# Patient Record
Sex: Female | Born: 1935
Health system: Southern US, Community
[De-identification: ages and names within clinical notes are randomized; demographics above are authoritative.]

## PROBLEM LIST (undated history)

## (undated) DIAGNOSIS — K219 Gastro-esophageal reflux disease without esophagitis: Secondary | ICD-10-CM

## (undated) DIAGNOSIS — F329 Major depressive disorder, single episode, unspecified: Secondary | ICD-10-CM

## (undated) DIAGNOSIS — R0600 Dyspnea, unspecified: Secondary | ICD-10-CM

## (undated) DIAGNOSIS — I639 Cerebral infarction, unspecified: Secondary | ICD-10-CM

## (undated) DIAGNOSIS — Z974 Presence of external hearing-aid: Secondary | ICD-10-CM

## (undated) DIAGNOSIS — F32A Depression, unspecified: Secondary | ICD-10-CM

## (undated) DIAGNOSIS — I5189 Other ill-defined heart diseases: Secondary | ICD-10-CM

## (undated) DIAGNOSIS — H9190 Unspecified hearing loss, unspecified ear: Secondary | ICD-10-CM

## (undated) DIAGNOSIS — J189 Pneumonia, unspecified organism: Secondary | ICD-10-CM

## (undated) DIAGNOSIS — M1990 Unspecified osteoarthritis, unspecified site: Secondary | ICD-10-CM

## (undated) DIAGNOSIS — I509 Heart failure, unspecified: Secondary | ICD-10-CM

## (undated) DIAGNOSIS — F419 Anxiety disorder, unspecified: Secondary | ICD-10-CM

## (undated) DIAGNOSIS — I10 Essential (primary) hypertension: Secondary | ICD-10-CM

## (undated) DIAGNOSIS — I499 Cardiac arrhythmia, unspecified: Secondary | ICD-10-CM

## (undated) DIAGNOSIS — E785 Hyperlipidemia, unspecified: Secondary | ICD-10-CM

## (undated) DIAGNOSIS — Z87442 Personal history of urinary calculi: Secondary | ICD-10-CM

## (undated) DIAGNOSIS — R011 Cardiac murmur, unspecified: Secondary | ICD-10-CM

## (undated) HISTORY — PX: JOINT REPLACEMENT: SHX530

## (undated) HISTORY — DX: Other ill-defined heart diseases: I51.89

## (undated) HISTORY — PX: SHOULDER SURGERY: SHX246

## (undated) HISTORY — DX: Hyperlipidemia, unspecified: E78.5

## (undated) HISTORY — DX: Essential (primary) hypertension: I10

## (undated) HISTORY — DX: Heart failure, unspecified: I50.9

## (undated) HISTORY — PX: OTHER SURGICAL HISTORY: SHX169

## (undated) HISTORY — PX: APPENDECTOMY: SHX54

## (undated) HISTORY — DX: Dyspnea, unspecified: R06.00

## (undated) HISTORY — DX: Unspecified osteoarthritis, unspecified site: M19.90

---

## 1999-02-05 ENCOUNTER — Encounter: Admission: RE | Admit: 1999-02-05 | Discharge: 1999-03-09 | Payer: Self-pay | Admitting: Orthopedic Surgery

## 2000-08-03 ENCOUNTER — Encounter: Admission: RE | Admit: 2000-08-03 | Discharge: 2000-08-04 | Payer: Self-pay | Admitting: Orthopedic Surgery

## 2000-10-19 ENCOUNTER — Encounter: Admission: RE | Admit: 2000-10-19 | Discharge: 2000-10-21 | Payer: Self-pay | Admitting: Orthopedic Surgery

## 2000-11-21 ENCOUNTER — Encounter: Admission: RE | Admit: 2000-11-21 | Discharge: 2000-11-24 | Payer: Self-pay | Admitting: Orthopedic Surgery

## 2001-05-09 ENCOUNTER — Ambulatory Visit (HOSPITAL_COMMUNITY): Admission: RE | Admit: 2001-05-09 | Discharge: 2001-05-09 | Payer: Self-pay | Admitting: Gastroenterology

## 2003-05-29 ENCOUNTER — Encounter: Admission: RE | Admit: 2003-05-29 | Discharge: 2003-05-29 | Payer: Self-pay | Admitting: Urology

## 2003-05-29 ENCOUNTER — Encounter: Payer: Self-pay | Admitting: Urology

## 2003-05-30 ENCOUNTER — Ambulatory Visit (HOSPITAL_COMMUNITY): Admission: RE | Admit: 2003-05-30 | Discharge: 2003-05-30 | Payer: Self-pay | Admitting: Urology

## 2003-05-30 ENCOUNTER — Encounter (INDEPENDENT_AMBULATORY_CARE_PROVIDER_SITE_OTHER): Payer: Self-pay | Admitting: Specialist

## 2003-05-30 ENCOUNTER — Ambulatory Visit (HOSPITAL_BASED_OUTPATIENT_CLINIC_OR_DEPARTMENT_OTHER): Admission: RE | Admit: 2003-05-30 | Discharge: 2003-05-30 | Payer: Self-pay | Admitting: Urology

## 2004-10-21 ENCOUNTER — Ambulatory Visit (HOSPITAL_COMMUNITY): Admission: RE | Admit: 2004-10-21 | Discharge: 2004-10-21 | Payer: Self-pay | Admitting: Gastroenterology

## 2007-04-21 ENCOUNTER — Encounter: Admission: RE | Admit: 2007-04-21 | Discharge: 2007-04-21 | Payer: Self-pay | Admitting: Urology

## 2007-04-25 ENCOUNTER — Ambulatory Visit (HOSPITAL_BASED_OUTPATIENT_CLINIC_OR_DEPARTMENT_OTHER): Admission: RE | Admit: 2007-04-25 | Discharge: 2007-04-25 | Payer: Self-pay | Admitting: Urology

## 2007-05-27 ENCOUNTER — Emergency Department (HOSPITAL_COMMUNITY): Admission: EM | Admit: 2007-05-27 | Discharge: 2007-05-28 | Payer: Self-pay | Admitting: Emergency Medicine

## 2007-10-31 ENCOUNTER — Ambulatory Visit (HOSPITAL_BASED_OUTPATIENT_CLINIC_OR_DEPARTMENT_OTHER): Admission: RE | Admit: 2007-10-31 | Discharge: 2007-10-31 | Payer: Self-pay | Admitting: Urology

## 2007-11-14 ENCOUNTER — Ambulatory Visit (HOSPITAL_BASED_OUTPATIENT_CLINIC_OR_DEPARTMENT_OTHER): Admission: RE | Admit: 2007-11-14 | Discharge: 2007-11-14 | Payer: Self-pay | Admitting: Urology

## 2008-04-11 ENCOUNTER — Other Ambulatory Visit: Admission: RE | Admit: 2008-04-11 | Discharge: 2008-04-11 | Payer: Self-pay | Admitting: Interventional Radiology

## 2008-04-11 ENCOUNTER — Encounter (INDEPENDENT_AMBULATORY_CARE_PROVIDER_SITE_OTHER): Payer: Self-pay | Admitting: Interventional Radiology

## 2008-04-11 ENCOUNTER — Encounter: Admission: RE | Admit: 2008-04-11 | Discharge: 2008-04-11 | Payer: Self-pay | Admitting: Internal Medicine

## 2008-05-16 ENCOUNTER — Encounter: Admission: RE | Admit: 2008-05-16 | Discharge: 2008-05-16 | Payer: Self-pay | Admitting: Orthopedic Surgery

## 2008-05-20 ENCOUNTER — Ambulatory Visit (HOSPITAL_BASED_OUTPATIENT_CLINIC_OR_DEPARTMENT_OTHER): Admission: RE | Admit: 2008-05-20 | Discharge: 2008-05-20 | Payer: Self-pay | Admitting: Orthopedic Surgery

## 2009-07-29 ENCOUNTER — Encounter: Admission: RE | Admit: 2009-07-29 | Discharge: 2009-08-21 | Payer: Self-pay | Admitting: Podiatry

## 2010-05-15 ENCOUNTER — Ambulatory Visit: Payer: Self-pay | Admitting: Cardiovascular Disease

## 2010-11-20 ENCOUNTER — Ambulatory Visit: Payer: Self-pay | Admitting: Cardiovascular Disease

## 2011-01-19 NOTE — Op Note (Signed)
[name], [name]              ACCOUNT NO.:  [id number]   MEDICAL RECORD NO.:  [id number]          PATIENT TYPE:  AMB   LOCATION:  [facility]                         FACILITY:  [facility]   PHYSICIAN:  Martina Sinner, MD DATE OF BIRTH:  1928-05-29   DATE OF PROCEDURE:  04/25/2007  DATE OF DISCHARGE:                               OPERATIVE REPORT   PRE AND POSTOPERATIVE DIAGNOSIS:  Stress incontinence.   SURGERY:  Sling cystourethropexy plus cystoscopy.   Carrie Best was prepped and draped in usual fashion.  She was given  preoperative antibiotics.  Extra care was taken to position her legs to  minimize compartment syndrome, neuropathy and DVT risks.   Two 1 cm incisions were made one fingerbreadth above the symphysis  pubis, 1.5 cm lateral to the midline.  I made a 2 cm incision overlying  mid urethra.  7 mL of epinephrine lidocaine were used for a thick flap  and improve hemostasis.  I made a nice thick incision.  I dissected  sharply to urethrovesical angle bilaterally.   With the bladder emptied, I passed a SPARC needle on top of and then  along the back of the symphysis pubis, parallel to the midline on the  pulp of my left index finger bilaterally.  I then cystoscoped the  patient.  There is efflux of indigo carmine from both ureteral orifices.  There is no bladder or urethral injury.  There is no deflection of the  bladder with movement of the trocar.   With the bladder empty I then attached the sheath and brought up the  sheath through the retropubic space.  I tensioned nicely over the fat  part of a medium-size Kelly clamp.  I cut below the blue dot to irrigate  the sheath and removed the sheaths.  I was very happy with the mid  urethral position.  There is hypermobility of the sling in the midline  as well as laterally.  It was not under tension.  After irrigating I  closed the anterior vaginal wall incision with running 2-0 Vicryl  followed by a second suture.  I  closed the abdominal incisions with  subcuticular 4-0 Vicryl and Dermabond.   [name] will get a trial of voiding.  Hopefully her urge symptoms  will settle down postoperatively.           ______________________________  Martina Sinner, MD  Electronically Signed     SAM/MEDQ  D:  04/25/2007  T:  04/26/2007  Job:  [id number]

## 2011-01-19 NOTE — Op Note (Signed)
NAMEJENESE, MISCHKE              ACCOUNT NO.:  [id number]   MEDICAL RECORD NO.:  [id number]          PATIENT TYPE:  AMB   LOCATION:  [facility]                         FACILITY:  [facility]   PHYSICIAN:  Martina Sinner, MD DATE OF BIRTH:  1927/11/11   DATE OF PROCEDURE:  DATE OF DISCHARGE:                               OPERATIVE REPORT   PREOPERATIVE DIAGNOSIS:  Refractory urge incontinence.   POSTOPERATIVE DIAGNOSIS:  Refractory urge incontinence.   SURGERY:  InterStim stage II placement of generator.   SURGEON:  Martina Sinner, MD   ASSISTANT:  Dr. Delman Kitten.   HISTORY:  Ariba Lehnen has had reasonable results from her InterStim  stage I.  I think she has gone from urge incontinence with foot on the  floor syndrome to a lot of urgency but she is making it in time.  We  were going to remove the lead but over the last 2 weeks we have decided  to place the generator.  I am concerned that she is having some memory  issues.   The patient was prepped and draped in the usual fashion.  A 10 minute  scrub was used.  Because of allergies I gave IV vancomycin.   The 2.5 cm incision was opened a total of approximately 4.5 to 5 cm.  Extra care was taken not to cut the lead entering the pocket.  The lead  connector with lead was removed from the wound.  Silk sutures were cut.  Boot was removed and the lead connector was disconnected from the lead.  We cut the lead and removed it from the body not affecting our sterile  field.   The lead was cleaned and connected to the Medtronic generator.  Screwdriver was utilized.  The tip of the lead was appropriately placed  far enough near the generator.  The generator easily fit into the pocket  that was made a little bit larger with finger dissection.  It was  approximately 1 cm or greater in depth.   The wound was irrigated with antibiotics.  We closed the subcutaneous  tissue with running 3-0 Vicryl careful not to pick up the lead.  The  skin was closed with 4-0 subcuticular and Dermabond.   Hopefully the procedure will improve Ms. Jeancharles's refractory urge  incontinence.           ______________________________  Martina Sinner, MD  Electronically Signed    SAM/MEDQ  D:  11/14/2007  T:  11/15/2007  Job:  [id number]

## 2011-01-19 NOTE — Op Note (Signed)
Carrie Best              ACCOUNT NO.:  [id number]   MEDICAL RECORD NO.:  [id number]          PATIENT TYPE:  AMB   LOCATION:  [facility]                         FACILITY:  [facility]   PHYSICIAN:  Martina Sinner, MD DATE OF BIRTH:  25-Jul-1928   DATE OF PROCEDURE:  DATE OF DISCHARGE:                               OPERATIVE REPORT   SURGEON:  Martina Sinner, MD   ASSISTANT:  Jaspreet Joneja   DIAGNOSIS:  Refractory urge incontinence.   PROCEDURE:  Placement of InterStim stage 1.   INDICATION:  Carrie Best has refractory urge incontinence.   DESCRIPTION OF PROCEDURE:  The patient was prepped and draped in the  usual prone position following a 10-minute scrub.  She was given  preoperative antibiotics.  She was given ciprofloxacin since she is  allergic to PENICILLIN.  Preoperative lab tests were normal.  Foley  catheter was inserted.   I measured 9 cm from the tip of the coccyx and then 1.5 cm lateral to  mark the approximate position of S3.  We used a lidocaine-Marcaine  mixture and used approximately 23 ml for the entire case.  A 3.5 foramen  needle was used to easily find the S3 foramina on the right.  Initially,  I found S4 and was very happy with the S3 position.  She had a good  bellows response.   A 3.49foramen needle was changed for the guide wire under fluoroscopic  control.  A 1 cm incision was made in the skin.  The wide trocar was  inserted fluoroscopically to the correct position with the T-marker at  the bone table.  Inner sheath was removed.  She did have bleeding of  dark blood through the sheath which is not uncommon.  This clinically  did not appear to be a problem during the case.  To remove the inner  core of the white needle, I passed the lead down through this sheath to  the midposition between the two white markers.  We then checked all  positions.  I floated the lead backward a little bit since the nerve was  obviously very superficial.  With  an excellent bellows at lead position  3 and 4, and I was confident she was going to get a good response at  lead 2 and 3, and I am confident that lead 1 will also work.  I do not  think we are going to get a response at lead 0.   I made a 2.5 cm incision approximately 1 inch below the right iliac  crest or 1-1/2 inches below the right iliac crest.  We made a  subcutaneous pocket.  We used a needle passer trocar to deliver the  guide wire out to the right buttocks incision.  We connected the boot  and the lead connector and used a screwdriver appropriately.  We used 2-  0 silk in the usual fashion.  We then brought the lead connector  medially and across the midline to the patient's left side.  The S3  foramina had been found on the right side.  More anesthetic  was  utilized, and the tip of the trocar was brought out.  The lead connector  was passed out through that incision.  Irrigation with antibiotics was  utilized for both incisions in the puncture area.  Closed the right  buttocks incision with 3-0 Vicryl in the subcutaneous tissue followed  by 4-0 Vicryl subcuticular.  Two interrupted 4-0 Vicryl were used for  the midline incision.  Dermabond and telfa dressing was applied.  I was  pleased with Carrie Best position.  Hard copies were taken.  The lead on  the AP view was rather straight but clinically I hope it is going to do  very well for her.           ______________________________  Martina Sinner, MD  Electronically Signed     SAM/MEDQ  D:  10/31/2007  T:  10/31/2007  Job:  [id number]

## 2011-01-19 NOTE — Op Note (Signed)
Carrie Best, Carrie Best              ACCOUNT NO.:  [id number]   MEDICAL RECORD NO.:  [id number]          PATIENT TYPE:  AMB   LOCATION:  [facility]                          FACILITY:  [facility]   PHYSICIAN:  Feliberto Gottron. Turner Daniels, M.D.   DATE OF BIRTH:  October 31, 1927   DATE OF PROCEDURE:  05/20/2008  DATE OF DISCHARGE:                               OPERATIVE REPORT   PREOPERATIVE DIAGNOSIS:  Right knee medial meniscal tear.   POSTOPERATIVE DIAGNOSIS:  Right knee medial meniscal tear.   PROCEDURE:  Right knee partial arthroscopic medial meniscectomy.   SURGEON:  Feliberto Gottron.  Turner Daniels, MD   FIRST ASSISTANT:  None.   ANESTHETIC:  General LMA.   ESTIMATED BLOOD LOSS:  Minimal.   FLUID REPLACEMENT:  800 mL of crystalloid.   DRAINS PLACED:  None.   INDICATIONS FOR PROCEDURE:  A 77 year old woman with symptomatic  catching, popping and pain along the medial aspect of her right knee.  Because of persistent catching, popping and pain, She desires elective  arthroscopic evaluation and treatment of her right knee.  Risks and  benefits of the surgery were discussed and questions answered.   DESCRIPTION OF PROCEDURE:  The patient identified by armband and was  taken to the operating room at [facility] where the  appropriate anesthetic monitors were attached and general LMA anesthesia  induced with the patient in supine position.  Lateral post was applied  to the table and the right lower extremity prepped and draped in usual  sterile fashion from the ankle to the midthigh.  Standard time-out  procedure was performed.  She did receive a dose of Ancef  preoperatively.  Using a #11 blade to half a depth, inferomedial and  inferolateral parapatellar portals were made allowing introduction of  the arthroscope through the inferolateral portal and the outflow through  the inferomedial portal.  Diagnostic arthroscopy was undertaken.  The  suprapatellar pouch, patella and trochlea were in good condition.  Slight  grade 2 chondromalacia to the patella was noted.  The trochlea was in  good condition.  Moving into the medial compartment,  the patient had a  complex multiplane tear of the posterior horn of the medial meniscus  which easily flipped in and out of the joint.  This was debrided back to  a stable margin using a 3.5 Gator sucker shaver as well as straight  biters.  The articular cartilage on the medial side had some very mild  grade 2 chondromalacia.  Moving into the notch, the ACL and the PCL were  intact and on the lateral side, the lateral meniscus was in good  condition as was the articular cartilage.  The gutters were cleared  medially and  laterally.  The knee was irrigated out with normal saline solution.  The  arthroscopic instruments removed and a dressing of Xeroform, 4x4  dressing sponges, Webril, and Ace wrap applied.  The patient was then  awakened and taken to the recovery room without difficulty.      Feliberto Gottron. Turner Daniels, M.D.  Electronically Signed     FJR/MEDQ  D:  05/20/2008  T:  05/21/2008  Job:  [id number]

## 2011-01-22 NOTE — Procedures (Signed)
[facility]  Patient:    Carrie Best, Carrie Best Visit Number: [id number] MRN: [id number]          Service Type: END Location: ENDO Attending Physician:  Louie Bun Proc. Date: 05/09/01 Admit Date:  05/09/2001   CC:         Ravi R. Felipa Eth, M.D.   Procedure Report  PROCEDURE:  Colonoscopy.  INDICATIONS FOR PROCEDURE:  Screening colonoscopy requested by primary care physician.  DESCRIPTION OF PROCEDURE:  The patient was placed in the left lateral decubitus position then placed on the pulse monitor with continuous low flow oxygen delivered by nasal cannula. She was sedated with 60 mg IV Demerol and 6 mg IV Versed. The Olympus video colonoscope was inserted into the rectum and advanced to the cecum, confirmed by transillumination at McBurneys point and visualization of the ileocecal valve and appendiceal orifice. The prep was excellent. The cecum, ascending, transverse, descending and sigmoid colon all appeared normal with no masses, polyps, diverticula or other mucosal abnormalities. The rectum likewise appeared normal and retroflexed view of the anus revealed some small internal hemorrhoids. The colonoscope was then withdrawn and the patient returned to the recovery room in stable condition. The patient tolerated the procedure well and there were no immediate complications.  IMPRESSION:  Internal hemorrhoids otherwise normal colonoscopy.  PLAN:  Hemoccult beginning in five years and if negative flexible sigmoidoscopy every five years. Consider repeat colonoscopy in 10 years. Attending Physician:  Louie Bun DD:  05/09/01 TD:  05/09/01 Job: [id number]

## 2011-01-22 NOTE — Op Note (Signed)
   [name], [name]                          ACCOUNT NO.:  [id number]   MEDICAL RECORD NO.:  [id number]                   PATIENT TYPE:  AMB   LOCATION:  [facility]                                 FACILITY:  [facility]   PHYSICIAN:  Claudette Laws, M.D.               DATE OF BIRTH:  05/29/1928   DATE OF PROCEDURE:  05/30/2003  DATE OF DISCHARGE:                                 OPERATIVE REPORT   PREOPERATIVE DIAGNOSES:  1. Hematuria with sessile lesion at the right ureteral orifice.  2. History of positive urine cytology.   POSTOPERATIVE DIAGNOSES:  1. Hematuria with sessile lesion at the right ureteral orifice.  2. History of positive urine cytology.   OPERATION/PROCEDURE:  1. Cystoscopy.  2. Cold cup biopsy.  3. Bladder lesion.  4. Attempted retrograde pyelogram.   SURGEON:  Claudette Laws, M.D.   DESCRIPTION OF PROCEDURE:  The patient was prepped and draped in the in the  dorsal lithotomy position under LMA anesthesia.  Cystoscopy was performed  with a 22-French rigid cystoscope.  The bladder was inspected carefully with  the 12-degree and 70-degree lens.  Again she had what appeared to be a  sessile submucosal lesion surrounding and proximal to the right ureteral  orifice.  There were also some dystrophic light calcifications overlying the  ureteral orifice, more medially.  The left ureteral orifice was normal.  The  rest of the bladder looked unremarkable, smooth, no obvious tumors, no  calculi.   Initially we attempted to intubate what we thought was the right ureteral  orifice; however, never could we get up a 4-French whistle-tip catheter or  glide wire.  Several attempts were made before we gave up.  The patient did  have an IVP preoperatively.  However, this did not show any obvious  obstruction.   At this point then we reverted to the cold cup biopsies and took multiple  deep bites around the ureteral orifice.  At the conclusion, this area was  fulgurated with the  Bugbee electrode until the area was clear.  A 18-French,  5 mL  Foley catheter was hooked to straight drain.  On bimanual exam, however, I  could not feel any obvious mass or bladder lesion.   The plan now is to keep her catheter in overnight and take it out tomorrow.                                                  Claudette Laws, M.D.    RFS/MEDQ  D:  05/30/2003  T:  05/30/2003  Job:  [id number]

## 2011-01-22 NOTE — Op Note (Signed)
Carrie Best, BRICK              ACCOUNT NO.:  [id number]   MEDICAL RECORD NO.:  [id number]          PATIENT TYPE:  AMB   LOCATION:  ENDO                         FACILITY:  [facility]   PHYSICIAN:  John C. Madilyn Fireman, M.D.    DATE OF BIRTH:  02/18/1928   DATE OF PROCEDURE:  10/21/2004  DATE OF DISCHARGE:                                 OPERATIVE REPORT   PROCEDURE:  Esophagogastroduodenoscopy with esophageal dilatation.   INDICATIONS FOR PROCEDURE:  Solid food dysphagia.   PROCEDURE:  The patient was placed in the left lateral decubitus position  and placed on the pulse monitor with continuous low-flow oxygen delivered by  nasal cannula.  She was sedated with 75 mcg IV fentanyl and 6 mg IV Versed.  The Olympus video endoscope was advanced under direct vision into the  oropharynx and esophagus.  The esophagus was straight and of normal caliber.  The squamocolumnar line at 38 cm.  There were one or two small focal  erosions with exudate right at the GE junction and although not initially  appreciated, during the procedure, on withdrawal of the scope, the LES,  dilated revealing a widely patent and lower esophageal ring.  There was no  resistance to passage of the scope into the stomach.  Retroflexed view of  the cardia revealed no obvious abnormality.  The fundus, body and pylorus  all appeared normal.  The duodenum was entered and both the bulb and second  portion were well inspected and appeared to be within normal limits.  The  Savary guide wire was placed through the endoscope channel, and the scope  withdrawn.  A single, 16 mm Savary guide wire was passed over the guide wire  with minimal resistance and no blood seen on withdrawal.  The dilator was  removed together with the wire, and the patient returned to the recovery  room in stable condition.  She tolerated the procedure well and there were  no immediate complications.   IMPRESSION:  1.  Mild esophagitis.  2.  Patent lower  esophageal ring, status post dilatation to 16 mm.   PLAN:  Will treat a proton pump inhibitor for at least two months and  advance diet and observe response to dilatation.      JCH/MEDQ  D:  10/21/2004  T:  10/21/2004  Job:  [id number]   cc:   Larina Earthly, M.D.  [address]  [place]  [place] [address]  Fax: [phone]

## 2011-05-28 LAB — I-STAT 8, (EC8 V) (CONVERTED LAB)
BUN: 8
Bicarbonate: 28.9 — ABNORMAL HIGH
Glucose, Bld: 110 — ABNORMAL HIGH
Hemoglobin: 13.9
Sodium: 139
pCO2, Ven: 46.6
pH, Ven: 7.399 — ABNORMAL HIGH

## 2011-05-31 LAB — POCT I-STAT 4, (NA,K, GLUC, HGB,HCT)
Glucose, Bld: 103 — ABNORMAL HIGH
Hemoglobin: 14.6
Potassium: 3.5
Sodium: 141

## 2011-06-09 LAB — BASIC METABOLIC PANEL
BUN: 15
Chloride: 97
Creatinine, Ser: 0.98

## 2011-06-09 LAB — POCT HEMOGLOBIN-HEMACUE: Hemoglobin: 14.4

## 2011-06-17 LAB — CBC
HCT: 37.7
MCV: 87.9
Platelets: 267
RDW: 13.6

## 2011-06-17 LAB — DIFFERENTIAL
Basophils Absolute: 0.1
Eosinophils Absolute: 0.4
Eosinophils Relative: 4
Lymphocytes Relative: 36

## 2011-06-18 LAB — TYPE AND SCREEN
ABO/RH(D): O POS
Antibody Screen: NEGATIVE

## 2011-06-18 LAB — BASIC METABOLIC PANEL
Calcium: 10.2
Creatinine, Ser: 1.02
GFR calc Af Amer: >60

## 2011-06-18 LAB — PROTIME-INR
INR: 0.9
Prothrombin Time: 12.6

## 2011-06-18 LAB — CBC
RBC: 4.61
WBC: 11.8 — ABNORMAL HIGH

## 2011-06-18 LAB — ABO/RH: ABO/RH(D): O POS

## 2011-12-29 ENCOUNTER — Other Ambulatory Visit: Payer: Self-pay | Admitting: Dermatology

## 2012-01-31 ENCOUNTER — Emergency Department (HOSPITAL_COMMUNITY)
Admission: EM | Admit: 2012-01-31 | Discharge: 2012-02-01 | Disposition: A | Discharge status: Home / Self Care | Payer: Medicare Other | Attending: Emergency Medicine | Admitting: Emergency Medicine

## 2012-01-31 ENCOUNTER — Encounter (HOSPITAL_COMMUNITY): Payer: Self-pay | Admitting: Emergency Medicine

## 2012-01-31 ENCOUNTER — Emergency Department (HOSPITAL_COMMUNITY): Payer: Medicare Other

## 2012-01-31 DIAGNOSIS — N390 Urinary tract infection, site not specified: Secondary | ICD-10-CM | POA: Insufficient documentation

## 2012-01-31 DIAGNOSIS — G319 Degenerative disease of nervous system, unspecified: Secondary | ICD-10-CM | POA: Insufficient documentation

## 2012-01-31 DIAGNOSIS — M25529 Pain in unspecified elbow: Secondary | ICD-10-CM | POA: Insufficient documentation

## 2012-01-31 DIAGNOSIS — I10 Essential (primary) hypertension: Secondary | ICD-10-CM | POA: Insufficient documentation

## 2012-01-31 DIAGNOSIS — S5000XA Contusion of unspecified elbow, initial encounter: Secondary | ICD-10-CM | POA: Insufficient documentation

## 2012-01-31 DIAGNOSIS — Y92009 Unspecified place in unspecified non-institutional (private) residence as the place of occurrence of the external cause: Secondary | ICD-10-CM | POA: Insufficient documentation

## 2012-01-31 DIAGNOSIS — I498 Other specified cardiac arrhythmias: Secondary | ICD-10-CM | POA: Insufficient documentation

## 2012-01-31 DIAGNOSIS — I509 Heart failure, unspecified: Secondary | ICD-10-CM | POA: Insufficient documentation

## 2012-01-31 DIAGNOSIS — W1809XA Striking against other object with subsequent fall, initial encounter: Secondary | ICD-10-CM | POA: Insufficient documentation

## 2012-01-31 DIAGNOSIS — S40029A Contusion of unspecified upper arm, initial encounter: Secondary | ICD-10-CM | POA: Insufficient documentation

## 2012-01-31 DIAGNOSIS — W19XXXA Unspecified fall, initial encounter: Secondary | ICD-10-CM

## 2012-01-31 DIAGNOSIS — S51009A Unspecified open wound of unspecified elbow, initial encounter: Secondary | ICD-10-CM | POA: Insufficient documentation

## 2012-01-31 DIAGNOSIS — E876 Hypokalemia: Secondary | ICD-10-CM | POA: Insufficient documentation

## 2012-01-31 DIAGNOSIS — F29 Unspecified psychosis not due to a substance or known physiological condition: Secondary | ICD-10-CM | POA: Insufficient documentation

## 2012-01-31 LAB — GLUCOSE, CAPILLARY

## 2012-01-31 NOTE — ED Provider Notes (Signed)
History     CSN: [id number]  Arrival date & time 01/31/12  1944   First MD Initiated Contact with Patient 01/31/12 2255      Chief Complaint  Patient presents with  . Fall    (Consider location/radiation/quality/duration/timing/severity/associated sxs/prior treatment) Patient is a 78 y.o. female presenting with fall. The history is provided by the patient and a relative. No language interpreter was used.  Fall The accident occurred 6 to 12 hours ago. The fall occurred while walking. She fell from a height of 1 to 2 ft. She landed on carpet. There was no blood loss. The point of impact was the head (right elbow). The pain is present in the head and right elbow. The pain is mild. She was ambulatory at the scene. There was no entrapment after the fall. There was no drug use involved in the accident. There was no alcohol use involved in the accident. Pertinent negatives include no visual change, no fever, no numbness, no abdominal pain, no bowel incontinence, no nausea, no vomiting, no headaches, no hearing loss and no loss of consciousness.    Past Medical History  Diagnosis Date  . Hypertension   . Arthritis   . Dyspnea   . Diastolic dysfunction   . Hyperlipidemia   . Congestive heart failure     Congestive heart failure symptoms    Past Surgical History  Procedure Date  . Appendectomy   . Other surgical history     Bladder Surgery    Family History  Problem Relation Age of Onset  . Stroke Mother   . Stroke Brother     History  Substance Use Topics  . Smoking status: Never Smoker   . Smokeless tobacco: Not on file  . Alcohol Use: No    OB History    Grav Para Term Preterm Abortions TAB SAB Ect Mult Living                  Review of Systems  Constitutional: Negative for fever, activity change, appetite change and fatigue.  HENT: Negative for congestion, sore throat, rhinorrhea, neck pain and neck stiffness.   Respiratory: Negative for cough and shortness of  breath.   Cardiovascular: Negative for chest pain and palpitations.  Gastrointestinal: Negative for nausea, vomiting, abdominal pain and bowel incontinence.  Genitourinary: Negative for dysuria, urgency, frequency and flank pain.  Musculoskeletal: Positive for arthralgias. Negative for myalgias and back pain.  Skin: Positive for wound.  Neurological: Negative for dizziness, loss of consciousness, weakness, light-headedness, numbness and headaches.  All other systems reviewed and are negative.    Allergies  Review of patient's allergies indicates no known allergies.  Home Medications   Current Outpatient Rx  Name Route Sig Dispense Refill  . ASPIRIN 81 MG PO TABS Oral Take 81 mg by mouth daily.    . DULOXETINE HCL 60 MG PO CPEP Oral Take 60 mg by mouth daily.    Marland Kitchen FELODIPINE ER 5 MG PO TB24 Oral Take 5 mg by mouth daily.    . OMEGA-3 FATTY ACIDS 1000 MG PO CAPS Oral Take 1 g by mouth daily.    Marland Kitchen RANITIDINE HCL 150 MG PO CAPS Oral Take 150 mg by mouth daily as needed. Reflux    . ZOLPIDEM TARTRATE 10 MG PO TABS Oral Take 10 mg by mouth at bedtime as needed.    . CEPHALEXIN 500 MG PO CAPS Oral Take 1 capsule (500 mg total) by mouth 4 (four) times daily. 28 capsule  0    BP 167/65  Pulse 54  Temp 98.3 F (36.8 C)  Resp 16  Wt 200 lb (90.719 kg)  SpO2 100%  Physical Exam  Nursing note and vitals reviewed. Constitutional: She is oriented to person, place, and time. She appears well-developed and well-nourished. No distress.  HENT:  Head: Normocephalic and atraumatic.  Mouth/Throat: Oropharynx is clear and moist.  Eyes: Conjunctivae and EOM are normal. Pupils are equal, round, and reactive to light.  Neck: Normal range of motion. Neck supple.  Cardiovascular: Regular rhythm, normal heart sounds and intact distal pulses.  Exam reveals no gallop and no friction rub.   No murmur heard.      Slightly bradycardic rate  Pulmonary/Chest: Effort normal and breath sounds normal. No  respiratory distress. She exhibits no tenderness.  Abdominal: Soft. Bowel sounds are normal. There is no tenderness. There is no rebound and no guarding.  Musculoskeletal: Normal range of motion.       Swelling and ecchymosis to the R elbow and forearm.    Neurological: She is alert and oriented to person, place, and time. No cranial nerve deficit.  Skin: Skin is warm and dry.       Small skin tear to the R elbow    ED Course  Procedures (including critical care time)   Date: 01/31/2012  Rate: 58  Rhythm: sinus bradycardia  QRS Axis: normal  Intervals: normal  ST/T Wave abnormalities: normal  Conduction Disutrbances:none  Narrative Interpretation:   Old EKG Reviewed: unchanged  Labs Reviewed  DIFFERENTIAL - Abnormal; Notable for the following:    Lymphs Abs 4.2 (*)    Eosinophils Relative 6 (*)    All other components within normal limits  BASIC METABOLIC PANEL - Abnormal; Notable for the following:    Potassium 3.2 (*)    Glucose, Bld 107 (*)    Creatinine, Ser 1.19 (*)    GFR calc non Af Amer 41 (*)    GFR calc Af Amer 48 (*)    All other components within normal limits  URINALYSIS, ROUTINE W REFLEX MICROSCOPIC - Abnormal; Notable for the following:    Color, Urine RED (*) BIOCHEMICALS MAY BE AFFECTED BY COLOR   APPearance TURBID (*)    Hgb urine dipstick LARGE (*)    Protein, ur 100 (*)    Leukocytes, UA LARGE (*)    All other components within normal limits  URINE MICROSCOPIC-ADD ON - Abnormal; Notable for the following:    Squamous Epithelial / LPF FEW (*)    Bacteria, UA MANY (*)    All other components within normal limits  CBC  GLUCOSE, CAPILLARY   Dg Elbow Complete Right  01/31/2012  *RADIOLOGY REPORT*  Clinical Data: Fall, pain  RIGHT ELBOW - COMPLETE 3+ VIEW  Comparison:  None.  Findings:  There is no evidence of fracture, dislocation, or joint effusion.  There is no evidence of arthropathy or other focal bone abnormality.  Soft tissues are unremarkable.   IMPRESSION: Negative.  Original Report Authenticated By: Elsie Stain, M.D.   Dg Forearm Right  01/31/2012  *RADIOLOGY REPORT*  Clinical Data: Fall, pain  RIGHT FOREARM - 2 VIEW  Comparison: None.  Findings: There is no evidence of fracture or dislocation.  There is no evidence of arthropathy or other focal bony abnormality. Soft tissues are unremarkable.  IMPRESSION: No acute osseous abnormality.  Original Report Authenticated By: Elsie Stain, M.D.   Ct Head Wo Contrast  01/31/2012  *RADIOLOGY REPORT*  Clinical Data: Fall, confusion  CT HEAD WITHOUT CONTRAST  Technique:  Contiguous axial images were obtained from the base of the skull through the vertex without contrast.  Comparison: None.  Findings: There is no evidence for acute infarction, intracranial hemorrhage, mass lesion, hydrocephalus, or extra-axial fluid. Moderate to severe atrophy is present.  Advanced chronic microvascular ischemic change is present in the periventricular and subcortical white matter.  Intact calvarium.  No acute sinus or mastoid disease.  Advanced vascular calcification.  IMPRESSION: Atrophy with chronic microvascular ischemic change.  No skull fracture or intracranial hemorrhage.  Original Report Authenticated By: Elsie Stain, M.D.     1. Fall   2. UTI (lower urinary tract infection)   3. Arm contusion       MDM  Fall with negative imaging. She was found to have a urinary tract infection. Received a dose of Rocephin. Her potassium was repleted for slightly low potassium. She will be discharged home instructed to drink oral hydration. Will be prescribed Keflex. Chart and followup with her primary care physician. Provided strict return precautions. Her fall was purely mechanical at no concern about a syncopal event.        Dayton Bailiff, MD 02/01/12 0200

## 2012-01-31 NOTE — ED Notes (Signed)
Family states today around 5pm pt fell and injured her right arm and states she hit her head on the dishwasher  EMS was called but refused transport  Family states after the fall pt could not answer questions appropriately  Family states pt has several falls recently  Pt is alert and oriented x 3 in triage and answers all questions appropriately at this time

## 2012-02-01 LAB — DIFFERENTIAL
Basophils Absolute: 0.1 10*3/uL (ref 0.0–0.1)
Basophils Relative: 1 % (ref 0–1)
Eosinophils Absolute: 0.6 10*3/uL (ref 0.0–0.7)
Monocytes Relative: 9 % (ref 3–12)
Neutro Abs: 4.6 10*3/uL (ref 1.7–7.7)
Neutrophils Relative %: 44 % (ref 43–77)

## 2012-02-01 LAB — URINALYSIS, ROUTINE W REFLEX MICROSCOPIC
Bilirubin Urine: NEGATIVE
Nitrite: NEGATIVE
Specific Gravity, Urine: 1.015 (ref 1.005–1.030)
Urobilinogen, UA: 1 mg/dL (ref 0.0–1.0)
pH: 7 (ref 5.0–8.0)

## 2012-02-01 LAB — CBC
Hemoglobin: 12.8 g/dL (ref 12.0–15.0)
MCH: 30 pg (ref 26.0–34.0)
MCHC: 34.7 g/dL (ref 30.0–36.0)
Platelets: 214 10*3/uL (ref 150–400)
RDW: 13.9 % (ref 11.5–15.5)

## 2012-02-01 LAB — BASIC METABOLIC PANEL
Chloride: 97 mEq/L (ref 96–112)
GFR calc Af Amer: 48 mL/min — ABNORMAL LOW (ref >90)
GFR calc non Af Amer: 41 mL/min — ABNORMAL LOW (ref >90)
Potassium: 3.2 mEq/L — ABNORMAL LOW (ref 3.5–5.1)
Sodium: 138 mEq/L (ref 135–145)

## 2012-02-01 LAB — URINE MICROSCOPIC-ADD ON

## 2012-02-01 MED ORDER — CEPHALEXIN 500 MG PO CAPS: 500.0000 mg | ORAL_CAPSULE | Freq: Four times a day (QID) | ORAL | Status: AC

## 2012-02-01 MED ORDER — SODIUM CHLORIDE 0.9 % IV SOLN
INTRAVENOUS | Status: DC
  Administered 2012-02-01: 01:00:00 via INTRAVENOUS

## 2012-02-01 MED ORDER — DEXTROSE 5 % IV SOLN
1.0000 g | Freq: Once | INTRAVENOUS | Status: AC
  Administered 2012-02-01: 1 g via INTRAVENOUS
  Filled 2012-02-01: qty 10

## 2012-02-01 MED ORDER — POTASSIUM CHLORIDE CRYS ER 20 MEQ PO TBCR
40.0000 meq | EXTENDED_RELEASE_TABLET | Freq: Once | ORAL | Status: AC
  Administered 2012-02-01: 40 meq via ORAL
  Filled 2012-02-01: qty 2

## 2012-02-01 NOTE — Discharge Instructions (Signed)
Contusion A contusion is a deep bruise. Contusions are the result of an injury that caused bleeding under the skin. The contusion may turn blue, purple, or yellow. Minor injuries will give you a painless contusion, but more severe contusions may stay painful and swollen for a few weeks.  CAUSES  A contusion is usually caused by a blow, trauma, or direct force to an area of the body. SYMPTOMS   Swelling and redness of the injured area.   Bruising of the injured area.   Tenderness and soreness of the injured area.   Pain.  DIAGNOSIS  The diagnosis can be made by taking a history and physical exam. An X-ray, CT scan, or MRI may be needed to determine if there were any associated injuries, such as fractures. TREATMENT  Specific treatment will depend on what area of the body was injured. In general, the best treatment for a contusion is resting, icing, elevating, and applying cold compresses to the injured area. Over-the-counter medicines may also be recommended for pain control. Ask your caregiver what the best treatment is for your contusion. HOME CARE INSTRUCTIONS   Put ice on the injured area.   Put ice in a plastic bag.   Place a towel between your skin and the bag.   Leave the ice on for 15 to 20 minutes, 3 to 4 times a day.   Only take over-the-counter or prescription medicines for pain, discomfort, or fever as directed by your caregiver. Your caregiver may recommend avoiding anti-inflammatory medicines (aspirin, ibuprofen, and naproxen) for 48 hours because these medicines may increase bruising.   Rest the injured area.   If possible, elevate the injured area to reduce swelling.  SEEK IMMEDIATE MEDICAL CARE IF:   You have increased bruising or swelling.   You have pain that is getting worse.   Your swelling or pain is not relieved with medicines.  MAKE SURE YOU:   Understand these instructions.   Will watch your condition.   Will get help right away if you are not  doing well or get worse.  Document Released: 06/02/2005 Document Revised: 08/12/2011 Document Reviewed: 06/28/2011 [employer] Patient Information 2012 [employer]  Fall Prevention, Elderly Falls are the leading cause of injuries, accidents, and accidental deaths in people over the age of 65. Falling is a real threat to your ability to live on your own. CAUSES   Poor eyesight or poor hearing can make you more likely to fall.   Illnesses and physical conditions can affect your strength and balance.   Poor lighting, throw rugs and pets in your home can make you more likely to trip or slip.   The side effects of some medicines can upset your balance and lead to falling. These include medicines for depression, sleep problems, high blood pressure, diabetes, and heart conditions.  PREVENTION  Be sure your home is as safe as possible. Here are some tips:  Wear shoes with non-skid soles (not house slippers).   Be sure your home and outside area are well lit.   Use night lights throughout your house, including hallways and stairways.   Remove clutter and clean up spills on floors and walkways.   Remove throw rugs or fasten them to the floor with carpet tape. Tack down carpet edges.   Do not place electrical cords across pathways.   Install grab bars in your bathtub, shower, and toilet area. Towel bars should not be used as a grab bar.   Install handrails on both   sides of stairways.   Do not climb on stools or stepladders. Get someone else to help with jobs that require climbing.   Do not wax your floors at all, or use a non-skid wax.   Repair uneven or unsafe sidewalks, walkways or stairs.   Keep frequently used items within reach.   Be aware of pets so you do not trip.  Get regular check-ups from your doctor, and take good care of yourself:  Have your eyes checked every year for vision changes, cataracts, glaucoma, and other eye problems. Wear eyeglasses as directed.   Have  your hearing checked every 2 years, or anytime you or others think that you cannot hear well. Use hearing aids as directed.   See your caregiver if you have foot pain or corns. Sore feet can contribute to falls.   Let your caregiver know if a medicine is making you feel dizzy or making you lose your balance.   Use a cane, walker, or wheelchair as directed. Use walker or wheelchair brakes when getting in and out.   When you get up from bed, sit on the side of the bed for 1 to 2 minutes before you stand up. This will give your blood pressure time to adjust, and you will feel less dizzy.   If you need to go to the bathroom often, consider using a bedside commode.  Keep your body in good shape:  Get regular exercise, especially walking.   Do exercises to strengthen the muscles you use for walking and lifting.   Do not smoke.   Minimize use of alcohol.  SEEK IMMEDIATE MEDICAL CARE IF:   You feel dizzy, weak, or unsteady on your feet.   You feel confused.   You fall.  Document Released: 08/23/2005 Document Revised: 08/12/2011 Document Reviewed: 02/17/2007 [employer] Patient Information 2012 [employer].  Urinary Tract Infection Infections of the urinary tract can start in several places. A bladder infection (cystitis), a kidney infection (pyelonephritis), and a prostate infection (prostatitis) are different types of urinary tract infections (UTIs). They usually get better if treated with medicines (antibiotics) that kill germs. Take all the medicine until it is gone. You or your child may feel better in a few days, but TAKE ALL MEDICINE or the infection may not respond and may become more difficult to treat. HOME CARE INSTRUCTIONS   Drink enough water and fluids to keep the urine clear or pale yellow. Cranberry juice is especially recommended, in addition to large amounts of water.   Avoid caffeine, tea, and carbonated beverages. They tend to irritate the bladder.   Alcohol may  irritate the prostate.   Only take over-the-counter or prescription medicines for pain, discomfort, or fever as directed by your caregiver.  To prevent further infections:  Empty the bladder often. Avoid holding urine for long periods of time.   After a bowel movement, women should cleanse from front to back. Use each tissue only once.   Empty the bladder before and after sexual intercourse.  FINDING OUT THE RESULTS OF YOUR TEST Not all test results are available during your visit. If your or your child's test results are not back during the visit, make an appointment with your caregiver to find out the results. Do not assume everything is normal if you have not heard from your caregiver or the medical facility. It is important for you to follow up on all test results. SEEK MEDICAL CARE IF:   There is back pain.   Your   baby is older than 3 months with a rectal temperature of 100.5 F (38.1 C) or higher for more than 1 day.   Your or your child's problems (symptoms) are no better in 3 days. Return sooner if you or your child is getting worse.  SEEK IMMEDIATE MEDICAL CARE IF:   There is severe back pain or lower abdominal pain.   You or your child develops chills.   You have a fever.   Your baby is older than 3 months with a rectal temperature of 102 F (38.9 C) or higher.   Your baby is 3 months old or younger with a rectal temperature of 100.4 F (38 C) or higher.   There is nausea or vomiting.   There is continued burning or discomfort with urination.  MAKE SURE YOU:   Understand these instructions.   Will watch your condition.   Will get help right away if you are not doing well or get worse.  Document Released: 06/02/2005 Document Revised: 08/12/2011 Document Reviewed: 01/05/2007 [employer] Patient Information 2012 [employer]. 

## 2012-02-01 NOTE — ED Notes (Signed)
IV team RN at bedside attempting to establish IV access.

## 2012-02-01 NOTE — ED Notes (Signed)
Patient given discharge instructions, information, prescriptions, and diet order. Patient states that they adequately understand discharge information given and to return to ED if symptoms return or worsen.     

## 2012-02-08 ENCOUNTER — Encounter: Payer: Self-pay | Admitting: Cardiovascular Disease

## 2012-02-09 ENCOUNTER — Encounter: Payer: Self-pay | Admitting: Cardiovascular Disease

## 2012-03-18 ENCOUNTER — Inpatient Hospital Stay (HOSPITAL_COMMUNITY)
Admission: EM | Admit: 2012-03-18 | Discharge: 2012-03-23 | DRG: 193 | Disposition: A | Discharge status: Home Health | Payer: Medicare Other | Attending: Internal Medicine | Admitting: Internal Medicine

## 2012-03-18 ENCOUNTER — Encounter (HOSPITAL_COMMUNITY): Payer: Self-pay | Admitting: *Deleted

## 2012-03-18 ENCOUNTER — Emergency Department (HOSPITAL_COMMUNITY): Payer: Medicare Other

## 2012-03-18 DIAGNOSIS — E785 Hyperlipidemia, unspecified: Secondary | ICD-10-CM | POA: Diagnosis present

## 2012-03-18 DIAGNOSIS — J189 Pneumonia, unspecified organism: Principal | ICD-10-CM | POA: Diagnosis present

## 2012-03-18 DIAGNOSIS — I509 Heart failure, unspecified: Secondary | ICD-10-CM | POA: Diagnosis present

## 2012-03-18 DIAGNOSIS — E669 Obesity, unspecified: Secondary | ICD-10-CM | POA: Diagnosis present

## 2012-03-18 DIAGNOSIS — I10 Essential (primary) hypertension: Secondary | ICD-10-CM | POA: Diagnosis present

## 2012-03-18 DIAGNOSIS — R197 Diarrhea, unspecified: Secondary | ICD-10-CM | POA: Diagnosis present

## 2012-03-18 DIAGNOSIS — I5033 Acute on chronic diastolic (congestive) heart failure: Secondary | ICD-10-CM | POA: Diagnosis present

## 2012-03-18 DIAGNOSIS — E876 Hypokalemia: Secondary | ICD-10-CM | POA: Diagnosis present

## 2012-03-18 DIAGNOSIS — K219 Gastro-esophageal reflux disease without esophagitis: Secondary | ICD-10-CM | POA: Diagnosis present

## 2012-03-18 DIAGNOSIS — I482 Chronic atrial fibrillation, unspecified: Secondary | ICD-10-CM | POA: Diagnosis present

## 2012-03-18 DIAGNOSIS — R0902 Hypoxemia: Secondary | ICD-10-CM

## 2012-03-18 DIAGNOSIS — G479 Sleep disorder, unspecified: Secondary | ICD-10-CM | POA: Diagnosis present

## 2012-03-18 DIAGNOSIS — I4891 Unspecified atrial fibrillation: Secondary | ICD-10-CM | POA: Diagnosis present

## 2012-03-18 DIAGNOSIS — R531 Weakness: Secondary | ICD-10-CM

## 2012-03-18 LAB — COMPREHENSIVE METABOLIC PANEL
ALT: 8 U/L (ref 0–35)
Alkaline Phosphatase: 72 U/L (ref 39–117)
BUN: 9 mg/dL (ref 6–23)
CO2: 29 mEq/L (ref 19–32)
Calcium: 9.1 mg/dL (ref 8.4–10.5)
GFR calc Af Amer: 72 mL/min — ABNORMAL LOW (ref >90)
GFR calc non Af Amer: 62 mL/min — ABNORMAL LOW (ref >90)
Glucose, Bld: 113 mg/dL — ABNORMAL HIGH (ref 70–99)
Potassium: 2.4 mEq/L — CL (ref 3.5–5.1)
Sodium: 134 mEq/L — ABNORMAL LOW (ref 135–145)

## 2012-03-18 LAB — POCT I-STAT TROPONIN I

## 2012-03-18 LAB — CBC
HCT: 33.1 % — ABNORMAL LOW (ref 36.0–46.0)
Hemoglobin: 11.6 g/dL — ABNORMAL LOW (ref 12.0–15.0)
MCH: 29.4 pg (ref 26.0–34.0)
MCHC: 35 g/dL (ref 30.0–36.0)
RBC: 3.94 MIL/uL (ref 3.87–5.11)

## 2012-03-18 LAB — PRO B NATRIURETIC PEPTIDE: Pro B Natriuretic peptide (BNP): 2341 pg/mL — ABNORMAL HIGH (ref 0–450)

## 2012-03-18 MED ORDER — METOPROLOL TARTRATE 50 MG PO TABS
75.0000 mg | ORAL_TABLET | Freq: Two times a day (BID) | ORAL | Status: DC
  Administered 2012-03-19 – 2012-03-23 (×10): 75 mg via ORAL
  Filled 2012-03-18 (×11): qty 1

## 2012-03-18 MED ORDER — SIMVASTATIN 40 MG PO TABS
40.0000 mg | ORAL_TABLET | Freq: Every evening | ORAL | Status: DC
  Administered 2012-03-19 (×2): 40 mg via ORAL
  Filled 2012-03-18 (×4): qty 1

## 2012-03-18 MED ORDER — FUROSEMIDE 10 MG/ML IJ SOLN
40.0000 mg | Freq: Once | INTRAMUSCULAR | Status: AC
  Administered 2012-03-18: 40 mg via INTRAVENOUS
  Filled 2012-03-18: qty 4

## 2012-03-18 MED ORDER — ZOLPIDEM TARTRATE 10 MG PO TABS
10.0000 mg | ORAL_TABLET | Freq: Every evening | ORAL | Status: DC | PRN
  Administered 2012-03-19 – 2012-03-20 (×2): 10 mg via ORAL
  Filled 2012-03-18 (×2): qty 1

## 2012-03-18 MED ORDER — DEXTROSE 5 % IV SOLN
2.0000 g | Freq: Two times a day (BID) | INTRAVENOUS | Status: DC
  Administered 2012-03-18 – 2012-03-21 (×7): 2 g via INTRAVENOUS
  Filled 2012-03-18 (×9): qty 2

## 2012-03-18 MED ORDER — FAMOTIDINE 20 MG PO TABS
20.0000 mg | ORAL_TABLET | Freq: Two times a day (BID) | ORAL | Status: DC
  Administered 2012-03-19 – 2012-03-23 (×10): 20 mg via ORAL
  Filled 2012-03-18 (×11): qty 1

## 2012-03-18 MED ORDER — DULOXETINE HCL 60 MG PO CPEP
60.0000 mg | ORAL_CAPSULE | Freq: Every day | ORAL | Status: DC
  Filled 2012-03-18: qty 1

## 2012-03-18 MED ORDER — ACETAMINOPHEN 325 MG PO TABS
650.0000 mg | ORAL_TABLET | Freq: Once | ORAL | Status: AC
  Administered 2012-03-18: 650 mg via ORAL
  Filled 2012-03-18: qty 2

## 2012-03-18 MED ORDER — SODIUM CHLORIDE 0.9 % IV BOLUS (SEPSIS)
500.0000 mL | Freq: Once | INTRAVENOUS | Status: AC
  Administered 2012-03-18: 500 mL via INTRAVENOUS

## 2012-03-18 MED ORDER — OXYCODONE-ACETAMINOPHEN 5-325 MG PO TABS: 1.0000 | ORAL_TABLET | ORAL | Status: DC | PRN

## 2012-03-18 MED ORDER — ASPIRIN 81 MG PO CHEW
81.0000 mg | CHEWABLE_TABLET | Freq: Every day | ORAL | Status: DC
  Administered 2012-03-19 – 2012-03-23 (×6): 81 mg via ORAL
  Filled 2012-03-18 (×6): qty 1

## 2012-03-18 MED ORDER — LISINOPRIL 5 MG PO TABS
5.0000 mg | ORAL_TABLET | Freq: Every day | ORAL | Status: DC
  Administered 2012-03-19: 5 mg via ORAL
  Filled 2012-03-18 (×2): qty 1

## 2012-03-18 MED ORDER — POTASSIUM CHLORIDE 10 MEQ/100ML IV SOLN
10.0000 meq | INTRAVENOUS | Status: AC
  Administered 2012-03-18 – 2012-03-19 (×6): 10 meq via INTRAVENOUS
  Filled 2012-03-18 (×5): qty 100

## 2012-03-18 MED ORDER — FELODIPINE ER 5 MG PO TB24
5.0000 mg | ORAL_TABLET | Freq: Every day | ORAL | Status: DC
  Administered 2012-03-19: 5 mg via ORAL
  Filled 2012-03-18 (×2): qty 1

## 2012-03-18 MED ORDER — DULOXETINE HCL 60 MG PO CPEP
60.0000 mg | ORAL_CAPSULE | Freq: Every day | ORAL | Status: DC
  Administered 2012-03-19 – 2012-03-23 (×5): 60 mg via ORAL
  Filled 2012-03-18 (×5): qty 1

## 2012-03-18 MED ORDER — ASPIRIN 81 MG PO TABS: 81.0000 mg | ORAL_TABLET | Freq: Every day | ORAL | Status: DC

## 2012-03-18 NOTE — H&P (Signed)
[facility] History and Physical  Carrie Best VZD:[id number] DOB: 13-Jul-1928 DOA: 03/18/2012  Referring physician: Trixie Dredge, ER physician  PCP: Lillia Mountain, MD   Cardiologist: Kristeen Miss, [facility]  Chief Complaint: Diarrhea plus cough plus weakness  HPI:  Patient is an 78 year old white female with past medical history of diastolic heart failure (although she does not know that she has this), hypertension and obesity who was at [facility] 1 month ago and at that time was put on an antibiotic which she completed a seven-day course of for what the daughter thinks was a UTI. Since that time, she has been going to outpatient rehabilitation to improve her deconditioning. In the last 3-4 days she's been having continued episodes of sudden watery diarrhea. She has not noted any fevers. She was also diagnosed with bilateral pneumonia 2 days ago and started on Levaquin yesterday. Her cough has continued to persist as well as her diarrhea (which did start several days before antibiotics were started) so she came into the emergency room for further evaluation.  In the emergency room, the patient was noted by chest x-ray to have small bilateral pleural effusions but more concerning for multilobar pneumonia. Other labs of note were a potassium level of 2.4, normal renal function, a BNP of 2341 and a white count of 15.7. Patient was given a dose of Lasix and started on antibiotics for healthcare associated pneumonia. Hospitalists were called for further evaluation and admission.  Review of Systems:  I saw the patient in the emergency room, she was doing okay. She complained of overall malaise and feeling of diarrhea coming back. She denied any headaches, vision changes, dysphasia, chest pain, palpitations. She did complain of some shortness of breath although it was improved since the last few days. She denied any wheezing. She did complain of a nonproductive cough. She  denied any abdominal pain, hematuria, dysuria, constipation, focal extremity numbness weakness or pain. She does complain of continuing diarrhea as well as overall weakness. She has not much of an appetite. Review of systems otherwise negative.  Past Medical History  Diagnosis Date  . Hypertension   . Arthritis   . Dyspnea   . Diastolic dysfunction   . Hyperlipidemia   . Congestive heart failure     Congestive heart failure symptoms   Past Surgical History  Procedure Date  . Appendectomy   . Other surgical history     Bladder Surgery   Social History:  reports that she has never smoked. She does not have any smokeless tobacco history on file. She reports that she does not drink alcohol or use illicit drugs. patient lives with her daughter. The last few weeks, she's been going to outpatient rehabilitation to improve her deconditioning.  Allergies  Allergen Reactions  . Codeine Nausea And Vomiting  . Penicillins Hives  . Sulfur Nausea And Vomiting    Family History  Problem Relation Age of Onset  . Stroke Mother   . Stroke Brother     Prior to Admission medications   Medication Sig Start Date End Date Taking? Authorizing Provider  aspirin 81 MG tablet Take 81 mg by mouth daily.   Yes Historical Provider, MD  docusate sodium (COLACE) 100 MG capsule Take 100 mg by mouth 2 (two) times daily.   Yes Historical Provider, MD  DULoxetine (CYMBALTA) 60 MG capsule Take 60 mg by mouth daily.   Yes Historical Provider, MD  felodipine (PLENDIL) 5 MG 24 hr tablet Take 5 mg  by mouth daily.   Yes Historical Provider, MD  fish oil-omega-3 fatty acids 1000 MG capsule Take 1 g by mouth daily.   Yes Historical Provider, MD  furosemide (LASIX) 20 MG tablet Take 20 mg by mouth 2 (two) times daily.   Yes Historical Provider, MD  levofloxacin (LEVAQUIN) 750 MG tablet Take 750 mg by mouth daily. For 14 days   Yes Historical Provider, MD  lisinopril (PRINIVIL,ZESTRIL) 5 MG tablet Take 5 mg by mouth  daily.   Yes Historical Provider, MD  metoprolol tartrate (LOPRESSOR) 25 MG tablet Take 75 mg by mouth 2 (two) times daily.   Yes Historical Provider, MD  oxyCODONE-acetaminophen (PERCOCET) 5-325 MG per tablet Take 1 tablet by mouth every 4 (four) hours as needed. pain   Yes Historical Provider, MD  pantoprazole (PROTONIX) 40 MG tablet Take 40 mg by mouth daily.   Yes Historical Provider, MD  ranitidine (ZANTAC) 150 MG capsule Take 150 mg by mouth daily as needed. Reflux   Yes Historical Provider, MD  senna (SENOKOT) 8.6 MG TABS Take 1 tablet by mouth daily.   Yes Historical Provider, MD  simvastatin (ZOCOR) 40 MG tablet Take 40 mg by mouth every evening.   Yes Historical Provider, MD  zolpidem (AMBIEN) 10 MG tablet Take 10 mg by mouth at bedtime as needed.   Yes Historical Provider, MD   Physical Exam: Filed Vitals:   03/18/12 1855 03/18/12 1929 03/18/12 1930 03/18/12 1945  BP:  123/63 129/68 115/55  Pulse:  105 107 98  Temp:  97.3 F (36.3 C)    TempSrc:  Oral    Resp:  22 19 21   Height: 5\' 8"  (1.727 m)     Weight: 90.719 kg (200 lb)     SpO2:  98% 97% 98%     General:  Alert and oriented x3, no acute distress, fatigued, looks younger than stated age  Eyes: Sclera nonicteric, extraocular movements intact  ENT: Normocephalic, atraumatic, mucous membranes are slightly dry  Neck: Supple no JVD appreciated. No palpable thyromegaly  Cardiovascular: Regular rate and rhythm, S1-S2, soft, 2/6 systolic ejection murmur, occasional ectopic beat  Respiratory: Decreased breath sounds throughout more so at the bases  Abdomen: Soft, obese, nontender, hypoactive bowel sounds  Skin: No rashes, skin tears or lesions  Musculoskeletal: Extremities-1+ pitting edema from the knees down.  Psychiatric: Alert and oriented, no evidence of psychoses  Neurologic: No focal neurological deficits  Labs on Admission:  Basic Metabolic Panel:  Lab 03/18/12 4098  NA 134*  K 2.4*  CL 93*  CO2 29    GLUCOSE 113*  BUN 9  CREATININE 0.85  CALCIUM 9.1  MG --  PHOS --   Liver Function Tests:  Lab 03/18/12 1625  AST 12  ALT 8  ALKPHOS 72  BILITOT 0.9  PROT 7.0  ALBUMIN 3.3*   CBC:  Lab 03/18/12 1625  WBC 15.7*  NEUTROABS --  HGB 11.6*  HCT 33.1*  MCV 84.0  PLT 232   BNP at 2134  Radiological Exams on Admission:  Dg Chest 2 View 03/18/2012    IMPRESSION:  1.  New ill-defined areas of airspace opacity in the right upper lobe and left perihilar region, suspicious for multilobar pneumonia. 2.  Small bilateral pleural effusions and mild bibasilar atelectasis.     EKG: Independently reviewed. Atrial fibrillation noted with lateral changes, possible repolarization and LVH  Assessment/Plan Principal Problem:  *Healthcare-associated pneumonia: Started on brought spectrum antibiotics to account for healthcare associated pneumonia.  Continue oxygen plus nebulizers when necessary  Active Problems:  Acute on chronic diastolic congestive heart failure: Patient has a previous history of diastolic heart failure although she and her family have no knowledge of this. Has started diuretics and she's already on an ACE inhibitor and beta blocker. Checking 2-D echo and have provided education. We'll defer to primary care physician as to whether they need to consult her cardiologist. Maryclare Labrador cycle her enzymes  Atrial fibrillation: No evidence of this on previous record. During my exam she was in normal sinus rhythm suspect she may be going in and out of A. fib. Place on telemetry. Check 2-D echo. Heparin per pharmacy. We'll defer to a discussion with her PCP is whether not to plan for long-term Coumadin or Pradaxa. Already on beta blocker. Rate control.   Obesity   Diarrhea: Given patient's timeframe with previous antibiotics, certainly concerning for C. difficile. Have ordered C. difficile PCR. Discontinue her PPI and Colace. Await results. Placed on enteric precautions.   HTN  (hypertension): Continue beta blocker and ACE inhibitor   GERD (gastroesophageal reflux disease): Continue Zantac and discontinued her PPI   Hyperlipidemia: Continue statin.   Sleep disturbance: Continue when necessary Ambien   Code Status: I discussed this with the patient and she wishes to BE a full code. Her daughter alluded to the fact that on her living will she appeared to be a DO NOT RESUSCITATE and she may have changed her mind since then.  Family Communication: Tora Perches Daughter [phone]  Disposition Plan: Stable for now. Multiple complex medical problems. Will likely be here for several days. Dr. Valentina Lucks and his partners will take over the patient tomorrow morning  Hollice Espy [facility] Pager [phone]  If 7PM-7AM, please contact night-coverage [email or web address] Password [email or web address] 03/18/2012, 9:10 PM

## 2012-03-18 NOTE — ED Notes (Signed)
Pt family here, states pt had fall 5 weeks ago, was at rehab [facility]), released from nursing home today d/t increase of weakness. Had chest x-ray at [facility], showed fluid around lungs, bilat  Treated with Lasix, Levaquin - states levaquin giving diarrhea x3 days.

## 2012-03-18 NOTE — ED Notes (Signed)
Pt attempted to use bedside commode however was unsuccessful providing [place] with a sample.

## 2012-03-18 NOTE — ED Provider Notes (Signed)
History     CSN: [id number]  Arrival date & time 03/18/12  1526   First MD Initiated Contact with Patient 03/18/12 1620      Chief Complaint  Patient presents with  . Pneumonia    (Consider location/radiation/quality/duration/timing/severity/associated sxs/prior treatment) HPI  Weakness, cough. The patient states that she was diagnosed with bilateral pneumonia 2 days ago and started on Levaquin. She's been taking the Levaquin as prescribed. She states she started having watery diarrhea today and has gone to back approximately 5 times. She feels dehydrated and urinating less than usual. She continues to have severe cough but states "I can't cough up anything". Mild shortness of breath. She states that she was using supplemental oxygen at [facility] (rehab for recent falls) but went home yesterday without oxygen she didn't feel well without the oxygen. Denies chest pain. No Fevers. Endorses chills. Denies hematuria/dysuria/freq/urgency. No rash.  ED Notes, ED Provider Notes from 03/18/12 0000 to 03/18/12 16:11:21       Pincus Sanes, RN 03/18/2012 15:35      Pt family here, states pt had fall 5 weeks ago, was at rehab [facility]), released from nursing home today d/t increase of weakness. Had chest x-ray at [facility], showed fluid around lungs, bilat Treated with Lasix, Levaquin - states levaquin giving diarrhea x3 days.         Valentina Gu, [place] 03/18/2012 15:26      WUJ:[id number]  Expected date:03/18/12  Expected time: 3:03 PM  Means of arrival:Ambulance  Comments:  Poss pneumonia         Pincus Sanes, RN 03/18/2012 15:23      Per EMS, was seen at [facility] in [place] for possible double pneumonia, released home. C/o increase in weakness and cough x1 day, advised to seek emergency treatment.      Past Medical History  Diagnosis Date  . Hypertension   . Arthritis   . Dyspnea   . Diastolic dysfunction   . Hyperlipidemia   . Congestive heart failure      Congestive heart failure symptoms    Past Surgical History  Procedure Date  . Appendectomy   . Other surgical history     Bladder Surgery    Family History  Problem Relation Age of Onset  . Stroke Mother   . Stroke Brother     History  Substance Use Topics  . Smoking status: Never Smoker   . Smokeless tobacco: Not on file  . Alcohol Use: No    OB History    Grav Para Term Preterm Abortions TAB SAB Ect Mult Living                  Review of Systems  All other systems reviewed and are negative.   except as noted HPI   Allergies  Codeine; Penicillins; and Sulfur  Home Medications   Current Outpatient Rx  Name Route Sig Dispense Refill  . ASPIRIN 81 MG PO TABS Oral Take 81 mg by mouth daily.    Marland Kitchen DOCUSATE SODIUM 100 MG PO CAPS Oral Take 100 mg by mouth 2 (two) times daily.    . DULOXETINE HCL 60 MG PO CPEP Oral Take 60 mg by mouth daily.    Marland Kitchen FELODIPINE ER 5 MG PO TB24 Oral Take 5 mg by mouth daily.    . OMEGA-3 FATTY ACIDS 1000 MG PO CAPS Oral Take 1 g by mouth daily.    . FUROSEMIDE 20 MG PO  TABS Oral Take 20 mg by mouth 2 (two) times daily.    Marland Kitchen LEVOFLOXACIN 750 MG PO TABS Oral Take 750 mg by mouth daily. For 14 days    . LISINOPRIL 5 MG PO TABS Oral Take 5 mg by mouth daily.    Marland Kitchen METOPROLOL TARTRATE 25 MG PO TABS Oral Take 75 mg by mouth 2 (two) times daily.    . OXYCODONE-ACETAMINOPHEN 5-325 MG PO TABS Oral Take 1 tablet by mouth every 4 (four) hours as needed. pain    . PANTOPRAZOLE SODIUM 40 MG PO TBEC Oral Take 40 mg by mouth daily.    Marland Kitchen RANITIDINE HCL 150 MG PO CAPS Oral Take 150 mg by mouth daily as needed. Reflux    . SENNA 8.6 MG PO TABS Oral Take 1 tablet by mouth daily.    Marland Kitchen SIMVASTATIN 40 MG PO TABS Oral Take 40 mg by mouth every evening.    Marland Kitchen ZOLPIDEM TARTRATE 10 MG PO TABS Oral Take 10 mg by mouth at bedtime as needed.      BP 156/65  Pulse 106  Temp 100.2 F (37.9 C) (Rectal)  Resp 22  SpO2 97%  Physical Exam  Nursing note and  vitals reviewed. Constitutional: She is oriented to person, place, and time. She appears well-developed.  HENT:  Head: Atraumatic.  Mouth/Throat: Oropharynx is clear and moist.  Eyes: Conjunctivae and EOM are normal. Pupils are equal, round, and reactive to light.  Neck: Normal range of motion. Neck supple.  Cardiovascular: Normal rate, regular rhythm, normal heart sounds and intact distal pulses.   Pulmonary/Chest: Effort normal and breath sounds normal. No respiratory distress. She has no wheezes. She has no rales.       B/l crackles  Abdominal: Soft. She exhibits no distension. There is no tenderness. There is no rebound and no guarding.  Musculoskeletal: Normal range of motion. She exhibits no edema and no tenderness.  Neurological: She is alert and oriented to person, place, and time.  Skin: Skin is warm and dry. No rash noted. There is pallor.  Psychiatric: She has a normal mood and affect.    Date: 03/18/2012  Rate: 96  Rhythm: atrial fibrillation  QRS Axis: left  Intervals: afib  ST/T Wave abnormalities: ST depressions laterally  Conduction Disutrbances:none  Narrative Interpretation:   Old EKG Reviewed: changes noted   ED Course  Procedures (including critical care time)  Labs Reviewed  CBC - Abnormal; Notable for the following:    WBC 15.7 (*)     Hemoglobin 11.6 (*)     HCT 33.1 (*)     All other components within normal limits  COMPREHENSIVE METABOLIC PANEL - Abnormal; Notable for the following:    Sodium 134 (*)     Potassium 2.4 (*)     Chloride 93 (*)     Glucose, Bld 113 (*)     Albumin 3.3 (*)     GFR calc non Af Amer 62 (*)     GFR calc Af Amer 72 (*)     All other components within normal limits  PRO B NATRIURETIC PEPTIDE - Abnormal; Notable for the following:    Pro B Natriuretic peptide (BNP) 2341.0 (*)     All other components within normal limits  LACTIC ACID, PLASMA  POCT I-STAT TROPONIN I  CULTURE, BLOOD (ROUTINE X 2)  CULTURE, BLOOD  (ROUTINE X 2)  URINALYSIS, ROUTINE W REFLEX MICROSCOPIC  LEGIONELLA ANTIGEN, URINE  CLOSTRIDIUM DIFFICILE BY PCR  Dg Chest 2 View  03/18/2012  *RADIOLOGY REPORT*  Clinical Data: Cough and fever.  CHEST - 2 VIEW  Comparison: 05/16/2008  Findings: New areas of airspace opacity are seen in the right upper lobe and left perihilar region.  Small pleural effusions are seen bilaterally with associated mild bibasilar atelectasis.  Mild cardiomegaly remains stable.  IMPRESSION:  1.  New ill-defined areas of airspace opacity in the right upper lobe and left perihilar region, suspicious for multilobar pneumonia. 2.  Small bilateral pleural effusions and mild bibasilar atelectasis.  Original Report Authenticated By: Danae Orleans, M.D.    1. Healthcare-associated pneumonia   2. Weakness   3. Hypoxia   4. Diarrhea     MDM  Possible pneumonia. She does meet SIRS criteria today. Troponin is negative. She is on Levaquin as an outpatient. Recent rehabilitation stay. Will cover for healthcare associated pneumonia. Anticipate admission.  CXR c/w pneumonia. Vanc and cefepime ordered. Potassium repleted. Urine legionella. Cdiff if able to produce sample in ED. Discussed admission with [facility] Dr. Rito Ehrlich who req lasix given.        Forbes Cellar, MD 03/18/12 [phone]

## 2012-03-18 NOTE — ED Notes (Signed)
Per EMS, was seen at [facility] in [place] for possible double pneumonia, released home. C/o increase in weakness and cough x1 day, advised to seek emergency treatment.

## 2012-03-18 NOTE — ED Notes (Signed)
OZH:[id number]<HQ> Expected date:03/18/12<BR> Expected time: 3:03 PM<BR> Means of arrival:Ambulance<BR> Comments:<BR> Poss pneumonia

## 2012-03-18 NOTE — ED Notes (Signed)
Pt aware of the need for a urine sample however, is unable to void at this time. 

## 2012-03-18 NOTE — Progress Notes (Signed)
ANTIBIOTIC CONSULT NOTE - INITIAL  Pharmacy Consult for Vancomcyin Indication: rule out pneumonia  Allergies  Allergen Reactions  . Codeine Nausea And Vomiting  . Penicillins Hives  . Sulfur Nausea And Vomiting    Patient Measurements:   Pt stated weight 140 lbs  Vital Signs: Temp: 100.2 F (37.9 C) (07/13 1649) Temp src: Rectal (07/13 1649) BP: 156/65 mmHg (07/13 1526) Pulse Rate: 106  (07/13 1526) Intake/Output from previous day:    Labs:  Basename 03/18/12 1625  WBC 15.7*  HGB 11.6*  PLT 232  LABCREA --  CREATININE 0.85   CrCl is unknown because there is no height on file for the current visit. No results found for this basename: VANCOTROUGH:2,VANCOPEAK:2,VANCORANDOM:2,GENTTROUGH:2,GENTPEAK:2,GENTRANDOM:2,TOBRATROUGH:2,TOBRAPEAK:2,TOBRARND:2,AMIKACINPEAK:2,AMIKACINTROU:2,AMIKACIN:2, in the last 72 hours   Microbiology: No results found for this or any previous visit (from the past 720 hour(s)).  Medical History: Past Medical History  Diagnosis Date  . Hypertension   . Arthritis   . Dyspnea   . Diastolic dysfunction   . Hyperlipidemia   . Congestive heart failure     Congestive heart failure symptoms    Medications:  Anti-infectives     Start     Dose/Rate Route Frequency Ordered Stop   03/18/12 1845   ceFEPIme (MAXIPIME) 2 g in dextrose 5 % 50 mL IVPB        2 g 100 mL/hr over 30 Minutes Intravenous Every 12 hours 03/18/12 1808           Assessment:  84 YOF admit from nursing home with suspected pneumonia  PTA medications included Levaquin 750mg  scheduled for 14 days (last dose 7/13)  Cefepime dosing per MD  SCr appears normal at 0.85, CrCl (n) ~ 58 ml/min  Goal of Therapy:  Vancomycin trough level 15-20 mcg/ml  Plan:   Vancomycin 500mg  IV q12h.  Measure Vanc trough at steady state.  Follow up renal fxn and culture results.    Lynann Beaver PharmD, BCPS Pager [phone] 03/18/2012 6:37 PM

## 2012-03-19 ENCOUNTER — Inpatient Hospital Stay (HOSPITAL_COMMUNITY): Payer: Medicare Other

## 2012-03-19 LAB — URINALYSIS, ROUTINE W REFLEX MICROSCOPIC
Bilirubin Urine: NEGATIVE
Glucose, UA: NEGATIVE mg/dL
Hgb urine dipstick: NEGATIVE
Ketones, ur: NEGATIVE mg/dL
Nitrite: NEGATIVE
Specific Gravity, Urine: 1.012 (ref 1.005–1.030)
pH: 7 (ref 5.0–8.0)

## 2012-03-19 LAB — HEPARIN LEVEL (UNFRACTIONATED): Heparin Unfractionated: 0.9 IU/mL — ABNORMAL HIGH (ref 0.30–0.70)

## 2012-03-19 LAB — CARDIAC PANEL(CRET KIN+CKTOT+MB+TROPI)
Relative Index: INVALID (ref 0.0–2.5)
Relative Index: INVALID (ref 0.0–2.5)
Total CK: 42 U/L (ref 7–177)
Total CK: 35 U/L (ref 7–177)
Total CK: 42 U/L (ref 7–177)
Troponin I: <0.3 ng/mL (ref <0.30)

## 2012-03-19 LAB — BASIC METABOLIC PANEL
GFR calc Af Amer: 65 mL/min — ABNORMAL LOW (ref >90)
GFR calc non Af Amer: 56 mL/min — ABNORMAL LOW (ref >90)
Potassium: 2.6 mEq/L — CL (ref 3.5–5.1)
Sodium: 135 mEq/L (ref 135–145)

## 2012-03-19 LAB — LEGIONELLA ANTIGEN, URINE: Legionella Antigen, Urine: NEGATIVE

## 2012-03-19 LAB — EXPECTORATED SPUTUM ASSESSMENT W GRAM STAIN, RFLX TO RESP C

## 2012-03-19 LAB — CBC
MCHC: 33.7 g/dL (ref 30.0–36.0)
RDW: 14.9 % (ref 11.5–15.5)

## 2012-03-19 LAB — CLOSTRIDIUM DIFFICILE BY PCR: Toxigenic C. Difficile by PCR: NEGATIVE

## 2012-03-19 MED ORDER — HEPARIN (PORCINE) IN NACL 100-0.45 UNIT/ML-% IJ SOLN
14.0000 [IU]/kg/h | INTRAMUSCULAR | Status: DC
  Administered 2012-03-19: 14 [IU]/kg/h via INTRAVENOUS
  Filled 2012-03-19: qty 250

## 2012-03-19 MED ORDER — FUROSEMIDE 40 MG PO TABS
40.0000 mg | ORAL_TABLET | Freq: Every day | ORAL | Status: DC
  Administered 2012-03-20 – 2012-03-23 (×4): 40 mg via ORAL
  Filled 2012-03-19 (×5): qty 1

## 2012-03-19 MED ORDER — ENOXAPARIN SODIUM 40 MG/0.4ML ~~LOC~~ SOLN: 40.0000 mg | SUBCUTANEOUS | Status: DC

## 2012-03-19 MED ORDER — ACETAMINOPHEN 325 MG PO TABS: 650.0000 mg | ORAL_TABLET | ORAL | Status: DC | PRN

## 2012-03-19 MED ORDER — HEPARIN (PORCINE) IN NACL 100-0.45 UNIT/ML-% IJ SOLN
1050.0000 [IU]/h | INTRAMUSCULAR | Status: DC
  Filled 2012-03-19: qty 250

## 2012-03-19 MED ORDER — HEPARIN BOLUS VIA INFUSION
2000.0000 [IU] | Freq: Once | INTRAVENOUS | Status: AC
  Administered 2012-03-19: 2000 [IU] via INTRAVENOUS
  Filled 2012-03-19: qty 2000

## 2012-03-19 MED ORDER — DEXTROSE 5 % IV SOLN
1.0000 g | Freq: Three times a day (TID) | INTRAVENOUS | Status: DC
  Administered 2012-03-19: 1 g via INTRAVENOUS

## 2012-03-19 MED ORDER — SODIUM CHLORIDE 0.9 % IV SOLN
250.0000 mL | INTRAVENOUS | Status: DC | PRN
Start: 2012-03-19 — End: 2012-03-23

## 2012-03-19 MED ORDER — SODIUM CHLORIDE 0.9 % IJ SOLN: 3.0000 mL | INTRAMUSCULAR | Status: DC | PRN

## 2012-03-19 MED ORDER — ONDANSETRON HCL 4 MG/2ML IJ SOLN
4.0000 mg | Freq: Four times a day (QID) | INTRAMUSCULAR | Status: DC | PRN
  Administered 2012-03-19: 4 mg via INTRAVENOUS
  Filled 2012-03-19: qty 2

## 2012-03-19 MED ORDER — FUROSEMIDE 10 MG/ML IJ SOLN
40.0000 mg | Freq: Two times a day (BID) | INTRAMUSCULAR | Status: DC
  Administered 2012-03-19: 40 mg via INTRAVENOUS
  Filled 2012-03-19 (×3): qty 4

## 2012-03-19 MED ORDER — LEVOFLOXACIN IN D5W 750 MG/150ML IV SOLN
750.0000 mg | Freq: Every day | INTRAVENOUS | Status: AC
  Administered 2012-03-19 – 2012-03-20 (×3): 750 mg via INTRAVENOUS
  Filled 2012-03-19 (×3): qty 150

## 2012-03-19 MED ORDER — LEVALBUTEROL HCL 0.63 MG/3ML IN NEBU
0.6300 mg | INHALATION_SOLUTION | Freq: Three times a day (TID) | RESPIRATORY_TRACT | Status: DC | PRN
  Filled 2012-03-19: qty 3

## 2012-03-19 MED ORDER — LEVALBUTEROL HCL 0.63 MG/3ML IN NEBU
0.6300 mg | INHALATION_SOLUTION | Freq: Four times a day (QID) | RESPIRATORY_TRACT | Status: DC
  Filled 2012-03-19 (×5): qty 3

## 2012-03-19 MED ORDER — POTASSIUM CHLORIDE CRYS ER 20 MEQ PO TBCR
40.0000 meq | EXTENDED_RELEASE_TABLET | Freq: Two times a day (BID) | ORAL | Status: DC
  Administered 2012-03-19 – 2012-03-20 (×5): 40 meq via ORAL
  Filled 2012-03-19 (×8): qty 2

## 2012-03-19 MED ORDER — LEVALBUTEROL HCL 0.63 MG/3ML IN NEBU
0.6300 mg | INHALATION_SOLUTION | RESPIRATORY_TRACT | Status: DC | PRN
  Administered 2012-03-19 (×2): 0.63 mg via RESPIRATORY_TRACT
  Filled 2012-03-19: qty 3

## 2012-03-19 MED ORDER — VANCOMYCIN HCL 1000 MG IV SOLR
750.0000 mg | Freq: Two times a day (BID) | INTRAVENOUS | Status: DC
  Administered 2012-03-19 – 2012-03-20 (×5): 750 mg via INTRAVENOUS
  Filled 2012-03-19 (×7): qty 750

## 2012-03-19 MED ORDER — SODIUM CHLORIDE 0.9 % IJ SOLN
3.0000 mL | Freq: Two times a day (BID) | INTRAMUSCULAR | Status: DC
  Administered 2012-03-20 – 2012-03-22 (×4): 3 mL via INTRAVENOUS

## 2012-03-19 MED ORDER — POTASSIUM CHLORIDE 10 MEQ/100ML IV SOLN
10.0000 meq | INTRAVENOUS | Status: AC
  Administered 2012-03-19 (×4): 10 meq via INTRAVENOUS
  Filled 2012-03-19 (×4): qty 100

## 2012-03-19 MED ORDER — LIVING BETTER WITH HEART FAILURE BOOK
Freq: Once | Status: AC
  Administered 2012-03-19: 1
  Filled 2012-03-19: qty 1

## 2012-03-19 MED ORDER — ENOXAPARIN SODIUM 100 MG/ML ~~LOC~~ SOLN
1.0000 mg/kg | Freq: Two times a day (BID) | SUBCUTANEOUS | Status: DC
  Administered 2012-03-19 – 2012-03-23 (×8): 85 mg via SUBCUTANEOUS
  Filled 2012-03-19 (×11): qty 1

## 2012-03-19 NOTE — Progress Notes (Signed)
Subjective: Patient is pleasant patient is pleasant, no apparent distress. According to nurse occasional rhonchi breath sounds and feels patient needs more frequent nebulizer treatment. There have been no complaints of diarrhea since hospitalization Admission H&P reviewed, x-ray consistent with pneumonia. She is in atrial fibrillation which could be causing the elevated BNP. With the prior complaint of diarrhea will hold overdiuresis. Continue antibiotics, followup echo continue anticoagulation for atrial fibrillation.  Objective: Vital signs in last 24 hours: Temp:  [97.3 F (36.3 C)-100.2 F (37.9 C)] 99.2 F (37.3 C) (07/14 0600) Pulse Rate:  [98-113] 98  (07/14 0600) Resp:  [19-22] 20  (07/14 0600) BP: (115-156)/(55-81) 138/81 mmHg (07/14 0600) SpO2:  [90 %-98 %] 96 % (07/14 0600) Weight:  [86.8 kg (191 lb 5.8 oz)-90.719 kg (200 lb)] 87.2 kg (192 lb 3.9 oz) (07/14 0600) Weight change:  Last BM Date: 03/19/12  Intake/Output from previous day: 07/13 0701 - 07/14 0700 In: 180 [I.V.:180] Out: 350 [Urine:350] Intake/Output this shift:    Resp: Scattered rhonchi, no expiratory wheeze Cardio: irregularly irregular rhythm Extremities: extremities normal, atraumatic, no cyanosis or edema  Lab Results:  Results for orders placed during the hospital encounter of 03/18/12 (from the past 24 hour(s))  CBC     Status: Abnormal   Collection Time   03/18/12  4:25 PM      Component Value Range   WBC 15.7 (*) 4.0 - 10.5 K/uL   RBC 3.94  3.87 - 5.11 MIL/uL   Hemoglobin 11.6 (*) 12.0 - 15.0 g/dL   HCT 16.1 (*) 09.6 - 04.5 %   MCV 84.0  78.0 - 100.0 fL   MCH 29.4  26.0 - 34.0 pg   MCHC 35.0  30.0 - 36.0 g/dL   RDW 40.9  81.1 - 91.4 %   Platelets 232  150 - 400 K/uL  COMPREHENSIVE METABOLIC PANEL     Status: Abnormal   Collection Time   03/18/12  4:25 PM      Component Value Range   Sodium 134 (*) 135 - 145 mEq/L   Potassium 2.4 (*) 3.5 - 5.1 mEq/L   Chloride 93 (*) 96 - 112 mEq/L   CO2  29  19 - 32 mEq/L   Glucose, Bld 113 (*) 70 - 99 mg/dL   BUN 9  6 - 23 mg/dL   Creatinine, Ser 7.82  0.50 - 1.10 mg/dL   Calcium 9.1  8.4 - 95.6 mg/dL   Total Protein 7.0  6.0 - 8.3 g/dL   Albumin 3.3 (*) 3.5 - 5.2 g/dL   AST 12  0 - 37 U/L   ALT 8  0 - 35 U/L   Alkaline Phosphatase 72  39 - 117 U/L   Total Bilirubin 0.9  0.3 - 1.2 mg/dL   GFR calc non Af Amer 62 (*) >90 mL/min   GFR calc Af Amer 72 (*) >90 mL/min  LACTIC ACID, PLASMA     Status: Normal   Collection Time   03/18/12  4:25 PM      Component Value Range   Lactic Acid, Venous 1.0  0.5 - 2.2 mmol/L  PRO B NATRIURETIC PEPTIDE     Status: Abnormal   Collection Time   03/18/12  4:25 PM      Component Value Range   Pro B Natriuretic peptide (BNP) 2341.0 (*) 0 - 450 pg/mL  POCT I-STAT TROPONIN I     Status: Normal   Collection Time   03/18/12  4:58 PM  Component Value Range   Troponin i, poc 0.01  0.00 - 0.08 ng/mL   Comment 3           CARDIAC PANEL(CRET KIN+CKTOT+MB+TROPI)     Status: Normal   Collection Time   03/19/12 12:40 AM      Component Value Range   Total CK 42  7 - 177 U/L   CK, MB 1.7  0.3 - 4.0 ng/mL   Troponin I <0.30  <0.30 ng/mL   Relative Index RELATIVE INDEX IS INVALID  0.0 - 2.5  CBC     Status: Abnormal   Collection Time   03/19/12 12:40 AM      Component Value Range   WBC 14.8 (*) 4.0 - 10.5 K/uL   RBC 4.12  3.87 - 5.11 MIL/uL   Hemoglobin 12.1  12.0 - 15.0 g/dL   HCT 16.1 (*) 09.6 - 04.5 %   MCV 87.1  78.0 - 100.0 fL   MCH 29.4  26.0 - 34.0 pg   MCHC 33.7  30.0 - 36.0 g/dL   RDW 40.9  81.1 - 91.4 %   Platelets 243  150 - 400 K/uL  URINALYSIS, ROUTINE W REFLEX MICROSCOPIC     Status: Normal   Collection Time   03/19/12  1:07 AM      Component Value Range   Color, Urine YELLOW  YELLOW   APPearance CLEAR  CLEAR   Specific Gravity, Urine 1.012  1.005 - 1.030   pH 7.0  5.0 - 8.0   Glucose, UA NEGATIVE  NEGATIVE mg/dL   Hgb urine dipstick NEGATIVE  NEGATIVE   Bilirubin Urine NEGATIVE   NEGATIVE   Ketones, ur NEGATIVE  NEGATIVE mg/dL   Protein, ur NEGATIVE  NEGATIVE mg/dL   Urobilinogen, UA 0.2  0.0 - 1.0 mg/dL   Nitrite NEGATIVE  NEGATIVE   Leukocytes, UA NEGATIVE  NEGATIVE  CARDIAC PANEL(CRET KIN+CKTOT+MB+TROPI)     Status: Normal   Collection Time   03/19/12  9:05 AM      Component Value Range   Total CK 35  7 - 177 U/L   CK, MB 1.7  0.3 - 4.0 ng/mL   Troponin I <0.30  <0.30 ng/mL   Relative Index RELATIVE INDEX IS INVALID  0.0 - 2.5  HEPARIN LEVEL (UNFRACTIONATED)     Status: Abnormal   Collection Time   03/19/12  9:05 AM      Component Value Range   Heparin Unfractionated 0.90 (*) 0.30 - 0.70 IU/mL      Studies/Results: Dg Chest 2 View  03/18/2012  *RADIOLOGY REPORT*  Clinical Data: Cough and fever.  CHEST - 2 VIEW  Comparison: 05/16/2008  Findings: New areas of airspace opacity are seen in the right upper lobe and left perihilar region.  Small pleural effusions are seen bilaterally with associated mild bibasilar atelectasis.  Mild cardiomegaly remains stable.  IMPRESSION:  1.  New ill-defined areas of airspace opacity in the right upper lobe and left perihilar region, suspicious for multilobar pneumonia. 2.  Small bilateral pleural effusions and mild bibasilar atelectasis.  Original Report Authenticated By: Danae Orleans, M.D.    Medications:  Prior to Admission:  Prescriptions prior to admission  Medication Sig Dispense Refill  . aspirin 81 MG tablet Take 81 mg by mouth daily.      Marland Kitchen docusate sodium (COLACE) 100 MG capsule Take 100 mg by mouth 2 (two) times daily.      . DULoxetine (CYMBALTA) 60 MG capsule Take  60 mg by mouth daily.      . felodipine (PLENDIL) 5 MG 24 hr tablet Take 5 mg by mouth daily.      . fish oil-omega-3 fatty acids 1000 MG capsule Take 1 g by mouth daily.      . furosemide (LASIX) 20 MG tablet Take 20 mg by mouth 2 (two) times daily.      Marland Kitchen levofloxacin (LEVAQUIN) 750 MG tablet Take 750 mg by mouth daily. For 14 days      .  lisinopril (PRINIVIL,ZESTRIL) 5 MG tablet Take 5 mg by mouth daily.      . metoprolol tartrate (LOPRESSOR) 25 MG tablet Take 75 mg by mouth 2 (two) times daily.      Marland Kitchen oxyCODONE-acetaminophen (PERCOCET) 5-325 MG per tablet Take 1 tablet by mouth every 4 (four) hours as needed. pain      . pantoprazole (PROTONIX) 40 MG tablet Take 40 mg by mouth daily.      . ranitidine (ZANTAC) 150 MG capsule Take 150 mg by mouth daily as needed. Reflux      . senna (SENOKOT) 8.6 MG TABS Take 1 tablet by mouth daily.      . simvastatin (ZOCOR) 40 MG tablet Take 40 mg by mouth every evening.      . zolpidem (AMBIEN) 10 MG tablet Take 10 mg by mouth at bedtime as needed.       Scheduled:   . a stronger pump book   Does not apply Once  . acetaminophen  650 mg Oral Once  . aspirin  81 mg Oral Daily  . ceFEPime (MAXIPIME) IV  2 g Intravenous Q12H  . DULoxetine  60 mg Oral Daily  . famotidine  20 mg Oral BID  . felodipine  5 mg Oral Daily  . furosemide  40 mg Intravenous Once  . furosemide  40 mg Intravenous Q12H  . heparin  2,000 Units Intravenous Once  . levofloxacin (LEVAQUIN) IV  750 mg Intravenous QHS  . lisinopril  5 mg Oral Daily  . metoprolol tartrate  75 mg Oral BID  . potassium chloride  10 mEq Intravenous Q1 Hr x 6  . potassium chloride  40 mEq Oral BID  . simvastatin  40 mg Oral QPM  . sodium chloride  500 mL Intravenous Once  . sodium chloride  3 mL Intravenous Q12H  . vancomycin  750 mg Intravenous BID  . DISCONTD: aspirin  81 mg Oral Daily  . DISCONTD: ceFEPime (MAXIPIME) IV  1 g Intravenous Q8H  . DISCONTD: DULoxetine  60 mg Oral Daily  . DISCONTD: enoxaparin (LOVENOX) injection  40 mg Subcutaneous Q24H   Continuous:   . heparin 1,050 Units/hr (03/19/12 1058)  . DISCONTD: heparin 14 Units/kg/hr (03/19/12 0127)   ZOX:WRUEAV chloride, acetaminophen, levalbuterol, ondansetron (ZOFRAN) IV, oxyCODONE-acetaminophen, sodium chloride, zolpidem  Assessment/Plan: Pneumonia continue  antibiotics, frequent nebulized treatments, patient still has leukocytosis however without fever or chills no respiratory distress.  Atrial fibrillation questionable duration, continue anticoagulation rate control followup echo check previous records.  Reported diarrhea outpatient, no recurrence while hospitalized, as patient was on antibiotics previously will followup C. Difficile  Hypokalemia we'll replete IV, check followup labs  LOS: 1 day   Sarahelizabeth Conway D 03/19/2012, 11:01 AM

## 2012-03-19 NOTE — Progress Notes (Signed)
ANTIBIOTIC CONSULT NOTE - INITIAL  Pharmacy Consult for vancomycin/heparin Indication: rule out pneumonia/afib  Allergies  Allergen Reactions  . Codeine Nausea And Vomiting  . Penicillins Hives  . Sulfur Nausea And Vomiting    Patient Measurements: Height: 5\' 8"  (172.7 cm) Weight: 191 lb 5.8 oz (86.8 kg) IBW/kg (Calculated) : 63.9  Adjusted Body Weight:   Vital Signs: Temp: 98.7 F (37.1 C) (07/13 2258) Temp src: Oral (07/13 2258) BP: 132/80 mmHg (07/13 2258) Pulse Rate: 113  (07/13 2258) Intake/Output from previous day:   Intake/Output from this shift:    Labs:  Basename 03/18/12 1625  WBC 15.7*  HGB 11.6*  PLT 232  LABCREA --  CREATININE 0.85   Estimated Creatinine Clearance: 58.9 ml/min (by C-G formula based on Cr of 0.85). No results found for this basename: VANCOTROUGH:2,VANCOPEAK:2,VANCORANDOM:2,GENTTROUGH:2,GENTPEAK:2,GENTRANDOM:2,TOBRATROUGH:2,TOBRAPEAK:2,TOBRARND:2,AMIKACINPEAK:2,AMIKACINTROU:2,AMIKACIN:2, in the last 72 hours   Microbiology: No results found for this or any previous visit (from the past 720 hour(s)).  Medical History: Past Medical History  Diagnosis Date  . Hypertension   . Arthritis   . Dyspnea   . Diastolic dysfunction   . Hyperlipidemia   . Congestive heart failure     Congestive heart failure symptoms    Medications:  Infusions:    . heparin     Anti-infectives     Start     Dose/Rate Route Frequency Ordered Stop   03/19/12 0045   vancomycin (VANCOCIN) 750 mg in sodium chloride 0.9 % 150 mL IVPB        750 mg 150 mL/hr over 60 Minutes Intravenous 2 times daily 03/19/12 0030     03/19/12 0030   levofloxacin (LEVAQUIN) IVPB 750 mg        750 mg 100 mL/hr over 90 Minutes Intravenous Daily at bedtime 03/19/12 0013 03/21/12 2159   03/19/12 0013   ceFEPIme (MAXIPIME) 1 g in dextrose 5 % 50 mL IVPB  Status:  Discontinued        1 g 100 mL/hr over 30 Minutes Intravenous 3 times per day 03/19/12 0013 03/19/12 0024   03/18/12 1845   ceFEPIme (MAXIPIME) 2 g in dextrose 5 % 50 mL IVPB        2 g 100 mL/hr over 30 Minutes Intravenous Every 12 hours 03/18/12 1808           Assessment: Patient with PNA and Afib.  Patient was worked up for vancomycin earlier but dose was not entered.    Goal of Therapy:  Vancomycin trough level 15-20 mcg/ml Heparin level 0.3-0.7   Plan:  Measure antibiotic drug levels at steady state Follow up culture results Vancomycin 750mg  iv q12hr,  Monitor platelets by anticoagulation protocol: Yes Heparin bolus 2000 units iv x1, then 1200 units/hr, daily CBC/heparin level, next heparin level at 0900. Aleene Davidson Crowford 03/19/2012,12:36 AM    Monitor platelets by anticoagulation protocol: Yes

## 2012-03-19 NOTE — Progress Notes (Signed)
ANTICOAGULATION CONSULT NOTE   Pharmacy Consult for Lovenox Indication: atrial fibrillation  Patient Measurements: Height: 5\' 8"  (172.7 cm) Weight: 192 lb 3.9 oz (87.2 kg) IBW/kg (Calculated) : 63.9   Labs:  Basename 03/19/12 0905 03/19/12 0040 03/18/12 1625  HGB -- 12.1 11.6*  HCT -- 35.9* 33.1*  PLT -- 243 232  APTT -- -- --  LABPROT -- -- --  INR -- -- --  HEPARINUNFRC 0.90* -- --  CREATININE -- -- 0.85  CKTOTAL 35 42 --  CKMB 1.7 1.7 --  TROPONINI <0.30 <0.30 --    Estimated Creatinine Clearance: 59 ml/min (by C-G formula based on Cr of 0.85).  Assessment:  84 YOF started on heparin for atrial fibrillation.  No evidence of this on previous record but MD suspecting pt is going in an out of afib.  Started on IV heparin on admission, now switching to Lovenox.  SCr WNL, CrCl>30 ml/min.  CBC stable.  No bleeding/complications reported.  Goal of Therapy:  Anti-Xa level 0.6-1.2 units/ml Monitor platelets by anticoagulation protocol: Yes   Plan:  Start Lovenox 85 mg (~1 mg/kg) q12h.  Start 1 hour following d/c of IV heparin. F/u decision for long-term oral anticoagulation plan.  Clance Boll 03/19/2012,11:44 AM

## 2012-03-19 NOTE — Progress Notes (Signed)
Cm spoke with patient concerning dc planning. Pt unable to continue interview due to coughing & wheezing. Patient states having HH services in the past. Unsure of name of agency. Patient provided with choice list for [facility].    Leonie Green [phone]

## 2012-03-19 NOTE — Progress Notes (Signed)
Lab called with critical value-K 2.6. Notified Dr.Polite with orders received and initiated.

## 2012-03-19 NOTE — Progress Notes (Signed)
ANTICOAGULATION CONSULT NOTE - Follow Up Consult  Pharmacy Consult for IV Heparin Indication: atrial fibrillation  Allergies  Allergen Reactions  . Codeine Nausea And Vomiting  . Penicillins Hives  . Sulfur Nausea And Vomiting    Patient Measurements: Height: 5\' 8"  (172.7 cm) Weight: 192 lb 3.9 oz (87.2 kg) IBW/kg (Calculated) : 63.9  Heparin Dosing Weight: 82 kg  Vital Signs: Temp: 99.2 F (37.3 C) (07/14 0600) Temp src: Oral (07/14 0600) BP: 138/81 mmHg (07/14 0600) Pulse Rate: 98  (07/14 0600)  Labs:  Basename 03/19/12 0905 03/19/12 0040 03/18/12 1625  HGB -- 12.1 11.6*  HCT -- 35.9* 33.1*  PLT -- 243 232  APTT -- -- --  LABPROT -- -- --  INR -- -- --  HEPARINUNFRC 0.90* -- --  CREATININE -- -- 0.85  CKTOTAL 35 42 --  CKMB 1.7 1.7 --  TROPONINI <0.30 <0.30 --    Estimated Creatinine Clearance: 59 ml/min (by C-G formula based on Cr of 0.85).   Assessment:  84 YOF started on heparin for atrial fibrillation.  No evidence of this on previous record but MD suspecting pt is going in an out of afib.    First heparin level above therapeutic goal with heparin rate at 1200 units/hr.  CBC stable.  No bleeding/complications reported.  Goal of Therapy:  Heparin level 0.3-0.7 units/ml Monitor platelets by anticoagulation protocol: Yes   Plan:  Reduce heparin rate to 1050 units/hr (10.5 mL/hr). Recheck heparin level in 8 hours. F/u decision for long-term oral anticoagulation plan.  Clance Boll 03/19/2012,10:11 AM

## 2012-03-20 LAB — BASIC METABOLIC PANEL
CO2: 28 mEq/L (ref 19–32)
Calcium: 9.1 mg/dL (ref 8.4–10.5)
Creatinine, Ser: 0.99 mg/dL (ref 0.50–1.10)
Glucose, Bld: 110 mg/dL — ABNORMAL HIGH (ref 70–99)

## 2012-03-20 LAB — CBC
MCH: 29.6 pg (ref 26.0–34.0)
MCV: 87.4 fL (ref 78.0–100.0)
Platelets: 223 10*3/uL (ref 150–400)
RBC: 3.65 MIL/uL — ABNORMAL LOW (ref 3.87–5.11)

## 2012-03-20 MED ORDER — LEVALBUTEROL HCL 0.63 MG/3ML IN NEBU
0.6300 mg | INHALATION_SOLUTION | Freq: Three times a day (TID) | RESPIRATORY_TRACT | Status: DC
  Administered 2012-03-20 – 2012-03-21 (×6): 0.63 mg via RESPIRATORY_TRACT
  Filled 2012-03-20 (×7): qty 3

## 2012-03-20 MED ORDER — DILTIAZEM HCL ER COATED BEADS 180 MG PO CP24
180.0000 mg | ORAL_CAPSULE | Freq: Every day | ORAL | Status: DC
  Administered 2012-03-20 – 2012-03-23 (×4): 180 mg via ORAL
  Filled 2012-03-20 (×4): qty 1

## 2012-03-20 MED ORDER — ATORVASTATIN CALCIUM 20 MG PO TABS
20.0000 mg | ORAL_TABLET | Freq: Every day | ORAL | Status: DC
  Administered 2012-03-20 – 2012-03-22 (×3): 20 mg via ORAL
  Filled 2012-03-20 (×4): qty 1

## 2012-03-20 NOTE — Clinical Social Work Psychosocial (Unsigned)
     Clinical Social Work Department BRIEF PSYCHOSOCIAL ASSESSMENT 03/20/2012  Patient:  Carrie Best, Carrie Best     Account Number:  [id number]     Admit date:  03/18/2012  Clinical Social Worker:  Hattie Perch  Date/Time:  03/20/2012 12:00 M  Referred by:  Physician  Date Referred:  03/20/2012 Referred for  SNF Placement   Other Referral:   Interview type:  Patient Other interview type:    PSYCHOSOCIAL DATA Living Status:  ALONE Admitted from facility:   Level of care:   Primary support name:  Susie Swinson Primary support relationship to patient:  CHILD, ADULT Degree of support available:   good    CURRENT CONCERNS Current Concerns  Post-Acute Placement   Other Concerns:    SOCIAL WORK ASSESSMENT / PLAN CSW met with patient. patient is alert and oriented X3. patient was previously at [facility] for a week for SNF. patient then went home and had difficulty breathing. patient unsure if she will need snf upon discharge or not.   Assessment/plan status:   Other assessment/ plan:   Information/referral to community resources:    PATIENTS/FAMILYS RESPONSE TO PLAN OF CARE: Patient agreeable to CSW following with treatment team to see if patient will need snf once seen by physical therapy.

## 2012-03-20 NOTE — Progress Notes (Signed)
  Echocardiogram 2D Echocardiogram has been performed.  Carrie Best 03/20/2012, 11:34 AM

## 2012-03-20 NOTE — Clinical Documentation Improvement (Signed)
RESPIRATORY FAILURE DOCUMENTATION CLARIFICATION QUERY   THIS DOCUMENT IS NOT A PERMANENT PART OF THE MEDICAL RECORD  TO RESPOND TO THE THIS QUERY, FOLLOW THE INSTRUCTIONS BELOW:  1. If needed, update documentation for the patient's encounter via the notes activity.  2. Access this query again and click edit on the In [employer].  3. After updating, or not, click F2 to complete all highlighted (required) fields concerning your review. Select "additional documentation in the medical record" OR "no additional documentation provided".  4. Click Sign note button.  5. The deficiency will fall out of your In Basket *Please let us know if you are not able to complete this workflow by phone or e-mail (listed below).  Please update your documentation within the medical record to reflect your response to this query.                                                                                    03/20/12  Dear Dr.Griffin Marton Redwood,  In a better effort to capture your patient's severity of illness, reflect appropriate length of stay and utilization of resources, a review of the patient medical record has revealed the following indicators.    Based on your clinical judgment, please clarify and document in a progress note and/or discharge summary the clinical condition associated with the following supporting information:  In responding to this query please exercise your independent judgment.  The fact that a query is asked, does not imply that any particular answer is desired or expected. Pt admitted with PNA and acute on chronic diastole CHF. According to  ED note pt had "hypoxia" For accurate  specificity & severity, if possible , please specify if " hypoxia"could  be further clarified in progress notes as any of listings below Possible Clinical Conditions? Acute Respiratory Failure  Acute on Chronic Respiratory Failure  Chronic Respiratory Failure  Acute Respiratory Insufficiency     Other Condition (please specify)  Cannot Clinically Determine    Supporting Information: Risk Factors:CHF, HTN, Dyspnea, PNA  Signs&Symptoms:  per ED note: "Mild shortness of breath. She states that she was using supplemental oxygen at [facility] [employer] (rehab for recent falls) but went home yesterday without oxygen she didn't feel well without the oxygen"  per Nsg notes 03/19/12 : " Tachypnea, dyspnea at rest and dyspnea on exertion"  " Expiratory wheezing  O2 @2L   with sats 96%-98%" per pn 03/20/12 : "occasional rhonchi breath sounds and feels patient needs more frequent nebulizer treatment"  Diagnostics:  BNP of 2341  -O2 @ 2L/[place]  Radiology:03/18/12  Findings: New areas of airspace opacity are seen in the right upper lobe and left perihilar region.  Small pleural effusions are seen bilaterally with associated mild bibasilar atelectasis.  Mild cardiomegaly remains stable  Treatment:  Oxygen, Lasix,  Respiratory Treatment: Xopenex neb Q8 hr   You may use possible, probable, or suspect with inpatient documentation. possible, probable, suspected diagnoses MUST be documented at the time of discharge  Reviewed:  no additional documentation provided  (GT)  No MD response 7/19 /13  Thank You,  Andy Gauss RN  Clinical Documentation Specialist:  Pager [phone] E-mail [email or web address]  Health Information Management [place]

## 2012-03-20 NOTE — Evaluation (Signed)
Physical Therapy Evaluation Patient Details Name: Carrie Best MRN: [id number] DOB: Apr 24, 1928 Today's Date: 03/20/2012 Time: 4098-1191 PT Time Calculation (min): 26 min  PT Assessment / Plan / Recommendation Clinical Impression  78 yo female admitted with Pna. [facility] a few days ago before having to be admitted at [facility]. Pt was peforming stand-pivot transfers only and mobilizing at wheelchair level in home/community with stand-by assist per daughter. Plan is for pt to return home with daughter providing 24 hour supervision/assist. Recommend HHPT.     PT Assessment  Patient needs continued PT services    Follow Up Recommendations  Home health PT;Supervision/Assistance - 24 hour    Barriers to Discharge        Equipment Recommendations  None recommended by PT    Recommendations for Other Services OT consult   Frequency Min 3X/week    Precautions / Restrictions Precautions Precautions: Fall-multiple  Restrictions Weight Bearing Restrictions: No   Pertinent Vitals/Pain       Mobility  Bed Mobility Bed Mobility: Supine to Sit Supine to Sit: HOB elevated;With rails;5: Supervision Transfers Transfers: Sit to Stand;Stand to Sit;Stand Pivot Transfers Sit to Stand: 4: Min assist;From elevated surface;From bed Stand to Sit: 4: Min assist;To chair/3-in-1;With armrests;With upper extremity assist Stand Pivot Transfers: 4: Min assist;With armrests Details for Transfer Assistance: VCs safety, technique, hand placement. Assist to rise, stabilize, control descent, maintain stability during pivot bed>recliner.  Ambulation/Gait Ambulation/Gait Assistance: Not tested (comment) Ambulation/Gait Assistance Details: Pt non-ambulatory due to fall risk/safety/fall history.     Exercises     PT Diagnosis: Difficulty walking;Generalized weakness  PT Problem List: Decreased balance;Decreased mobility;Decreased activity tolerance;Decreased knowledge  of use of DME;Decreased strength PT Treatment Interventions: DME instruction;Gait training;Functional mobility training;Therapeutic activities;Therapeutic exercise;Balance training;Patient/family education;Wheelchair mobility training   PT Goals Acute Rehab PT Goals PT Goal Formulation: With patient/family Time For Goal Achievement: 03/27/12 Potential to Achieve Goals: Good Pt will go Supine/Side to Sit: with supervision PT Goal: Supine/Side to Sit - Progress: Goal set today Pt will go Sit to Supine/Side: with supervision PT Goal: Sit to Supine/Side - Progress: Goal set today Pt will go Sit to Stand: with supervision PT Goal: Sit to Stand - Progress: Goal set today Pt will Transfer Bed to Chair/Chair to Bed: with supervision PT Transfer Goal: Bed to Chair/Chair to Bed - Progress: Goal set today Pt will Propel Wheelchair: 51 - 150 feet;with supervision PT Goal: Propel Wheelchair - Progress: Goal set today  Visit Information  Last PT Received On: 03/20/12 Assistance Needed: +1    Subjective Data  Subjective: "I was just trying to impress you" Patient Stated Goal: Home. Get better   Prior Functioning  Home Living Lives With: Daughter Available Help at Discharge: Family Type of Home: House Home Access: Ramped entrance Home Layout: One level Home Adaptive Equipment: Walker - rolling;Wheelchair - manual Prior Function Level of Independence: Needs assistance Needs Assistance: Bathing;Dressing;Transfers;Toileting Bath: Supervision/set-up Dressing: Supervision/set-up Toileting: Supervision/set-up Transfer Assistance: Stand-by assist for transfers to/from wheelchair.  Used wheelchair to propel through home. Non-ambulatory due to severe fall risk and history of multiple falls Able to Take Stairs?: No Driving: No Communication Communication: No difficulties    Cognition  Overall Cognitive Status: Impaired Area of Impairment: Memory Arousal/Alertness: Awake/alert Orientation  Level: Appears intact for tasks assessed Behavior During Session: [facility] for tasks performed Memory Deficits: Difficulty provideing history (home environment, PLOF). Daughter present and provided information. Cognition - Other Comments: Increased time to process  Extremity/Trunk Assessment Right Lower Extremity Assessment RLE ROM/Strength/Tone: Deficits RLE ROM/Strength/Tone Deficits: Strength 4/5 throughout.  Left Lower Extremity Assessment LLE ROM/Strength/Tone: Deficits LLE ROM/Strength/Tone Deficits: Strength 4/5 throughout.    Balance Balance Balance Assessed: Yes Static Sitting Balance Static Sitting - Balance Support: Bilateral upper extremity supported;Feet supported Static Sitting - Level of Assistance: 5: Stand by assistance Static Sitting - Comment/# of Minutes: Increase posterior sway/leaning during MMT.  Static Standing Balance Static Standing - Balance Support: Left upper extremity supported Static Standing - Level of Assistance: 4: Min assist  End of Session PT - End of Session Equipment Utilized During Treatment: Gait belt Activity Tolerance: Patient tolerated treatment well Patient left: in chair;with chair alarm set;with family/visitor present  GP     Rebeca Alert [facility] 03/20/2012, 9:11 AM [phone]

## 2012-03-20 NOTE — Progress Notes (Signed)
Subjective: Feels better, less cough, and no diarrhea  Objective: Vital signs in last 24 hours: Temp:  [98.5 F (36.9 C)-99.2 F (37.3 C)] 98.7 F (37.1 C) (07/14 2200) Pulse Rate:  [92-99] 99  (07/14 2200) Resp:  [20-21] 20  (07/14 2200) BP: (114-138)/(68-81) 126/68 mmHg (07/14 2200) SpO2:  [96 %-97 %] 96 % (07/14 2200) Weight:  [87.2 kg (192 lb 3.9 oz)] 87.2 kg (192 lb 3.9 oz) (07/14 0600) Weight change:  Last BM Date: 03/19/12  Intake/Output from previous day: 07/14 0701 - 07/15 0700 In: 1688 [P.O.:240; I.V.:1448] Out: 850 [Urine:850] Intake/Output this shift: Total I/O In: 100 [I.V.:100] Out: -   General appearance: alert Resp: wheezes anterior - bilateral Cardio: irregularly irregular rhythm GI: soft, non-tender; bowel sounds normal; no masses,  no organomegaly Extremities: extremities normal, atraumatic, no cyanosis or edema  Lab Results:  Basename 03/20/12 0506 03/19/12 0040  WBC 11.0* 14.8*  HGB 10.8* 12.1  HCT 31.9* 35.9*  PLT 223 243   BMET  Basename 03/19/12 1155 03/18/12 1625  NA 135 134*  K 2.6* 2.4*  CL 95* 93*  CO2 29 29  GLUCOSE 147* 113*  BUN 9 9  CREATININE 0.92 0.85  CALCIUM 8.8 9.1    Studies/Results: Dg Chest 2 View  03/19/2012  *RADIOLOGY REPORT*  Clinical Data: Pneumonia.  Congestive heart failure.  CHEST - 2 VIEW  Comparison: 03/18/2012  Findings: Improved aeration of both lung bases is seen.  There is persistent infiltrate is seen in the peripheral left mid lung zone which is not significantly changed in appearance.  Mild cardiomegaly is stable and there is no definite evidence of congestive heart failure.  Small pleural effusions are again seen bilaterally, without significant change.  IMPRESSION:  1.  No significant change in the left mid lung infiltrate and small bilateral pleural effusions. 2.  Stable cardiomegaly.  Original Report Authenticated By: Danae Orleans, M.D.   Dg Chest 2 View  03/18/2012  *RADIOLOGY REPORT*  Clinical  Data: Cough and fever.  CHEST - 2 VIEW  Comparison: 05/16/2008  Findings: New areas of airspace opacity are seen in the right upper lobe and left perihilar region.  Small pleural effusions are seen bilaterally with associated mild bibasilar atelectasis.  Mild cardiomegaly remains stable.  IMPRESSION:  1.  New ill-defined areas of airspace opacity in the right upper lobe and left perihilar region, suspicious for multilobar pneumonia. 2.  Small bilateral pleural effusions and mild bibasilar atelectasis.  Original Report Authenticated By: Danae Orleans, M.D.    Medications: I have reviewed the patient's current medications.  Assessment/Plan: Principal Problem:  *Healthcare-associated pneumonia  Improved, continue current regimen, PT consult Active Problems:  Acute on chronic diastolic congestive heart failure improved, echo pending. PO lasix  Atrial Fibrillation, rate not optimallycontrolled on Beta blocker, D/C felodipine and start diltiazem and on lovenox  Diarrhea resolved, C Diff negative  HTN (hypertension) ok    LOS: 2 days   Carrie Best JOSEPH 03/20/2012, 5:58 AM

## 2012-03-21 LAB — CBC
Hemoglobin: 10.7 g/dL — ABNORMAL LOW (ref 12.0–15.0)
Platelets: 216 10*3/uL (ref 150–400)
RBC: 3.75 MIL/uL — ABNORMAL LOW (ref 3.87–5.11)
WBC: 9.8 10*3/uL (ref 4.0–10.5)

## 2012-03-21 LAB — BASIC METABOLIC PANEL
BUN: 12 mg/dL (ref 6–23)
Chloride: 98 mEq/L (ref 96–112)
Creatinine, Ser: 0.97 mg/dL (ref 0.50–1.10)
Glucose, Bld: 131 mg/dL — ABNORMAL HIGH (ref 70–99)

## 2012-03-21 LAB — TSH: TSH: 0.741 u[IU]/mL (ref 0.350–4.500)

## 2012-03-21 LAB — VANCOMYCIN, TROUGH: Vancomycin Tr: 13.7 ug/mL (ref 10.0–20.0)

## 2012-03-21 MED ORDER — MENTHOL 3 MG MT LOZG
1.0000 | LOZENGE | OROMUCOSAL | Status: DC | PRN
  Filled 2012-03-21 (×2): qty 9

## 2012-03-21 MED ORDER — PNEUMOCOCCAL VAC POLYVALENT 25 MCG/0.5ML IJ INJ
0.5000 mL | INJECTION | INTRAMUSCULAR | Status: AC
  Administered 2012-03-22: 0.5 mL via INTRAMUSCULAR
  Filled 2012-03-21: qty 0.5

## 2012-03-21 MED ORDER — ZOLPIDEM TARTRATE 5 MG PO TABS: 5.0000 mg | ORAL_TABLET | Freq: Every evening | ORAL | Status: DC | PRN

## 2012-03-21 MED ORDER — POTASSIUM CHLORIDE CRYS ER 20 MEQ PO TBCR
20.0000 meq | EXTENDED_RELEASE_TABLET | Freq: Every day | ORAL | Status: DC
  Administered 2012-03-21: 20 meq via ORAL
  Filled 2012-03-21 (×2): qty 1

## 2012-03-21 MED ORDER — VANCOMYCIN HCL IN DEXTROSE 1-5 GM/200ML-% IV SOLN
1000.0000 mg | Freq: Two times a day (BID) | INTRAVENOUS | Status: DC
  Administered 2012-03-21 (×2): 1000 mg via INTRAVENOUS
  Filled 2012-03-21 (×3): qty 200

## 2012-03-21 NOTE — Plan of Care (Signed)
Problem: Phase II Progression Outcomes Goal: Walk in hall or up in chair TID Outcome: Progressing Up in chair twice today

## 2012-03-21 NOTE — Plan of Care (Signed)
Problem: Phase I Progression Outcomes Goal: First antibiotic given within 6hrs of admit Outcome: Completed/Met Date Met:  03/21/12 Received maxipime given 03/18/12 at 2122

## 2012-03-21 NOTE — Progress Notes (Signed)
ANTIBIOTIC CONSULT NOTE - FOLLOW UP  Pharmacy Consult for Vancomycin/Cefepime Indication: HCAP  Allergies  Allergen Reactions  . Codeine Nausea And Vomiting  . Penicillins Hives  . Sulfur Nausea And Vomiting    Patient Measurements: Height: 5\' 8"  (172.7 cm) Weight: 197 lb 1.5 oz (89.4 kg) IBW/kg (Calculated) : 63.9   Vital Signs: Temp: 97.9 F (36.6 C) (07/16 0600) Temp src: Oral (07/16 0600) BP: 149/88 mmHg (07/16 0600) Pulse Rate: 91  (07/16 0600) Intake/Output from previous day: 07/15 0701 - 07/16 0700 In: 1000 [IV Piggyback:1000] Out: 250 [Urine:250] Intake/Output from this shift:    Labs:  Basename 03/21/12 0450 03/20/12 0506 03/19/12 1155 03/19/12 0040  WBC 9.8 11.0* -- 14.8*  HGB 10.7* 10.8* -- 12.1  PLT 216 223 -- 243  LABCREA -- -- -- --  CREATININE 0.97 0.99 0.92 --   Estimated Creatinine Clearance: 52.3 ml/min (by C-G formula based on Cr of 0.97).  50 ml/min/1.63m2 (normalized)  Basename 03/21/12 0840 03/20/12 2053  VANCOTROUGH 13.7 32.1*  VANCOPEAK -- --  Drue Dun -- --  GENTTROUGH -- --  GENTPEAK -- --  GENTRANDOM -- --  TOBRATROUGH -- --  TOBRAPEAK -- --  TOBRARND -- --  AMIKACINPEAK -- --  AMIKACINTROU -- --  AMIKACIN -- --     Microbiology: Recent Results (from the past 720 hour(s))  CULTURE, BLOOD (ROUTINE X 2)     Status: Normal (Preliminary result)   Collection Time   03/18/12  4:25 PM      Component Value Range Status Comment   Specimen Description BLOOD LEFT ANTECUBITAL   Final    Special Requests BOTTLES DRAWN AEROBIC AND ANAEROBIC 5CC EACH   Final    Culture  Setup Time 03/18/2012 21:39   Final    Culture     Final    Value:        BLOOD CULTURE RECEIVED NO GROWTH TO DATE CULTURE WILL BE HELD FOR 5 DAYS BEFORE ISSUING A FINAL NEGATIVE REPORT   Report Status PENDING   Incomplete   CULTURE, BLOOD (ROUTINE X 2)     Status: Normal (Preliminary result)   Collection Time   03/18/12  5:10 PM      Component Value Range Status  Comment   Specimen Description BLOOD LEFT HAND   Final    Special Requests BOTTLES DRAWN AEROBIC AND ANAEROBIC 5CC EACH   Final    Culture  Setup Time 03/18/2012 21:39   Final    Culture     Final    Value:        BLOOD CULTURE RECEIVED NO GROWTH TO DATE CULTURE WILL BE HELD FOR 5 DAYS BEFORE ISSUING A FINAL NEGATIVE REPORT   Report Status PENDING   Incomplete   CLOSTRIDIUM DIFFICILE BY PCR     Status: Normal   Collection Time   03/18/12  8:16 PM      Component Value Range Status Comment   C difficile by pcr NEGATIVE  NEGATIVE Final   CULTURE, EXPECTORATED SPUTUM-ASSESSMENT     Status: Normal   Collection Time   03/19/12 11:08 AM      Component Value Range Status Comment   Specimen Description SPUTUM   Final    Special Requests NONE   Final    Sputum evaluation     Final    Value: THIS SPECIMEN IS ACCEPTABLE. RESPIRATORY CULTURE REPORT TO FOLLOW.   Report Status 03/19/2012 FINAL   Final   CULTURE, RESPIRATORY  Status: Normal (Preliminary result)   Collection Time   03/19/12 11:08 AM      Component Value Range Status Comment   Specimen Description SPUTUM   Final    Special Requests NONE   Final    Gram Stain     Final    Value: ABUNDANT WBC PRESENT,BOTH PMN AND MONONUCLEAR     NO SQUAMOUS EPITHELIAL CELLS SEEN     NO ORGANISMS SEEN   Culture Culture reincubated for better growth   Final    Report Status PENDING   Incomplete     Anti-infectives     Start     Dose/Rate Route Frequency Ordered Stop   03/19/12 0045   vancomycin (VANCOCIN) 750 mg in sodium chloride 0.9 % 150 mL IVPB        750 mg 150 mL/hr over 60 Minutes Intravenous 2 times daily 03/19/12 0030     03/19/12 0030   levofloxacin (LEVAQUIN) IVPB 750 mg        750 mg 100 mL/hr over 90 Minutes Intravenous Daily at bedtime 03/19/12 0013 03/20/12 2309   03/19/12 0013   ceFEPIme (MAXIPIME) 1 g in dextrose 5 % 50 mL IVPB  Status:  Discontinued        1 g 100 mL/hr over 30 Minutes Intravenous 3 times per day 03/19/12  0013 03/19/12 0024   03/18/12 1845   ceFEPIme (MAXIPIME) 2 g in dextrose 5 % 50 mL IVPB        2 g 100 mL/hr over 30 Minutes Intravenous Every 12 hours 03/18/12 1808            Assessment:  84 YOF on day #3 vancomycin/cefepime, completed 3 days levaquin for HCAP.  Vanc trough reported as 32.1 last night, but was drawn after dose given, therefore is inaccurate.  Trough today = 13.7 which is slightly below goal.  MD plans noted to change to po antibiotics tomorrow.  Goal of Therapy:  Vancomycin trough level 15-20 mcg/ml  Plan:   Increase vancomycin 1gm IV q12h  Continue cefepime as ordered Follow up renal function & cultures  Loralee Pacas, PharmD, BCPS Pager: [phone] 03/21/2012,10:01 AM

## 2012-03-21 NOTE — Plan of Care (Signed)
Problem: Phase I Progression Outcomes Goal: EF % per last Echo/documented,Core Reminder form on chart Outcome: Adequate for Discharge Echocardiogram 50-55% EF done 03/21/12

## 2012-03-21 NOTE — Progress Notes (Signed)
Subjective: Difficult to awaken this am  Objective: Vital signs in last 24 hours: Temp:  [97.3 F (36.3 C)-99.5 F (37.5 C)] 99.3 F (37.4 C) (07/15 2200) Pulse Rate:  [92-102] 101  (07/16 0027) Resp:  [18-20] 18  (07/16 0027) BP: (130-176)/(78-85) 130/78 mmHg (07/15 2200) SpO2:  [92 %-99 %] 92 % (07/16 0027) Weight:  [87.5 kg (192 lb 14.4 oz)] 87.5 kg (192 lb 14.4 oz) (07/15 0600) Weight change:  Last BM Date: 03/19/12  Intake/Output from previous day: 07/15 0701 - 07/16 0700 In: 1000 [IV Piggyback:1000] Out: 250 [Urine:250] Intake/Output this shift: Total I/O In: 1000 [IV Piggyback:1000] Out: -   General appearance: slowed mentation Resp: wheezes anterior - bilateral Cardio: irregularly irregular rhythm GI: soft, non-tender; bowel sounds normal; no masses,  no organomegaly Extremities: extremities normal, atraumatic, no cyanosis or edema  Lab Results:  [facility] 03/21/12 0450 03/20/12 0506  WBC 9.8 11.0*  HGB 10.7* 10.8*  HCT 32.6* 31.9*  PLT 216 223   BMET  Basename 03/21/12 0450 03/20/12 0506  NA 136 135  K 4.4 4.2  CL 98 99  CO2 29 28  GLUCOSE 131* 110*  BUN 12 10  CREATININE 0.97 0.99  CALCIUM 9.7 9.1    Studies/Results: Dg Chest 2 View  03/19/2012  *RADIOLOGY REPORT*  Clinical Data: Pneumonia.  Congestive heart failure.  CHEST - 2 VIEW  Comparison: 03/18/2012  Findings: Improved aeration of both lung bases is seen.  There is persistent infiltrate is seen in the peripheral left mid lung zone which is not significantly changed in appearance.  Mild cardiomegaly is stable and there is no definite evidence of congestive heart failure.  Small pleural effusions are again seen bilaterally, without significant change.  IMPRESSION:  1.  No significant change in the left mid lung infiltrate and small bilateral pleural effusions. 2.  Stable cardiomegaly.  Original Report Authenticated By: Danae Orleans, M.D.    Medications: I have reviewed the patient's current  medications.  Assessment/Plan: Principal Problem:  *Healthcare-associated pneumonia day 4 antibiotics, improved, change to po in am Active Problems:  Acute on chronic diastolic congestive heart failure stable on lasix  Diarrhea resolved  HTN (hypertension) ok  Atrial fibrillation rate controlled. Echo noted. Consider coumadin as outpatient   LOS: 3 days   [facility]  [phone] 03/21/2012, 5:45 AM

## 2012-03-21 NOTE — Progress Notes (Signed)
Physical Therapy Treatment Patient Details Name: Carrie Best MRN: [id number] DOB: 19-Mar-1928 Today's Date: 03/21/2012 Time: 4098-1191 PT Time Calculation (min): 9 min  PT Assessment / Plan / Recommendation Comments on Treatment Session  Pt sleepy but arouses easily. Bed to chair with min Assist. Pt participated well but then fell asleep right after transfering to recliner.     Follow Up Recommendations  Home health PT;Supervision/Assistance - 24 hour    Barriers to Discharge        Equipment Recommendations  None recommended by PT    Recommendations for Other Services OT consult  Frequency Min 3X/week   Plan Discharge plan remains appropriate    Precautions / Restrictions Precautions Precautions: Fall Restrictions Weight Bearing Restrictions: No   Pertinent Vitals/Pain *no c/o pain**    Mobility  Bed Mobility Bed Mobility: Supine to Sit;Sitting - Scoot to Edge of Bed Supine to Sit: HOB elevated;With rails;3: Mod assist Sitting - Scoot to Edge of Bed: 4: Min assist Transfers Transfers: Sit to Stand;Stand to [employer] Transfers Sit to Stand: 4: Min assist;From elevated surface;From bed Stand to Sit: 4: Min assist;To chair/3-in-1;With armrests;With upper extremity assist Stand Pivot Transfers: 4: Min assist;With armrests Details for Transfer Assistance: VCs safety, technique, hand placement. Assist to rise, stabilize, control descent, maintain stability during pivot bed>recliner.  Ambulation/Gait Ambulation/Gait Assistance: Not tested (comment)    Exercises     PT Diagnosis:    PT Problem List:   PT Treatment Interventions:     PT Goals Acute Rehab PT Goals PT Goal Formulation: With patient/family Time For Goal Achievement: 03/27/12 Potential to Achieve Goals: Good Pt will go Supine/Side to Sit: with supervision PT Goal: Supine/Side to Sit - Progress: Progressing toward goal Pt will go Sit to Supine/Side: with supervision Pt will go Sit to Stand: with  supervision PT Goal: Sit to Stand - Progress: Progressing toward goal Pt will Transfer Bed to Chair/Chair to Bed: with supervision PT Transfer Goal: Bed to Chair/Chair to Bed - Progress: Progressing toward goal Pt will Ambulate: 1 - 15 feet;with min assist;with least restrictive assistive device Pt will Propel Wheelchair: 51 - 150 feet;with supervision  Visit Information  Last PT Received On: 03/21/12 Assistance Needed: +1    Subjective Data  Subjective: I'm so sleepy.  Patient Stated Goal: none stated   Cognition  Overall Cognitive Status: Impaired Area of Impairment: Memory Arousal/Alertness: Lethargic Orientation Level: Appears intact for tasks assessed Behavior During Session: [facility] for tasks performed Cognition - Other Comments: Increased time to process    Balance  Balance Balance Assessed: Yes Static Sitting Balance Static Sitting - Balance Support: Bilateral upper extremity supported;Feet supported Static Sitting - Level of Assistance: 5: Stand by assistance Static Standing Balance Static Standing - Balance Support: Left upper extremity supported Static Standing - Level of Assistance: 4: Min assist  End of Session PT - End of Session Equipment Utilized During Treatment: Gait belt Activity Tolerance: Patient tolerated treatment well Patient left: in chair;with chair alarm set;with call bell/phone within reach Nurse Communication: Mobility status   GP     Ralene Bathe Kistler 03/21/2012, 12:06 PM [phone]

## 2012-03-21 NOTE — Plan of Care (Signed)
Problem: Consults Goal: Heart Failure Patient Education (See Patient Education module for education specifics.)  Outcome: Completed/Met Date Met:  03/21/12 Information given to the patient's daughter whom the patient lives with.  The daughter verbalized understanding and states she has reviewed and understands the teaching materials she was given over the weekend

## 2012-03-22 LAB — CBC
HCT: 31.8 % — ABNORMAL LOW (ref 36.0–46.0)
Hemoglobin: 10.6 g/dL — ABNORMAL LOW (ref 12.0–15.0)
MCV: 86.9 fL (ref 78.0–100.0)
RBC: 3.66 MIL/uL — ABNORMAL LOW (ref 3.87–5.11)
RDW: 14.2 % (ref 11.5–15.5)
WBC: 9.4 10*3/uL (ref 4.0–10.5)

## 2012-03-22 LAB — BASIC METABOLIC PANEL
CO2: 33 mEq/L — ABNORMAL HIGH (ref 19–32)
Chloride: 94 mEq/L — ABNORMAL LOW (ref 96–112)
GFR calc Af Amer: 64 mL/min — ABNORMAL LOW (ref >90)
Potassium: 3.3 mEq/L — ABNORMAL LOW (ref 3.5–5.1)
Sodium: 134 mEq/L — ABNORMAL LOW (ref 135–145)

## 2012-03-22 LAB — CULTURE, RESPIRATORY W GRAM STAIN

## 2012-03-22 MED ORDER — MOXIFLOXACIN HCL 400 MG PO TABS
400.0000 mg | ORAL_TABLET | Freq: Every day | ORAL | Status: DC
  Administered 2012-03-22 – 2012-03-23 (×2): 400 mg via ORAL
  Filled 2012-03-22 (×4): qty 1

## 2012-03-22 MED ORDER — BENZONATATE 100 MG PO CAPS
100.0000 mg | ORAL_CAPSULE | Freq: Three times a day (TID) | ORAL | Status: DC | PRN
  Filled 2012-03-22: qty 1

## 2012-03-22 MED ORDER — LEVALBUTEROL HCL 0.63 MG/3ML IN NEBU
0.6300 mg | INHALATION_SOLUTION | Freq: Three times a day (TID) | RESPIRATORY_TRACT | Status: DC
  Administered 2012-03-22 – 2012-03-23 (×4): 0.63 mg via RESPIRATORY_TRACT
  Filled 2012-03-22 (×7): qty 3

## 2012-03-22 MED ORDER — POTASSIUM CHLORIDE CRYS ER 20 MEQ PO TBCR
20.0000 meq | EXTENDED_RELEASE_TABLET | Freq: Three times a day (TID) | ORAL | Status: DC
  Administered 2012-03-22 – 2012-03-23 (×4): 20 meq via ORAL
  Filled 2012-03-22 (×6): qty 1

## 2012-03-22 NOTE — Evaluation (Signed)
Occupational Therapy Evaluation Patient Details Name: Carrie Best MRN: [id number] DOB: [birth year] Today's Date: 03/22/2012 Time: 1040-1056 OT Time Calculation (min): 16 min  OT Assessment / Plan / Recommendation Clinical Impression  78 yo female admitted with Pna. Pt recently discharged from [facility]) a few days ago before having to be admitted at [facility]. Pt was peforming stand-pivot transfers only and mobilizing at wheelchair level in home/community with stand-by assist per daughter. Required stand by assist for ADL also. Plan is for pt to return home with daughter providing 24 hour supervision/assist. Recommend HHOT.     OT Assessment  Patient needs continued OT Services    Follow Up Recommendations  Home health OT;Supervision/Assistance - 24 hour    Barriers to Discharge      Equipment Recommendations  None recommended by PT;None recommended by OT    Recommendations for Other Services    Frequency  Min 2X/week    Precautions / Restrictions Precautions Precautions: Fall Restrictions Weight Bearing Restrictions: No        ADL  Eating/Feeding: Simulated;Independent Where Assessed - Eating/Feeding: Bed level Grooming: Simulated;Wash/dry hands;Set up;Supervision/safety Where Assessed - Grooming: Unsupported sitting Upper Body Bathing: Simulated;Chest;Right arm;Left arm;Abdomen;Supervision/safety;Set up Where Assessed - Upper Body Bathing: Unsupported sitting Lower Body Bathing: Simulated;Minimal assistance;Other (comment) (for standing balance.) Where Assessed - Lower Body Bathing: Supported sit to stand Upper Body Dressing: Simulated;Supervision/safety;Set up Where Assessed - Upper Body Dressing: Unsupported sitting Lower Body Dressing: Simulated;Minimal assistance;Other (comment) (for balance) Where Assessed - Lower Body Dressing: Supported sit to stand Toilet Transfer: Performed;Minimal assistance (mod verbal cues for hand placement and technique.  ) Toilet Transfer Method: Stand pivot;Other (comment) (bed to [facility] then to recliner. using armrests) Toilet Transfer Equipment: Bedside commode Toileting - Clothing Manipulation and Hygiene: Performed;Minimal assistance Where Assessed - Toileting Clothing Manipulation and Hygiene: Sit to stand from 3-in-1 or toilet Tub/Shower Transfer Method: Not assessed ADL Comments: Pt pleasant but decreaed attention noted. She has difficulty answering questions in a straight forward manner. She requires increased time to think about questions before responding and at times appears pt may not completely understand OT's questions. She requires min assist to balance for any standing activity as knees tend to bucle and pt unsteady.     OT Diagnosis: Generalized weakness  OT Problem List: Decreased strength;Impaired balance (sitting and/or standing);Decreased knowledge of use of DME or AE;Decreased cognition OT Treatment Interventions: Self-care/ADL training;Therapeutic activities;DME and/or AE instruction;Patient/family education   OT Goals Acute Rehab OT Goals OT Goal Formulation: With patient Time For Goal Achievement: 04/05/12 Potential to Achieve Goals: Good ADL Goals Pt Will Perform Lower Body Bathing: with supervision;Sit to stand from chair;Sit to stand from bed ADL Goal: Lower Body Bathing - Progress: Goal set today Pt Will Perform Lower Body Dressing: with supervision;Sit to stand from bed;Sit to stand from chair ADL Goal: Lower Body Dressing - Progress: Goal set today Pt Will Transfer to Toilet: with supervision;Stand pivot transfer;with DME;Other (comment) (to [facility] next to bed) ADL Goal: Toilet Transfer - Progress: Goal set today Pt Will Perform Toileting - Clothing Manipulation: with supervision;Standing ADL Goal: Toileting - Clothing Manipulation - Progress: Goal set today  Visit Information  Last OT Received On: 03/22/12 Assistance Needed: +1    Subjective Data  Subjective: doing  ok Patient Stated Goal: none stated   Prior Functioning  Vision/Perception  Home Living Lives With: Daughter Available Help at Discharge: Family Type of Home: House Home Access: Ramped entrance Home Layout: One level Home Adaptive Equipment:  Walker - rolling;Wheelchair - manual;Bedside commode/3-in-1;Shower chair with back Additional Comments: difficult to get information from pt. She jumps around with answering questions and doesnt give concise reponses. Pt reports having 3in1 and appears she is reporting she has a shower chair that she uses to sit at sink at sponge bathe. She reports using BSC next to bed.  Prior Function Level of Independence: Needs assistance Needs Assistance: Bathing;Dressing;Transfers;Toileting Bath: Supervision/set-up Dressing: Supervision/set-up Toileting: Supervision/set-up Transfer Assistance: Stand-by assist for transfers to/from wheelchair.  Used wheelchair to propel through home. Non-ambulatory due to severe fall risk and history of multiple falls. also stand by assist for pivot transfers to [facility] and onto chair to bathe at sink.  Able to Take Stairs?: No Driving: No Communication Communication: No difficulties Dominant Hand: Right      Cognition  Overall Cognitive Status: Impaired Area of Impairment: Attention;Memory;Problem solving Arousal/Alertness: Awake/alert Orientation Level: Appears intact for tasks assessed Behavior During Session: [facility] for tasks performed Current Attention Level: Selective Memory Deficits: difficulty providing information regarding PLOF and home environment.  Cognition - Other Comments: increased time to process information and respond    Extremity/Trunk Assessment Right Upper Extremity Assessment RUE ROM/Strength/Tone: [facility] for tasks assessed Left Upper Extremity Assessment LUE ROM/Strength/Tone: [facility] for tasks assessed   Mobility Bed Mobility Bed Mobility: Supine to Sit Supine to Sit: HOB elevated;With rails;5:  Supervision Sitting - Scoot to Edge of Bed: 5: Supervision Details for Bed Mobility Assistance: verbal cues and increased time. Transfers Sit to Stand: 4: Min assist Stand to Sit: 4: Min assist Details for Transfer Assistance: VCs safety, technique, hand placement. Assist to rise, stabilize, control descent, maintain stability during transfers. x2 bed>bsc, bsc>recliner      Balance Balance Balance Assessed: Yes Dynamic Standing Balance Dynamic Standing - Level of Assistance: 4: Min assist  End of Session OT - End of Session Equipment Utilized During Treatment: Gait belt Activity Tolerance: Patient tolerated treatment well Patient left: in chair;with chair alarm set;Other (comment) (with PT to finish session for exercises)  GO     Lennox Laity [phone] 03/22/2012, 12:27 PM

## 2012-03-22 NOTE — Plan of Care (Signed)
Problem: Phase II Progression Outcomes Goal: Encourage coughing & deep breathing Outcome: Progressing Nonproductive cough- could benefit from mucinex

## 2012-03-22 NOTE — Progress Notes (Signed)
Physical Therapy Treatment Patient Details Name: Carrie Best MRN: [id number] DOB: 01/17/1928 Today's Date: 03/22/2012 Time: 1042-1100 PT Time Calculation (min): 18 min  PT Assessment / Plan / Recommendation Comments on Treatment Session  Continues to require +1 assist for transfers. Noted some bil knee instability today. Recommend 24 hour supervision/assist and HHPT    Follow Up Recommendations  Home health PT;Supervision/Assistance - 24 hour    Barriers to Discharge        Equipment Recommendations  None recommended by PT    Recommendations for Other Services    Frequency Min 3X/week   Plan Discharge plan remains appropriate    Precautions / Restrictions Precautions Precautions: Fall Restrictions Weight Bearing Restrictions: No   Pertinent Vitals/Pain     Mobility  Bed Mobility Bed Mobility: Supine to Sit Supine to Sit: HOB elevated;With rails;5: Supervision Sitting - Scoot to Edge of Bed: 5: Supervision Details for Bed Mobility Assistance: VCs safety. Increased time.  Transfers Transfers: Sit to Stand;Stand to Sit Sit to Stand: 4: Min assist Stand to Sit: 4: Min assist Stand Pivot Transfers: 4: Min assist Details for Transfer Assistance: VCs safety, technique, hand placement. Assist to rise, stabilize, control descent, maintain stability during transfers. x2 bed>bsc, bsc>recliner Ambulation/Gait Ambulation/Gait Assistance: Not tested (comment)    Exercises General Exercises - Lower Extremity Ankle Circles/Pumps: AROM;15 reps;Seated;Both Long Arc Quad: AROM;Both;15 reps;Seated;Strengthening Hip Flexion/Marching: AROM;Strengthening;Both;15 reps;Seated   PT Diagnosis:    PT Problem List:   PT Treatment Interventions:     PT Goals Acute Rehab PT Goals Pt will go Supine/Side to Sit: with supervision PT Goal: Supine/Side to Sit - Progress: Progressing toward goal Pt will go Sit to Stand: with supervision PT Goal: Sit to Stand - Progress: Progressing toward  goal Pt will Transfer Bed to Chair/Chair to Bed: with supervision PT Transfer Goal: Bed to Chair/Chair to Bed - Progress: Progressing toward goal  Visit Information  Last PT Received On: 03/22/12 Assistance Needed: +1    Subjective Data  Subjective: "I think I'm going home today." Patient Stated Goal: None   Cognition  Overall Cognitive Status: Impaired Area of Impairment: Memory;Attention Arousal/Alertness: Awake/alert Orientation Level: Appears intact for tasks assessed Behavior During Session: [facility] for tasks performed Current Attention Level: Selective    Balance     End of Session PT - End of Session Equipment Utilized During Treatment: Gait belt Activity Tolerance: Patient tolerated treatment well Patient left: in chair;with chair alarm set   GP     Rebeca Alert [facility] 03/22/2012, 11:23 AM [phone]

## 2012-03-22 NOTE — Progress Notes (Signed)
ANTICOAGULATION CONSULT NOTE   Pharmacy Consult for Lovenox Indication: atrial fibrillation  Patient Measurements: Height: 5\' 8"  (172.7 cm) Weight: 194 lb 7.1 oz (88.2 kg) IBW/kg (Calculated) : 63.9   Labs:  Basename 03/22/12 0450 03/21/12 0450 03/20/12 0506 03/19/12 1705 03/19/12 0905  HGB 10.6* 10.7* -- -- --  HCT 31.8* 32.6* 31.9* -- --  PLT 247 216 223 -- --  APTT -- -- -- -- --  LABPROT -- -- -- -- --  INR -- -- -- -- --  HEPARINUNFRC -- -- -- -- 0.90*  CREATININE 0.94 0.97 0.99 -- --  CKTOTAL -- -- -- 42 35  CKMB -- -- -- 1.9 1.7  TROPONINI -- -- -- <0.30 <0.30    Estimated Creatinine Clearance: 53.6 ml/min (by C-G formula based on Cr of 0.94).  Assessment:  84 YOF started on heparin for atrial fibrillation.  No evidence of this on previous record but MD suspecting pt is going in an out of afib.  Started on IV heparin on admission, switched to Lovenox 7/14.  SCr WNL, CrCl>30 ml/min.  CBC stable.  No bleeding/complications reported.  Goal of Therapy:  Anti-Xa level 0.6-1.2 units/ml Monitor platelets by anticoagulation protocol: Yes   Plan:  Continue Lovenox 85 mg (~1 mg/kg) q12h.   MD plan noted to consider warfarin as outpatient  Loralee Pacas, PharmD, BCPS Pager: [phone] 03/22/2012,8:40 AM

## 2012-03-22 NOTE — Progress Notes (Signed)
Subjective: Feeling better  Objective: Vital signs in last 24 hours: Temp:  [98.5 F (36.9 C)] 98.5 F (36.9 C) (07/16 2230) Pulse Rate:  [103-117] 117  (07/16 2230) Resp:  [18-20] 18  (07/16 2230) BP: (130-162)/(72-86) 162/86 mmHg (07/16 2230) SpO2:  [94 %-98 %] 94 % (07/16 2316) Weight change:  Last BM Date: 03/19/12  Intake/Output from previous day: 07/16 0701 - 07/17 0700 In: 500 [IV Piggyback:500] Out: -  Intake/Output this shift: Total I/O In: 500 [IV Piggyback:500] Out: -   General appearance: alert and cooperative Resp: wheezes bilaterally Cardio: irregularly irregular rhythm Extremities: extremities normal, atraumatic, no cyanosis or edema  Lab Results:  Basename 03/22/12 0450 03/21/12 0450  WBC 9.4 9.8  HGB 10.6* 10.7*  HCT 31.8* 32.6*  PLT 247 216   BMET  Basename 03/22/12 0450 03/21/12 0450  NA 134* 136  K 3.3* 4.4  CL 94* 98  CO2 33* 29  GLUCOSE 121* 131*  BUN 13 12  CREATININE 0.94 0.97  CALCIUM 9.8 9.7    Studies/Results: No results found.  Medications: I have reviewed the patient's current medications.  Assessment/Plan: Principal Problem:  *Healthcare-associated pneumonia improved.  D/C IV antibiotcs and change to po avelox.  Continue bronchodilaters Active Problems:  Acute on chronic diastolic congestive heart failure stable on lasix  HTN (hypertension) ok  Atrial fibrillation rate controlled.  Consider coumadin as an outpatient Hypokalemia, replete po Disposition  Hope to D/C in next 24-48 hours with home health.  OT consult today   LOS: 4 days   Sriram Febles JOSEPH 03/22/2012, 6:35 AM

## 2012-03-22 NOTE — Progress Notes (Signed)
Patient out of bed walking in hallway.  Assisted back to bed with much difficulty because she thinks she needs to return to her own bed at home with Susie her daughter.  Attempted to reorient to her surroundings, explaining why it is important to stay in chair, in bed and call for assistance when she would like to ambulate, stressing safety from falling.  Her daughter has spoken to her multiple times on the phone,calling her now to advise her of mothers confusion.

## 2012-03-23 LAB — BASIC METABOLIC PANEL
BUN: 15 mg/dL (ref 6–23)
CO2: 31 mEq/L (ref 19–32)
Calcium: 9.9 mg/dL (ref 8.4–10.5)
Glucose, Bld: 129 mg/dL — ABNORMAL HIGH (ref 70–99)
Sodium: 136 mEq/L (ref 135–145)

## 2012-03-23 LAB — CBC
HCT: 33 % — ABNORMAL LOW (ref 36.0–46.0)
Hemoglobin: 11 g/dL — ABNORMAL LOW (ref 12.0–15.0)
MCH: 28.7 pg (ref 26.0–34.0)
MCHC: 33.3 g/dL (ref 30.0–36.0)
MCV: 86.2 fL (ref 78.0–100.0)
RBC: 3.83 MIL/uL — ABNORMAL LOW (ref 3.87–5.11)

## 2012-03-23 MED ORDER — DILTIAZEM HCL ER COATED BEADS 180 MG PO CP24: 180.0000 mg | ORAL_CAPSULE | Freq: Every day | ORAL | Status: DC

## 2012-03-23 MED ORDER — METOPROLOL TARTRATE 25 MG PO TABS: 50.0000 mg | ORAL_TABLET | Freq: Two times a day (BID) | ORAL | Status: DC

## 2012-03-23 MED ORDER — ATORVASTATIN CALCIUM 20 MG PO TABS: 20.0000 mg | ORAL_TABLET | Freq: Every day | ORAL | Status: DC

## 2012-03-23 MED ORDER — BENZONATATE 100 MG PO CAPS: 100.0000 mg | ORAL_CAPSULE | Freq: Three times a day (TID) | ORAL | Status: AC | PRN

## 2012-03-23 MED ORDER — POTASSIUM CHLORIDE CRYS ER 20 MEQ PO TBCR: 20.0000 meq | EXTENDED_RELEASE_TABLET | Freq: Every day | ORAL | Status: DC

## 2012-03-23 MED ORDER — FUROSEMIDE 20 MG PO TABS: 40.0000 mg | ORAL_TABLET | Freq: Every day | ORAL | Status: DC

## 2012-03-23 MED ORDER — ASPIRIN EC 325 MG PO TBEC: 325.0000 mg | DELAYED_RELEASE_TABLET | Freq: Every day | ORAL | Status: AC

## 2012-03-23 MED ORDER — MOXIFLOXACIN HCL 400 MG PO TABS: 400.0000 mg | ORAL_TABLET | Freq: Every day | ORAL | Status: AC

## 2012-03-23 NOTE — Progress Notes (Signed)
Discharge instructions given to pt/dtr. Verbalized understanding. Left the unit in stable condition. 

## 2012-03-23 NOTE — Discharge Summary (Signed)
Physician Discharge Summary  Patient ID: Carrie Best MRN: [id number] DOB/AGE: 03/29/1928 78 y.o.  Admit date: 03/18/2012 Discharge date: 03/23/2012  Admission Diagnoses: Healthcare associated pneumonia Congestive heart failure, diastolic Her diarrhea Obesity Hypertension Gastroesophageal reflux disease Hyperlipidemia Atrial fibrillation  Discharge Diagnoses:  Principal Problem:  *Healthcare-associated pneumonia Active Problems:  Acute on chronic diastolic congestive heart failure  Obesity  Diarrhea  HTN (hypertension)  GERD (gastroesophageal reflux disease)  Hyperlipidemia  Sleep disturbance  Atrial fibrillation   Discharged Condition: good  Hospital Course: The patient presented on July 13 with frequent watery diarrhea. There is no fever. 2 days prior to admission she was diagnosed with pneumonia and started on Levaquin. Her cough has continued to persist as did her diarrhea started daughter took her to the emergency room. Her initial chest x-ray showed right upper lobe and left perihilar infiltrates concerning for multilobar pneumonia. Her lab work was notable for a potassium of 2.4, normal renal functionand a white blood cell count 15.7. Her EKG showed atrial fibrillation which was new in onset. Patient started on vancomycin, cefepime and Levaquin for healthcare associated pneumonia. A followup chest x-ray showed improvement. She initially required oxygen but by discharge was saturating in the 90s without oxygen. Her cough improved. Her appetite also improved. Her atrial fibrillation and rapid response and the Toprol was continued and her felodipine was changed to diltiazem. She was treated with enoxaparin and will switch to aspirin at discharge, with consideration for anticoagulation given that her one-week followup. The patient did have an echocardiogram showing normal systolic function, mild mitral regurgitation, mild left atrial dilation and right atrial dilation. She had no  significant diarrhea during hospitalization and C. difficile PCR negative. Hypokalemia was corrected.  Consults: None  Significant Diagnostic Studies: labs: As above and radiology: CXR: infiltrates: upper and middle lobe bilaterally  Treatments: antibiotics: vancomycin, Levaquin and cefepime, cardiac meds: metoprolol, diltiazem and furosemide and anticoagulation: LMW heparin  Discharge Exam: Blood pressure 127/78, pulse 75, temperature 98.2 F (36.8 C), temperature source Oral, resp. rate 18, height 5\' 8"  (1.727 m), weight 88.2 kg (194 lb 7.1 oz), SpO2 99.00%. Resp: clear to auscultation bilaterally Cardio: irregularly irregular rhythm  Disposition: 01-Home or Self Care   Medication List  As of 03/23/2012  7:08 AM   STOP taking these medications         aspirin 81 MG tablet      felodipine 5 MG 24 hr tablet      levofloxacin 750 MG tablet      lisinopril 5 MG tablet      pantoprazole 40 MG tablet      simvastatin 40 MG tablet         TAKE these medications         aspirin EC 325 MG tablet   Take 1 tablet (325 mg total) by mouth daily.      atorvastatin 20 MG tablet   Commonly known as: LIPITOR   Take 1 tablet (20 mg total) by mouth at bedtime.      benzonatate 100 MG capsule   Commonly known as: TESSALON   Take 1 capsule (100 mg total) by mouth 3 (three) times daily as needed for cough.      diltiazem 180 MG 24 hr capsule   Commonly known as: CARDIZEM CD   Take 1 capsule (180 mg total) by mouth daily.      docusate sodium 100 MG capsule   Commonly known as: COLACE   Take 100 mg by  mouth 2 (two) times daily.      DULoxetine 60 MG capsule   Commonly known as: CYMBALTA   Take 60 mg by mouth daily.      fish oil-omega-3 fatty acids 1000 MG capsule   Take 1 g by mouth daily.      furosemide 20 MG tablet   Commonly known as: LASIX   Take 2 tablets (40 mg total) by mouth daily.      metoprolol tartrate 25 MG tablet   Commonly known as: LOPRESSOR   Take 2  tablets (50 mg total) by mouth 2 (two) times daily.      moxifloxacin 400 MG tablet   Commonly known as: AVELOX   Take 1 tablet (400 mg total) by mouth daily before breakfast.      oxyCODONE-acetaminophen 5-325 MG per tablet   Commonly known as: PERCOCET/ROXICET   Take 1 tablet by mouth every 4 (four) hours as needed. pain      potassium chloride SA 20 MEQ tablet   Commonly known as: K-DUR,KLOR-CON   Take 1 tablet (20 mEq total) by mouth daily.      ranitidine 150 MG capsule   Commonly known as: ZANTAC   Take 150 mg by mouth daily as needed. Reflux      senna 8.6 MG Tabs   Commonly known as: SENOKOT   Take 1 tablet by mouth daily.      zolpidem 10 MG tablet   Commonly known as: AMBIEN   Take 10 mg by mouth at bedtime as needed.           Follow-up Information    Follow up with Lillia Mountain, MD in 7 days.   Contact information:   [address], [address] [place], [place]. [place] [address] [phone]          Signed: Lillia Mountain 03/23/2012, 7:08 AM

## 2012-03-24 LAB — CULTURE, BLOOD (ROUTINE X 2): Culture: NO GROWTH

## 2012-04-11 ENCOUNTER — Other Ambulatory Visit: Payer: Self-pay | Admitting: Urology

## 2012-05-10 ENCOUNTER — Encounter (HOSPITAL_COMMUNITY)
Admission: RE | Admit: 2012-05-10 | Discharge: 2012-05-10 | Disposition: A | Discharge status: Home / Self Care | Payer: Medicare Other | Source: Ambulatory Visit | Attending: Urology | Admitting: Urology

## 2012-05-10 ENCOUNTER — Encounter (HOSPITAL_COMMUNITY): Payer: Self-pay | Admitting: Pharmacy Technician

## 2012-05-10 ENCOUNTER — Ambulatory Visit (HOSPITAL_COMMUNITY)
Admission: RE | Admit: 2012-05-10 | Discharge: 2012-05-10 | Disposition: A | Discharge status: Home / Self Care | Payer: Medicare Other | Source: Ambulatory Visit | Attending: Urology | Admitting: Urology

## 2012-05-10 ENCOUNTER — Encounter (HOSPITAL_COMMUNITY): Payer: Self-pay

## 2012-05-10 DIAGNOSIS — I499 Cardiac arrhythmia, unspecified: Secondary | ICD-10-CM

## 2012-05-10 DIAGNOSIS — J189 Pneumonia, unspecified organism: Secondary | ICD-10-CM

## 2012-05-10 DIAGNOSIS — Z01812 Encounter for preprocedural laboratory examination: Secondary | ICD-10-CM | POA: Insufficient documentation

## 2012-05-10 DIAGNOSIS — K219 Gastro-esophageal reflux disease without esophagitis: Secondary | ICD-10-CM

## 2012-05-10 DIAGNOSIS — H9190 Unspecified hearing loss, unspecified ear: Secondary | ICD-10-CM

## 2012-05-10 DIAGNOSIS — Y831 Surgical operation with implant of artificial internal device as the cause of abnormal reaction of the patient, or of later complication, without mention of misadventure at the time of the procedure: Secondary | ICD-10-CM | POA: Insufficient documentation

## 2012-05-10 DIAGNOSIS — T85695A Other mechanical complication of other nervous system device, implant or graft, initial encounter: Secondary | ICD-10-CM | POA: Insufficient documentation

## 2012-05-10 DIAGNOSIS — I639 Cerebral infarction, unspecified: Secondary | ICD-10-CM

## 2012-05-10 HISTORY — DX: Cardiac arrhythmia, unspecified: I49.9

## 2012-05-10 HISTORY — PX: INTERSTIM IMPLANT PLACEMENT: SHX5130

## 2012-05-10 HISTORY — DX: Pneumonia, unspecified organism: J18.9

## 2012-05-10 HISTORY — DX: Gastro-esophageal reflux disease without esophagitis: K21.9

## 2012-05-10 HISTORY — PX: CATARACT EXTRACTION, BILATERAL: SHX1313

## 2012-05-10 HISTORY — DX: Cardiac murmur, unspecified: R01.1

## 2012-05-10 HISTORY — DX: Cerebral infarction, unspecified: I63.9

## 2012-05-10 HISTORY — DX: Unspecified hearing loss, unspecified ear: H91.90

## 2012-05-10 LAB — CBC
MCH: 29.4 pg (ref 26.0–34.0)
MCHC: 34.7 g/dL (ref 30.0–36.0)
MCV: 84.6 fL (ref 78.0–100.0)
Platelets: 258 10*3/uL (ref 150–400)
RBC: 4.36 MIL/uL (ref 3.87–5.11)

## 2012-05-10 LAB — BASIC METABOLIC PANEL
CO2: 27 mEq/L (ref 19–32)
Calcium: 9.7 mg/dL (ref 8.4–10.5)
Creatinine, Ser: 0.93 mg/dL (ref 0.50–1.10)
GFR calc non Af Amer: 56 mL/min — ABNORMAL LOW (ref >90)
Glucose, Bld: 155 mg/dL — ABNORMAL HIGH (ref 70–99)

## 2012-05-10 LAB — SURGICAL PCR SCREEN: MRSA, PCR: NEGATIVE

## 2012-05-10 NOTE — Pre-Procedure Instructions (Signed)
05-10-12 EKG 03-20-12-Epic, CXR 03-16-12-abnormal-Epic,repeat today per Dr. Leta Jungling review. W. Kennon Portela

## 2012-05-10 NOTE — Patient Instructions (Addendum)
20 Carrie Best  05/10/2012   Your procedure is scheduled on:  9-10 -2013  Report to [facility] at     0800   AM..  Call this number if you have problems the morning of surgery: [phone] or Presurgical Testing [phone]  Remember:   Do not eat food:After Midnight.    Take these medicines the morning of surgery with A SIP OF WATER: Diltazem, Cymbalta, Metoprolol, . DoNot  Take Rivaroxaban and stop Fish oil today.   Do not wear jewelry, make-up or nail polish.  Do not wear lotions, powders, or perfumes. You may wear deodorant.  Do not shave 48 hours prior to surgery.(face and neck okay, no shaving of legs)  Do not bring valuables to the hospital.  Contacts, dentures or bridgework may not be worn into surgery.  Leave suitcase in the car. After surgery it may be brought to your room.  For patients admitted to the hospital, checkout time is 11:00 AM the day of discharge.   Patients discharged the day of surgery will not be allowed to drive home.  Name and phone number of your driver: Carrie Best, daughter- [phone]  Special Instructions: CHG Shower Use Special Wash: 1/2 bottle night before surgery and 1/2 bottle morning of surgery.(avoid face and genitals)   Please read over the instruction sheets that you were given: MRSA Information.

## 2012-05-15 NOTE — H&P (Signed)
History of Present Illness   Carrie Best has a malfunctioning Interstim and ongoing urge incontinence. She still wears 3 heavy pads a day. She denies enuresis. She still has stress incontinence. The symptoms have worsened over time. She had ____troubleshooting the device in 2011.   There is no other modifying factors or associated signs or symptoms. There is no other aggravating or relieving factors. The symptoms are moderate in severity and persisting.   Carrie Best appears to have had a stroke. She was hospitalized and may have pneumonia in both lungs. There is question that she could have had a myocardial infarction.  I examined the Interstim device and incisions look fine.   Her and her daughter would like it removed because they cannot do an MRI with the device in place.    Past Medical History Problems  1. History of  Anxiety (Symptom) 300.00 2. History of  Anxiety (Symptom) 300.00 3. History of  Arthritis V13.4 4. History of  Arthritis V13.4 5. History of  Bladder Incontinence 6. History of  Bladder Incontinence 7. History of  Heartburn 787.1 8. History of  Heartburn 787.1 9. History of  Hypercholesterolemia 272.0 10. History of  Hypertension 401.9 11. History of  Hypertension 401.9 12. History of  Infection Of A Kidney 590.9 13. History of  Nocturia 788.43 14. History of  Urinary Tract Infection V13.02  Surgical History Problems  1. History of  Appendectomy 2. History of  Install Sacral Nerve Neurostimulator By Incision 3. History of  Obesity Surg Gastric Neurostimulator Replace Pulse Generator  Current Meds 1. A Thru Z Select Oral Tablet; Therapy: (Recorded:03Jul2008) to 2. Ciprofloxacin HCl 250 MG Oral Tablet; 250 mg bid for 3 days; Therapy: 01Sep2010 to (Last  Rx:01Sep2010) 3. Cymbalta 60 MG Oral Capsule Delayed Release Particles; Therapy: (Recorded:03Jul2008) to 4. Detrol LA 4 MG Oral Capsule Extended Release 24 Hour; TAKE 1 CAPSULE BY MOUTH DAILY;  Therapy:  29Apr2008 to (Last Rx:29Apr2008) 5. Detrol LA 4 MG Oral Capsule Extended Release 24 Hour; Therapy: (Recorded:03Jul2008) to 6. Felodipine ER 5 MG Oral Tablet Extended Release 24 Hour; Therapy: (Recorded:03Jul2008) to 7. Hydrochlorothiazide 25 MG Oral Tablet; Therapy: (Recorded:03Jul2008) to 8. Hyoscyamine Sulfate ER 0.375 MG Oral Tablet Extended Release 12 Hour; .375 mg bid for 30  days; Therapy: 12Nov2008 to (Evaluate:07Nov2009); Last Rx:12Nov2008 9. Lipitor 10 MG Oral Tablet; Therapy: (Recorded:03Jul2008) to 10. Meclizine HCl 25 MG Oral Tablet; Therapy: (Recorded:03Jul2008) to 11. Metoprolol Tartrate 100 MG Oral Tablet; Therapy: (Recorded:03Jul2008) to  Allergies Medication  1. Codeine Derivatives 2. Penicillins 3. Sulfa Drugs  Family History Problems  1. Fraternal history of  Family Health Status Number Of Children 1 son and 1 daughter 2. Fraternal history of  Lung Cancer V16.1  Social History Problems  1. Fraternal history of  Death In The Family Father Age 94 accident 2. Fraternal history of  Death In The Family Mother Age 28 old age 24. Marital History - Currently Married 4. Occupation: retired  Assessment Assessed  1. Urge And Stress Incontinence 788.33  Plan Urge And Stress Incontinence (788.33)  1. Follow-up Schedule Surgery Office  Follow-up  Done: 30Jul2013  Discussion/Summary   I went over explantation of the device with Carrie Best and her daughter. I went over general risk including risk of anesthesia, infection and pain, and most importantly a retained hip. This likely would exclude future MRI's as well. My concern is that she just got out of the hospital and that I do not think anesthesia would want to put  her asleep at this point in time.   Carrie Best and her daughter understand that we may have to wait for several weeks before it is safe to proceed with her surgery but will seek medical clearance first with Dr. Kirby Funk and also with anesthesia and  possibly cardiology. I think she has had so many doctors, I first would like to start with Dr. Kirby Funk. I am going to send a copy of my note to Dr. Valentina Lucks to keep him updated on her treatment course.  After a thorough review of the management options for the patient's condition the patient  elected to proceed with surgical therapy as noted above. We have discussed the potential benefits and risks of the procedure, side effects of the proposed treatment, the likelihood of the patient achieving the goals of the procedure, and any potential problems that might occur during the procedure or recuperation. Informed consent has been obtained.

## 2012-05-16 ENCOUNTER — Ambulatory Visit (HOSPITAL_COMMUNITY): Payer: Medicare Other

## 2012-05-16 ENCOUNTER — Encounter (HOSPITAL_COMMUNITY): Payer: Self-pay | Admitting: *Deleted

## 2012-05-16 ENCOUNTER — Ambulatory Visit (HOSPITAL_COMMUNITY)
Admission: RE | Admit: 2012-05-16 | Discharge: 2012-05-16 | Disposition: A | Discharge status: Home / Self Care | Payer: Medicare Other | Source: Ambulatory Visit | Attending: Urology | Admitting: Urology

## 2012-05-16 ENCOUNTER — Ambulatory Visit (HOSPITAL_COMMUNITY): Payer: Medicare Other | Admitting: *Deleted

## 2012-05-16 ENCOUNTER — Encounter (HOSPITAL_COMMUNITY)
Admission: RE | Disposition: A | Discharge status: Home / Self Care | Payer: Self-pay | Source: Ambulatory Visit | Attending: Urology

## 2012-05-16 DIAGNOSIS — E7800 Pure hypercholesterolemia, unspecified: Secondary | ICD-10-CM | POA: Insufficient documentation

## 2012-05-16 DIAGNOSIS — I10 Essential (primary) hypertension: Secondary | ICD-10-CM | POA: Insufficient documentation

## 2012-05-16 DIAGNOSIS — N3946 Mixed incontinence: Secondary | ICD-10-CM | POA: Insufficient documentation

## 2012-05-16 DIAGNOSIS — T8389XA Other specified complication of genitourinary prosthetic devices, implants and grafts, initial encounter: Secondary | ICD-10-CM | POA: Insufficient documentation

## 2012-05-16 DIAGNOSIS — Z79899 Other long term (current) drug therapy: Secondary | ICD-10-CM | POA: Insufficient documentation

## 2012-05-16 DIAGNOSIS — Y831 Surgical operation with implant of artificial internal device as the cause of abnormal reaction of the patient, or of later complication, without mention of misadventure at the time of the procedure: Secondary | ICD-10-CM | POA: Insufficient documentation

## 2012-05-16 HISTORY — PX: INTERSTIM IMPLANT REMOVAL: SHX5131

## 2012-05-16 LAB — PROTIME-INR: INR: 1.06 (ref 0.00–1.49)

## 2012-05-16 SURGERY — REMOVAL, NEUROSTIMULATOR, SACRAL
Anesthesia: General | Site: Back | Wound class: Clean

## 2012-05-16 MED ORDER — PROPOFOL 10 MG/ML IV BOLUS
INTRAVENOUS | Status: DC | PRN
  Administered 2012-05-16: 150 mg via INTRAVENOUS

## 2012-05-16 MED ORDER — GLYCOPYRROLATE 0.2 MG/ML IJ SOLN
INTRAMUSCULAR | Status: DC | PRN
  Administered 2012-05-16: .05 mg via INTRAVENOUS
  Administered 2012-05-16: 0.6 mg via INTRAVENOUS
  Administered 2012-05-16 (×2): .05 mg via INTRAVENOUS

## 2012-05-16 MED ORDER — ACETAMINOPHEN 10 MG/ML IV SOLN
INTRAVENOUS | Status: AC
  Filled 2012-05-16: qty 100

## 2012-05-16 MED ORDER — METOCLOPRAMIDE HCL 5 MG/ML IJ SOLN
INTRAMUSCULAR | Status: DC | PRN
  Administered 2012-05-16: 10 mg via INTRAVENOUS

## 2012-05-16 MED ORDER — VANCOMYCIN HCL IN DEXTROSE 1-5 GM/200ML-% IV SOLN
INTRAVENOUS | Status: AC
  Filled 2012-05-16: qty 200

## 2012-05-16 MED ORDER — OXYCODONE-ACETAMINOPHEN 10-325 MG PO TABS: 1.0000 | ORAL_TABLET | Freq: Four times a day (QID) | ORAL | Status: AC | PRN

## 2012-05-16 MED ORDER — ROCURONIUM BROMIDE 100 MG/10ML IV SOLN
INTRAVENOUS | Status: DC | PRN
  Administered 2012-05-16: 30 mg via INTRAVENOUS

## 2012-05-16 MED ORDER — ACETAMINOPHEN 10 MG/ML IV SOLN
INTRAVENOUS | Status: DC | PRN
  Administered 2012-05-16: 1000 mg via INTRAVENOUS

## 2012-05-16 MED ORDER — FENTANYL CITRATE 0.05 MG/ML IJ SOLN: 25.0000 ug | INTRAMUSCULAR | Status: DC | PRN

## 2012-05-16 MED ORDER — ONDANSETRON HCL 4 MG/2ML IJ SOLN
INTRAMUSCULAR | Status: DC | PRN
  Administered 2012-05-16: 4 mg via INTRAVENOUS

## 2012-05-16 MED ORDER — DEXAMETHASONE SODIUM PHOSPHATE 4 MG/ML IJ SOLN
INTRAMUSCULAR | Status: DC | PRN
  Administered 2012-05-16: 10 mg via INTRAVENOUS

## 2012-05-16 MED ORDER — VANCOMYCIN HCL IN DEXTROSE 1-5 GM/200ML-% IV SOLN
1000.0000 mg | INTRAVENOUS | Status: AC
  Administered 2012-05-16: 1000 mg via INTRAVENOUS

## 2012-05-16 MED ORDER — KETAMINE HCL 10 MG/ML IJ SOLN
INTRAMUSCULAR | Status: DC | PRN
  Administered 2012-05-16 (×2): 2 mg via INTRAVENOUS
  Administered 2012-05-16: 25 mg via INTRAVENOUS
  Administered 2012-05-16 (×3): 2 mg via INTRAVENOUS

## 2012-05-16 MED ORDER — NEOSTIGMINE METHYLSULFATE 1 MG/ML IJ SOLN
INTRAMUSCULAR | Status: DC | PRN
  Administered 2012-05-16: 5 mg via INTRAVENOUS

## 2012-05-16 MED ORDER — SODIUM CHLORIDE 0.9 % IR SOLN
Status: DC | PRN
  Administered 2012-05-16: 10:00:00

## 2012-05-16 MED ORDER — LACTATED RINGERS IV SOLN
INTRAVENOUS | Status: DC | PRN
  Administered 2012-05-16: 09:00:00 via INTRAVENOUS

## 2012-05-16 MED ORDER — LACTATED RINGERS IV SOLN
INTRAVENOUS | Status: DC
  Administered 2012-05-16: 11:00:00 via INTRAVENOUS

## 2012-05-16 MED ORDER — FENTANYL CITRATE 0.05 MG/ML IJ SOLN
INTRAMUSCULAR | Status: DC | PRN
  Administered 2012-05-16: 50 ug via INTRAVENOUS

## 2012-05-16 SURGICAL SUPPLY — 32 items
ADH SKN CLS APL DERMABOND .7 (GAUZE/BANDAGES/DRESSINGS) ×3
BLADE HEX COATED 2.75 (ELECTRODE) ×2 IMPLANT
BLADE SURG SZ10 CARB STEEL (BLADE) ×2 IMPLANT
CANISTER SUCTION 2500CC (MISCELLANEOUS) ×2 IMPLANT
CLOTH BEACON ORANGE TIMEOUT ST (SAFETY) ×2 IMPLANT
COVER SURGICAL LIGHT HANDLE (MISCELLANEOUS) ×2 IMPLANT
DECANTER SPIKE VIAL GLASS SM (MISCELLANEOUS) IMPLANT
DERMABOND ADVANCED (GAUZE/BANDAGES/DRESSINGS) ×3
DERMABOND ADVANCED .7 DNX12 (GAUZE/BANDAGES/DRESSINGS) IMPLANT
DRAPE INCISE 23X17 IOBAN STRL (DRAPES) ×1
DRAPE INCISE 23X17 STRL (DRAPES) ×1 IMPLANT
DRAPE INCISE IOBAN 23X17 STRL (DRAPES) ×1 IMPLANT
DRAPE LAPAROSCOPIC ABDOMINAL (DRAPES) ×2 IMPLANT
GAUZE SPONGE 4X4 16PLY XRAY LF (GAUZE/BANDAGES/DRESSINGS) ×2 IMPLANT
GLOVE BIOGEL M STRL SZ7.5 (GLOVE) ×2 IMPLANT
GOWN STRL REIN XL XLG (GOWN DISPOSABLE) ×2 IMPLANT
KIT BASIN OR (CUSTOM PROCEDURE TRAY) ×2 IMPLANT
NDL HYPO 25X1 1.5 SAFETY (NEEDLE) IMPLANT
NEEDLE HYPO 25X1 1.5 SAFETY (NEEDLE) IMPLANT
NS IRRIG 1000ML POUR BTL (IV SOLUTION) IMPLANT
PACK GENERAL/GYN (CUSTOM PROCEDURE TRAY) ×2 IMPLANT
SPONGE GAUZE 4X4 12PLY (GAUZE/BANDAGES/DRESSINGS) ×2 IMPLANT
STAPLER VISISTAT 35W (STAPLE) IMPLANT
SUT ETHILON 3 0 PS 1 (SUTURE) IMPLANT
SUT VIC AB 2-0 UR6 27 (SUTURE) ×2 IMPLANT
SUT VIC AB 3-0 SH 27 (SUTURE) ×2
SUT VIC AB 3-0 SH 27XBRD (SUTURE) IMPLANT
SUT VIC AB 4-0 PS2 27 (SUTURE) ×5 IMPLANT
SYR CONTROL 10ML LL (SYRINGE) IMPLANT
TOWEL OR 17X26 10 PK STRL BLUE (TOWEL DISPOSABLE) ×2 IMPLANT
TRAY FOLEY CATH 14FRSI W/METER (CATHETERS) IMPLANT
WATER STERILE IRR 1500ML POUR (IV SOLUTION) IMPLANT

## 2012-05-16 NOTE — Interval H&P Note (Signed)
History and Physical Interval Note:  05/16/2012 7:15 AM  [name]  has presented today for surgery, with the diagnosis of malfunctioning interstim  The various methods of treatment have been discussed with the patient and family. After consideration of risks, benefits and other options for treatment, the patient has consented to  Procedure(s) (LRB) with comments: REMOVAL OF INTERSTIM IMPLANT (N/A) as a surgical intervention .  The patient's history has been reviewed, patient examined, no change in status, stable for surgery.  I have reviewed the patient's chart and labs.  Questions were answered to the patient's satisfaction.     Dacia Capers A

## 2012-05-16 NOTE — Transfer of Care (Signed)
Immediate Anesthesia Transfer of Care Note  Patient: Carrie Best  Procedure(s) Performed: Procedure(s) (LRB) with comments: REMOVAL OF INTERSTIM IMPLANT (N/A)  Patient Location: PACU  Anesthesia Type: General  Level of Consciousness: awake, alert  and patient cooperative  Airway & Oxygen Therapy: Patient Spontanous Breathing and Patient connected to face mask oxygen  Post-op Assessment: Report given to PACU RN, Post -op Vital signs reviewed and stable and Patient moving all extremities  Post vital signs: Reviewed and stable  Complications: No apparent anesthesia complications

## 2012-05-16 NOTE — Progress Notes (Signed)
Pt ambulated in hall to bathroom.  Pt unable to void.  Pt drinking fluids as well as LR's infusing.

## 2012-05-16 NOTE — Preoperative (Signed)
Beta Blockers   Reason not to administer Beta Blockers:Not Applicable, took BB this am 

## 2012-05-16 NOTE — Anesthesia Postprocedure Evaluation (Signed)
  Anesthesia Post-op Note  Patient: Carrie Best  Procedure(s) Performed: Procedure(s) (LRB): REMOVAL OF INTERSTIM IMPLANT (N/A)  Patient Location: PACU  Anesthesia Type: General  Level of Consciousness: awake and alert   Airway and Oxygen Therapy: Patient Spontanous Breathing  Post-op Pain: mild  Post-op Assessment: Post-op Vital signs reviewed, Patient's Cardiovascular Status Stable, Respiratory Function Stable, Patent Airway and No signs of Nausea or vomiting  Post-op Vital Signs: stable  Complications: No apparent anesthesia complications

## 2012-05-16 NOTE — Op Note (Signed)
Preoperative diagnosis: Malfunctioning InterStim Postoperative diagnosis: Malfunctioning InterStim Surgery: Removal of InterStim and fluoroscopy Surgeon: Dr. Lorin Picket Mega Kinkade  The patient has the above diagnoses and consented to the above procedure. She was placed under general anesthesia in the prone position with the appropriate skin preparation and preoperative antibiotics given  I could easily see the right high buttock incision and her small just to the right of the midline incision above the buttock.  I opened the right buttock incision and carried the dissection through subcutaneous tissue opening is pseudocapsule and easily delivering the InterStim device. The generator was removed after using a screwdriver.  Fluoroscopy and tiny on the lead identified the bend of the lead underneath the midline incision. The midline incision ended up being open 2-1/2 cm so I could dissect sharply and bluntly down almost to the bone table. It was easily located. I delivered the lead from lateral to medial. I sharply and laterally mobilized down to the table using fluoroscopy to make certain I was low enough. I placed a hemostat on the lead and pulled directly up in the same axis as the lead easily removing it in its entirety. Fluoroscopy again identified no residual fragment  All incisions were irrigated. Pseudocapsule was opened laterally. 3-0 Vicryl was used for subcutaneous tissue. 4-0 Vicryl was used for the subcuticular closure. Dermabond dressing was applied  I was pleased with the procedure

## 2012-05-16 NOTE — Anesthesia Procedure Notes (Signed)
Procedure Name: Intubation Date/Time: 05/16/2012 9:55 AM Performed by: Randon Goldsmith CATHERINE PAYNE Pre-anesthesia Checklist: Emergency Drugs available, Patient identified, Suction available and Patient being monitored Patient Re-evaluated:Patient Re-evaluated prior to inductionOxygen Delivery Method: Circle system utilized Preoxygenation: Pre-oxygenation with 100% oxygen Intubation Type: IV induction Ventilation: Mask ventilation without difficulty Grade View: Grade II Tube type: Oral Tube size: 7.5 mm Number of attempts: 1 Airway Equipment and Method: Bougie stylet Placement Confirmation: positive ETCO2 and breath sounds checked- equal and bilateral Secured at: 21 cm Tube secured with: Tape Dental Injury: Teeth and Oropharynx as per pre-operative assessment  Difficulty Due To: Difficult Airway- due to anterior larynx

## 2012-05-16 NOTE — Anesthesia Preprocedure Evaluation (Addendum)
Anesthesia Evaluation  Patient identified by MRN, date of birth, ID band Patient awake    Reviewed: Allergy & Precautions, H&P , NPO status , Patient's Chart, lab work & pertinent test results, reviewed documented beta blocker date and time   Airway Mallampati: II TM Distance: >3 FB Neck ROM: full    Dental No notable dental hx. (+) Teeth Intact and Dental Advisory Given   Pulmonary neg pulmonary ROS, shortness of breath and with exertion, pneumonia -, resolved,  Recent pneumonia 2 to 3 weeks ago breath sounds clear to auscultation  Pulmonary exam normal       Cardiovascular Exercise Tolerance: Good hypertension, Pt. on home beta blockers +CHF negative cardio ROS  + dysrhythmias Atrial Fibrillation Rhythm:regular Rate:Normal  Diastolic dysfunction   Neuro/Psych CVA, No Residual Symptoms negative neurological ROS  negative psych ROS   GI/Hepatic negative GI ROS, Neg liver ROS, GERD-  Medicated and Controlled,  Endo/Other  negative endocrine ROS  Renal/GU negative Renal ROS  negative genitourinary   Musculoskeletal   Abdominal   Peds  Hematology negative hematology ROS (+)   Anesthesia Other Findings   Reproductive/Obstetrics negative OB ROS                          Anesthesia Physical Anesthesia Plan  ASA: III  Anesthesia Plan: General   Post-op Pain Management:    Induction: Intravenous  Airway Management Planned: Oral ETT  Additional Equipment:   Intra-op Plan:   Post-operative Plan: Extubation in OR  Informed Consent: I have reviewed the patients History and Physical, chart, labs and discussed the procedure including the risks, benefits and alternatives for the proposed anesthesia with the patient or authorized representative who has indicated his/her understanding and acceptance.   Dental Advisory Given  Plan Discussed with: CRNA and Surgeon  Anesthesia Plan Comments:          Anesthesia Quick Evaluation

## 2012-05-17 ENCOUNTER — Encounter (HOSPITAL_COMMUNITY): Payer: Self-pay | Admitting: Urology

## 2012-06-05 ENCOUNTER — Other Ambulatory Visit: Payer: Self-pay | Admitting: Internal Medicine

## 2012-06-05 DIAGNOSIS — R51 Headache: Secondary | ICD-10-CM

## 2012-06-09 ENCOUNTER — Ambulatory Visit
Admission: RE | Admit: 2012-06-09 | Discharge: 2012-06-09 | Disposition: A | Discharge status: Home / Self Care | Payer: Medicare Other | Source: Ambulatory Visit | Attending: Internal Medicine | Admitting: Internal Medicine

## 2012-06-09 DIAGNOSIS — R51 Headache: Secondary | ICD-10-CM

## 2012-07-18 IMAGING — CR DG CHEST 2V
2 series · 2 of 2 positions shown · non-contrast
Comparison: 03/18/2012

CLINICAL DATA: Pneumonia.  Congestive heart failure.

CHEST - 2 VIEW

[w chest lat]
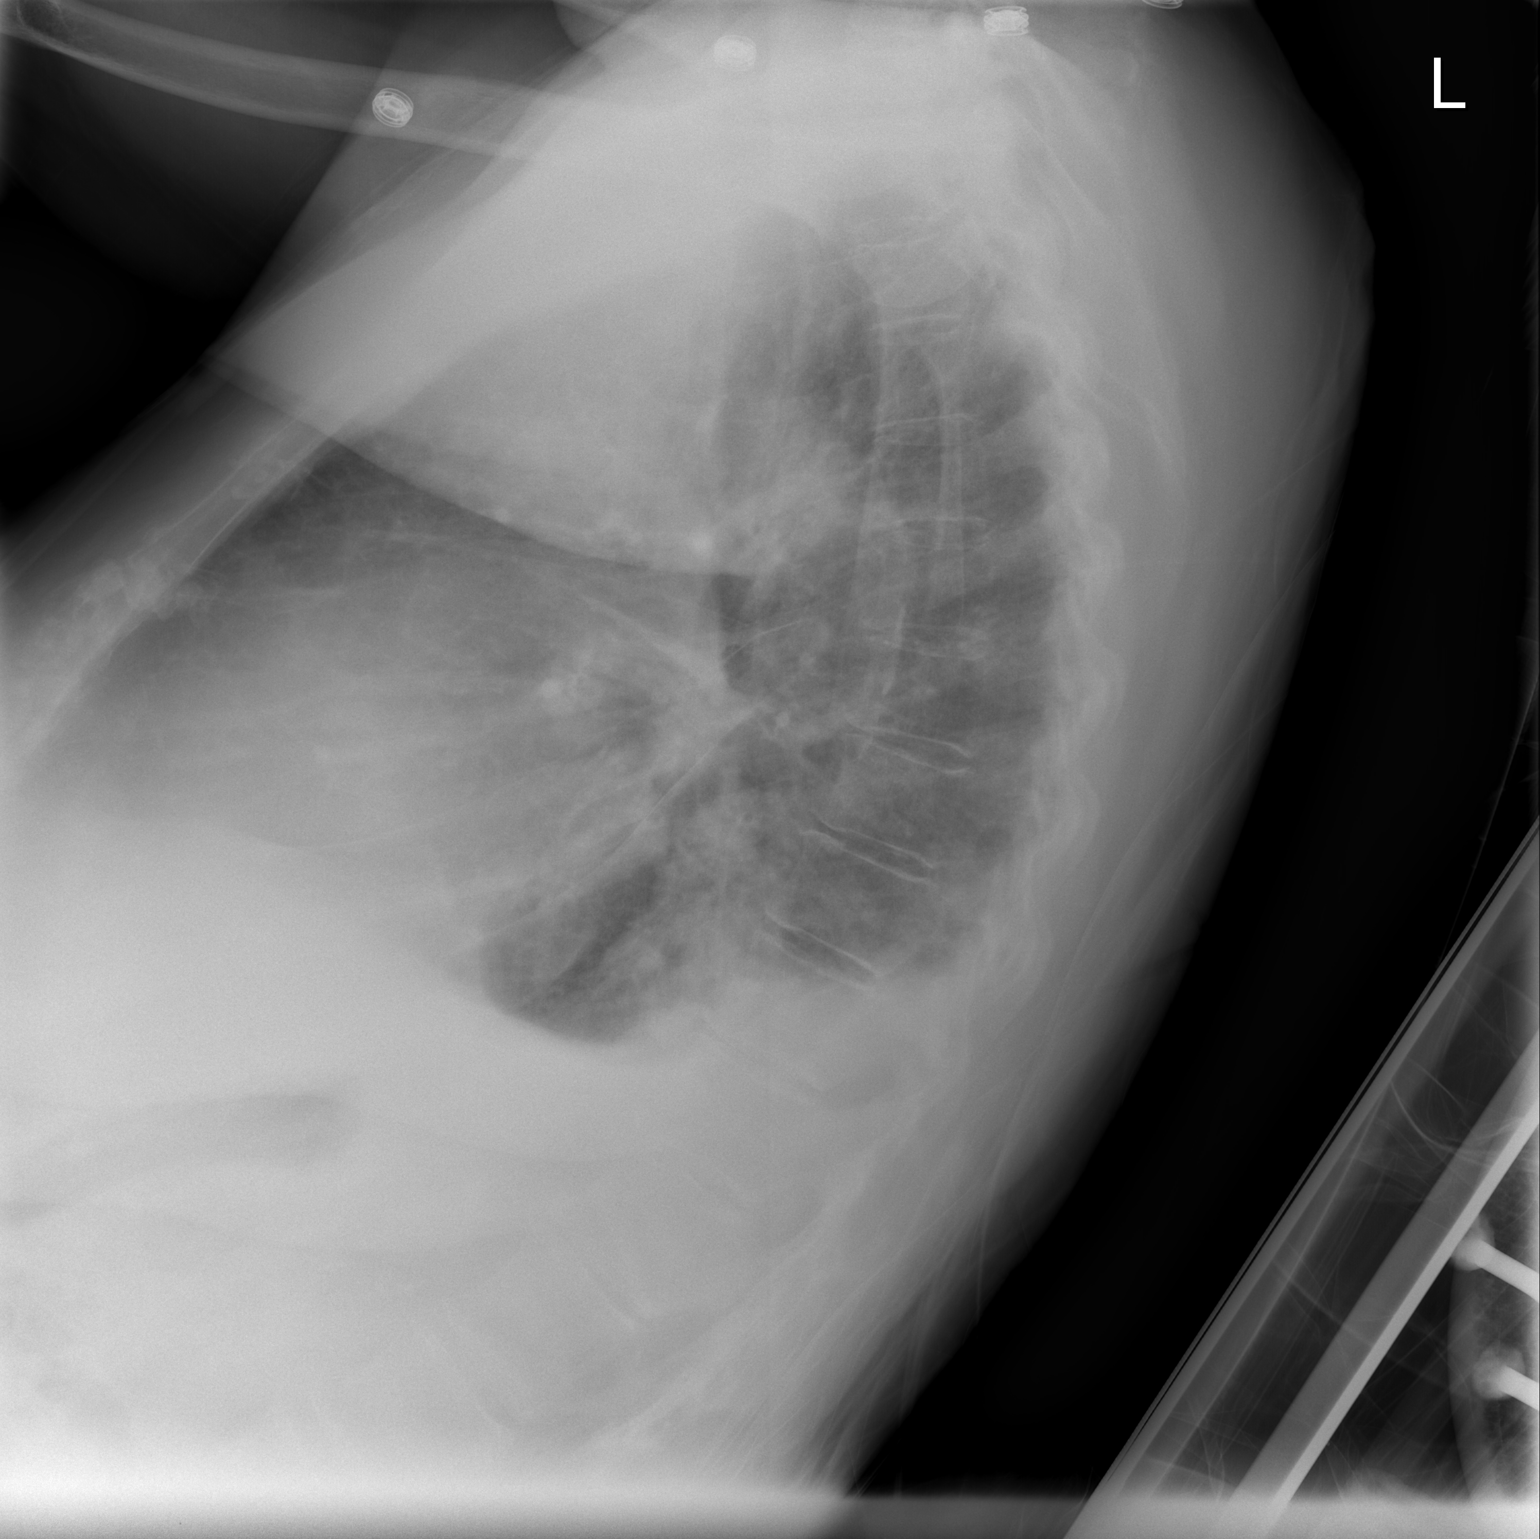

[view not recorded]
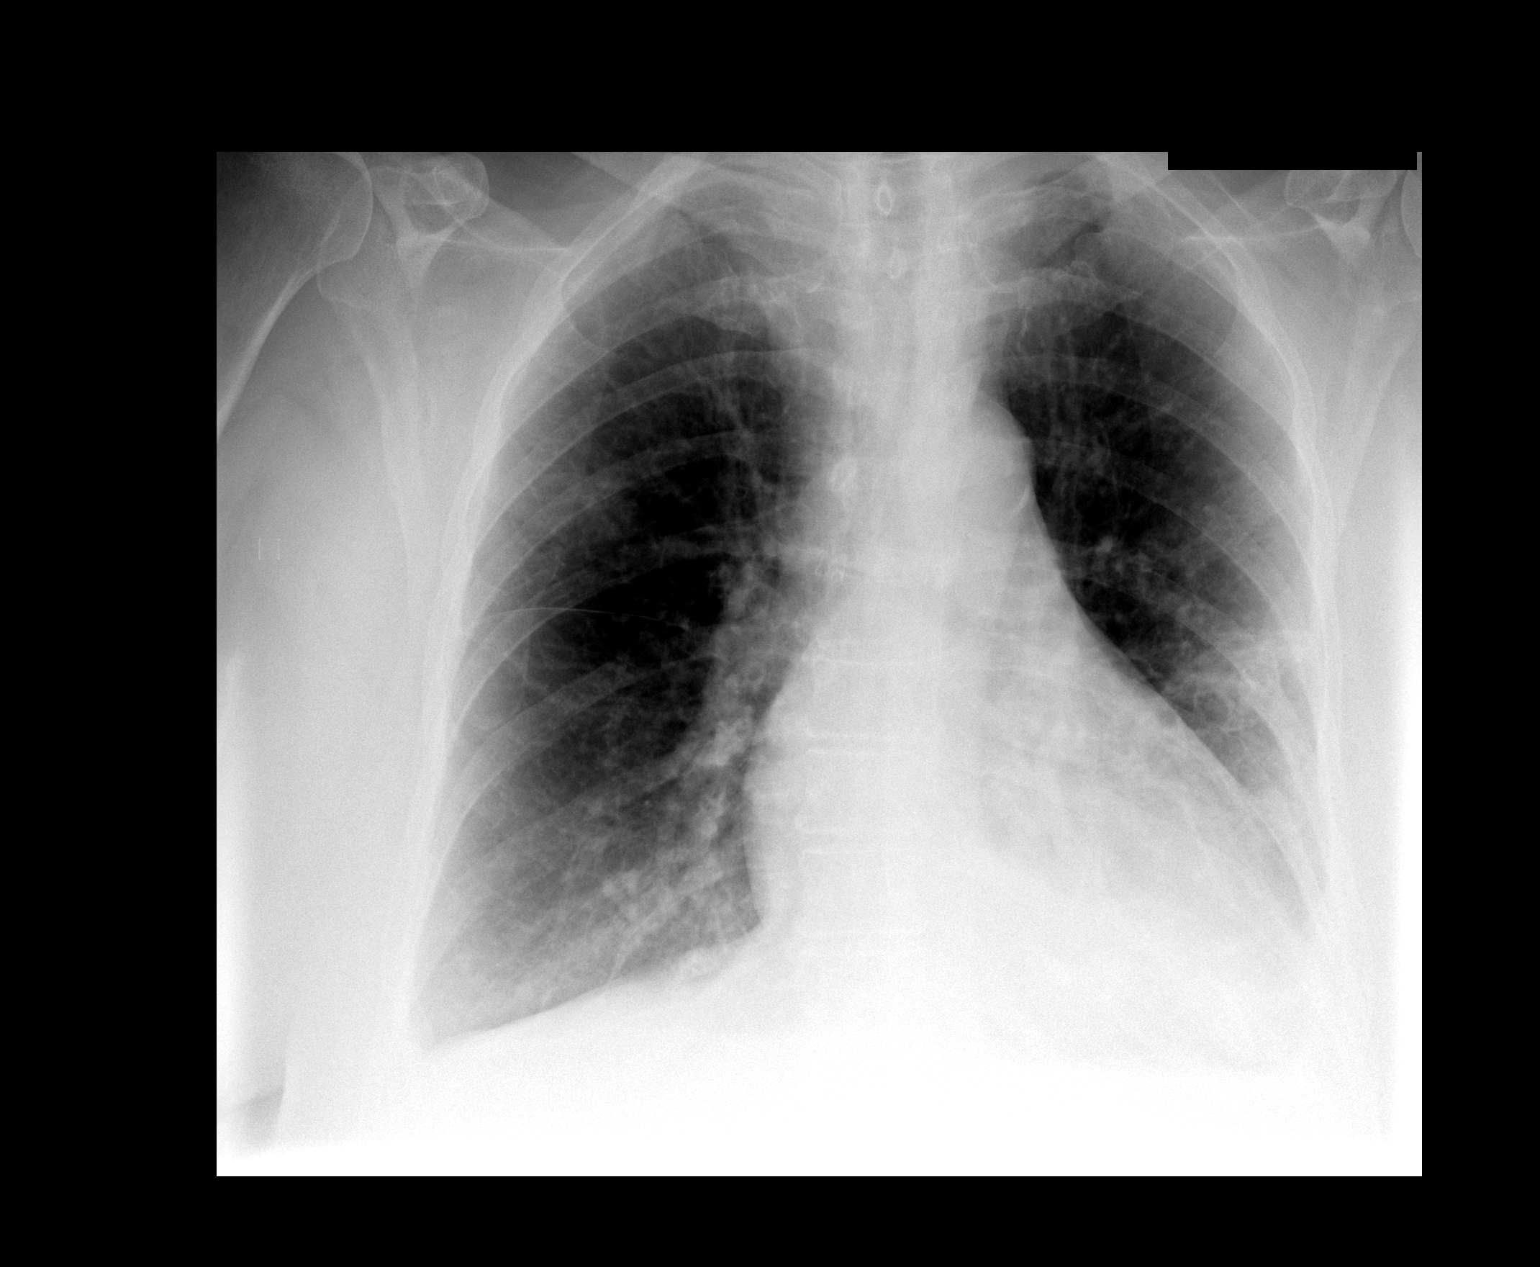

[2 of 2 positions shown; findings below may reference images not displayed]

FINDINGS: Improved aeration of both lung bases is seen.  There is
persistent infiltrate is seen in the peripheral left mid lung zone
which is not significantly changed in appearance.  Mild
cardiomegaly is stable and there is no definite evidence of
congestive heart failure.  Small pleural effusions are again seen
bilaterally, without significant change.
IMPRESSION: 1.  No significant change in the left mid lung infiltrate and small
bilateral pleural effusions.
2.  Stable cardiomegaly.

## 2012-09-05 ENCOUNTER — Emergency Department (HOSPITAL_COMMUNITY): Payer: Medicare Other

## 2012-09-05 ENCOUNTER — Emergency Department (HOSPITAL_COMMUNITY)
Admission: EM | Admit: 2012-09-05 | Discharge: 2012-09-05 | Disposition: A | Discharge status: Home / Self Care | Payer: Medicare Other | Attending: Emergency Medicine | Admitting: Emergency Medicine

## 2012-09-05 ENCOUNTER — Encounter (HOSPITAL_COMMUNITY): Payer: Self-pay | Admitting: Vascular Surgery

## 2012-09-05 DIAGNOSIS — N201 Calculus of ureter: Secondary | ICD-10-CM | POA: Insufficient documentation

## 2012-09-05 DIAGNOSIS — E785 Hyperlipidemia, unspecified: Secondary | ICD-10-CM | POA: Insufficient documentation

## 2012-09-05 DIAGNOSIS — N132 Hydronephrosis with renal and ureteral calculous obstruction: Secondary | ICD-10-CM

## 2012-09-05 DIAGNOSIS — I509 Heart failure, unspecified: Secondary | ICD-10-CM | POA: Insufficient documentation

## 2012-09-05 DIAGNOSIS — K219 Gastro-esophageal reflux disease without esophagitis: Secondary | ICD-10-CM | POA: Insufficient documentation

## 2012-09-05 DIAGNOSIS — Z79899 Other long term (current) drug therapy: Secondary | ICD-10-CM | POA: Insufficient documentation

## 2012-09-05 DIAGNOSIS — R109 Unspecified abdominal pain: Secondary | ICD-10-CM | POA: Insufficient documentation

## 2012-09-05 DIAGNOSIS — Z8679 Personal history of other diseases of the circulatory system: Secondary | ICD-10-CM | POA: Insufficient documentation

## 2012-09-05 DIAGNOSIS — M545 Low back pain, unspecified: Secondary | ICD-10-CM | POA: Insufficient documentation

## 2012-09-05 DIAGNOSIS — F0390 Unspecified dementia without behavioral disturbance: Secondary | ICD-10-CM | POA: Insufficient documentation

## 2012-09-05 DIAGNOSIS — Z8669 Personal history of other diseases of the nervous system and sense organs: Secondary | ICD-10-CM | POA: Insufficient documentation

## 2012-09-05 DIAGNOSIS — Z87440 Personal history of urinary (tract) infections: Secondary | ICD-10-CM | POA: Insufficient documentation

## 2012-09-05 DIAGNOSIS — Z8739 Personal history of other diseases of the musculoskeletal system and connective tissue: Secondary | ICD-10-CM | POA: Insufficient documentation

## 2012-09-05 DIAGNOSIS — Z9089 Acquired absence of other organs: Secondary | ICD-10-CM | POA: Insufficient documentation

## 2012-09-05 DIAGNOSIS — Z8673 Personal history of transient ischemic attack (TIA), and cerebral infarction without residual deficits: Secondary | ICD-10-CM | POA: Insufficient documentation

## 2012-09-05 DIAGNOSIS — N289 Disorder of kidney and ureter, unspecified: Secondary | ICD-10-CM

## 2012-09-05 DIAGNOSIS — Z8701 Personal history of pneumonia (recurrent): Secondary | ICD-10-CM | POA: Insufficient documentation

## 2012-09-05 DIAGNOSIS — R112 Nausea with vomiting, unspecified: Secondary | ICD-10-CM

## 2012-09-05 DIAGNOSIS — IMO0001 Reserved for inherently not codable concepts without codable children: Secondary | ICD-10-CM | POA: Insufficient documentation

## 2012-09-05 DIAGNOSIS — N1330 Unspecified hydronephrosis: Secondary | ICD-10-CM | POA: Insufficient documentation

## 2012-09-05 DIAGNOSIS — R011 Cardiac murmur, unspecified: Secondary | ICD-10-CM | POA: Insufficient documentation

## 2012-09-05 DIAGNOSIS — Z8709 Personal history of other diseases of the respiratory system: Secondary | ICD-10-CM | POA: Insufficient documentation

## 2012-09-05 DIAGNOSIS — I129 Hypertensive chronic kidney disease with stage 1 through stage 4 chronic kidney disease, or unspecified chronic kidney disease: Secondary | ICD-10-CM | POA: Insufficient documentation

## 2012-09-05 DIAGNOSIS — N189 Chronic kidney disease, unspecified: Secondary | ICD-10-CM | POA: Insufficient documentation

## 2012-09-05 LAB — URINALYSIS, ROUTINE W REFLEX MICROSCOPIC
Bilirubin Urine: NEGATIVE
Glucose, UA: NEGATIVE mg/dL
Hgb urine dipstick: NEGATIVE
Ketones, ur: NEGATIVE mg/dL
Nitrite: NEGATIVE
Protein, ur: NEGATIVE mg/dL
Specific Gravity, Urine: 1.009 (ref 1.005–1.030)
Urobilinogen, UA: 0.2 mg/dL (ref 0.0–1.0)
pH: 6.5 (ref 5.0–8.0)

## 2012-09-05 LAB — URINE MICROSCOPIC-ADD ON

## 2012-09-05 LAB — CBC WITH DIFFERENTIAL/PLATELET
Basophils Absolute: 0 K/uL (ref 0.0–0.1)
Basophils Relative: 0 % (ref 0–1)
Eosinophils Absolute: 0.1 K/uL (ref 0.0–0.7)
Eosinophils Relative: 1 % (ref 0–5)
HCT: 40.2 % (ref 36.0–46.0)
Hemoglobin: 13.8 g/dL (ref 12.0–15.0)
Lymphocytes Relative: 20 % (ref 12–46)
Lymphs Abs: 2.7 K/uL (ref 0.7–4.0)
MCH: 28.8 pg (ref 26.0–34.0)
MCHC: 34.3 g/dL (ref 30.0–36.0)
MCV: 83.8 fL (ref 78.0–100.0)
Monocytes Absolute: 0.9 10*3/uL (ref 0.1–1.0)
Monocytes Relative: 7 % (ref 3–12)
Neutro Abs: 9.7 10*3/uL — ABNORMAL HIGH (ref 1.7–7.7)
Neutrophils Relative %: 72 % (ref 43–77)
Platelets: 246 K/uL (ref 150–400)
RBC: 4.8 MIL/uL (ref 3.87–5.11)
RDW: 13.8 % (ref 11.5–15.5)
WBC: 13.4 K/uL — ABNORMAL HIGH (ref 4.0–10.5)

## 2012-09-05 LAB — COMPREHENSIVE METABOLIC PANEL WITH GFR
ALT: 10 U/L (ref 0–35)
AST: 18 U/L (ref 0–37)
Alkaline Phosphatase: 102 U/L (ref 39–117)
Calcium: 10 mg/dL (ref 8.4–10.5)
GFR calc Af Amer: 34 mL/min — ABNORMAL LOW (ref >90)
Glucose, Bld: 133 mg/dL — ABNORMAL HIGH (ref 70–99)
Potassium: 3.7 meq/L (ref 3.5–5.1)
Sodium: 137 meq/L (ref 135–145)
Total Protein: 7.6 g/dL (ref 6.0–8.3)

## 2012-09-05 LAB — COMPREHENSIVE METABOLIC PANEL
Albumin: 3.9 g/dL (ref 3.5–5.2)
BUN: 23 mg/dL (ref 6–23)
CO2: 29 mEq/L (ref 19–32)
Chloride: 97 mEq/L (ref 96–112)
Creatinine, Ser: 1.59 mg/dL — ABNORMAL HIGH (ref 0.50–1.10)
GFR calc non Af Amer: 29 mL/min — ABNORMAL LOW (ref >90)
Total Bilirubin: 0.5 mg/dL (ref 0.3–1.2)

## 2012-09-05 MED ORDER — METOPROLOL TARTRATE 1 MG/ML IV SOLN
2.5000 mg | Freq: Once | INTRAVENOUS | Status: AC
  Administered 2012-09-05: 2.5 mg via INTRAVENOUS
  Filled 2012-09-05: qty 5

## 2012-09-05 MED ORDER — ONDANSETRON HCL 4 MG/2ML IJ SOLN
4.0000 mg | Freq: Once | INTRAMUSCULAR | Status: AC
  Administered 2012-09-05: 4 mg via INTRAVENOUS

## 2012-09-05 MED ORDER — ONDANSETRON 8 MG PO TBDP: 8.0000 mg | ORAL_TABLET | Freq: Two times a day (BID) | ORAL | Status: DC | PRN

## 2012-09-05 MED ORDER — IOHEXOL 300 MG/ML  SOLN
20.0000 mL | INTRAMUSCULAR | Status: AC
  Administered 2012-09-05 (×2): 20 mL via ORAL

## 2012-09-05 MED ORDER — SODIUM CHLORIDE 0.9 % IV SOLN
Freq: Once | INTRAVENOUS | Status: AC
  Administered 2012-09-05: 19:00:00 via INTRAVENOUS

## 2012-09-05 MED ORDER — FENTANYL CITRATE 0.05 MG/ML IJ SOLN
50.0000 ug | INTRAMUSCULAR | Status: DC | PRN
  Administered 2012-09-05: 50 ug via INTRAVENOUS
  Filled 2012-09-05: qty 2

## 2012-09-05 MED ORDER — CIPROFLOXACIN HCL 500 MG PO TABS
250.0000 mg | ORAL_TABLET | Freq: Once | ORAL | Status: AC
  Administered 2012-09-05: 23:00:00 via ORAL
  Filled 2012-09-05: qty 1

## 2012-09-05 MED ORDER — ONDANSETRON HCL 4 MG/2ML IJ SOLN
4.0000 mg | Freq: Once | INTRAMUSCULAR | Status: AC
  Administered 2012-09-05: 4 mg via INTRAVENOUS
  Filled 2012-09-05: qty 2

## 2012-09-05 MED ORDER — HYDROCODONE-ACETAMINOPHEN 5-325 MG PO TABS: ORAL_TABLET | ORAL | Status: DC

## 2012-09-05 MED ORDER — CIPROFLOXACIN HCL 250 MG PO TABS: 250.0000 mg | ORAL_TABLET | Freq: Two times a day (BID) | ORAL | Status: DC

## 2012-09-05 NOTE — Discharge Instructions (Signed)
 Kidney Stones Kidney stones (ureteral lithiasis) are deposits that form inside your kidneys. The intense pain is caused by the stone moving through the urinary tract. When the stone moves, the ureter goes into spasm around the stone. The stone is usually passed in the urine.  CAUSES   A disorder that makes certain neck glands produce too much parathyroid hormone (primary hyperparathyroidism).  A buildup of uric acid crystals.  Narrowing (stricture) of the ureter.  A kidney obstruction present at birth (congenital obstruction).  Previous surgery on the kidney or ureters.  Numerous kidney infections. SYMPTOMS   Feeling sick to your stomach (nauseous).  Throwing up (vomiting).  Blood in the urine (hematuria).  Pain that usually spreads (radiates) to the groin.  Frequency or urgency of urination. DIAGNOSIS   Taking a history and physical exam.  Blood or urine tests.  Computerized X-ray scan (CT scan).  Occasionally, an examination of the inside of the urinary bladder (cystoscopy) is performed. TREATMENT   Observation.  Increasing your fluid intake.  Surgery may be needed if you have severe pain or persistent obstruction. The size, location, and chemical composition are all important variables that will determine the proper choice of action for you. Talk to your caregiver to better understand your situation so that you will minimize the risk of injury to yourself and your kidney.  HOME CARE INSTRUCTIONS   Drink enough water and fluids to keep your urine clear or pale yellow.  Strain all urine through the provided strainer. Keep all particulate matter and stones for your caregiver to see. The stone causing the pain may be as small as a grain of salt. It is very important to use the strainer each and every time you pass your urine. The collection of your stone will allow your caregiver to analyze it and verify that a stone has actually passed.  Only take over-the-counter or  prescription medicines for pain, discomfort, or fever as directed by your caregiver.  Make a follow-up appointment with your caregiver as directed.  Get follow-up X-rays if required. The absence of pain does not always mean that the stone has passed. It may have only stopped moving. If the urine remains completely obstructed, it can cause loss of kidney function or even complete destruction of the kidney. It is your responsibility to make sure X-rays and follow-ups are completed. Ultrasounds of the kidney can show blockages and the status of the kidney. Ultrasounds are not associated with any radiation and can be performed easily in a matter of minutes. SEEK IMMEDIATE MEDICAL CARE IF:   Pain cannot be controlled with the prescribed medicine.  You have a fever.  The severity or intensity of pain increases over 18 hours and is not relieved by pain medicine.  You develop a new onset of abdominal pain.  You feel faint or pass out. MAKE SURE YOU:   Understand these instructions.  Will watch your condition.  Will get help right away if you are not doing well or get worse. Document Released: 08/23/2005 Document Revised: 11/15/2011 Document Reviewed: 12/19/2009 [facility] Patient Information 2013 [place], [place].     Narcotic and benzodiazepine use may cause drowsiness, slowed breathing or dependence.  Please use with caution and do not drive, operate machinery or watch young children alone while taking them.  Taking combinations of these medications or drinking alcohol will potentiate these effects.

## 2012-09-05 NOTE — ED Provider Notes (Signed)
History     CSN: [id number]  Arrival date & time 09/05/12  1654   First MD Initiated Contact with Patient 09/05/12 1752      Chief Complaint  Patient presents with  . Influenza    (Consider location/radiation/quality/duration/timing/severity/associated sxs/prior treatment) HPI Comments: Level 5 caveat due to dementia. Patient has felt weaker gradual onset and now worse over the last 6 days. Patient has had several episodes of vomiting almost every day except for today. Her appetite is down and she feels generally weak and excessive fatigue that is worse with attempted exertion. She denies any diarrhea. She has a history of pneumonia and previous urinary tract infection but denies any dysuria. She has a mild dry cough. She reports myalgias but specifically a persistent and moderately severe sharp right-sided flank and low back pain. She reports that she has had an appendectomy before but no cholecystectomy. She denies any obvious sick contacts. Patient's daughter does live with the patient and helps care for her. They spoke to the patient's primary care physician's office, Dr. Valentina Lucks who recommended that she come to the emergency department for further evaluation and treatment, primarily for concern of dehydration. The patient has not been willing to and also due to the vomiting been able to take her usual oral medications for several days. She has a history of atrial fibrillation and has not been able to take her Xarelto.  She has a prior h/o stroke and blood clots.    Patient is a 78 y.o. female presenting with flu symptoms. The history is provided by the patient and a relative.  Influenza    Past Medical History  Diagnosis Date  . Hypertension   . Arthritis   . Diastolic dysfunction   . Hyperlipidemia   . Congestive heart failure     Congestive heart failure symptoms  . Heart murmur   . Dysrhythmia 05-10-12    hx. A. Fib., rate is controlled-tx. Xarelto  . Pneumonia 05-10-12    dx.  in July- tx. antibiotics  . Hearing loss 05-10-12    bilateral hearing aids.  Marland Kitchen Dyspnea 05-10-12    recent 2-3 weeks hospital visit 7'13 pneumonia.  . Stroke 05-10-12    Lt.CVA note on scans-hx. freq. falls  . GERD (gastroesophageal reflux disease) 05-10-12    tx. with meds as needed  . Chronic kidney disease 05-10-12    hx. recent kidney stones passed.    Past Surgical History  Procedure Date  . Appendectomy   . Other surgical history     Bladder Surgery  . Interstim implant placement 05-10-12    now malfunctioning  . Cataract extraction, bilateral 05-10-12    bilateral  . Interstim implant removal 05/16/2012    Procedure: REMOVAL OF INTERSTIM IMPLANT;  Surgeon: Martina Sinner, MD;  Location: [facility];  Service: Urology;  Laterality: N/A;    Family History  Problem Relation Age of Onset  . Stroke Mother   . Stroke Brother     History  Substance Use Topics  . Smoking status: Never Smoker   . Smokeless tobacco: Not on file  . Alcohol Use: No    OB History    Grav Para Term Preterm Abortions TAB SAB Ect Mult Living                  Review of Systems  Unable to perform ROS: Dementia    Allergies  Codeine; Penicillins; and Sulfur  Home Medications   Current Outpatient Rx  Name  Route  Sig  Dispense  Refill  . ACETAMINOPHEN ER 650 MG PO TBCR   Oral   Take 650 mg by mouth every 8 (eight) hours as needed. For pain         . ATORVASTATIN CALCIUM 20 MG PO TABS   Oral   Take 1 tablet (20 mg total) by mouth at bedtime.   30 tablet   11   . DILTIAZEM HCL ER COATED BEADS 180 MG PO CP24   Oral   Take 1 capsule (180 mg total) by mouth daily.   30 capsule   11   . DOCUSATE SODIUM 100 MG PO CAPS   Oral   Take 100 mg by mouth daily as needed. For constipation         . DULOXETINE HCL 60 MG PO CPEP   Oral   Take 60 mg by mouth every morning.          Marland Kitchen OMEGA-3 FATTY ACIDS 1000 MG PO CAPS   Oral   Take 1 g by mouth daily.         . FUROSEMIDE 20 MG PO TABS    Oral   Take 2 tablets (40 mg total) by mouth daily.   30 tablet   0   . METOPROLOL TARTRATE 25 MG PO TABS   Oral   Take 50 mg by mouth 2 (two) times daily.          Marland Kitchen POTASSIUM CHLORIDE CRYS ER 20 MEQ PO TBCR   Oral   Take 1 tablet (20 mEq total) by mouth daily.   30 tablet   11   . RANITIDINE HCL 150 MG PO CAPS   Oral   Take 150 mg by mouth daily as needed. Reflux         . RIVAROXABAN 20 MG PO TABS   Oral   Take 20 mg by mouth every morning.         Marland Kitchen ZOLPIDEM TARTRATE 10 MG PO TABS   Oral   Take 10 mg by mouth at bedtime as needed. For sleep           BP 168/83  Pulse 88  Temp 98.1 F (36.7 C) (Oral)  Resp 13  SpO2 97%  Physical Exam  Nursing note and vitals reviewed. Constitutional: She appears well-developed and well-nourished. No distress.  HENT:  Head: Normocephalic.  Eyes: EOM are normal. Pupils are equal, round, and reactive to light.  Neck: Normal range of motion. Neck supple.  Cardiovascular: Normal rate.  An irregular rhythm present.  No murmur heard. Pulmonary/Chest: Effort normal. No respiratory distress. She has no wheezes. She has no rales.  Abdominal: Soft. She exhibits no distension. There is no tenderness. There is no rebound and no guarding.  Musculoskeletal: She exhibits no edema.  Lymphadenopathy:    She has no cervical adenopathy.  Neurological: She is alert. She exhibits normal muscle tone.  Skin: Skin is warm and dry. No rash noted. She is not diaphoretic. No pallor.    ED Course  Procedures (including critical care time)  Labs Reviewed  URINALYSIS, ROUTINE W REFLEX MICROSCOPIC - Abnormal; Notable for the following:    APPearance HAZY (*)     Leukocytes, UA MODERATE (*)     All other components within normal limits  CBC WITH DIFFERENTIAL - Abnormal; Notable for the following:    WBC 13.4 (*)     Neutro Abs 9.7 (*)     All other components within  normal limits  COMPREHENSIVE METABOLIC PANEL - Abnormal; Notable for the  following:    Glucose, Bld 133 (*)     Creatinine, Ser 1.59 (*)     GFR calc non Af Amer 29 (*)     GFR calc Af Amer 34 (*)     All other components within normal limits  URINE MICROSCOPIC-ADD ON - Abnormal; Notable for the following:    Squamous Epithelial / LPF FEW (*)     Bacteria, UA FEW (*)     All other components within normal limits  URINE CULTURE   Ct Abdomen Pelvis Wo Contrast  09/05/2012  *RADIOLOGY REPORT*  Clinical Data: Flank pain, back pain, vomiting  CT ABDOMEN AND PELVIS WITHOUT CONTRAST  Technique:  Multidetector CT imaging of the abdomen and pelvis was performed following the standard protocol without intravenous contrast.  Comparison: None.  Findings:  Renal:  There is moderate to severe hydronephrosis and hydroureter of the right renal collecting system secondary to an obstructing calculus within the distal right ureter.  The calculus measures 6 mm and is approximately 2 cm from vesicoureteral junction.  No additional renal calculi are present on the left or right.  There are bilateral low-density renal cysts. Distal right ureteral calculus is not readily evident on the CT scout.  Lung bases are clear.  No pericardial fluid.  Non-IV contrast images demonstrate no focal hepatic lesion.  The gallbladder, pancreas, spleen, adrenal gland are normal.  The stomach, small bowel, and colon are normal.  Abdominal aorta normal caliber.  No retroperitoneal periportal lymphadenopathy.  No free fluid the pelvis.  There are no calculi within the bladder. The uterus is normal.  The ovaries are normal.  No pelvic lymphadenopathy. Review of  bone windows demonstrates no aggressive osseous lesions.  IMPRESSION:   Moderate to severe right hydronephrosis and hydroureter secondary to an obstructing calculus within the distal right ureter.   Original Report Authenticated By: Genevive Bi, M.D.    Dg Chest Port 1 View  09/05/2012  *RADIOLOGY REPORT*  Clinical Data: Cough with flank pain  PORTABLE  CHEST - 1 VIEW  Comparison: 05/10/2012  Findings: Hyperexpansion is consistent with emphysema. Interstitial markings are diffusely coarsened with chronic features. The cardiopericardial silhouette is enlarged.  No edema or focal airspace consolidation.  No pleural effusion. Imaged bony structures of the thorax are intact. Telemetry leads overlie the chest.  IMPRESSION: Cardiomegaly with emphysema.  No acute cardiopulmonary findings.   Original Report Authenticated By: Kennith Center, M.D.    I reviewed the above CT  And radiologist report.  Obstructing ureteral stone which fits with pt's clinical picture.    1. Ureteral stone with hydronephrosis   2. Renal insufficiency, mild   3. Nausea and vomiting     Room air saturation is 97% I interpret this to be normal.  11:04 PM Patient reports feeling much better. She is taking a by mouth challenge and if she is able to even drink, the patient is sent to go home. She has been rehydrated with IV fluids. The CT results have been discussed with the patient and family. I've also sent a message to the patient's primary care physician to encourage close followup and to recheck her creatinine. She does have a few white cells in her urinalysis, therefore we'll put her on some low-dose Cipro. Patient has been seen by urology in the past the patient can followup with her urologist as well. Since the stone is 2 mm in her distal UVJ,  I anticipate that she'll pass a stone in the near future. Her prescription for pain medication nausea medication are also prescribed.  MDM  Reviewed past medical history in epic. Patient did have a urologic procedure, removal of some hardware from her ureter in September of this year.   Patient symptoms of flank pain with persistent vomiting leads me to be concerned for an atypical biliary issue versus ureteral stone or polynephritis. She does have evidence of some mild pyuria but is not significant enough to warrant her symptoms in my  opinion. Her electrolytes suggest that she is dehydrated with a bump in her creatinine. However she is not hypotensive nor tachycardic at this time. She is not febrile. Plan is to give IV fluids, IV antiemetics and will continue to monitor. Will also get a noncontrast to abdominal and pelvic CT scan due to her flank pain and vomiting.        Gavin Pound. Oletta Lamas, MD 09/05/12 [phone]

## 2012-09-05 NOTE — ED Notes (Signed)
Pt reports to the ED for eval of flu-like symptoms. Pt reports increasing malaise, weakness, nausea, vomiting and anorexia since last Wednesday. Pt also reports right sided flank and back pain x 1 week. Pain does not radiate. Pt denies any bowel or bladder incontinence, paralysis, and numbness or tingling. Pt reports that she has been unable to take medicine today due to nausea. Pt reports decreased urinary output but denies any urinary frequency, urgency, or dysuria. Pt has hx of A. Fib and is A. Fib on the monitor. Rate ranges from 90s to 100s. Pt denies CP but reports some episodes of SOB.

## 2012-09-07 LAB — URINE CULTURE: Colony Count: >=100000

## 2012-11-22 ENCOUNTER — Telehealth: Payer: Self-pay | Admitting: Cardiovascular Disease

## 2012-11-22 NOTE — Telephone Encounter (Signed)
Unable to reach patient. Called Wallis Bamberg, RN w/[employer] regarding consent for ECHO results to be faxed. LMTCB

## 2012-11-22 NOTE — Telephone Encounter (Signed)
New problem    Pt is in a heart failure program and she need copy of most recent Echo or EF results and fax to [phone].

## 2013-01-05 ENCOUNTER — Encounter: Payer: Self-pay | Admitting: Cardiovascular Disease

## 2013-02-12 ENCOUNTER — Encounter: Payer: Self-pay | Admitting: Cardiovascular Disease

## 2013-02-12 ENCOUNTER — Ambulatory Visit (INDEPENDENT_AMBULATORY_CARE_PROVIDER_SITE_OTHER): Payer: Medicare Other | Admitting: Cardiovascular Disease

## 2013-02-12 VITALS — BP 128/74 | HR 64 | Ht 68.0 in | Wt 183.0 lb

## 2013-02-12 DIAGNOSIS — I4891 Unspecified atrial fibrillation: Secondary | ICD-10-CM

## 2013-02-12 DIAGNOSIS — I5033 Acute on chronic diastolic (congestive) heart failure: Secondary | ICD-10-CM

## 2013-02-12 DIAGNOSIS — I509 Heart failure, unspecified: Secondary | ICD-10-CM

## 2013-02-12 MED ORDER — FUROSEMIDE 40 MG PO TABS: 40.0000 mg | ORAL_TABLET | Freq: Every day | ORAL | Status: DC

## 2013-02-12 NOTE — Patient Instructions (Addendum)
Your physician wants you to follow-up in: 1 YEAR  You will receive a reminder letter in the mail two months in advance. If you don't receive a letter, please call our office to schedule the follow-up appointment.  Your physician recommends that you continue on your current medications as directed. Please refer to the Current Medication list given to you today.   Your physician has recommended you make the following change in your medication:   YOUR LASIX WAS REORDERED IN 40 MG TABLETS

## 2013-02-12 NOTE — Assessment & Plan Note (Signed)
She's been diagnosed with chronic diastolic congestive heart failure. At present, but she's doing fairly well. We will change her furosemide tablets 40 mg tablets. We will not change the dose. I'll see her again in one year.  She has scales from her insurance company and she weigh herself daily.    I will see her again in 1 year - sooner if needed.

## 2013-02-12 NOTE — Assessment & Plan Note (Signed)
Carrie Best seems to be doing OK.  Her HR is well controlled.  She is not limited by her cardiac issues. At this point I think we should continue with chronic anticoagulation and rate control. I do not think that cardioversion would necessarily help her.

## 2013-02-12 NOTE — Progress Notes (Signed)
Carrie Best Date of Birth  1928/06/19       [facility]    [place] [address]  [address] [place], [place]  [address]   [place], [place]  [address] [phone]     [phone]   Fax  [phone]    Fax [phone]  Problem List: 1. Atrial fibrillation 2. Hypertension 3. Dementia 4. Diastolic dysfunction 5. Hyperlipidemia  History of Present Illness:  Carrie Best is an 79 yo who I have seen in the past.  She was admitted with respiratory failure last year.   She was found to  She has developed some dementia.  She has had frequent episodes of dyspnea.    Typically with exertion.   She does not eat any extra salt.    She still feels poorly - has good days and bad days   She is not able to walk any distance primarily due to leg weakness and leg pain.    Current Outpatient Prescriptions on File Prior to Visit  Medication Sig Dispense Refill  . acetaminophen (TYLENOL ARTHRITIS PAIN) 650 MG CR tablet Take 650 mg by mouth every 8 (eight) hours as needed. For pain      . atorvastatin (LIPITOR) 20 MG tablet Take 1 tablet (20 mg total) by mouth at bedtime.  30 tablet  11  . diltiazem (CARDIZEM CD) 180 MG 24 hr capsule Take 1 capsule (180 mg total) by mouth daily.  30 capsule  11  . docusate sodium (COLACE) 100 MG capsule Take 100 mg by mouth daily as needed. For constipation      . DULoxetine (CYMBALTA) 60 MG capsule Take 60 mg by mouth every morning.       . fish oil-omega-3 fatty acids 1000 MG capsule Take 1 g by mouth daily.      . furosemide (LASIX) 20 MG tablet Take 2 tablets (40 mg total) by mouth daily.  30 tablet  0  . metoprolol tartrate (LOPRESSOR) 25 MG tablet Take 50 mg by mouth 2 (two) times daily.       . ondansetron (ZOFRAN-ODT) 8 MG disintegrating tablet Take 1 tablet (8 mg total) by mouth every 12 (twelve) hours as needed for nausea.  20 tablet  0  . potassium chloride SA (K-DUR,KLOR-CON) 20 MEQ tablet Take 1  tablet (20 mEq total) by mouth daily.  30 tablet  11  . ranitidine (ZANTAC) 150 MG capsule Take 150 mg by mouth daily as needed. Reflux      . Rivaroxaban (XARELTO) 20 MG TABS Take 20 mg by mouth every morning.      . zolpidem (AMBIEN) 10 MG tablet Take 10 mg by mouth at bedtime as needed. For sleep       No current facility-administered medications on file prior to visit.    Allergies  Allergen Reactions  . Codeine Nausea And Vomiting  . Penicillins Hives  . Sulfur Nausea And Vomiting    Past Medical History  Diagnosis Date  . Hypertension   . Arthritis   . Diastolic dysfunction   . Hyperlipidemia   . Congestive heart failure     Congestive heart failure symptoms  . Heart murmur   . Dysrhythmia 05-10-12    hx. A. Fib., rate is controlled-tx. Xarelto  . Pneumonia 05-10-12    dx. in July- tx. antibiotics  . Hearing loss 05-10-12    bilateral hearing aids.  Marland Kitchen Dyspnea 05-10-12  recent 2-3 weeks hospital visit 7'13 pneumonia.  . Stroke 05-10-12    Lt.CVA note on scans-hx. freq. falls  . GERD (gastroesophageal reflux disease) 05-10-12    tx. with meds as needed  . Chronic kidney disease 05-10-12    hx. recent kidney stones passed.    Past Surgical History  Procedure Laterality Date  . Appendectomy    . Other surgical history      Bladder Surgery  . Interstim implant placement  05-10-12    now malfunctioning  . Cataract extraction, bilateral  05-10-12    bilateral  . Interstim implant removal  05/16/2012    Procedure: REMOVAL OF INTERSTIM IMPLANT;  Surgeon: Martina Sinner, MD;  Location: [facility];  Service: Urology;  Laterality: N/A;    History  Smoking status  . Never Smoker   Smokeless tobacco  . Not on file    History  Alcohol Use No    Family History  Problem Relation Age of Onset  . Stroke Mother   . Stroke Brother     Reviw of Systems:  Reviewed in the HPI.  All other systems are negative.  Physical Exam: Blood pressure 128/74, pulse 64, height 5\' 8"  (1.727  m), weight 183 lb (83.008 kg). General: Well developed, well nourished, in no acute distress.  Head: Normocephalic, atraumatic, sclera non-icteric, mucus membranes are moist,   Neck: Supple. Carotids are 2 + without bruits. No JVD   Lungs: Clear   Heart: irregularly irregular  Abdomen: Soft, non-tender, non-distended with normal bowel sounds.  Msk:  Strength and tone are normal   Extremities: No clubbing or cyanosis. No edema.  Distal pedal pulses are 2+ and equal    Neuro: CN II - XII intact.  Alert and oriented X 3.   Psych:  Seems to be forgetful.  Has at least some degree of dementia  ECG: February 12, 2013:  Atrial fibrillation with a V rate of 64.  Marland Kitchen  [facility],   Assessment / Plan:

## 2013-11-24 ENCOUNTER — Encounter: Payer: Self-pay | Admitting: *Deleted

## 2014-01-02 ENCOUNTER — Other Ambulatory Visit: Payer: Self-pay | Admitting: Urology

## 2014-01-16 ENCOUNTER — Encounter: Payer: Self-pay | Admitting: Cardiovascular Disease

## 2014-01-16 ENCOUNTER — Ambulatory Visit (INDEPENDENT_AMBULATORY_CARE_PROVIDER_SITE_OTHER): Payer: Medicare Other | Admitting: Cardiovascular Disease

## 2014-01-16 DIAGNOSIS — E785 Hyperlipidemia, unspecified: Secondary | ICD-10-CM

## 2014-01-16 DIAGNOSIS — I509 Heart failure, unspecified: Secondary | ICD-10-CM

## 2014-01-16 DIAGNOSIS — I4891 Unspecified atrial fibrillation: Secondary | ICD-10-CM

## 2014-01-16 DIAGNOSIS — I10 Essential (primary) hypertension: Secondary | ICD-10-CM

## 2014-01-16 DIAGNOSIS — I5033 Acute on chronic diastolic (congestive) heart failure: Secondary | ICD-10-CM

## 2014-01-16 NOTE — Assessment & Plan Note (Signed)
Carrie Best is doing well. She's not having any shortness of breath that she can remember. Her exam seems to be very stable. She should be at low risk for upcoming neurology surgery. We will hold her Xarelto for 2 days prior to her surgery. She has moderate renal insufficiency. She has chronic-stable atrial fibrillation. She is to restart her Xarelto the day after her surgery.

## 2014-01-16 NOTE — Progress Notes (Signed)
Carrie Best Date of Birth  Jan 29, 1928       [facility]    [place] [facility] [address], [address]  [address], [address] [place]  [address]   [place]  [id number] [phone]     [phone]   Fax  [phone]    Fax [phone]  Problem List: 1. Atrial fibrillation 2. Hypertension 3. Dementia 4. Diastolic dysfunction 5. Hyperlipidemia  History of Present Illness:  Carrie Best is an 80 yo who I have seen in the past.  She was admitted with respiratory failure last year.   She was found to  She has developed some dementia.  She has had frequent episodes of dyspnea.    Typically with exertion.   She does not eat any extra salt.    She still feels poorly - has good days and bad days   She is not able to walk any distance primarily due to leg weakness and leg pain.   Jan 16, 2014:  Carrie Best is doing oK.  Able to do her normal activies with minimal dyspnea ( she does not do much).      Current Outpatient Prescriptions on File Prior to Visit  Medication Sig Dispense Refill  . acetaminophen (TYLENOL ARTHRITIS PAIN) 650 MG CR tablet Take 650 mg by mouth every 8 (eight) hours as needed. For pain      . atorvastatin (LIPITOR) 20 MG tablet Take 1 tablet (20 mg total) by mouth at bedtime.  30 tablet  11  . diltiazem (CARDIZEM CD) 180 MG 24 hr capsule Take 1 capsule (180 mg total) by mouth daily.  30 capsule  11  . docusate sodium (COLACE) 100 MG capsule Take 100 mg by mouth daily as needed. For constipation      . DULoxetine (CYMBALTA) 60 MG capsule Take 60 mg by mouth every morning.       . fish oil-omega-3 fatty acids 1000 MG capsule Take 1 g by mouth daily.      . furosemide (LASIX) 40 MG tablet Take 1 tablet (40 mg total) by mouth daily.  30 tablet  11  . metoprolol tartrate (LOPRESSOR) 25 MG tablet Take 50 mg by mouth 2 (two) times daily.       . ondansetron (ZOFRAN-ODT) 8 MG disintegrating tablet Take 1 tablet (8 mg total) by  mouth every 12 (twelve) hours as needed for nausea.  20 tablet  0  . potassium chloride SA (K-DUR,KLOR-CON) 20 MEQ tablet Take 1 tablet (20 mEq total) by mouth daily.  30 tablet  11  . ranitidine (ZANTAC) 150 MG capsule Take 150 mg by mouth daily as needed. Reflux      . Rivaroxaban (XARELTO) 20 MG TABS Take 20 mg by mouth every morning.      . zolpidem (AMBIEN) 10 MG tablet Take 10 mg by mouth at bedtime as needed. For sleep       No current facility-administered medications on file prior to visit.    Allergies  Allergen Reactions  . Codeine Nausea And Vomiting  . Penicillins Hives  . Sulfur Nausea And Vomiting    Past Medical History  Diagnosis Date  . Hypertension   . Arthritis   . Diastolic dysfunction   . Hyperlipidemia   . Congestive heart failure     Congestive heart failure symptoms  . Heart murmur   . Dysrhythmia 05-10-12    hx. A. Fib., rate is controlled-tx. Xarelto  .  Pneumonia 05-10-12    dx. in July- tx. antibiotics  . Hearing loss 05-10-12    bilateral hearing aids.  Marland Kitchen. Dyspnea 05-10-12    recent 2-3 weeks hospital visit 7'13 pneumonia.  . Stroke 05-10-12    Lt.CVA note on scans-hx. freq. falls  . GERD (gastroesophageal reflux disease) 05-10-12    tx. with meds as needed  . Chronic kidney disease 05-10-12    hx. recent kidney stones passed.    Past Surgical History  Procedure Laterality Date  . Appendectomy    . Other surgical history      Bladder Surgery  . Interstim implant placement  05-10-12    now malfunctioning  . Cataract extraction, bilateral  05-10-12    bilateral  . Interstim implant removal  05/16/2012    Procedure: REMOVAL OF INTERSTIM IMPLANT;  Surgeon: Martina SinnerScott A MacDiarmid, MD;  Location: [facility];  Service: Urology;  Laterality: N/A;    History  Smoking status  . Never Smoker   Smokeless tobacco  . Not on file    History  Alcohol Use No    Family History  Problem Relation Age of Onset  . Stroke Mother   . Stroke Brother     Reviw of  Systems:  Reviewed in the HPI.  All other systems are negative.  Physical Exam: Blood pressure 140/62, pulse 63, height 5\' 8"  (1.727 m), weight 195 lb 1.9 oz (88.506 kg). General: Well developed, well nourished, in no acute distress.  Head: Normocephalic, atraumatic, sclera non-icteric, mucus membranes are moist,   Neck: Supple. Carotids are 2 + without bruits. No JVD   Lungs: Clear   Heart: irregularly irregular  Abdomen: Soft, non-tender, non-distended with normal bowel sounds.  Msk:  Strength and tone are normal   Extremities: No clubbing or cyanosis. No edema.  Distal pedal pulses are 2+ and equal    Neuro: CN II - XII intact.  Alert and oriented X 3.   Psych:  Seems to be forgetful.  Has at least some degree of dementia  ECG: Jan 16, 2014:  Atrial fib.  Rate of 63. NS ST abn.   Assessment / Plan:

## 2014-01-16 NOTE — Patient Instructions (Addendum)
Hold you Xarelto on May 26, and May 27.  Your surgery is on May 28  Restart on May 29 if the surgery goes well.    Your physician wants you to follow-up in: 1 year with Dr. Elease HashimotoNahser.  You will receive a reminder letter in the mail two months in advance. If you don't receive a letter, please call our office to schedule the follow-up appointment. Your physician recommends that you return for lab work at your 1 year office visit with Dr. Elease HashimotoNahser or a few days before.  You will need to fast for this appointment (nothing to eat or drink after midnight the night before except water)

## 2014-01-16 NOTE — Assessment & Plan Note (Signed)
Her atrial fibrillation is stable. Continue current medications.

## 2014-01-17 ENCOUNTER — Encounter (HOSPITAL_COMMUNITY): Payer: Self-pay | Admitting: Pharmacy Technician

## 2014-01-22 ENCOUNTER — Ambulatory Visit (HOSPITAL_COMMUNITY)
Admission: RE | Admit: 2014-01-22 | Discharge: 2014-01-22 | Disposition: A | Discharge status: Home / Self Care | Payer: Medicare Other | Source: Ambulatory Visit | Attending: Anesthesiology | Admitting: Anesthesiology

## 2014-01-22 ENCOUNTER — Encounter (HOSPITAL_COMMUNITY): Payer: Self-pay

## 2014-01-22 ENCOUNTER — Encounter (HOSPITAL_COMMUNITY)
Admission: RE | Admit: 2014-01-22 | Discharge: 2014-01-22 | Disposition: A | Discharge status: Home / Self Care | Payer: Medicare Other | Source: Ambulatory Visit | Attending: Urology | Admitting: Urology

## 2014-01-22 DIAGNOSIS — I517 Cardiomegaly: Secondary | ICD-10-CM | POA: Insufficient documentation

## 2014-01-22 DIAGNOSIS — I10 Essential (primary) hypertension: Secondary | ICD-10-CM | POA: Insufficient documentation

## 2014-01-22 HISTORY — DX: Depression, unspecified: F32.A

## 2014-01-22 HISTORY — DX: Major depressive disorder, single episode, unspecified: F32.9

## 2014-01-22 LAB — CBC
HEMATOCRIT: 35.2 % — AB (ref 36.0–46.0)
Hemoglobin: 12.4 g/dL (ref 12.0–15.0)
MCH: 29.5 pg (ref 26.0–34.0)
MCHC: 35.2 g/dL (ref 30.0–36.0)
MCV: 83.8 fL (ref 78.0–100.0)
PLATELETS: 194 10*3/uL (ref 150–400)
RBC: 4.2 MIL/uL (ref 3.87–5.11)
RDW: 14 % (ref 11.5–15.5)
WBC: 7.6 10*3/uL (ref 4.0–10.5)

## 2014-01-22 LAB — BASIC METABOLIC PANEL
BUN: 15 mg/dL (ref 6–23)
CHLORIDE: 98 meq/L (ref 96–112)
CO2: 27 meq/L (ref 19–32)
CREATININE: 1.06 mg/dL (ref 0.50–1.10)
Calcium: 9.8 mg/dL (ref 8.4–10.5)
GFR calc non Af Amer: 47 mL/min — ABNORMAL LOW (ref >90)
GFR, EST AFRICAN AMERICAN: 54 mL/min — AB (ref >90)
Glucose, Bld: 146 mg/dL — ABNORMAL HIGH (ref 70–99)
Potassium: 3.6 mEq/L — ABNORMAL LOW (ref 3.7–5.3)
SODIUM: 137 meq/L (ref 137–147)

## 2014-01-22 NOTE — Progress Notes (Signed)
EKG, with stress test and OV Dr Melburn PopperNasher 5/15  epic

## 2014-01-22 NOTE — Patient Instructions (Signed)
Your procedure is scheduled on:  01/31/14  THURSDAY  [facility] at   0830    AM.  Call this number if you have problems the morning of surgery: [phone]        Do not eat food  Or drink :After Midnight. Wednesday NIGHT   Take these medicines the morning of surgery with A SIP OF WATER: Diltiazem, Duloxetine,  METOPROLOL, Ranitidine May take Lorazepam if needed   .  Contacts, dentures or partial plates, or metal hairpins  can not be worn to surgery. Your family will be responsible for glasses, dentures, hearing aides while you are in surgery  Leave suitcase in the car. After surgery it may be brought to your room.  For patients admitted to the hospital, checkout time is 11:00 AM day of  discharge.                DO NOT WEAR JEWELRY, LOTIONS, POWDERS, OR PERFUMES.  WOMEN-- DO NOT SHAVE LEGS OR UNDERARMS FOR 48 HOURS BEFORE SHOWERS. MEN MAY SHAVE FACE.  Patients discharged the day of surgery will not be allowed to drive home. IF going home the day of surgery, you must have a driver and someone to stay with you for the first 24 hours  Name and phone number of your driver:   Daughter  [facility]                                                                                                                                      [facility] - Preparing for Surgery Before surgery, you can play an important role.  Because skin is not sterile, your skin needs to be as free of germs as possible.  You can reduce the number of germs on your skin by washing with CHG (chlorahexidine gluconate) soap before surgery.  CHG is an antiseptic cleaner which kills germs and bonds with the skin to continue killing germs even after washing. Please DO NOT use if you have an allergy to CHG or antibacterial soaps.  If your skin becomes reddened/irritated stop using the CHG and inform your nurse when you arrive at Short Stay. Do not shave (including legs and underarms) for at least 48 hours  prior to the first CHG shower.  You may shave your face/neck. Please follow these instructions carefully:  1.  Shower with CHG Soap the night before surgery and the  morning of Surgery.  2.  If you choose to wash your hair, wash your hair first as usual with your  normal  shampoo.  3.  After you shampoo, rinse your hair and body thoroughly to remove the  shampoo.                           4.  Use CHG as you would any other liquid  soap.  You can apply chg directly  to the skin and wash                       Gently with a scrungie or clean washcloth.  5.  Apply the CHG Soap to your body ONLY FROM THE NECK DOWN.   Do not use on face/ open                           Wound or open sores. Avoid contact with eyes, ears mouth and genitals (private parts).                       Wash face,  Genitals (private parts) with your normal soap.             6.  Wash thoroughly, paying special attention to the area where your surgery  will be performed.  7.  Thoroughly rinse your body with warm water from the neck down.  8.  DO NOT shower/wash with your normal soap after using and rinsing off  the CHG Soap.                9.  Pat yourself dry with a clean towel.            10.  Wear clean pajamas.            11.  Place clean sheets on your bed the night of your first shower and do not  sleep with pets. Day of Surgery : Do not apply any lotions the morning of surgery.  Please wear clean clothes to the hospital/surgery center.  FAILURE TO FOLLOW THESE INSTRUCTIONS MAY RESULT IN THE CANCELLATION OF YOUR SURGERY PATIENT SIGNATURE_________________________________  NURSE SIGNATURE__________________________________  ________________________________________________________________________

## 2014-01-22 NOTE — Progress Notes (Signed)
Daughter, Darl PikesSusan states at PST appt that the Gibson RampXeralto will be held 2 days pre op

## 2014-01-30 MED ORDER — GENTAMICIN SULFATE 40 MG/ML IJ SOLN
400.0000 mg | INTRAVENOUS | Status: AC
  Administered 2014-01-31: 400 mg via INTRAVENOUS
  Filled 2014-01-30: qty 10

## 2014-01-31 ENCOUNTER — Ambulatory Visit (HOSPITAL_COMMUNITY)
Admission: RE | Admit: 2014-01-31 | Discharge: 2014-01-31 | Disposition: A | Discharge status: Home / Self Care | Payer: Medicare Other | Source: Ambulatory Visit | Attending: Urology | Admitting: Urology

## 2014-01-31 ENCOUNTER — Ambulatory Visit (HOSPITAL_COMMUNITY): Payer: Medicare Other | Admitting: Certified Registered"

## 2014-01-31 ENCOUNTER — Encounter (HOSPITAL_COMMUNITY)
Admission: RE | Disposition: A | Discharge status: Home / Self Care | Payer: Self-pay | Source: Ambulatory Visit | Attending: Urology

## 2014-01-31 ENCOUNTER — Encounter (HOSPITAL_COMMUNITY): Payer: Medicare Other | Admitting: Certified Registered"

## 2014-01-31 ENCOUNTER — Encounter (HOSPITAL_COMMUNITY): Payer: Self-pay | Admitting: *Deleted

## 2014-01-31 DIAGNOSIS — R1900 Intra-abdominal and pelvic swelling, mass and lump, unspecified site: Secondary | ICD-10-CM | POA: Insufficient documentation

## 2014-01-31 DIAGNOSIS — N201 Calculus of ureter: Secondary | ICD-10-CM | POA: Insufficient documentation

## 2014-01-31 HISTORY — PX: HOLMIUM LASER APPLICATION: SHX5852

## 2014-01-31 HISTORY — PX: CYSTOSCOPY WITH RETROGRADE PYELOGRAM, URETEROSCOPY AND STENT PLACEMENT: SHX5789

## 2014-01-31 LAB — PROTIME-INR
INR: 1.09 (ref 0.00–1.49)
Prothrombin Time: 13.9 seconds (ref 11.6–15.2)

## 2014-01-31 SURGERY — CYSTOURETEROSCOPY, WITH RETROGRADE PYELOGRAM AND STENT INSERTION
Anesthesia: General | Laterality: Right

## 2014-01-31 MED ORDER — PROPOFOL 10 MG/ML IV BOLUS
INTRAVENOUS | Status: AC
  Filled 2014-01-31: qty 20

## 2014-01-31 MED ORDER — FENTANYL CITRATE 0.05 MG/ML IJ SOLN: 25.0000 ug | INTRAMUSCULAR | Status: DC | PRN

## 2014-01-31 MED ORDER — 0.9 % SODIUM CHLORIDE (POUR BTL) OPTIME
TOPICAL | Status: DC | PRN
  Administered 2014-01-31: 1000 mL

## 2014-01-31 MED ORDER — LACTATED RINGERS IV SOLN
INTRAVENOUS | Status: DC
  Administered 2014-01-31: 1000 mL via INTRAVENOUS

## 2014-01-31 MED ORDER — ONDANSETRON HCL 4 MG/2ML IJ SOLN
INTRAMUSCULAR | Status: DC | PRN
  Administered 2014-01-31: 4 mg via INTRAVENOUS

## 2014-01-31 MED ORDER — IOHEXOL 300 MG/ML  SOLN
INTRAMUSCULAR | Status: DC | PRN
  Administered 2014-01-31: 10 mL

## 2014-01-31 MED ORDER — ONDANSETRON HCL 4 MG/2ML IJ SOLN
INTRAMUSCULAR | Status: AC
  Filled 2014-01-31: qty 2

## 2014-01-31 MED ORDER — FENTANYL CITRATE 0.05 MG/ML IJ SOLN
INTRAMUSCULAR | Status: AC
  Filled 2014-01-31: qty 2

## 2014-01-31 MED ORDER — CIPROFLOXACIN HCL 500 MG PO TABS: 500.0000 mg | ORAL_TABLET | Freq: Two times a day (BID) | ORAL | Status: DC

## 2014-01-31 MED ORDER — TRAMADOL HCL 50 MG PO TABS: 50.0000 mg | ORAL_TABLET | Freq: Four times a day (QID) | ORAL | Status: DC | PRN

## 2014-01-31 MED ORDER — FENTANYL CITRATE 0.05 MG/ML IJ SOLN
INTRAMUSCULAR | Status: DC | PRN
  Administered 2014-01-31: 50 ug via INTRAVENOUS

## 2014-01-31 MED ORDER — PROPOFOL 10 MG/ML IV BOLUS
INTRAVENOUS | Status: DC | PRN
  Administered 2014-01-31: 120 mg via INTRAVENOUS

## 2014-01-31 MED ORDER — SENNOSIDES-DOCUSATE SODIUM 8.6-50 MG PO TABS: 1.0000 | ORAL_TABLET | Freq: Two times a day (BID) | ORAL | Status: DC

## 2014-01-31 MED ORDER — STERILE WATER FOR IRRIGATION IR SOLN
Status: DC | PRN
  Administered 2014-01-31: 2000 mL

## 2014-01-31 MED ORDER — SODIUM CHLORIDE 0.9 % IR SOLN
Status: DC | PRN
  Administered 2014-01-31: 4000 mL via INTRAVESICAL

## 2014-01-31 MED ORDER — EPHEDRINE SULFATE 50 MG/ML IJ SOLN
INTRAMUSCULAR | Status: DC | PRN
  Administered 2014-01-31 (×2): 5 mg via INTRAVENOUS

## 2014-01-31 SURGICAL SUPPLY — 26 items
BAG URINE DRAINAGE (UROLOGICAL SUPPLIES) ×1 IMPLANT
BASKET LASER NITINOL 1.9FR (BASKET) IMPLANT
BASKET STNLS GEMINI 4WIRE 3FR (BASKET) IMPLANT
BASKET ZERO TIP NITINOL 2.4FR (BASKET) IMPLANT
BSKT STON RTRVL 120 1.9FR (BASKET)
BSKT STON RTRVL GEM 120X11 3FR (BASKET)
BSKT STON RTRVL ZERO TP 2.4FR (BASKET)
CATH INTERMIT  6FR 70CM (CATHETERS) ×2 IMPLANT
CLOTH BEACON ORANGE TIMEOUT ST (SAFETY) ×2 IMPLANT
DRAPE CAMERA CLOSED 9X96 (DRAPES) ×2 IMPLANT
ELECT REM PT RETURN 9FT ADLT (ELECTROSURGICAL)
ELECTRODE REM PT RTRN 9FT ADLT (ELECTROSURGICAL) IMPLANT
EXTRACTOR STONE NITINOL NGAGE (UROLOGICAL SUPPLIES) ×1 IMPLANT
FIBER LASER FLEXIVA 200 (UROLOGICAL SUPPLIES) ×1 IMPLANT
FIBER LASER FLEXIVA 365 (UROLOGICAL SUPPLIES) IMPLANT
GLOVE BIOGEL M STRL SZ7.5 (GLOVE) ×2 IMPLANT
GOWN STRL REUS W/TWL LRG LVL3 (GOWN DISPOSABLE) ×2 IMPLANT
GUIDEWIRE ANG ZIPWIRE 038X150 (WIRE) ×3 IMPLANT
GUIDEWIRE STR DUAL SENSOR (WIRE) ×2 IMPLANT
IV NS IRRIG 3000ML ARTHROMATIC (IV SOLUTION) ×2 IMPLANT
PACK CYSTO (CUSTOM PROCEDURE TRAY) ×2 IMPLANT
SHEATH ACCESS URETERAL 24CM (SHEATH) ×1 IMPLANT
STENT CONTOUR 7FRX24X.038 (STENTS) ×1 IMPLANT
SYRINGE 10CC LL (SYRINGE) IMPLANT
SYRINGE IRR TOOMEY STRL 70CC (SYRINGE) IMPLANT
TUBE FEEDING 8FR 16IN STR KANG (MISCELLANEOUS) ×1 IMPLANT

## 2014-01-31 NOTE — H&P (Signed)
Carrie Best is an 80 y.o. female.    Chief Complaint: Pre-Op Rt Ureteroscopic Stone Manipulation and Ureteral Biopsy,   HPI:   1 - Rt Ureteral Mass - 2cm Best of questionable soft tissue enhancement at Rt UPJ by CT Urogram 12/2013 on eval gross hematuria.   2 - Rt Ureteral Stone / Hydro - Approx 1cm total right UVJ stone with large proximal hydro by CT Urogram 12/2013. No contralateral stones.   3 - Bilateral Non-Complex Renal Cysts - Incidental Lt>Rt non-complex cysts by CT Urogram 12/2013.   4 - Gross Hematuria - Pt with new gross hematuria prompting eval. CT Urogram with rt stone, ?ureteral mass as per above. No cysto as of yet.  5- Mixed Urinary Incontinence - s/p sling 2008 for stress leakage. Had problems with urge component as well and had piror interstim and explant.  PMH sig for CHF, CVA (Xarelto), HTN. Her PCP is Carrie Chen MD. Her Cardiologist is Carrie Area MD with [facility].  Today Carrie Best is seen to proceed with cysto bilateral retrograde, right ureterosocpic stone manipulation and right ureteroscopic biopsy for hte above. No interval fevers. Most recent UCX non-specific growth and has been on Levaquin prior to today to reduce colonization.   Past Medical History  Diagnosis Date  . Hypertension   . Arthritis   . Diastolic dysfunction   . Hyperlipidemia   . Congestive heart failure     Congestive heart failure symptoms  . Heart murmur   . Dysrhythmia 05-10-12    hx. A. Fib., rate is controlled-tx. Xarelto  . Pneumonia 05-10-12    dx. in July- tx. antibiotics  . Hearing loss 05-10-12    bilateral hearing aids.  Marland Kitchen Dyspnea 05-10-12    recent 2-3 weeks hospital visit 7'13 pneumonia.  Marland Kitchen GERD (gastroesophageal reflux disease) 05-10-12    tx. with meds as needed  . Chronic kidney disease 05-10-12    hx. recent kidney stones passed.  . Depression   . Stroke 05-10-12    Lt.CVA note on scans-hx. freq. falls/ none past 6 months 01/21/14    Past Surgical History  Procedure  Laterality Date  . Appendectomy    . Other surgical history      Bladder Surgery  . Interstim implant placement  05-10-12    now malfunctioning  . Cataract extraction, bilateral  05-10-12    bilateral  . Interstim implant removal  05/16/2012    Procedure: REMOVAL OF INTERSTIM IMPLANT;  Surgeon: Martina Sinner, MD;  Location: [facility];  Service: Urology;  Laterality: N/A;    Family History  Problem Relation Age of Onset  . Stroke Mother   . Stroke Brother    Social History:  reports that she has never smoked. She has never used smokeless tobacco. She reports that she does not drink alcohol or use illicit drugs.  Allergies:  Allergies  Allergen Reactions  . Codeine Nausea And Vomiting  . Penicillins Hives  . Sulfur Nausea And Vomiting    No prescriptions prior to admission    No results found for this or any previous visit (from the past 48 hour(s)). No results found.  Review of Systems  Constitutional: Negative.  Negative for fever and chills.  HENT: Negative.   Eyes: Negative.   Respiratory: Negative.   Cardiovascular: Negative.   Gastrointestinal: Negative.   Genitourinary: Negative.   Musculoskeletal: Negative.   Skin: Negative.   Neurological: Negative.   Endo/Heme/Allergies: Negative.   Psychiatric/Behavioral: Negative.  There were no vitals taken for this visit. Physical Exam   Assessment/Plan  1 - Rt Ureteral Mass - Needs ureteroscopic assessment to see if any intraluminal mass present, and if so, biopsy. Would need to be off Xarelto prior and has been in preparation for today.   2 - Rt Ureteral Stone / Hydro - appears chornic with some parenchyaml thinning, but ceratinly warrants active treatment, prefer ureteroscopy as most definitive and should allow concomitatnt asessment of possible ureteral mass.  Were rediscussed ureteroscopic stone manipulation with basketing and laser-lithotripsy in detail.  We rediscussed risks including bleeding, infection,  damage to kidney / ureter  bladder, rarely loss of kidney. We rediscussed anesthetic risks and rare but serious surgical complications including DVT, PE, MI, and mortality. We specifically readdressed that in 5-10% of cases a staged approach is required with stenting followed by re-attempt ureteroscopy if anatomy unfavorable. The patient voiced understanding and wises to proceed.   3 - Bilateral Non-Complex Renal Cysts - no indication for further evaluation or surveillance.   4 - Gross Hematuria - will perform cysto as part of procedure above to complete eval.    Carrie Best 01/31/2014, 6:02 AM

## 2014-01-31 NOTE — Anesthesia Preprocedure Evaluation (Addendum)
Anesthesia Evaluation  Patient identified by MRN, date of birth, ID band Patient awake  General Assessment Comment: Hypertension     .  Arthritis     .  Diastolic dysfunction     .  Hyperlipidemia     .  Congestive heart failure         Congestive heart failure symptoms   .  Heart murmur     .  Dysrhythmia  05-10-12       hx. A. Fib., rate is controlled-tx. Xarelto   .  Pneumonia  05-10-12       dx. in July- tx. antibiotics   .  Hearing loss  05-10-12       bilateral hearing aids.   Marland Kitchen  Dyspnea  05-10-12       recent 2-3 weeks hospital visit 7'13 pneumonia.   Marland Kitchen  GERD (gastroesophageal reflux disease)  05-10-12       tx. with meds as needed   .  Chronic kidney disease  05-10-12       hx. recent kidney stones passed.   .  Depression     .  Stroke  05-10-12       Lt.CVA note on scans-hx. freq. falls/ none past 6 months 01/21/14        Reviewed: Allergy & Precautions, H&P , NPO status , Patient's Chart, lab work & pertinent test results  Airway Mallampati: II TM Distance: >3 FB Neck ROM: Full    Dental no notable dental hx.    Pulmonary shortness of breath and with exertion, pneumonia -, resolved,  breath sounds clear to auscultation  Pulmonary exam normal       Cardiovascular hypertension, Pt. on medications and Pt. on home beta blockers +CHF + dysrhythmias Atrial Fibrillation + Valvular Problems/Murmurs Rhythm:Regular Rate:Normal  Reviewed Dr. Elease Hashimoto cardiology office visit of 01-16-14  No increase in cardiopulmonary symptoms. Chronic stable DOE.   Neuro/Psych PSYCHIATRIC DISORDERS Depression CVA, Residual Symptoms    GI/Hepatic Neg liver ROS, GERD-  Medicated,  Endo/Other  negative endocrine ROS  Renal/GU Renal InsufficiencyRenal disease  negative genitourinary   Musculoskeletal negative musculoskeletal ROS (+)   Abdominal   Peds negative pediatric ROS (+)  Hematology negative hematology ROS (+)   Anesthesia Other  Findings   Reproductive/Obstetrics negative OB ROS                          Anesthesia Physical Anesthesia Plan  ASA: III  Anesthesia Plan: General   Post-op Pain Management:    Induction: Intravenous  Airway Management Planned: LMA  Additional Equipment:   Intra-op Plan:   Post-operative Plan: Extubation in OR  Informed Consent: I have reviewed the patients History and Physical, chart, labs and discussed the procedure including the risks, benefits and alternatives for the proposed anesthesia with the patient or authorized representative who has indicated his/her understanding and acceptance.   Dental advisory given  Plan Discussed with: CRNA  Anesthesia Plan Comments:         Anesthesia Quick Evaluation

## 2014-01-31 NOTE — Progress Notes (Signed)
Dr Manny reports pt does NOT need to void prior to discharge 

## 2014-01-31 NOTE — Anesthesia Postprocedure Evaluation (Signed)
  Anesthesia Post-op Note  Patient: Carrie Best  Procedure(s) Performed: Procedure(s) (LRB): CYSTOSCOPY WITH bilateral  RETROGRADE PYELOGRAM, right URETEROSCOPY AND right  STENT PLACEMENT, bladder biopsy with fulgeration (Right) HOLMIUM LASER APPLICATION (Right)  Patient Location: PACU  Anesthesia Type: General  Level of Consciousness: awake and alert   Airway and Oxygen Therapy: Patient Spontanous Breathing  Post-op Pain: mild  Post-op Assessment: Post-op Vital signs reviewed, Patient's Cardiovascular Status Stable, Respiratory Function Stable, Patent Airway and No signs of Nausea or vomiting  Last Vitals:  Filed Vitals:   01/31/14 1342  BP: 114/56  Pulse: 62  Temp: 36.4 C  Resp: 14    Post-op Vital Signs: stable   Complications: No apparent anesthesia complications

## 2014-01-31 NOTE — Discharge Instructions (Signed)
1 - You may have urinary urgency (bladder spasms) and bloody urine on / off with stent in place. This is normal. ° °2 - Call MD or go to ER for fever >102, severe pain / nausea / vomiting not relieved by medications, or acute change in medical status ° °

## 2014-01-31 NOTE — Brief Op Note (Signed)
01/31/2014  12:16 PM  PATIENT:  Carrie Best  80 y.o. female  PRE-OPERATIVE DIAGNOSIS:  RIGHT URETERAL STONE, ? RIGHT URETERAL MASS, HEMATURIA  POST-OPERATIVE DIAGNOSIS:  Right ureteral stone and small blader mass  PROCEDURE:  Procedure(s) with comments: CYSTOSCOPY WITH bilateral  RETROGRADE PYELOGRAM, right URETEROSCOPY AND right  STENT PLACEMENT, bladder biopsy with fulgeration (Right) - 90 MIN NEEDS DIGITAL URETEROSCOPE [employer] MEDICARE-[phone]   CALL DAUGHTER (POA) SUSAN SWINSON [phone] HOLMIUM LASER APPLICATION (Right)  SURGEON:  Surgeon(s) and Role:    * Sebastian Ache, MD - Primary  PHYSICIAN ASSISTANT:   ASSISTANTS: none   ANESTHESIA:   general  EBL:     BLOOD ADMINISTERED:none  DRAINS: none   LOCAL MEDICATIONS USED:  NONE  SPECIMEN:  Source of Specimen:  small bladder lesions Rt wall, ureteral stone fragements  DISPOSITION OF SPECIMEN:  PATHOLOGY  COUNTS:  YES  TOURNIQUET:  * No tourniquets in log *  DICTATION: .Other Dictation: Dictation Number [id number]  PLAN OF CARE: Discharge to home after PACU  PATIENT DISPOSITION:  PACU - hemodynamically stable.   Delay start of Pharmacological VTE agent (>24hrs) due to surgical blood loss or risk of bleeding: not applicable

## 2014-01-31 NOTE — Transfer of Care (Signed)
Immediate Anesthesia Transfer of Care Note  Patient: Carrie Best  Procedure(s) Performed: Procedure(s) (LRB): CYSTOSCOPY WITH bilateral  RETROGRADE PYELOGRAM, right URETEROSCOPY AND right  STENT PLACEMENT, bladder biopsy with fulgeration (Right) HOLMIUM LASER APPLICATION (Right)  Patient Location: PACU  Anesthesia Type: General  Level of Consciousness: sedated, patient cooperative and responds to stimulation  Airway & Oxygen Therapy: Patient Spontanous Breathing and Patient connected to face mask oxgen  Post-op Assessment: Report given to PACU RN and Post -op Vital signs reviewed and stable  Post vital signs: Reviewed and stable  Complications: No apparent anesthesia complications

## 2014-02-01 ENCOUNTER — Encounter (HOSPITAL_COMMUNITY): Payer: Self-pay | Admitting: Urology

## 2014-02-01 NOTE — Op Note (Signed)
Carrie Best:  Silveri, Vickki              ACCOUNT NO.:  [id number]  MEDICAL RECORD NO.:  [id number]  LOCATION:                                 FACILITY:  PHYSICIAN:  Sebastian Acheheodore Kristy Schomburg, MD     DATE OF BIRTH:  March 16, 1928  DATE OF PROCEDURE:  01/31/2014 DATE OF DISCHARGE:                              OPERATIVE REPORT   DIAGNOSES:  Questionable right ureteral mass, right distal ureteral stone, hydronephrosis, hematuria.  PROCEDURE: 1. Cystoscopy, bilateral retrograde pyelogram interpretation. 2. Right ureteroscopy with laser lithotripsy. 3. Right ureteral stent placement, 7 x 24, no tether. 4. Bladder biopsy with fulguration.  FINDINGS: 1. Small bladder lesions in the right bladder wall, likely consistent     with cystitis cystica.  These were biopsied. 2. Unremarkable left retrograde pyelogram. 3. Severely impacted right distal ureteral stone with proximal severe     hydroureteronephrosis and ureteral folding and redundancy.  No     focal ureteral masses seen.  ESTIMATED BLOOD LOSS:  Nil.  SPECIMENS: 1. Right distal ureteral stone for compositional analysis. 2. Bladder biopsy for permanent pathology.  DRAINS:  None.  INDICATION:  [name] is a pleasant 80 year old lady who on workup of gross hematuria was found to have a likely impacted right distal ureteral stone with proximal hydroureteronephrosis and a questionable right ureteral mass.  Options were discussed including observation versus percutaneous approach surgery versus retrograde approach surgery for diagnostic and therapeutic purposes, and she wished to proceed with the latter.  Informed consent was obtained and placed in medical record.  PROCEDURE IN DETAIL:  The patient being [name] verified. Procedure being cysto bilateral retrograde, right ureteroscopic stone manipulation with possible biopsy was confirmed.  Procedure was carried out.  Time-out was performed.  Intravenous antibiotics were administered.   General anesthesia was introduced.  The patient was placed into a low lithotomy position.  Sterile field was created by prepping and draping the patient's vagina, introitus, and proximal thighs using iodine x3.  Next, cystourethroscopy performed using a 22- French rigid cystoscope with 12-degree offset lens.  Inspection of urinary bladder revealed no diverticula, calcifications.  Ureteral orifices were in the normal anatomic position.  There were some small bladder lesions in the right wall, most likely consistent with cystitis cystica; however, there was some erythema on the basis of these, so I felt that biopsy would be warranted.  Attention was then directed at the bilateral retrograde pyelograms first on the left side.  The left ureteral orifice was cannulated with a 6-French Foley catheter and left retrograde pyelogram was obtained.  Left retrograde pyelogram showed a single left ureter, single system left kidney.  No filling defects or narrowing noted.  Attention was then directed to the right side.  Right retrograde pyelogram demonstrated a single right ureter with large filling defect in the distal ureter with complete obstruction without flow of contrast above this consistent likely with impacted distal ureteral stone.  Multiple attempts were then made using angle-tipped zip wire as well as straight tip Sensor wire to pass safety wire above this level; however, this was unsuccessful, so the direct visualization of ureteroscopy be the safest way to proceed.  An 8-French feeding tube was  placed in urinary bladder for pressure release.  Next, semi-rigid ureteroscopy was performed of the distal right ureter using normal saline irrigation under pressure.  As expected there was completely impacted right distal ureteral stone with significant mucosal overgrowth in this area.  There was no obvious lumen visible next to the stone and it was felt that the most prudent way would be to  proceed with holmium laser lithotripsy in a very careful fashion ablating the central part of the stone, try to fragment it such that true lumen would become visible.  As such holmium laser energy was applied to the stone using settings of 0.5 joules and 5 Hertz, and is very carefully fragmented on its leading edge from  and after ablation of several fragments, this stone decreased in size such that true lumen past the stone was clearly seen. There was no evidence of ureteral perforation at this site.  A 0.038 zip wire was then advanced at the level of the renal pelvis, set aside as a safety wire.  Next, additional laser lithotripsy was perfomred fragmentin the stone into pieces 4 mm or less in diameter.  These were then sequentially grasped with an Escape basket and brought out in their entirety, set aside for compositional analysis.  Inspection of the distal 2/3rd right ureter revealed some redundancy but no ureteral masses.  Next, the semi- rigid ureteroscope was exchanged for a 12/14, 24 cm ureteral access sheath to the level of the mid ureter alongside a separate Sensor working wire and flexible digital ureteroscopy performed in the proximal right ureter.  There was some ureteral redundancy noted more so at the area of the right UPJ.  There were no obvious intraluminal masses.  This was felt that this redundancy likely explain the possible mass on recent imaging.  Very careful inspection was performed of the right kidney including the renal pelvis and all calyces x2.  No mucosal abnormalities or calcifications were seen.  Again, the area of UPJ was carefully inspected and no mass was seen.  Photodocumentation was performed to this.  The sheath was removed under continuous ureteroscopic vision and no mucosal abnormalities were found.  The area previously impacted stone was also photographed.  Next a 7 x 24- French Contour stent was placed over the remaining safety wire  using cystoscopic and fluoroscopic guidance.  Good proximal and distal curl were noted.  A 7-French size was chosen given the significant impaction of the distal ureter.  Attention was then bladder biopsy switching irrigation to sterile water.  Cold cup biopsies were taken x2 at these areas in the right hemi-bladder.  These were set aside for permanent pathology.  Bovie electrode was used to fulgurate the base of these.  Following these maneuvers, hemostasis appeared excellent.  There was no visual evidence of bladder perforation or disruption of the right ureteral orifice.  Bladder was emptied per cystoscope.  Procedure was then terminated.  The patient tolerated the procedure well.  There were no immediate periprocedural complications. The patient was taken to postanesthesia care unit in stable condition.          ______________________________ Sebastian Ache, MD     TM/MEDQ  D:  01/31/2014  T:  02/01/2014  Job:  [id number]

## 2014-02-28 ENCOUNTER — Other Ambulatory Visit: Payer: Self-pay | Admitting: Cardiovascular Disease

## 2014-02-28 ENCOUNTER — Telehealth: Payer: Self-pay | Admitting: Cardiovascular Disease

## 2014-02-28 NOTE — Telephone Encounter (Signed)
Notified patient's daughter of Dr. Harvie BridgeNahser's advisement re: Lasix 40 mg by mouth twice daily x three (3) days and if not improved, she should contact her medical doctor or go to urgent care to have labs/CXR for SOB. Daughter is the one that provides the medications to her mom/patient and cares for her medical needs. Understanding and agreement verbalized by daughter. She states they will let Dr. Elease HashimotoNahser know if this increased Lasix dose helps the problem.

## 2014-02-28 NOTE — Telephone Encounter (Signed)
Patient called with c/o recent worsening of usual dyspnea/SOB (over past month). She states even standing on scale is too much for her and she can barely walk due to SOB. She states she has small cough. Most recent weight a few days ago was 195.6 which does not reflect weight gain since her May visit/surgery. She states her LE swelling is "same as usual" and not worsened. Stated that her urinary output is "normal". States she takes medications from her pill dispenser but unsure what she takes "since my daughter does that for me".  She was seen in May 2015 OV by Dr. Elease HashimotoNahser. Patient stated she was unable to walk to her daughter's room to wake her up so that this nurse could check on medications that patient takes daily. Patient states nurse could call back repeatedly to let phone ring so that patient's daughter would get up to answer it. Attempted this several times and finally patient answered. She proceeded to go wake up her daughter. Patient's SOB improved after she had been sitting down and not talking for a few minutes. Daughter came to phone and spoke to nurse. States she was unaware that patient had called the office today or had been having worsening problems. Daughter did say that patient had surgery last month for kidney stones and that a physician told her that it would take her six months to a year to completely recover due to her health issues. Daughter verified patient's medications and stated that she did take Lasix 40 mg every morning. Routed to Dr. Elease HashimotoNahser for advisement.

## 2014-02-28 NOTE — Telephone Encounter (Signed)
Please have her take lasix BID for the next 3 days to see if this helps. She may need to see her medical doctor or go to urgent care to have labs and CXR if she is really short of breath.

## 2014-02-28 NOTE — Telephone Encounter (Signed)
New problem    Pt is having SOB at present.

## 2014-05-31 ENCOUNTER — Emergency Department (HOSPITAL_COMMUNITY): Payer: Medicare Other

## 2014-05-31 ENCOUNTER — Emergency Department (HOSPITAL_COMMUNITY)
Admission: EM | Admit: 2014-05-31 | Discharge: 2014-06-01 | Disposition: A | Discharge status: Home / Self Care | Payer: Medicare Other | Attending: Emergency Medicine | Admitting: Emergency Medicine

## 2014-05-31 ENCOUNTER — Encounter (HOSPITAL_COMMUNITY): Payer: Self-pay | Admitting: Emergency Medicine

## 2014-05-31 DIAGNOSIS — Z880 Allergy status to penicillin: Secondary | ICD-10-CM | POA: Diagnosis not present

## 2014-05-31 DIAGNOSIS — S46909A Unspecified injury of unspecified muscle, fascia and tendon at shoulder and upper arm level, unspecified arm, initial encounter: Secondary | ICD-10-CM | POA: Diagnosis present

## 2014-05-31 DIAGNOSIS — Z79899 Other long term (current) drug therapy: Secondary | ICD-10-CM | POA: Insufficient documentation

## 2014-05-31 DIAGNOSIS — F3289 Other specified depressive episodes: Secondary | ICD-10-CM | POA: Diagnosis not present

## 2014-05-31 DIAGNOSIS — Z8701 Personal history of pneumonia (recurrent): Secondary | ICD-10-CM | POA: Diagnosis not present

## 2014-05-31 DIAGNOSIS — R011 Cardiac murmur, unspecified: Secondary | ICD-10-CM | POA: Insufficient documentation

## 2014-05-31 DIAGNOSIS — S42213A Unspecified displaced fracture of surgical neck of unspecified humerus, initial encounter for closed fracture: Secondary | ICD-10-CM | POA: Diagnosis not present

## 2014-05-31 DIAGNOSIS — Y9301 Activity, walking, marching and hiking: Secondary | ICD-10-CM | POA: Insufficient documentation

## 2014-05-31 DIAGNOSIS — N189 Chronic kidney disease, unspecified: Secondary | ICD-10-CM | POA: Diagnosis not present

## 2014-05-31 DIAGNOSIS — F329 Major depressive disorder, single episode, unspecified: Secondary | ICD-10-CM | POA: Diagnosis not present

## 2014-05-31 DIAGNOSIS — I129 Hypertensive chronic kidney disease with stage 1 through stage 4 chronic kidney disease, or unspecified chronic kidney disease: Secondary | ICD-10-CM | POA: Insufficient documentation

## 2014-05-31 DIAGNOSIS — W010XXA Fall on same level from slipping, tripping and stumbling without subsequent striking against object, initial encounter: Secondary | ICD-10-CM | POA: Diagnosis not present

## 2014-05-31 DIAGNOSIS — H9190 Unspecified hearing loss, unspecified ear: Secondary | ICD-10-CM | POA: Insufficient documentation

## 2014-05-31 DIAGNOSIS — S4980XA Other specified injuries of shoulder and upper arm, unspecified arm, initial encounter: Secondary | ICD-10-CM | POA: Diagnosis present

## 2014-05-31 DIAGNOSIS — S40019A Contusion of unspecified shoulder, initial encounter: Secondary | ICD-10-CM | POA: Diagnosis not present

## 2014-05-31 DIAGNOSIS — Z7901 Long term (current) use of anticoagulants: Secondary | ICD-10-CM | POA: Diagnosis not present

## 2014-05-31 DIAGNOSIS — K219 Gastro-esophageal reflux disease without esophagitis: Secondary | ICD-10-CM | POA: Insufficient documentation

## 2014-05-31 DIAGNOSIS — S42301A Unspecified fracture of shaft of humerus, right arm, initial encounter for closed fracture: Secondary | ICD-10-CM

## 2014-05-31 DIAGNOSIS — Y9289 Other specified places as the place of occurrence of the external cause: Secondary | ICD-10-CM | POA: Diagnosis not present

## 2014-05-31 DIAGNOSIS — E785 Hyperlipidemia, unspecified: Secondary | ICD-10-CM | POA: Insufficient documentation

## 2014-05-31 DIAGNOSIS — I509 Heart failure, unspecified: Secondary | ICD-10-CM | POA: Diagnosis not present

## 2014-05-31 DIAGNOSIS — M129 Arthropathy, unspecified: Secondary | ICD-10-CM | POA: Diagnosis not present

## 2014-05-31 DIAGNOSIS — Z8673 Personal history of transient ischemic attack (TIA), and cerebral infarction without residual deficits: Secondary | ICD-10-CM | POA: Insufficient documentation

## 2014-05-31 MED ORDER — HYDROCODONE-ACETAMINOPHEN 5-325 MG PO TABS: 2.0000 | ORAL_TABLET | ORAL | Status: DC | PRN

## 2014-05-31 MED ORDER — OXYCODONE-ACETAMINOPHEN 5-325 MG PO TABS
1.0000 | ORAL_TABLET | Freq: Once | ORAL | Status: AC
  Administered 2014-05-31: 1 via ORAL
  Filled 2014-05-31: qty 1

## 2014-05-31 NOTE — ED Notes (Signed)
Dr. Wentz at bedside. 

## 2014-05-31 NOTE — Discharge Instructions (Signed)
Wear the sling, except when bathing or changing clothing. Use ice on the sore area 6-8 times a day for 30 minutes. Followup with the orthopedic doctor as soon as possible.    Humerus Fracture, Treated with Immobilization The humerus is the large bone in your upper arm. You have a broken (fractured) humerus. These fractures are easily diagnosed with X-rays. TREATMENT  Simple fractures which will heal without disability are treated with simple immobilization. Immobilization means you will wear a cast, splint, or sling. You have a fracture which will do well with immobilization. The fracture will heal well simply by being held in a good position until it is stable enough to begin range of motion exercises. Do not take part in activities which would further injure your arm.  HOME CARE INSTRUCTIONS   Put ice on the injured area.  Put ice in a plastic bag.  Place a towel between your skin and the bag.  Leave the ice on for 15-20 minutes, 03-04 times a day.  If you have a cast:  Do not scratch the skin under the cast using sharp or pointed objects.  Check the skin around the cast every day. You may put lotion on any red or sore areas.  Keep your cast dry and clean.  If you have a splint:  Wear the splint as directed.  Keep your splint dry and clean.  You may loosen the elastic around the splint if your fingers become numb, tingle, or turn cold or blue.  If you have a sling:  Wear the sling as directed.  Do not put pressure on any part of your cast or splint until it is fully hardened.  Your cast or splint can be protected during bathing with a plastic bag. Do not lower the cast or splint into water.  Only take over-the-counter or prescription medicines for pain, discomfort, or fever as directed by your caregiver.  Do range of motion exercises as instructed by your caregiver.  Follow up as directed by your caregiver. This is very important in order to avoid permanent injury or  disability and chronic pain. SEEK IMMEDIATE MEDICAL CARE IF:   Your skin or nails in the injured arm turn blue or gray.  Your arm feels cold or numb.  You develop severe pain in the injured arm.  You are having problems with the medicines you were given. MAKE SURE YOU:   Understand these instructions.  Will watch your condition.  Will get help right away if you are not doing well or get worse. Document Released: 11/29/2000 Document Revised: 11/15/2011 Document Reviewed: 10/07/2010 [facility] Patient Information 2015 [place], [place]. This information is not intended to replace advice given to you by your health care provider. Make sure you discuss any questions you have with your health care provider.

## 2014-05-31 NOTE — ED Notes (Addendum)
Pt to ED via [facility] c/o R shoulder pain; obvious deformity and bruising noted. Pt ws attempted to walk around daughter when she fell on R shoulder. -LOC. Fentanyl and  Zofran given enroute. Pt is on blood thinners

## 2014-05-31 NOTE — ED Notes (Addendum)
Ortho tech at bedside 

## 2014-06-01 NOTE — ED Provider Notes (Signed)
CSN: [id number]     Arrival date & time 05/31/14  2128 History   First MD Initiated Contact with Patient 05/31/14 2132     Chief Complaint  Patient presents with  . Fall  . Shoulder Pain     (Consider location/radiation/quality/duration/timing/severity/associated sxs/prior Treatment) Patient is a 80 y.o. female presenting with fall and shoulder pain. The history is provided by the patient.  Fall  Shoulder Pain   She was walking, in a narrow hallway, with her daughter, trying to maneuver around something when she stumbled and fell. She was able to get up because of pain in her right shoulder. She was treated and transferred by EMS. She denies head, neck, or back injury. There were no other preceding symptoms. She denies recent illnesses. There are no other known modifying factors.  Past Medical History  Diagnosis Date  . Hypertension   . Arthritis   . Diastolic dysfunction   . Hyperlipidemia   . Congestive heart failure     Congestive heart failure symptoms  . Heart murmur   . Dysrhythmia 05-10-12    hx. A. Fib., rate is controlled-tx. Xarelto  . Pneumonia 05-10-12    dx. in July- tx. antibiotics  . Hearing loss 05-10-12    bilateral hearing aids.  Marland Kitchen Dyspnea 05-10-12    recent 2-3 weeks hospital visit 7'13 pneumonia.  Marland Kitchen GERD (gastroesophageal reflux disease) 05-10-12    tx. with meds as needed  . Chronic kidney disease 05-10-12    hx. recent kidney stones passed.  . Depression   . Stroke 05-10-12    Lt.CVA note on scans-hx. freq. falls/ none past 6 months 01/21/14   Past Surgical History  Procedure Laterality Date  . Appendectomy    . Other surgical history      Bladder Surgery  . Interstim implant placement  05-10-12    now malfunctioning  . Cataract extraction, bilateral  05-10-12    bilateral  . Interstim implant removal  05/16/2012    Procedure: REMOVAL OF INTERSTIM IMPLANT;  Surgeon: Martina Sinner, MD;  Location: [facility];  Service: Urology;  Laterality: N/A;  . Cystoscopy  with retrograde pyelogram, ureteroscopy and stent placement Right 01/31/2014    Procedure: CYSTOSCOPY WITH bilateral  RETROGRADE PYELOGRAM, right URETEROSCOPY AND right  STENT PLACEMENT, bladder biopsy with fulgeration;  Surgeon: Sebastian Ache, MD;  Location: [facility];  Service: Urology;  Laterality: Right;  . Holmium laser application Right 01/31/2014    Procedure: HOLMIUM LASER APPLICATION;  Surgeon: Sebastian Ache, MD;  Location: [facility];  Service: Urology;  Laterality: Right;   Family History  Problem Relation Age of Onset  . Stroke Mother   . Stroke Brother    History  Substance Use Topics  . Smoking status: Never Smoker   . Smokeless tobacco: Never Used  . Alcohol Use: No   OB History   Grav Para Term Preterm Abortions TAB SAB Ect Mult Living                 Review of Systems  All other systems reviewed and are negative.     Allergies  Codeine; Penicillins; and Sulfur  Home Medications   Prior to Admission medications   Medication Sig Start Date End Date Taking? Authorizing Provider  acetaminophen (TYLENOL ARTHRITIS PAIN) 650 MG CR tablet Take 650 mg by mouth every 8 (eight) hours as needed. For pain   Yes Historical Provider, MD  atorvastatin (LIPITOR) 20 MG tablet Take 1 tablet (20 mg total) by  mouth at bedtime. 03/23/12 05/31/14 Yes Lillia Mountain, MD  diltiazem (DILACOR XR) 180 MG 24 hr capsule Take 180 mg by mouth every morning.   Yes Historical Provider, MD  DULoxetine (CYMBALTA) 60 MG capsule Take 60 mg by mouth every morning.    Yes Historical Provider, MD  fish oil-omega-3 fatty acids 1000 MG capsule Take 1 g by mouth daily.   Yes Historical Provider, MD  furosemide (LASIX) 40 MG tablet Take 40 mg by mouth daily.   Yes Historical Provider, MD  LORazepam (ATIVAN) 0.5 MG tablet Take 0.5 mg by mouth every 8 (eight) hours as needed for anxiety.   Yes Historical Provider, MD  metoprolol tartrate (LOPRESSOR) 25 MG tablet Take 50 mg by mouth 2 (two) times daily.   03/23/12  Yes Lillia Mountain, MD  ondansetron (ZOFRAN-ODT) 8 MG disintegrating tablet Take 1 tablet (8 mg total) by mouth every 12 (twelve) hours as needed for nausea. 09/05/12  Yes Gavin Pound. Ghim, MD  potassium chloride SA (K-DUR,KLOR-CON) 20 MEQ tablet Take 1 tablet (20 mEq total) by mouth daily. 03/23/12 05/31/14 Yes Lillia Mountain, MD  ranitidine (ZANTAC) 150 MG capsule Take 150 mg by mouth daily as needed. Reflux   Yes Historical Provider, MD  Rivaroxaban (XARELTO) 20 MG TABS Take 20 mg by mouth every morning.    Yes Historical Provider, MD  senna-docusate (SENOKOT-S) 8.6-50 MG per tablet Take 1 tablet by mouth 2 (two) times daily. While taking pain meds to prevent constipation 01/31/14  Yes Sebastian Ache, MD  traMADol (ULTRAM) 50 MG tablet Take 1 tablet (50 mg total) by mouth every 6 (six) hours as needed for moderate pain. Post-operatively 01/31/14  Yes Sebastian Ache, MD  zolpidem (AMBIEN) 10 MG tablet Take 10 mg by mouth at bedtime as needed. For sleep   Yes Historical Provider, MD  HYDROcodone-acetaminophen (NORCO) 5-325 MG per tablet Take 2 tablets by mouth every 4 (four) hours as needed. 05/31/14   Flint Melter, MD   BP 121/58  Pulse 78  Temp(Src) 98 F (36.7 C) (Oral)  Resp 16  SpO2 97% Physical Exam  Nursing note and vitals reviewed. Constitutional: She is oriented to person, place, and time. She appears well-developed and well-nourished.  HENT:  Head: Normocephalic and atraumatic.  Eyes: Conjunctivae and EOM are normal. Pupils are equal, round, and reactive to light.  Neck: Normal range of motion and phonation normal. Neck supple.  Cardiovascular: Normal rate and regular rhythm.   Pulmonary/Chest: Effort normal and breath sounds normal. She exhibits no tenderness.  Abdominal: Soft. She exhibits no distension. There is no tenderness. There is no guarding.  Musculoskeletal: Normal range of motion.  Right shoulder is tender and swollen with ecchymosis coming  anteriorly. There is no palpable subacromial defect. She is neurovascular intact distally in the right hand.  Neurological: She is alert and oriented to person, place, and time. She exhibits normal muscle tone.  Skin: Skin is warm and dry.  Psychiatric: She has a normal mood and affect. Her behavior is normal. Judgment and thought content normal.    ED Course  Procedures (including critical care time) Medications  oxyCODONE-acetaminophen (PERCOCET/ROXICET) 5-325 MG per tablet 1 tablet (1 tablet Oral Given 05/31/14 2301)    Patient Vitals for the past 24 hrs:  BP Temp Temp src Pulse Resp SpO2  05/31/14 2347 - 98 F (36.7 C) Oral - 16 -  05/31/14 2340 121/58 mmHg - - 78 16 97 %  05/31/14 2259 126/80 mmHg - -  75 16 97 %  05/31/14 2135 107/72 mmHg 97.8 F (36.6 C) Oral 77 16 94 %    At discharge- Reevaluation with update and discussion. After initial assessment and treatment, an updated evaluation reveals . She feels better in shoulder immobilizer. Findings discussed with patient, and daughter. All questions answered.Mancel Bale L    Labs Review Labs Reviewed - No data to display  Imaging Review Dg Shoulder Right  05/31/2014   CLINICAL DATA:  Right shoulder pain, limited range of motion  EXAM: RIGHT SHOULDER - 2+ VIEW  COMPARISON:  None.  FINDINGS: There is a displaced fracture of the surgical neck of the right proximal humerus. There is no dislocation. The acromioclavicular joint is congruent.  IMPRESSION: Displaced fracture of the surgical neck of the right proximal humerus.   Electronically Signed   By: Elige Ko   On: 05/31/2014 22:37     EKG Interpretation None      MDM   Final diagnoses:  Humerus fracture, right, closed, initial encounter    Nursing Notes Reviewed/ Care Coordinated Applicable Imaging Reviewed Interpretation of Laboratory Data incorporated into ED treatment  The patient appears reasonably screened and/or stabilized for discharge and I doubt any  other medical condition or other [facility] requiring further screening, evaluation, or treatment in the ED at this time prior to discharge.  Plan: Home Medications- Norco; Home Treatments- Shoulder Immobilizer as much as possible; return here if the recommended treatment, does not improve the symptoms; Recommended follow up- Ortho f/u 3-4 days    Flint Melter, MD 06/01/14 0006

## 2014-06-07 ENCOUNTER — Telehealth: Payer: Self-pay | Admitting: *Deleted

## 2014-06-07 ENCOUNTER — Encounter (HOSPITAL_COMMUNITY): Payer: Self-pay | Admitting: *Deleted

## 2014-06-07 ENCOUNTER — Other Ambulatory Visit: Payer: Self-pay | Admitting: Orthopedic Surgery

## 2014-06-07 NOTE — Progress Notes (Signed)
Pt's daughter, Darl PikesSusan verified allergies, medical/surgical history and medications for pre-op call, she was given pre-op instructions.  Pt was sleeping.

## 2014-06-07 NOTE — Telephone Encounter (Signed)
Received a surgical clearance on patient and request for Xarelto protocal.  Patient was seen today by ortho and Xarelto has already been held.  Discussed with Dr Elease HashimotoNahser, okay to proceed with surgery.  Patient is low-moderate risk.  Okay to hold Xarelto and resume at the discretion of the surgeon.  I have [facility] a message for [facility] Smith,CMA surgical coordinator

## 2014-06-07 NOTE — Progress Notes (Signed)
Pre-op orders not signed in EPIC. Called Dr. Veda Canninghandler's office and left message on Carla's voicemail requesting orders to be signed.

## 2014-06-09 MED ORDER — CLINDAMYCIN PHOSPHATE 900 MG/50ML IV SOLN
900.0000 mg | INTRAVENOUS | Status: AC
  Administered 2014-06-10: 900 mg via INTRAVENOUS
  Filled 2014-06-09: qty 50

## 2014-06-09 MED ORDER — POVIDONE-IODINE 7.5 % EX SOLN
Freq: Once | CUTANEOUS | Status: DC
  Filled 2014-06-09: qty 118

## 2014-06-10 ENCOUNTER — Observation Stay (HOSPITAL_COMMUNITY)
Admission: RE | Admit: 2014-06-10 | Discharge: 2014-06-13 | Disposition: A | Discharge status: Home / Self Care | Payer: Medicare Other | Source: Ambulatory Visit | Attending: Orthopedic Surgery | Admitting: Orthopedic Surgery

## 2014-06-10 ENCOUNTER — Encounter (HOSPITAL_COMMUNITY): Payer: Self-pay | Admitting: Surgery

## 2014-06-10 ENCOUNTER — Encounter (HOSPITAL_COMMUNITY): Payer: Medicare Other | Admitting: Anesthesiology

## 2014-06-10 ENCOUNTER — Inpatient Hospital Stay (HOSPITAL_COMMUNITY): Payer: Medicare Other

## 2014-06-10 ENCOUNTER — Encounter (HOSPITAL_COMMUNITY)
Admission: RE | Disposition: A | Discharge status: Home / Self Care | Payer: Self-pay | Source: Ambulatory Visit | Attending: Orthopedic Surgery

## 2014-06-10 ENCOUNTER — Inpatient Hospital Stay (HOSPITAL_COMMUNITY): Payer: Medicare Other | Admitting: Anesthesiology

## 2014-06-10 DIAGNOSIS — Z882 Allergy status to sulfonamides status: Secondary | ICD-10-CM | POA: Diagnosis not present

## 2014-06-10 DIAGNOSIS — F329 Major depressive disorder, single episode, unspecified: Secondary | ICD-10-CM | POA: Insufficient documentation

## 2014-06-10 DIAGNOSIS — Z880 Allergy status to penicillin: Secondary | ICD-10-CM | POA: Insufficient documentation

## 2014-06-10 DIAGNOSIS — Z79899 Other long term (current) drug therapy: Secondary | ICD-10-CM | POA: Insufficient documentation

## 2014-06-10 DIAGNOSIS — Z885 Allergy status to narcotic agent status: Secondary | ICD-10-CM | POA: Insufficient documentation

## 2014-06-10 DIAGNOSIS — K219 Gastro-esophageal reflux disease without esophagitis: Secondary | ICD-10-CM | POA: Insufficient documentation

## 2014-06-10 DIAGNOSIS — I509 Heart failure, unspecified: Secondary | ICD-10-CM | POA: Insufficient documentation

## 2014-06-10 DIAGNOSIS — W19XXXA Unspecified fall, initial encounter: Secondary | ICD-10-CM | POA: Insufficient documentation

## 2014-06-10 DIAGNOSIS — S42201D Unspecified fracture of upper end of right humerus, subsequent encounter for fracture with routine healing: Secondary | ICD-10-CM

## 2014-06-10 DIAGNOSIS — Y929 Unspecified place or not applicable: Secondary | ICD-10-CM | POA: Insufficient documentation

## 2014-06-10 DIAGNOSIS — S42209A Unspecified fracture of upper end of unspecified humerus, initial encounter for closed fracture: Secondary | ICD-10-CM | POA: Diagnosis present

## 2014-06-10 DIAGNOSIS — E785 Hyperlipidemia, unspecified: Secondary | ICD-10-CM | POA: Insufficient documentation

## 2014-06-10 DIAGNOSIS — I69351 Hemiplegia and hemiparesis following cerebral infarction affecting right dominant side: Secondary | ICD-10-CM | POA: Diagnosis not present

## 2014-06-10 DIAGNOSIS — M1990 Unspecified osteoarthritis, unspecified site: Secondary | ICD-10-CM | POA: Insufficient documentation

## 2014-06-10 DIAGNOSIS — I10 Essential (primary) hypertension: Secondary | ICD-10-CM | POA: Insufficient documentation

## 2014-06-10 DIAGNOSIS — S42201A Unspecified fracture of upper end of right humerus, initial encounter for closed fracture: Secondary | ICD-10-CM | POA: Diagnosis present

## 2014-06-10 HISTORY — PX: HUMERUS IM NAIL: SHX1769

## 2014-06-10 LAB — CBC
HCT: 28.5 % — ABNORMAL LOW (ref 36.0–46.0)
Hemoglobin: 9.1 g/dL — ABNORMAL LOW (ref 12.0–15.0)
MCH: 27.7 pg (ref 26.0–34.0)
MCHC: 31.9 g/dL (ref 30.0–36.0)
MCV: 86.6 fL (ref 78.0–100.0)
PLATELETS: 270 10*3/uL (ref 150–400)
RBC: 3.29 MIL/uL — ABNORMAL LOW (ref 3.87–5.11)
RDW: 17.5 % — AB (ref 11.5–15.5)
WBC: 8.7 10*3/uL (ref 4.0–10.5)

## 2014-06-10 LAB — BASIC METABOLIC PANEL
Anion gap: 11 (ref 5–15)
BUN: 14 mg/dL (ref 6–23)
CALCIUM: 9.4 mg/dL (ref 8.4–10.5)
CO2: 26 mEq/L (ref 19–32)
Chloride: 99 mEq/L (ref 96–112)
Creatinine, Ser: 0.98 mg/dL (ref 0.50–1.10)
GFR, EST AFRICAN AMERICAN: 60 mL/min — AB (ref >90)
GFR, EST NON AFRICAN AMERICAN: 52 mL/min — AB (ref >90)
GLUCOSE: 134 mg/dL — AB (ref 70–99)
Potassium: 3.7 mEq/L (ref 3.7–5.3)
Sodium: 136 mEq/L — ABNORMAL LOW (ref 137–147)

## 2014-06-10 SURGERY — INSERTION, INTRAMEDULLARY ROD, HUMERUS
Anesthesia: General | Site: Arm Upper | Laterality: Right

## 2014-06-10 MED ORDER — EPHEDRINE SULFATE 50 MG/ML IJ SOLN
INTRAMUSCULAR | Status: AC
  Filled 2014-06-10: qty 1

## 2014-06-10 MED ORDER — ACETAMINOPHEN 325 MG PO TABS
650.0000 mg | ORAL_TABLET | Freq: Four times a day (QID) | ORAL | Status: DC | PRN
  Administered 2014-06-11: 650 mg via ORAL
  Filled 2014-06-10: qty 2

## 2014-06-10 MED ORDER — LIDOCAINE HCL (CARDIAC) 20 MG/ML IV SOLN
INTRAVENOUS | Status: AC
  Filled 2014-06-10: qty 5

## 2014-06-10 MED ORDER — NEOSTIGMINE METHYLSULFATE 10 MG/10ML IV SOLN
INTRAVENOUS | Status: AC
  Filled 2014-06-10: qty 1

## 2014-06-10 MED ORDER — SODIUM CHLORIDE 0.9 % IJ SOLN
INTRAMUSCULAR | Status: AC
  Filled 2014-06-10: qty 10

## 2014-06-10 MED ORDER — FAMOTIDINE 20 MG PO TABS
20.0000 mg | ORAL_TABLET | Freq: Every day | ORAL | Status: DC
  Administered 2014-06-11 – 2014-06-13 (×3): 20 mg via ORAL
  Filled 2014-06-10 (×3): qty 1

## 2014-06-10 MED ORDER — FUROSEMIDE 40 MG PO TABS
40.0000 mg | ORAL_TABLET | Freq: Every day | ORAL | Status: DC
  Administered 2014-06-10 – 2014-06-13 (×4): 40 mg via ORAL
  Filled 2014-06-10 (×4): qty 1

## 2014-06-10 MED ORDER — ROCURONIUM BROMIDE 100 MG/10ML IV SOLN
INTRAVENOUS | Status: DC | PRN
  Administered 2014-06-10: 50 mg via INTRAVENOUS

## 2014-06-10 MED ORDER — ALUMINUM HYDROXIDE GEL 320 MG/5ML PO SUSP
15.0000 mL | ORAL | Status: DC | PRN
  Filled 2014-06-10: qty 30

## 2014-06-10 MED ORDER — HYDROMORPHONE HCL 1 MG/ML IJ SOLN: 0.2500 mg | INTRAMUSCULAR | Status: DC | PRN

## 2014-06-10 MED ORDER — ATORVASTATIN CALCIUM 20 MG PO TABS
20.0000 mg | ORAL_TABLET | Freq: Every day | ORAL | Status: DC
  Administered 2014-06-12: 20 mg via ORAL
  Filled 2014-06-10 (×4): qty 1

## 2014-06-10 MED ORDER — BISACODYL 5 MG PO TBEC: 5.0000 mg | DELAYED_RELEASE_TABLET | Freq: Every day | ORAL | Status: DC | PRN

## 2014-06-10 MED ORDER — PHENYLEPHRINE HCL 10 MG/ML IJ SOLN
10.0000 mg | INTRAVENOUS | Status: DC | PRN
  Administered 2014-06-10: 20 ug/min via INTRAVENOUS

## 2014-06-10 MED ORDER — DEXAMETHASONE SODIUM PHOSPHATE 4 MG/ML IJ SOLN
INTRAMUSCULAR | Status: DC | PRN
  Administered 2014-06-10: 4 mg via INTRAVENOUS

## 2014-06-10 MED ORDER — PROPOFOL 10 MG/ML IV BOLUS
INTRAVENOUS | Status: AC
  Filled 2014-06-10: qty 20

## 2014-06-10 MED ORDER — NEOSTIGMINE METHYLSULFATE 10 MG/10ML IV SOLN
INTRAVENOUS | Status: DC | PRN
  Administered 2014-06-10: 4 mg via INTRAVENOUS

## 2014-06-10 MED ORDER — CLINDAMYCIN PHOSPHATE 600 MG/50ML IV SOLN
600.0000 mg | Freq: Four times a day (QID) | INTRAVENOUS | Status: AC
  Administered 2014-06-10 – 2014-06-11 (×3): 600 mg via INTRAVENOUS
  Filled 2014-06-10 (×4): qty 50

## 2014-06-10 MED ORDER — 0.9 % SODIUM CHLORIDE (POUR BTL) OPTIME
TOPICAL | Status: DC | PRN
  Administered 2014-06-10: 1000 mL

## 2014-06-10 MED ORDER — DULOXETINE HCL 60 MG PO CPEP
60.0000 mg | ORAL_CAPSULE | Freq: Every morning | ORAL | Status: DC
  Administered 2014-06-11 – 2014-06-13 (×3): 60 mg via ORAL
  Filled 2014-06-10 (×3): qty 1

## 2014-06-10 MED ORDER — GLYCOPYRROLATE 0.2 MG/ML IJ SOLN
INTRAMUSCULAR | Status: DC | PRN
  Administered 2014-06-10: .5 mg via INTRAVENOUS

## 2014-06-10 MED ORDER — PHENOL 1.4 % MT LIQD: 1.0000 | OROMUCOSAL | Status: DC | PRN

## 2014-06-10 MED ORDER — BUPIVACAINE-EPINEPHRINE (PF) 0.5% -1:200000 IJ SOLN
INTRAMUSCULAR | Status: DC | PRN
  Administered 2014-06-10: 30 mL via PERINEURAL

## 2014-06-10 MED ORDER — SODIUM CHLORIDE 0.9 % IV SOLN
INTRAVENOUS | Status: DC
  Administered 2014-06-11: 04:00:00 via INTRAVENOUS

## 2014-06-10 MED ORDER — DOCUSATE SODIUM 100 MG PO CAPS
100.0000 mg | ORAL_CAPSULE | Freq: Two times a day (BID) | ORAL | Status: DC
  Administered 2014-06-10 – 2014-06-12 (×5): 100 mg via ORAL
  Filled 2014-06-10 (×7): qty 1

## 2014-06-10 MED ORDER — MEPERIDINE HCL 25 MG/ML IJ SOLN: 6.2500 mg | INTRAMUSCULAR | Status: DC | PRN

## 2014-06-10 MED ORDER — DEXAMETHASONE SODIUM PHOSPHATE 4 MG/ML IJ SOLN
INTRAMUSCULAR | Status: AC
Start: 2014-06-10 — End: 2014-06-10
  Filled 2014-06-10: qty 1

## 2014-06-10 MED ORDER — LACTATED RINGERS IV SOLN
INTRAVENOUS | Status: DC | PRN
  Administered 2014-06-10 (×2): via INTRAVENOUS

## 2014-06-10 MED ORDER — DIPHENHYDRAMINE HCL 12.5 MG/5ML PO ELIX: 12.5000 mg | ORAL_SOLUTION | ORAL | Status: DC | PRN

## 2014-06-10 MED ORDER — LACTATED RINGERS IV SOLN
INTRAVENOUS | Status: DC
  Administered 2014-06-10: 10:00:00 via INTRAVENOUS

## 2014-06-10 MED ORDER — ACETAMINOPHEN 650 MG RE SUPP: 650.0000 mg | Freq: Four times a day (QID) | RECTAL | Status: DC | PRN

## 2014-06-10 MED ORDER — LIDOCAINE HCL (CARDIAC) 20 MG/ML IV SOLN
INTRAVENOUS | Status: DC | PRN
  Administered 2014-06-10: 100 mg via INTRAVENOUS

## 2014-06-10 MED ORDER — HYDROCODONE-ACETAMINOPHEN 5-325 MG PO TABS
1.0000 | ORAL_TABLET | ORAL | Status: DC | PRN
Start: 2014-06-10 — End: 2014-06-13
  Administered 2014-06-11: 2 via ORAL
  Administered 2014-06-11 – 2014-06-12 (×4): 1 via ORAL
  Filled 2014-06-10 (×4): qty 1
  Filled 2014-06-10: qty 2

## 2014-06-10 MED ORDER — METOCLOPRAMIDE HCL 5 MG/ML IJ SOLN: 5.0000 mg | Freq: Three times a day (TID) | INTRAMUSCULAR | Status: DC | PRN

## 2014-06-10 MED ORDER — DILTIAZEM HCL ER 180 MG PO CP24
180.0000 mg | ORAL_CAPSULE | Freq: Every morning | ORAL | Status: DC
  Administered 2014-06-11 – 2014-06-13 (×3): 180 mg via ORAL
  Filled 2014-06-10 (×3): qty 1

## 2014-06-10 MED ORDER — OXYCODONE HCL 5 MG/5ML PO SOLN: 5.0000 mg | Freq: Once | ORAL | Status: DC | PRN

## 2014-06-10 MED ORDER — MENTHOL 3 MG MT LOZG: 1.0000 | LOZENGE | OROMUCOSAL | Status: DC | PRN

## 2014-06-10 MED ORDER — ONDANSETRON HCL 4 MG/2ML IJ SOLN
INTRAMUSCULAR | Status: AC
  Filled 2014-06-10: qty 2

## 2014-06-10 MED ORDER — MIDAZOLAM HCL 2 MG/2ML IJ SOLN
INTRAMUSCULAR | Status: DC | PRN
  Administered 2014-06-10: 0.5 mg via INTRAVENOUS

## 2014-06-10 MED ORDER — OXYCODONE HCL 5 MG PO TABS
5.0000 mg | ORAL_TABLET | ORAL | Status: DC | PRN
  Administered 2014-06-12: 5 mg via ORAL
  Filled 2014-06-10 (×2): qty 1

## 2014-06-10 MED ORDER — ONDANSETRON HCL 4 MG/2ML IJ SOLN
INTRAMUSCULAR | Status: DC | PRN
  Administered 2014-06-10: 4 mg via INTRAVENOUS

## 2014-06-10 MED ORDER — PROPOFOL 10 MG/ML IV BOLUS
INTRAVENOUS | Status: DC | PRN
  Administered 2014-06-10: 100 mg via INTRAVENOUS

## 2014-06-10 MED ORDER — MIDAZOLAM HCL 2 MG/2ML IJ SOLN
INTRAMUSCULAR | Status: AC
  Filled 2014-06-10: qty 2

## 2014-06-10 MED ORDER — FENTANYL CITRATE 0.05 MG/ML IJ SOLN
INTRAMUSCULAR | Status: AC
  Filled 2014-06-10: qty 5

## 2014-06-10 MED ORDER — ONDANSETRON HCL 4 MG/2ML IJ SOLN: 4.0000 mg | Freq: Once | INTRAMUSCULAR | Status: DC | PRN

## 2014-06-10 MED ORDER — ROCURONIUM BROMIDE 50 MG/5ML IV SOLN
INTRAVENOUS | Status: AC
Start: 2014-06-10 — End: 2014-06-10
  Filled 2014-06-10: qty 1

## 2014-06-10 MED ORDER — MORPHINE SULFATE 2 MG/ML IJ SOLN: 1.0000 mg | INTRAMUSCULAR | Status: DC | PRN

## 2014-06-10 MED ORDER — OXYCODONE HCL 5 MG PO TABS: 5.0000 mg | ORAL_TABLET | Freq: Once | ORAL | Status: DC | PRN

## 2014-06-10 MED ORDER — ZOLPIDEM TARTRATE 5 MG PO TABS
5.0000 mg | ORAL_TABLET | Freq: Every evening | ORAL | Status: DC | PRN
  Administered 2014-06-11 – 2014-06-12 (×2): 5 mg via ORAL
  Filled 2014-06-10 (×2): qty 1

## 2014-06-10 MED ORDER — METOPROLOL TARTRATE 50 MG PO TABS
50.0000 mg | ORAL_TABLET | Freq: Two times a day (BID) | ORAL | Status: DC
  Administered 2014-06-10 – 2014-06-13 (×6): 50 mg via ORAL
  Filled 2014-06-10 (×7): qty 1

## 2014-06-10 MED ORDER — FLEET ENEMA 7-19 GM/118ML RE ENEM: 1.0000 | ENEMA | Freq: Once | RECTAL | Status: AC | PRN

## 2014-06-10 MED ORDER — ONDANSETRON HCL 4 MG PO TABS: 4.0000 mg | ORAL_TABLET | Freq: Four times a day (QID) | ORAL | Status: DC | PRN

## 2014-06-10 MED ORDER — FENTANYL CITRATE 0.05 MG/ML IJ SOLN
INTRAMUSCULAR | Status: DC | PRN
  Administered 2014-06-10: 50 ug via INTRAVENOUS
  Administered 2014-06-10: 100 ug via INTRAVENOUS

## 2014-06-10 MED ORDER — POLYETHYLENE GLYCOL 3350 17 G PO PACK
17.0000 g | PACK | Freq: Every day | ORAL | Status: DC | PRN
  Administered 2014-06-12: 17 g via ORAL
  Filled 2014-06-10: qty 1

## 2014-06-10 MED ORDER — ONDANSETRON HCL 4 MG/2ML IJ SOLN: 4.0000 mg | Freq: Four times a day (QID) | INTRAMUSCULAR | Status: DC | PRN

## 2014-06-10 MED ORDER — LORAZEPAM 0.5 MG PO TABS
0.5000 mg | ORAL_TABLET | Freq: Three times a day (TID) | ORAL | Status: DC | PRN
  Administered 2014-06-12: 0.5 mg via ORAL
  Filled 2014-06-10: qty 1

## 2014-06-10 MED ORDER — ZOLPIDEM TARTRATE 5 MG PO TABS: 10.0000 mg | ORAL_TABLET | Freq: Every evening | ORAL | Status: DC | PRN

## 2014-06-10 MED ORDER — GLYCOPYRROLATE 0.2 MG/ML IJ SOLN
INTRAMUSCULAR | Status: AC
  Filled 2014-06-10: qty 3

## 2014-06-10 MED ORDER — METOCLOPRAMIDE HCL 10 MG PO TABS: 5.0000 mg | ORAL_TABLET | Freq: Three times a day (TID) | ORAL | Status: DC | PRN

## 2014-06-10 SURGICAL SUPPLY — 71 items
BIT DRILL 3X200 (DRILL) ×1 IMPLANT
BLADE SURG 15 STRL LF DISP TIS (BLADE) ×1 IMPLANT
BLADE SURG 15 STRL SS (BLADE) ×2
CHLORAPREP W/TINT 26ML (MISCELLANEOUS) ×2 IMPLANT
COVER SURGICAL LIGHT HANDLE (MISCELLANEOUS) ×2 IMPLANT
DRAPE C-ARMOR (DRAPES) ×1 IMPLANT
DRAPE INCISE IOBAN 66X45 STRL (DRAPES) ×2 IMPLANT
DRAPE SURG 17X23 STRL (DRAPES) ×2 IMPLANT
DRAPE U-SHAPE 47X51 STRL (DRAPES) ×2 IMPLANT
DRSG ADAPTIC 3X8 NADH LF (GAUZE/BANDAGES/DRESSINGS) ×1 IMPLANT
DRSG MEPILEX BORDER 4X4 (GAUZE/BANDAGES/DRESSINGS) ×3 IMPLANT
DRSG PAD ABDOMINAL 8X10 ST (GAUZE/BANDAGES/DRESSINGS) ×2 IMPLANT
ELECT BLADE 4.0 EZ CLEAN MEGAD (MISCELLANEOUS) ×2
ELECT REM PT RETURN 9FT ADLT (ELECTROSURGICAL) ×2
ELECTRODE BLDE 4.0 EZ CLN MEGD (MISCELLANEOUS) ×1 IMPLANT
ELECTRODE REM PT RTRN 9FT ADLT (ELECTROSURGICAL) ×1 IMPLANT
GAUZE SPONGE 4X4 12PLY STRL (GAUZE/BANDAGES/DRESSINGS) ×1 IMPLANT
GLOVE BIO SURGEON STRL SZ7 (GLOVE) ×3 IMPLANT
GLOVE BIO SURGEON STRL SZ7.5 (GLOVE) ×2 IMPLANT
GLOVE BIOGEL PI IND STRL 6.5 (GLOVE) IMPLANT
GLOVE BIOGEL PI IND STRL 7.0 (GLOVE) IMPLANT
GLOVE BIOGEL PI IND STRL 8 (GLOVE) ×1 IMPLANT
GLOVE BIOGEL PI INDICATOR 6.5 (GLOVE) ×1
GLOVE BIOGEL PI INDICATOR 7.0 (GLOVE) ×2
GLOVE BIOGEL PI INDICATOR 8 (GLOVE) ×2
GLOVE SURG SS PI 6.0 STRL IVOR (GLOVE) ×1 IMPLANT
GOWN STRL REUS W/ TWL LRG LVL3 (GOWN DISPOSABLE) ×3 IMPLANT
GOWN STRL REUS W/ TWL XL LVL3 (GOWN DISPOSABLE) ×1 IMPLANT
GOWN STRL REUS W/TWL LRG LVL3 (GOWN DISPOSABLE) ×6
GOWN STRL REUS W/TWL XL LVL3 (GOWN DISPOSABLE) ×2
KIT BASIN OR (CUSTOM PROCEDURE TRAY) ×2 IMPLANT
KIT ROOM TURNOVER OR (KITS) ×2 IMPLANT
MANIFOLD NEPTUNE II (INSTRUMENTS) ×2 IMPLANT
NAIL HUMERAL RIGHT (Nail) ×1 IMPLANT
NDL HYPO 25GX1X1/2 BEV (NEEDLE) ×1 IMPLANT
NDL MAYO TROCAR (NEEDLE) ×1 IMPLANT
NEEDLE HYPO 25GX1X1/2 BEV (NEEDLE) ×2 IMPLANT
NEEDLE MAYO TROCAR (NEEDLE) ×2 IMPLANT
NS IRRIG 1000ML POUR BTL (IV SOLUTION) ×2 IMPLANT
PACK SHOULDER (CUSTOM PROCEDURE TRAY) ×2 IMPLANT
PAD ARMBOARD 7.5X6 YLW CONV (MISCELLANEOUS) ×4 IMPLANT
SCREW DISTAL 4.3X26 (Screw) ×1 IMPLANT
SCREW DISTAL 5X32MM (Screw) ×1 IMPLANT
SCREW PROXIMAL CANN 5X40MM (Screw) ×1 IMPLANT
SLING ARM IMMOBILIZER LRG (SOFTGOODS) ×1 IMPLANT
SLING ARM IMMOBILIZER MED (SOFTGOODS) ×1 IMPLANT
SPONGE GAUZE 4X4 12PLY STER LF (GAUZE/BANDAGES/DRESSINGS) ×1 IMPLANT
SPONGE LAP 18X18 X RAY DECT (DISPOSABLE) ×2 IMPLANT
SPONGE LAP 4X18 X RAY DECT (DISPOSABLE) ×5 IMPLANT
STRIP CLOSURE SKIN 1/2X4 (GAUZE/BANDAGES/DRESSINGS) ×3 IMPLANT
SUCTION FRAZIER TIP 10 FR DISP (SUCTIONS) ×2 IMPLANT
SUPPORT WRAP ARM LG (MISCELLANEOUS) ×1 IMPLANT
SUT FIBERWIRE #2 38 T-5 BLUE (SUTURE) ×10
SUT MNCRL AB 4-0 PS2 18 (SUTURE) ×2 IMPLANT
SUT PDS AB 1 CT  36 (SUTURE) ×1
SUT PDS AB 1 CT 36 (SUTURE) IMPLANT
SUT PDS AB 2-0 CT1 27 (SUTURE) IMPLANT
SUT SILK 2 0 TIES 17X18 (SUTURE) ×2
SUT SILK 2-0 18XBRD TIE BLK (SUTURE) ×1 IMPLANT
SUT VIC AB 0 CTB1 27 (SUTURE) ×4 IMPLANT
SUT VIC AB 1 CT1 27 (SUTURE) ×2
SUT VIC AB 1 CT1 27XBRD ANTBC (SUTURE) ×1 IMPLANT
SUT VIC AB 2-0 CT1 27 (SUTURE) ×2
SUT VIC AB 2-0 CT1 TAPERPNT 27 (SUTURE) IMPLANT
SUTURE FIBERWR #2 38 T-5 BLUE (SUTURE) ×5 IMPLANT
SYR CONTROL 10ML LL (SYRINGE) ×2 IMPLANT
TOWEL OR 17X24 6PK STRL BLUE (TOWEL DISPOSABLE) ×2 IMPLANT
TOWEL OR 17X26 10 PK STRL BLUE (TOWEL DISPOSABLE) ×2 IMPLANT
WATER STERILE IRR 1000ML POUR (IV SOLUTION) ×2 IMPLANT
WIRE GUIDE MODEL 22X500MM (WIRE) ×1 IMPLANT
YANKAUER SUCT BULB TIP NO VENT (SUCTIONS) ×2 IMPLANT

## 2014-06-10 NOTE — Op Note (Signed)
Procedure(s): INTRAMEDULLARY (IM) NAIL HUMERAL Procedure Note  Carrie Best female 80 y.o. 06/10/2014  Procedure(s) and Anesthesia Type:    Open reduction internal fixation right proximal humerus fracture with intramedullary nail  Surgeon(s) and Role:    * Mable Paris, MD - Primary   Indications:  80 y.o. female s/p fall with right displaced proximal humerus fracture. Indicated for surgery to promote anatomic healing and prevent complications of nonunion, skin problems, chronic pain.     Surgeon: Mable Paris   Assistants: Damita Lack PA-C [facility] was present and scrubbed throughout the procedure and was essential in positioning, retraction, exposure, and closure)  Anesthesia: General endotracheal anesthesia with preoperative interscalene block given by the attending anesthesiologist      Procedure Detail  INTRAMEDULLARY (IM) NAIL HUMERAL  Findings: Anatomic reduction of the fracture with a Tornier proximal humerus nail with 2 locking proximal screws and one dynamic distal interlocking screw.  Estimated Blood Loss:  less than 50 mL         Drains: none  Blood Given: none         Specimens: none        Complications:  * No complications entered in OR log *         Disposition: PACU - hemodynamically stable.         Condition: stable    Procedure:  DESCRIPTION OF PROCEDURE: The patient was identified in preoperative  holding area where I personally marked the operative site after  verifying site, side, and procedure with the patient. The patient was taken back  to the operating room where general anesthesia was induced without  complication and was placed in the beach-chair position with the back  elevated about 60 degrees and all extremities carefully padded and  positioned. The right upper extremity was then prepped and  draped in a standard sterile fashion. The appropriate time-out  procedure was carried out. The patient  did receive IV antibiotics  within 30 minutes of incision.  An incision was made in [place] lines just lateral to the anterolateral acromion. Total incision length was less than 4 cm. Dissection was carried down to subcutaneous tissues to the level of the deltoid which was split longitudinally in line with the from the acromion. The deltoid was taken down off of the anterior acromion. The rotator cuff was exposed a small retractor was placed. The fracture was reduced with forward flexion. The position of the guidepin was carefully identified with fluoroscopic imaging in grashy and outlet orthogonal views. The sharp awl was then used to penetrate the superior aspect of the humeral head for entrance of the nail which was then advanced over the guidepin which was past the fracture site. After her dance of the nail fracture was held anatomically reduced and 2 proximal interlocking screws were placed percutaneously spreading down the bone to avoid injury to the axillary nerve. One distal interlocking screw was then placed in the dynamic slot. Final fluoroscopic imaging demonstrated anatomic alignment fracture and appropriate nail and screw position. Wounds were copiously irrigated with normal saline subsequent to closed in layers with #1 PDS to close the deltoid back to the acromion. 2-0 Vicryl and 4-0 Monocryl for skin closure. A light sterile dressing was applied and the patient was allowed to awaken from anesthesia, transferred to stretcher, and taken to the recovery room in stable condition  Postoperative plan: She will be kept overnight for observation. If she is able to get up and move around with physical  therapy tomorrow and her pain is well-controlled she will be discharged home with her family.

## 2014-06-10 NOTE — H&P (Signed)
Carrie Best is an 80 y.o. female.   Chief Complaint: Right shoulder injury HPI: Status post fall with displaced right proximal humerus fracture. Patient was previously quite functional. She was with her daughter. Pain is severe, worse with movement, better with rest.  Past Medical History  Diagnosis Date  . Hypertension   . Arthritis   . Diastolic dysfunction   . Hyperlipidemia   . Congestive heart failure     Congestive heart failure symptoms  . Heart murmur   . Dysrhythmia 05-10-12    hx. A. Fib., rate is controlled-tx. Xarelto  . Pneumonia 05-10-12    dx. in July- tx. antibiotics  . Hearing loss 05-10-12    bilateral hearing aids.  Marland Kitchen Dyspnea 05-10-12    recent 2-3 weeks hospital visit 7'13 pneumonia.  Marland Kitchen GERD (gastroesophageal reflux disease) 05-10-12    tx. with meds as needed  . Chronic kidney disease 05-10-12    hx. recent kidney stones passed.  . Depression   . Stroke 05-10-12    Lt.CVA note on scans-hx. freq. falls/ none past 6 months 01/21/14    Past Surgical History  Procedure Laterality Date  . Appendectomy    . Other surgical history      Bladder Surgery  . Interstim implant placement  05-10-12    now malfunctioning  . Cataract extraction, bilateral  05-10-12    bilateral  . Interstim implant removal  05/16/2012    Procedure: REMOVAL OF INTERSTIM IMPLANT;  Surgeon: Reece Packer, MD;  Location: [facility];  Service: Urology;  Laterality: N/A;  . Cystoscopy with retrograde pyelogram, ureteroscopy and stent placement Right 01/31/2014    Procedure: CYSTOSCOPY WITH bilateral  RETROGRADE PYELOGRAM, right URETEROSCOPY AND right  STENT PLACEMENT, bladder biopsy with fulgeration;  Surgeon: Alexis Frock, MD;  Location: [facility];  Service: Urology;  Laterality: Right;  . Holmium laser application Right 02/17/4314    Procedure: HOLMIUM LASER APPLICATION;  Surgeon: Alexis Frock, MD;  Location: [facility];  Service: Urology;  Laterality: Right;    Family History  Problem Relation Age of  Onset  . Stroke Mother   . Stroke Brother    Social History:  reports that she has never smoked. She has never used smokeless tobacco. She reports that she does not drink alcohol or use illicit drugs.  Allergies:  Allergies  Allergen Reactions  . Codeine Nausea And Vomiting  . Penicillins Hives  . Sulfur Nausea And Vomiting    Medications Prior to Admission  Medication Sig Dispense Refill  . diltiazem (DILACOR XR) 180 MG 24 hr capsule Take 180 mg by mouth every morning.      . DULoxetine (CYMBALTA) 60 MG capsule Take 60 mg by mouth every morning.       . fish oil-omega-3 fatty acids 1000 MG capsule Take 1 g by mouth daily.      . furosemide (LASIX) 40 MG tablet Take 40 mg by mouth daily.      Marland Kitchen HYDROcodone-acetaminophen (NORCO) 5-325 MG per tablet Take 2 tablets by mouth every 4 (four) hours as needed.  20 tablet  0  . LORazepam (ATIVAN) 0.5 MG tablet Take 0.5 mg by mouth every 8 (eight) hours as needed for anxiety.      . metoprolol tartrate (LOPRESSOR) 25 MG tablet Take 50 mg by mouth 2 (two) times daily.       . ranitidine (ZANTAC) 150 MG capsule Take 150 mg by mouth daily as needed. Reflux      .  senna-docusate (SENOKOT-S) 8.6-50 MG per tablet Take 1 tablet by mouth 2 (two) times daily. While taking pain meds to prevent constipation  30 tablet  0  . zolpidem (AMBIEN) 10 MG tablet Take 10 mg by mouth at bedtime as needed. For sleep      . acetaminophen (TYLENOL ARTHRITIS PAIN) 650 MG CR tablet Take 650 mg by mouth every 8 (eight) hours as needed. For pain      . atorvastatin (LIPITOR) 20 MG tablet Take 1 tablet (20 mg total) by mouth at bedtime.  30 tablet  11  . ondansetron (ZOFRAN-ODT) 8 MG disintegrating tablet Take 1 tablet (8 mg total) by mouth every 12 (twelve) hours as needed for nausea.  20 tablet  0  . potassium chloride SA (K-DUR,KLOR-CON) 20 MEQ tablet Take 1 tablet (20 mEq total) by mouth daily.  30 tablet  11  . Rivaroxaban (XARELTO) 20 MG TABS Take 20 mg by mouth every  morning.       . traMADol (ULTRAM) 50 MG tablet Take 1 tablet (50 mg total) by mouth every 6 (six) hours as needed for moderate pain. Post-operatively  30 tablet  0    Results for orders placed during the hospital encounter of 06/10/14 (from the past 48 hour(s))  BASIC METABOLIC PANEL     Status: Abnormal   Collection Time    06/10/14  9:57 AM      Result Value Ref Range   Sodium 136 (*) 137 - 147 mEq/L   Potassium 3.7  3.7 - 5.3 mEq/L   Chloride 99  96 - 112 mEq/L   CO2 26  19 - 32 mEq/L   Glucose, Bld 134 (*) 70 - 99 mg/dL   BUN 14  6 - 23 mg/dL   Creatinine, Ser 0.98  0.50 - 1.10 mg/dL   Calcium 9.4  8.4 - 10.5 mg/dL   GFR calc non Af Amer 52 (*) >90 mL/min   GFR calc Af Amer 60 (*) >90 mL/min   Comment: (NOTE)     The eGFR has been calculated using the CKD EPI equation.     This calculation has not been validated in all clinical situations.     eGFR's persistently <90 mL/min signify possible Chronic Kidney     Disease.   Anion gap 11  5 - 15  CBC     Status: Abnormal   Collection Time    06/10/14  9:57 AM      Result Value Ref Range   WBC 8.7  4.0 - 10.5 K/uL   RBC 3.29 (*) 3.87 - 5.11 MIL/uL   Hemoglobin 9.1 (*) 12.0 - 15.0 g/dL   HCT 28.5 (*) 36.0 - 46.0 %   MCV 86.6  78.0 - 100.0 fL   MCH 27.7  26.0 - 34.0 pg   MCHC 31.9  30.0 - 36.0 g/dL   RDW 17.5 (*) 11.5 - 15.5 %   Platelets 270  150 - 400 K/uL   No results found.  Review of Systems  All other systems reviewed and are negative.   Blood pressure 140/58, pulse 77, temperature 98.4 F (36.9 C), temperature source Oral, resp. rate 18, height 5' 8"  (1.727 m), weight 88.451 kg (195 lb), SpO2 98.00%. Physical Exam  Constitutional: She is oriented to person, place, and time. She appears well-developed and well-nourished.  HENT:  Head: Atraumatic.  Eyes: EOM are normal.  Cardiovascular: Intact distal pulses.   Respiratory: Effort normal.  Musculoskeletal:  Right shoulder with  significant ecchymosis and  tenderness proximally. Distally neurovascularly intact.   Neurological: She is alert and oriented to person, place, and time.  Skin: Skin is warm and dry.  Psychiatric: She has a normal mood and affect.     Assessment/Plan Right displaced proximal humerus fracture Plan right shoulder proximal humerus intramedullary nail She will be kept overnight for observation and hopefully discharged home tomorrow if she can mobilize with physical therapy. Risks / benefits of surgery discussed Consent on chart  NPO for OR Preop antibiotics   Tyqwan Pink WILLIAM 06/10/2014, 11:27 AM

## 2014-06-10 NOTE — Anesthesia Preprocedure Evaluation (Addendum)
Anesthesia Evaluation  Patient identified by MRN, date of birth, ID band Patient awake    Reviewed: Allergy & Precautions, NPO status , Patient's Chart, lab work & pertinent test results  History of Anesthesia Complications Negative for: history of anesthetic complications  Airway Mallampati: I TM Distance: >3 FB Neck ROM: Limited    Dental  (+) Teeth Intact, Dental Advisory Given   Pulmonary shortness of breath,    + decreased breath sounds      Cardiovascular hypertension, Pt. on medications +CHF + dysrhythmias Atrial Fibrillation Rhythm:Irregular Rate:Normal     Neuro/Psych Right sided weakness CVA, Residual Symptoms    GI/Hepatic Neg liver ROS, GERD-  Controlled and Medicated,  Endo/Other  negative endocrine ROS  Renal/GU      Musculoskeletal  (+) Arthritis -,   Abdominal (+) + obese,   Peds  Hematology   Anesthesia Other Findings   Reproductive/Obstetrics                        Anesthesia Physical Anesthesia Plan  ASA: III  Anesthesia Plan: General   Post-op Pain Management:    Induction: Intravenous  Airway Management Planned: Oral ETT  Additional Equipment:   Intra-op Plan:   Post-operative Plan: Extubation in OR  Informed Consent: I have reviewed the patients History and Physical, chart, labs and discussed the procedure including the risks, benefits and alternatives for the proposed anesthesia with the patient or authorized representative who has indicated his/her understanding and acceptance.   Dental advisory given  Plan Discussed with: CRNA and Surgeon  Anesthesia Plan Comments:         Anesthesia Quick Evaluation

## 2014-06-10 NOTE — Transfer of Care (Signed)
Immediate Anesthesia Transfer of Care Note  Patient: Carrie DikeFrances A Best  Procedure(s) Performed: Procedure(s) with comments: INTRAMEDULLARY (IM) NAIL HUMERAL (Right) - Right intramedullary humeral nail  Patient Location: PACU  Anesthesia Type:General  Level of Consciousness: oriented and patient cooperative  Airway & Oxygen Therapy: Patient Spontanous Breathing and Patient connected to face mask oxygen  Post-op Assessment: Report given to PACU RN and Post -op Vital signs reviewed and stable  Post vital signs: Reviewed  Complications: No apparent anesthesia complications

## 2014-06-10 NOTE — Anesthesia Postprocedure Evaluation (Signed)
Anesthesia Post Note  Patient: Carrie Best  Procedure(s) Performed: Procedure(s) (LRB): INTRAMEDULLARY (IM) NAIL HUMERAL (Right)  Anesthesia type: general  Patient location: PACU  Post pain: Pain level controlled  Post assessment: Patient's Cardiovascular Status Stable  Last Vitals:  Filed Vitals:   06/10/14 1551  BP: 120/68  Pulse: 80  Temp:   Resp: 12    Post vital signs: Reviewed and stable  Level of consciousness: sedated  Complications: No apparent anesthesia complications

## 2014-06-10 NOTE — Anesthesia Procedure Notes (Addendum)
Anesthesia Regional Block:  Interscalene brachial plexus block  Pre-Anesthetic Checklist: ,, timeout performed, Correct Patient, Correct Site, Correct Laterality, Correct Procedure, Correct Position, site marked, Risks and benefits discussed,  Surgical consent,  Pre-op evaluation,  At surgeon's request and post-op pain management  Laterality: Right  Prep: chloraprep       Needles:  Injection technique: Single-shot  Needle Type: Echogenic Stimulator Needle     Needle Length: 9cm 9 cm Needle Gauge: 21 and 21 G    Additional Needles:  Procedures: ultrasound guided (picture in chart) and nerve stimulator Interscalene brachial plexus block  Nerve Stimulator or Paresthesia:  Response: 0.4 mA,   Additional Responses:   Narrative:  Start time: 06/10/2014 11:40 AM End time: 06/10/2014 11:50 AM Injection made incrementally with aspirations every 5 mL.  Performed by: Personally  Anesthesiologist: Arta BruceKevin Ossey MD  Additional Notes: Monitors applied. Patient sedated. Sterile prep and drape,hand hygiene and sterile gloves were used. Relevant anatomy identified.Needle position confirmed.Local anesthetic injected incrementally after negative aspiration. Local anesthetic spread visualized around nerve(s). Vascular puncture avoided. No complications. Image printed for medical record.The patient tolerated the procedure well.        Procedure Name: Intubation Date/Time: 06/10/2014 12:06 PM Performed by: Romie MinusOCK, Caelen Reierson K Pre-anesthesia Checklist: Patient identified, Emergency Drugs available, Suction available, Patient being monitored and Timeout performed Patient Re-evaluated:Patient Re-evaluated prior to inductionOxygen Delivery Method: Circle system utilized Preoxygenation: Pre-oxygenation with 100% oxygen Intubation Type: IV induction Ventilation: Mask ventilation without difficulty Laryngoscope Size: Miller and 2 Grade View: Grade II Tube type: Oral Tube size: 7.0 mm Number of  attempts: 1 Airway Equipment and Method: Stylet Placement Confirmation: positive ETCO2,  CO2 detector and breath sounds checked- equal and bilateral Secured at: 21 cm Tube secured with: Tape Dental Injury: Teeth and Oropharynx as per pre-operative assessment

## 2014-06-11 DIAGNOSIS — S42201A Unspecified fracture of upper end of right humerus, initial encounter for closed fracture: Secondary | ICD-10-CM | POA: Diagnosis not present

## 2014-06-11 LAB — CBC
HEMATOCRIT: 25.8 % — AB (ref 36.0–46.0)
HEMOGLOBIN: 8.4 g/dL — AB (ref 12.0–15.0)
MCH: 28.3 pg (ref 26.0–34.0)
MCHC: 32.6 g/dL (ref 30.0–36.0)
MCV: 86.9 fL (ref 78.0–100.0)
Platelets: 284 10*3/uL (ref 150–400)
RBC: 2.97 MIL/uL — AB (ref 3.87–5.11)
RDW: 17.9 % — ABNORMAL HIGH (ref 11.5–15.5)
WBC: 10.4 10*3/uL (ref 4.0–10.5)

## 2014-06-11 LAB — BASIC METABOLIC PANEL
Anion gap: 13 (ref 5–15)
BUN: 14 mg/dL (ref 6–23)
CO2: 25 meq/L (ref 19–32)
Calcium: 9.1 mg/dL (ref 8.4–10.5)
Chloride: 96 mEq/L (ref 96–112)
Creatinine, Ser: 1.02 mg/dL (ref 0.50–1.10)
GFR calc Af Amer: 57 mL/min — ABNORMAL LOW (ref >90)
GFR calc non Af Amer: 49 mL/min — ABNORMAL LOW (ref >90)
GLUCOSE: 165 mg/dL — AB (ref 70–99)
POTASSIUM: 4.1 meq/L (ref 3.7–5.3)
Sodium: 134 mEq/L — ABNORMAL LOW (ref 137–147)

## 2014-06-11 LAB — URINALYSIS, ROUTINE W REFLEX MICROSCOPIC
BILIRUBIN URINE: NEGATIVE
Glucose, UA: NEGATIVE mg/dL
KETONES UR: NEGATIVE mg/dL
Nitrite: NEGATIVE
PH: 5.5 (ref 5.0–8.0)
Protein, ur: NEGATIVE mg/dL
Specific Gravity, Urine: 1.012 (ref 1.005–1.030)
Urobilinogen, UA: 0.2 mg/dL (ref 0.0–1.0)

## 2014-06-11 LAB — URINE MICROSCOPIC-ADD ON

## 2014-06-11 NOTE — Progress Notes (Signed)
Patient hallucinating. Went into patient's room to check on patient and she she stated " I think I am seeing things. Do you see those ants? I see ants everywhere."

## 2014-06-11 NOTE — Evaluation (Signed)
Physical Therapy Evaluation Patient Details Name: Carrie DikeFrances A Hewlett MRN: [id number] DOB: February 13, 1928 Today's Date: 06/11/2014   History of Present Illness  s/p IM nail humerus, repair of R proximal humerus fracture sustained from fall. Pt presents with h/o falls, L CVA, CHF, chronic kidney disease, dyspnea.  Clinical Impression  *Pt admitted with R proximal humerus fx s/p ORIF 06/10/14**. Pt currently with functional limitations due to the deficits listed below (see PT Problem List).  Pt will benefit from skilled PT to increase their independence and safety with mobility to allow discharge to the venue listed below.   Mod assist for sit to stand and to ambulate 4' with hand held assist. Pt has poor standing balance and requires 24* assist due to high fall risk at present. SNF recommended.   **    Follow Up Recommendations SNF    Equipment Recommendations       Recommendations for Other Services OT consult     Precautions / Restrictions Precautions Precautions: Shoulder Type of Shoulder Precautions: NWB Shoulder Interventions: Shoulder sling/immobilizer Precaution Comments: reviewed NWB status, keeping arm in sling Required Braces or Orthoses: Sling Restrictions Weight Bearing Restrictions: Yes RUE Weight Bearing: Non weight bearing      Mobility  Bed Mobility               General bed mobility comments: Pt in recliner  Transfers Overall transfer level: Needs assistance Equipment used: 1 person hand held assist Transfers: Sit to/from Stand Sit to Stand: Mod assist;+2 safety/equipment         General transfer comment: Mod A to rise from recliner pushing up from armrest with LUE.  Min to Mod A for standing balance.   Ambulation/Gait Ambulation/Gait assistance: Mod assist Ambulation Distance (Feet): 4 Feet Assistive device: 1 person hand held assist Gait Pattern/deviations: Step-to pattern;Decreased step length - right;Decreased step length - left   Gait  velocity interpretation: Below normal speed for age/gender General Gait Details: Pt ambulated 4' then began urinating on floor. Assisted pt back to recliner and assisted with cleaning up.   Stairs            Wheelchair Mobility    Modified Rankin (Stroke Patients Only)       Balance Overall balance assessment: Needs assistance Sitting-balance support: Single extremity supported;Feet supported Sitting balance-Leahy Scale: Fair     Standing balance support: Single extremity supported Standing balance-Leahy Scale: Poor Standing balance comment: min to mod assist for static and dynamic standing balance, tends to have posterior LOB                             Pertinent Vitals/Pain Pain Assessment: Faces Pain Score: 6  Pain Location: R shoulder Pain Descriptors / Indicators: Sore Pain Intervention(s): Premedicated before session;Repositioned;Monitored during session    Home Living Family/patient expects to be discharged to:: Private residence Living Arrangements: Children Available Help at Discharge: Family;Available 24 hours/day Type of Home: House Home Access: Ramped entrance     Home Layout: One level Home Equipment: Bedside commode;Shower seat;Walker - 2 wheels;Wheelchair - manual;Cane - single point Additional Comments: Pt stated that she has BSC and a sits in a seat to shower PTA    Prior Function Level of Independence: Needs assistance   Gait / Transfers Assistance Needed: Pt stated she needed help getting into the shower; walked with RW with supervision PTA  ADL's / Homemaking Assistance Needed: daughter stated she mostly assisted pt with sponge bath,  Pt could independently go to toilet  Comments: pt stated she used a rw PTA     Hand Dominance   Dominant Hand: Right    Extremity/Trunk Assessment   Upper Extremity Assessment: RUE deficits/detail RUE Deficits / Details: s/p IM nail R humerus RUE: Unable to fully assess due to  immobilization       Lower Extremity Assessment: Overall WFL for tasks assessed      Cervical / Trunk Assessment: Normal  Communication   Communication: HOH  Cognition Arousal/Alertness: Awake/alert Behavior During Therapy: WFL for tasks assessed/performed Overall Cognitive Status: Within Functional Limits for tasks assessed       Memory: Decreased short-term memory              General Comments      Exercises Shoulder Exercises Elbow Flexion: AAROM;Right;10 reps;Supine Elbow Extension: AAROM;Right;10 reps;Supine Hand Exercises Wrist Flexion: AAROM;10 reps;Right;Seated Wrist Extension: AAROM;Right;10 reps;Seated Digit Composite Flexion: AROM;Right;10 reps;Seated Composite Extension: AROM;Right;10 reps;Seated ROM for elbow, wrist and digits of operated UE: Minimal assistance Sling wearing schedule (on at all times/off for ADL's): Supervision/safety Proper positioning of operated UE when showering: Minimal assistance      Assessment/Plan    PT Assessment Patient needs continued PT services  PT Diagnosis Difficulty walking;Generalized weakness;Acute pain   PT Problem List Decreased activity tolerance;Pain;Decreased balance;Decreased knowledge of use of DME;Decreased mobility  PT Treatment Interventions Gait training;Functional mobility training;Therapeutic activities;Patient/family education;Balance training;Therapeutic exercise   PT Goals (Current goals can be found in the Care Plan section) Acute Rehab PT Goals Patient Stated Goal: to go home and be able to walk PT Goal Formulation: With patient/family Time For Goal Achievement: 06/25/14 Potential to Achieve Goals: Fair    Frequency Min 3X/week   Barriers to discharge        Co-evaluation               End of Session Equipment Utilized During Treatment: Gait belt (sling) Activity Tolerance: Patient limited by pain Patient left: in chair;with call bell/phone within reach;with family/visitor  present;with nursing/sitter in room Nurse Communication: Mobility status (need for cleanup after urinating on self)    Functional Assessment Tool Used: clinical judgement Functional Limitation: Mobility: Walking and moving around Mobility: Walking and Moving Around Current Status [phone]): At least 60 percent but less than 80 percent impaired, limited or restricted Mobility: Walking and Moving Around Goal Status [phone]): At least 20 percent but less than 40 percent impaired, limited or restricted    Time: 1030-1051 PT Time Calculation (min): 21 min   Charges:   PT Evaluation $Initial PT Evaluation Tier I: 1 Procedure PT Treatments $Therapeutic Activity: 8-22 mins   PT G Codes:   Functional Assessment Tool Used: clinical judgement Functional Limitation: Mobility: Walking and moving around    Lecompte Kistler 06/11/2014, 11:02 AM [phone]

## 2014-06-11 NOTE — Clinical Social Work Psychosocial (Signed)
Clinical Social Work Department BRIEF PSYCHOSOCIAL ASSESSMENT 06/11/2014  Patient:  Carrie Best,Carrie Best     Account Number:  [id number]     Admit date:  06/10/2014  Clinical Social Worker:  Read DriversINGLE,Carrie Best, LCSWA  Date/Time:  06/11/2014 11:29 AM  Referred by:  Physician  Date Referred:  06/11/2014 Referred for  Psychosocial assessment   Other Referral:   none   Interview type:  Patient Other interview type:   none    PSYCHOSOCIAL DATA Living Status:  FAMILY Admitted from facility:   Level of care:   Primary support name:  Carrie Best Primary support relationship to patient:  CHILD, ADULT Degree of support available:   adequate    CURRENT CONCERNS Current Concerns  Post-Acute Placement   Other Concerns:   none    SOCIAL WORK ASSESSMENT / PLAN CSW assessed pt at bedside.  Pt was alert and oriented throughout the assessment, though per report pt is baseline confused.  Of note, CSW noticed pt hard of hearing.  Pt is 2+assist with abulations and projected dc is SNF for STR. Pt reports living at home with daughter, Carrie DikeSusie prior to hospital admission. Pt was admitted for fall and is currently observation status.    Pt is agreeable to possible STR/SNF once medically cleared, but looks forward to returning home.  CSW will being SNF search as pt has no familiarity with SNF/STR facilities. [place]   Assessment/plan status:  Psychosocial Support/Ongoing Assessment of Needs Other assessment/ plan:   FL2  PASARR   Information/referral to community resources:   SNF/STR  [facility] if [facility] not available    PATIENT'S/FAMILY'S RESPONSE TO PLAN OF CARE: Pt is agree[place].       Vickii PennaGina Radek Carnero, LCSWA [phone]  Psychiatric & Orthopedics (5N 1-16) Clinical Social Worker

## 2014-06-11 NOTE — Progress Notes (Addendum)
During the admission process, the patient's granddaughter was in the room to give some insight on the mobility of the patient.  Patient states that she has been unsteady for some time and this is not her first fall at home.  Patient reported that she has a BSC at home next to her bed.  She currently lives at home with her daughter, but she does not have care all day.    Patient's granddaughter would like to have a SW consult and case Production designer, theatre/television/filmmanager.  The granddaughter works here in the credit [employer] per the patient.      During the night patient refused to get [facility] or even the bed[facility], stating she was wearing her depends just as she does at home.  Patient was asked several times if she would try using the bedpan or BSC.  When assisting her this morning, patient had difficulty following simple commands, as long as you lead her she will try to step, with much difficulty, in that direction.  She is weak when standing and has to be reminded to stand.  She is a 2 person assist and she is a high risk for falls.

## 2014-06-11 NOTE — Clinical Social Work Placement (Signed)
Clinical Social Work Department CLINICAL SOCIAL WORK PLACEMENT NOTE 06/11/2014  Patient:  Carrie Best,[name]  Account Number:  [id number] Admit date:  06/10/2014  Clinical Social Worker:  Read DriversEGINA Christien Berthelot, LCSWA  Date/time:  06/11/2014 12:37 PM  Clinical Social Work is seeking post-discharge placement for this patient at the following level of care:   SKILLED NURSING   (*CSW will update this form in Epic as items are completed)   06/11/2014  Patient/family provided with [facility] of Clinical Social Work's list of facilities offering this level of care within the geographic area requested by the patient (or if unable, by the patient's family).  06/11/2014  Patient/family informed of their freedom to choose among providers that offer the needed level of care, that participate in Medicare, Medicaid or managed care program needed by the patient, have an available bed and are willing to accept the patient.  06/11/2014  Patient/family informed of [facility]' ownership interest in [facility], as well as of the fact that they are under no obligation to receive care at this facility.  PASARR submitted to EDS on 06/11/2014 PASARR number received on 06/11/2014  FL2 transmitted to all facilities in geographic area requested by pt/family on  06/11/2014 FL2 transmitted to all facilities within larger geographic area on   Patient informed that his/her managed care company has contracts with or will negotiate with  certain facilities, including the following:     Patient/family informed of bed offers received:   Patient chooses bed at  Physician recommends and patient chooses bed at    Patient to be transferred to  on   Patient to be transferred to facility by  Patient and family notified of transfer on  Name of family member notified:    The following physician request were entered in Epic:   Additional Comments:  Vickii PennaGina Jeret Goyer, LCSWA [phone]  Psychiatric &  Orthopedics (5N 1-16) Clinical Social Worker

## 2014-06-11 NOTE — Progress Notes (Addendum)
Called [facility] answering service and left message for the Dr on call. Notified that patient is still confused (only oriented to self) and is now hallucinating.

## 2014-06-11 NOTE — Evaluation (Signed)
Occupational Therapy Evaluation Patient Details Name: Carrie DikeFrances A Best MRN: [id number] DOB: 1927-09-30 Today's Date: 06/11/2014    History of Present Illness s/p IM nail humerus, repair of R proximal humerus fracture sustained from fall. Pt presents with h/o falls, L CVA, CHF, chronic kidney disease, dyspnea.   Clinical Impression   Pt admitted with the above diagnoses and presents with below problem list. Pt will benefit from continued acute OT to address the below listed deficits and maximize independence with basic ADLs prior to d/c to next venue. PTA pt ambulated with rw and likely needed min guard to min A for bathing and dressing. Difficult to determine exact level of ADL assistance PTA this session due to decreased cognition. Pt lives with daughter. Attempted to contact daughter to provide shoulder protocal education and determine PLOF. Briefly discussed SNF recommendation with pt with pt indicating she was open to the idea. Will try to f/u again with daughter to determine PLOF, provide shoulder education, and discuss d/c recommendation. Pt completed exercises as detailed below.     Follow Up Recommendations  SNF    Equipment Recommendations  Other (comment) (defer to next venue)    Recommendations for Other Services PT consult     Precautions / Restrictions Precautions Precautions: Shoulder Type of Shoulder Precautions: NWB Shoulder Interventions: Shoulder sling/immobilizer Precaution Comments: reviewed NWB status, keeping arm in sling Required Braces or Orthoses: Sling Restrictions Weight Bearing Restrictions: Yes RUE Weight Bearing: Non weight bearing      Mobility Bed Mobility               General bed mobility comments: Pt in recliner  Transfers Overall transfer level: Needs assistance Equipment used: 1 person hand held assist Transfers: Sit to/from Stand Sit to Stand: Mod assist;+2 safety/equipment         General transfer comment: Pt needing cues to  maintain standing position for 1 minute with 1 person hand held assistance or using arm of recliner. Pt noted to have trunk flexion with cues to stand up tall.     Balance Overall balance assessment: Needs assistance Sitting-balance support: Single extremity supported;Feet supported Sitting balance-Leahy Scale: Fair     Standing balance support: Bilateral upper extremity supported;During functional activity Standing balance-Leahy Scale: Poor Standing balance comment: needs assistance to stand and cues to remain standing                            ADL Overall ADL's : Needs assistance/impaired Eating/Feeding: Set up;Sitting   Grooming: Set up;Sitting;Cueing for UE precautions   Upper Body Bathing: Moderate assistance;Sitting   Lower Body Bathing: +2 for physical assistance;Moderate assistance;Sit to/from stand   Upper Body Dressing : Moderate assistance;Sitting   Lower Body Dressing: +2 for physical assistance;Moderate assistance;Sit to/from stand   Toilet Transfer: Moderate assistance;+2 for physical assistance;Stand-pivot;BSC   Toileting- Clothing Manipulation and Hygiene: Moderate assistance;+2 for physical assistance;Sit to/from stand   Tub/ Engineer, structuralhower Transfer: Moderate assistance;+2 for physical assistance;Stand-pivot;3 in 1     General ADL Comments: Reviewed basic precautions with pt and need for help with mobility and with ADLs. Attempted to contact daughter to provide shoulder protocal education. Briefly discussed SNF recommendation with pt with pt indicating she was open to the idea. Will try to f/u again with daughter to determine PLOF, provide shoulder education, and discuss d/c recommendation.      Vision  Perception     Praxis      Pertinent Vitals/Pain Pain Assessment:  ("not much pain")     Hand Dominance     Extremity/Trunk Assessment Upper Extremity Assessment Upper Extremity Assessment: RUE deficits/detail RUE  Deficits / Details: s/p IM nail R humerus RUE: Unable to fully assess due to immobilization   Lower Extremity Assessment Lower Extremity Assessment: Defer to PT evaluation       Communication Communication Communication: HOH;No difficulties   Cognition Arousal/Alertness: Awake/alert Behavior During Therapy: WFL for tasks assessed/performed;Anxious Overall Cognitive Status: No family/caregiver present to determine baseline cognitive functioning       Memory: Decreased short-term memory;Decreased recall of precautions             General Comments       Exercises Exercises: Hand exercises;Shoulder     Shoulder Instructions Shoulder Instructions ROM for elbow, wrist and digits of operated UE: Minimal assistance Sling wearing schedule (on at all times/off for ADL's): Supervision/safety Proper positioning of operated UE when showering: Minimal assistance    Home Living Family/patient expects to be discharged to:: Private residence Living Arrangements: Children Available Help at Discharge: Family;Other (Comment) (unsure about hours of availability of family assistance)                         Home Equipment: Bedside commode;Shower seat   Additional Comments: Pt stated that she has BSC and a sits in a seat to shower PTA      Prior Functioning/Environment Level of Independence: Needs assistance  Gait / Transfers Assistance Needed: Pt stated she needed help getting into the shower ADL's / Homemaking Assistance Needed: daughter lives with pt and takes care of household tasks; unclear what level of assistance pt needed PTA, likely min guard-min A for bathing and dressing   Comments: pt stated she used a rw PTA    OT Diagnosis: Generalized weakness;Acute pain   OT Problem List: Impaired balance (sitting and/or standing);Decreased cognition;Decreased safety awareness;Decreased knowledge of use of DME or AE;Decreased knowledge of precautions;Pain;Impaired UE  functional use   OT Treatment/Interventions: Self-care/ADL training;Therapeutic exercise;DME and/or AE instruction;Therapeutic activities;Patient/family education;Balance training    OT Goals(Current goals can be found in the care plan section) Acute Rehab OT Goals Patient Stated Goal: not stated OT Goal Formulation: With patient Time For Goal Achievement: 06/18/14 Potential to Achieve Goals: Fair ADL Goals Pt Will Perform Upper Body Bathing: with adaptive equipment;sitting;with min assist Pt Will Perform Upper Body Dressing: with min assist;with adaptive equipment;sitting Pt Will Transfer to Toilet: with min guard assist;ambulating;bedside commode Pt Will Perform Toileting - Clothing Manipulation and hygiene: with min guard assist;sit to/from stand Pt Will Perform Tub/Shower Transfer: with min assist;ambulating;3 in 1 Pt/caregiver will Perform Home Exercise Program: Right Upper extremity;With minimal assist;With written HEP provided  OT Frequency: Min 3X/week   Barriers to D/C:    unsure about level of assitance family can provide       Co-evaluation              End of Session Equipment Utilized During Treatment: Other (comment) (sling)  Activity Tolerance: Patient tolerated treatment well;Other (comment) (anxious to stand) Patient left: in chair;with call bell/phone within reach;with nursing/sitter in room   Time: [phone] OT Time Calculation (min): 43 min Charges:  OT General Charges $OT Visit: 1 Procedure OT Evaluation $Initial OT Evaluation Tier I: 1 Procedure OT Treatments $Self Care/Home Management : 8-22 mins $Therapeutic Exercise: 8-22 mins G-Codes: OT G-codes **NOT  FOR INPATIENT CLASS** Functional Assessment Tool Used: clinical judgement Functional Limitation: Self care Self Care Current Status (Z6109): At least 40 percent but less than 60 percent impaired, limited or restricted Self Care Goal Status (U0454): At least 20 percent but less than 40 percent  impaired, limited or restricted  Pilar Grammes 06/11/2014, 9:52 AM

## 2014-06-11 NOTE — Progress Notes (Signed)
UR completed 

## 2014-06-11 NOTE — Progress Notes (Signed)
   PATIENT ID: Carrie Best   1 Day Post-Op Procedure(s) (LRB): INTRAMEDULLARY (IM) NAIL HUMERAL (Right)  Subjective: In some pain. No more than before surgery. Sitting in chair. No other complaints or concerns.   Objective:  Filed Vitals:   06/11/14 0531  BP: 132/54  Pulse: 84  Temp: 98.5 F (36.9 C)  Resp: 20     R UE dressing with scant dried blood, intact In sling Distally minimal swelling, wiggles fingers and distally NVI  Labs:   Recent Labs  06/10/14 0957 06/11/14 0506  HGB 9.1* 8.4*   Recent Labs  06/10/14 0957 06/11/14 0506  WBC 8.7 10.4  RBC 3.29* 2.97*  HCT 28.5* 25.8*  PLT 270 284   Recent Labs  06/10/14 0957 06/11/14 0506  NA 136* 134*  K 3.7 4.1  CL 99 96  CO2 26 25  BUN 14 14  CREATININE 0.98 1.02  GLUCOSE 134* 165*  CALCIUM 9.4 9.1    Assessment and Plan: 1 day s/p ORIF right prox humerus fx with IM nail  OT/PT today Possible d/c to SNF per OT/PT recommendation, hopefully can just send home with some help from daughter ABLA- expected post op, will continue to monitor Sling Minimize pain rx, norco is ideal for now  VTE proph: SCDs, resume Xalerto in 2 days

## 2014-06-12 DIAGNOSIS — S42201A Unspecified fracture of upper end of right humerus, initial encounter for closed fracture: Secondary | ICD-10-CM | POA: Diagnosis not present

## 2014-06-12 LAB — CBC
HCT: 27.8 % — ABNORMAL LOW (ref 36.0–46.0)
Hemoglobin: 9 g/dL — ABNORMAL LOW (ref 12.0–15.0)
MCH: 28.3 pg (ref 26.0–34.0)
MCHC: 32.4 g/dL (ref 30.0–36.0)
MCV: 87.4 fL (ref 78.0–100.0)
Platelets: 247 10*3/uL (ref 150–400)
RBC: 3.18 MIL/uL — AB (ref 3.87–5.11)
RDW: 18.3 % — ABNORMAL HIGH (ref 11.5–15.5)
WBC: 11.4 10*3/uL — ABNORMAL HIGH (ref 4.0–10.5)

## 2014-06-12 MED ORDER — HYDROCODONE-ACETAMINOPHEN 5-325 MG PO TABS: 2.0000 | ORAL_TABLET | ORAL | Status: DC | PRN

## 2014-06-12 MED ORDER — MAGNESIUM CITRATE PO SOLN
1.0000 | Freq: Once | ORAL | Status: AC
  Administered 2014-06-12: 1 via ORAL
  Filled 2014-06-12: qty 296

## 2014-06-12 MED ORDER — POLYETHYLENE GLYCOL 3350 17 G PO PACK: 17.0000 g | PACK | ORAL | Status: DC | PRN

## 2014-06-12 MED ORDER — BISACODYL 10 MG RE SUPP
10.0000 mg | Freq: Every day | RECTAL | Status: DC | PRN
  Administered 2014-06-13: 10 mg via RECTAL
  Filled 2014-06-12: qty 1

## 2014-06-12 NOTE — Progress Notes (Signed)
[facility].  Since her granddaughter was here and familiar with her living conditions, I asked the patient about what she had told me on 06/10/14, about her daughter, and caregiver, Suzi not being there all the time to care for her.  She proceeded to tell us that she gets angry with her and yells at her, she will also leave her alone at the residence without anyone there to help her so that she does not fall.  She reported to me that she has had numerous falls since her stroke in 2013 and falls recently before this hospitalization.  She states she stays in bed when Suzi is gone because there is no one there to help her.  I asked how she cared for herself as far as food and daily care, she stated that she does not always eat because no one is there to fix her food and/or to help her to the kitchen so that she can eat.  She will wear a depends with an extra pad so that she does not have to get up during the night. Patient became upset and requested that we not tell Suzi.    Granddaughter did state to me that her Jessee Aversunt Suzi cooks twice a week and that she has been there when her grandmother has been up without assistance of the walker and told her Celine Ahrunt Suzi to remind her to get her walker. She also confirmed that fact that her Aunt will leave the patient at home alone.  Granddaughter tried to assist me with [facility] and it was very difficult.  It is very apparent that the patient has grown weaker and is unable to assist much in movement when only a few inches is required.  I have had problems getting information concerning her mobility at home vs her mobility here, granddaughter did seem to want to answer questions about what her mobility was at home prior to being admitted here.    All of these problems seem to be an issue and of concern in the well being and the total care of this patient.  I will pass this  information along to the Child psychotherapistocial Worker.

## 2014-06-12 NOTE — Progress Notes (Signed)
   PATIENT ID: Carrie Best   2 Days Post-Op Procedure(s) (LRB): INTRAMEDULLARY (IM) NAIL HUMERAL (Right)  Subjective: Sore in right shoulder. Tells me she had trouble sleeping last night but not due to pain, just "tired and couldn't sleep". No other complaints.   Objective:  Filed Vitals:   06/12/14 0514  BP: 123/66  Pulse: 70  Temp: 98.4 F (36.9 C)  Resp: 16     R UE dressing with scant dried blood, intact  In sling  Distally minimal swelling, wiggles fingers and distally NVI  Labs:   Recent Labs  06/10/14 0957 06/11/14 0506  HGB 9.1* 8.4*   Recent Labs  06/10/14 0957 06/11/14 0506  WBC 8.7 10.4  RBC 3.29* 2.97*  HCT 28.5* 25.8*  PLT 270 284   Recent Labs  06/10/14 0957 06/11/14 0506  NA 136* 134*  K 3.7 4.1  CL 99 96  CO2 26 25  BUN 14 14  CREATININE 0.98 1.02  GLUCOSE 134* 165*  CALCIUM 9.4 9.1    Assessment and Plan: 2 day s/p ORIF right prox humerus fx with IM nail  OT/PT today  Likely d/c to SNF per OT/PT recommendation when bed available, FL2 signed in chart  ABLA- expected post op, will continue to monitor  Sling  Minimize pain rx, norco is ideal for now   VTE proph: SCDs, resume Xalerto in tomorrow

## 2014-06-12 NOTE — Discharge Instructions (Signed)
Discharge Instructions after Open Shoulder Repair  A sling has been provided for you. Remain in your sling at all times. This includes sleeping in your sling.  Use ice on the shoulder intermittently over the first 48 hours after surgery.  Pain medicine has been prescribed for you.  Use your medicine liberally over the first 48 hours, and then you can begin to taper your use. You may take Extra Strength Tylenol or Tylenol only in place of the pain pills. DO NOT take ANY nonsteroidal anti-inflammatory pain medications: Advil, Motrin, Ibuprofen, Aleve, Naproxen or Naprosyn.  You may remove your dressing after two days  You may shower 5 days after surgery. The incisions CANNOT get wet prior to 5 days. Simply allow the water to wash over the site and then pat dry. Do not rub the incisions. Make sure your axilla (armpit) is completely dry after showering. Keep steri strips on over incisions.  Take one aspirin, a day for 2 weeks after surgery, unless you have an aspirin sensitivity/ allergy or asthma.   Please call [phone] during normal business hours or [phone] after hours for any problems. Including the following:  - excessive redness of the incisions - drainage for more than 4 days - fever of more than 101.5 F  *Please note that pain medications will not be refilled after hours or on weekends.

## 2014-06-12 NOTE — Progress Notes (Signed)
Occupational Therapy Treatment Patient Details Name: Carrie Best MRN: [id number] DOB: 1928-07-01 Today's Date: 06/12/2014    History of present illness s/p IM nail humerus, repair of R proximal humerus fracture sustained from fall. Pt presents with h/o falls, L CVA, CHF, chronic kidney disease, dyspnea.   OT comments  Pt seen for acute OT session. Pt performed exercises as detailed below. ADL and sling education reviewed. Shoulder protocol handout provided.  Discussed d/c plan with pt and need for 24 hour assistance. Discussed use of BSC by pt's bed at home and having someone by her when she is OOB/mobility.   Follow Up Recommendations  Supervision/Assistance - 24 hour;Home health OT (daughter-in-law is a Engineer, civil (consulting)nurse and will provide 24 hour assist)    Equipment Recommendations  None recommended by OT;Other (comment) (pt has 3n1)    Recommendations for Other Services      Precautions / Restrictions Precautions Precautions: Shoulder Type of Shoulder Precautions: NWB Shoulder Interventions: Shoulder sling/immobilizer Precaution Booklet Issued: Yes (comment) Precaution Comments: reviewed precautions Required Braces or Orthoses: Sling Restrictions Weight Bearing Restrictions: Yes RUE Weight Bearing: Non weight bearing       Mobility Bed Mobility               General bed mobility comments: Pt in recliner  Transfers Overall transfer level: Needs assistance Equipment used: 1 person hand held assist Transfers: Sit to/from Stand Sit to Stand: Mod assist         General transfer comment: mod A +1 to power up to standing; min A needed for balance in standing and ambulation    Balance Overall balance assessment: Needs assistance;History of Falls Sitting-balance support: Single extremity supported;Feet supported Sitting balance-Leahy Scale: Fair     Standing balance support: Single extremity supported;During functional activity Standing balance-Leahy Scale:  Poor Standing balance comment: needs assistance to stand and ambulate; pt with unsteady gait                   ADL Overall ADL's : Needs assistance/impaired                                     Functional mobility during ADLs: Minimal assistance (1 person hand held assist, 3 steps forward and backwards) General ADL Comments: Pt with unsteady balance in standing. Walked 3 steps forward and backwards with min A. Educated pt on use of BSC by bed, over toilet and in shower. Per CM, nurse, and pt, pt iis planning to d/c home tomorrow with daughter-in-law providing 24 hour assistance/supervision. No family present during OT treatment. Daughter-in-law is a Engineer, civil (consulting)nurse. Educated pt on sitting for bathing/dressing and to have someone beside her when she is OOB/mobility.      Vision                     Perception     Praxis      Cognition   Behavior During Therapy: [facility] for tasks assessed/performed Overall Cognitive Status: No family/caregiver present to determine baseline cognitive functioning       Memory: Decreased short-term memory               Extremity/Trunk Assessment               Exercises Shoulder Exercises Elbow Flexion: AAROM;Right;10 reps;Supine Elbow Extension: AAROM;Right;10 reps;Supine Wrist Flexion: AAROM;Right;10 reps;Seated Wrist Extension: Right;10 reps;Seated;AAROM Digit Composite Flexion: AROM;Right;10 reps;Seated Composite Extension: AROM;Right;10  reps;Seated   Shoulder Instructions       General Comments      Pertinent Vitals/ Pain       Pain Assessment: 0-10 Pain Score: 8  Pain Location: R shoulder Pain Descriptors / Indicators: Aching Pain Intervention(s): Limited activity within patient's tolerance;Monitored during session;Repositioned;Ice applied;Patient requesting pain meds-RN notified  Home Living                                          Prior Functioning/Environment               Frequency Min 3X/week     Progress Toward Goals  OT Goals(current goals can now be found in the care plan section)  Progress towards OT goals: Progressing toward goals  Acute Rehab OT Goals OT Goal Formulation: With patient Time For Goal Achievement: 06/18/14 Potential to Achieve Goals: Fair ADL Goals Pt Will Perform Upper Body Bathing: with adaptive equipment;sitting;with min assist Pt Will Perform Upper Body Dressing: with min assist;with adaptive equipment;sitting Pt Will Transfer to Toilet: with min guard assist;ambulating;bedside commode Pt Will Perform Toileting - Clothing Manipulation and hygiene: with min guard assist;sit to/from stand Pt Will Perform Tub/Shower Transfer: with min assist;ambulating;3 in 1 Pt/caregiver will Perform Home Exercise Program: Right Upper extremity;With minimal assist;With written HEP provided  Plan Discharge plan needs to be updated    Co-evaluation                 End of Session Equipment Utilized During Treatment: Gait belt;Other (comment) (sling)   Activity Tolerance Patient tolerated treatment well;Other (comment)   Patient Left in chair;with call bell/phone within reach   Nurse Communication Patient requests pain meds;Mobility status    Functional Assessment Tool Used: clinical judgement Functional Limitation: Self care Self Care Current Status (Z6109): At least 40 percent but less than 60 percent impaired, limited or restricted Self Care Goal Status (U0454): At least 20 percent but less than 40 percent impaired, limited or restricted   Time: 1428-1455 OT Time Calculation (min): 27 min  Charges: OT G-codes **NOT FOR INPATIENT CLASS** Functional Assessment Tool Used: clinical judgement Functional Limitation: Self care Self Care Current Status (U9811): At least 40 percent but less than 60 percent impaired, limited or restricted Self Care Goal Status (B1478): At least 20 percent but less than 40 percent impaired, limited or  restricted OT General Charges $OT Visit: 1 Procedure OT Treatments $Self Care/Home Management : 8-22 mins $Therapeutic Exercise: 8-22 mins  Pilar Grammes 06/12/2014, 3:10 PM

## 2014-06-13 DIAGNOSIS — S42201A Unspecified fracture of upper end of right humerus, initial encounter for closed fracture: Secondary | ICD-10-CM | POA: Diagnosis not present

## 2014-06-13 NOTE — Progress Notes (Signed)
Occupational Therapy Treatment Patient Details Name: Carrie [name] MRN: [id number] DOB: 28-Feb-1928 Today's Date: 06/13/2014    History of present illness s/p IM nail humerus, repair of R proximal humerus fracture sustained from fall. Pt presents with h/o falls, L CVA, CHF, chronic kidney disease, dyspnea.   OT comments  Pt limited by lethargy this date. OT attempted x3 to visit when family present for education and training, however unable to successfully coordinate. Pt with poor recall of UE exercises and performed with pt for increased recall. OT will provide handout to pt's family regarding shoulder protocol and UE exercises.   Follow Up Recommendations  Supervision/Assistance - 24 hour;Home health OT    Equipment Recommendations  None recommended by OT;Other (comment)    Recommendations for Other Services      Precautions / Restrictions Precautions Precautions: Shoulder Type of Shoulder Precautions: NWB Shoulder Interventions: Shoulder sling/immobilizer Precaution Booklet Issued: Yes (comment) Precaution Comments: reviewed precautions Required Braces or Orthoses: Sling Restrictions Weight Bearing Restrictions: Yes RUE Weight Bearing: Non weight bearing       Mobility Bed Mobility     General bed mobility comments: Pt sitting up in recliner.   Transfers         General transfer comment: Not completed at this time.         ADL Overall ADL's : Needs assistance/impaired                                       General ADL Comments: Attempted to see pt with family today, however was unable to coordinate. Pt performed therapeutic exercises for elbow, wrist, and hand with min guard for assistance. Pt had no recall of UE exercises. D/C plan is home, and OT will type information regarding shoulder protocol and E,W, Hand exercises for pt's daughter in law.                 Cognition  Arousal/Alertness: Lethargic, suspect due to medications.   Behavior During Therapy: WFL for tasks assessed/performed Overall Cognitive Status: No family/caregiver present to determine baseline cognitive functioning       Memory: Decreased short-term memory                 Exercises Shoulder Exercises Elbow Flexion: AAROM;Right;10 reps;Supine Elbow Extension: AAROM;Right;10 reps;Supine Wrist Flexion: AROM;Right;10 reps;Seated Wrist Extension: AROM;Right;10 reps;Seated Digit Composite Flexion: AROM;Right;10 reps;Seated Composite Extension: AROM;Right;10 reps;Seated           Pertinent Vitals/ Pain       Pain Assessment: 0-10 Pain Score: 5  Faces Pain Scale: Hurts even more Pain Location: R shoulder Pain Descriptors / Indicators: Aching Pain Intervention(s): Limited activity within patient's tolerance;Monitored during session;Repositioned         Frequency Min 3X/week     Progress Toward Goals  OT Goals(current goals can now be found in the care plan section)  Progress towards OT goals: Progressing toward goals  Acute Rehab OT Goals Patient Stated Goal: home today OT Goal Formulation: With patient Time For Goal Achievement: 06/18/14 Potential to Achieve Goals: Fair ADL Goals Pt Will Perform Upper Body Bathing: with adaptive equipment;sitting;with min assist Pt Will Perform Upper Body Dressing: with min assist;with adaptive equipment;sitting Pt Will Transfer to Toilet: with min guard assist;ambulating;bedside commode Pt Will Perform Toileting - Clothing Manipulation and hygiene: with min guard assist;sit to/from stand Pt Will Perform Tub/Shower Transfer: with min assist;ambulating;3 in 1 Pt/caregiver  will Perform Home Exercise Program: Right Upper extremity;With minimal assist;With written HEP provided  Plan Discharge plan remains appropriate       End of Session Equipment Utilized During Treatment: Other (comment) (sling)   Activity Tolerance Patient limited by lethargy   Patient Left in chair;with call bell/phone  within reach   Nurse Communication Other (comment) (Will provide handout for daughter in law)    Functional Assessment Tool Used: clinical judgement Functional Limitation: Self care Self Care Current Status (U9811(G8987): At least 40 percent but less than 60 percent impaired, limited or restricted Self Care Goal Status (B1478(G8988): At least 20 percent but less than 40 percent impaired, limited or restricted   Time: 1650-1704 OT Time Calculation (min): 14 min  Charges: OT G-codes **NOT FOR INPATIENT CLASS** Functional Assessment Tool Used: clinical judgement Functional Limitation: Self care Self Care Current Status (G9562(G8987): At least 40 percent but less than 60 percent impaired, limited or restricted Self Care Goal Status (Z3086(G8988): At least 20 percent but less than 40 percent impaired, limited or restricted OT General Charges $OT Visit: 1 Procedure OT Treatments $Therapeutic Exercise: 8-22 mins  Rae LipsMiller, Nicodemus Denk M 06/13/2014, 5:10 PM  Carney LivingLeeAnn Marie Yuritza Paulhus, OTR/L Occupational Therapist [phone] (pager)

## 2014-06-13 NOTE — Progress Notes (Signed)
   PATIENT ID: Carrie [name]   3 Days Post-Op Procedure(s) (LRB): INTRAMEDULLARY (IM) NAIL HUMERAL (Right)  Subjective: No new c/o. She had a small BM yesterday after MagCitrate and had a suppository placed this am.   Objective:  Filed Vitals:   06/13/14 0533  BP: 143/67  Pulse: 71  Temp: 98.3 F (36.8 C)  Resp: 16     R shoulder dressings with small dried blood.  Sling intact, NVID  Labs:   Recent Labs  06/10/14 0957 06/11/14 0506 06/12/14 0904  HGB 9.1* 8.4* 9.0*   Recent Labs  06/11/14 0506 06/12/14 0904  WBC 10.4 11.4*  RBC 2.97* 3.18*  HCT 25.8* 27.8*  PLT 284 247   Recent Labs  06/10/14 0957 06/11/14 0506  NA 136* 134*  K 3.7 4.1  CL 99 96  CO2 26 25  BUN 14 14  CREATININE 0.98 1.02  GLUCOSE 134* 165*  CALCIUM 9.4 9.1    Assessment and Plan: POD#3 s/p IMN R prox hum fx D/c home today with family Sling F/u 10-14 days Resume Xarelto today   VTE proph: SCD, resume Xarelto

## 2014-06-13 NOTE — Progress Notes (Signed)
RN  In to assist pt with breakfast. Pt very lethargic but arousable. Pt BP 155/73 p 89 resp 14 shallow but unlabored. Pt sats 82% RA. Oxygen at 2 lpm via [place] applied and oxygen level quickly came up to 96%. Pt has had 2 incontinent stools this AM. Pt is able to follow commands between falling asleep. Neuro patient is intact and is able to state who she is, where she and the day. Rapid response RN called in to assess pt.  Pt found to have had oxycodone and ambien on night shift. Pt placed on continuous pulse. Rept to [employer]le Laliberte PA. She will relay pt's condition to Dr. Ave Filterhandler. Will continue to monitor.

## 2014-06-13 NOTE — Progress Notes (Signed)
Physical Therapy Treatment Patient Details Name: Carrie DikeFrances A [name] MRN: [id number] DOB: September 27, 1927 Today's Date: 06/13/2014    History of Present Illness s/p IM nail humerus, repair of R proximal humerus fracture sustained from fall. Pt presents with h/o falls, L CVA, CHF, chronic kidney disease, dyspnea.    PT Comments    Patient able to walk some in the room this afternoon. Patient was incontinent of bowel upon standing and required increased time for pericare and assistance. Patient is now planning to DC back home with two daughters. Recommend HHPT follow up  Follow Up Recommendations  Home health PT;Supervision/Assistance - 24 hour     Equipment Recommendations       Recommendations for Other Services       Precautions / Restrictions Precautions Precautions: Shoulder Type of Shoulder Precautions: NWB Shoulder Interventions: Shoulder sling/immobilizer Precaution Comments: reviewed precautions Required Braces or Orthoses: Sling Restrictions RUE Weight Bearing: Non weight bearing    Mobility  Bed Mobility Overal bed mobility: Needs Assistance Bed Mobility: Supine to Sit     Supine to sit: Min assist     General bed mobility comments: Min A for trunk  Transfers Overall transfer level: Needs assistance Equipment used: 1 person hand held assist   Sit to Stand: Mod assist         General transfer comment: mod A +1 to power up to standing; min A needed for balance in standing  Ambulation/Gait Ambulation/Gait assistance: Mod assist;+2 safety/equipment Ambulation Distance (Feet): 15 Feet Assistive device: 1 person hand held assist Gait Pattern/deviations: Step-to pattern;Narrow base of support;Shuffle   Gait velocity interpretation: Below normal speed for age/gender General Gait Details: A for balance and stability. Cues to increase step length. Chair to follow for safety   Stairs            Wheelchair Mobility    Modified Rankin (Stroke Patients  Only)       Balance                                    Cognition Arousal/Alertness: Awake/alert Behavior During Therapy: WFL for tasks assessed/performed Overall Cognitive Status: No family/caregiver present to determine baseline cognitive functioning       Memory: Decreased short-term memory              Exercises      General Comments        Pertinent Vitals/Pain Faces Pain Scale: Hurts even more Pain Location: R shoulder Pain Descriptors / Indicators: Aching Pain Intervention(s): Monitored during session    Home Living                      Prior Function            PT Goals (current goals can now be found in the care plan section) Progress towards PT goals: Progressing toward goals    Frequency  Min 3X/week    PT Plan Discharge plan needs to be updated    Co-evaluation             End of Session   Activity Tolerance: Patient tolerated treatment well Patient left: in chair;with call bell/phone within reach;with chair alarm set     Time: 1344-1413 PT Time Calculation (min): 29 min  Charges:  $Gait Training: 8-22 mins $Therapeutic Activity: 8-22 mins  G Codes:      Fredrich Birks 06/13/2014, 2:50 PM 06/13/2014 Fredrich Birks PTA [phone] pager [phone] office

## 2014-06-13 NOTE — Care Management Note (Signed)
CARE MANAGEMENT NOTE 06/13/2014  Patient:  Carrie [name],Carrie [name]   Account Number:  [id number]  Date Initiated:  06/13/2014  Documentation initiated by:  Nehemie Casserly  Subjective/Objective Assessment:   80 yr old female admitted with right proximal humerus fracture, underwent Right humerus IM Nailing.     Action/Plan:   Case manager spoke with patient and daughters concerning home health needs. Choice offered. Daughter Deborah  wants [facility]. CM called referral to Wanda @ [facility]. Faxed orders to [phone].   Anticipated DC Date:  06/13/2014   Anticipated DC Plan:  HOME W HOME HEALTH SERVICES      DC Planning Services  CM consult      PAC Choice  HOME HEALTH   Choice offered to / List presented to:  C-4 Adult Children        HH arranged  HH-3 OT  HH-2 PT      HH agency  [facility]   Status of service:  Completed, signed off Medicare Important Message given?   (If response is "NO", the following Medicare IM given date fields will be blank) Date Medicare IM given:   Medicare IM given by:   Date Additional Medicare IM given:   Additional Medicare IM given by:    Discharge Disposition:  HOME W HOME HEALTH SERVICES  Per UR Regulation:  Reviewed for med. necessity/level of care/duration of stay  

## 2014-06-13 NOTE — Progress Notes (Signed)
Granddaughter in feeding pt. Pt awake and talking. Pt was able to take AM meds. Will continue to monitor.

## 2014-06-13 NOTE — Discharge Summary (Signed)
Patient ID: Carrie Carrie [name] MRN: [id number] DOB/AGE: 78-Apr-1929 78 y.o.  Admit date: 06/10/2014 Discharge date: 06/13/2014  Admission Diagnoses:  Active Problems:   Proximal humerus fracture   Discharge Diagnoses:  Same  Past Medical History  Diagnosis Date  . Hypertension   . Arthritis   . Diastolic dysfunction   . Hyperlipidemia   . Congestive heart failure     Congestive heart failure symptoms  . Heart murmur   . Dysrhythmia 05-10-12    hx. Carrie [name]. Fib., rate is controlled-tx. Xarelto  . Pneumonia 05-10-12    dx. in July- tx. antibiotics  . Hearing loss 05-10-12    bilateral hearing aids.  Marland Kitchen. Dyspnea 05-10-12    recent 2-3 weeks hospital visit 7'13 pneumonia.  Marland Kitchen. GERD (gastroesophageal reflux disease) 05-10-12    tx. with meds as needed  . Chronic kidney disease 05-10-12    hx. recent kidney stones passed.  . Depression   . Stroke 05-10-12    Lt.CVA note on scans-hx. freq. falls/ none past 6 months 01/21/14    Surgeries: Procedure(s): INTRAMEDULLARY (IM) NAIL HUMERAL on 06/10/2014   Consultants:    Discharged Condition: Improved  Hospital Course: Carrie Carrie [name] [place] is an 80 y.o. female who was admitted 06/10/2014 for operative treatment of R proximal humerus fracture. Patient has severe unremitting pain that affects sleep, daily activities, and work/hobbies. The patient was taken to the operating room on 06/10/2014 and underwent  Procedure(s): INTRAMEDULLARY (IM) NAIL HUMERAL.    Patient was given perioperative antibiotics: Anti-infectives   Start     Dose/Rate Route Frequency Ordered Stop   06/10/14 1830  clindamycin (CLEOCIN) IVPB 600 mg     600 mg 100 mL/hr over 30 Minutes Intravenous Every 6 hours 06/10/14 1809 06/11/14 1245   06/10/14 0600  clindamycin (CLEOCIN) IVPB 900 mg     900 mg 100 mL/hr over 30 Minutes Intravenous On call to O.R. 06/09/14 1253 06/10/14 1207       Patient was given sequential compression devices, early ambulation,  to prevent DVT.  Here xarelto was  held postoperatively due to bleeding risk and was restarted upon discharge.  She worked with PT/OT and ultimately was felt to be appropriate for d/c home with 24 hr supervision at home with family.  Patient benefited maximally from hospital stay and there were no complications.    Recent vital signs: Patient Vitals for the past 24 hrs:  BP Temp Pulse Resp SpO2  06/13/14 0533 143/67 mmHg 98.3 F (36.8 C) 71 16 90 %  06/12/14 2157 131/60 mmHg 98.3 F (36.8 C) 68 16 96 %  06/12/14 1500 132/77 mmHg 98.1 F (36.7 C) 82 17 95 %     Recent laboratory studies:  Recent Labs  06/10/14 0957 06/11/14 0506 06/12/14 0904  WBC 8.7 10.4 11.4*  HGB 9.1* 8.4* 9.0*  HCT 28.5* 25.8* 27.8*  PLT 270 284 247  NA 136* 134*  --   K 3.7 4.1  --   CL 99 96  --   CO2 26 25  --   BUN 14 14  --   CREATININE 0.98 1.02  --   GLUCOSE 134* 165*  --   CALCIUM 9.4 9.1  --      Discharge Medications:     Medication List    STOP taking these medications       TYLENOL ARTHRITIS PAIN 650 MG CR tablet  Generic drug:  acetaminophen      TAKE these medications  atorvastatin 20 MG tablet  Commonly known as:  LIPITOR  Take 1 tablet (20 mg total) by mouth at bedtime.     diltiazem 180 MG 24 hr capsule  Commonly known as:  DILACOR XR  Take 180 mg by mouth every morning.     DULoxetine 60 MG capsule  Commonly known as:  CYMBALTA  Take 60 mg by mouth every morning.     fish oil-omega-3 fatty acids 1000 MG capsule  Take 1 g by mouth daily.     furosemide 40 MG tablet  Commonly known as:  LASIX  Take 40 mg by mouth daily.     HYDROcodone-acetaminophen 5-325 MG per tablet  Commonly known as:  NORCO  Take 2 tablets by mouth every 4 (four) hours as needed.     LORazepam 0.5 MG tablet  Commonly known as:  ATIVAN  Take 0.5 mg by mouth every 8 (eight) hours as needed for anxiety.     metoprolol tartrate 25 MG tablet  Commonly known as:  LOPRESSOR  Take 50 mg by mouth 2 (two) times daily.      ondansetron 8 MG disintegrating tablet  Commonly known as:  ZOFRAN-ODT  Take 1 tablet (8 mg total) by mouth every 12 (twelve) hours as needed for nausea.     potassium chloride SA 20 MEQ tablet  Commonly known as:  K-DUR,KLOR-CON  Take 1 tablet (20 mEq total) by mouth daily.     ranitidine 150 MG capsule  Commonly known as:  ZANTAC  Take 150 mg by mouth daily as needed. Reflux     senna-docusate 8.6-50 MG per tablet  Commonly known as:  Senokot-S  Take 1 tablet by mouth 2 (two) times daily. While taking pain meds to prevent constipation     traMADol 50 MG tablet  Commonly known as:  ULTRAM  Take 1 tablet (50 mg total) by mouth every 6 (six) hours as needed for moderate pain. Post-operatively     XARELTO 20 MG Tabs tablet  Generic drug:  rivaroxaban  Take 20 mg by mouth every morning.     zolpidem 10 MG tablet  Commonly known as:  AMBIEN  Take 10 mg by mouth at bedtime as needed. For sleep        Diagnostic Studies: Dg Shoulder Right  05/31/2014   CLINICAL DATA:  Right shoulder pain, limited range of motion  EXAM: RIGHT SHOULDER - 2+ VIEW  COMPARISON:  None.  FINDINGS: There is Carrie [name] displaced fracture of the surgical neck of the right proximal humerus. There is no dislocation. The acromioclavicular joint is congruent.  IMPRESSION: Displaced fracture of the surgical neck of the right proximal humerus.   Electronically Signed   By: Elige Ko   On: 05/31/2014 22:37   Dg Humerus Right  06/10/2014   CLINICAL DATA:  Patient with proximal right humerus fracture.  EXAM: RIGHT HUMERUS - 2+ VIEW; DG C-ARM 61-120 MIN  COMPARISON:  Radiograph 05/31/2014  FINDINGS: Patient status post intra medullary rod and screw fixation of proximal right humerus fracture.  IMPRESSION: Patient status post ORIF proximal right humerus fracture.   Electronically Signed   By: Annia Belt M.D.   On: 06/10/2014 15:30   Dg C-arm 1-60 Min  06/10/2014   CLINICAL DATA:  Patient with proximal right humerus  fracture.  EXAM: RIGHT HUMERUS - 2+ VIEW; DG C-ARM 61-120 MIN  COMPARISON:  Radiograph 05/31/2014  FINDINGS: Patient status post intra medullary rod and screw fixation of proximal right humerus  fracture.  IMPRESSION: Patient status post ORIF proximal right humerus fracture.   Electronically Signed   By: Annia Belt M.D.   On: 06/10/2014 15:30    Disposition: 01-Home or Self Care      Discharge Instructions   Call MD / Call 911    Complete by:  As directed   If you experience chest pain or shortness of breath, CALL 911 and be transported to the hospital emergency room.  If you develope Carrie [name] fever above 101 F, pus (white drainage) or increased drainage or redness at the wound, or calf pain, call your surgeon's office.     Constipation Prevention    Complete by:  As directed   Drink plenty of fluids.  Prune juice may be helpful.  You may use Carrie [name] stool softener, such as Colace (over the counter) 100 mg twice Carrie [name] day.  Use MiraLax (over the counter) for constipation as needed.     Diet - low sodium heart healthy    Complete by:  As directed      Driving restrictions    Complete by:  As directed   No driving for 6 weeks     Increase activity slowly as tolerated    Complete by:  As directed            Follow-up Information   Follow up with Mable Paris, MD In 10 days.   Specialty:  Orthopedic Surgery   Contact information:   [address] [place] [address] [phone]        Signed: Mable Paris 06/13/2014, 7:31 AM

## 2014-06-13 NOTE — Clinical Social Work Note (Signed)
CSW spoke with pt (06/12/2014) in regards to home situation. Pt reports she lives at home with daughter and granddaughter.  Pt reports no fear of returning home and is expresses enthusiasm in regards to not "having to go to" a skilled facility at this time for STR.  Pt states she was at a SNF prior and fell numerous times while in their care.  CSW requested to speak to pt'd daughter regarding supervision and care once dc'd.  Pt is agreeable.  CSW spoke with pt daughter [name] as well as the RN who will be providing paid assistance to the pt once she returns home.  Daughter, granddaughter and RN state that pt will have supervision and care 24hrs per day once dc'd.  CSW has observed the pt interact with daughter, granddaughter and RN at bedside.  All interactions have appeared sincere and supportive.   Of report, pt has been confused and often only oriented to self.  Per chart review and discussion with RN, pt does not appear to have signs of malnutrition or physical signs of neglect abuse.  Pt reports receiving support and care from family.    CSW signing off.  No further intervention required at this time.  RN and RNCM updated and in agreement.  Carrie [name], LCSWA [phone]  Psychiatric & Orthopedics (5N 1-16) Clinical Social Worker

## 2014-06-13 NOTE — Care Management Note (Signed)
CARE MANAGEMENT NOTE 06/13/2014  Patient:  Carrie [name],Carrie [name]   Account Number:  [id number]  Date Initiated:  06/13/2014  Documentation initiated by:  Vance PeperBRADY,Dossie Swor  Subjective/Objective Assessment:   80 yr old female admitted with right proximal humerus fracture, underwent Right humerus IM Nailing.     Action/Plan:   Case manager spoke with patient and daughters concerning home health needs. Choice offered. D[facility] [place]Deborah  [facility]. [facility] Faxed orders to [phone].   Anticipated DC Date:  06/13/2014   Anticipated DC Plan:  HOME W HOME HEALTH SERVICES      DC Planning Services  CM consult      [facility]   Choice offered to / List presented to:  C-4 Adult Children        HH arranged  HH-3 OT  HH-2 PT      [facility]   Status of service:  Completed, signed off Medicare Important Message given?   (If response is "NO", the following Medicare IM given date fields will be blank) Date Medicare IM given:   Medicare IM given by:   Date Additional Medicare IM given:   Additional Medicare IM given by:    Discharge Disposition:  HOME W HOME HEALTH SERVICES  Per UR Regulation:  Reviewed for med. necessity/level of care/duration of stay

## 2014-06-13 NOTE — Progress Notes (Signed)
Dr. Ave Filterhandler in to see pt. Pt opens eyes and speaks to us as we enter the room. Pt sats 98% 2 lpm via nasal cannula. Pt is still sleepy but a little more responsive. Will continue to monitor.

## 2014-06-17 ENCOUNTER — Encounter (HOSPITAL_COMMUNITY): Payer: Self-pay | Admitting: Orthopedic Surgery

## 2014-09-04 ENCOUNTER — Other Ambulatory Visit: Payer: Self-pay | Admitting: Orthopedic Surgery

## 2014-09-04 ENCOUNTER — Encounter (HOSPITAL_BASED_OUTPATIENT_CLINIC_OR_DEPARTMENT_OTHER): Payer: Self-pay | Admitting: *Deleted

## 2014-09-04 NOTE — Progress Notes (Signed)
Had surgery 10/15-did well-was ok for surgery dr Berdie Ogrennasher-will need istat

## 2014-09-09 ENCOUNTER — Ambulatory Visit (HOSPITAL_BASED_OUTPATIENT_CLINIC_OR_DEPARTMENT_OTHER): Payer: Medicare Other | Admitting: Anesthesiology

## 2014-09-09 ENCOUNTER — Encounter (HOSPITAL_BASED_OUTPATIENT_CLINIC_OR_DEPARTMENT_OTHER)
Admission: RE | Disposition: A | Discharge status: Home / Self Care | Payer: Self-pay | Source: Ambulatory Visit | Attending: Orthopedic Surgery

## 2014-09-09 ENCOUNTER — Ambulatory Visit (HOSPITAL_BASED_OUTPATIENT_CLINIC_OR_DEPARTMENT_OTHER)
Admission: RE | Admit: 2014-09-09 | Discharge: 2014-09-09 | Disposition: A | Discharge status: Home / Self Care | Payer: Medicare Other | Source: Ambulatory Visit | Attending: Orthopedic Surgery | Admitting: Orthopedic Surgery

## 2014-09-09 ENCOUNTER — Encounter (HOSPITAL_BASED_OUTPATIENT_CLINIC_OR_DEPARTMENT_OTHER): Payer: Self-pay | Admitting: Anesthesiology

## 2014-09-09 DIAGNOSIS — I509 Heart failure, unspecified: Secondary | ICD-10-CM | POA: Insufficient documentation

## 2014-09-09 DIAGNOSIS — Z8673 Personal history of transient ischemic attack (TIA), and cerebral infarction without residual deficits: Secondary | ICD-10-CM | POA: Diagnosis not present

## 2014-09-09 DIAGNOSIS — F329 Major depressive disorder, single episode, unspecified: Secondary | ICD-10-CM | POA: Diagnosis not present

## 2014-09-09 DIAGNOSIS — Z8701 Personal history of pneumonia (recurrent): Secondary | ICD-10-CM | POA: Insufficient documentation

## 2014-09-09 DIAGNOSIS — M1990 Unspecified osteoarthritis, unspecified site: Secondary | ICD-10-CM | POA: Insufficient documentation

## 2014-09-09 DIAGNOSIS — K219 Gastro-esophageal reflux disease without esophagitis: Secondary | ICD-10-CM | POA: Diagnosis not present

## 2014-09-09 DIAGNOSIS — T8484XA Pain due to internal orthopedic prosthetic devices, implants and grafts, initial encounter: Secondary | ICD-10-CM | POA: Insufficient documentation

## 2014-09-09 DIAGNOSIS — Z79899 Other long term (current) drug therapy: Secondary | ICD-10-CM | POA: Insufficient documentation

## 2014-09-09 DIAGNOSIS — E785 Hyperlipidemia, unspecified: Secondary | ICD-10-CM | POA: Insufficient documentation

## 2014-09-09 DIAGNOSIS — Y69 Unspecified misadventure during surgical and medical care: Secondary | ICD-10-CM | POA: Insufficient documentation

## 2014-09-09 DIAGNOSIS — N189 Chronic kidney disease, unspecified: Secondary | ICD-10-CM | POA: Insufficient documentation

## 2014-09-09 DIAGNOSIS — Y929 Unspecified place or not applicable: Secondary | ICD-10-CM | POA: Diagnosis not present

## 2014-09-09 DIAGNOSIS — I4891 Unspecified atrial fibrillation: Secondary | ICD-10-CM | POA: Diagnosis not present

## 2014-09-09 DIAGNOSIS — Z87442 Personal history of urinary calculi: Secondary | ICD-10-CM | POA: Insufficient documentation

## 2014-09-09 DIAGNOSIS — I129 Hypertensive chronic kidney disease with stage 1 through stage 4 chronic kidney disease, or unspecified chronic kidney disease: Secondary | ICD-10-CM | POA: Diagnosis not present

## 2014-09-09 DIAGNOSIS — Z823 Family history of stroke: Secondary | ICD-10-CM | POA: Diagnosis not present

## 2014-09-09 DIAGNOSIS — M25511 Pain in right shoulder: Secondary | ICD-10-CM | POA: Diagnosis present

## 2014-09-09 HISTORY — PX: HARDWARE REMOVAL: SHX979

## 2014-09-09 HISTORY — DX: Presence of external hearing-aid: Z97.4

## 2014-09-09 LAB — POCT I-STAT, CHEM 8
BUN: 19 mg/dL (ref 6–23)
CALCIUM ION: 1.17 mmol/L (ref 1.13–1.30)
CHLORIDE: 97 meq/L (ref 96–112)
Creatinine, Ser: 1 mg/dL (ref 0.50–1.10)
GLUCOSE: 145 mg/dL — AB (ref 70–99)
HCT: 37 % (ref 36.0–46.0)
Hemoglobin: 12.6 g/dL (ref 12.0–15.0)
Potassium: 3.1 mmol/L — ABNORMAL LOW (ref 3.5–5.1)
SODIUM: 138 mmol/L (ref 135–145)
TCO2: 25 mmol/L (ref 0–100)

## 2014-09-09 SURGERY — REMOVAL, HARDWARE
Anesthesia: General | Site: Shoulder | Laterality: Right

## 2014-09-09 MED ORDER — BUPIVACAINE HCL (PF) 0.25 % IJ SOLN
INTRAMUSCULAR | Status: DC | PRN
  Administered 2014-09-09: 7 mL

## 2014-09-09 MED ORDER — ONDANSETRON HCL 4 MG/2ML IJ SOLN
INTRAMUSCULAR | Status: DC | PRN
  Administered 2014-09-09: 4 mg via INTRAVENOUS

## 2014-09-09 MED ORDER — PHENYLEPHRINE HCL 10 MG/ML IJ SOLN
10.0000 mg | INTRAVENOUS | Status: DC | PRN
  Administered 2014-09-09: 50 ug/min via INTRAVENOUS

## 2014-09-09 MED ORDER — BUPIVACAINE HCL (PF) 0.25 % IJ SOLN
INTRAMUSCULAR | Status: AC
  Filled 2014-09-09: qty 30

## 2014-09-09 MED ORDER — CLINDAMYCIN PHOSPHATE 900 MG/50ML IV SOLN
INTRAVENOUS | Status: AC
  Filled 2014-09-09: qty 50

## 2014-09-09 MED ORDER — CLINDAMYCIN PHOSPHATE 900 MG/50ML IV SOLN
900.0000 mg | INTRAVENOUS | Status: AC
  Administered 2014-09-09: 900 mg via INTRAVENOUS

## 2014-09-09 MED ORDER — CHLORHEXIDINE GLUCONATE 4 % EX LIQD: 60.0000 mL | Freq: Once | CUTANEOUS | Status: DC

## 2014-09-09 MED ORDER — LACTATED RINGERS IV SOLN
INTRAVENOUS | Status: DC
  Administered 2014-09-09: 13:00:00 via INTRAVENOUS

## 2014-09-09 MED ORDER — LIDOCAINE HCL (PF) 1 % IJ SOLN
INTRAMUSCULAR | Status: AC
  Filled 2014-09-09: qty 30

## 2014-09-09 MED ORDER — FENTANYL CITRATE 0.05 MG/ML IJ SOLN
INTRAMUSCULAR | Status: DC | PRN
  Administered 2014-09-09: 50 ug via INTRAVENOUS

## 2014-09-09 MED ORDER — HYDROMORPHONE HCL 1 MG/ML IJ SOLN: 0.2500 mg | INTRAMUSCULAR | Status: DC | PRN

## 2014-09-09 MED ORDER — BUPIVACAINE HCL (PF) 0.5 % IJ SOLN
INTRAMUSCULAR | Status: AC
  Filled 2014-09-09: qty 30

## 2014-09-09 MED ORDER — FENTANYL CITRATE 0.05 MG/ML IJ SOLN
INTRAMUSCULAR | Status: AC
  Filled 2014-09-09: qty 6

## 2014-09-09 MED ORDER — PROPOFOL 10 MG/ML IV BOLUS
INTRAVENOUS | Status: DC | PRN
Start: 2014-09-09 — End: 2014-09-09
  Administered 2014-09-09: 100 mg via INTRAVENOUS

## 2014-09-09 MED ORDER — PROMETHAZINE HCL 25 MG/ML IJ SOLN: 6.2500 mg | INTRAMUSCULAR | Status: DC | PRN

## 2014-09-09 MED ORDER — FENTANYL CITRATE 0.05 MG/ML IJ SOLN: 50.0000 ug | INTRAMUSCULAR | Status: DC | PRN

## 2014-09-09 MED ORDER — MIDAZOLAM HCL 2 MG/2ML IJ SOLN: 1.0000 mg | INTRAMUSCULAR | Status: DC | PRN

## 2014-09-09 SURGICAL SUPPLY — 71 items
APL SKNCLS STERI-STRIP NONHPOA (GAUZE/BANDAGES/DRESSINGS)
BANDAGE ELASTIC 4 VELCRO ST LF (GAUZE/BANDAGES/DRESSINGS) IMPLANT
BANDAGE ELASTIC 6 VELCRO ST LF (GAUZE/BANDAGES/DRESSINGS) IMPLANT
BANDAGE ESMARK 6X9 LF (GAUZE/BANDAGES/DRESSINGS) IMPLANT
BENZOIN TINCTURE PRP APPL 2/3 (GAUZE/BANDAGES/DRESSINGS) IMPLANT
BLADE SURG 15 STRL LF DISP TIS (BLADE) ×1 IMPLANT
BLADE SURG 15 STRL SS (BLADE) ×2
BNDG CMPR 9X4 STRL LF SNTH (GAUZE/BANDAGES/DRESSINGS)
BNDG CMPR 9X6 STRL LF SNTH (GAUZE/BANDAGES/DRESSINGS)
BNDG COHESIVE 4X5 TAN STRL (GAUZE/BANDAGES/DRESSINGS) IMPLANT
BNDG ESMARK 4X9 LF (GAUZE/BANDAGES/DRESSINGS) IMPLANT
BNDG ESMARK 6X9 LF (GAUZE/BANDAGES/DRESSINGS)
CANISTER SUCT 1200ML W/VALVE (MISCELLANEOUS) IMPLANT
CHLORAPREP W/TINT 26ML (MISCELLANEOUS) ×2 IMPLANT
COVER BACK TABLE 60X90IN (DRAPES) ×2 IMPLANT
DECANTER SPIKE VIAL GLASS SM (MISCELLANEOUS) IMPLANT
DRAPE C-ARM 42X72 X-RAY (DRAPES) IMPLANT
DRAPE EXTREMITY T 121X128X90 (DRAPE) IMPLANT
DRAPE OEC MINIVIEW 54X84 (DRAPES) ×1 IMPLANT
DRAPE U 20/CS (DRAPES) ×2 IMPLANT
DRAPE U-SHAPE 47X51 STRL (DRAPES) IMPLANT
DRAPE U-SHAPE 76X120 STRL (DRAPES) ×2 IMPLANT
DRSG EMULSION OIL 3X3 NADH (GAUZE/BANDAGES/DRESSINGS) ×2 IMPLANT
DRSG TEGADERM 4X4.75 (GAUZE/BANDAGES/DRESSINGS) ×1 IMPLANT
ELECT REM PT RETURN 9FT ADLT (ELECTROSURGICAL) ×2
ELECTRODE REM PT RTRN 9FT ADLT (ELECTROSURGICAL) ×1 IMPLANT
GAUZE SPONGE 4X4 12PLY STRL (GAUZE/BANDAGES/DRESSINGS) ×2 IMPLANT
GAUZE SPONGE 4X4 16PLY XRAY LF (GAUZE/BANDAGES/DRESSINGS) IMPLANT
GLOVE BIO SURGEON STRL SZ7 (GLOVE) ×2 IMPLANT
GLOVE BIOGEL PI IND STRL 7.0 (GLOVE) ×1 IMPLANT
GLOVE BIOGEL PI IND STRL 8 (GLOVE) ×2 IMPLANT
GLOVE BIOGEL PI INDICATOR 7.0 (GLOVE) ×1
GLOVE BIOGEL PI INDICATOR 8 (GLOVE) ×2
GLOVE ECLIPSE 7.5 STRL STRAW (GLOVE) ×6 IMPLANT
GLOVE SURG SS PI 7.0 STRL IVOR (GLOVE) ×1 IMPLANT
GOWN STRL REUS W/ TWL LRG LVL3 (GOWN DISPOSABLE) ×1 IMPLANT
GOWN STRL REUS W/ TWL XL LVL3 (GOWN DISPOSABLE) ×1 IMPLANT
GOWN STRL REUS W/TWL LRG LVL3 (GOWN DISPOSABLE) ×4
GOWN STRL REUS W/TWL XL LVL3 (GOWN DISPOSABLE) ×2
NEEDLE HYPO 22GX1.5 SAFETY (NEEDLE) ×1 IMPLANT
NS IRRIG 1000ML POUR BTL (IV SOLUTION) ×2 IMPLANT
PACK ARTHROSCOPY DSU (CUSTOM PROCEDURE TRAY) ×1 IMPLANT
PACK BASIN DAY SURGERY FS (CUSTOM PROCEDURE TRAY) ×2 IMPLANT
PAD CAST 4YDX4 CTTN HI CHSV (CAST SUPPLIES) IMPLANT
PADDING CAST ABS 4INX4YD NS (CAST SUPPLIES) ×1
PADDING CAST ABS COTTON 4X4 ST (CAST SUPPLIES) ×1 IMPLANT
PADDING CAST COTTON 4X4 STRL (CAST SUPPLIES)
PADDING CAST COTTON 6X4 STRL (CAST SUPPLIES) IMPLANT
PENCIL BUTTON HOLSTER BLD 10FT (ELECTRODE) ×2 IMPLANT
SPLINT FAST PLASTER 5X30 (CAST SUPPLIES)
SPLINT PLASTER CAST FAST 5X30 (CAST SUPPLIES) IMPLANT
SPONGE LAP 4X18 X RAY DECT (DISPOSABLE) IMPLANT
STAPLER VISISTAT (STAPLE) IMPLANT
STOCKINETTE 6  STRL (DRAPES) ×1
STOCKINETTE 6 STRL (DRAPES) ×1 IMPLANT
STOCKINETTE IMPERVIOUS LG (DRAPES) IMPLANT
STRIP CLOSURE SKIN 1/2X4 (GAUZE/BANDAGES/DRESSINGS) IMPLANT
SUCTION FRAZIER TIP 10 FR DISP (SUCTIONS) ×1 IMPLANT
SUT ETHILON 4 0 PS 2 18 (SUTURE) ×1 IMPLANT
SUT MON AB 4-0 PC3 18 (SUTURE) IMPLANT
SUT TIGER TAPE 7 IN WHITE (SUTURE) IMPLANT
SUT VIC AB 2-0 SH 27 (SUTURE) ×2
SUT VIC AB 2-0 SH 27XBRD (SUTURE) ×1 IMPLANT
SUT VIC AB 3-0 FS2 27 (SUTURE) IMPLANT
SUT VICRYL 4-0 PS2 18IN ABS (SUTURE) IMPLANT
SYR BULB 3OZ (MISCELLANEOUS) ×2 IMPLANT
SYR CONTROL 10ML LL (SYRINGE) IMPLANT
TAPE FIBER 2MM 7IN #2 BLUE (SUTURE) IMPLANT
TOWEL OR NON WOVEN STRL DISP B (DISPOSABLE) ×2 IMPLANT
TUBE CONNECTING 20X1/4 (TUBING) IMPLANT
UNDERPAD 30X30 INCONTINENT (UNDERPADS AND DIAPERS) ×1 IMPLANT

## 2014-09-09 NOTE — Anesthesia Postprocedure Evaluation (Signed)
  Anesthesia Post-op Note  Patient: Carrie Best  Procedure(s) Performed: Procedure(s) with comments: HARDWARE REMOVAL RIGHT SHOULDER (Right) - Right shoulder hardware removal  Patient Location: PACU  Anesthesia Type:General  Level of Consciousness: awake  Airway and Oxygen Therapy: Patient Spontanous Breathing  Post-op Pain: none  Post-op Assessment: Post-op Vital signs reviewed and Patient's Cardiovascular Status Stable  Post-op Vital Signs: Reviewed and stable  Last Vitals:  Filed Vitals:   09/09/14 1300  BP: 154/66  Pulse: 78  Temp: 36.8 C  Resp: 22    Complications: No apparent anesthesia complications

## 2014-09-09 NOTE — Transfer of Care (Signed)
Immediate Anesthesia Transfer of Care Note  Patient: Carrie Best  Procedure(s) Performed: Procedure(s) with comments: HARDWARE REMOVAL RIGHT SHOULDER (Right) - Right shoulder hardware removal  Patient Location: PACU  Anesthesia Type:General  Level of Consciousness: sedated  Airway & Oxygen Therapy: Patient Spontanous Breathing and Patient connected to face mask oxygen  Post-op Assessment: Report given to PACU RN and Post -op Vital signs reviewed and stable  Post vital signs: Reviewed and stable  Complications: No apparent anesthesia complications

## 2014-09-09 NOTE — Discharge Instructions (Signed)
Discharge Instructions after Shoulder Surgery   Use ice on the shoulder intermittently over the first 48 hours after surgery. .   You may take Extra Strength Tylenol or Tylenol only in place of the pain pills. You may shower 5 days after surgery. The incision CANNOT get wet prior to 5 days. Simply allow the water to wash over the site and then pat dry. Do not rub the incision. Make sure your axilla (armpit) is completely dry after showering.  Take one aspirin a day for 2 weeks after surgery, unless you have an aspirin sensitivity/allergy or asthma.  Three to 5 times each day you should perform assisted overhead reaching and external rotation (outward turning) exercises with the operative arm. Both exercises should be done with the non-operative arm used as the "therapist arm" while the operative arm remains relaxed. Ten of each exercise should be done three to five times each day.    Overhead reach is helping to lift your stiff arm up as high as it will go. To stretch your overhead reach, lie flat on your back, relax, and grasp the wrist of the tight shoulder with your opposite hand. Using the power in your opposite arm, bring the stiff arm up as far as it is comfortable. Start holding it for ten seconds and then work up to where you can hold it for a count of 30. Breathe slowly and deeply while the arm is moved. Repeat this stretch ten times, trying to help the arm up a little higher each time.       External rotation is turning the arm out to the side while your elbow stays close to your body. External rotation is best stretched while you are lying on your back. Hold a cane, yardstick, broom handle, or dowel in both hands. Bend both elbows to a right angle. Use steady, gentle force from your normal arm to rotate the hand of the stiff shoulder out away from your body. Continue the rotation as far as it will go comfortably, holding it there for a count of 10. Repeat this exercise ten times.      Please call [phone] during normal business hours or [phone] after hours for any problems. Including the following:  - excessive redness of the incisions - drainage for more than 4 days - fever of more than 101.5 F  *Please note that pain medications will not be refilled after hours or on weekends.    Post Anesthesia Home Care Instructions  Activity: Get plenty of rest for the remainder of the day. A responsible adult should stay with you for 24 hours following the procedure.  For the next 24 hours, DO NOT: -Drive a car -Advertising copywriter -Drink alcoholic beverages -Take any medication unless instructed by your physician -Make any legal decisions or sign important papers.  Meals: Start with liquid foods such as gelatin or soup. Progress to regular foods as tolerated. Avoid greasy, spicy, heavy foods. If nausea and/or vomiting occur, drink only clear liquids until the nausea and/or vomiting subsides. Call your physician if vomiting continues.  Special Instructions/Symptoms: Your throat may feel dry or sore from the anesthesia or the breathing tube placed in your throat during surgery. If this causes discomfort, gargle with warm salt water. The discomfort should disappear within 24 hours.

## 2014-09-09 NOTE — Op Note (Signed)
Procedure(s): HARDWARE REMOVAL RIGHT SHOULDER Procedure Note  Carrie Best female 81 y.o. 09/09/2014  Procedure(s) and Anesthesia Type:    * HARDWARE REMOVAL RIGHT SHOULDER - Choice      Surgeon: Mable Paris   Assistants: Damita Lack PA-C  Anesthesia: General endotracheal anesthesia    Procedure Detail  HARDWARE REMOVAL RIGHT SHOULDER  Estimated Blood Loss:  Minimal         Drains: none  Blood Given: none         Specimens: none        Complications:  * No complications entered in OR log *         Disposition: PACU - hemodynamically stable.         Condition: stable    Procedure:   INDICATIONS FOR SURGERY: The patient had a proximal humerus nail for a displaced proximal humerus fracture which went on to heal, but the proximal screw backed out and was prominent. She was indicated for surgery to remove the prominent screw.   DESCRIPTION OF PROCEDURE: The patient was identified in preoperative  holding area where I personally marked the operative site after  verifying site, side, and procedure with the patient. The patient was taken back  to the operating room where general anesthesia was induced without  Complication. The patient did receive preoperative IV antibiotics.  The right upper extremity was prepped and draped in standard sterile fashion. The proximal screw was palpable beneath the skin. Skin is intact. A approximately 1 cm incision was made and dissection was carried down to the screw head which was exposed and removed without difficulty. There is no purulence or sign of infection. Fluoroscopy was used to verify removal of the screw. The other remaining screw was intact with no sign of loosening or backing out. The fracture appeared completely healed. Wound was irrigated with saline and subsequent to closed with 4-0 nylon. A light sterile dressing was applied.  The patient was then allowed to awaken from general anesthesia transferred  to a stretcher and taken to the recovery room in stable condition.   POSTOPERATIVE PLAN: The patient will be discharged home today with family.  Followup will be in approximately 2 weeks.

## 2014-09-09 NOTE — Anesthesia Procedure Notes (Signed)
Procedure Name: LMA Insertion Date/Time: 09/09/2014 2:03 PM Performed by: Gar Gibbon Pre-anesthesia Checklist: Patient identified, Emergency Drugs available, Suction available and Patient being monitored Patient Re-evaluated:Patient Re-evaluated prior to inductionOxygen Delivery Method: Circle System Utilized Preoxygenation: Pre-oxygenation with 100% oxygen Intubation Type: IV induction Ventilation: Mask ventilation without difficulty LMA: LMA inserted LMA Size: 4.0 Number of attempts: 1 Airway Equipment and Method: bite block Placement Confirmation: positive ETCO2 Tube secured with: Tape Dental Injury: Teeth and Oropharynx as per pre-operative assessment

## 2014-09-09 NOTE — H&P (Signed)
Carrie Best is an 81 y.o. female.   Chief Complaint: R shoulder pain HPI: s/p IMN R shoulder with prominent proximal screw.  Past Medical History  Diagnosis Date  . Hypertension   . Arthritis   . Diastolic dysfunction   . Hyperlipidemia   . Congestive heart failure     Congestive heart failure symptoms  . Heart murmur   . Dysrhythmia 05-10-12    hx. A. Fib., rate is controlled-tx. Xarelto  . Pneumonia 05-10-12    dx. in July- tx. antibiotics  . Hearing loss 05-10-12    bilateral hearing aids.  Marland Kitchen Dyspnea 05-10-12    recent 2-3 weeks hospital visit 7'13 pneumonia.  Marland Kitchen GERD (gastroesophageal reflux disease) 05-10-12    tx. with meds as needed  . Chronic kidney disease 05-10-12    hx. recent kidney stones passed.  . Depression   . Stroke 05-10-12    Lt.CVA note on scans-hx. freq. falls/ none past 6 months 01/21/14  . Wears hearing aid     Past Surgical History  Procedure Laterality Date  . Appendectomy    . Other surgical history      Bladder Surgery  . Interstim implant placement  05-10-12    now malfunctioning  . Cataract extraction, bilateral  05-10-12    bilateral  . Interstim implant removal  05/16/2012    Procedure: REMOVAL OF INTERSTIM IMPLANT;  Surgeon: Martina Sinner, MD;  Location: [facility];  Service: Urology;  Laterality: N/A;  . Cystoscopy with retrograde pyelogram, ureteroscopy and stent placement Right 01/31/2014    Procedure: CYSTOSCOPY WITH bilateral  RETROGRADE PYELOGRAM, right URETEROSCOPY AND right  STENT PLACEMENT, bladder biopsy with fulgeration;  Surgeon: Sebastian Ache, MD;  Location: [facility];  Service: Urology;  Laterality: Right;  . Holmium laser application Right 01/31/2014    Procedure: HOLMIUM LASER APPLICATION;  Surgeon: Sebastian Ache, MD;  Location: [facility];  Service: Urology;  Laterality: Right;  . Humerus im nail Right 06/10/2014    Procedure: INTRAMEDULLARY (IM) NAIL HUMERAL;  Surgeon: Mable Paris, MD;  Location: [facility];  Service: Orthopedics;   Laterality: Right;  Right intramedullary humeral nail    Family History  Problem Relation Age of Onset  . Stroke Mother   . Stroke Brother    Social History:  reports that she has never smoked. She has never used smokeless tobacco. She reports that she does not drink alcohol or use illicit drugs.  Allergies:  Allergies  Allergen Reactions  . Codeine Nausea And Vomiting  . Penicillins Hives  . Sulfur Nausea And Vomiting    Medications Prior to Admission  Medication Sig Dispense Refill  . diltiazem (DILACOR XR) 180 MG 24 hr capsule Take 180 mg by mouth every morning.    . DULoxetine (CYMBALTA) 60 MG capsule Take 60 mg by mouth every morning.     . fish oil-omega-3 fatty acids 1000 MG capsule Take 1 g by mouth daily.    . furosemide (LASIX) 40 MG tablet Take 40 mg by mouth daily.    Marland Kitchen HYDROcodone-acetaminophen (NORCO) 5-325 MG per tablet Take 2 tablets by mouth every 4 (four) hours as needed. 30 tablet 0  . LORazepam (ATIVAN) 0.5 MG tablet Take 0.5 mg by mouth every 8 (eight) hours as needed for anxiety.    . metoprolol tartrate (LOPRESSOR) 25 MG tablet Take 50 mg by mouth 2 (two) times daily.     . Rivaroxaban (XARELTO) 20 MG TABS Take 20 mg by mouth every morning.     Marland Kitchen  senna-docusate (SENOKOT-S) 8.6-50 MG per tablet Take 1 tablet by mouth 2 (two) times daily. While taking pain meds to prevent constipation 30 tablet 0  . zolpidem (AMBIEN) 10 MG tablet Take 10 mg by mouth at bedtime as needed. For sleep    . atorvastatin (LIPITOR) 20 MG tablet Take 1 tablet (20 mg total) by mouth at bedtime. 30 tablet 11  . ondansetron (ZOFRAN-ODT) 8 MG disintegrating tablet Take 1 tablet (8 mg total) by mouth every 12 (twelve) hours as needed for nausea. 20 tablet 0  . potassium chloride SA (K-DUR,KLOR-CON) 20 MEQ tablet Take 1 tablet (20 mEq total) by mouth daily. 30 tablet 11  . ranitidine (ZANTAC) 150 MG capsule Take 150 mg by mouth daily as needed. Reflux    . traMADol (ULTRAM) 50 MG tablet Take  1 tablet (50 mg total) by mouth every 6 (six) hours as needed for moderate pain. Post-operatively 30 tablet 0    Results for orders placed or performed during the hospital encounter of 09/09/14 (from the past 48 hour(s))  I-STAT, chem 8     Status: Abnormal   Collection Time: 09/09/14  1:21 PM  Result Value Ref Range   Sodium 138 135 - 145 mmol/L   Potassium 3.1 (L) 3.5 - 5.1 mmol/L   Chloride 97 96 - 112 mEq/L   BUN 19 6 - 23 mg/dL   Creatinine, Ser 4.09 0.50 - 1.10 mg/dL   Glucose, Bld 811 (H) 70 - 99 mg/dL   Calcium, Ion 9.14 7.82 - 1.30 mmol/L   TCO2 25 0 - 100 mmol/L   Hemoglobin 12.6 12.0 - 15.0 g/dL   HCT 95.6 21.3 - 08.6 %   No results found.  Review of Systems  All other systems reviewed and are negative.   Blood pressure 154/66, pulse 78, temperature 98.2 F (36.8 C), temperature source Oral, resp. rate 22, height  (1.727 m), weight 86.637 kg (191 lb), SpO2 98 %. Physical Exam  Constitutional: She is oriented to person, place, and time. She appears well-developed and well-nourished.  HENT:  Head: Atraumatic.  Eyes: EOM are normal.  Cardiovascular: Intact distal pulses.   Respiratory: Effort normal.  Musculoskeletal:  R shoulder prominent screw, skin intact  Neurological: She is alert and oriented to person, place, and time.  Skin: Skin is warm and dry.  Psychiatric: She has a normal mood and affect.     Assessment/Plan Prominent proximal screw R shoulder s/p IMN Plan Removal proximal screw Risks / benefits of surgery discussed Consent on chart  NPO for OR Preop antibiotics   [place] Marchiano WILLIAM 09/09/2014, 1:40 PM

## 2014-09-09 NOTE — Anesthesia Preprocedure Evaluation (Addendum)
Anesthesia Evaluation  Patient identified by MRN, date of birth, ID band Patient awake    Reviewed: Allergy & Precautions, NPO status , Patient's Chart, lab work & pertinent test results  Airway Mallampati: II  TM Distance: >3 FB     Dental  (+) Teeth Intact, Dental Advidsory Given   Pulmonary neg pulmonary ROS,  breath sounds clear to auscultation        Cardiovascular hypertension, On Medications Atrial Fibrillation Rhythm:regular Rate:Normal     Neuro/Psych PSYCHIATRIC DISORDERS CVA negative neurological ROS  negative psych ROS   GI/Hepatic Neg liver ROS, GERD-  ,  Endo/Other  negative endocrine ROS  Renal/GU negative Renal ROS     Musculoskeletal   Abdominal   Peds  Hematology   Anesthesia Other Findings   Reproductive/Obstetrics negative OB ROS                            Anesthesia Physical Anesthesia Plan  ASA: III  Anesthesia Plan: General LMA   Post-op Pain Management:    Induction:   Airway Management Planned:   Additional Equipment:   Intra-op Plan:   Post-operative Plan:   Informed Consent: I have reviewed the patients History and Physical, chart, labs and discussed the procedure including the risks, benefits and alternatives for the proposed anesthesia with the patient or authorized representative who has indicated his/her understanding and acceptance.     Plan Discussed with: Anesthesiologist, CRNA and Surgeon  Anesthesia Plan Comments:         Anesthesia Quick Evaluation

## 2014-09-10 ENCOUNTER — Encounter (HOSPITAL_BASED_OUTPATIENT_CLINIC_OR_DEPARTMENT_OTHER): Payer: Self-pay | Admitting: Orthopedic Surgery

## 2015-01-20 ENCOUNTER — Other Ambulatory Visit: Payer: Self-pay | Admitting: Orthopedic Surgery

## 2015-01-27 ENCOUNTER — Emergency Department (HOSPITAL_COMMUNITY): Payer: Medicare Other

## 2015-01-27 ENCOUNTER — Encounter (HOSPITAL_COMMUNITY): Payer: Self-pay | Admitting: Emergency Medicine

## 2015-01-27 ENCOUNTER — Observation Stay (HOSPITAL_COMMUNITY)
Admission: EM | Admit: 2015-01-27 | Discharge: 2015-01-28 | Disposition: A | Discharge status: Home Health | Payer: Medicare Other | Attending: Internal Medicine | Admitting: Internal Medicine

## 2015-01-27 DIAGNOSIS — R05 Cough: Secondary | ICD-10-CM

## 2015-01-27 DIAGNOSIS — R5383 Other fatigue: Secondary | ICD-10-CM | POA: Diagnosis not present

## 2015-01-27 DIAGNOSIS — R4182 Altered mental status, unspecified: Secondary | ICD-10-CM

## 2015-01-27 DIAGNOSIS — I5032 Chronic diastolic (congestive) heart failure: Secondary | ICD-10-CM | POA: Insufficient documentation

## 2015-01-27 DIAGNOSIS — G8929 Other chronic pain: Secondary | ICD-10-CM | POA: Insufficient documentation

## 2015-01-27 DIAGNOSIS — H9190 Unspecified hearing loss, unspecified ear: Secondary | ICD-10-CM | POA: Insufficient documentation

## 2015-01-27 DIAGNOSIS — R059 Cough, unspecified: Secondary | ICD-10-CM

## 2015-01-27 DIAGNOSIS — I129 Hypertensive chronic kidney disease with stage 1 through stage 4 chronic kidney disease, or unspecified chronic kidney disease: Secondary | ICD-10-CM | POA: Insufficient documentation

## 2015-01-27 DIAGNOSIS — R739 Hyperglycemia, unspecified: Secondary | ICD-10-CM | POA: Diagnosis not present

## 2015-01-27 DIAGNOSIS — M25511 Pain in right shoulder: Secondary | ICD-10-CM | POA: Insufficient documentation

## 2015-01-27 DIAGNOSIS — Z8673 Personal history of transient ischemic attack (TIA), and cerebral infarction without residual deficits: Secondary | ICD-10-CM | POA: Insufficient documentation

## 2015-01-27 DIAGNOSIS — R197 Diarrhea, unspecified: Secondary | ICD-10-CM | POA: Diagnosis not present

## 2015-01-27 DIAGNOSIS — N182 Chronic kidney disease, stage 2 (mild): Secondary | ICD-10-CM | POA: Insufficient documentation

## 2015-01-27 DIAGNOSIS — E785 Hyperlipidemia, unspecified: Secondary | ICD-10-CM | POA: Insufficient documentation

## 2015-01-27 DIAGNOSIS — M25562 Pain in left knee: Secondary | ICD-10-CM | POA: Insufficient documentation

## 2015-01-27 DIAGNOSIS — Z880 Allergy status to penicillin: Secondary | ICD-10-CM | POA: Insufficient documentation

## 2015-01-27 DIAGNOSIS — M25561 Pain in right knee: Secondary | ICD-10-CM | POA: Insufficient documentation

## 2015-01-27 LAB — URINALYSIS, ROUTINE W REFLEX MICROSCOPIC
BILIRUBIN URINE: NEGATIVE
Glucose, UA: NEGATIVE mg/dL
Hgb urine dipstick: NEGATIVE
Ketones, ur: NEGATIVE mg/dL
Leukocytes, UA: NEGATIVE
Nitrite: NEGATIVE
Protein, ur: NEGATIVE mg/dL
Specific Gravity, Urine: 1.008 (ref 1.005–1.030)
UROBILINOGEN UA: 0.2 mg/dL (ref 0.0–1.0)
pH: 6.5 (ref 5.0–8.0)

## 2015-01-27 LAB — CBC
HEMATOCRIT: 37 % (ref 36.0–46.0)
Hemoglobin: 12.5 g/dL (ref 12.0–15.0)
MCH: 29.3 pg (ref 26.0–34.0)
MCHC: 33.8 g/dL (ref 30.0–36.0)
MCV: 86.7 fL (ref 78.0–100.0)
Platelets: UNDETERMINED 10*3/uL (ref 150–400)
RBC: 4.27 MIL/uL (ref 3.87–5.11)
RDW: 15.5 % (ref 11.5–15.5)
WBC: 6.3 10*3/uL (ref 4.0–10.5)

## 2015-01-27 LAB — BASIC METABOLIC PANEL
Anion gap: 10 (ref 5–15)
BUN: 12 mg/dL (ref 6–20)
CO2: 26 mmol/L (ref 22–32)
CREATININE: 0.95 mg/dL (ref 0.44–1.00)
Calcium: 11 mg/dL — ABNORMAL HIGH (ref 8.9–10.3)
Chloride: 105 mmol/L (ref 101–111)
GFR, EST NON AFRICAN AMERICAN: 53 mL/min — AB (ref >60)
Glucose, Bld: 114 mg/dL — ABNORMAL HIGH (ref 65–99)
POTASSIUM: 3.9 mmol/L (ref 3.5–5.1)
SODIUM: 141 mmol/L (ref 135–145)

## 2015-01-27 LAB — I-STAT CG4 LACTIC ACID, ED: Lactic Acid, Venous: 1.33 mmol/L (ref 0.5–2.0)

## 2015-01-27 LAB — I-STAT TROPONIN, ED: Troponin i, poc: 0.01 ng/mL (ref 0.00–0.08)

## 2015-01-27 MED ORDER — SODIUM CHLORIDE 0.9 % IV BOLUS (SEPSIS)
1000.0000 mL | Freq: Once | INTRAVENOUS | Status: AC
  Administered 2015-01-27: 1000 mL via INTRAVENOUS

## 2015-01-27 NOTE — ED Notes (Signed)
Per ems family states patient has not gotten out of bed and has been lethargic all day. Patient states no pain.

## 2015-01-27 NOTE — ED Notes (Signed)
Patient transported to CT 

## 2015-01-28 ENCOUNTER — Encounter (HOSPITAL_COMMUNITY): Payer: Self-pay | Admitting: Internal Medicine

## 2015-01-28 ENCOUNTER — Observation Stay (HOSPITAL_COMMUNITY): Payer: Medicare Other

## 2015-01-28 DIAGNOSIS — R05 Cough: Secondary | ICD-10-CM

## 2015-01-28 DIAGNOSIS — R400 Somnolence: Secondary | ICD-10-CM | POA: Diagnosis not present

## 2015-01-28 DIAGNOSIS — R739 Hyperglycemia, unspecified: Secondary | ICD-10-CM

## 2015-01-28 DIAGNOSIS — R059 Cough, unspecified: Secondary | ICD-10-CM

## 2015-01-28 DIAGNOSIS — R4182 Altered mental status, unspecified: Secondary | ICD-10-CM | POA: Diagnosis not present

## 2015-01-28 LAB — RAPID URINE DRUG SCREEN, HOSP PERFORMED
AMPHETAMINES: NOT DETECTED
BENZODIAZEPINES: NOT DETECTED
Barbiturates: NOT DETECTED
COCAINE: NOT DETECTED
Opiates: NOT DETECTED
Tetrahydrocannabinol: NOT DETECTED

## 2015-01-28 LAB — COMPREHENSIVE METABOLIC PANEL
ALK PHOS: 101 U/L (ref 38–126)
ALT: 11 U/L — ABNORMAL LOW (ref 14–54)
ANION GAP: 8 (ref 5–15)
AST: 19 U/L (ref 15–41)
Albumin: 3.3 g/dL — ABNORMAL LOW (ref 3.5–5.0)
BUN: 11 mg/dL (ref 6–20)
CO2: 25 mmol/L (ref 22–32)
CREATININE: 0.86 mg/dL (ref 0.44–1.00)
Calcium: 10.5 mg/dL — ABNORMAL HIGH (ref 8.9–10.3)
Chloride: 107 mmol/L (ref 101–111)
GFR calc Af Amer: >60 mL/min (ref >60)
GFR, EST NON AFRICAN AMERICAN: 60 mL/min — AB (ref >60)
GLUCOSE: 128 mg/dL — AB (ref 65–99)
Potassium: 3.5 mmol/L (ref 3.5–5.1)
Sodium: 140 mmol/L (ref 135–145)
Total Bilirubin: 0.7 mg/dL (ref 0.3–1.2)
Total Protein: 6.4 g/dL — ABNORMAL LOW (ref 6.5–8.1)

## 2015-01-28 LAB — VITAMIN B12: Vitamin B-12: 355 pg/mL (ref 180–914)

## 2015-01-28 LAB — PHOSPHORUS: Phosphorus: 3.9 mg/dL (ref 2.5–4.6)

## 2015-01-28 LAB — MAGNESIUM: Magnesium: 2 mg/dL (ref 1.7–2.4)

## 2015-01-28 LAB — RPR: RPR: NONREACTIVE

## 2015-01-28 LAB — TSH: TSH: 1.135 u[IU]/mL (ref 0.350–4.500)

## 2015-01-28 LAB — CBG MONITORING, ED: Glucose-Capillary: 108 mg/dL — ABNORMAL HIGH (ref 65–99)

## 2015-01-28 MED ORDER — LORAZEPAM 0.5 MG PO TABS: 0.5000 mg | ORAL_TABLET | Freq: Three times a day (TID) | ORAL | Status: DC | PRN

## 2015-01-28 MED ORDER — RIVAROXABAN 20 MG PO TABS: 20.0000 mg | ORAL_TABLET | Freq: Every morning | ORAL | Status: DC

## 2015-01-28 MED ORDER — ATORVASTATIN CALCIUM 10 MG PO TABS: 20.0000 mg | ORAL_TABLET | Freq: Every day | ORAL | Status: DC

## 2015-01-28 MED ORDER — POTASSIUM CHLORIDE CRYS ER 20 MEQ PO TBCR: 20.0000 meq | EXTENDED_RELEASE_TABLET | Freq: Every day | ORAL | Status: DC

## 2015-01-28 MED ORDER — METOPROLOL TARTRATE 50 MG PO TABS
50.0000 mg | ORAL_TABLET | Freq: Two times a day (BID) | ORAL | Status: DC
  Administered 2015-01-28 (×2): 50 mg via ORAL
  Filled 2015-01-28: qty 1

## 2015-01-28 MED ORDER — RIVAROXABAN 20 MG PO TABS: 20.0000 mg | ORAL_TABLET | Freq: Every evening | ORAL | Status: DC

## 2015-01-28 MED ORDER — TRAMADOL HCL 50 MG PO TABS: 50.0000 mg | ORAL_TABLET | Freq: Four times a day (QID) | ORAL | Status: DC | PRN

## 2015-01-28 MED ORDER — FUROSEMIDE 40 MG PO TABS
40.0000 mg | ORAL_TABLET | Freq: Every day | ORAL | Status: DC
  Administered 2015-01-28: 40 mg via ORAL
  Filled 2015-01-28: qty 1

## 2015-01-28 MED ORDER — DILTIAZEM HCL ER 180 MG PO CP24
180.0000 mg | ORAL_CAPSULE | Freq: Every morning | ORAL | Status: DC
  Administered 2015-01-28: 180 mg via ORAL
  Filled 2015-01-28 (×2): qty 1

## 2015-01-28 MED ORDER — POTASSIUM CHLORIDE CRYS ER 20 MEQ PO TBCR
20.0000 meq | EXTENDED_RELEASE_TABLET | Freq: Every day | ORAL | Status: DC
  Administered 2015-01-28: 20 meq via ORAL
  Filled 2015-01-28: qty 1

## 2015-01-28 MED ORDER — OMEGA-3-ACID ETHYL ESTERS 1 G PO CAPS
1.0000 g | ORAL_CAPSULE | Freq: Every day | ORAL | Status: DC
  Administered 2015-01-28: 1 g via ORAL
  Filled 2015-01-28 (×2): qty 1

## 2015-01-28 MED ORDER — SODIUM CHLORIDE 0.9 % IV SOLN
INTRAVENOUS | Status: AC
  Administered 2015-01-28: 75 mL/h via INTRAVENOUS

## 2015-01-28 MED ORDER — HYDROCODONE-ACETAMINOPHEN 5-325 MG PO TABS: 1.0000 | ORAL_TABLET | ORAL | Status: DC | PRN

## 2015-01-28 MED ORDER — SENNOSIDES-DOCUSATE SODIUM 8.6-50 MG PO TABS
1.0000 | ORAL_TABLET | Freq: Two times a day (BID) | ORAL | Status: DC
  Administered 2015-01-28: 1 via ORAL
  Filled 2015-01-28: qty 1

## 2015-01-28 MED ORDER — SODIUM CHLORIDE 0.9 % IJ SOLN
3.0000 mL | Freq: Two times a day (BID) | INTRAMUSCULAR | Status: DC
  Administered 2015-01-28 (×2): 3 mL via INTRAVENOUS

## 2015-01-28 MED ORDER — DULOXETINE HCL 60 MG PO CPEP
60.0000 mg | ORAL_CAPSULE | Freq: Every morning | ORAL | Status: DC
  Administered 2015-01-28: 60 mg via ORAL
  Filled 2015-01-28: qty 1

## 2015-01-28 NOTE — Discharge Instructions (Signed)

## 2015-01-28 NOTE — ED Provider Notes (Signed)
CSN: [id number]     Arrival date & time 01/27/15  2132 History   First MD Initiated Contact with Patient 01/27/15 2132     Chief Complaint  Patient presents with  . Fatigue     (Consider location/radiation/quality/duration/timing/severity/associated sxs/prior Treatment) Patient is a 81 y.o. female presenting with altered mental status. The history is provided by the patient.  Altered Mental Status Presenting symptoms: lethargy   Severity:  Mild Most recent episode:  Today Episode history:  Single Timing:  Constant Progression:  Waxing and waning Chronicity:  New Context: not dementia, not a nursing home resident and not a recent change in medication   Associated symptoms: no fever and no vomiting     Past Medical History  Diagnosis Date  . Hypertension   . Arthritis   . Diastolic dysfunction   . Hyperlipidemia   . Congestive heart failure     Congestive heart failure symptoms  . Heart murmur   . Dysrhythmia 05-10-12    hx. A. Fib., rate is controlled-tx. Xarelto  . Pneumonia 05-10-12    dx. in July- tx. antibiotics  . Hearing loss 05-10-12    bilateral hearing aids.  Marland Kitchen Dyspnea 05-10-12    recent 2-3 weeks hospital visit 7'13 pneumonia.  Marland Kitchen GERD (gastroesophageal reflux disease) 05-10-12    tx. with meds as needed  . Chronic kidney disease 05-10-12    hx. recent kidney stones passed.  . Depression   . Stroke 05-10-12    Lt.CVA note on scans-hx. freq. falls/ none past 6 months 01/21/14  . Wears hearing aid    Past Surgical History  Procedure Laterality Date  . Appendectomy    . Other surgical history      Bladder Surgery  . Interstim implant placement  05-10-12    now malfunctioning  . Cataract extraction, bilateral  05-10-12    bilateral  . Interstim implant removal  05/16/2012    Procedure: REMOVAL OF INTERSTIM IMPLANT;  Surgeon: Martina Sinner, MD;  Location: [facility];  Service: Urology;  Laterality: N/A;  . Cystoscopy with retrograde pyelogram, ureteroscopy and stent  placement Right 01/31/2014    Procedure: CYSTOSCOPY WITH bilateral  RETROGRADE PYELOGRAM, right URETEROSCOPY AND right  STENT PLACEMENT, bladder biopsy with fulgeration;  Surgeon: Sebastian Ache, MD;  Location: [facility];  Service: Urology;  Laterality: Right;  . Holmium laser application Right 01/31/2014    Procedure: HOLMIUM LASER APPLICATION;  Surgeon: Sebastian Ache, MD;  Location: [facility];  Service: Urology;  Laterality: Right;  . Humerus im nail Right 06/10/2014    Procedure: INTRAMEDULLARY (IM) NAIL HUMERAL;  Surgeon: Mable Paris, MD;  Location: [facility];  Service: Orthopedics;  Laterality: Right;  Right intramedullary humeral nail  . Hardware removal Right 09/09/2014    Procedure: HARDWARE REMOVAL RIGHT SHOULDER;  Surgeon: Mable Paris, MD;  Location: [facility];  Service: Orthopedics;  Laterality: Right;  Right shoulder hardware removal   Family History  Problem Relation Age of Onset  . Stroke Mother   . Stroke Brother    History  Substance Use Topics  . Smoking status: Never Smoker   . Smokeless tobacco: Never Used  . Alcohol Use: No   OB History    No data available     Review of Systems  Constitutional: Negative for fever.  Respiratory: Negative for cough and shortness of breath.   Gastrointestinal: Negative for vomiting.  All other systems reviewed and are negative.     Allergies  Codeine;  Penicillins; and Sulfur  Home Medications   Prior to Admission medications   Medication Sig Start Date End Date Taking? Authorizing Provider  atorvastatin (LIPITOR) 20 MG tablet Take 1 tablet (20 mg total) by mouth at bedtime. 03/23/12 05/31/14  Kirby FunkJohn Griffin, MD  diltiazem (DILACOR XR) 180 MG 24 hr capsule Take 180 mg by mouth every morning.    Historical Provider, MD  DULoxetine (CYMBALTA) 60 MG capsule Take 60 mg by mouth every morning.     Historical Provider, MD  fish oil-omega-3 fatty acids 1000 MG capsule Take 1 g by mouth daily.    Historical  Provider, MD  furosemide (LASIX) 40 MG tablet Take 40 mg by mouth daily.    Historical Provider, MD  HYDROcodone-acetaminophen (NORCO) 5-325 MG per tablet Take 2 tablets by mouth every 4 (four) hours as needed. 06/12/14   Danielle Laliberte, PA-C  LORazepam (ATIVAN) 0.5 MG tablet Take 0.5 mg by mouth every 8 (eight) hours as needed for anxiety.    Historical Provider, MD  metoprolol tartrate (LOPRESSOR) 25 MG tablet Take 50 mg by mouth 2 (two) times daily.  03/23/12   Kirby FunkJohn Griffin, MD  potassium chloride SA (K-DUR,KLOR-CON) 20 MEQ tablet Take 1 tablet (20 mEq total) by mouth daily. 03/23/12 05/31/14  Kirby FunkJohn Griffin, MD  ranitidine (ZANTAC) 150 MG capsule Take 150 mg by mouth daily as needed. Reflux    Historical Provider, MD  Rivaroxaban (XARELTO) 20 MG TABS Take 20 mg by mouth every morning.     Historical Provider, MD  senna-docusate (SENOKOT-S) 8.6-50 MG per tablet Take 1 tablet by mouth 2 (two) times daily. While taking pain meds to prevent constipation 01/31/14   Sebastian Acheheodore Manny, MD  traMADol (ULTRAM) 50 MG tablet Take 1 tablet (50 mg total) by mouth every 6 (six) hours as needed for moderate pain. Post-operatively 01/31/14   Sebastian Acheheodore Manny, MD  zolpidem (AMBIEN) 10 MG tablet Take 10 mg by mouth at bedtime as needed. For sleep    Historical Provider, MD   BP 185/69 mmHg  Pulse 97  Temp(Src) 98.8 F (37.1 C) (Oral)  Resp 14  Ht 5\' 8"  (1.727 m)  Wt 191 lb (86.637 kg)  BMI 29.05 kg/m2  SpO2 96% Physical Exam  Constitutional: She is oriented to person, place, and time. She appears well-developed and well-nourished. No distress.  HENT:  Head: Normocephalic and atraumatic.  Mouth/Throat: Oropharynx is clear and moist.  Eyes: EOM are normal. Pupils are equal, round, and reactive to light.  Neck: Normal range of motion. Neck supple.  Cardiovascular: Normal rate and regular rhythm.  Exam reveals no friction rub.   No murmur heard. Pulmonary/Chest: Effort normal and breath sounds normal. No  respiratory distress. She has no wheezes. She has no rales.  Abdominal: Soft. She exhibits no distension. There is no tenderness. There is no rebound.  Musculoskeletal: Normal range of motion. She exhibits no edema.  Neurological: She is alert and oriented to person, place, and time.  Skin: She is not diaphoretic.  Nursing note and vitals reviewed.   ED Course  Procedures (including critical care time) Labs Review Labs Reviewed  BASIC METABOLIC PANEL - Abnormal; Notable for the following:    Glucose, Bld 114 (*)    Calcium 11.0 (*)    GFR calc non Af Amer 53 (*)    All other components within normal limits  URINALYSIS, ROUTINE W REFLEX MICROSCOPIC - Abnormal; Notable for the following:    Color, Urine STRAW (*)    APPearance  HAZY (*)    All other components within normal limits  URINE CULTURE  CBC  I-STAT CG4 LACTIC ACID, ED  I-STAT TROPOININ, ED  CBG MONITORING, ED    Imaging Review Dg Chest 2 View  01/27/2015   CLINICAL DATA:  Altered mental status.  Shortness of breath.  EXAM: CHEST  2 VIEW  COMPARISON:  01/22/2014; 05/17/2013  FINDINGS: Grossly unchanged cardiac silhouette and mediastinal contours with atherosclerotic plaque within the thoracic aorta. Minimal bibasilar opacities favored to represent atelectasis. Mild cephalization of flow with development of trace bilateral effusions. No pneumothorax. Post intra medullary rod fixation of right humerus transfixing a comminuted fracture involving the surgical neck of the right humerus, incompletely evaluated.  IMPRESSION: Mild pulmonary edema, trace pleural effusions and associated bibasilar atelectasis.   Electronically Signed   By: Simonne Come M.D.   On: 01/27/2015 23:35   Ct Head Wo Contrast  01/27/2015   CLINICAL DATA:  Altered mental status  EXAM: CT HEAD WITHOUT CONTRAST  TECHNIQUE: Contiguous axial images were obtained from the base of the skull through the vertex without intravenous contrast.  COMPARISON:  01/31/2012   FINDINGS: Skull and Sinuses:Negative for fracture or destructive process. The mastoids, middle ears, and imaged paranasal sinuses are clear.  Orbits: Bilateral cataract resection.  No acute findings.  Brain: No evidence of acute infarction, hemorrhage, hydrocephalus, or mass lesion/mass effect. There is generalized cerebral volume loss which is typical for age and stable from 2013. Extensive chronic small vessel disease with ischemic gliosis throughout the bilateral cerebral white matter. Stable ex vacuo ventriculomegaly.  IMPRESSION: 1. No acute findings. 2. Extensive chronic small vessel disease, stable from 2013.   Electronically Signed   By: Marnee Spring M.D.   On: 01/27/2015 22:23     EKG Interpretation   Date/Time:  Monday Jan 27 2015 21:50:29 EDT Ventricular Rate:  94 PR Interval:    QRS Duration: 125 QT Interval:  403 QTC Calculation: 504 R Axis:   -39 Text Interpretation:  Atrial fibrillation Nonspecific IVCD with LAD LVH  with secondary repolarization abnormality Anterior infarct, old Similar to  prior Confirmed by [facility]  MD, Ever Gustafson ([id number]) on 01/28/2015 12:47:03 AM      MDM   Final diagnoses:  Altered mental status    81 year old female here with altered mental status. More sleepy today. No fevers. Daughter here controls her medicines and reactive overdose. No fevers. Patient appears sleepy, but will awaken and tells me the year, where she is, and her age.  AFVSS here. Sleepy. I had to awaken her several times during my exam. Workup unremarkable. Admitted for altered mental status.    Elwin Mocha, MD 01/28/15 [phone]

## 2015-01-28 NOTE — ED Notes (Signed)
CBG 108. 

## 2015-01-28 NOTE — Discharge Summary (Signed)
Physician Discharge Summary  Carrie Best EAV:[id number] DOB: Jul 15, 1928 DOA: 01/27/2015  PCP: Lillia Mountain, MD  Admit date: 01/27/2015 Discharge date: 01/28/2015  Time spent: 45 minutes  Recommendations for Outpatient Follow-up:  Home health RN to monitor Home Medications PCP dr.Griffin in 1 week, please follow-up HbA1c Please check routine urine drug screen upon follow-up  Discharge Diagnoses:  Active Problems:   Lethargy/Somnolence    Chronic bilateral knee pain   R shoulder pain   P.afib   Hyperglycemia   Diastolic dysfunction    Hearing loss   CKD 2   H/o CVA  Discharge Condition: stable  Diet recommendation: regular  Filed Weights   01/27/15 2145 01/28/15 0130  Weight: 86.637 kg (191 lb) 86.773 kg (191 lb 4.8 oz)    History of present illness:  Chief Complaint: fatigue, somnolence, ams HPI: 81 yo female with htn, Pafib (CHADS2=5), CVA, Had been somnolent  Most of the day 5/23 which is unusual per her family. No recent medication changes. Pt denies taking more pain medication thank normal. Pt was noted to have minimal hypercalcemia  Hospital Course:  Lethargy/Somnolence: -Etiology unclear, resolved, normal coherent and back to baseline per family -Initially polypharmacy suspected however with negative drug screen that seems unlikely -Unclear what pain medicines as she was getting at home since she was prescribed hydrocodone and lorazepam but her urine drug screen was negative, apparently her daughter and grandson have been giving her medications. -MRI brain negative for acute CVA,  -B 12, TSH normal -Discussed with granddaughter and daughter-in-law about need to monitor what's happening with her controlled medications. -Calcium was minimally elevated on admission which improved to normal with gentle hydration hence do not suspect this is the etiology -I have advised her to cut down her hydrocodone to 1 tablet from 2 tabs every 4  hours  Hypercalcemia -resolved with gentle IV fluids  Hyperglycemia -CBGs 108-128, no history of diabetes please follow-up HbA1c after discharge  Pafib  Cont xarelto, diltiazem and metoprolol  History of chronic diastolic CHF -Stable, resumed Lasix  Discharge Exam: Filed Vitals:   01/28/15 0956  BP: 131/64  Pulse: 73  Temp: 98.1 F (36.7 C)  Resp: 18    General: AAOx3 no distress Cardiovascular: S1S2/RRR Respiratory: CTAB  Discharge Instructions   Discharge Instructions    Diet - low sodium heart healthy    Complete by:  As directed      Increase activity slowly    Complete by:  As directed           Current Discharge Medication List    CONTINUE these medications which have CHANGED   Details  HYDROcodone-acetaminophen (NORCO) 5-325 MG per tablet Take 1 tablet by mouth every 4 (four) hours as needed. Refills: 0      CONTINUE these medications which have NOT CHANGED   Details  diltiazem (DILACOR XR) 180 MG 24 hr capsule Take 180 mg by mouth every morning.    DULoxetine (CYMBALTA) 60 MG capsule Take 60 mg by mouth every morning.     fish oil-omega-3 fatty acids 1000 MG capsule Take 1 g by mouth daily.    furosemide (LASIX) 40 MG tablet Take 40 mg by mouth daily.    LORazepam (ATIVAN) 0.5 MG tablet Take 0.5 mg by mouth every 8 (eight) hours as needed for anxiety.    metoprolol tartrate (LOPRESSOR) 25 MG tablet Take 50 mg by mouth 2 (two) times daily.     ranitidine (ZANTAC) 150 MG capsule Take 150 mg  by mouth daily as needed. Reflux    Rivaroxaban (XARELTO) 20 MG TABS Take 20 mg by mouth every morning.     senna-docusate (SENOKOT-S) 8.6-50 MG per tablet Take 1 tablet by mouth 2 (two) times daily. While taking pain meds to prevent constipation Qty: 30 tablet, Refills: 0    traMADol (ULTRAM) 50 MG tablet Take 1 tablet (50 mg total) by mouth every 6 (six) hours as needed for moderate pain. Post-operatively Qty: 30 tablet, Refills: 0    zolpidem  (AMBIEN) 10 MG tablet Take 10 mg by mouth at bedtime as needed. For sleep      STOP taking these medications     atorvastatin (LIPITOR) 20 MG tablet      potassium chloride SA (K-DUR,KLOR-CON) 20 MEQ tablet        Allergies  Allergen Reactions  . Codeine Nausea And Vomiting  . Penicillins Hives  . Sulfur Nausea And Vomiting   Follow-up Information    Follow up with Lillia Mountain, MD. Schedule an appointment as soon as possible for a visit in 1 week.   Specialty:  Internal Medicine   Contact information:   [address] [place] [address] [phone]        The results of significant diagnostics from this hospitalization (including imaging, microbiology, ancillary and laboratory) are listed below for reference.    Significant Diagnostic Studies: Dg Chest 2 View  01/27/2015   CLINICAL DATA:  Altered mental status.  Shortness of breath.  EXAM: CHEST  2 VIEW  COMPARISON:  01/22/2014; 05/17/2013  FINDINGS: Grossly unchanged cardiac silhouette and mediastinal contours with atherosclerotic plaque within the thoracic aorta. Minimal bibasilar opacities favored to represent atelectasis. Mild cephalization of flow with development of trace bilateral effusions. No pneumothorax. Post intra medullary rod fixation of right humerus transfixing a comminuted fracture involving the surgical neck of the right humerus, incompletely evaluated.  IMPRESSION: Mild pulmonary edema, trace pleural effusions and associated bibasilar atelectasis.   Electronically Signed   By: Simonne Come M.D.   On: 01/27/2015 23:35   Ct Head Wo Contrast  01/27/2015   CLINICAL DATA:  Altered mental status  EXAM: CT HEAD WITHOUT CONTRAST  TECHNIQUE: Contiguous axial images were obtained from the base of the skull through the vertex without intravenous contrast.  COMPARISON:  01/31/2012  FINDINGS: Skull and Sinuses:Negative for fracture or destructive process. The mastoids, middle ears, and imaged  paranasal sinuses are clear.  Orbits: Bilateral cataract resection.  No acute findings.  Brain: No evidence of acute infarction, hemorrhage, hydrocephalus, or mass lesion/mass effect. There is generalized cerebral volume loss which is typical for age and stable from 2013. Extensive chronic small vessel disease with ischemic gliosis throughout the bilateral cerebral white matter. Stable ex vacuo ventriculomegaly.  IMPRESSION: 1. No acute findings. 2. Extensive chronic small vessel disease, stable from 2013.   Electronically Signed   By: Marnee Spring M.D.   On: 01/27/2015 22:23   Mr Brain Wo Contrast  01/28/2015   CLINICAL DATA:  Somnolent today, hypercalcemia and altered mental status. History of hypertension, atrial fibrillation, stroke.  EXAM: MRI HEAD WITHOUT CONTRAST  TECHNIQUE: Multiplanar, multiecho pulse sequences of the brain and surrounding structures were obtained without intravenous contrast.  COMPARISON:  CT head Jan 27, 2015 and MRI head June 09, 2012  FINDINGS: No reduced diffusion to suggest acute ischemia. No susceptibility artifact to suggest hemorrhage. Moderate to severe ventriculomegaly, predominately on the basis of global parenchymal brain volume loss though, there  is slight effacement of the sulci at the convexities. Confluent supratentorial white matter T2 hyperintensities are stable to slightly worse. No midline shift, mass effect or mass lesions.  No abnormal extra-axial fluid collections. Normal major intracranial vascular flow voids seen at the skull base. Status post bilateral ocular lens implants. Paranasal sinuses and mastoid air cells are well aerated. Severe temporomandibular osteoarthrosis. No abnormal sellar expansion. No cerebellar tonsillar ectopia. No suspicious calvarial bone marrow signal.  IMPRESSION: No acute intracranial process.  Moderate to severe global parenchymal brain volume loss with a component of suspected normal pressure hydrocephalus.  Moderate to severe  white matter changes compatible chronic small vessel ischemic disease.   Electronically Signed   By: Awilda Metroourtnay  Bloomer   On: 01/28/2015 04:57    Microbiology: No results found for this or any previous visit (from the past 240 hour(s)).   Labs: Basic Metabolic Panel:  Recent Labs Lab 01/27/15 2149 01/28/15 0218  NA 141 140  K 3.9 3.5  CL 105 107  CO2 26 25  GLUCOSE 114* 128*  BUN 12 11  CREATININE 0.95 0.86  CALCIUM 11.0* 10.5*  MG  --  2.0  PHOS  --  3.9   Liver Function Tests:  Recent Labs Lab 01/28/15 0218  AST 19  ALT 11*  ALKPHOS 101  BILITOT 0.7  PROT 6.4*  ALBUMIN 3.3*   No results for input(s): LIPASE, AMYLASE in the last 168 hours. No results for input(s): AMMONIA in the last 168 hours. CBC:  Recent Labs Lab 01/27/15 2149  WBC 6.3  HGB 12.5  HCT 37.0  MCV 86.7  PLT PLATELET CLUMPS NOTED ON SMEAR, UNABLE TO ESTIMATE   Cardiac Enzymes: No results for input(s): CKTOTAL, CKMB, CKMBINDEX, TROPONINI in the last 168 hours. BNP: BNP (last 3 results) No results for input(s): BNP in the last 8760 hours.  ProBNP (last 3 results) No results for input(s): PROBNP in the last 8760 hours.  CBG:  Recent Labs Lab 01/28/15 0047  GLUCAP 108*       Signed:  Gabriela Irigoyen  [facility] 01/28/2015, 12:43 PM

## 2015-01-28 NOTE — Evaluation (Signed)
Physical Therapy Evaluation Patient Details Name: Ascencion DikeFrances A Lenz MRN: [id number] DOB: 22-Nov-1927 Today's Date: 01/28/2015   History of Present Illness  81 yo female with htn, Pafib, CVA, presents with AMS. Had been somnolent most of the day 5/23 which is unusual per her family.  Clinical Impression  Patient demonstrates deficits in functional mobility as indicated below. Will need continued skilled PT to address deficits and maximize function. Will see as indicated and progress as tolerated.    Follow Up Recommendations Supervision/Assistance - 24 hour    Equipment Recommendations  None recommended by PT    Recommendations for Other Services       Precautions / Restrictions Precautions Precautions: Fall Precaution Comments: patient with severe OA RLE      Mobility  Bed Mobility Overal bed mobility: Needs Assistance Bed Mobility: Supine to Sit;Sit to Supine     Supine to sit: Min assist Sit to supine: Min assist      Transfers Overall transfer level: Needs assistance Equipment used: Rolling walker (2 wheeled) Transfers: Sit to/from Stand Sit to Stand: Min assist         General transfer comment: VCs for hand placement  Ambulation/Gait Ambulation/Gait assistance: Min guard Ambulation Distance (Feet): 90 Feet Assistive device: Rolling walker (2 wheeled) Gait Pattern/deviations: Decreased stride length;Antalgic;Trunk flexed;Decreased weight shift to right;Decreased stance time - right;Step-to pattern Gait velocity: decreased  Gait velocity interpretation: <1.8 ft/sec, indicative of risk for recurrent falls    Stairs            Wheelchair Mobility    Modified Rankin (Stroke Patients Only)       Balance Overall balance assessment: History of Falls                                           Pertinent Vitals/Pain Pain Assessment: Faces Faces Pain Scale: Hurts even more Pain Location: RLE (knee) Pain Descriptors / Indicators:  Grimacing;Guarding Pain Intervention(s): Limited activity within patient's tolerance;Monitored during session;Repositioned    Home Living Family/patient expects to be discharged to:: Private residence Living Arrangements: Children Available Help at Discharge: Family;Available 24 hours/day Type of Home: House Home Access: Ramped entrance     Home Layout: One level Home Equipment: Bedside commode;Shower seat;Walker - 2 wheels;Wheelchair - manual;Cane - single point Additional Comments: Pt stated that she has BSC and a sits in a seat to shower PTA    Prior Function Level of Independence: Needs assistance   Gait / Transfers Assistance Needed: Per grandson, supervision to assist for mobility with use of RW  ADL's / Homemaking Assistance Needed: per grandson, patient required assist for ADLs and dressing        Hand Dominance   Dominant Hand: Right    Extremity/Trunk Assessment   Upper Extremity Assessment: Defer to OT evaluation           Lower Extremity Assessment: Generalized weakness;RLE deficits/detail RLE Deficits / Details: sever OA RLE (pt plans to have surgery next month)    Cervical / Trunk Assessment:  (increased body habitus)  Communication   Communication: HOH  Cognition Arousal/Alertness: Awake/alert Behavior During Therapy: WFL for tasks assessed/performed Overall Cognitive Status: Within Functional Limits for tasks assessed                      General Comments      Exercises  Assessment/Plan    PT Assessment Patient needs continued PT services  PT Diagnosis Difficulty walking;Abnormality of gait;Generalized weakness;Acute pain   PT Problem List Decreased strength;Decreased range of motion;Decreased activity tolerance;Decreased balance;Decreased mobility;Decreased coordination;Obesity;Pain  PT Treatment Interventions DME instruction;Gait training;Functional mobility training;Therapeutic activities;Therapeutic exercise;Balance  training;Patient/family education   PT Goals (Current goals can be found in the Care Plan section) Acute Rehab PT Goals Patient Stated Goal: to go home PT Goal Formulation: With patient/family Time For Goal Achievement: 02/11/15 Potential to Achieve Goals: Good    Frequency Min 3X/week   Barriers to discharge        Co-evaluation               End of Session   Activity Tolerance: Patient tolerated treatment well;Patient limited by fatigue;Patient limited by pain Patient left: in bed;with call bell/phone within reach;with bed alarm set;with family/visitor present Nurse Communication: Mobility status    Functional Assessment Tool Used: clinical judgement Functional Limitation: Mobility: Walking and moving around Mobility: Walking and Moving Around Current Status (Z6109): At least 20 percent but less than 40 percent impaired, limited or restricted Mobility: Walking and Moving Around Goal Status [phone]): At least 1 percent but less than 20 percent impaired, limited or restricted    Time: 1346-1404 PT Time Calculation (min) (ACUTE ONLY): 18 min   Charges:   PT Evaluation $Initial PT Evaluation Tier I: 1 Procedure     PT G Codes:   PT G-Codes **NOT FOR INPATIENT CLASS** Functional Assessment Tool Used: clinical judgement Functional Limitation: Mobility: Walking and moving around Mobility: Walking and Moving Around Current Status (U9811): At least 20 percent but less than 40 percent impaired, limited or restricted Mobility: Walking and Moving Around Goal Status [phone]): At least 1 percent but less than 20 percent impaired, limited or restricted    Fabio Asa 01/28/2015, 2:22 PM Charlotte Crumb, PT DPT  [phone]

## 2015-01-28 NOTE — ED Notes (Signed)
Report attempted 

## 2015-01-28 NOTE — Care Management Note (Signed)
Case Management Note  Patient Details  Name: Carrie Best MRN: [id number] Date of Birth: Jan 16, 1928  Subjective/Objective:                    Action/Plan:[facility] as used this agency in past. Made referral to rep for f/u.    Expected Discharge Date:                  Expected Discharge Plan:  Home w Home Health Services  In-House Referral:     Discharge planning Services  CM Consult [facility](HHRN ordered)  Post Acute Care Choice:  Home Health Choice offered to:     DME Arranged:    [facility]  [facility]  Status of Service:  Completed, signed off  Medicare Important Message Given:    Date Medicare IM Given:    Medicare IM give by:    Date Additional Medicare IM Given:    Additional Medicare Important Message give by:     If discussed at Long Length of Stay Meetings, dates discussed:    Additional Comments:  Yvone NeuCrutchfield, Carrie Erker M, RN 01/28/2015, 1:29 PM

## 2015-01-28 NOTE — H&P (Signed)
Carrie Best is an 81 y.o. female.    Carrie Best (pcp)  Chief Complaint: fatigue, somnolence, ams HPI: 81 yo female with htn, Pafib (CHADS2=5), CVA,  Has been somnolent today, sleeping all day which is unusual per her family.  No recent medication changes.  Pt denies taking more pain medication thank normal.  Pt was noted to have some hypercalcemia. Pt will be admitted for w/u p of hypercalcemia, ams  Past Medical History  Diagnosis Date  . Hypertension   . Arthritis   . Diastolic dysfunction   . Hyperlipidemia   . Congestive heart failure     Congestive heart failure symptoms  . Heart murmur   . Dysrhythmia 05-10-12    hx. A. Fib., rate is controlled-tx. Xarelto  . Pneumonia 05-10-12    dx. in July- tx. antibiotics  . Hearing loss 05-10-12    bilateral hearing aids.  Marland Kitchen Dyspnea 05-10-12    recent 2-3 weeks hospital visit 7'13 pneumonia.  Marland Kitchen GERD (gastroesophageal reflux disease) 05-10-12    tx. with meds as needed  . Chronic kidney disease 05-10-12    hx. recent kidney stones passed.  . Depression   . Stroke 05-10-12    Lt.CVA note on scans-hx. freq. falls/ none past 6 months 01/21/14  . Wears hearing aid     Past Surgical History  Procedure Laterality Date  . Appendectomy    . Other surgical history      Bladder Surgery  . Interstim implant placement  05-10-12    now malfunctioning  . Cataract extraction, bilateral  05-10-12    bilateral  . Interstim implant removal  05/16/2012    Procedure: REMOVAL OF INTERSTIM IMPLANT;  Surgeon: Reece Packer, MD;  Location: [facility];  Service: Urology;  Laterality: N/A;  . Cystoscopy with retrograde pyelogram, ureteroscopy and stent placement Right 01/31/2014    Procedure: CYSTOSCOPY WITH bilateral  RETROGRADE PYELOGRAM, right URETEROSCOPY AND right  STENT PLACEMENT, bladder biopsy with fulgeration;  Surgeon: Alexis Frock, MD;  Location: [facility];  Service: Urology;  Laterality: Right;  . Holmium laser application Right 0/24/0973    Procedure:  HOLMIUM LASER APPLICATION;  Surgeon: Alexis Frock, MD;  Location: [facility];  Service: Urology;  Laterality: Right;  . Humerus im nail Right 06/10/2014    Procedure: INTRAMEDULLARY (IM) NAIL HUMERAL;  Surgeon: Nita Sells, MD;  Location: [facility];  Service: Orthopedics;  Laterality: Right;  Right intramedullary humeral nail  . Hardware removal Right 09/09/2014    Procedure: HARDWARE REMOVAL RIGHT SHOULDER;  Surgeon: Nita Sells, MD;  Location: [facility];  Service: Orthopedics;  Laterality: Right;  Right shoulder hardware removal    Family History  Problem Relation Age of Onset  . Stroke Mother   . Stroke Brother    Social History:  reports that she has never smoked. She has never used smokeless tobacco. She reports that she does not drink alcohol or use illicit drugs.  Allergies:  Allergies  Allergen Reactions  . Codeine Nausea And Vomiting  . Penicillins Hives  . Sulfur Nausea And Vomiting   Medications reviewed  (Not in a hospital admission)  Results for orders placed or performed during the hospital encounter of 01/27/15 (from the past 48 hour(s))  CBC     Status: None   Collection Time: 01/27/15  9:49 PM  Result Value Ref Range   WBC 6.3 4.0 - 10.5 K/uL   RBC 4.27 3.87 - 5.11 MIL/uL   Hemoglobin 12.5 12.0 -  15.0 g/dL   HCT 37.0 36.0 - 46.0 %   MCV 86.7 78.0 - 100.0 fL   MCH 29.3 26.0 - 34.0 pg   MCHC 33.8 30.0 - 36.0 g/dL   RDW 15.5 11.5 - 15.5 %   Platelets PLATELET CLUMPS NOTED ON SMEAR, UNABLE TO ESTIMATE 150 - 400 K/uL  Basic metabolic panel     Status: Abnormal   Collection Time: 01/27/15  9:49 PM  Result Value Ref Range   Sodium 141 135 - 145 mmol/L   Potassium 3.9 3.5 - 5.1 mmol/L   Chloride 105 101 - 111 mmol/L   CO2 26 22 - 32 mmol/L   Glucose, Bld 114 (H) 65 - 99 mg/dL   BUN 12 6 - 20 mg/dL   Creatinine, Ser 0.95 0.44 - 1.00 mg/dL   Calcium 11.0 (H) 8.9 - 10.3 mg/dL   GFR calc non Af Amer 53 (L) >60 mL/min   GFR calc Af  Amer >60 >60 mL/min    Comment: (NOTE) The eGFR has been calculated using the CKD EPI equation. This calculation has not been validated in all clinical situations. eGFR's persistently <60 mL/min signify possible Chronic Kidney Disease.    Anion gap 10 5 - 15  I-stat troponin, ED     Status: None   Collection Time: 01/27/15  9:58 PM  Result Value Ref Range   Troponin i, poc 0.01 0.00 - 0.08 ng/mL   Comment 3            Comment: Due to the release kinetics of cTnI, a negative result within the first hours of the onset of symptoms does not rule out myocardial infarction with certainty. If myocardial infarction is still suspected, repeat the test at appropriate intervals.   I-Stat CG4 Lactic Acid, ED     Status: None   Collection Time: 01/27/15 10:07 PM  Result Value Ref Range   Lactic Acid, Venous 1.33 0.5 - 2.0 mmol/L  Urinalysis, Routine w reflex microscopic     Status: Abnormal   Collection Time: 01/27/15 10:25 PM  Result Value Ref Range   Color, Urine STRAW (A) YELLOW   APPearance HAZY (A) CLEAR   Specific Gravity, Urine 1.008 1.005 - 1.030   pH 6.5 5.0 - 8.0   Glucose, UA NEGATIVE NEGATIVE mg/dL   Hgb urine dipstick NEGATIVE NEGATIVE   Bilirubin Urine NEGATIVE NEGATIVE   Ketones, ur NEGATIVE NEGATIVE mg/dL   Protein, ur NEGATIVE NEGATIVE mg/dL   Urobilinogen, UA 0.2 0.0 - 1.0 mg/dL   Nitrite NEGATIVE NEGATIVE   Leukocytes, UA NEGATIVE NEGATIVE    Comment: MICROSCOPIC NOT DONE ON URINES WITH NEGATIVE PROTEIN, BLOOD, LEUKOCYTES, NITRITE, OR GLUCOSE <1000 mg/dL.   Dg Chest 2 View  01/27/2015   CLINICAL DATA:  Altered mental status.  Shortness of breath.  EXAM: CHEST  2 VIEW  COMPARISON:  01/22/2014; 05/17/2013  FINDINGS: Grossly unchanged cardiac silhouette and mediastinal contours with atherosclerotic plaque within the thoracic aorta. Minimal bibasilar opacities favored to represent atelectasis. Mild cephalization of flow with development of trace bilateral effusions. No  pneumothorax. Post intra medullary rod fixation of right humerus transfixing a comminuted fracture involving the surgical neck of the right humerus, incompletely evaluated.  IMPRESSION: Mild pulmonary edema, trace pleural effusions and associated bibasilar atelectasis.   Electronically Signed   By: Sandi Mariscal M.D.   On: 01/27/2015 23:35   Ct Head Wo Contrast  01/27/2015   CLINICAL DATA:  Altered mental status  EXAM: CT HEAD WITHOUT CONTRAST  TECHNIQUE: Contiguous axial images were obtained from the base of the skull through the vertex without intravenous contrast.  COMPARISON:  01/31/2012  FINDINGS: Skull and Sinuses:Negative for fracture or destructive process. The mastoids, middle ears, and imaged paranasal sinuses are clear.  Orbits: Bilateral cataract resection.  No acute findings.  Brain: No evidence of acute infarction, hemorrhage, hydrocephalus, or mass lesion/mass effect. There is generalized cerebral volume loss which is typical for age and stable from 2013. Extensive chronic small vessel disease with ischemic gliosis throughout the bilateral cerebral white matter. Stable ex vacuo ventriculomegaly.  IMPRESSION: 1. No acute findings. 2. Extensive chronic small vessel disease, stable from 2013.   Electronically Signed   By: Monte Fantasia M.D.   On: 01/27/2015 22:23    Review of Systems  Constitutional: Negative.   HENT: Negative.   Eyes: Negative.   Respiratory: Positive for cough. Negative for hemoptysis, sputum production, shortness of breath and wheezing.   Cardiovascular: Negative.   Gastrointestinal: Negative.   Genitourinary: Negative.   Musculoskeletal: Negative.   Skin: Negative.   Neurological: Negative.        Axox3  Endo/Heme/Allergies: Negative.   Psychiatric/Behavioral: Negative.     Blood pressure 185/69, pulse 97, temperature 98.8 F (37.1 C), temperature source Oral, resp. rate 14, height _0  (1.727 m), weight 86.637 kg (191 lb), SpO2 96 %. Physical Exam   Constitutional: She is oriented to person, place, and time. She appears well-developed and well-nourished.  HENT:  Head: Normocephalic and atraumatic.  Mouth/Throat: No oropharyngeal exudate.  Eyes: Conjunctivae and EOM are normal. Pupils are equal, round, and reactive to light. No scleral icterus.  Neck: Normal range of motion. Neck supple. No JVD present. No tracheal deviation present. No thyromegaly present.  Cardiovascular: Normal rate and regular rhythm.  Exam reveals no gallop and no friction rub.   No murmur heard. Respiratory: Effort normal and breath sounds normal. No respiratory distress. She has no wheezes. She has no rales.  GI: Soft. Bowel sounds are normal. She exhibits no distension. There is no tenderness. There is no rebound and no guarding.  Musculoskeletal: Normal range of motion. She exhibits no edema or tenderness.  Lymphadenopathy:    She has no cervical adenopathy.  Neurological: She is alert and oriented to person, place, and time. She has normal reflexes. She displays normal reflexes. No cranial nerve deficit. She exhibits normal muscle tone. Coordination normal.  Skin: Skin is warm and dry. No rash noted. No erythema. No pallor.  Psychiatric: She has a normal mood and affect. Her behavior is normal. Judgment and thought content normal.     Assessment/Plan Fatigue, AMS secondary to hypercalcemia Check MRI brain r/o occult cva Check V49, folic acid, esr, ana, rpr, tsh  Hypercalcemia Check cmp, ionized calcium , magnesium, phos, pth, pth rp,  Tsh, vitamin D 25-oh, 1,25-oh Check spep, upep  Hyperglycemia Check hga1c  Diarrhea Started yesterday.  Check stool for fecal leukocytes, culture, c. Diff.    Pafib  Cont xarelto  DVT prophylaxis:  Scd, Joaquin Courts 01/28/2015, 12:30 AM

## 2015-01-28 NOTE — Progress Notes (Signed)
Pt arrived on unit 0120 hrs, A&O, no obvious distress, no current c/o, Pt oriented to unit, MD notified of Pt arrival, admission orders being implemented

## 2015-01-29 LAB — PROTEIN ELECTROPHORESIS, SERUM
A/G RATIO SPE: 1.1 (ref 0.7–2.0)
ALBUMIN ELP: 3.2 g/dL (ref 3.2–5.6)
ALPHA-1-GLOBULIN: 0.2 g/dL (ref 0.1–0.4)
Alpha-2-Globulin: 0.7 g/dL (ref 0.4–1.2)
Beta Globulin: 0.9 g/dL (ref 0.6–1.3)
GLOBULIN, TOTAL: 2.9 g/dL (ref 2.0–4.5)
Gamma Globulin: 1.1 g/dL (ref 0.5–1.6)
Total Protein ELP: 6.1 g/dL (ref 6.0–8.5)

## 2015-01-29 LAB — UIFE/LIGHT CHAINS/TP QN, 24-HR UR
% BETA, Urine: 38.5 %
ALPHA 1 URINE: 9.8 %
ALPHA 2 UR: 9.3 %
Albumin, U: 32.6 %
Free Kappa/Lambda Ratio: 9.53 (ref 2.04–10.37)
Free Lambda Lt Chains,Ur: 1.91 mg/L (ref 0.24–6.66)
Free Lt Chn Excr Rate: 18.2 mg/L (ref 1.35–24.19)
GAMMA GLOBULIN URINE: 9.8 %
Total Protein, Urine: 13.5 mg/dL

## 2015-01-29 LAB — RPR: RPR: NONREACTIVE

## 2015-01-29 LAB — PARATHYROID HORMONE, INTACT (NO CA): PTH: 32 pg/mL (ref 15–65)

## 2015-01-29 LAB — VITAMIN D 25 HYDROXY (VIT D DEFICIENCY, FRACTURES): VIT D 25 HYDROXY: 9.5 ng/mL — AB (ref 30.0–100.0)

## 2015-01-29 LAB — CALCIUM, IONIZED: CALCIUM, IONIZED, SERUM: 5.4 mg/dL (ref 4.5–5.6)

## 2015-01-30 LAB — URINE CULTURE: Special Requests: NORMAL

## 2015-01-31 LAB — PTH-RELATED PEPTIDE: PTH-related peptide: <0.74 pmol/L

## 2015-02-01 LAB — VITAMIN D 1,25 DIHYDROXY
Vitamin D 1, 25 (OH)2 Total: 198 pg/mL
Vitamin D2 1, 25 (OH)2: <10 pg/mL
Vitamin D3 1, 25 (OH)2: 198 pg/mL

## 2015-02-06 ENCOUNTER — Ambulatory Visit (INDEPENDENT_AMBULATORY_CARE_PROVIDER_SITE_OTHER): Payer: Medicare Other | Admitting: Cardiovascular Disease

## 2015-02-06 ENCOUNTER — Other Ambulatory Visit (INDEPENDENT_AMBULATORY_CARE_PROVIDER_SITE_OTHER): Payer: Medicare Other

## 2015-02-06 ENCOUNTER — Encounter: Payer: Self-pay | Admitting: Cardiovascular Disease

## 2015-02-06 VITALS — BP 118/84 | HR 84 | Ht 68.0 in | Wt 202.0 lb

## 2015-02-06 DIAGNOSIS — I5032 Chronic diastolic (congestive) heart failure: Secondary | ICD-10-CM | POA: Insufficient documentation

## 2015-02-06 DIAGNOSIS — E785 Hyperlipidemia, unspecified: Secondary | ICD-10-CM | POA: Diagnosis not present

## 2015-02-06 DIAGNOSIS — I4891 Unspecified atrial fibrillation: Secondary | ICD-10-CM | POA: Diagnosis not present

## 2015-02-06 NOTE — Patient Instructions (Signed)
Medication Instructions:  Your physician recommends that you continue on your current medications as directed. Please refer to the Current Medication list given to you today.   Labwork: None Ordered   Testing/Procedures: None Ordered   Follow-Up: Your physician wants you to follow-up in: 1 year with Dr. Nahser.  You will receive a reminder letter in the mail two months in advance. If you don't receive a letter, please call our office to schedule the follow-up appointment.      

## 2015-02-06 NOTE — Progress Notes (Signed)
Cardiology Office Note   Date:  02/06/2015   ID:  Carrie Best, DOB 01-04-1928, MRN [id number]  PCP:  Lillia MountainGRIFFIN,JOHN JOSEPH, MD  Cardiologist:   Vesta MixerNahser, Shakyia Bosso J, MD   Chief Complaint  Patient presents with  . Follow-up    chronic diastolic CHF   1. Atrial fibrillation 2. Hypertension 3. Dementia 4. Diastolic dysfunction 5. Hyperlipidemia  History of Present Illness:  Carrie Best is an 81 yo who I have seen in the past. She was admitted with respiratory failure last year. She was found to She has developed some dementia. She has had frequent episodes of dyspnea. Typically with exertion. She does not eat any extra salt.   She still feels poorly - has good days and bad days   She is not able to walk any distance primarily due to leg weakness and leg pain.   Jan 16, 2014:  Carrie Best is doing oK. Able to do her normal activies with minimal dyspnea ( she does not do much).     February 06, 2015:  Carrie Best is a 81 y.o. female who presents for her  Atrial fib and chronic diastolic congestive heart failure. She was recently hospitalized for mental status changes ( hypersomulence)  No specific etiology was found   She needs to have knee surgery.   Will need to hold the Xarelto  Past Medical History  Diagnosis Date  . Hypertension   . Arthritis   . Diastolic dysfunction   . Hyperlipidemia   . Congestive heart failure     Congestive heart failure symptoms  . Heart murmur   . Dysrhythmia 05-10-12    hx. A. Fib., rate is controlled-tx. Xarelto  . Pneumonia 05-10-12    dx. in July- tx. antibiotics  . Hearing loss 05-10-12    bilateral hearing aids.  Marland Kitchen. Dyspnea 05-10-12    recent 2-3 weeks hospital visit 7'13 pneumonia.  Marland Kitchen. GERD (gastroesophageal reflux disease) 05-10-12    tx. with meds as needed  . Chronic kidney disease 05-10-12    hx. recent kidney stones passed.  . Depression   . Stroke 05-10-12    Lt.CVA note on scans-hx. freq. falls/ none past 6 months  01/21/14  . Wears hearing aid     Past Surgical History  Procedure Laterality Date  . Appendectomy    . Other surgical history      Bladder Surgery  . Interstim implant placement  05-10-12    now malfunctioning  . Cataract extraction, bilateral  05-10-12    bilateral  . Interstim implant removal  05/16/2012    Procedure: REMOVAL OF INTERSTIM IMPLANT;  Surgeon: Martina SinnerScott A MacDiarmid, MD;  Location: [facility];  Service: Urology;  Laterality: N/A;  . Cystoscopy with retrograde pyelogram, ureteroscopy and stent placement Right 01/31/2014    Procedure: CYSTOSCOPY WITH bilateral  RETROGRADE PYELOGRAM, right URETEROSCOPY AND right  STENT PLACEMENT, bladder biopsy with fulgeration;  Surgeon: Sebastian Acheheodore Manny, MD;  Location: [facility];  Service: Urology;  Laterality: Right;  . Holmium laser application Right 01/31/2014    Procedure: HOLMIUM LASER APPLICATION;  Surgeon: Sebastian Acheheodore Manny, MD;  Location: [facility];  Service: Urology;  Laterality: Right;  . Humerus im nail Right 06/10/2014    Procedure: INTRAMEDULLARY (IM) NAIL HUMERAL;  Surgeon: Mable ParisJustin William Chandler, MD;  Location[facility];  Service: Orthopedics;  Laterality: Right;  Right intramedullary humeral nail  . Hardware removal Right 09/09/2014    Procedure: HARDWARE REMOVAL RIGHT SHOULDER;  Surgeon: Mable ParisJustin William Chandler,  MD;  [facility];  Service: Orthopedics;  Laterality: Right;  Right shoulder hardware removal     Current Outpatient Prescriptions  Medication Sig Dispense Refill  . atorvastatin (LIPITOR) 20 MG tablet Take 20 mg by mouth daily.    Marland Kitchen azithromycin (ZITHROMAX) 250 MG tablet Take 250 mg by mouth daily.    Marland Kitchen diltiazem (DILACOR XR) 180 MG 24 hr capsule Take 180 mg by mouth every morning.    . DULoxetine (CYMBALTA) 60 MG capsule Take 60 mg by mouth every morning.     . fish oil-omega-3 fatty acids 1000 MG capsule Take 1 g by mouth daily.    . furosemide (LASIX) 40 MG tablet Take 40 mg by mouth daily.    Marland Kitchen  HYDROcodone-acetaminophen (NORCO) 5-325 MG per tablet Take 1 tablet by mouth every 4 (four) hours as needed. (Patient taking differently: Take 1 tablet by mouth every 4 (four) hours as needed for moderate pain. )  0  . LORazepam (ATIVAN) 0.5 MG tablet Take 0.5 mg by mouth every 8 (eight) hours as needed for anxiety.    . meloxicam (MOBIC) 15 MG tablet Take 15 mg by mouth daily.    . metoprolol tartrate (LOPRESSOR) 25 MG tablet Take 50 mg by mouth 2 (two) times daily.     . potassium chloride SA (K-DUR,KLOR-CON) 20 MEQ tablet Take 1 tablet (20 mEq total) by mouth daily. 30 tablet 11  . ranitidine (ZANTAC) 150 MG capsule Take 150 mg by mouth daily as needed. Reflux    . Rivaroxaban (XARELTO) 20 MG TABS Take 20 mg by mouth every morning.     . senna-docusate (SENOKOT-S) 8.6-50 MG per tablet Take 1 tablet by mouth 2 (two) times daily. While taking pain meds to prevent constipation 30 tablet 0  . traMADol (ULTRAM) 50 MG tablet Take 1 tablet (50 mg total) by mouth every 6 (six) hours as needed for moderate pain. Post-operatively 30 tablet 0  . zolpidem (AMBIEN) 10 MG tablet Take 10 mg by mouth at bedtime as needed. For sleep     No current facility-administered medications for this visit.    Allergies:   Codeine; Penicillins; and Sulfur    Social History:  The patient  reports that she has never smoked. She has never used smokeless tobacco. She reports that she does not drink alcohol or use illicit drugs.   Family History:  The patient's family history includes Stroke in her brother and mother.    ROS:  Please see the history of present illness.    Review of Systems: Constitutional:  denies fever, chills, diaphoresis, appetite change and fatigue.  HEENT: denies photophobia, eye pain, redness, hearing loss, ear pain, congestion, sore throat, rhinorrhea, sneezing, neck pain, neck stiffness and tinnitus.  Respiratory: denies SOB, DOE, cough, chest tightness, and wheezing.  Cardiovascular: denies  chest pain, palpitations and leg swelling.  Gastrointestinal: denies nausea, vomiting, abdominal pain, diarrhea, constipation, blood in stool.  Genitourinary: denies dysuria, urgency, frequency, hematuria, flank pain and difficulty urinating.  Musculoskeletal: denies  myalgias, back pain, joint swelling, arthralgias and gait problem.   Skin: denies pallor, rash and wound.  Neurological: denies dizziness, seizures, syncope, weakness, light-headedness, numbness and headaches.   Hematological: denies adenopathy, easy bruising, personal or family bleeding history.  Psychiatric/ Behavioral: denies suicidal ideation, mood changes, confusion, nervousness, sleep disturbance and agitation.       All other systems are reviewed and negative.    PHYSICAL EXAM: VS:  BP 118/84 mmHg  Pulse 84  Ht  (1.727 m)  Wt 91.627 kg (202 lb)  BMI 30.72 kg/m2 , BMI Body mass index is 30.72 kg/(m^2). GEN: Well nourished, well developed, in no acute distress HEENT: normal Neck: no JVD, carotid bruits, or masses Cardiac: Irreg. Irreg. ; no murmurs, rubs, or gallops,no edema  Respiratory:  clear to auscultation bilaterally, normal work of breathing GI: soft, nontender, nondistended, + BS MS: no deformity or atrophy Skin: warm and dry, no rash Neuro:  Strength and sensation are intact Psych: normal   EKG:  EKG is not ordered today.    Recent Labs: 01/27/2015: Hemoglobin 12.5; Platelets PLATELET CLUMPS NOTED ON SMEAR, UNABLE TO ESTIMATE 01/28/2015: ALT 11*; BUN 11; Creatinine 0.86; Magnesium 2.0; Potassium 3.5; Sodium 140; TSH 1.135    Lipid Panel No results found for: CHOL, TRIG, HDL, CHOLHDL, VLDL, LDLCALC, LDLDIRECT    Wt Readings from Last 3 Encounters:  02/06/15 91.627 kg (202 lb)  01/28/15 86.773 kg (191 lb 4.8 oz)  09/09/14 86.637 kg (191 lb)      Other studies Reviewed: Additional studies/ records that were reviewed today include: . Review of the above records  demonstrates:   ASSESSMENT AND PLAN:  1. Atrial fibrillation - stable.  She is on xarelto.  Will need to hold the xarelto for 2 days prior to her knee surgery   2. Hypertension - BP is well controlled.   3. Dementia  4. Chronic Diastolic dysfunction: stable .  She is at low risk for her upcoming knee surgery .   5. Hyperlipidemia : managed by Dr. Valentina Lucks    Current medicines are reviewed at length with the patient today.  The patient does not have concerns regarding medicines.  The following changes have been made:  no change  Labs/ tests ordered today include:  No orders of the defined types were placed in this encounter.     Disposition:   FU with me in 1 year      Charlise Giovanetti, Deloris Ping, MD  02/06/2015 11:54 AM    [facility] [address] [place], [place], [place]  [address] Phone: [phone]; Fax: [phone]   [facility]  [address] [place], [place]  [address] [phone]    Fax [phone]

## 2015-02-07 ENCOUNTER — Encounter (HOSPITAL_COMMUNITY)
Admission: RE | Admit: 2015-02-07 | Discharge: 2015-02-07 | Disposition: A | Discharge status: Home / Self Care | Payer: Medicare Other | Source: Ambulatory Visit | Attending: Orthopedic Surgery | Admitting: Orthopedic Surgery

## 2015-02-07 ENCOUNTER — Ambulatory Visit (HOSPITAL_COMMUNITY)
Admission: RE | Admit: 2015-02-07 | Discharge: 2015-02-07 | Disposition: A | Discharge status: Home / Self Care | Payer: Medicare Other | Source: Ambulatory Visit | Attending: Orthopedic Surgery | Admitting: Orthopedic Surgery

## 2015-02-07 ENCOUNTER — Encounter (HOSPITAL_COMMUNITY): Payer: Self-pay

## 2015-02-07 DIAGNOSIS — M179 Osteoarthritis of knee, unspecified: Secondary | ICD-10-CM | POA: Insufficient documentation

## 2015-02-07 DIAGNOSIS — Z01818 Encounter for other preprocedural examination: Secondary | ICD-10-CM

## 2015-02-07 HISTORY — DX: Personal history of urinary calculi: Z87.442

## 2015-02-07 HISTORY — DX: Anxiety disorder, unspecified: F41.9

## 2015-02-07 LAB — CBC WITH DIFFERENTIAL/PLATELET
Basophils Absolute: 0 K/uL (ref 0.0–0.1)
Basophils Relative: 0 % (ref 0–1)
Eosinophils Absolute: 0.1 K/uL (ref 0.0–0.7)
Eosinophils Relative: 1 % (ref 0–5)
HCT: 36.1 % (ref 36.0–46.0)
Hemoglobin: 12 g/dL (ref 12.0–15.0)
Lymphocytes Relative: 26 % (ref 12–46)
Lymphs Abs: 1.9 K/uL (ref 0.7–4.0)
MCH: 29.2 pg (ref 26.0–34.0)
MCHC: 33.2 g/dL (ref 30.0–36.0)
MCV: 87.8 fL (ref 78.0–100.0)
Monocytes Absolute: 0.5 K/uL (ref 0.1–1.0)
Monocytes Relative: 7 % (ref 3–12)
Neutro Abs: 4.7 K/uL (ref 1.7–7.7)
Neutrophils Relative %: 66 % (ref 43–77)
Platelets: 247 K/uL (ref 150–400)
RBC: 4.11 MIL/uL (ref 3.87–5.11)
RDW: 15.5 % (ref 11.5–15.5)
WBC: 7.2 K/uL (ref 4.0–10.5)

## 2015-02-07 LAB — TYPE AND SCREEN
ABO/RH(D): O POS
Antibody Screen: NEGATIVE

## 2015-02-07 LAB — URINALYSIS, ROUTINE W REFLEX MICROSCOPIC
Bilirubin Urine: NEGATIVE
Glucose, UA: NEGATIVE mg/dL
Hgb urine dipstick: NEGATIVE
Ketones, ur: NEGATIVE mg/dL
Nitrite: NEGATIVE
Protein, ur: NEGATIVE mg/dL
Specific Gravity, Urine: 1.007 (ref 1.005–1.030)
Urobilinogen, UA: 0.2 mg/dL (ref 0.0–1.0)
pH: 7 (ref 5.0–8.0)

## 2015-02-07 LAB — ABO/RH: ABO/RH(D): O POS

## 2015-02-07 LAB — SURGICAL PCR SCREEN
MRSA, PCR: NEGATIVE
Staphylococcus aureus: NEGATIVE

## 2015-02-07 LAB — PROTIME-INR
INR: 3 — AB (ref 0.00–1.49)
Prothrombin Time: 30.6 seconds — ABNORMAL HIGH (ref 11.6–15.2)

## 2015-02-07 LAB — BASIC METABOLIC PANEL WITH GFR
Anion gap: 7 (ref 5–15)
BUN: 11 mg/dL (ref 6–20)
CO2: 26 mmol/L (ref 22–32)
Calcium: 9.7 mg/dL (ref 8.9–10.3)
Chloride: 102 mmol/L (ref 101–111)
Creatinine, Ser: 1.07 mg/dL — ABNORMAL HIGH (ref 0.44–1.00)
GFR calc Af Amer: 53 mL/min — ABNORMAL LOW
GFR calc non Af Amer: 46 mL/min — ABNORMAL LOW
Glucose, Bld: 186 mg/dL — ABNORMAL HIGH (ref 65–99)
Potassium: 3.9 mmol/L (ref 3.5–5.1)
Sodium: 135 mmol/L (ref 135–145)

## 2015-02-07 LAB — URINE MICROSCOPIC-ADD ON

## 2015-02-07 LAB — APTT: aPTT: 50 s — ABNORMAL HIGH (ref 24–37)

## 2015-02-07 NOTE — Progress Notes (Addendum)
Anesthesia consult:  Pt is 81 year old female scheduled for R total knee arthroplasty on 02/17/2015 with Dr. Turner Danielsowan.   Cardiologist is Dr. Elease HashimotoNahser, last office visit 02/06/15 with follow up in one year. PCP is Dr. Kirby FunkJohn Griffin.   Pt recently hospitalized 5/23-5/24/16 for lethargy/somnolence for which no cause was identified.   Pt reports cough for at least last 1.5 weeks. Saw someone in PCP's office 5/28 for cough and runny nose. Prescribed zithromax. Pt is uncertain of reason for antibiotics/diagnosis. Pt reports she is feeling better. Still has occasional cough and yellow sputum, but feels sx are improving. Have requested office visit note from PCP's office to get an idea of the plan for this pt regarding her illness and upcoming surgery. Lungs are CTA B today at PAT.   PMH includes: Atrial fibrillation, stroke (05/2012), HTN, diastolic CHF, heart murmur, GERD, hyperlipidemia. Dr. Harvie BridgeNahser's note indicates pt has some dementia. Never smoker. BMI 29. S/p hardware removal R shoulder 09/09/14. S/p humeral IM nail 06/10/14. S/p cystoscopy, ureteroscopy and R stent placement 01/31/14. S/p removal of interstim implant 05/16/12.   Medications include: lipitor, zithromax, diltiazem, lasix, metoprolol, potassium, xarelto. Pt to stop xarelto 2 days prior to surgery.   Preoperative labs reviewed.  PTT 50. PT/INR 30.6/3.0. Will repeat coags DOS. Notified Kathy in Dr. Wadie Lessenowan's office of coag results.   Chest x-ray 02/07/2015 reviewed. Probable COPD.  Improved appearance of the pulmonary interstitium consistent with resolution of interstitial edema. There is no acute cardiopulmonary abnormality.   EKG 01/27/2015: Atrial fibrillation. Nonspecific IVCD with LAD. LVH with secondary repolarization abnormality. Anterior infarct, old.   Echo 03/20/2012: - Left ventricle: The cavity size was normal. Systolic function was normal. The estimated ejection fraction wasin the range of 50% to 55%. Possible hypokinesis of the mid-distal  anteroseptal myocardium (difficult windows). - Mitral valve: Mild regurgitation. - Left atrium: The atrium was mildly dilated. - Right ventricle: The cavity size was mildly dilated. - Right atrium: The atrium was mildly dilated.  Nuclear stress test 02/25/2009: Normal stress nuclear study. There is no evidence of ischemia. Normal LV function.  Will revisit when PCP's notes have arrived.   Rica Mastngela Kabbe, FNP-BC [facility]iology Phone: [phone] 02/07/2015 4:44 PM  Addendum: I received a call from nurse Alona BeneJoyce with Dr. Valentina LucksGriffin. He evaluated patient yesterday for cough follow-up and felt she was "OK" to proceed with planned surgery.  Velna Ochsllison Yael Angerer, PA-C [facility] Phone [phone] 02/11/2015 10:58 AM

## 2015-02-07 NOTE — Pre-Procedure Instructions (Signed)
Ascencion DikeFrances A Lagerquist 02/07/2015      [facility] [id number] [place] [place], [place] - [address] [facility] [address] [place] [id number] Phone: [phone] Fax: [phone]    Your procedure is scheduled on Monday February 17, 2015 at 1:30PM.  [facility]:30 [facility].M.  Call this number if you have problems the morning of surgery:  [phone]   Remember:  Do not eat food or drink liquids after midnight.  Take these medicines the morning of surgery with A SIP OF WATER antibiotic if still taking, diltiazem (Dilacor XR), duloxetine (Cymbalta), pain medication if needed, Lorazepam (Ativan), metoprolol (Lopressor), ranitidine (Zantac).  Do not take any aspirin, NSAIDS (advil, aleve, naproxen, ibuprofen), vitamins, supplements, fish oil, meloxicam (Mobic) and/or herbal medications five days prior to surgery starting Wednesday February 12, 2015.  STOP XARELTO TWO DAYS BEFORE TO SURGERY PER DR. Elease HashimotoNAHSER.   Do not wear jewelry, make-up or nail polish.  Do not wear lotions, powders, or perfumes.  You may wear deodorant.  Do not shave 48 hours prior to surgery.  Men may shave face and neck.  Do not bring valuables to the hospital.   [facility] any belongings or valuables.  Contacts, dentures or bridgework may not be worn into surgery.  Leave your suitcase in the car.  After surgery it may be brought to your room.  For patients admitted to the hospital, discharge time will be determined by your treatment team.  Patients discharged the day of surgery will not be allowed to drive home.  SEE SHOWER INSTRUCTION SHEET   Please read over the following fact sheets that you were given.  Pain Booklet, Coughing and Deep Breathing, Blood Transfusion Information, Total Joint Packet, MRSA Information and Surgical Site Infection Prevention

## 2015-02-08 ENCOUNTER — Other Ambulatory Visit: Payer: Self-pay | Admitting: Cardiovascular Disease

## 2015-02-14 DIAGNOSIS — M1711 Unilateral primary osteoarthritis, right knee: Secondary | ICD-10-CM | POA: Diagnosis present

## 2015-02-14 MED ORDER — BUPIVACAINE LIPOSOME 1.3 % IJ SUSP
20.0000 mL | Freq: Once | INTRAMUSCULAR | Status: AC
  Administered 2015-02-17: 20 mL
  Filled 2015-02-14: qty 20

## 2015-02-14 NOTE — Consult Note (Signed)
02/14/2015, 1:11 PM   Pharmacy Note   DRUG ALERTRE: IV Tranexamic Acid for planned surgery on 02/17/15.   Patient has atrial fibrillation and is on CHRONIC ANTICOAGULATION with Xarelto for VTE prophylaxis.  IV Tranexamic Acid is contraindicated in this case.   The order for IV Tranexamic Acid will not be released.   RECOMMENDATION:Change IV Tranexamic acid to Topical Irrigation.  Thank you. Samay Delcarlo J,Pharm.D.

## 2015-02-14 NOTE — H&P (Signed)
TOTAL KNEE ADMISSION H&P  Patient is being admitted for right total knee arthroplasty.  Subjective:  Chief Complaint:right knee pain.  HPI: Carrie Best, 81 y.o. female, has a history of pain and functional disability in the right knee due to arthritis and has failed non-surgical conservative treatments for greater than 12 weeks to includeNSAID's and/or analgesics, viscosupplementation injections, flexibility and strengthening excercises, use of assistive devices, weight reduction as appropriate and activity modification.  Onset of symptoms was gradual, starting 6 years ago with gradually worsening course since that time. The patient noted prior procedures on the knee to include  arthroscopy on the right knee(s).  Patient currently rates pain in the right knee(s) at 10 out of 10 with activity. Patient has night pain, worsening of pain with activity and weight bearing, pain that interferes with activities of daily living, pain with passive range of motion and crepitus.  Patient has evidence of joint subluxation and joint space narrowing by imaging studies. . There is no active infection.  Patient Active Problem List   Diagnosis Date Noted  . Chronic diastolic CHF (congestive heart failure) 02/06/2015  . Altered mental status 01/28/2015  . Cough 01/28/2015  . Hypercalcemia 01/28/2015  . Hyperglycemia 01/28/2015  . Proximal humerus fracture 06/10/2014  . Acute on chronic diastolic congestive heart failure 03/18/2012  . Healthcare-associated pneumonia 03/18/2012  . Obesity 03/18/2012  . Diarrhea 03/18/2012  . HTN (hypertension) 03/18/2012  . GERD (gastroesophageal reflux disease) 03/18/2012  . Hyperlipidemia 03/18/2012  . Sleep disturbance 03/18/2012  . Atrial fibrillation 03/18/2012   Past Medical History  Diagnosis Date  . Hypertension   . Arthritis   . Diastolic dysfunction   . Hyperlipidemia   . Congestive heart failure     Congestive heart failure symptoms  . Heart murmur    . Dysrhythmia 05-10-12    hx. A. Fib., rate is controlled-tx. Xarelto  . Pneumonia 05-10-12    dx. in July- tx. antibiotics  . Hearing loss 05-10-12    bilateral hearing aids.  Marland Kitchen GERD (gastroesophageal reflux disease) 05-10-12    tx. with meds as needed  . Depression   . Stroke 05-10-12    Lt.CVA note on scans-hx. freq. falls/ none past 6 months 01/21/14  . Wears hearing aid     bilateral  . Dyspnea     occasionally, with exertion  . Anxiety   . History of kidney stones     Past Surgical History  Procedure Laterality Date  . Appendectomy    . Other surgical history      Bladder Surgery  . Interstim implant placement  05-10-12    now malfunctioning  . Cataract extraction, bilateral  05-10-12    bilateral  . Interstim implant removal  05/16/2012    Procedure: REMOVAL OF INTERSTIM IMPLANT;  Surgeon: Martina Sinner, MD;  Location: [facility];  Service: Urology;  Laterality: N/A;  . Cystoscopy with retrograde pyelogram, ureteroscopy and stent placement Right 01/31/2014    Procedure: CYSTOSCOPY WITH bilateral  RETROGRADE PYELOGRAM, right URETEROSCOPY AND right  STENT PLACEMENT, bladder biopsy with fulgeration;  Surgeon: Sebastian Ache, MD;  Location: [facility];  Service: Urology;  Laterality: Right;  . Holmium laser application Right 01/31/2014    Procedure: HOLMIUM LASER APPLICATION;  Surgeon: Sebastian Ache, MD;  Location: [facility];  Service: Urology;  Laterality: Right;  . Humerus im nail Right 06/10/2014    Procedure: INTRAMEDULLARY (IM) NAIL HUMERAL;  Surgeon: Mable Paris, MD;  Location: [facility];  Service: Orthopedics;  Laterality: Right;  Right intramedullary humeral nail  . Hardware removal Right 09/09/2014    Procedure: HARDWARE REMOVAL RIGHT SHOULDER;  Surgeon: Mable Paris, MD;  Location: [facility];  Service: Orthopedics;  Laterality: Right;  Right shoulder hardware removal    No prescriptions prior to admission   Allergies  Allergen Reactions  . Codeine  Nausea And Vomiting  . Penicillins Hives  . Sulfur Nausea And Vomiting    History  Substance Use Topics  . Smoking status: Never Smoker   . Smokeless tobacco: Never Used  . Alcohol Use: No    Family History  Problem Relation Age of Onset  . Stroke Mother   . Stroke Brother      Review of Systems  Constitutional: Positive for malaise/fatigue.  HENT: Positive for hearing loss.        Difficulty swallowing  Eyes: Negative.   Respiratory: Negative.   Cardiovascular: Negative.   Gastrointestinal: Negative.   Genitourinary: Negative.   Musculoskeletal: Positive for joint pain.  Skin: Negative.   Neurological: Negative.   Endo/Heme/Allergies: Negative.   Psychiatric/Behavioral: Negative.     Objective:  Physical Exam  Constitutional: She is oriented to person, place, and time. She appears well-developed and well-nourished.  HENT:  Head: Normocephalic and atraumatic.  Eyes: Pupils are equal, round, and reactive to light.  Neck: Normal range of motion. Neck supple.  Cardiovascular: Intact distal pulses.   Respiratory: Effort normal.  Musculoskeletal: She exhibits tenderness.  The right knee has not obvious valgus deformity of 10, range of motion is 0/130, tender along the lateral joint line with crepitus, also subpatellar crepitus and a 1+ effusion.  Medial collateral ligament is stable.    Neurological: She is alert and oriented to person, place, and time.  Skin: Skin is warm and dry.  Psychiatric: She has a normal mood and affect. Her behavior is normal. Judgment and thought content normal.    Vital signs in last 24 hours:    Labs:   Estimated body mass index is 29.05 kg/(m^2) as calculated from the following:   Height as of 09/09/14: 5\' 8"  (1.727 m).   Weight as of 09/09/14: 86.637 kg (191 lb).   Imaging Review Plain radiographs demonstrate bone-on-bone arthritis lateral compartment right knee with subluxation of tibia beneath the femur.  Assessment/Plan:  End  stage arthritis, right knee   The patient history, physical examination, clinical judgment of the provider and imaging studies are consistent with end stage degenerative joint disease of the right knee(s) and total knee arthroplasty is deemed medically necessary. The treatment options including medical management, injection therapy arthroscopy and arthroplasty were discussed at length. The risks and benefits of total knee arthroplasty were presented and reviewed. The risks due to aseptic loosening, infection, stiffness, patella tracking problems, thromboembolic complications and other imponderables were discussed. The patient acknowledged the explanation, agreed to proceed with the plan and consent was signed. Patient is being admitted for inpatient treatment for surgery, pain control, PT, OT, prophylactic antibiotics, VTE prophylaxis, progressive ambulation and ADL's and discharge planning. The patient is planning to be discharged home with home health services

## 2015-02-14 NOTE — Progress Notes (Signed)
Called and spoke with pt's dtr/caregiver-who will be transporting pt to hospital-regarding arrival time of 1030, understanding verbalized.

## 2015-02-17 ENCOUNTER — Inpatient Hospital Stay (HOSPITAL_COMMUNITY): Payer: Medicare Other | Admitting: Emergency Medicine

## 2015-02-17 ENCOUNTER — Inpatient Hospital Stay (HOSPITAL_COMMUNITY): Payer: Medicare Other | Admitting: Certified Registered Nurse Anesthetist

## 2015-02-17 ENCOUNTER — Encounter (HOSPITAL_COMMUNITY)
Admission: RE | Disposition: A | Discharge status: Skilled Nursing Facility | Payer: Self-pay | Source: Ambulatory Visit | Attending: Orthopedic Surgery

## 2015-02-17 ENCOUNTER — Inpatient Hospital Stay (HOSPITAL_COMMUNITY)
Admission: RE | Admit: 2015-02-17 | Discharge: 2015-02-20 | DRG: 470 | Disposition: A | Discharge status: Skilled Nursing Facility | Payer: Medicare Other | Source: Ambulatory Visit | Attending: Orthopedic Surgery | Admitting: Orthopedic Surgery

## 2015-02-17 ENCOUNTER — Encounter (HOSPITAL_COMMUNITY): Payer: Self-pay | Admitting: Certified Registered Nurse Anesthetist

## 2015-02-17 DIAGNOSIS — E785 Hyperlipidemia, unspecified: Secondary | ICD-10-CM | POA: Diagnosis present

## 2015-02-17 DIAGNOSIS — Z880 Allergy status to penicillin: Secondary | ICD-10-CM

## 2015-02-17 DIAGNOSIS — E669 Obesity, unspecified: Secondary | ICD-10-CM | POA: Diagnosis present

## 2015-02-17 DIAGNOSIS — Z8673 Personal history of transient ischemic attack (TIA), and cerebral infarction without residual deficits: Secondary | ICD-10-CM

## 2015-02-17 DIAGNOSIS — Z823 Family history of stroke: Secondary | ICD-10-CM

## 2015-02-17 DIAGNOSIS — Z7901 Long term (current) use of anticoagulants: Secondary | ICD-10-CM

## 2015-02-17 DIAGNOSIS — Z885 Allergy status to narcotic agent status: Secondary | ICD-10-CM

## 2015-02-17 DIAGNOSIS — F329 Major depressive disorder, single episode, unspecified: Secondary | ICD-10-CM | POA: Diagnosis present

## 2015-02-17 DIAGNOSIS — I10 Essential (primary) hypertension: Secondary | ICD-10-CM | POA: Diagnosis present

## 2015-02-17 DIAGNOSIS — F419 Anxiety disorder, unspecified: Secondary | ICD-10-CM | POA: Diagnosis present

## 2015-02-17 DIAGNOSIS — K219 Gastro-esophageal reflux disease without esophagitis: Secondary | ICD-10-CM | POA: Diagnosis present

## 2015-02-17 DIAGNOSIS — M1711 Unilateral primary osteoarthritis, right knee: Principal | ICD-10-CM | POA: Diagnosis present

## 2015-02-17 DIAGNOSIS — D62 Acute posthemorrhagic anemia: Secondary | ICD-10-CM | POA: Diagnosis not present

## 2015-02-17 DIAGNOSIS — M1710 Unilateral primary osteoarthritis, unspecified knee: Secondary | ICD-10-CM | POA: Diagnosis present

## 2015-02-17 DIAGNOSIS — I5032 Chronic diastolic (congestive) heart failure: Secondary | ICD-10-CM | POA: Diagnosis present

## 2015-02-17 DIAGNOSIS — H9190 Unspecified hearing loss, unspecified ear: Secondary | ICD-10-CM | POA: Diagnosis present

## 2015-02-17 DIAGNOSIS — Z87442 Personal history of urinary calculi: Secondary | ICD-10-CM

## 2015-02-17 DIAGNOSIS — I4891 Unspecified atrial fibrillation: Secondary | ICD-10-CM | POA: Diagnosis present

## 2015-02-17 DIAGNOSIS — Z6829 Body mass index (BMI) 29.0-29.9, adult: Secondary | ICD-10-CM

## 2015-02-17 DIAGNOSIS — Z882 Allergy status to sulfonamides status: Secondary | ICD-10-CM

## 2015-02-17 HISTORY — PX: TOTAL KNEE ARTHROPLASTY: SHX125

## 2015-02-17 LAB — PROTIME-INR
INR: 3.27 — ABNORMAL HIGH (ref 0.00–1.49)
INR: 2.85 — ABNORMAL HIGH (ref 0.00–1.49)
Prothrombin Time: 32.6 seconds — ABNORMAL HIGH (ref 11.6–15.2)
Prothrombin Time: 29.5 seconds — ABNORMAL HIGH (ref 11.6–15.2)

## 2015-02-17 LAB — APTT
APTT: 48 s — AB (ref 24–37)
aPTT: 42 seconds — ABNORMAL HIGH (ref 24–37)

## 2015-02-17 SURGERY — ARTHROPLASTY, KNEE, TOTAL
Anesthesia: Monitor Anesthesia Care | Site: Knee | Laterality: Right

## 2015-02-17 MED ORDER — HYDROMORPHONE HCL 1 MG/ML IJ SOLN
0.5000 mg | INTRAMUSCULAR | Status: DC | PRN
  Administered 2015-02-17: 0.5 mg via INTRAVENOUS
  Filled 2015-02-17: qty 1

## 2015-02-17 MED ORDER — PROPOFOL INFUSION 10 MG/ML OPTIME
INTRAVENOUS | Status: DC | PRN
  Administered 2015-02-17: 25 ug/kg/min via INTRAVENOUS

## 2015-02-17 MED ORDER — ACETAMINOPHEN 650 MG RE SUPP: 650.0000 mg | Freq: Four times a day (QID) | RECTAL | Status: DC | PRN

## 2015-02-17 MED ORDER — METOCLOPRAMIDE HCL 5 MG/ML IJ SOLN: 5.0000 mg | Freq: Three times a day (TID) | INTRAMUSCULAR | Status: DC | PRN

## 2015-02-17 MED ORDER — LORAZEPAM 0.5 MG PO TABS: 0.5000 mg | ORAL_TABLET | Freq: Three times a day (TID) | ORAL | Status: DC | PRN

## 2015-02-17 MED ORDER — DOCUSATE SODIUM 100 MG PO CAPS
100.0000 mg | ORAL_CAPSULE | Freq: Two times a day (BID) | ORAL | Status: DC
  Administered 2015-02-17 – 2015-02-20 (×6): 100 mg via ORAL
  Filled 2015-02-17 (×6): qty 1

## 2015-02-17 MED ORDER — SODIUM CHLORIDE 0.9 % IV SOLN
2000.0000 mg | INTRAVENOUS | Status: DC
  Filled 2015-02-17: qty 20

## 2015-02-17 MED ORDER — ONDANSETRON HCL 4 MG/2ML IJ SOLN: 4.0000 mg | Freq: Once | INTRAMUSCULAR | Status: DC | PRN

## 2015-02-17 MED ORDER — DIPHENHYDRAMINE HCL 12.5 MG/5ML PO ELIX
12.5000 mg | ORAL_SOLUTION | ORAL | Status: DC | PRN
Start: 2015-02-17 — End: 2015-02-20

## 2015-02-17 MED ORDER — DULOXETINE HCL 60 MG PO CPEP
60.0000 mg | ORAL_CAPSULE | Freq: Every morning | ORAL | Status: DC
  Administered 2015-02-18 – 2015-02-20 (×3): 60 mg via ORAL
  Filled 2015-02-17 (×3): qty 1

## 2015-02-17 MED ORDER — VANCOMYCIN HCL IN DEXTROSE 1-5 GM/200ML-% IV SOLN
1000.0000 mg | INTRAVENOUS | Status: AC
  Administered 2015-02-17 (×2): 1000 mg via INTRAVENOUS
  Filled 2015-02-17: qty 200

## 2015-02-17 MED ORDER — ALUM & MAG HYDROXIDE-SIMETH 200-200-20 MG/5ML PO SUSP: 30.0000 mL | ORAL | Status: DC | PRN

## 2015-02-17 MED ORDER — 0.9 % SODIUM CHLORIDE (POUR BTL) OPTIME
TOPICAL | Status: DC | PRN
  Administered 2015-02-17: 1000 mL

## 2015-02-17 MED ORDER — FENTANYL CITRATE (PF) 250 MCG/5ML IJ SOLN
INTRAMUSCULAR | Status: AC
  Filled 2015-02-17: qty 5

## 2015-02-17 MED ORDER — FENTANYL CITRATE (PF) 100 MCG/2ML IJ SOLN
INTRAMUSCULAR | Status: DC | PRN
  Administered 2015-02-17: 50 ug via INTRAVENOUS

## 2015-02-17 MED ORDER — CHLORHEXIDINE GLUCONATE 4 % EX LIQD: 60.0000 mL | Freq: Once | CUTANEOUS | Status: DC

## 2015-02-17 MED ORDER — KCL IN DEXTROSE-NACL 20-5-0.45 MEQ/L-%-% IV SOLN
INTRAVENOUS | Status: DC
  Administered 2015-02-17: 125 mL/h via INTRAVENOUS
  Administered 2015-02-18: 07:00:00 via INTRAVENOUS
  Filled 2015-02-17 (×2): qty 1000

## 2015-02-17 MED ORDER — SODIUM CHLORIDE 0.9 % IJ SOLN
INTRAMUSCULAR | Status: DC | PRN
  Administered 2015-02-17 (×4): 10 mL via INTRAVENOUS

## 2015-02-17 MED ORDER — ACETAMINOPHEN 325 MG PO TABS: 650.0000 mg | ORAL_TABLET | Freq: Four times a day (QID) | ORAL | Status: DC | PRN

## 2015-02-17 MED ORDER — LACTATED RINGERS IV SOLN
INTRAVENOUS | Status: DC
  Administered 2015-02-17 (×2): via INTRAVENOUS

## 2015-02-17 MED ORDER — SODIUM CHLORIDE 0.9 % IR SOLN
Status: DC | PRN
  Administered 2015-02-17: 1000 mL
  Administered 2015-02-17: 3000 mL

## 2015-02-17 MED ORDER — METHOCARBAMOL 500 MG PO TABS
500.0000 mg | ORAL_TABLET | Freq: Four times a day (QID) | ORAL | Status: DC | PRN
  Administered 2015-02-17: 500 mg via ORAL
  Filled 2015-02-17 (×2): qty 1

## 2015-02-17 MED ORDER — ONDANSETRON HCL 4 MG PO TABS
4.0000 mg | ORAL_TABLET | Freq: Four times a day (QID) | ORAL | Status: DC | PRN
  Administered 2015-02-18: 4 mg via ORAL
  Filled 2015-02-17: qty 1

## 2015-02-17 MED ORDER — CEFUROXIME SODIUM 1.5 G IJ SOLR
INTRAMUSCULAR | Status: AC
  Filled 2015-02-17: qty 1.5

## 2015-02-17 MED ORDER — MENTHOL 3 MG MT LOZG: 1.0000 | LOZENGE | OROMUCOSAL | Status: DC | PRN

## 2015-02-17 MED ORDER — ATORVASTATIN CALCIUM 10 MG PO TABS
20.0000 mg | ORAL_TABLET | Freq: Every day | ORAL | Status: DC
  Administered 2015-02-17 – 2015-02-20 (×4): 20 mg via ORAL
  Filled 2015-02-17 (×5): qty 2

## 2015-02-17 MED ORDER — BISACODYL 5 MG PO TBEC
5.0000 mg | DELAYED_RELEASE_TABLET | Freq: Every day | ORAL | Status: DC | PRN
  Administered 2015-02-20: 5 mg via ORAL
  Filled 2015-02-17 (×2): qty 1

## 2015-02-17 MED ORDER — MELOXICAM 15 MG PO TABS
15.0000 mg | ORAL_TABLET | Freq: Every day | ORAL | Status: DC
  Administered 2015-02-18 – 2015-02-20 (×3): 15 mg via ORAL
  Filled 2015-02-17 (×3): qty 1

## 2015-02-17 MED ORDER — ONDANSETRON HCL 4 MG/2ML IJ SOLN
INTRAMUSCULAR | Status: DC | PRN
  Administered 2015-02-17: 4 mg via INTRAVENOUS

## 2015-02-17 MED ORDER — PHENYLEPHRINE HCL 10 MG/ML IJ SOLN
10.0000 mg | INTRAMUSCULAR | Status: DC | PRN
  Administered 2015-02-17: 15 ug/min via INTRAVENOUS

## 2015-02-17 MED ORDER — METHOCARBAMOL 1000 MG/10ML IJ SOLN
500.0000 mg | Freq: Four times a day (QID) | INTRAVENOUS | Status: DC | PRN
  Filled 2015-02-17: qty 5

## 2015-02-17 MED ORDER — OXYCODONE HCL 5 MG/5ML PO SOLN: 5.0000 mg | Freq: Once | ORAL | Status: DC | PRN

## 2015-02-17 MED ORDER — FAMOTIDINE 20 MG PO TABS: 10.0000 mg | ORAL_TABLET | Freq: Every day | ORAL | Status: DC | PRN

## 2015-02-17 MED ORDER — PHENOL 1.4 % MT LIQD: 1.0000 | OROMUCOSAL | Status: DC | PRN

## 2015-02-17 MED ORDER — FUROSEMIDE 40 MG PO TABS
40.0000 mg | ORAL_TABLET | Freq: Every day | ORAL | Status: DC
  Administered 2015-02-18 – 2015-02-20 (×3): 40 mg via ORAL
  Filled 2015-02-17 (×3): qty 1

## 2015-02-17 MED ORDER — METOCLOPRAMIDE HCL 5 MG PO TABS
5.0000 mg | ORAL_TABLET | Freq: Three times a day (TID) | ORAL | Status: DC | PRN
Start: 2015-02-17 — End: 2015-02-20

## 2015-02-17 MED ORDER — FENTANYL CITRATE (PF) 100 MCG/2ML IJ SOLN: 25.0000 ug | INTRAMUSCULAR | Status: DC | PRN

## 2015-02-17 MED ORDER — PHENYLEPHRINE HCL 10 MG/ML IJ SOLN
INTRAMUSCULAR | Status: DC | PRN
  Administered 2015-02-17 (×2): 80 ug via INTRAVENOUS

## 2015-02-17 MED ORDER — FLEET ENEMA 7-19 GM/118ML RE ENEM: 1.0000 | ENEMA | Freq: Once | RECTAL | Status: AC | PRN

## 2015-02-17 MED ORDER — LIDOCAINE HCL (CARDIAC) 20 MG/ML IV SOLN
INTRAVENOUS | Status: DC | PRN
  Administered 2015-02-17: 50 mg via INTRAVENOUS

## 2015-02-17 MED ORDER — DILTIAZEM HCL ER 180 MG PO CP24
180.0000 mg | ORAL_CAPSULE | Freq: Every morning | ORAL | Status: DC
  Administered 2015-02-18 – 2015-02-20 (×3): 180 mg via ORAL
  Filled 2015-02-17 (×6): qty 1

## 2015-02-17 MED ORDER — RIVAROXABAN 10 MG PO TABS
10.0000 mg | ORAL_TABLET | Freq: Every day | ORAL | Status: DC
  Administered 2015-02-18: 10 mg via ORAL
  Filled 2015-02-17: qty 1

## 2015-02-17 MED ORDER — OXYCODONE HCL 5 MG PO TABS
5.0000 mg | ORAL_TABLET | ORAL | Status: DC | PRN
  Administered 2015-02-17 – 2015-02-18 (×4): 10 mg via ORAL
  Administered 2015-02-18 – 2015-02-20 (×6): 5 mg via ORAL
  Filled 2015-02-17: qty 1
  Filled 2015-02-17: qty 2
  Filled 2015-02-17: qty 1
  Filled 2015-02-17: qty 2
  Filled 2015-02-17 (×3): qty 1
  Filled 2015-02-17 (×2): qty 2
  Filled 2015-02-17: qty 1

## 2015-02-17 MED ORDER — SENNOSIDES-DOCUSATE SODIUM 8.6-50 MG PO TABS: 1.0000 | ORAL_TABLET | Freq: Every evening | ORAL | Status: DC | PRN

## 2015-02-17 MED ORDER — OXYCODONE HCL 5 MG PO TABS: 5.0000 mg | ORAL_TABLET | Freq: Once | ORAL | Status: DC | PRN

## 2015-02-17 MED ORDER — CEFUROXIME SODIUM 1.5 G IJ SOLR
INTRAMUSCULAR | Status: DC | PRN
  Administered 2015-02-17: 1.5 g

## 2015-02-17 MED ORDER — METOPROLOL TARTRATE 50 MG PO TABS
50.0000 mg | ORAL_TABLET | Freq: Two times a day (BID) | ORAL | Status: DC
  Administered 2015-02-17 – 2015-02-20 (×6): 50 mg via ORAL
  Filled 2015-02-17 (×6): qty 1

## 2015-02-17 MED ORDER — BUPIVACAINE IN DEXTROSE 0.75-8.25 % IT SOLN
INTRATHECAL | Status: DC | PRN
  Administered 2015-02-17: 1.6 mL via INTRATHECAL

## 2015-02-17 MED ORDER — ONDANSETRON HCL 4 MG/2ML IJ SOLN
4.0000 mg | Freq: Four times a day (QID) | INTRAMUSCULAR | Status: DC | PRN
  Administered 2015-02-17: 4 mg via INTRAVENOUS
  Filled 2015-02-17: qty 2

## 2015-02-17 SURGICAL SUPPLY — 67 items
BANDAGE ESMARK 6X9 LF (GAUZE/BANDAGES/DRESSINGS) ×1 IMPLANT
BLADE SAG 18X100X1.27 (BLADE) ×2 IMPLANT
BLADE SAW SGTL 13X75X1.27 (BLADE) ×2 IMPLANT
BNDG CMPR 9X6 STRL LF SNTH (GAUZE/BANDAGES/DRESSINGS) ×1
BNDG CMPR MED 10X6 ELC LF (GAUZE/BANDAGES/DRESSINGS) ×1
BNDG ELASTIC 6X10 VLCR STRL LF (GAUZE/BANDAGES/DRESSINGS) ×2 IMPLANT
BNDG ESMARK 6X9 LF (GAUZE/BANDAGES/DRESSINGS) ×2
BOWL SMART MIX CTS (DISPOSABLE) ×2 IMPLANT
CAPT KNEE TOTAL 3 ATTUNE ×1 IMPLANT
CEMENT HV SMART SET (Cement) ×4 IMPLANT
COVER SURGICAL LIGHT HANDLE (MISCELLANEOUS) ×2 IMPLANT
CUFF TOURNIQUET SINGLE 34IN LL (TOURNIQUET CUFF) ×1 IMPLANT
DRAPE EXTREMITY T 121X128X90 (DRAPE) ×2 IMPLANT
DRAPE IMP U-DRAPE 54X76 (DRAPES) ×2 IMPLANT
DRAPE U-SHAPE 47X51 STRL (DRAPES) ×2 IMPLANT
DURAPREP 26ML APPLICATOR (WOUND CARE) ×4 IMPLANT
ELECT CAUTERY BLADE 6.4 (BLADE) ×1 IMPLANT
ELECT REM PT RETURN 9FT ADLT (ELECTROSURGICAL) ×2
ELECTRODE REM PT RTRN 9FT ADLT (ELECTROSURGICAL) ×1 IMPLANT
EVACUATOR 1/8 PVC DRAIN (DRAIN) IMPLANT
GAUZE SPONGE 4X4 12PLY STRL (GAUZE/BANDAGES/DRESSINGS) ×4 IMPLANT
GAUZE XEROFORM 1X8 LF (GAUZE/BANDAGES/DRESSINGS) ×2 IMPLANT
GLOVE BIO SURGEON STRL SZ7.5 (GLOVE) ×3 IMPLANT
GLOVE BIO SURGEON STRL SZ8.5 (GLOVE) ×2 IMPLANT
GLOVE BIOGEL PI IND STRL 6.5 (GLOVE) IMPLANT
GLOVE BIOGEL PI IND STRL 8 (GLOVE) ×1 IMPLANT
GLOVE BIOGEL PI IND STRL 9 (GLOVE) ×1 IMPLANT
GLOVE BIOGEL PI INDICATOR 6.5 (GLOVE) ×1
GLOVE BIOGEL PI INDICATOR 8 (GLOVE) ×2
GLOVE BIOGEL PI INDICATOR 9 (GLOVE) ×1
GLOVE SURG SS PI 6.5 STRL IVOR (GLOVE) ×1 IMPLANT
GLOVE SURG SS PI 8.0 STRL IVOR (GLOVE) ×2 IMPLANT
GOWN STRL REUS W/ TWL LRG LVL3 (GOWN DISPOSABLE) ×1 IMPLANT
GOWN STRL REUS W/ TWL XL LVL3 (GOWN DISPOSABLE) ×2 IMPLANT
GOWN STRL REUS W/TWL LRG LVL3 (GOWN DISPOSABLE) ×2
GOWN STRL REUS W/TWL XL LVL3 (GOWN DISPOSABLE) ×4
HANDPIECE INTERPULSE COAX TIP (DISPOSABLE) ×2
HOOD PEEL AWAY FACE SHEILD DIS (HOOD) ×4 IMPLANT
KIT BASIN OR (CUSTOM PROCEDURE TRAY) ×2 IMPLANT
KIT ROOM TURNOVER OR (KITS) ×2 IMPLANT
MANIFOLD NEPTUNE II (INSTRUMENTS) ×2 IMPLANT
NDL SAFETY ECLIPSE 18X1.5 (NEEDLE) IMPLANT
NDL SPNL 18GX3.5 QUINCKE PK (NEEDLE) IMPLANT
NEEDLE 22X1 1/2 (OR ONLY) (NEEDLE) ×2 IMPLANT
NEEDLE HYPO 18GX1.5 SHARP (NEEDLE) ×2
NEEDLE SPNL 18GX3.5 QUINCKE PK (NEEDLE) ×2 IMPLANT
NS IRRIG 1000ML POUR BTL (IV SOLUTION) ×2 IMPLANT
PACK TOTAL JOINT (CUSTOM PROCEDURE TRAY) ×2 IMPLANT
PACK UNIVERSAL I (CUSTOM PROCEDURE TRAY) ×2 IMPLANT
PAD ARMBOARD 7.5X6 YLW CONV (MISCELLANEOUS) ×4 IMPLANT
PADDING CAST COTTON 6X4 STRL (CAST SUPPLIES) ×2 IMPLANT
SET HNDPC FAN SPRY TIP SCT (DISPOSABLE) ×1 IMPLANT
SUCTION FRAZIER TIP 10 FR DISP (SUCTIONS) ×2 IMPLANT
SUT VIC AB 0 CT1 27 (SUTURE) ×2
SUT VIC AB 0 CT1 27XBRD ANBCTR (SUTURE) ×1 IMPLANT
SUT VIC AB 1 CTX 36 (SUTURE) ×2
SUT VIC AB 1 CTX36XBRD ANBCTR (SUTURE) ×1 IMPLANT
SUT VIC AB 2-0 CT1 27 (SUTURE) ×2
SUT VIC AB 2-0 CT1 TAPERPNT 27 (SUTURE) ×1 IMPLANT
SUT VIC AB 3-0 CT1 27 (SUTURE) ×2
SUT VIC AB 3-0 CT1 TAPERPNT 27 (SUTURE) ×1 IMPLANT
SUT VIC AB 3-0 FS2 27 (SUTURE) ×2 IMPLANT
SYR 30ML LL (SYRINGE) ×2 IMPLANT
SYR 50ML LL SCALE MARK (SYRINGE) ×2 IMPLANT
TOWEL OR 17X24 6PK STRL BLUE (TOWEL DISPOSABLE) ×2 IMPLANT
TOWEL OR 17X26 10 PK STRL BLUE (TOWEL DISPOSABLE) ×2 IMPLANT
WATER STERILE IRR 1000ML POUR (IV SOLUTION) ×6 IMPLANT

## 2015-02-17 NOTE — Anesthesia Procedure Notes (Signed)
Spinal Patient location during procedure: OR Start time: 02/17/2015 12:05 PM End time: 02/17/2015 12:15 PM Staffing Anesthesiologist: HODIERNE, ADAM Performed by: anesthesiologist  Preanesthetic Checklist Completed: patient identified, site marked, surgical consent, pre-op evaluation, timeout performed, IV checked, risks and benefits discussed and monitors and equipment checked Spinal Block Patient position: sitting Prep: Betadine and site prepped and draped Patient monitoring: heart rate, cardiac monitor, continuous pulse ox and blood pressure Approach: midline Location: L3-4 Injection technique: single-shot Needle Needle type: Pencan  Needle gauge: 24 G Needle length: 10 cm Assessment Sensory level: T6 Additional Notes Pt tolerated the procedure well.

## 2015-02-17 NOTE — Interval H&P Note (Signed)
History and Physical Interval Note:  02/17/2015 10:55 AM  Carrie Best  has presented today for surgery, with the diagnosis of RIGHT KNEE DEGENERATIVE JOINT DISEASE  The various methods of treatment have been discussed with the patient and family. After consideration of risks, benefits and other options for treatment, the patient has consented to  Procedure(s): TOTAL KNEE ARTHROPLASTY (Right) as a surgical intervention .  The patient's history has been reviewed, patient examined, no change in status, stable for surgery.  I have reviewed the patient's chart and labs.  Questions were answered to the patient's satisfaction.     Nestor Lewandowsky

## 2015-02-17 NOTE — Anesthesia Preprocedure Evaluation (Signed)
Anesthesia Evaluation  Patient identified by MRN, date of birth, ID band Patient awake    Reviewed: Allergy & Precautions, NPO status , Patient's Chart, lab work & pertinent test results  Airway Mallampati: II   Neck ROM: full    Dental   Pulmonary shortness of breath,  breath sounds clear to auscultation        Cardiovascular hypertension, +CHF + dysrhythmias Atrial Fibrillation Rhythm:irregular Rate:Normal  Xarelto was held x3 days.   Neuro/Psych Anxiety Depression CVA    GI/Hepatic GERD-  ,  Endo/Other    Renal/GU      Musculoskeletal  (+) Arthritis -,   Abdominal   Peds  Hematology   Anesthesia Other Findings   Reproductive/Obstetrics                             Anesthesia Physical Anesthesia Plan  ASA: III  Anesthesia Plan: MAC and Spinal   Post-op Pain Management:    Induction: Intravenous  Airway Management Planned: Simple Face Mask  Additional Equipment:   Intra-op Plan:   Post-operative Plan:   Informed Consent: I have reviewed the patients History and Physical, chart, labs and discussed the procedure including the risks, benefits and alternatives for the proposed anesthesia with the patient or authorized representative who has indicated his/her understanding and acceptance.     Plan Discussed with: CRNA, Anesthesiologist and Surgeon  Anesthesia Plan Comments: (Risks, benefits, alternatives to spinal anesthesia was discussed with the patient and her family.  Given her history of some difficulty with her memory, she wishes to avoid a general anesthesia and minimize her risk for post-operative cognitive dysfunction.)        Anesthesia Quick Evaluation

## 2015-02-17 NOTE — Anesthesia Postprocedure Evaluation (Signed)
  Anesthesia Post-op Note  Patient: Carrie Best  Procedure(s) Performed: Procedure(s): TOTAL KNEE ARTHROPLASTY (Right)  Patient Location: PACU  Anesthesia Type:Spinal  Level of Consciousness: awake, alert  and oriented  Airway and Oxygen Therapy: Patient Spontanous Breathing  Post-op Pain: none  Post-op Assessment: Post-op Vital signs reviewed, Patient's Cardiovascular Status Stable and Respiratory Function Stable LLE Motor Response: Purposeful movement, Responds to commands (able to bend knee, wiggles toes) LLE Sensation: Decreased (d/t block) RLE Motor Response: Purposeful movement, Responds to commands (wiggles toes) RLE Sensation: Decreased (d/t block) L Sensory Level: L5-Outer lower leg, top of foot, great toe (able to bend knee, small toes) R Sensory Level: L5-Outer lower leg, top of foot, great toe (small toes)  Post-op Vital Signs: Reviewed and stable  Last Vitals:  Filed Vitals:   02/17/15 1604  BP: 131/56  Pulse: 69  Temp:   Resp: 17    Complications: No apparent anesthesia complications

## 2015-02-17 NOTE — Op Note (Signed)
PATIENT ID:      Carrie Best  MRN:     [id number] DOB/AGE:    1928/05/03 / 81 y.o.       OPERATIVE REPORT    DATE OF PROCEDURE:  02/17/2015       PREOPERATIVE DIAGNOSIS:   RIGHT KNEE DEGENERATIVE JOINT DISEASE      Estimated body mass index is 29.05 kg/(m^2) as calculated from the following:   Height as of this encounter: 5\' 8"  (1.727 m).   Weight as of this encounter: 86.637 kg (191 lb).                                                        POSTOPERATIVE DIAGNOSIS:   RIGHT KNEE DEGENERATIVE JOINT DISEASE                                                                      PROCEDURE:  Procedure(s): TOTAL KNEE ARTHROPLASTY Using DepuyAttune RP implants #5R Femur, #5Tibia, 5 mm Attune RP bearing, 38 Patella     SURGEON: Vannak Montenegro J    ASSISTANT:   Eric K. [employer]   (Present and scrubbed throughout the case, critical for assistance with exposure, retraction, instrumentation, and closure.)         ANESTHESIA: Spinal, Exparel  EBL: 200  FLUID REPLACEMENT: 1500 crystalloid  TOURNIQUET TIME:  Drains: None  Tranexamic Acid: 2 gm topical   COMPLICATIONS:  None         INDICATIONS FOR PROCEDURE: The patient has  RIGHT KNEE DEGENERATIVE JOINT DISEASE, varus deformities, XR shows bone on bone arthritis. Patient has failed all conservative measures including anti-inflammatory medicines, narcotics, attempts at  exercise and weight loss, cortisone injections and viscosupplementation.  Risks and benefits of surgery have been discussed, questions answered.   DESCRIPTION OF PROCEDURE: The patient identified by armband, received  IV antibiotics, in the holding area at [facility]. Patient taken to the operating room, appropriate anesthetic  monitors were attached, and general endotracheal anesthesia induced with  the patient in supine position. Tourniquet  applied high to the operative thigh. Lateral post and foot positioner  applied to the table, the lower extremity was  then prepped and draped  in usual sterile fashion from the ankle to the tourniquet. Time-out procedure was performed. We began the operation, with the knee flexed 100 degrees, by making the anterior midline incision starting at handbreadth above the patella going over the patella 1 cm medial to and 4 cm distal to the tibial tubercle. Small bleeders in the skin and the  subcutaneous tissue identified and cauterized. Transverse retinaculum was incised and reflected medially and a medial parapatellar arthrotomy was accomplished. the patella was everted and theprepatellar fat pad resected. The superficial medial collateral  ligament was then elevated from anterior to posterior along the proximal  flare of the tibia and anterior half of the menisci resected. The knee was hyperflexed exposing bone on bone arthritis. Peripheral and notch osteophytes as well as the cruciate ligaments were then resected. We continued to  work our way around posteriorly  along the proximal tibia, and externally  rotated the tibia subluxing it out from underneath the femur. A McHale  retractor was placed through the notch and a lateral Hohmann retractor  placed, and we then drilled through the proximal tibia in line with the  axis of the tibia followed by an intramedullary guide rod and 2-degree  posterior slope cutting guide. The tibial cutting guide, 3 degree posterior sloped, was pinned into place allowing resection of 8 mm of bone medially and about 2 mm of bone laterally. Satisfied with the tibial resection, we then  entered the distal femur 2 mm anterior to the PCL origin with the  intramedullary guide rod and applied the distal femoral cutting guide  set at 9mm, with 5 degrees of valgus. This was pinned along the  epicondylar axis. At this point, the distal femoral cut was accomplished without difficulty. We then sized for a #5R femoral component and pinned the guide in 3 degrees of external rotation.The chamfer cutting  guide was pinned into place. The anterior, posterior, and chamfer cuts were accomplished without difficulty followed by  the Attune RP box cutting guide and the box cut. We also removed posterior osteophytes from the posterior femoral condyles. At this  time, the knee was brought into full extension. We checked our  extension and flexion gaps and found them symmetric for a 5 mm bearing. Distracting in extension with a lamina spreader, the posterior horns of the menisci were removed, and Exparel, diluted to 60 cc, was injected into the capsule of the knee. The  posterior patella cut was accomplished with the 9.5 mm Attune cutting guide, sized at 38mm dome, and the fixation pegs drilled.The knee  was then once again hyperflexed exposing the proximal tibia. We sized for a #6 tibial base plate, applied the smokestack and the conical reamer followed by the the Delta fin keel punch. We then hammered into place the Attune RP trial femoral component, inserted a  5 mm trial bearing, trial patellar button, and took the knee through range of motion from 0-130 degrees. No thumb pressure was required for patellar  Tracking. At this point, the limb was wrapped with an Esmarch bandage and the tourniquet inflated to 350 mmHg. All trial components were removed, mating surfaces irrigated with pulse lavage, and dried with suction and sponges. A double batch of DePuy HV cement with 1500 mg of Zinacef was mixed and applied to all bony metallic mating surfaces except for the posterior condyles of the femur itself. In order, we  hammered into place the tibial tray and removed excess cement, the femoral component and removed excess cement,  The 5mm  Attune RP bearing  was inserted, and the knee brought to full extension with compression.  The patellar button was clamped into place, and excess cement  removed. While the cement cured the wound was irrigated out with normal saline solution pulse lavage. Ligament stability and  patellar tracking were checked and found to be excellent. The parapatellar arthrotomy was closed with  running #1 Vicryl suture. The subcutaneous tissue with 0 and 2-0 undyed  Vicryl suture, and the skin with running 3-0 SQ vicryl. A dressing of Xeroform,  4 x 4, dressing sponges, Webril, and Ace wrap applied. The patient  awakened, extubated, and taken to recovery room without difficulty.   Gean Birchwood J 02/17/2015, 1:29 PM

## 2015-02-17 NOTE — Progress Notes (Signed)
Orthopedic Tech Progress Note Patient Details:  Carrie Best 12-07-1927 [id number] CPM applied to RLE with appropriate settings. OHF applied to bed.  CPM Right Knee CPM Right Knee: On Right Knee Flexion (Degrees): 40 Right Knee Extension (Degrees): 10   Asia R Thompson 02/17/2015, 3:35 PM

## 2015-02-17 NOTE — Transfer of Care (Signed)
Immediate Anesthesia Transfer of Care Note  Patient: Carrie Best  Procedure(s) Performed: Procedure(s): TOTAL KNEE ARTHROPLASTY (Right)  Patient Location: PACU  Anesthesia Type:MAC and Spinal  Level of Consciousness: awake, alert  and oriented  Airway & Oxygen Therapy: Patient Spontanous Breathing and Patient connected to face mask oxygen  Post-op Assessment: Report given to RN and Post -op Vital signs reviewed and stable  Post vital signs: Reviewed and stable  Last Vitals:  Filed Vitals:   02/17/15 1404  BP:   Pulse:   Temp: 36.4 C    Complications: No apparent anesthesia complications

## 2015-02-18 ENCOUNTER — Encounter (HOSPITAL_COMMUNITY): Payer: Self-pay | Admitting: Orthopedic Surgery

## 2015-02-18 LAB — CBC
HCT: 31.7 % — ABNORMAL LOW (ref 36.0–46.0)
Hemoglobin: 10.4 g/dL — ABNORMAL LOW (ref 12.0–15.0)
MCH: 28.5 pg (ref 26.0–34.0)
MCHC: 32.8 g/dL (ref 30.0–36.0)
MCV: 86.8 fL (ref 78.0–100.0)
PLATELETS: 199 10*3/uL (ref 150–400)
RBC: 3.65 MIL/uL — AB (ref 3.87–5.11)
RDW: 14.7 % (ref 11.5–15.5)
WBC: 11.9 10*3/uL — AB (ref 4.0–10.5)

## 2015-02-18 LAB — BASIC METABOLIC PANEL
Anion gap: 7 (ref 5–15)
BUN: 10 mg/dL (ref 6–20)
CO2: 27 mmol/L (ref 22–32)
Calcium: 8.8 mg/dL — ABNORMAL LOW (ref 8.9–10.3)
Chloride: 99 mmol/L — ABNORMAL LOW (ref 101–111)
Creatinine, Ser: 0.98 mg/dL (ref 0.44–1.00)
GFR calc Af Amer: 59 mL/min — ABNORMAL LOW (ref >60)
GFR, EST NON AFRICAN AMERICAN: 51 mL/min — AB (ref >60)
GLUCOSE: 233 mg/dL — AB (ref 65–99)
POTASSIUM: 3.8 mmol/L (ref 3.5–5.1)
SODIUM: 133 mmol/L — AB (ref 135–145)

## 2015-02-18 MED ORDER — OXYCODONE-ACETAMINOPHEN 5-325 MG PO TABS: 1.0000 | ORAL_TABLET | ORAL | Status: DC | PRN

## 2015-02-18 MED ORDER — RIVAROXABAN 20 MG PO TABS
20.0000 mg | ORAL_TABLET | Freq: Every day | ORAL | Status: DC
  Administered 2015-02-19: 20 mg via ORAL
  Filled 2015-02-18 (×2): qty 1

## 2015-02-18 MED ORDER — ASPIRIN EC 325 MG PO TBEC: 325.0000 mg | DELAYED_RELEASE_TABLET | Freq: Two times a day (BID) | ORAL | Status: DC

## 2015-02-18 MED ORDER — TIZANIDINE HCL 2 MG PO CAPS: 2.0000 mg | ORAL_CAPSULE | Freq: Three times a day (TID) | ORAL | Status: DC | PRN

## 2015-02-18 NOTE — Evaluation (Signed)
Physical Therapy Evaluation Patient Details Name: Carrie Best MRN: [id number] DOB: 29-Nov-1927 Today's Date: 02/18/2015   History of Present Illness  Patient is a 81 y/o female s/p R TKA. PMH includes HTN, CHF, depression, CVA and anxiety.  Clinical Impression  Mobility assessment limited due to pt having difficulty staying awake during evaluation. Requires constant verbal and tactile stimulus to keep eyes opened otherwise falls back asleep even when sitting EOB. Not able to attempt standing or transfers OOB due to above, safety concerns and lack of voluntary movement/initiation to perform tasks. Per granddaughter, pt plans to go home with assist from family, however will need to assess mobility and safety while in hospital. If mobility does not improve, may require ST SNF. Will follow up in PM to perform mobility assessment as tolerated.     Follow Up Recommendations Home health PT;Supervision/Assistance - 24 hour    Equipment Recommendations  None recommended by PT    Recommendations for Other Services       Precautions / Restrictions Precautions Precautions: Fall;Knee Precaution Booklet Issued: Yes (comment) Precaution Comments: Reviewed no pillow under knee. Restrictions Weight Bearing Restrictions: Yes RLE Weight Bearing: Weight bearing as tolerated      Mobility  Bed Mobility Overal bed mobility: Needs Assistance Bed Mobility: Supine to Sit;Sit to Supine     Supine to sit: Mod assist Sit to supine: Max assist;+2 for physical assistance   General bed mobility comments: Assist to bring BLEs to EOB and elevate trunk - poor attention, continually closing eyes and falling asleep. Max A of 2 to return to supine as pt not able to actively follow commands to perform task (2/2 to lethargy/poor arousal).  Transfers Overall transfer level:  (Pt not able to stay awake to participate in transfer. Continually stating, "okay, lets go" but making no initiation of movement. "Don;t  let me fall.")                  Ambulation/Gait                Stairs            Wheelchair Mobility    Modified Rankin (Stroke Patients Only)       Balance Overall balance assessment: History of Falls                                           Pertinent Vitals/Pain Pain Assessment: Faces Faces Pain Scale: Hurts even more Pain Location: right knee Pain Descriptors / Indicators: Grimacing;Guarding Pain Intervention(s): Limited activity within patient's tolerance;Monitored during session;Repositioned;Premedicated before session    Home Living Family/patient expects to be discharged to:: Private residence Living Arrangements: Children;Other relatives Available Help at Discharge: Family;Available 24 hours/day Type of Home: House Home Access: Ramped entrance     Home Layout: One level Home Equipment: Bedside commode;Shower seat;Walker - 2 wheels;Wheelchair - manual;Cane - single point Additional Comments: Information above pulled from prior hospital admission ~1 month ago as pt too lethargic to answer questions.    Prior Function Level of Independence: Needs assistance   Gait / Transfers Assistance Needed: Pt reports using RW PTA. Per prior note, grandson reports she requires supervision durnig ambulation.   ADL's / Homemaking Assistance Needed: Per prior note, requires assist for ADLs.  Comments: Pt reports multiple falls. "Don't let me fall, I fall a lot."     Hand Dominance  Dominant Hand: Right    Extremity/Trunk Assessment   Upper Extremity Assessment: Defer to OT evaluation           Lower Extremity Assessment: RLE deficits/detail;Difficult to assess due to impaired cognition RLE Deficits / Details: Limited AROM throughout however unable to formally assess due to decreased level of arousal and poor attention secondary to pain meds.        Communication   Communication: HOH  Cognition Arousal/Alertness:  Lethargic;Suspect due to medications Behavior During Therapy: [facility] for tasks assessed/performed Overall Cognitive Status: Difficult to assess                      General Comments      Exercises        Assessment/Plan    PT Assessment Patient needs continued PT services  PT Diagnosis Acute pain;Generalized weakness   PT Problem List Decreased strength;Pain;Decreased range of motion;Decreased cognition;Decreased mobility;Decreased activity tolerance  PT Treatment Interventions Balance training;Gait training;Functional mobility training;Therapeutic activities;Therapeutic exercise;Patient/family education   PT Goals (Current goals can be found in the Care Plan section) Acute Rehab PT Goals Patient Stated Goal: pt unable to state goal due to decreased level of arousal. PT Goal Formulation: Patient unable to participate in goal setting Time For Goal Achievement: 03/04/15 Potential to Achieve Goals: Fair    Frequency 7X/week   Barriers to discharge        Co-evaluation               End of Session Equipment Utilized During Treatment: Gait belt Activity Tolerance: Patient limited by lethargy;Patient limited by pain Patient left: in bed;with call bell/phone within reach;with bed alarm set Nurse Communication: Mobility status         Time: 0940-1004 PT Time Calculation (min) (ACUTE ONLY): 24 min   Charges:   PT Evaluation $Initial PT Evaluation Tier I: 1 Procedure PT Treatments $Therapeutic Activity: 8-22 mins   PT G Codes:        Laure Leone A Samaya Boardley 02/18/2015, 10:24 AM  Mylo Red, PT, DPT [phone]

## 2015-02-18 NOTE — Progress Notes (Signed)
Patient ID: CLELLA MCCOSH, female   DOB: [birth year], 81 y.o.   MRN: [id number] PATIENT ID: LAURAANNE EYMARD  MRN: [id number]  DOB/AGE:  [birth year] / 81 y.o.  1 Day Post-Op Procedure(s) (LRB): TOTAL KNEE ARTHROPLASTY (Right)    PROGRESS NOTE Subjective: Patient is alert, oriented, no Nausea, no Vomiting, yes passing gas, no Bowel Movement. Taking PO well. Denies SOB, Chest or Calf Pain. Using Incentive Spirometer, PAS in place. Ambulate WBAT today, CPM 0-40 Patient reports pain as 2 on 0-10 scale  .    Objective: Vital signs in last 24 hours: Filed Vitals:   02/17/15 1604 02/17/15 1619 02/17/15 1622 02/17/15 2123  BP: 131/56 150/77  131/57  Pulse: 69 64  83  Temp:   97.8 F (36.6 C) 97.7 F (36.5 C)  TempSrc:    Oral  Resp: 17 19    Height:      Weight:      SpO2: 100% 100%  99%      Intake/Output from previous day: I/O last 3 completed shifts: In: 600 [I.V.:600] Out: 50 [Blood:50]   Intake/Output this shift: Total I/O In: 10.4 [I.V.:10.4] Out: 2200 [Urine:2200]   LABORATORY DATA:  Recent Labs  02/17/15 1110 02/17/15 1520  INR 3.27* 2.85*    Examination: Neurologically intact ABD soft Neurovascular intact Sensation intact distally Intact pulses distally Dorsiflexion/Plantar flexion intact Incision: dressing C/D/I No cellulitis present Compartment soft}  Assessment:   1 Day Post-Op Procedure(s) (LRB): TOTAL KNEE ARTHROPLASTY (Right) ADDITIONAL DIAGNOSIS: Expected Acute Blood Loss Anemia, HTN, CHF, Afib  Plan: PT/OT WBAT, CPM 5/hrs day until ROM 0-90 degrees, then D/C CPM DVT Prophylaxis:  SCDx72hrs, ASA 325 mg BID x 2 weeks DISCHARGE PLAN: Home, 1-2 days DISCHARGE NEEDS: HHPT, CPM, Walker and 3-in-1 comode seat     Lander Eslick J 02/18/2015, 6:04 AM

## 2015-02-18 NOTE — Progress Notes (Signed)
Physical Therapy Treatment Patient Details Name: Carrie Best MRN: [id number] DOB: 21-Jul-1928 Today's Date: 02/18/2015    History of Present Illness Patient is a 81 y/o female s/p R TKA. PMH includes HTN, CHF, depression, CVA and anxiety.    PT Comments    Patient progressing very slowly with mobility. Very anxious and fearful of falling. Attempted SPT with assist of 2 however pt not able to perform- resisting therapists and not following commands for safety. Continues to be lethargic from ?pain medication with difficulty sustaining attention and staying aroused to participate in session. Pt with difficulty following instructions to participate in there ex. Discussed decreasing dose of pain meds to hopefully improve mentation and mobility tomorrow. Discharge recommended updated to ST SNF as family not able to care for pt at home at this point. Will continue to follow to maximize independence and improve mobility while in hospital.   Follow Up Recommendations  SNF;Supervision/Assistance - 24 hour     Equipment Recommendations  None recommended by PT    Recommendations for Other Services OT consult     Precautions / Restrictions Precautions Precautions: Fall;Knee Precaution Booklet Issued: Yes (comment) Precaution Comments: Reviewed no pillow under knee. Restrictions Weight Bearing Restrictions: Yes RLE Weight Bearing: Weight bearing as tolerated    Mobility  Bed Mobility Overal bed mobility: Needs Assistance Bed Mobility: Supine to Sit     Supine to sit: Mod assist;HOB elevated Sit to supine: Max assist;+2 for physical assistance   General bed mobility comments: Assist to bring BLEs to EOB and elevate trunk - poor attention, continually closing eyes and falling asleep. Max A of 2 to return to supine. Lethargic.   Transfers Overall transfer level: Needs assistance Equipment used: Rolling walker (2 wheeled) Transfers: Sit to/from [employer] Sit to  Stand: Max assist;+2 physical assistance Stand pivot transfers: Total assist;+2 physical assistance       General transfer comment: Max A of 2 to boost up to standing with manual cues for hip extension and verbal cues for upright posture. Stood from [employer]. VC's for hand placement and technique. increased time. Attempted SPT to [facility] however pt with death grip on commode handle and not following commands to safely participate in transfer- high anxiety, "dont let me fall, do you got me?"  Ambulation/Gait                 Stairs            Wheelchair Mobility    Modified Rankin (Stroke Patients Only)       Balance Overall balance assessment: Needs assistance;History of Falls Sitting-balance support: Feet supported;No upper extremity supported Sitting balance-Leahy Scale: Fair Sitting balance - Comments: Forward lean intermittently wtih eyes closing requiring close min guard for safety.    Standing balance support: During functional activity;Bilateral upper extremity supported Standing balance-Leahy Scale: Poor Standing balance comment: Relient on RW and external support for balance.                     Cognition Arousal/Alertness: Lethargic;Suspect due to medications Behavior During Therapy: [facility] for tasks assessed/performed;Anxious Overall Cognitive Status: Impaired/Different from baseline Area of Impairment: Attention;Problem solving;Awareness   Current Attention Level: Sustained Memory: Decreased recall of precautions       Problem Solving: Slow processing;Decreased initiation;Difficulty sequencing;Requires verbal cues;Requires tactile cues      Exercises      General Comments General comments (skin integrity, edema, etc.): Pt's family present in room. Lengthy discussion with grandson  and grandaughter about disposition. Discussed ST SNF.       Pertinent Vitals/Pain Pain Assessment: Faces Faces Pain Scale: Hurts worst Pain Location: right knee with  mobility Pain Descriptors / Indicators: Grimacing;Guarding;Moaning Pain Intervention(s): Limited activity within patient's tolerance;Monitored during session;Repositioned;Premedicated before session    Home Living                      Prior Function            PT Goals (current goals can now be found in the care plan section) Progress towards PT goals: Progressing toward goals    Frequency  7X/week    PT Plan Discharge plan needs to be updated    Co-evaluation             End of Session Equipment Utilized During Treatment: Gait belt Activity Tolerance: Patient limited by pain;Patient limited by lethargy Patient left: in bed;with call bell/phone within reach;with bed alarm set;in CPM;with family/visitor present     Time: 1791-5056 PT Time Calculation (min) (ACUTE ONLY): 35 min  Charges:  $Therapeutic Activity: 23-37 mins                    G Codes:      Acadia Thammavong A Karyme Mcconathy 02/18/2015, 5:07 PM Mylo Red, PT, DPT [phone]

## 2015-02-18 NOTE — Progress Notes (Signed)
Orthopedic Tech Progress Note Patient Details:  Carrie Best September 04, [id number] Off cpm at 7:00 pm Patient ID: Carrie Best, female   DOB: 1928/07/13, 81 y.o.   MRN: [id number]   [name] 02/18/2015, 7:00 PM

## 2015-02-18 NOTE — Care Management (Signed)
Utilization review completed by Emet Rafanan N. Amiley Shishido, RN BSN 

## 2015-02-19 LAB — CBC
HEMATOCRIT: 28 % — AB (ref 36.0–46.0)
Hemoglobin: 9.4 g/dL — ABNORMAL LOW (ref 12.0–15.0)
MCH: 28.8 pg (ref 26.0–34.0)
MCHC: 33.6 g/dL (ref 30.0–36.0)
MCV: 85.9 fL (ref 78.0–100.0)
Platelets: 187 10*3/uL (ref 150–400)
RBC: 3.26 MIL/uL — ABNORMAL LOW (ref 3.87–5.11)
RDW: 14.5 % (ref 11.5–15.5)
WBC: 10.8 10*3/uL — AB (ref 4.0–10.5)

## 2015-02-19 NOTE — Progress Notes (Signed)
Patient ID: Carrie Best, female   DOB: [birth year], 81 y.o.   MRN: [id number] PATIENT ID: Carrie Best  MRN: [id number]  DOB/AGE:  1928-05-01 / 81 y.o.  2 Days Post-Op Procedure(s) (LRB): TOTAL KNEE ARTHROPLASTY (Right)    PROGRESS NOTE Subjective: Patient is alert, oriented, no Nausea, no Vomiting, yes passing gas, no Bowel Movement. Taking PO well. Denies SOB, Chest or Calf Pain. Using Incentive Spirometer, PAS in place. Ambulate: Has pivoted to chair, significant balance issues as ambulated minimally this is chronic, CPM 0-40 Patient reports pain as 3 on 0-10 scale  .    Objective: Vital signs in last 24 hours: Filed Vitals:   02/18/15 0626 02/18/15 1339 02/18/15 2031 02/19/15 0540  BP: 137/65 116/63 140/55 143/59  Pulse: 68 73 74 65  Temp: 97.8 F (36.6 C) 97.1 F (36.2 C) 98.9 F (37.2 C) 98.8 F (37.1 C)  TempSrc:  Oral  Oral  Resp:  18 18 18   Height:      Weight:      SpO2: 100% 94% 92% 90%      Intake/Output from previous day: I/O last 3 completed shifts: In: 2745.4 [P.O.:860; I.V.:1885.4] Out: 2750 [Urine:2750]   Intake/Output this shift:     LABORATORY DATA:  Recent Labs  02/17/15 1110 02/17/15 1520 02/18/15 0520 02/19/15 0550  WBC  --   --  11.9* 10.8*  HGB  --   --  10.4* 9.4*  HCT  --   --  31.7* 28.0*  PLT  --   --  199 187  NA  --   --  133*  --   K  --   --  3.8  --   CL  --   --  99*  --   CO2  --   --  27  --   BUN  --   --  10  --   CREATININE  --   --  0.98  --   GLUCOSE  --   --  233*  --   INR 3.27* 2.85*  --   --   CALCIUM  --   --  8.8*  --     Examination: Neurologically intact ABD soft Neurovascular intact Sensation intact distally Intact pulses distally Dorsiflexion/Plantar flexion intact Incision: dressing C/D/I No cellulitis present Compartment soft}  Assessment:   2 Days Post-Op Procedure(s) (LRB): TOTAL KNEE ARTHROPLASTY (Right) ADDITIONAL DIAGNOSIS: Expected Acute Blood Loss Anemia, Hypertension, stable  CHF, generalized weakness and balance issues that are chronic and will require stay in rehabilitation skilled nursing  Plan: PT/OT WBAT, CPM 5/hrs day until ROM 0-90 degrees, then D/C CPM DVT Prophylaxis:  SCDx72hrs, ASA 325 mg BID x 2 weeks DISCHARGE PLAN: Skilled Nursing Facility/Rehab DISCHARGE NEEDS: HHPT, CPM, Walker and 3-in-1 comode seat     Newman Waren J 02/19/2015, 7:14 AM

## 2015-02-19 NOTE — Clinical Social Work Note (Signed)
Clinical Social Work Assessment  Patient Details  Name: Carrie Best MRN: [id number] Date of Birth: 06-14-1928  Date of referral:  02/19/15               Reason for consult:  Facility Placement                Permission sought to share information with:  Magazine features editor (AMS) Permission granted to share information::  No (AMS)  Name::        Agency::   ([facility] as a back up to first choice [facility])  Relationship::     Contact Information:     Housing/Transportation Living arrangements for the past 2 months:  Single Family Home Source of Information:  Other (Comment Required) (daughter-in-law Carrie Best) Patient Interpreter Needed:  None Criminal Activity/Legal Involvement Pertinent to Current Situation/Hospitalization:  No - Comment as needed Significant Relationships:  Adult Children, Other(Comment) (grandson Carrie Best) Lives with:  Other (Comment) (grandson Carrie Best) Do you feel safe going back to the place where you live?  No (fall risk) Need for family participation in patient care:  Yes (Comment) (AMS)  Care giving concerns:  Patient at risk for falls cannot be discharged home.   Social Worker assessment / plan:  CSW received consult for SNF placement at time of discharge. Assessment completed with daughter-in-law, Carrie Best who is very active in patient's care.  Carrie Best states that the patient lives with her son (patient's grandson), Carrie Best, who also shares responsibility for after care.  Family has chosen [facility] at time of discharge.  CSW will send appropriate referral and facilitate.  Employment status:  Retired Database administrator PT Recommendations:  Skilled Nursing Facility Information / Referral to community resources:  Skilled Nursing Facility  Patient/Family's Response to care:  Family is very Adult nurse of CSW involvement and assistance.  Patient/Family's Understanding of and Emotional Response to Diagnosis,  Current Treatment, and Prognosis:  Patient's family is very realistic regarding patient's needs.  Emotional Assessment Appearance:    Attitude/Demeanor/Rapport:   (AMS completed with family) Affect (typically observed):   (AMS- completed with family) Orientation:  Oriented to Self Alcohol / Substance use:  Not Applicable Psych involvement (Current and /or in the community):  No (Comment)  Discharge Needs  Concerns to be addressed:  No discharge needs identified Readmission within the last 30 days:    Current discharge risk:  None Barriers to Discharge:  No Barriers Identified   Rondel Baton, LCSW 02/19/2015, 1:01 PM

## 2015-02-19 NOTE — Progress Notes (Signed)
Physical Therapy Treatment Patient Details Name: DEAUNA YAW MRN: [id number] DOB: Jun 06, 1928 Today's Date: 02/19/2015    History of Present Illness Patient is a 81 y/o female s/p R TKA. PMH includes HTN, CHF, depression, CVA and anxiety.    PT Comments    Pt continues to be anxious during mobility and needs firm step-by-step cueing for safety and technique.  Pt able to get up to a chair today with Max - TotalA x2.  Pt will need SNF level of care at D/C.    Follow Up Recommendations  SNF     Equipment Recommendations  None recommended by PT    Recommendations for Other Services       Precautions / Restrictions Precautions Precautions: Fall Precaution Comments: reviewed with family present Restrictions Weight Bearing Restrictions: Yes RLE Weight Bearing: Weight bearing as tolerated    Mobility  Bed Mobility Overal bed mobility: Needs Assistance;+2 for physical assistance Bed Mobility: Supine to Sit     Supine to sit: Max assist;+2 for physical assistance;HOB elevated     General bed mobility comments: A to power up trunk and to advance LEs. Cues provided for technique.  Transfers Overall transfer level: Needs assistance Equipment used: 2 person hand held assist Transfers: Sit to/from [employer] Sit to Stand: Max assist;+2 physical assistance Stand pivot transfers: Total assist;+2 physical assistance       General transfer comment: Max +2 to powerup to standing and cues for upright posture. Pt very fearful/anxious during transfer. Transfered from EOB to recliner with +2 HHA, encouragement, sequencing, and cueing provided. Also, partial sit<>stand from recliner x2 with max A.   Utilized pad under hips to A with more erect posture and to complete pivot to recliner.    Ambulation/Gait                 Stairs            Wheelchair Mobility    Modified Rankin (Stroke Patients Only)       Balance Overall balance assessment:  Needs assistance Sitting-balance support: Single extremity supported;Feet supported Sitting balance-Leahy Scale: Poor Sitting balance - Comments: Sat EOB to clean face and drink with single extremity support.    Standing balance support: During functional activity Standing balance-Leahy Scale: Zero Standing balance comment: Cues for upright posture with pt leaning onto therpist shoulder. RW and +2 asssit for static standing balance                    Cognition Arousal/Alertness: Awake/alert Behavior During Therapy: Anxious Overall Cognitive Status: Impaired/Different from baseline Area of Impairment: Attention;Memory;Following commands;Safety/judgement;Awareness;Problem solving   Current Attention Level: Sustained Memory: Decreased short-term memory;Decreased recall of precautions Following Commands: Follows one step commands with increased time;Follows multi-step commands inconsistently Safety/Judgement: Decreased awareness of safety;Decreased awareness of deficits Awareness: Emergent Problem Solving: Slow processing;Difficulty sequencing;Requires verbal cues;Requires tactile cues General Comments: pt has difficulty remaining on task and often needs cues for attention and safety.      Exercises Total Joint Exercises Ankle Circles/Pumps: AAROM;Both;10 reps Long Arc Quad: AAROM;Right;10 reps Knee Flexion: AROM;Right;10 reps Goniometric ROM: ~15 - 40    General Comments General comments (skin integrity, edema, etc.): Grandaughter and grandson present during session. Provided basic ADL education and home setup safety to pt and family.       Pertinent Vitals/Pain Pain Assessment: Faces Faces Pain Scale: Hurts whole lot Pain Location: R knee with mobility Pain Descriptors / Indicators: Grimacing;Guarding Pain Intervention(s): Monitored during session;Premedicated before  session;Repositioned    Home Living Family/patient expects to be discharged to:: Skilled nursing  facility Living Arrangements: Children;Other relatives Available Help at Discharge: Family;Available 24 hours/day Type of Home: House Home Access: Ramped entrance   Home Layout: One level Home Equipment: Bedside commode;Shower seat;Walker - 2 wheels;Wheelchair - manual;Cane - single point      Prior Function Level of Independence: Needs assistance  Gait / Transfers Assistance Needed: Pt reports using RW PTA. Per prior note, grandson reports she requires supervision durnig ambulation.  ADL's / Homemaking Assistance Needed: Per granddaughter's report family provides supervision during bathing/dressing and shower transfers.     PT Goals (current goals can now be found in the care plan section) Acute Rehab PT Goals Patient Stated Goal: pt unable to state when asked.   PT Goal Formulation: Patient unable to participate in goal setting Time For Goal Achievement: 03/04/15 Potential to Achieve Goals: Fair Progress towards PT goals: Progressing toward goals    Frequency  7X/week    PT Plan Current plan remains appropriate    Co-evaluation PT/OT/SLP Co-Evaluation/Treatment: Yes Reason for Co-Treatment: For patient/therapist safety PT goals addressed during session: Mobility/safety with mobility;Balance OT goals addressed during session: ADL's and self-care     End of Session Equipment Utilized During Treatment: Gait belt Activity Tolerance: Patient limited by pain;Patient limited by fatigue Patient left: in chair;with call bell/phone within reach;with family/visitor present     Time: 3716-9678 PT Time Calculation (min) (ACUTE ONLY): 36 min  Charges:  $Therapeutic Activity: 8-22 mins                    G CodesSunny Schlein, [place] [phone] 02/19/2015, 3:02 PM

## 2015-02-19 NOTE — Clinical Social Work Placement (Signed)
   CLINICAL SOCIAL WORK PLACEMENT  NOTE  Date:  02/19/2015  Patient Details  Name: Carrie Best MRN: [id number] Date of Birth: 07-01-1928  Clinical Social Work is seeking post-discharge placement for this patient at the Skilled  Nursing Facility level of care (*CSW will initial, date and re-position this form in  chart as items are completed):  Yes   Patient/family provided with [facility] Clinical Social Work Department's list of facilities offering this level of care within the geographic area requested by the patient (or if unable, by the patient's family).  Yes   Patient/family informed of their freedom to choose among providers that offer the needed level of care, that participate in Medicare, Medicaid or managed care program needed by the patient, have an available bed and are willing to accept the patient.  Yes   Patient/family informed of [facility]'s ownership interest in [facility] and [facility], as well as of the fact that they are under no obligation to receive care at these facilities.  PASRR submitted to EDS on 02/19/15     PASRR number received on 02/19/15     Existing PASRR number confirmed on       FL2 transmitted to all facilities in geographic area requested by pt/family on 02/19/15     FL2 transmitted to all facilities within larger geographic area on       Patient informed that his/her managed care company has contracts with or will negotiate with certain facilities, including the following:   (list given)     Yes   Patient/family informed of bed offers received.  Patient chooses bed at [facility]     Physician recommends and patient chooses bed at  (none)    Patient to be transferred to   on  .  Patient to be transferred to facility by       Patient family notified on   of transfer.  Name of family member notified:        PHYSICIAN Please sign FL2     Additional Comment:     _______________________________________________ Rondel Baton, LCSW 02/19/2015, 1:03 PM

## 2015-02-19 NOTE — Care Management Note (Signed)
Case Management Note  Patient Details  Name: Carrie Best MRN: [id number] Date of Birth: September 11, 1927  Subjective/Objective:          S/p right knee total arthroplasty           Action/Plan:   Expected Discharge Date:                  Expected Discharge Plan:  Skilled Nursing Facility  In-House Referral:  Clinical Social Work  Discharge planning Services  CM Consult  Post Acute Care Choice:  Durable Medical Equipment, Home Health Choice offered to:  Patient  DME Arranged:  CPM, Walker rolling, 3-N-1 DME Agency:  [employer]  HH Arranged:  PT HH Agency:  [facility]  Status of Service:  In process, will continue to follow  Medicare Important Message Given:    Date Medicare IM Given:    Medicare IM give by:    Date Additional Medicare IM Given:    Additional Medicare Important Message give by:     If discussed at Long Length of Stay Meetings, dates discussed:    Additional Comments:  PT/OT recommending SNF, referral made to CSW.  Monica Becton, RN 02/19/2015, 12:24 PM

## 2015-02-19 NOTE — Progress Notes (Signed)
Occupational Therapy Evaluation Patient Details Name: Carrie Best MRN: [id number] DOB: [birth year] Today's Date: 02/19/2015    History of Present Illness Patient is a 81 y/o female s/p R TKA. PMH includes HTN, CHF, depression, CVA and anxiety.   Clinical Impression   Pt admitted with the above diagnoses and presents with below problem list. Pt will benefit from continued OT to address the below listed deficits and maximize independence with BADLs prior to d/c to venue below. PTA pt was supervision level for LB ADLs and shower transfers. Pt is currently max-total assist with LB ADLs and transfers. Pt with high anxiety/fear during transfers. ADLs completed and education provided to pt and family as detailed below. OT to continue to follow acutely.      Follow Up Recommendations  SNF    Equipment Recommendations  Other (comment) (defer to next venue)    Recommendations for Other Services       Precautions / Restrictions Precautions Precautions: Fall;Knee Precaution Comments: reviewed with family present Restrictions Weight Bearing Restrictions: Yes RLE Weight Bearing: Weight bearing as tolerated      Mobility Bed Mobility Overal bed mobility: Needs Assistance Bed Mobility: Supine to Sit     Supine to sit: HOB elevated;Max assist     General bed mobility comments: A to power up trunk and to advance LEs. Cues provided for technique.  Transfers Overall transfer level: Needs assistance Equipment used: 2 person hand held assist Transfers: Sit to/from [employer] Sit to Stand: Max assist;+2 physical assistance Stand pivot transfers: Total assist;+2 physical assistance       General transfer comment: Max +2 to powerup to standing and cues for upright posture. Pt very fearful/anxious during transfer. Transfered from EOB to recliner with +2 HHA, encouragement, sequencing, and cueing provided. Also, partial sit<>stand from recliner x2 with max A.      Balance Overall balance assessment: Needs assistance;History of Falls Sitting-balance support: Feet supported;Single extremity supported Sitting balance-Leahy Scale: Fair Sitting balance - Comments: Sat EOB to clean face and drink with single extremity support.    Standing balance support: Bilateral upper extremity supported;During functional activity Standing balance-Leahy Scale: Poor Standing balance comment: Cues for upright posture with pt leaning onto therpist shoulder. RW and +2 asssit for static standing balance                            ADL Overall ADL's : Needs assistance/impaired Eating/Feeding: Set up;Sitting   Grooming: Set up;Sitting   Upper Body Bathing: Sitting;Minimal assitance Upper Body Bathing Details (indicate cue type and reason): due to decreased sitting balance Lower Body Bathing: +2 for physical assistance;Maximal assistance;Sit to/from stand   Upper Body Dressing : Minimal assistance;Sitting Upper Body Dressing Details (indicate cue type and reason): due to decreased sitting balance Lower Body Dressing: Maximal assistance;+2 for physical assistance;Sit to/from stand   Toilet Transfer: Total assistance;+2 for physical assistance;Stand-pivot;BSC;RW   Toileting- Clothing Manipulation and Hygiene: Maximal assistance;+2 for physical assistance;Sit to/from stand   Tub/ Engineer, structural: Total assistance;+2 for physical assistance;Stand-pivot;3 in 1;Rolling walker   Functional mobility during ADLs:  (not attempted) General ADL Comments: Pt very fearful during transfers with max encouragement provided. Pt +2 max A for LB ADLs in sit<>stand, +2 total A for SPT transfers. Pt with decreased cognition with some perseveration noted. Unclear how much of this is baseline.      Vision     Perception     Praxis  Pertinent Vitals/Pain Pain Assessment: Faces Faces Pain Scale: Hurts worst Pain Location: rt knee with movement Pain Descriptors /  Indicators: Grimacing;Operative site guarding;Moaning Pain Intervention(s): Limited activity within patient's tolerance;Monitored during session;Repositioned;Utilized relaxation techniques     Hand Dominance Right   Extremity/Trunk Assessment Upper Extremity Assessment Upper Extremity Assessment: Generalized weakness   Lower Extremity Assessment Lower Extremity Assessment: Defer to PT evaluation       Communication Communication Communication: HOH   Cognition Arousal/Alertness: Awake/alert Behavior During Therapy: Anxious;WFL for tasks assessed/performed Overall Cognitive Status: Impaired/Different from baseline Area of Impairment: Safety/judgement;Problem solving;Memory;Following commands     Memory: Decreased short-term memory;Decreased recall of precautions Following Commands: Follows multi-step commands inconsistently Safety/Judgement: Decreased awareness of safety   Problem Solving: Slow processing;Difficulty sequencing;Requires verbal cues;Requires tactile cues General Comments: A&O x 4. Some perseveration noted. Easily distrated with some short-term memory and problem-solving deficits noted.    General Comments       Exercises       Shoulder Instructions      Home Living Family/patient expects to be discharged to:: Skilled nursing facility Living Arrangements: Children;Other relatives Available Help at Discharge: Family;Available 24 hours/day Type of Home: House Home Access: Ramped entrance     Home Layout: One level     Bathroom Shower/Tub: Walk-in shower         Home Equipment: Bedside commode;Shower seat;Walker - 2 wheels;Wheelchair - manual;Cane - single point          Prior Functioning/Environment Level of Independence: Needs assistance  Gait / Transfers Assistance Needed: Pt reports using RW PTA. Per prior note, grandson reports she requires supervision durnig ambulation.  ADL's / Homemaking Assistance Needed: Per granddaughter's report  family provides supervision during bathing/dressing and shower transfers.        OT Diagnosis: Acute pain   OT Problem List: Impaired balance (sitting and/or standing);Decreased knowledge of use of DME or AE;Decreased knowledge of precautions;Pain;Decreased activity tolerance;Decreased cognition;Decreased safety awareness   OT Treatment/Interventions: Self-care/ADL training;Energy conservation;DME and/or AE instruction;Therapeutic activities;Patient/family education;Balance training    OT Goals(Current goals can be found in the care plan section) Acute Rehab OT Goals OT Goal Formulation: With patient/family Time For Goal Achievement: 02/26/15 Potential to Achieve Goals: Good ADL Goals Pt Will Perform Lower Body Dressing: with min assist;sit to/from stand;with adaptive equipment (+2) Pt Will Transfer to Toilet: with +2 assist;with mod assist;stand pivot transfer;bedside commode Pt Will Perform Toileting - Clothing Manipulation and hygiene: with mod assist;sit to/from stand (+2) Additional ADL Goal #1: Pt will complete supine<>EOB at mod A level to prepare for OOB ADLs.  OT Frequency: Min 2X/week   Barriers to D/C: Decreased caregiver support  Pt needing +2 max-total A.       Co-evaluation PT/OT/SLP Co-Evaluation/Treatment: Yes Reason for Co-Treatment: Necessary to address cognition/behavior during functional activity;For patient/therapist safety   OT goals addressed during session: ADL's and self-care      End of Session Equipment Utilized During Treatment: Gait belt CPM Right Knee Additional Comments: towel under ankle Nurse Communication: Other (comment) (nurse tech present for part of session)  Activity Tolerance: Other (comment);Patient limited by pain (anxiety) Patient left: in chair;with call bell/phone within reach;with family/visitor present   Time: 1100-1138 OT Time Calculation (min): 38 min Charges:  OT General Charges $OT Visit: 1 Procedure OT  Evaluation $Initial OT Evaluation Tier I: 1 Procedure G-Codes:    Pilar Grammes 03/18/2015, 2:07 PM

## 2015-02-19 NOTE — Plan of Care (Signed)
Problem: Consults Goal: Diagnosis- Total Joint Replacement Primary Total Knee Right     

## 2015-02-20 LAB — CBC
HEMATOCRIT: 27.1 % — AB (ref 36.0–46.0)
HEMOGLOBIN: 9.2 g/dL — AB (ref 12.0–15.0)
MCH: 28.7 pg (ref 26.0–34.0)
MCHC: 33.9 g/dL (ref 30.0–36.0)
MCV: 84.4 fL (ref 78.0–100.0)
Platelets: 172 10*3/uL (ref 150–400)
RBC: 3.21 MIL/uL — AB (ref 3.87–5.11)
RDW: 14.3 % (ref 11.5–15.5)
WBC: 9 10*3/uL (ref 4.0–10.5)

## 2015-02-20 MED ORDER — RIVAROXABAN 10 MG PO TABS: 10.0000 mg | ORAL_TABLET | Freq: Every day | ORAL | Status: DC

## 2015-02-20 MED ORDER — RIVAROXABAN 10 MG PO TABS
10.0000 mg | ORAL_TABLET | Freq: Every day | ORAL | Status: DC
  Administered 2015-02-20: 10 mg via ORAL
  Filled 2015-02-20: qty 1

## 2015-02-20 NOTE — Discharge Planning (Signed)
Patient to be discharged to [facility]. Patient's daughter-in-law updated at bedside.  Facility: [facility] RN report number: [phone] Transportation: EMS ([address]  Marcelline Deist, [place] - [phone] Clinical Social Work Department Orthopedics [phone]) and Surgical [phone])

## 2015-02-20 NOTE — Clinical Social Work Placement (Signed)
   CLINICAL SOCIAL WORK PLACEMENT  NOTE  Date:  02/20/2015  Patient Details  Name: Carrie Best MRN: [id number] Date of Birth: September 09, 1927  Clinical Social Work is seeking post-discharge placement for this patient at the Skilled  Nursing Facility level of care (*CSW will initial, date and re-position this form in  chart as items are completed):  Yes   Patient/family provided with [place] Clinical Social Work Department's list of facilities offering this level of care within the geographic area requested by the patient (or if unable, by the patient's family).  Yes   Patient/family informed of their freedom to choose among providers that offer the needed level of care, that participate in Medicare, Medicaid or managed care program needed by the patient, have an available bed and are willing to accept the patient.  Yes   Patient/family informed of [place]'s ownership interest in [facility] and [facility], as well as of the fact that they are under no obligation to receive care at these facilities.  PASRR submitted to EDS on 02/19/15     PASRR number received on 02/19/15     Existing PASRR number confirmed on       FL2 transmitted to all facilities in geographic area requested by pt/family on 02/19/15     FL2 transmitted to all facilities within larger geographic area on       Patient informed that his/her managed care company has contracts with or will negotiate with certain facilities, including the following:   (list given)     Yes   Patient/family informed of bed offers received.  Patient chooses bed at [facility]     Physician recommends and patient chooses bed at  (none)    Patient to be transferred to [facility] on 02/20/15.  Patient to be transferred to facility by [facility]     Patient family notified on 02/20/15 of transfer.  Name of family member notified:  Debra     PHYSICIAN       Additional Comment:     _______________________________________________ Rod Mae, LCSW 02/20/2015, 2:06 PM [phone]

## 2015-02-20 NOTE — Discharge Instructions (Signed)
INSTRUCTIONS AFTER JOINT REPLACEMENT   o Remove items at home which could result in a fall. This includes throw rugs or furniture in walking pathways o ICE to the affected joint every three hours while awake for 30 minutes at a time, for at least the first 3-5 days, and then as needed for pain and swelling.  Continue to use ice for pain and swelling. You may notice swelling that will progress down to the foot and ankle.  This is normal after surgery.  Elevate your leg when you are not up walking on it.   o Continue to use the breathing machine you got in the hospital (incentive spirometer) which will help keep your temperature down.  It is common for your temperature to cycle up and down following surgery, especially at night when you are not up moving around and exerting yourself.  The breathing machine keeps your lungs expanded and your temperature down.   DIET:  As you were doing prior to hospitalization, we recommend a well-balanced diet.  DRESSING / WOUND CARE / SHOWERING  PRN  ACTIVITY  o Increase activity slowly as tolerated, but follow the weight bearing instructions below.   o No driving for 6 weeks or until further direction given by your physician.  You cannot drive while taking narcotics.  o No lifting or carrying greater than 10 lbs. until further directed by your surgeon. o Avoid periods of inactivity such as sitting longer than an hour when not asleep. This helps prevent blood clots.  o You may return to work once you are authorized by your doctor.     WEIGHT BEARING   Weight bearing as tolerated with assist device (walker, cane, etc) as directed, use it as long as suggested by your surgeon or therapist, typically at least 4-6 weeks. No CPM   EXERCISES  Results after joint replacement surgery are often greatly improved when you follow the exercise, range of motion and muscle strengthening exercises prescribed by your doctor. Safety measures are also important to protect  the joint from further injury. Any time any of these exercises cause you to have increased pain or swelling, decrease what you are doing until you are comfortable again and then slowly increase them. If you have problems or questions, call your caregiver or physical therapist for advice.   Rehabilitation is important following a joint replacement. After just a few days of immobilization, the muscles of the leg can become weakened and shrink (atrophy).  These exercises are designed to build up the tone and strength of the thigh and leg muscles and to improve motion. Often times heat used for twenty to thirty minutes before working out will loosen up your tissues and help with improving the range of motion but do not use heat for the first two weeks following surgery (sometimes heat can increase post-operative swelling).   These exercises can be done on a training (exercise) mat, on the floor, on a table or on a bed. Use whatever works the best and is most comfortable for you.    Use music or television while you are exercising so that the exercises are a pleasant break in your day. This will make your life better with the exercises acting as a break in your routine that you can look forward to.   Perform all exercises about fifteen times, three times per day or as directed.  You should exercise both the operative leg and the other leg as well.  Exercises include:  Quad Sets - Tighten up the muscle on the front of the thigh (Quad) and hold for 5-10 seconds.    Straight Leg Raises - With your knee straight (if you were given a brace, keep it on), lift the leg to 60 degrees, hold for 3 seconds, and slowly lower the leg.  Perform this exercise against resistance later as your leg gets stronger.   Leg Slides: Lying on your back, slowly slide your foot toward your buttocks, bending your knee up off the floor (only go as far as is comfortable). Then slowly slide your foot back down until your leg is flat on  the floor again.   Angel Wings: Lying on your back spread your legs to the side as far apart as you can without causing discomfort.   Hamstring Strength:  Lying on your back, push your heel against the floor with your leg straight by tightening up the muscles of your buttocks.  Repeat, but this time bend your knee to a comfortable angle, and push your heel against the floor.  You may put a pillow under the heel to make it more comfortable if necessary.   A rehabilitation program following joint replacement surgery can speed recovery and prevent re-injury in the future due to weakened muscles. Contact your doctor or a physical therapist for more information on knee rehabilitation.    CONSTIPATION  Constipation is defined medically as fewer than three stools per week and severe constipation as less than one stool per week.  Even if you have a regular bowel pattern at home, your normal regimen is likely to be disrupted due to multiple reasons following surgery.  Combination of anesthesia, postoperative narcotics, change in appetite and fluid intake all can affect your bowels.   YOU MUST use at least one of the following options; they are listed in order of increasing strength to get the job done.  They are all available over the counter, and you may need to use some, POSSIBLY even all of these options:    Drink plenty of fluids (prune juice may be helpful) and high fiber foods Colace 100 mg by mouth twice a day  Senokot for constipation as directed and as needed Dulcolax (bisacodyl), take with full glass of water  Miralax (polyethylene glycol) once or twice a day as needed.  If you have tried all these things and are unable to have a bowel movement in the first 3-4 days after surgery call either your surgeon or your primary doctor.    If you experience loose stools or diarrhea, hold the medications until you stool forms back up.  If your symptoms do not get better within 1 week or if they get  worse, check with your doctor.  If you experience "the worst abdominal pain ever" or develop nausea or vomiting, please contact the office immediately for further recommendations for treatment.   ITCHING:  If you experience itching with your medications, try taking only a single pain pill, or even half a pain pill at a time.  You can also use Benadryl over the counter for itching or also to help with sleep.   TED HOSE STOCKINGS:  Use stockings on both legs until for at least 2 weeks or as directed by physician office. They may be removed at night for sleeping.  MEDICATIONS:  See your medication summary on the After Visit Summary that nursing will review with you.  You may have some home medications which will be placed on hold until  you complete the course of blood thinner medication.  It is important for you to complete the blood thinner medication as prescribed.  PRECAUTIONS:  If you experience chest pain or shortness of breath - call 911 immediately for transfer to the hospital emergency department.   If you develop a fever greater that 101 F, purulent drainage from wound, increased redness or drainage from wound, foul odor from the wound/dressing, or calf pain - CONTACT YOUR SURGEON.                                                   FOLLOW-UP APPOINTMENTS:  If you do not already have a post-op appointment, please call the office for an appointment to be seen by your surgeon.  Guidelines for how soon to be seen are listed in your After Visit Summary, but are typically between 1-4 weeks after surgery.  OTHER INSTRUCTIONS:   Knee Replacement:  Do not place pillow under knee, focus on keeping the knee straight while resting. CPM instructions: 0-90 degrees, 2 hours in the morning, 2 hours in the afternoon, and 2 hours in the evening. Place foam block, curve side up under heel at all times except when in CPM or when walking.  DO NOT modify, tear, cut, or change the foam block in any way.  MAKE  SURE YOU:   Understand these instructions.   Get help right away if you are not doing well or get worse.    Thank you for letting us be a part of your medical care team.  It is a privilege we respect greatly.  We hope these instructions will help you stay on track for a fast and full recovery!

## 2015-02-20 NOTE — Progress Notes (Signed)
Patient ID: Carrie Best, female   DOB: 1928/08/28, 81 y.o.   MRN: [id number] PATIENT ID: Carrie Best  MRN: [id number]  DOB/AGE:  10-19-1927 / 81 y.o.  3 Days Post-Op Procedure(s) (LRB): TOTAL KNEE ARTHROPLASTY (Right)    PROGRESS NOTE Subjective: Patient is alert, oriented, no Nausea, no Vomiting, yes passing gas, no Bowel Movement. Taking PO well. Denies SOB, Chest or Calf Pain. Using Incentive Spirometer, PAS in place. Ambulate bed to chair with maximum assist still has poor balance, CPM 0-60 Patient reports pain as 2 on 0-10 scale  .    Objective: Vital signs in last 24 hours: Filed Vitals:   02/19/15 1000 02/19/15 1506 02/19/15 1954 02/20/15 0459  BP: 128/50 156/60 121/48 152/58  Pulse: 65 81 80 79  Temp: 99 F (37.2 C) 98.7 F (37.1 C) 97.3 F (36.3 C) 98.2 F (36.8 C)  TempSrc: Oral Oral    Resp: 18 18 18 18   Height:      Weight:      SpO2: 96% 98% 96% 96%      Intake/Output from previous day: I/O last 3 completed shifts: In: 1200 [P.O.:1200] Out: -    Intake/Output this shift:     LABORATORY DATA:  Recent Labs  02/17/15 1110 02/17/15 1520 02/18/15 0520 02/19/15 0550  WBC  --   --  11.9* 10.8*  HGB  --   --  10.4* 9.4*  HCT  --   --  31.7* 28.0*  PLT  --   --  199 187  NA  --   --  133*  --   K  --   --  3.8  --   CL  --   --  99*  --   CO2  --   --  27  --   BUN  --   --  10  --   CREATININE  --   --  0.98  --   GLUCOSE  --   --  233*  --   INR 3.27* 2.85*  --   --   CALCIUM  --   --  8.8*  --     Examination: Neurologically intact ABD soft Neurovascular intact Sensation intact distally Intact pulses distally Dorsiflexion/Plantar flexion intact Incision: scant drainage No cellulitis present Compartment soft} Dressing removed and patient has moderate ecchymosis around the the calf and knee. She was placed on higher doses of Xarelto yesterday at 20 mg and I suspect this is the culprit.  Assessment:   3 Days Post-Op Procedure(s)  (LRB): TOTAL KNEE ARTHROPLASTY (Right), ecchymosis around total knee and calf probably secondary to's a role toe. ADDITIONAL DIAGNOSIS: Expected Acute Blood Loss Anemia, HTN, CHF, Dementia, < hearing,afib,   Plan: PT/OT WBAT, CPM is discontinued secondary to ecchymosis DVT Prophylaxis:  SCDx72hrs, Xarelto 10 mg daily for the next 2 weeks then resume normal dosing   DISCHARGE PLAN: Skilled Nursing Facility/Rehab, [facility]  DISCHARGE NEEDS: HHPT, Walker, 3-1 commode no CPM     Joei Frangos J 02/20/2015, 7:06 AM

## 2015-02-20 NOTE — Discharge Summary (Signed)
Patient ID: Carrie Best MRN: [id number] DOB/AGE: 1927/11/01 81 y.o.  Admit date: 02/17/2015 Discharge date: 02/20/2015  Admission Diagnoses:  Principal Problem:   Primary osteoarthritis of right knee Valgus Active Problems:   Arthritis of knee   Discharge Diagnoses:  Same  Past Medical History  Diagnosis Date  . Hypertension   . Arthritis   . Diastolic dysfunction   . Hyperlipidemia   . Congestive heart failure     Congestive heart failure symptoms  . Heart murmur   . Dysrhythmia 05-10-12    hx. A. Fib., rate is controlled-tx. Xarelto  . Pneumonia 05-10-12    dx. in July- tx. antibiotics  . Hearing loss 05-10-12    bilateral hearing aids.  Marland Kitchen GERD (gastroesophageal reflux disease) 05-10-12    tx. with meds as needed  . Depression   . Stroke 05-10-12    Lt.CVA note on scans-hx. freq. falls/ none past 6 months 01/21/14  . Wears hearing aid     bilateral  . Dyspnea     occasionally, with exertion  . Anxiety   . History of kidney stones     Surgeries: Procedure(s): TOTAL KNEE ARTHROPLASTY on 02/17/2015   Consultants:    Discharged Condition: Improved  Hospital Course: Carrie Best is an 81 y.o. female who was admitted 02/17/2015 for operative treatment ofPrimary osteoarthritis of right knee. Patient has severe unremitting pain that affects sleep, daily activities, and work/hobbies. After pre-op clearance the patient was taken to the operating room on 02/17/2015 and underwent  Procedure(s): TOTAL KNEE ARTHROPLASTY.    Patient was given perioperative antibiotics: Anti-infectives    Start     Dose/Rate Route Frequency Ordered Stop   02/17/15 1239  cefUROXime (ZINACEF) injection  Status:  Discontinued       As needed 02/17/15 1239 02/17/15 1358   02/17/15 1057  vancomycin (VANCOCIN) IVPB 1000 mg/200 mL premix     1,000 mg 200 mL/hr over 60 Minutes Intravenous On call to O.R. 02/17/15 1057 02/17/15 1311       Patient was given sequential compression devices, early  ambulation, and chemoprophylaxis to prevent DVT. She was on a higher doses Xarelto at 20 mg a day briefly and sustained some ecchymosis around the calf and knee wound and the Xarelto was adjusted to 10 mg a day for the next 2 weeks. CPM will also be discontinued although she may continue physical therapy weightbearing as tolerated with gentle range of motion.  Patient benefited maximally from hospital stay and there were no complications.    Recent vital signs: Patient Vitals for the past 24 hrs:  BP Temp Temp src Pulse Resp SpO2  02/20/15 0459 (!) 152/58 mmHg 98.2 F (36.8 C) - 79 18 96 %  02/19/15 1954 (!) 121/48 mmHg 97.3 F (36.3 C) - 80 18 96 %  02/19/15 1506 (!) 156/60 mmHg 98.7 F (37.1 C) Oral 81 18 98 %  02/19/15 1000 (!) 128/50 mmHg 99 F (37.2 C) Oral 65 18 96 %     Recent laboratory studies:  Recent Labs  02/17/15 1110 02/17/15 1520  02/18/15 0520 02/19/15 0550 02/20/15 0629  WBC  --   --   < > 11.9* 10.8* 9.0  HGB  --   --   < > 10.4* 9.4* 9.2*  HCT  --   --   < > 31.7* 28.0* 27.1*  PLT  --   --   < > 199 187 172  NA  --   --   --  133*  --   --   K  --   --   --  3.8  --   --   CL  --   --   --  99*  --   --   CO2  --   --   --  27  --   --   BUN  --   --   --  10  --   --   CREATININE  --   --   --  0.98  --   --   GLUCOSE  --   --   --  233*  --   --   INR 3.27* 2.85*  --   --   --   --   CALCIUM  --   --   --  8.8*  --   --   < > = values in this interval not displayed.   Discharge Medications:     Medication List    STOP taking these medications        HYDROcodone-acetaminophen 5-325 MG per tablet  Commonly known as:  NORCO      TAKE these medications        atorvastatin 20 MG tablet  Commonly known as:  LIPITOR  Take 20 mg by mouth daily.     azithromycin 250 MG tablet  Commonly known as:  ZITHROMAX  Take 250 mg by mouth daily.     diltiazem 180 MG 24 hr capsule  Commonly known as:  DILACOR XR  Take 180 mg by mouth every morning.      DULoxetine 60 MG capsule  Commonly known as:  CYMBALTA  Take 60 mg by mouth every morning.     fish oil-omega-3 fatty acids 1000 MG capsule  Take 1 g by mouth daily.     furosemide 40 MG tablet  Commonly known as:  LASIX  TAKE 1 TABLET BY MOUTH EVERY DAY     LORazepam 0.5 MG tablet  Commonly known as:  ATIVAN  Take 0.5 mg by mouth every 8 (eight) hours as needed for anxiety.     meloxicam 15 MG tablet  Commonly known as:  MOBIC  Take 15 mg by mouth daily.     metoprolol tartrate 25 MG tablet  Commonly known as:  LOPRESSOR  Take 50 mg by mouth 2 (two) times daily.     oxyCODONE-acetaminophen 5-325 MG per tablet  Commonly known as:  ROXICET  Take 1 tablet by mouth every 4 (four) hours as needed for severe pain.     potassium chloride SA 20 MEQ tablet  Commonly known as:  K-DUR,KLOR-CON  Take 1 tablet (20 mEq total) by mouth daily.     ranitidine 150 MG capsule  Commonly known as:  ZANTAC  Take 150 mg by mouth daily as needed. Reflux     rivaroxaban 10 MG Tabs tablet  Commonly known as:  XARELTO  Take 1 tablet (10 mg total) by mouth daily with breakfast.     senna-docusate 8.6-50 MG per tablet  Commonly known as:  Senokot-S  Take 1 tablet by mouth 2 (two) times daily. While taking pain meds to prevent constipation     tizanidine 2 MG capsule  Commonly known as:  ZANAFLEX  Take 1 capsule (2 mg total) by mouth 3 (three) times daily as needed for muscle spasms.     traMADol 50 MG tablet  Commonly known as:  ULTRAM  Take 1 tablet (50 mg  total) by mouth every 6 (six) hours as needed for moderate pain. Post-operatively     zolpidem 10 MG tablet  Commonly known as:  AMBIEN  Take 10 mg by mouth at bedtime as needed. For sleep        Diagnostic Studies: Dg Chest 2 View  02/07/2015   CLINICAL DATA:  Preoperative examination prior to knee replacement ; history of CHF  EXAM: CHEST  2 VIEW  COMPARISON:  PA and lateral chest x-ray of Jan 27, 2015  FINDINGS: The lungs are  mildly hyperinflated. There is no alveolar infiltrate. The interstitial markings have improved since the previous study. There is stable apical pleural thickening. The heart is top-normal in size. The pulmonary vascularity is mildly prominent centrally but more distinct today. The mediastinum is normal in width. The patient has undergone ORIF for a proximal right humeral fracture. There are old left-sided rib deformities.  IMPRESSION: Probable COPD. Improved appearance of the pulmonary interstitium consistent with resolution of interstitial edema. There is no acute cardiopulmonary abnormality.   Electronically Signed   By: David  [place]az[place]and M.D.   On: 02/07/2015 15:57   Dg Chest 2 View  01/27/2015   CLINICAL DATA:  Altered mental status.  Shortness of breath.  EXAM: CHEST  2 VIEW  COMPARISON:  01/22/2014; 05/17/2013  FINDINGS: Grossly unchanged cardiac silhouette and mediastinal contours with atherosclerotic plaque within the thoracic aorta. Minimal bibasilar opacities favored to represent atelectasis. Mild cephalization of flow with development of trace bilateral effusions. No pneumothorax. Post intra medullary rod fixation of right humerus transfixing a comminuted fracture involving the surgical neck of the right humerus, incompletely evaluated.  IMPRESSION: Mild pulmonary edema, trace pleural effusions and associated bibasilar atelectasis.   Electronically Signed   By: Simonne Come M.D.   On: 01/27/2015 23:35   Ct Head Wo Contrast  01/27/2015   CLINICAL DATA:  Altered mental status  EXAM: CT HEAD WITHOUT CONTRAST  TECHNIQUE: Contiguous axial images were obtained from the base of the skull through the vertex without intravenous contrast.  COMPARISON:  01/31/2012  FINDINGS: Skull and Sinuses:Negative for fracture or destructive process. The mastoids, middle ears, and imaged paranasal sinuses are clear.  Orbits: Bilateral cataract resection.  No acute findings.  Brain: No evidence of acute infarction, hemorrhage,  hydrocephalus, or mass lesion/mass effect. There is generalized cerebral volume loss which is typical for age and stable from 2013. Extensive chronic small vessel disease with ischemic gliosis throughout the bilateral cerebral white matter. Stable ex vacuo ventriculomegaly.  IMPRESSION: 1. No acute findings. 2. Extensive chronic small vessel disease, stable from 2013.   Electronically Signed   By: Marnee Spring M.D.   On: 01/27/2015 22:23   Mr Brain Wo Contrast  01/28/2015   CLINICAL DATA:  Somnolent today, hypercalcemia and altered mental status. History of hypertension, atrial fibrillation, stroke.  EXAM: MRI HEAD WITHOUT CONTRAST  TECHNIQUE: Multiplanar, multiecho pulse sequences of the brain and surrounding structures were obtained without intravenous contrast.  COMPARISON:  CT head Jan 27, 2015 and MRI head June 09, 2012  FINDINGS: No reduced diffusion to suggest acute ischemia. No susceptibility artifact to suggest hemorrhage. Moderate to severe ventriculomegaly, predominately on the basis of global parenchymal brain volume loss though, there is slight effacement of the sulci at the convexities. Confluent supratentorial white matter T2 hyperintensities are stable to slightly worse. No midline shift, mass effect or mass lesions.  No abnormal extra-axial fluid collections. Normal major intracranial vascular flow voids seen at the skull base.  Status post bilateral ocular lens implants. Paranasal sinuses and mastoid air cells are well aerated. Severe temporomandibular osteoarthrosis. No abnormal sellar expansion. No cerebellar tonsillar ectopia. No suspicious calvarial bone marrow signal.  IMPRESSION: No acute intracranial process.  Moderate to severe global parenchymal brain volume loss with a component of suspected normal pressure hydrocephalus.  Moderate to severe white matter changes compatible chronic small vessel ischemic disease.   Electronically Signed   By: Awilda Metro   On: 01/28/2015 04:57     Disposition: 06-Home-Health Care Svc      Discharge Instructions    CPM    Complete by:  As directed   No CPM secondary to calf and knee ecchymosis     Call MD / Call 911    Complete by:  As directed   If you experience chest pain or shortness of breath, CALL 911 and be transported to the hospital emergency room.  If you develope a fever above 101 F, pus (white drainage) or increased drainage or redness at the wound, or calf pain, call your surgeon's office.     Change dressing    Complete by:  As directed   Change dressing PRN     Constipation Prevention    Complete by:  As directed   Drink plenty of fluids.  Prune juice may be helpful.  You may use a stool softener, such as Colace (over the counter) 100 mg twice a day.  Use MiraLax (over the counter) for constipation as needed.     Diet - low sodium heart healthy    Complete by:  As directed      Increase activity slowly as tolerated    Complete by:  As directed            Follow-up Information    Follow up with [facility].   Why:  They will contact you to schedule home therapy visits.   Contact information:   [address] [place] [address] [phone]       Follow up with Nestor Lewandowsky, MD In 1 week.   Specialty:  Orthopedic Surgery   Contact information:   [address] [place] [address] [phone]        Signed: Nestor Lewandowsky 02/20/2015, 7:24 AM

## 2015-02-21 ENCOUNTER — Non-Acute Institutional Stay (SKILLED_NURSING_FACILITY): Payer: Medicare Other | Admitting: Adult Health

## 2015-02-21 ENCOUNTER — Encounter: Payer: Self-pay | Admitting: Adult Health

## 2015-02-21 DIAGNOSIS — F419 Anxiety disorder, unspecified: Secondary | ICD-10-CM

## 2015-02-21 DIAGNOSIS — I482 Chronic atrial fibrillation, unspecified: Secondary | ICD-10-CM

## 2015-02-21 DIAGNOSIS — M1711 Unilateral primary osteoarthritis, right knee: Secondary | ICD-10-CM | POA: Diagnosis not present

## 2015-02-21 DIAGNOSIS — E785 Hyperlipidemia, unspecified: Secondary | ICD-10-CM | POA: Diagnosis not present

## 2015-02-21 DIAGNOSIS — I10 Essential (primary) hypertension: Secondary | ICD-10-CM

## 2015-02-21 DIAGNOSIS — D62 Acute posthemorrhagic anemia: Secondary | ICD-10-CM

## 2015-02-21 DIAGNOSIS — G4700 Insomnia, unspecified: Secondary | ICD-10-CM | POA: Diagnosis not present

## 2015-02-21 DIAGNOSIS — F32A Depression, unspecified: Secondary | ICD-10-CM

## 2015-02-21 DIAGNOSIS — E876 Hypokalemia: Secondary | ICD-10-CM | POA: Diagnosis not present

## 2015-02-21 DIAGNOSIS — K5900 Constipation, unspecified: Secondary | ICD-10-CM

## 2015-02-21 DIAGNOSIS — I5032 Chronic diastolic (congestive) heart failure: Secondary | ICD-10-CM

## 2015-02-21 DIAGNOSIS — F329 Major depressive disorder, single episode, unspecified: Secondary | ICD-10-CM | POA: Diagnosis not present

## 2015-02-21 DIAGNOSIS — K219 Gastro-esophageal reflux disease without esophagitis: Secondary | ICD-10-CM | POA: Diagnosis not present

## 2015-02-25 ENCOUNTER — Non-Acute Institutional Stay (SKILLED_NURSING_FACILITY): Payer: Medicare Other | Admitting: Internal Medicine

## 2015-02-25 DIAGNOSIS — E871 Hypo-osmolality and hyponatremia: Secondary | ICD-10-CM

## 2015-02-25 DIAGNOSIS — I5032 Chronic diastolic (congestive) heart failure: Secondary | ICD-10-CM | POA: Diagnosis not present

## 2015-02-25 DIAGNOSIS — I482 Chronic atrial fibrillation, unspecified: Secondary | ICD-10-CM

## 2015-02-25 DIAGNOSIS — D62 Acute posthemorrhagic anemia: Secondary | ICD-10-CM

## 2015-02-25 DIAGNOSIS — E785 Hyperlipidemia, unspecified: Secondary | ICD-10-CM | POA: Diagnosis not present

## 2015-02-25 DIAGNOSIS — K5900 Constipation, unspecified: Secondary | ICD-10-CM

## 2015-02-25 DIAGNOSIS — M1711 Unilateral primary osteoarthritis, right knee: Secondary | ICD-10-CM

## 2015-02-25 DIAGNOSIS — F418 Other specified anxiety disorders: Secondary | ICD-10-CM

## 2015-02-25 DIAGNOSIS — K219 Gastro-esophageal reflux disease without esophagitis: Secondary | ICD-10-CM | POA: Diagnosis not present

## 2015-02-25 DIAGNOSIS — I10 Essential (primary) hypertension: Secondary | ICD-10-CM | POA: Diagnosis not present

## 2015-02-25 NOTE — Progress Notes (Signed)
Patient ID: Carrie Best, female   DOB: 1928/07/24, 81 y.o.   MRN: [id number]   02/21/15  Facility:  Nursing Home Location:  [facility] Room Number: 805-2 LEVEL OF CARE:  SNF (31)    Chief Complaint  Patient presents with  . Hospitalization Follow-up    Osteoarthritis S/P right total knee arthroplasty, hyperlipidemia, hypertension, atrial fibrillation, CHF, depression, anxiety, hypokalemia, constipation, GERD, insomnia and anemia    HISTORY OF PRESENT ILLNESS:  This is an 81 year old female who has been admitted to [facility] on 02/20/15 from [facility] with osteoarthritis S/P right total knee arthroplasty. She has PMH of hypertension, arthritis, hyperlipidemia, CHF, left CVA and dysrhythmia.  She has been admitted for a short-term rehabilitation.  PAST MEDICAL HISTORY:  Past Medical History  Diagnosis Date  . Hypertension   . Arthritis   . Diastolic dysfunction   . Hyperlipidemia   . Congestive heart failure     Congestive heart failure symptoms  . Heart murmur   . Dysrhythmia 05-10-12    hx. A. Fib., rate is controlled-tx. Xarelto  . Pneumonia 05-10-12    dx. in July- tx. antibiotics  . Hearing loss 05-10-12    bilateral hearing aids.  Marland Kitchen GERD (gastroesophageal reflux disease) 05-10-12    tx. with meds as needed  . Depression   . Stroke 05-10-12    Lt.CVA note on scans-hx. freq. falls/ none past 6 months 01/21/14  . Wears hearing aid     bilateral  . Dyspnea     occasionally, with exertion  . Anxiety   . History of kidney stones     CURRENT MEDICATIONS: Reviewed per MAR/see medication list  Allergies  Allergen Reactions  . Codeine Nausea And Vomiting  . Penicillins Hives  . Sulfur Nausea And Vomiting     REVIEW OF SYSTEMS:  GENERAL: no change in appetite, no fatigue, no weight changes, no fever, chills or weakness RESPIRATORY: no cough, SOB, DOE, wheezing, hemoptysis CARDIAC: no chest pain, or palpitations GI: no  abdominal pain, diarrhea, constipation, heart burn, nausea or vomiting  PHYSICAL EXAMINATION  GENERAL: no acute distress, normal body habitus SKIN:  Right knee surgical incision is dry, no erythema; covered with dry dressing and Ace wrap EYES: conjunctivae normal, sclerae normal, normal eye lids NECK: supple, trachea midline, no neck masses, no thyroid tenderness, no thyromegaly LYMPHATICS: no LAN in the neck, no supraclavicular LAN RESPIRATORY: breathing is even & unlabored, BS CTAB CARDIAC: RRR, no murmur,no extra heart sounds, RLE edema 1+ GI: abdomen soft, normal BS, no masses, no tenderness, no hepatomegaly, no splenomegaly EXTREMITIES:  Able to move 4 extremities PSYCHIATRIC: the patient is alert & oriented to person, affect & behavior appropriate  LABS/RADIOLOGY: Labs reviewed: Basic Metabolic Panel:  Recent Labs  40/98/11 0218 02/07/15 1547 02/18/15 0520  NA 140 135 133*  K 3.5 3.9 3.8  CL 107 102 99*  CO2 GLUCOSE 128* 186* 233*  BUN CREATININE 0.86 1.07* 0.98  CALCIUM 10.5* 9.7 8.8*  MG 2.0  --   --   PHOS 3.9  --   --    Liver Function Tests:  Recent Labs  01/28/15 0218  AST 19  ALT 11*  ALKPHOS 101  BILITOT 0.7  PROT 6.4*  ALBUMIN 3.3*   CBC:  Recent Labs  02/07/15 1547 02/18/15 0520 02/19/15 0550 02/20/15 0629  WBC 7.2 11.9* 10.8* 9.0  NEUTROABS 4.7  --   --   --  HGB 12.0 10.4* 9.4* 9.2*  HCT 36.1 31.7* 28.0* 27.1*  MCV 87.8 86.8 85.9 84.4  PLT 247 199 187 172   CBG:  Recent Labs  01/28/15 0047  GLUCAP 108*    Dg Chest 2 View  02/07/2015   CLINICAL DATA:  Preoperative examination prior to knee replacement ; history of CHF  EXAM: CHEST  2 VIEW  COMPARISON:  PA and lateral chest x-ray of Jan 27, 2015  FINDINGS: The lungs are mildly hyperinflated. There is no alveolar infiltrate. The interstitial markings have improved since the previous study. There is stable apical pleural thickening. The heart is top-normal in  size. The pulmonary vascularity is mildly prominent centrally but more distinct today. The mediastinum is normal in width. The patient has undergone ORIF for a proximal right humeral fracture. There are old left-sided rib deformities.  IMPRESSION: Probable COPD. Improved appearance of the pulmonary interstitium consistent with resolution of interstitial edema. There is no acute cardiopulmonary abnormality.   Electronically Signed   By: David  [place] M.D.   On: 02/07/2015 15:57   Dg Chest 2 View  01/27/2015   CLINICAL DATA:  Altered mental status.  Shortness of breath.  EXAM: CHEST  2 VIEW  COMPARISON:  01/22/2014; 05/17/2013  FINDINGS: Grossly unchanged cardiac silhouette and mediastinal contours with atherosclerotic plaque within the thoracic aorta. Minimal bibasilar opacities favored to represent atelectasis. Mild cephalization of flow with development of trace bilateral effusions. No pneumothorax. Post intra medullary rod fixation of right humerus transfixing a comminuted fracture involving the surgical neck of the right humerus, incompletely evaluated.  IMPRESSION: Mild pulmonary edema, trace pleural effusions and associated bibasilar atelectasis.   Electronically Signed   By: Simonne Come M.D.   On: 01/27/2015 23:35   Ct Head Wo Contrast  01/27/2015   CLINICAL DATA:  Altered mental status  EXAM: CT HEAD WITHOUT CONTRAST  TECHNIQUE: Contiguous axial images were obtained from the base of the skull through the vertex without intravenous contrast.  COMPARISON:  01/31/2012  FINDINGS: Skull and Sinuses:Negative for fracture or destructive process. The mastoids, middle ears, and imaged paranasal sinuses are clear.  Orbits: Bilateral cataract resection.  No acute findings.  Brain: No evidence of acute infarction, hemorrhage, hydrocephalus, or mass lesion/mass effect. There is generalized cerebral volume loss which is typical for age and stable from 2013. Extensive chronic small vessel disease with ischemic gliosis  throughout the bilateral cerebral white matter. Stable ex vacuo ventriculomegaly.  IMPRESSION: 1. No acute findings. 2. Extensive chronic small vessel disease, stable from 2013.   Electronically Signed   By: Marnee Spring M.D.   On: 01/27/2015 22:23   Mr Brain Wo Contrast  01/28/2015   CLINICAL DATA:  Somnolent today, hypercalcemia and altered mental status. History of hypertension, atrial fibrillation, stroke.  EXAM: MRI HEAD WITHOUT CONTRAST  TECHNIQUE: Multiplanar, multiecho pulse sequences of the brain and surrounding structures were obtained without intravenous contrast.  COMPARISON:  CT head Jan 27, 2015 and MRI head June 09, 2012  FINDINGS: No reduced diffusion to suggest acute ischemia. No susceptibility artifact to suggest hemorrhage. Moderate to severe ventriculomegaly, predominately on the basis of global parenchymal brain volume loss though, there is slight effacement of the sulci at the convexities. Confluent supratentorial white matter T2 hyperintensities are stable to slightly worse. No midline shift, mass effect or mass lesions.  No abnormal extra-axial fluid collections. Normal major intracranial vascular flow voids seen at the skull base. Status post bilateral ocular lens implants. Paranasal sinuses and mastoid  air cells are well aerated. Severe temporomandibular osteoarthrosis. No abnormal sellar expansion. No cerebellar tonsillar ectopia. No suspicious calvarial bone marrow signal.  IMPRESSION: No acute intracranial process.  Moderate to severe global parenchymal brain volume loss with a component of suspected normal pressure hydrocephalus.  Moderate to severe white matter changes compatible chronic small vessel ischemic disease.   Electronically Signed   By: Awilda Metro   On: 01/28/2015 04:57    ASSESSMENT/PLAN:  Osteoarthritis S/P right total knee arthroplasty - for rehabilitation; follow-up with Dr. Turner Daniels, orthopedic surgeon, in one week; continue Mobic15 mg 1 tab by mouth  daily , Percocet 5/325 mg 1 tab by mouth every 4 hours when necessary and tramadol 50 mg 1 tab by mouth every 6 hours when necessary for pain; Xarelto 10 mg 1 tab by mouth daily for DVT prophylaxis; and Zanaflex 2 mg 1 capsule by mouth 3 times a day when necessary for muscle spasm Hyperlipidemia - continue Lipitor 20 mg 1 tab by mouth daily Hypertension - continue diltiazem 180 mg 24-hour 1 capsule by mouth every morning, Lasix 40 mg 1 tab by mouth daily and Lopressor 50 mg 1 tab by mouth twice a day Atrial fibrillation - rate controlled; continue diltiazem 180 mg 24-hour 1 capsule by mouth every morning, Lopressor 50 mg 1 by mouth twice a day and Xarelto 10 mg 1 tab by mouth daily Chronic diastolic CHF - continue Lasix 40 mg 1 tab by mouth daily Depression - mood is stable; continue Cymbalta 60 mg 1 tab by mouth every morning Anxiety - continue Ativan 0.5 mg 1 tab by mouth every 8 hours when necessary Hypokalemia - K3.8; continue KCl 20  MEQ 1 by mouth daily Constipation - continue senna S1 tab by mouth twice a day GERD - continue ranitidine 150 mg by mouth daily when necessary Insomnia - continue Ambien 10 mg 1 by mouth daily at bedtime when necessary Anemia, acute blood loss - hemoglobin 9.2; will monitor    Goals of care:  Short-term rehabilitation   Labs/test ordered:  CBC and BMP  Spent 50 minutes in patient care.   [facility], NP [employer] [phone]

## 2015-02-25 NOTE — Progress Notes (Signed)
Patient ID: Carrie Best, female   DOB: 29-Jan-1928, 81 y.o.   MRN: [id number]     [facility]   PCP: Lillia Mountain, MD  Code Status: full code  Allergies  Allergen Reactions  . Codeine Nausea And Vomiting  . Penicillins Hives  . Sulfur Nausea And Vomiting    Chief Complaint  Patient presents with  . New Admit To SNF    new admission to SNF     HPI:  81 year old patient is here for short term rehabilitation post hospital admission from 02/17/15-02/20/15 with primary OA of right knee. She underwent right total knee arthroplasty. She is seen in her room today. Her pain is under control. Denies any concerns. No concerns from nursing staff. She has PMH of diastolic CHF, afib, HTN, GERD among others  Review of Systems:  Constitutional: Negative for fever, chills, diaphoresis.  HENT: Negative for headache, congestion, nasal discharge Eyes: Negative for eye pain, blurred vision, double vision and discharge.  Respiratory: Negative for cough, shortness of breath and wheezing.   Cardiovascular: Negative for chest pain, palpitations. Positive for leg swelling.  Gastrointestinal: Negative for heartburn, nausea, vomiting, abdominal pain. Had bowel movement yesterday Genitourinary: Negative for dysuria Musculoskeletal: Negative for back pain, falls Skin: Negative for itching, rash.  Neurological: Negative for dizziness, tingling Psychiatric/Behavioral: Negative for depression   Past Medical History  Diagnosis Date  . Hypertension   . Arthritis   . Diastolic dysfunction   . Hyperlipidemia   . Congestive heart failure     Congestive heart failure symptoms  . Heart murmur   . Dysrhythmia 05-10-12    hx. A. Fib., rate is controlled-tx. Xarelto  . Pneumonia 05-10-12    dx. in July- tx. antibiotics  . Hearing loss 05-10-12    bilateral hearing aids.  Marland Kitchen GERD (gastroesophageal reflux disease) 05-10-12    tx. with meds as needed  . Depression   .  Stroke 05-10-12    Lt.CVA note on scans-hx. freq. falls/ none past 6 months 01/21/14  . Wears hearing aid     bilateral  . Dyspnea     occasionally, with exertion  . Anxiety   . History of kidney stones    Past Surgical History  Procedure Laterality Date  . Appendectomy    . Other surgical history      Bladder Surgery  . Interstim implant placement  05-10-12    now malfunctioning  . Cataract extraction, bilateral  05-10-12    bilateral  . Interstim implant removal  05/16/2012    Procedure: REMOVAL OF INTERSTIM IMPLANT;  Surgeon: Martina Sinner, MD;  Location: [facility];  Service: Urology;  Laterality: N/A;  . Cystoscopy with retrograde pyelogram, ureteroscopy and stent placement Right 01/31/2014    Procedure: CYSTOSCOPY WITH bilateral  RETROGRADE PYELOGRAM, right URETEROSCOPY AND right  STENT PLACEMENT, bladder biopsy with fulgeration;  Surgeon: Sebastian Ache, MD;  Location: [facility];  Service: Urology;  Laterality: Right;  . Holmium laser application Right 01/31/2014    Procedure: HOLMIUM LASER APPLICATION;  Surgeon: Sebastian Ache, MD;  Location: [facility];  Service: Urology;  Laterality: Right;  . Humerus im nail Right 06/10/2014    Procedure: INTRAMEDULLARY (IM) NAIL HUMERAL;  Surgeon: Mable Paris, MD;  Location: [facility];  Service: Orthopedics;  Laterality: Right;  Right intramedullary humeral nail  . Hardware removal Right 09/09/2014    Procedure: HARDWARE REMOVAL RIGHT SHOULDER;  Surgeon: Mable Paris, MD;  Location: [facility];  Service: Orthopedics;  Laterality: Right;  Right shoulder hardware removal  . Total knee arthroplasty Right 02/17/2015    Procedure: TOTAL KNEE ARTHROPLASTY;  Surgeon: Gean Birchwood, MD;  Location: [facility];  Service: Orthopedics;  Laterality: Right;   Social History:   reports that she has never smoked. She has never used smokeless tobacco. She reports that she does not drink alcohol or use illicit drugs.  Family History  Problem  Relation Age of Onset  . Stroke Mother   . Stroke Brother     Medications: Patient's Medications  New Prescriptions   No medications on file  Previous Medications   ATORVASTATIN (LIPITOR) 20 MG TABLET    Take 20 mg by mouth daily.   AZITHROMYCIN (ZITHROMAX) 250 MG TABLET    Take 250 mg by mouth daily.   DILTIAZEM (DILACOR XR) 180 MG 24 HR CAPSULE    Take 180 mg by mouth every morning.   DULOXETINE (CYMBALTA) 60 MG CAPSULE    Take 60 mg by mouth every morning.    FISH OIL-OMEGA-3 FATTY ACIDS 1000 MG CAPSULE    Take 1 g by mouth daily.   FUROSEMIDE (LASIX) 40 MG TABLET    TAKE 1 TABLET BY MOUTH EVERY DAY   LORAZEPAM (ATIVAN) 0.5 MG TABLET    Take 0.5 mg by mouth every 8 (eight) hours as needed for anxiety.   MELOXICAM (MOBIC) 15 MG TABLET    Take 15 mg by mouth daily.   METOPROLOL TARTRATE (LOPRESSOR) 25 MG TABLET    Take 50 mg by mouth 2 (two) times daily.    OXYCODONE-ACETAMINOPHEN (ROXICET) 5-325 MG PER TABLET    Take 1 tablet by mouth every 4 (four) hours as needed for severe pain.   POTASSIUM CHLORIDE SA (K-DUR,KLOR-CON) 20 MEQ TABLET    Take 1 tablet (20 mEq total) by mouth daily.   RANITIDINE (ZANTAC) 150 MG CAPSULE    Take 150 mg by mouth daily as needed. Reflux   RIVAROXABAN (XARELTO) 10 MG TABS TABLET    Take 1 tablet (10 mg total) by mouth daily with breakfast.   SENNA-DOCUSATE (SENOKOT-S) 8.6-50 MG PER TABLET    Take 1 tablet by mouth 2 (two) times daily. While taking pain meds to prevent constipation   TIZANIDINE (ZANAFLEX) 2 MG CAPSULE    Take 1 capsule (2 mg total) by mouth 3 (three) times daily as needed for muscle spasms.   TRAMADOL (ULTRAM) 50 MG TABLET    Take 1 tablet (50 mg total) by mouth every 6 (six) hours as needed for moderate pain. Post-operatively   ZOLPIDEM (AMBIEN) 10 MG TABLET    Take 10 mg by mouth at bedtime as needed. For sleep  Modified Medications   No medications on file  Discontinued Medications   No medications on file     Physical Exam: Filed  Vitals:   02/25/15 1328  BP: 127/60  Pulse: 82  Temp: 96.8 F (36 C)  Resp: 17  Weight: 192 lb (87.091 kg)  SpO2: 97%    General- elderly female, obese, in no acute distress Head- normocephalic, atraumatic Throat- moist mucus membrane  Eyes- PERRLA, EOMI, no pallor, no icterus, no discharge, normal conjunctiva, normal sclera Neck- no cervical lymphadenopathy Cardiovascular- normal s1,s2, palpable dorsalis pedis and radial pulses, 1+ right leg edema Respiratory- bilateral clear to auscultation, no wheeze, no rhonchi, no crackles, no use of accessory muscles Abdomen- bowel sounds present, soft, non tender Musculoskeletal- able to move all 4 extremities, right  knee range of motion limited Neurological- no focal deficit Skin- warm and dry, right knee surgical incision healing well with steri strips and clean dressing, bruises noted Psychiatry- alert and oriented, normal mood and affect    Labs reviewed: Basic Metabolic Panel:  Recent Labs  16/10/96 0218 02/07/15 1547 02/18/15 0520  NA 140 135 133*  K 3.5 3.9 3.8  CL 107 102 99*  CO2 GLUCOSE 128* 186* 233*  BUN CREATININE 0.86 1.07* 0.98  CALCIUM 10.5* 9.7 8.8*  MG 2.0  --   --   PHOS 3.9  --   --    Liver Function Tests:  Recent Labs  01/28/15 0218  AST 19  ALT 11*  ALKPHOS 101  BILITOT 0.7  PROT 6.4*  ALBUMIN 3.3*   No results for input(s): LIPASE, AMYLASE in the last 8760 hours. No results for input(s): AMMONIA in the last 8760 hours. CBC:  Recent Labs  02/07/15 1547 02/18/15 0520 02/19/15 0550 02/20/15 0629  WBC 7.2 11.9* 10.8* 9.0  NEUTROABS 4.7  --   --   --   HGB 12.0 10.4* 9.4* 9.2*  HCT 36.1 31.7* 28.0* 27.1*  MCV 87.8 86.8 85.9 84.4  PLT 247 199 187 172   02/24/15 wbc 7.6, hb 8.9, hct 26.2, mcv 86.2, na 136, k 4.2, bun 19, cr 1.04, ca 8.9  Assessment/Plan  Right knee Osteoarthritis  S/P right total knee arthroplasty. Will have her work with physical therapy and  occupational therapy team to help with gait training and muscle strengthening exercises.fall precautions. Skin care. Encourage to be out of bed. Has follow up with dr Turner Daniels orthopedics. Continue mobic 15 mg daily with percocet 5-325 q4h prn pain. Continue zanaflex 2 mg q8h prn muscle spasm. Continue xarelto for dvt prophylaxis. Add ted hose  Acute blood loss anemia Baseline hb 12, 9.2 at discharge and 8.9 now. Hemodynamically stable. Start ferrous sulfate 325 mg bid for now, check cbc in 1 week for stability  Hyponatremia Resolved, na now 136. Monitor clinically  afib Rate controlled. Continue diltiazem 180 mg daily and lopressor 50 mg bid for rate control, cotninue xarelto for anticoagulation  Chronic diastolic chf Euvolemic, continue lasix 40 mg daily, lopressor 50 mg bid,monitor weight, continue kcl supplement. Bmp reviewed  Constipation continue senna S bid and hydration encouraged  Hypertension Stable, continue current regimen of lopressor and lasix  Hyperlipidemia continue Lipitor 20 mg daily  Depression and anxiety Stable, continue ativan 0.5 mg q8h prn and cymbalta 60 mg daily  GERD Stable,continue ranitidine 150 mg daily prn  Unclear about being on azithromycin. Reviewed her prior admission/ discharge note from may 2016. No clear indication for azithromycin. Discontinue it   Goals of care: short term rehabilitation   Labs/tests ordered: cbc in 1 week  Family/ staff Communication: reviewed care plan with patient and nursing supervisor    Oneal Grout, MD  [facility] [phone] (Monday-Friday 8 am - 5 pm) [phone] (afterhours)

## 2015-03-06 ENCOUNTER — Encounter: Payer: Self-pay | Admitting: Adult Health

## 2015-03-06 ENCOUNTER — Non-Acute Institutional Stay (SKILLED_NURSING_FACILITY): Payer: Medicare Other | Admitting: Adult Health

## 2015-03-06 DIAGNOSIS — F32A Depression, unspecified: Secondary | ICD-10-CM

## 2015-03-06 DIAGNOSIS — F329 Major depressive disorder, single episode, unspecified: Secondary | ICD-10-CM

## 2015-03-06 DIAGNOSIS — D62 Acute posthemorrhagic anemia: Secondary | ICD-10-CM

## 2015-03-06 DIAGNOSIS — K5900 Constipation, unspecified: Secondary | ICD-10-CM | POA: Diagnosis not present

## 2015-03-06 DIAGNOSIS — M1711 Unilateral primary osteoarthritis, right knee: Secondary | ICD-10-CM

## 2015-03-06 DIAGNOSIS — E785 Hyperlipidemia, unspecified: Secondary | ICD-10-CM | POA: Diagnosis not present

## 2015-03-06 DIAGNOSIS — I5032 Chronic diastolic (congestive) heart failure: Secondary | ICD-10-CM | POA: Diagnosis not present

## 2015-03-06 DIAGNOSIS — K219 Gastro-esophageal reflux disease without esophagitis: Secondary | ICD-10-CM

## 2015-03-06 DIAGNOSIS — F419 Anxiety disorder, unspecified: Secondary | ICD-10-CM | POA: Diagnosis not present

## 2015-03-06 DIAGNOSIS — I10 Essential (primary) hypertension: Secondary | ICD-10-CM | POA: Diagnosis not present

## 2015-03-06 DIAGNOSIS — I482 Chronic atrial fibrillation, unspecified: Secondary | ICD-10-CM

## 2015-03-06 DIAGNOSIS — G4700 Insomnia, unspecified: Secondary | ICD-10-CM

## 2015-03-06 NOTE — Progress Notes (Signed)
Patient ID: Carrie Best, female   DOB: [birth year], 81 y.o.   MRN: [id number]   03/06/15  Facility:  Nursing Home Location:  [facility] Room Number: 805-2 LEVEL OF CARE:  SNF (31)    Chief Complaint  Patient presents with  . Discharge Note    Osteoarthritis S/P right total knee arthroplasty, hyperlipidemia, hypertension, atrial fibrillation, CHF, depression, anxiety, hypokalemia, constipation, GERD, insomnia and anemia    HISTORY OF PRESENT ILLNESS:  This is an 81 year old female who is for discharge home with Home health PT for endurance, OT for ADLs, Nursing for blood checks and medication management and CNA for showers. DME:  Bedside commode. She has been admitted to [facility] on 02/20/15 from [facility] with osteoarthritis S/P right total knee arthroplasty. She has PMH of hypertension, arthritis, hyperlipidemia, CHF, left CVA and dysrhythmia.  Patient was admitted to this facility for short-term rehabilitation after the patient's recent hospitalization.  Patient has completed SNF rehabilitation and therapy has cleared the patient for discharge.  PAST MEDICAL HISTORY:  Past Medical History  Diagnosis Date  . Hypertension   . Arthritis   . Diastolic dysfunction   . Hyperlipidemia   . Congestive heart failure     Congestive heart failure symptoms  . Heart murmur   . Dysrhythmia 05-10-12    hx. A. Fib., rate is controlled-tx. Xarelto  . Pneumonia 05-10-12    dx. in July- tx. antibiotics  . Hearing loss 05-10-12    bilateral hearing aids.  Marland Kitchen GERD (gastroesophageal reflux disease) 05-10-12    tx. with meds as needed  . Depression   . Stroke 05-10-12    Lt.CVA note on scans-hx. freq. falls/ none past 6 months 01/21/14  . Wears hearing aid     bilateral  . Dyspnea     occasionally, with exertion  . Anxiety   . History of kidney stones     CURRENT MEDICATIONS: Reviewed per MAR/see medication list  Allergies  Allergen Reactions  .  Codeine Nausea And Vomiting  . Penicillins Hives  . Sulfur Nausea And Vomiting     REVIEW OF SYSTEMS:  GENERAL: no change in appetite, no fatigue, no weight changes, no fever, chills or weakness RESPIRATORY: no cough, SOB, DOE, wheezing, hemoptysis CARDIAC: no chest pain, or palpitations GI: no abdominal pain, diarrhea, constipation, heart burn, nausea or vomiting  PHYSICAL EXAMINATION  GENERAL: no acute distress, normal body habitus SKIN:  Right knee surgical incision is dry, no erythema; covered with dry dressing NECK: supple, trachea midline, no neck masses, no thyroid tenderness, no thyromegaly LYMPHATICS: no LAN in the neck, no supraclavicular LAN RESPIRATORY: breathing is even & unlabored, BS CTAB CARDIAC: RRR, no murmur,no extra heart sounds, RLE edema 1+ GI: abdomen soft, normal BS, no masses, no tenderness, no hepatomegaly, no splenomegaly EXTREMITIES:  Able to move 4 extremities PSYCHIATRIC: the patient is alert & oriented to person, affect & behavior appropriate  LABS/RADIOLOGY: Labs reviewed: 03/04/15   WBC 6.5 hemoglobin 9.9 hematocrit 30.2 MCV 88.3 platelet 266 02/24/15   WBC 7.6 hemoglobin 8.9 hematocrit and to 6.2 MCV 86.2 platelet 265 sodium 136 potassium 4.2 glucose 102 BUN 19 creatinine 1.04 calcium 8.9 Basic Metabolic Panel:  Recent Labs  40/98/11 0218 02/07/15 1547 02/18/15 0520  NA 140 135 133*  K 3.5 3.9 3.8  CL 107 102 99*  CO2 25 26 27   GLUCOSE 128* 186* 233*  BUN 11 11 10   CREATININE 0.86 1.07* 0.98  CALCIUM 10.5* 9.7 8.8*  MG 2.0  --   --   PHOS 3.9  --   --    Liver Function Tests:  Recent Labs  01/28/15 0218  AST 19  ALT 11*  ALKPHOS 101  BILITOT 0.7  PROT 6.4*  ALBUMIN 3.3*   CBC:  Recent Labs  02/07/15 1547 02/18/15 0520 02/19/15 0550 02/20/15 0629  WBC 7.2 11.9* 10.8* 9.0  NEUTROABS 4.7  --   --   --   HGB 12.0 10.4* 9.4* 9.2*  HCT 36.1 31.7* 28.0* 27.1*  MCV 87.8 86.8 85.9 84.4  PLT 247 199 187 172    CBG:  Recent Labs  01/28/15 0047  GLUCAP 108*    Dg Chest 2 View  02/07/2015   CLINICAL DATA:  Preoperative examination prior to knee replacement ; history of CHF  EXAM: CHEST  2 VIEW  COMPARISON:  PA and lateral chest x-ray of Jan 27, 2015  FINDINGS: The lungs are mildly hyperinflated. There is no alveolar infiltrate. The interstitial markings have improved since the previous study. There is stable apical pleural thickening. The heart is top-normal in size. The pulmonary vascularity is mildly prominent centrally but more distinct today. The mediastinum is normal in width. The patient has undergone ORIF for a proximal right humeral fracture. There are old left-sided rib deformities.  IMPRESSION: Probable COPD. Improved appearance of the pulmonary interstitium consistent with resolution of interstitial edema. There is no acute cardiopulmonary abnormality.   Electronically Signed   By: David  [place] M.D.   On: 02/07/2015 15:57    ASSESSMENT/PLAN:  Osteoarthritis S/P right total knee arthroplasty - for home health PT, OT, nursing and CNA; follow-up with Dr. Turner Daniels, orthopedic surgeon; continue Mobic15 mg 1 tab by mouth daily , Percocet 5/325 mg 1 tab by mouth every 4 hours when necessary and tramadol 50 mg 1 tab by mouth every 6 hours when necessary for pain; Xarelto 10 mg 1 tab by mouth daily for DVT prophylaxis; and Zanaflex 2 mg 1 capsule by mouth 3 times a day when necessary for muscle spasm Hyperlipidemia - continue Lipitor 20 mg 1 tab by mouth daily Hypertension - continue diltiazem 180 mg 24-hour 1 capsule by mouth every morning, Lasix 40 mg 1 tab by mouth daily and Lopressor 50 mg 1 tab by mouth twice a day Atrial fibrillation - rate controlled; continue diltiazem 180 mg 24-hour 1 capsule by mouth every morning, Lopressor 50 mg 1 by mouth twice a day and Xarelto 10 mg 1 tab by mouth daily Chronic diastolic CHF - continue Lasix 40 mg 1 tab by mouth daily Depression - mood is stable; continue  Cymbalta 60 mg 1 tab by mouth every morning Anxiety - continue Ativan 0.5 mg 1 tab by mouth every 8 hours when necessary Hypokalemia - K4.2; continue KCl 20  MEQ 1 by mouth daily Constipation - continue senna S1 tab by mouth twice a day GERD - continue ranitidine 150 mg by mouth daily when necessary Insomnia - continue Ambien 10 mg 1 by mouth daily at bedtime when necessary Anemia, acute blood loss - hemoglobin 9.9; continue ferrous sulfate 325 mg by mouth twice a day    I have filled out patient's discharge paperwork and written prescriptions.  Patient will receive home health PT, OT, Nursing and CNA.  DME provided:  Bedside commode  Total discharge time: Greater than 30 minutes  Discharge time involved coordination of the discharge process with social worker, nursing staff and therapy department. Medical  justification for home health services/DME verified.   [facility] HospitalMEDINA-VARGAS,Crimson Dubberly, NP [facility] [phone]

## 2015-04-21 ENCOUNTER — Other Ambulatory Visit: Payer: Self-pay | Admitting: Adult Health

## 2015-05-02 ENCOUNTER — Other Ambulatory Visit: Payer: Self-pay | Admitting: Adult Health

## 2015-05-09 ENCOUNTER — Other Ambulatory Visit: Payer: Self-pay | Admitting: Adult Health

## 2015-05-23 ENCOUNTER — Other Ambulatory Visit: Payer: Self-pay | Admitting: Adult Health

## 2015-05-28 IMAGING — CT CT HEAD W/O CM
1 of 2 series · 16 of 30 positions shown, 20 images · non-contrast
Comparison: 01/31/2012

CLINICAL DATA: Altered mental status

EXAM:
CT HEAD WITHOUT CONTRAST
TECHNIQUE: Contiguous axial images were obtained from the base of the skull
through the vertex without intravenous contrast.

[Series 3: head 2.0 h70h · axial · 0.41mm/px · z∈[-112,+28]mm · 16 of 78 slices shown, 20 images]
[im 4/78  brain]
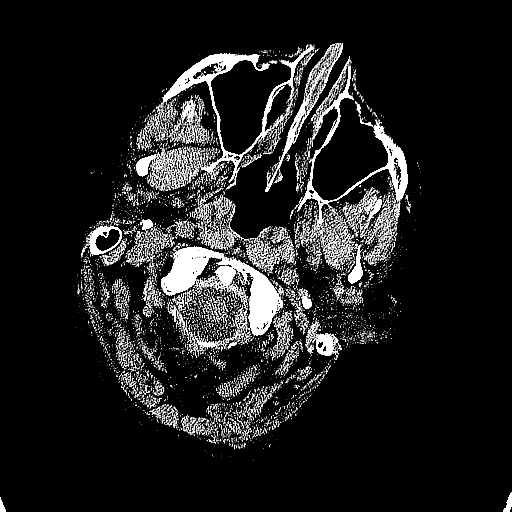
[im 4/78  bone]
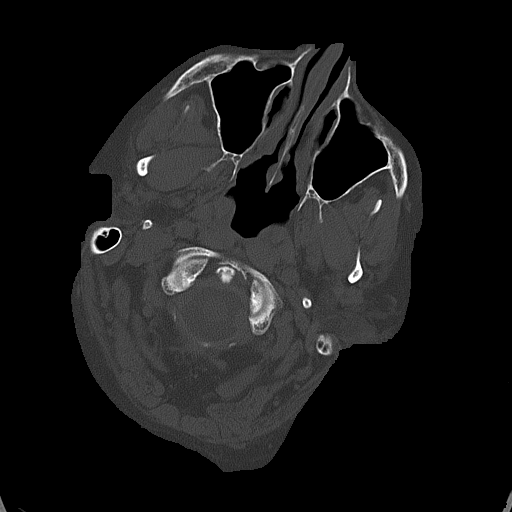
[im 8/78  brain]
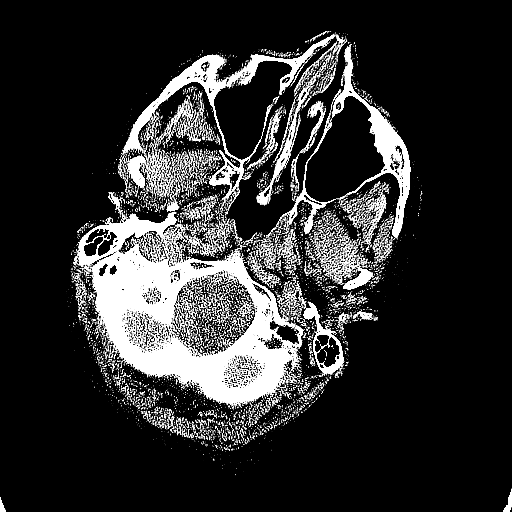
[im 12/78  brain]
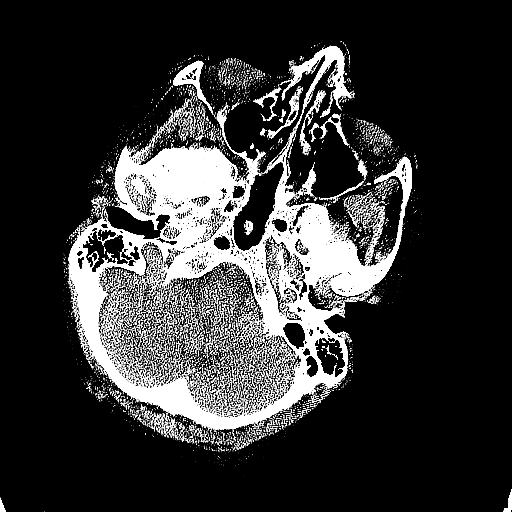
[im 20/78  brain]
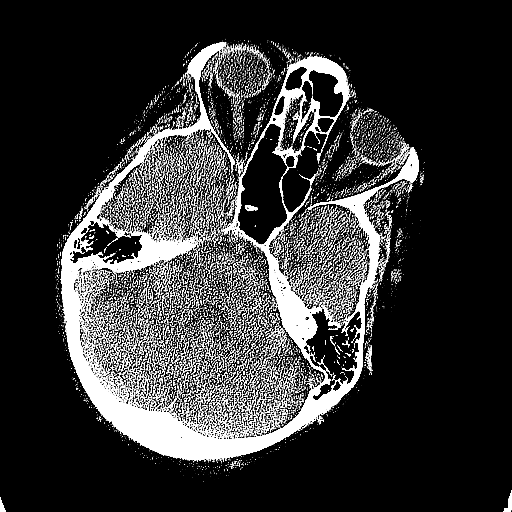
[im 24/78  brain]
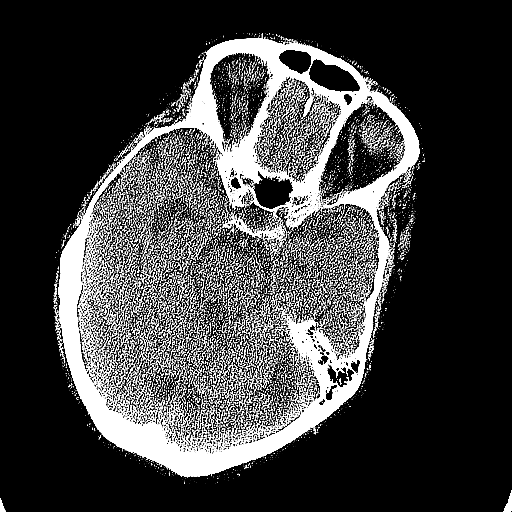
[im 24/78  bone]
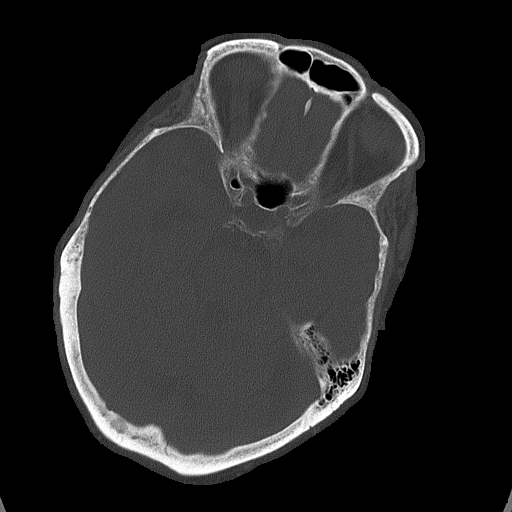
[im 27/78  brain]
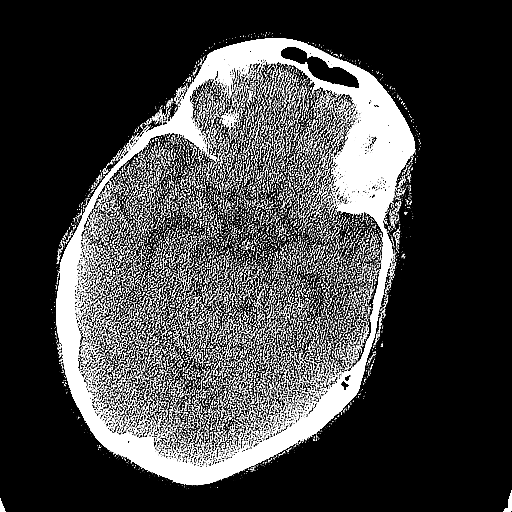
[im 31/78  brain]
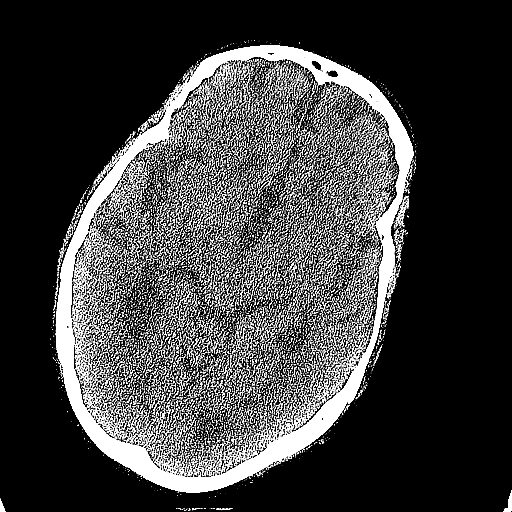
[im 35/78  brain]
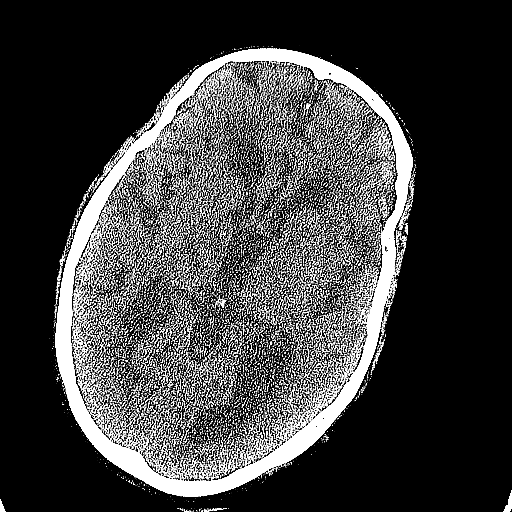
[im 43/78  brain]
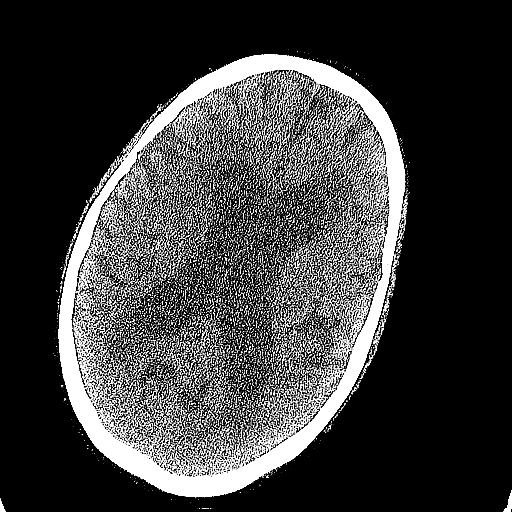
[im 43/78  bone]
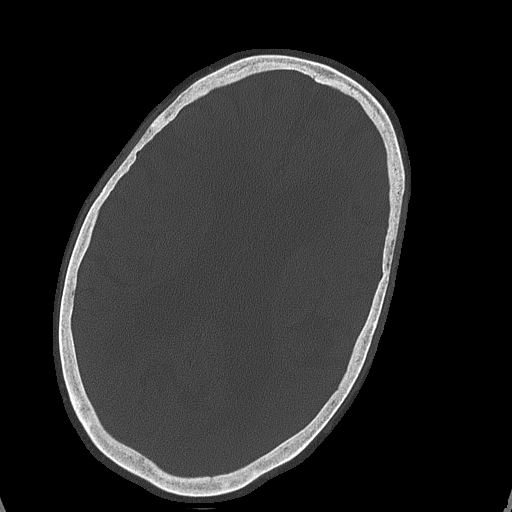
[im 47/78  brain]
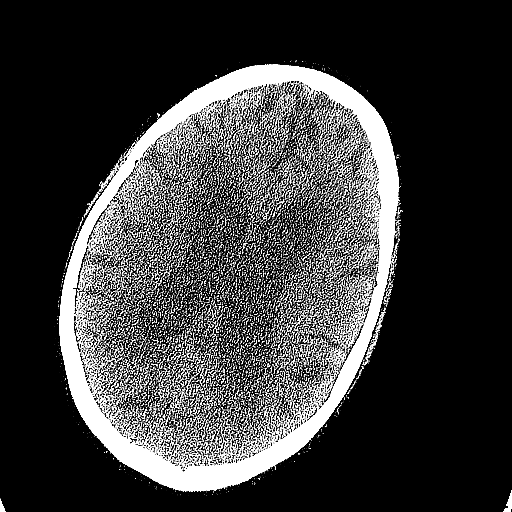
[im 51/78  brain]
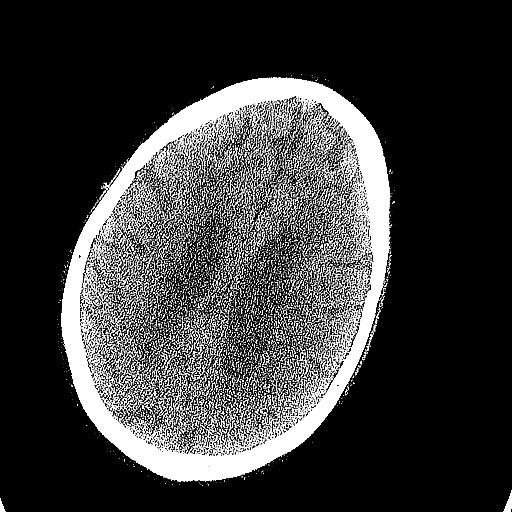
[im 54/78  brain]
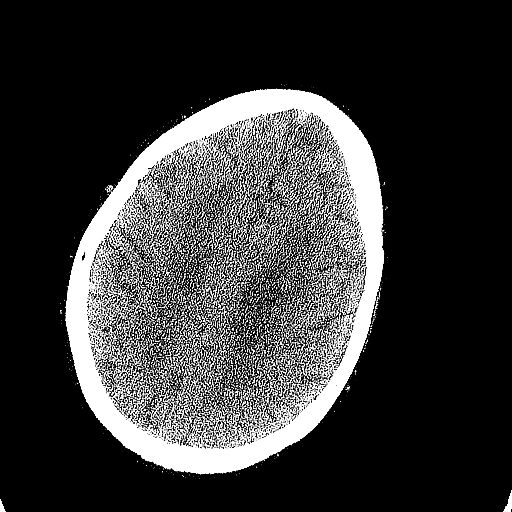
[im 58/78  brain]
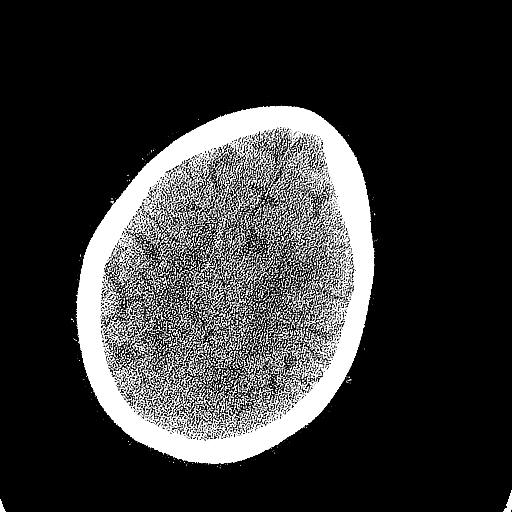
[im 58/78  bone]
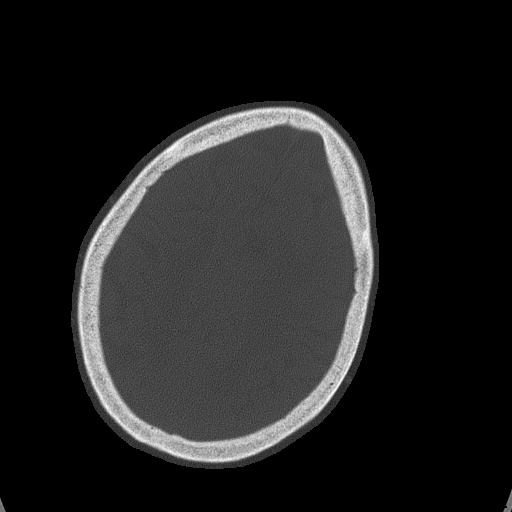
[im 66/78  brain]
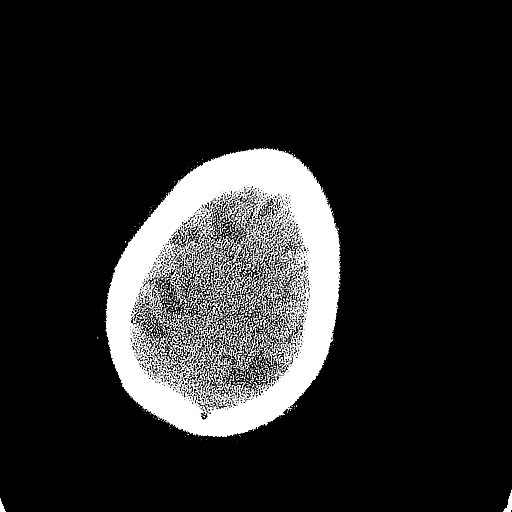
[im 70/78  brain]
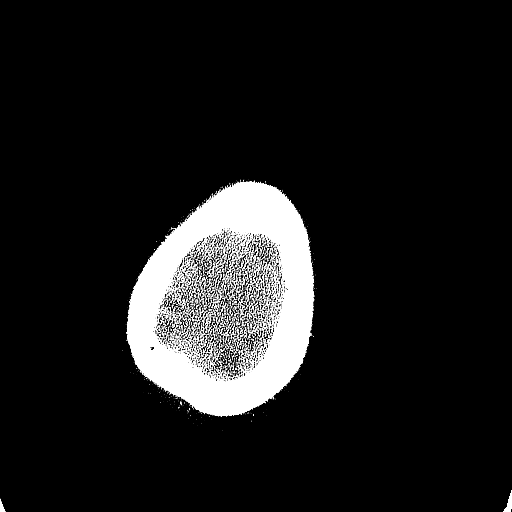
[im 74/78  brain]
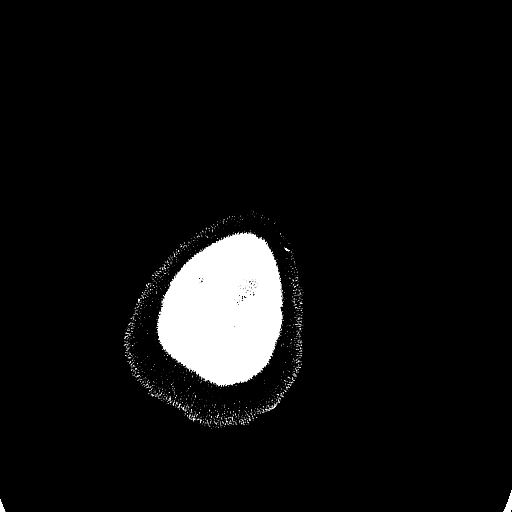

[16 of 30 positions shown; findings below may reference images not displayed]

FINDINGS: Skull and Sinuses:Negative for fracture or destructive process. The
mastoids, middle ears, and imaged paranasal sinuses are clear.

Orbits: Bilateral cataract resection.  No acute findings.

Brain: No evidence of acute infarction, hemorrhage, hydrocephalus,
or mass lesion/mass effect. There is generalized cerebral volume
loss which is typical for age and stable from 8481. Extensive
chronic small vessel disease with ischemic gliosis throughout the
bilateral cerebral white matter. Stable ex vacuo ventriculomegaly.
IMPRESSION: 1. No acute findings.
2. Extensive chronic small vessel disease, stable from 8481.

## 2015-07-23 ENCOUNTER — Telehealth: Payer: Self-pay | Admitting: Cardiovascular Disease

## 2015-07-23 NOTE — Telephone Encounter (Signed)
Returned call.  Carrie MaineGrandson was wanting to know what strength Xarelto the patient should be taking.  [facility] was confused because before a Oct total knee replacement surgery she was on 20mg  prescribed by her pcp.  Her hosp. discharge stated 10mg  and a 15 pill 10mg  script was given.  She has not been back to her PCP since surgery.  They had been getting refills of the 20mg  Xarelto issued by her pcp.  This is now running out and when the pharmacy contacted her pcp for refill, they were told to call her cardiologist. Julianne Handleralled [facility] talked to Onalee HuaDavid at [phone] to clarify when scripts and refills were done.  Do we want to refill?

## 2015-07-23 NOTE — Telephone Encounter (Signed)
She should continue Xarelto 20 mg a day

## 2015-07-23 NOTE — Telephone Encounter (Signed)
New problem   Pt's grandson need to know what mg of Xarelto the pt is suppose to be taking. Please advise.

## 2015-07-24 MED ORDER — RIVAROXABAN 20 MG PO TABS: 20.0000 mg | ORAL_TABLET | Freq: Every day | ORAL | Status: DC

## 2015-07-24 NOTE — Telephone Encounter (Signed)
Left message on patient's voice mail regarding patient is to take Xarelto 20 mg once daily.  I have updated the prescription.  I attempted to call later to discuss with patient and someone picked up the phone and hung up.

## 2015-08-07 ENCOUNTER — Emergency Department (HOSPITAL_COMMUNITY)
Admission: EM | Admit: 2015-08-07 | Discharge: 2015-08-08 | Disposition: A | Discharge status: Home / Self Care | Payer: Medicare Other | Attending: Emergency Medicine | Admitting: Emergency Medicine

## 2015-08-07 ENCOUNTER — Emergency Department (HOSPITAL_COMMUNITY): Payer: Medicare Other

## 2015-08-07 ENCOUNTER — Encounter (HOSPITAL_COMMUNITY): Payer: Self-pay | Admitting: Emergency Medicine

## 2015-08-07 DIAGNOSIS — I10 Essential (primary) hypertension: Secondary | ICD-10-CM | POA: Insufficient documentation

## 2015-08-07 DIAGNOSIS — Z7901 Long term (current) use of anticoagulants: Secondary | ICD-10-CM | POA: Insufficient documentation

## 2015-08-07 DIAGNOSIS — M1990 Unspecified osteoarthritis, unspecified site: Secondary | ICD-10-CM | POA: Insufficient documentation

## 2015-08-07 DIAGNOSIS — Z974 Presence of external hearing-aid: Secondary | ICD-10-CM | POA: Insufficient documentation

## 2015-08-07 DIAGNOSIS — E785 Hyperlipidemia, unspecified: Secondary | ICD-10-CM | POA: Insufficient documentation

## 2015-08-07 DIAGNOSIS — Z791 Long term (current) use of non-steroidal anti-inflammatories (NSAID): Secondary | ICD-10-CM | POA: Insufficient documentation

## 2015-08-07 DIAGNOSIS — Z87442 Personal history of urinary calculi: Secondary | ICD-10-CM | POA: Insufficient documentation

## 2015-08-07 DIAGNOSIS — Z79899 Other long term (current) drug therapy: Secondary | ICD-10-CM | POA: Insufficient documentation

## 2015-08-07 DIAGNOSIS — F419 Anxiety disorder, unspecified: Secondary | ICD-10-CM | POA: Insufficient documentation

## 2015-08-07 DIAGNOSIS — H9193 Unspecified hearing loss, bilateral: Secondary | ICD-10-CM | POA: Insufficient documentation

## 2015-08-07 DIAGNOSIS — F329 Major depressive disorder, single episode, unspecified: Secondary | ICD-10-CM | POA: Insufficient documentation

## 2015-08-07 DIAGNOSIS — R2240 Localized swelling, mass and lump, unspecified lower limb: Secondary | ICD-10-CM | POA: Insufficient documentation

## 2015-08-07 DIAGNOSIS — Z8673 Personal history of transient ischemic attack (TIA), and cerebral infarction without residual deficits: Secondary | ICD-10-CM | POA: Insufficient documentation

## 2015-08-07 DIAGNOSIS — I509 Heart failure, unspecified: Secondary | ICD-10-CM | POA: Insufficient documentation

## 2015-08-07 DIAGNOSIS — Z880 Allergy status to penicillin: Secondary | ICD-10-CM | POA: Insufficient documentation

## 2015-08-07 DIAGNOSIS — K219 Gastro-esophageal reflux disease without esophagitis: Secondary | ICD-10-CM | POA: Insufficient documentation

## 2015-08-07 DIAGNOSIS — Z8701 Personal history of pneumonia (recurrent): Secondary | ICD-10-CM | POA: Insufficient documentation

## 2015-08-07 DIAGNOSIS — R011 Cardiac murmur, unspecified: Secondary | ICD-10-CM | POA: Insufficient documentation

## 2015-08-07 LAB — CBC WITH DIFFERENTIAL/PLATELET
BASOS ABS: 0 10*3/uL (ref 0.0–0.1)
Basophils Relative: 0 %
EOS PCT: 1 %
Eosinophils Absolute: 0.1 10*3/uL (ref 0.0–0.7)
HCT: 34.6 % — ABNORMAL LOW (ref 36.0–46.0)
Hemoglobin: 11 g/dL — ABNORMAL LOW (ref 12.0–15.0)
LYMPHS PCT: 25 %
Lymphs Abs: 2.3 10*3/uL (ref 0.7–4.0)
MCH: 28.4 pg (ref 26.0–34.0)
MCHC: 31.8 g/dL (ref 30.0–36.0)
MCV: 89.2 fL (ref 78.0–100.0)
Monocytes Absolute: 0.7 10*3/uL (ref 0.1–1.0)
Monocytes Relative: 8 %
NEUTROS PCT: 66 %
Neutro Abs: 6.1 10*3/uL (ref 1.7–7.7)
PLATELETS: 228 10*3/uL (ref 150–400)
RBC: 3.88 MIL/uL (ref 3.87–5.11)
RDW: 14.7 % (ref 11.5–15.5)
WBC: 9.2 10*3/uL (ref 4.0–10.5)

## 2015-08-07 NOTE — ED Provider Notes (Signed)
CSN: [id number]     Arrival date & time 08/07/15  2056 History   First MD Initiated Contact with Patient 08/07/15 2143     Chief Complaint  Patient presents with  . Shortness of Breath     (Consider location/radiation/quality/duration/timing/severity/associated sxs/prior Treatment) HPI Comments: Shortness of breath x 3 days Worse with laying down, exertion (standing for transfers) Fall 3 mos ago No CP No Cough Mild-moderate symptoms   Patient is a 81 y.o. female presenting with shortness of breath.  Shortness of Breath Associated symptoms: no abdominal pain, no chest pain, no cough, no fever, no headaches, no neck pain, no rash, no sore throat and no vomiting     Past Medical History  Diagnosis Date  . Hypertension   . Arthritis   . Diastolic dysfunction   . Hyperlipidemia   . Congestive heart failure (HCC)     Congestive heart failure symptoms  . Heart murmur   . Dysrhythmia 05-10-12    hx. A. Fib., rate is controlled-tx. Xarelto  . Pneumonia 05-10-12    dx. in July- tx. antibiotics  . Hearing loss 05-10-12    bilateral hearing aids.  Marland Kitchen GERD (gastroesophageal reflux disease) 05-10-12    tx. with meds as needed  . Depression   . Stroke (HCC) 05-10-12    Lt.CVA note on scans-hx. freq. falls/ none past 6 months 01/21/14  . Wears hearing aid     bilateral  . Dyspnea     occasionally, with exertion  . Anxiety   . History of kidney stones    Past Surgical History  Procedure Laterality Date  . Appendectomy    . Other surgical history      Bladder Surgery  . Interstim implant placement  05-10-12    now malfunctioning  . Cataract extraction, bilateral  05-10-12    bilateral  . Interstim implant removal  05/16/2012    Procedure: REMOVAL OF INTERSTIM IMPLANT;  Surgeon: Martina Sinner, MD;  Location: [facility];  Service: Urology;  Laterality: N/A;  . Cystoscopy with retrograde pyelogram, ureteroscopy and stent placement Right 01/31/2014    Procedure: CYSTOSCOPY WITH bilateral   RETROGRADE PYELOGRAM, right URETEROSCOPY AND right  STENT PLACEMENT, bladder biopsy with fulgeration;  Surgeon: Sebastian Ache, MD;  Location: [facility];  Service: Urology;  Laterality: Right;  . Holmium laser application Right 01/31/2014    Procedure: HOLMIUM LASER APPLICATION;  Surgeon: Sebastian Ache, MD;  Location: [facility];  Service: Urology;  Laterality: Right;  . Humerus im nail Right 06/10/2014    Procedure: INTRAMEDULLARY (IM) NAIL HUMERAL;  Surgeon: Mable Paris, MD;  Location: [facility];  Service: Orthopedics;  Laterality: Right;  Right intramedullary humeral nail  . Hardware removal Right 09/09/2014    Procedure: HARDWARE REMOVAL RIGHT SHOULDER;  Surgeon: Mable Paris, MD;  Location: [facility];  Service: Orthopedics;  Laterality: Right;  Right shoulder hardware removal  . Total knee arthroplasty Right 02/17/2015    Procedure: TOTAL KNEE ARTHROPLASTY;  Surgeon: Gean Birchwood, MD;  Location: [facility];  Service: Orthopedics;  Laterality: Right;   Family History  Problem Relation Age of Onset  . Stroke Mother   . Stroke Brother    Social History  Substance Use Topics  . Smoking status: Never Smoker   . Smokeless tobacco: Never Used  . Alcohol Use: No   OB History    No data available     Review of Systems  Constitutional: Negative for fever.  HENT: Negative for sore  throat.   Eyes: Negative for visual disturbance.  Respiratory: Positive for shortness of breath. Negative for cough.   Cardiovascular: Positive for leg swelling. Negative for chest pain.  Gastrointestinal: Negative for nausea, vomiting, abdominal pain, diarrhea and constipation.  Genitourinary: Negative for difficulty urinating.  Musculoskeletal: Negative for back pain and neck pain.  Skin: Negative for rash.  Neurological: Negative for syncope and headaches.      Allergies  Codeine; Penicillins; and Sulfur  Home Medications   Prior to Admission medications   Medication Sig Start  Date End Date Taking? Authorizing Provider  atorvastatin (LIPITOR) 20 MG tablet Take 20 mg by mouth daily. 01/06/15  Yes Historical Provider, MD  diltiazem (DILACOR XR) 180 MG 24 hr capsule Take 180 mg by mouth every morning.   Yes Historical Provider, MD  DULoxetine (CYMBALTA) 60 MG capsule Take 60 mg by mouth every morning.    Yes Historical Provider, MD  furosemide (LASIX) 40 MG tablet TAKE 1 TABLET BY MOUTH EVERY DAY 02/10/15  Yes Vesta Mixer, MD  HYDROcodone-acetaminophen (NORCO/VICODIN) 5-325 MG tablet Take 1 tablet by mouth every 6 (six) hours as needed for moderate pain.   Yes Historical Provider, MD  LORazepam (ATIVAN) 0.5 MG tablet Take 0.5 mg by mouth every 8 (eight) hours as needed for anxiety.   Yes Historical Provider, MD  meloxicam (MOBIC) 15 MG tablet Take 15 mg by mouth daily. 01/06/15  Yes Historical Provider, MD  metoprolol tartrate (LOPRESSOR) 25 MG tablet Take 50 mg by mouth 2 (two) times daily.  03/23/12  Yes Kirby Funk, MD  oxyCODONE-acetaminophen (ROXICET) 5-325 MG per tablet Take 1 tablet by mouth every 4 (four) hours as needed for severe pain. 02/18/15  Yes Gean Birchwood, MD  potassium chloride SA (K-DUR,KLOR-CON) 20 MEQ tablet Take 1 tablet (20 mEq total) by mouth daily. 01/28/15 04/06/17 Yes Zannie Cove, MD  ranitidine (ZANTAC) 150 MG tablet Take 150 mg by mouth daily as needed for heartburn.   Yes Historical Provider, MD  rivaroxaban (XARELTO) 20 MG TABS tablet Take 1 tablet (20 mg total) by mouth daily with breakfast. 07/24/15  Yes Vesta Mixer, MD  tizanidine (ZANAFLEX) 2 MG capsule Take 1 capsule (2 mg total) by mouth 3 (three) times daily as needed for muscle spasms. 02/18/15  Yes Gean Birchwood, MD  traMADol (ULTRAM) 50 MG tablet Take 1 tablet (50 mg total) by mouth every 6 (six) hours as needed for moderate pain. Post-operatively 01/31/14  Yes Sebastian Ache, MD  zolpidem (AMBIEN) 10 MG tablet Take 10 mg by mouth at bedtime as needed. For sleep   Yes Historical Provider,  MD  senna-docusate (SENOKOT-S) 8.6-50 MG per tablet Take 1 tablet by mouth 2 (two) times daily. While taking pain meds to prevent constipation Patient not taking: Reported on 08/07/2015 01/31/14   Sebastian Ache, MD   BP 181/88 mmHg  Pulse 107  Temp(Src) 98 F (36.7 C) (Oral)  Resp 23  Ht  (1.727 m)  Wt 180 lb (81.647 kg)  BMI 27.38 kg/m2  SpO2 96% Physical Exam  Constitutional: She is oriented to person, place, and time. She appears well-developed and well-nourished. No distress.  HENT:  Head: Normocephalic and atraumatic.  Eyes: Conjunctivae and EOM are normal.  Neck: Normal range of motion. No JVD present.  Cardiovascular: Normal rate, regular rhythm, normal heart sounds and intact distal pulses.  Exam reveals no gallop and no friction rub.   No murmur heard. Pulmonary/Chest: Effort normal and breath sounds normal. No  respiratory distress. She has no wheezes. She has no rales.  Abdominal: Soft. She exhibits no distension. There is no tenderness. There is no guarding.  Musculoskeletal: She exhibits edema (1+ bilaterally). She exhibits no tenderness.  Neurological: She is alert and oriented to person, place, and time.  Skin: Skin is warm and dry. No rash noted. She is not diaphoretic. No erythema.  Nursing note and vitals reviewed.   ED Course  Procedures (including critical care time) Labs Review Labs Reviewed  COMPREHENSIVE METABOLIC PANEL - Abnormal; Notable for the following:    Potassium 3.2 (*)    Glucose, Bld 115 (*)    ALT 12 (*)    All other components within normal limits  BRAIN NATRIURETIC PEPTIDE - Abnormal; Notable for the following:    B Natriuretic Peptide 466.2 (*)    All other components within normal limits  CBC WITH DIFFERENTIAL/PLATELET - Abnormal; Notable for the following:    Hemoglobin 11.0 (*)    HCT 34.6 (*)    All other components within normal limits  I-STAT TROPOININ, ED    Imaging Review Dg Chest 2 View  08/07/2015  CLINICAL DATA:   Dyspnea for 3 days. EXAM: CHEST  2 VIEW COMPARISON:  02/07/2015 FINDINGS: There is unchanged mild cardiomegaly. Slight vascular and interstitial prominence is present, and there probably are small effusions in the posterior costophrenic angles bilaterally. No alveolar edema or infiltrate. IMPRESSION: Small pleural effusions. There probably is a degree of congestive heart failure, with prominent vasculature and interstitium. Electronically Signed   By: Ellery Plunk M.D.   On: 08/07/2015 21:46   I have personally reviewed and evaluated these images and lab results as part of my medical decision-making.   EKG Interpretation   Date/Time:  Thursday August 07 2015 21:09:42 EST Ventricular Rate:  89 PR Interval:    QRS Duration: 136 QT Interval:  402 QTC Calculation: 489 R Axis:   -62 Text Interpretation:  Atrial fibrillation Left bundle branch block No  significant change since last tracing Confirmed by [facility] MD, Nilah Belcourt  ([id number]) on 08/08/2015 2:37:01 AM      MDM   Final diagnoses:  Acute congestive heart failure, unspecified congestive heart failure type [facility]), mild    81 year old female with a history of diastolic CHF (per chart, pt denies), htn, hlpd, atrial fibrillation on xarelto, who presents with concern of 3 days of shortness of breath.  Differential diagnosis for dyspnea includes ACS, PE, COPD exacerbation, CHF exacerbation, anemia, pneumonia.  Chest x-ray was done which showed bilateral pleural effusions, prominent interstitium with concern for mild CHF. Marland Kitchen EKG was evaluated by me which showed atrial fibrillation with LBBB, similar to prior .  BNP was elevated to 466 with unknown prior.  Denies CP, and given continuous symptoms with normal troponin, doubt ACS.  No sign of significant anemia.  No sign of pneumonia.  Doubt PE given pt on xarelto and has other possible etiologies of SOB.  Shortness of breath likely secondary to mild CHF exacerbation by hx, physical, and laboratory  data.  Patient with normal O2 saturation on room air, no significant tachypnea.  Atrial fibrillation is rate controlled around 100 and below.  Given  IV lasix and po potassium for K of 3.2.   Patient has PCP, family support, is making urine following lasix and feels symptoms are improved.  She is appropriate for outpt management of CHF symptoms, and return to ED if symptoms worsen.  Pt and grandson state understanding.  Instructed to take  home dose of lasix 40mg  BID tomorrow and increase K for next 3 days to 40meq per day. Htn to 200s systolic after symptoms improved and doubt htn emergency. Given hydralazine with improvement. Patient discharged in stable condition with understanding of reasons to return and will follow up with PCP tomorrow or early next week.   Alvira MondayErin Noboru Bidinger, MD 08/08/15 [phone]
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
## 2015-08-08 LAB — COMPREHENSIVE METABOLIC PANEL
ALT: 12 U/L — AB (ref 14–54)
AST: 20 U/L (ref 15–41)
Albumin: 3.9 g/dL (ref 3.5–5.0)
Alkaline Phosphatase: 126 U/L (ref 38–126)
Anion gap: 9 (ref 5–15)
BUN: 11 mg/dL (ref 6–20)
CO2: 28 mmol/L (ref 22–32)
CREATININE: 0.84 mg/dL (ref 0.44–1.00)
Calcium: 9.8 mg/dL (ref 8.9–10.3)
Chloride: 103 mmol/L (ref 101–111)
GFR calc Af Amer: >60 mL/min (ref >60)
Glucose, Bld: 115 mg/dL — ABNORMAL HIGH (ref 65–99)
Potassium: 3.2 mmol/L — ABNORMAL LOW (ref 3.5–5.1)
Sodium: 140 mmol/L (ref 135–145)
TOTAL PROTEIN: 7.3 g/dL (ref 6.5–8.1)
Total Bilirubin: 1 mg/dL (ref 0.3–1.2)

## 2015-08-08 LAB — I-STAT TROPONIN, ED: Troponin i, poc: 0.01 ng/mL (ref 0.00–0.08)

## 2015-08-08 LAB — BRAIN NATRIURETIC PEPTIDE: B Natriuretic Peptide: 466.2 pg/mL — ABNORMAL HIGH (ref 0.0–100.0)

## 2015-08-08 MED ORDER — HYDRALAZINE HCL 20 MG/ML IJ SOLN
10.0000 mg | Freq: Once | INTRAMUSCULAR | Status: AC
  Administered 2015-08-08: 10 mg via INTRAVENOUS
  Filled 2015-08-08: qty 1

## 2015-08-08 MED ORDER — FUROSEMIDE 10 MG/ML IJ SOLN
40.0000 mg | Freq: Once | INTRAMUSCULAR | Status: AC
  Administered 2015-08-08: 40 mg via INTRAVENOUS
  Filled 2015-08-08: qty 4

## 2015-08-08 MED ORDER — POTASSIUM CHLORIDE CRYS ER 20 MEQ PO TBCR
40.0000 meq | EXTENDED_RELEASE_TABLET | Freq: Once | ORAL | Status: AC
  Administered 2015-08-08: 40 meq via ORAL
  Filled 2015-08-08: qty 2

## 2015-08-08 NOTE — Discharge Instructions (Signed)
Take 2 tablets of potassium for next 3 days (40meq per day for 3 days) and follow up potassium levels with your doctor (level 3.2 in Emergency department) Tomorrow, take 1 dose of lasix 40mg  in the morning, and one dose of 40mg  in the evening, then resume regular dose   Heart Failure Heart failure is a condition in which the heart has trouble pumping blood. This means your heart does not pump blood efficiently for your body to work well. In some cases of heart failure, fluid may back up into your lungs or you may have swelling (edema) in your lower legs. Heart failure is usually a long-term (chronic) condition. It is important for you to take good care of yourself and follow your health care provider's treatment plan. CAUSES  Some health conditions can cause heart failure. Those health conditions include:  High blood pressure (hypertension). Hypertension causes the heart muscle to work harder than normal. When pressure in the blood vessels is high, the heart needs to pump (contract) with more force in order to circulate blood throughout the body. High blood pressure eventually causes the heart to become stiff and weak.  Coronary artery disease (CAD). CAD is the buildup of cholesterol and fat (plaque) in the arteries of the heart. The blockage in the arteries deprives the heart muscle of oxygen and blood. This can cause chest pain and may lead to a heart attack. High blood pressure can also contribute to CAD.  Heart attack (myocardial infarction). A heart attack occurs when one or more arteries in the heart become blocked. The loss of oxygen damages the muscle tissue of the heart. When this happens, part of the heart muscle dies. The injured tissue does not contract as well and weakens the heart's ability to pump blood.  Abnormal heart valves. When the heart valves do not open and close properly, it can cause heart failure. This makes the heart muscle pump harder to keep the blood flowing.  Heart  muscle disease (cardiomyopathy or myocarditis). Heart muscle disease is damage to the heart muscle from a variety of causes. These can include drug or alcohol abuse, infections, or unknown reasons. These can increase the risk of heart failure.  Lung disease. Lung disease makes the heart work harder because the lungs do not work properly. This can cause a strain on the heart, leading it to fail.  Diabetes. Diabetes increases the risk of heart failure. High blood sugar contributes to high fat (lipid) levels in the blood. Diabetes can also cause slow damage to tiny blood vessels that carry important nutrients to the heart muscle. When the heart does not get enough oxygen and food, it can cause the heart to become weak and stiff. This leads to a heart that does not contract efficiently.  Other conditions can contribute to heart failure. These include abnormal heart rhythms, thyroid problems, and low blood counts (anemia). Certain unhealthy behaviors can increase the risk of heart failure, including:  Being overweight.  Smoking or chewing tobacco.  Eating foods high in fat and cholesterol.  Abusing illicit drugs or alcohol.  Lacking physical activity. SYMPTOMS  Heart failure symptoms may vary and can be hard to detect. Symptoms may include:  Shortness of breath with activity, such as climbing stairs.  Persistent cough.  Swelling of the feet, ankles, legs, or abdomen.  Unexplained weight gain.  Difficulty breathing when lying flat (orthopnea).  Waking from sleep because of the need to sit up and get more air.  Rapid heartbeat.  Fatigue and loss of energy.  Feeling light-headed, dizzy, or close to fainting.  Loss of appetite.  Nausea.  Increased urination during the night (nocturia). DIAGNOSIS  A diagnosis of heart failure is based on your history, symptoms, physical examination, and diagnostic tests. Diagnostic tests for heart failure may  include:  Echocardiography.  Electrocardiography.  Chest X-ray.  Blood tests.  Exercise stress test.  Cardiac angiography.  Radionuclide scans. TREATMENT  Treatment is aimed at managing the symptoms of heart failure. Medicines, behavioral changes, or surgical intervention may be necessary to treat heart failure.  Medicines to help treat heart failure may include:  Angiotensin-converting enzyme (ACE) inhibitors. This type of medicine blocks the effects of a blood protein called angiotensin-converting enzyme. ACE inhibitors relax (dilate) the blood vessels and help lower blood pressure.  Angiotensin receptor blockers (ARBs). This type of medicine blocks the actions of a blood protein called angiotensin. Angiotensin receptor blockers dilate the blood vessels and help lower blood pressure.  Water pills (diuretics). Diuretics cause the kidneys to remove salt and water from the blood. The extra fluid is removed through urination. This loss of extra fluid lowers the volume of blood the heart pumps.  Beta blockers. These prevent the heart from beating too fast and improve heart muscle strength.  Digitalis. This increases the force of the heartbeat.  Healthy behavior changes include:  Obtaining and maintaining a healthy weight.  Stopping smoking or chewing tobacco.  Eating heart-healthy foods.  Limiting or avoiding alcohol.  Stopping illicit drug use.  Physical activity as directed by your health care provider.  Surgical treatment for heart failure may include:  A procedure to open blocked arteries, repair damaged heart valves, or remove damaged heart muscle tissue.  A pacemaker to improve heart muscle function and control certain abnormal heart rhythms.  An internal cardioverter defibrillator to treat certain serious abnormal heart rhythms.  A left ventricular assist device (LVAD) to assist the pumping ability of the heart. HOME CARE INSTRUCTIONS   Take medicines only  as directed by your health care provider. Medicines are important in reducing the workload of your heart, slowing the progression of heart failure, and improving your symptoms.  Do not stop taking your medicine unless directed by your health care provider.  Do not skip any dose of medicine.  Refill your prescriptions before you run out of medicine. Your medicines are needed every day.  Engage in moderate physical activity if directed by your health care provider. Moderate physical activity can benefit some people. The elderly and people with severe heart failure should consult with a health care provider for physical activity recommendations.  Eat heart-healthy foods. Food choices should be free of trans fat and low in saturated fat, cholesterol, and salt (sodium). Healthy choices include fresh or frozen fruits and vegetables, fish, lean meats, legumes, fat-free or low-fat dairy products, and whole grain or high fiber foods. Talk to a dietitian to learn more about heart-healthy foods.  Limit sodium if directed by your health care provider. Sodium restriction may reduce symptoms of heart failure in some people. Talk to a dietitian to learn more about heart-healthy seasonings.  Use healthy cooking methods. Healthy cooking methods include roasting, grilling, broiling, baking, poaching, steaming, or stir-frying. Talk to a dietitian to learn more about healthy cooking methods.  Limit fluids if directed by your health care provider. Fluid restriction may reduce symptoms of heart failure in some people.  Weigh yourself every day. Daily weights are important in the early recognition of excess  fluid. You should weigh yourself every morning after you urinate and before you eat breakfast. Wear the same amount of clothing each time you weigh yourself. Record your daily weight. Provide your health care provider with your weight record.  Monitor and record your blood pressure if directed by your health care  provider.  Check your pulse if directed by your health care provider.  Lose weight if directed by your health care provider. Weight loss may reduce symptoms of heart failure in some people.  Stop smoking or chewing tobacco. Nicotine makes your heart work harder by causing your blood vessels to constrict. Do not use nicotine gum or patches before talking to your health care provider.  Keep all follow-up visits as directed by your health care provider. This is important.  Limit alcohol intake to no more than 1 drink per day for nonpregnant women and 2 drinks per day for men. One drink equals 12 ounces of beer, 5 ounces of wine, or 1 ounces of hard liquor. Drinking more than that is harmful to your heart. Tell your health care provider if you drink alcohol several times a week. Talk with your health care provider about whether alcohol is safe for you. If your heart has already been damaged by alcohol or you have severe heart failure, drinking alcohol should be stopped completely.  Stop illicit drug use.  Stay up-to-date with immunizations. It is especially important to prevent respiratory infections through current pneumococcal and influenza immunizations.  Manage other health conditions such as hypertension, diabetes, thyroid disease, or abnormal heart rhythms as directed by your health care provider.  Learn to manage stress.  Plan rest periods when fatigued.  Learn strategies to manage high temperatures. If the weather is extremely hot:  Avoid vigorous physical activity.  Use air conditioning or fans or seek a cooler location.  Avoid caffeine and alcohol.  Wear loose-fitting, lightweight, and light-colored clothing.  Learn strategies to manage cold temperatures. If the weather is extremely cold:  Avoid vigorous physical activity.  Layer clothes.  Wear mittens or gloves, a hat, and a scarf when going outside.  Avoid alcohol.  Obtain ongoing education and support as  needed.  Participate in or seek rehabilitation as needed to maintain or improve independence and quality of life. SEEK MEDICAL CARE IF:   You have a rapid weight gain.  You have increasing shortness of breath that is unusual for you.  You are unable to participate in your usual physical activities.  You tire easily.  You cough more than normal, especially with physical activity.  You have any or more swelling in areas such as your hands, feet, ankles, or abdomen.  You are unable to sleep because it is hard to breathe.  You feel like your heart is beating fast (palpitations).  You become dizzy or light-headed upon standing up. SEEK IMMEDIATE MEDICAL CARE IF:   You have difficulty breathing.  There is a change in mental status such as decreased alertness or difficulty with concentration.  You have a pain or discomfort in your chest.  You have an episode of fainting (syncope). MAKE SURE YOU:   Understand these instructions.  Will watch your condition.  Will get help right away if you are not doing well or get worse.   This information is not intended to replace advice given to you by your health care provider. Make sure you discuss any questions you have with your health care provider.   Document Released: 08/23/2005 Document Revised: 01/07/2015 Document  Reviewed: 09/22/2012 [employer] Interactive Patient Education [employer]

## 2015-10-06 ENCOUNTER — Emergency Department (HOSPITAL_COMMUNITY): Payer: Medicare Other

## 2015-10-06 ENCOUNTER — Encounter (HOSPITAL_COMMUNITY): Payer: Self-pay | Admitting: Emergency Medicine

## 2015-10-06 ENCOUNTER — Emergency Department (HOSPITAL_COMMUNITY)
Admission: EM | Admit: 2015-10-06 | Discharge: 2015-10-06 | Disposition: A | Discharge status: Home / Self Care | Payer: Medicare Other | Attending: Emergency Medicine | Admitting: Emergency Medicine

## 2015-10-06 DIAGNOSIS — K219 Gastro-esophageal reflux disease without esophagitis: Secondary | ICD-10-CM | POA: Insufficient documentation

## 2015-10-06 DIAGNOSIS — Z791 Long term (current) use of non-steroidal anti-inflammatories (NSAID): Secondary | ICD-10-CM | POA: Diagnosis not present

## 2015-10-06 DIAGNOSIS — Z8701 Personal history of pneumonia (recurrent): Secondary | ICD-10-CM | POA: Insufficient documentation

## 2015-10-06 DIAGNOSIS — R509 Fever, unspecified: Secondary | ICD-10-CM | POA: Diagnosis not present

## 2015-10-06 DIAGNOSIS — Z79899 Other long term (current) drug therapy: Secondary | ICD-10-CM | POA: Diagnosis not present

## 2015-10-06 DIAGNOSIS — I10 Essential (primary) hypertension: Secondary | ICD-10-CM | POA: Insufficient documentation

## 2015-10-06 DIAGNOSIS — H9190 Unspecified hearing loss, unspecified ear: Secondary | ICD-10-CM | POA: Insufficient documentation

## 2015-10-06 DIAGNOSIS — Z880 Allergy status to penicillin: Secondary | ICD-10-CM | POA: Diagnosis not present

## 2015-10-06 DIAGNOSIS — Z87442 Personal history of urinary calculi: Secondary | ICD-10-CM | POA: Insufficient documentation

## 2015-10-06 DIAGNOSIS — F419 Anxiety disorder, unspecified: Secondary | ICD-10-CM | POA: Diagnosis not present

## 2015-10-06 DIAGNOSIS — R059 Cough, unspecified: Secondary | ICD-10-CM

## 2015-10-06 DIAGNOSIS — Z7901 Long term (current) use of anticoagulants: Secondary | ICD-10-CM | POA: Diagnosis not present

## 2015-10-06 DIAGNOSIS — R05 Cough: Secondary | ICD-10-CM | POA: Diagnosis not present

## 2015-10-06 DIAGNOSIS — E785 Hyperlipidemia, unspecified: Secondary | ICD-10-CM | POA: Insufficient documentation

## 2015-10-06 DIAGNOSIS — R011 Cardiac murmur, unspecified: Secondary | ICD-10-CM | POA: Diagnosis not present

## 2015-10-06 DIAGNOSIS — I4891 Unspecified atrial fibrillation: Secondary | ICD-10-CM | POA: Diagnosis not present

## 2015-10-06 DIAGNOSIS — I509 Heart failure, unspecified: Secondary | ICD-10-CM | POA: Insufficient documentation

## 2015-10-06 DIAGNOSIS — R0602 Shortness of breath: Secondary | ICD-10-CM | POA: Insufficient documentation

## 2015-10-06 DIAGNOSIS — Z8673 Personal history of transient ischemic attack (TIA), and cerebral infarction without residual deficits: Secondary | ICD-10-CM | POA: Insufficient documentation

## 2015-10-06 DIAGNOSIS — M1990 Unspecified osteoarthritis, unspecified site: Secondary | ICD-10-CM | POA: Diagnosis not present

## 2015-10-06 DIAGNOSIS — R5383 Other fatigue: Secondary | ICD-10-CM | POA: Diagnosis not present

## 2015-10-06 LAB — I-STAT TROPONIN, ED: TROPONIN I, POC: 0 ng/mL (ref 0.00–0.08)

## 2015-10-06 LAB — I-STAT CG4 LACTIC ACID, ED
Lactic Acid, Venous: 1.29 mmol/L (ref 0.5–2.0)
Lactic Acid, Venous: 1.02 mmol/L (ref 0.5–2.0)

## 2015-10-06 LAB — CBC WITH DIFFERENTIAL/PLATELET
Basophils Absolute: 0 10*3/uL (ref 0.0–0.1)
Basophils Relative: 1 %
Eosinophils Absolute: 0.1 10*3/uL (ref 0.0–0.7)
Eosinophils Relative: 1 %
HEMATOCRIT: 34 % — AB (ref 36.0–46.0)
HEMOGLOBIN: 10.6 g/dL — AB (ref 12.0–15.0)
LYMPHS ABS: 1.4 10*3/uL (ref 0.7–4.0)
Lymphocytes Relative: 25 %
MCH: 26.2 pg (ref 26.0–34.0)
MCHC: 31.2 g/dL (ref 30.0–36.0)
MCV: 84 fL (ref 78.0–100.0)
MONOS PCT: 9 %
Monocytes Absolute: 0.5 10*3/uL (ref 0.1–1.0)
NEUTROS ABS: 3.5 10*3/uL (ref 1.7–7.7)
NEUTROS PCT: 64 %
Platelets: 200 10*3/uL (ref 150–400)
RBC: 4.05 MIL/uL (ref 3.87–5.11)
RDW: 15.2 % (ref 11.5–15.5)
WBC: 5.5 10*3/uL (ref 4.0–10.5)

## 2015-10-06 LAB — COMPREHENSIVE METABOLIC PANEL
ALBUMIN: 3.6 g/dL (ref 3.5–5.0)
ALK PHOS: 112 U/L (ref 38–126)
ALT: 10 U/L — AB (ref 14–54)
ANION GAP: 11 (ref 5–15)
AST: 18 U/L (ref 15–41)
BILIRUBIN TOTAL: 0.5 mg/dL (ref 0.3–1.2)
BUN: 9 mg/dL (ref 6–20)
CALCIUM: 9.4 mg/dL (ref 8.9–10.3)
CO2: 27 mmol/L (ref 22–32)
CREATININE: 1.07 mg/dL — AB (ref 0.44–1.00)
Chloride: 100 mmol/L — ABNORMAL LOW (ref 101–111)
GFR calc non Af Amer: 46 mL/min — ABNORMAL LOW (ref >60)
GFR, EST AFRICAN AMERICAN: 53 mL/min — AB (ref >60)
GLUCOSE: 122 mg/dL — AB (ref 65–99)
Potassium: 3.9 mmol/L (ref 3.5–5.1)
Sodium: 138 mmol/L (ref 135–145)
TOTAL PROTEIN: 6.9 g/dL (ref 6.5–8.1)

## 2015-10-06 MED ORDER — ALBUTEROL SULFATE (2.5 MG/3ML) 0.083% IN NEBU
5.0000 mg | INHALATION_SOLUTION | Freq: Once | RESPIRATORY_TRACT | Status: AC
  Administered 2015-10-06: 5 mg via RESPIRATORY_TRACT
  Filled 2015-10-06: qty 6

## 2015-10-06 MED ORDER — IPRATROPIUM BROMIDE 0.02 % IN SOLN
0.5000 mg | Freq: Once | RESPIRATORY_TRACT | Status: AC
  Administered 2015-10-06: 0.5 mg via RESPIRATORY_TRACT
  Filled 2015-10-06: qty 2.5

## 2015-10-06 MED ORDER — AZITHROMYCIN 250 MG PO TABS: 250.0000 mg | ORAL_TABLET | Freq: Every day | ORAL | Status: DC

## 2015-10-06 NOTE — ED Provider Notes (Signed)
CSN: [id number]     Arrival date & time 10/06/15  1359 History   First MD Initiated Contact with Patient 10/06/15 1756     Chief Complaint  Patient presents with  . Cough    HPI   Carrie Best is a 82 y.o. female with a PMH of HTN, HLD, CHF, CVA, atrial fibrillation on xarelto who presents to the ED with nasal congestion and cough productive of yellow sputum for the past week and a half. She reports her symptoms are constant. She denies exacerbating factors. She has tried robitussin with minimal symptom relief. She notes associated subjective fever, generalized fatigue, and shortness of breath. She denies sore throat, chest pain, abdominal pain, N/V, numbness, weakness, paresthesia. She states she has not been around anyone with similar symptoms.   Past Medical History  Diagnosis Date  . Hypertension   . Arthritis   . Diastolic dysfunction   . Hyperlipidemia   . Congestive heart failure (HCC)     Congestive heart failure symptoms  . Heart murmur   . Dysrhythmia 05-10-12    hx. A. Fib., rate is controlled-tx. Xarelto  . Pneumonia 05-10-12    dx. in July- tx. antibiotics  . Hearing loss 05-10-12    bilateral hearing aids.  Marland Kitchen GERD (gastroesophageal reflux disease) 05-10-12    tx. with meds as needed  . Depression   . Stroke (HCC) 05-10-12    Lt.CVA note on scans-hx. freq. falls/ none past 6 months 01/21/14  . Wears hearing aid     bilateral  . Dyspnea     occasionally, with exertion  . Anxiety   . History of kidney stones    Past Surgical History  Procedure Laterality Date  . Appendectomy    . Other surgical history      Bladder Surgery  . Interstim implant placement  05-10-12    now malfunctioning  . Cataract extraction, bilateral  05-10-12    bilateral  . Interstim implant removal  05/16/2012    Procedure: REMOVAL OF INTERSTIM IMPLANT;  Surgeon: Martina Sinner, MD;  Location: [facility];  Service: Urology;  Laterality: N/A;  . Cystoscopy with retrograde pyelogram, ureteroscopy  and stent placement Right 01/31/2014    Procedure: CYSTOSCOPY WITH bilateral  RETROGRADE PYELOGRAM, right URETEROSCOPY AND right  STENT PLACEMENT, bladder biopsy with fulgeration;  Surgeon: Sebastian Ache, MD;  Location: [facility];  Service: Urology;  Laterality: Right;  . Holmium laser application Right 01/31/2014    Procedure: HOLMIUM LASER APPLICATION;  Surgeon: Sebastian Ache, MD;  Location: [facility];  Service: Urology;  Laterality: Right;  . Humerus im nail Right 06/10/2014    Procedure: INTRAMEDULLARY (IM) NAIL HUMERAL;  Surgeon: Mable Paris, MD;  Location: [facility];  Service: Orthopedics;  Laterality: Right;  Right intramedullary humeral nail  . Hardware removal Right 09/09/2014    Procedure: HARDWARE REMOVAL RIGHT SHOULDER;  Surgeon: Mable Paris, MD;  Location: [facility];  Service: Orthopedics;  Laterality: Right;  Right shoulder hardware removal  . Total knee arthroplasty Right 02/17/2015    Procedure: TOTAL KNEE ARTHROPLASTY;  Surgeon: Gean Birchwood, MD;  Location: [facility];  Service: Orthopedics;  Laterality: Right;   Family History  Problem Relation Age of Onset  . Stroke Mother   . Stroke Brother    Social History  Substance Use Topics  . Smoking status: Never Smoker   . Smokeless tobacco: Never Used  . Alcohol Use: No   OB History    No  data available      Review of Systems  Constitutional: Positive for fever. Negative for chills.  HENT: Positive for congestion. Negative for sore throat.   Respiratory: Positive for cough and shortness of breath.   Cardiovascular: Negative for chest pain and leg swelling.  Gastrointestinal: Negative for nausea, vomiting, abdominal pain, diarrhea and constipation.  Neurological: Negative for dizziness, syncope, weakness, light-headedness, numbness and headaches.  All other systems reviewed and are negative.     Allergies  Codeine; Penicillins; and Sulfur  Home Medications   Prior to Admission medications    Medication Sig Start Date End Date Taking? Authorizing Provider  atorvastatin (LIPITOR) 20 MG tablet Take 20 mg by mouth daily. 01/06/15  Yes Historical Provider, MD  diltiazem (DILACOR XR) 180 MG 24 hr capsule Take 180 mg by mouth every morning.   Yes Historical Provider, MD  DULoxetine (CYMBALTA) 60 MG capsule Take 60 mg by mouth every morning.    Yes Historical Provider, MD  furosemide (LASIX) 40 MG tablet TAKE 1 TABLET BY MOUTH EVERY DAY 02/10/15  Yes Vesta Mixer, MD  LORazepam (ATIVAN) 0.5 MG tablet Take 0.5 mg by mouth every 8 (eight) hours as needed for anxiety.   Yes Historical Provider, MD  meloxicam (MOBIC) 15 MG tablet Take 15 mg by mouth daily. 01/06/15  Yes Historical Provider, MD  metoprolol tartrate (LOPRESSOR) 25 MG tablet Take 50 mg by mouth 2 (two) times daily.  03/23/12  Yes Kirby Funk, MD  oxyCODONE-acetaminophen (ROXICET) 5-325 MG per tablet Take 1 tablet by mouth every 4 (four) hours as needed for severe pain. 02/18/15  Yes Gean Birchwood, MD  potassium chloride SA (K-DUR,KLOR-CON) 20 MEQ tablet Take 1 tablet (20 mEq total) by mouth daily. 01/28/15 04/06/17 Yes Zannie Cove, MD  ranitidine (ZANTAC) 150 MG tablet Take 150 mg by mouth daily as needed for heartburn.   Yes Historical Provider, MD  rivaroxaban (XARELTO) 20 MG TABS tablet Take 1 tablet (20 mg total) by mouth daily with breakfast. 07/24/15  Yes Vesta Mixer, MD  tizanidine (ZANAFLEX) 2 MG capsule Take 1 capsule (2 mg total) by mouth 3 (three) times daily as needed for muscle spasms. 02/18/15  Yes Gean Birchwood, MD  traMADol (ULTRAM) 50 MG tablet Take 1 tablet (50 mg total) by mouth every 6 (six) hours as needed for moderate pain. Post-operatively 01/31/14  Yes Sebastian Ache, MD  zolpidem (AMBIEN) 10 MG tablet Take 10 mg by mouth at bedtime. For sleep   Yes Historical Provider, MD  azithromycin (ZITHROMAX Z-PAK) 250 MG tablet Take 1 tablet (250 mg total) by mouth daily. 500mg  PO day 1, then 250mg  PO days 205 10/06/15    Mady Gemma, PA-C  senna-docusate (SENOKOT-S) 8.6-50 MG per tablet Take 1 tablet by mouth 2 (two) times daily. While taking pain meds to prevent constipation Patient not taking: Reported on 08/07/2015 01/31/14   Sebastian Ache, MD    BP 138/68 mmHg  Pulse 76  Temp(Src) 98.3 F (36.8 C) (Oral)  Resp 18  SpO2 92% Physical Exam  Constitutional: She is oriented to person, place, and time. She appears well-developed and well-nourished. No distress.  HENT:  Head: Normocephalic and atraumatic.  Right Ear: External ear normal.  Left Ear: External ear normal.  Nose: Nose normal.  Mouth/Throat: Uvula is midline, oropharynx is clear and moist and mucous membranes are normal.  Eyes: Conjunctivae, EOM and lids are normal. Pupils are equal, round, and reactive to light. Right eye exhibits no discharge. Left  eye exhibits no discharge. No scleral icterus.  Neck: Normal range of motion. Neck supple.  Cardiovascular: Normal rate, regular rhythm, normal heart sounds, intact distal pulses and normal pulses.   Pulmonary/Chest: Effort normal. No respiratory distress. She has wheezes. She has no rales.  Decreased air movement bilaterally. Mild wheezing to upper lung fields bilaterally.  Abdominal: Soft. Normal appearance and bowel sounds are normal. She exhibits no distension and no mass. There is no tenderness. There is no rigidity, no rebound and no guarding.  Musculoskeletal: Normal range of motion. She exhibits no edema or tenderness.  Neurological: She is alert and oriented to person, place, and time. She has normal strength. No cranial nerve deficit or sensory deficit.  Skin: Skin is warm, dry and intact. No rash noted. She is not diaphoretic. No erythema. No pallor.  Psychiatric: She has a normal mood and affect. Her speech is normal and behavior is normal.  Nursing note and vitals reviewed.   ED Course  Procedures (including critical care time)  Labs Review Labs Reviewed  CBC WITH  DIFFERENTIAL/PLATELET - Abnormal; Notable for the following:    Hemoglobin 10.6 (*)    HCT 34.0 (*)    All other components within normal limits  COMPREHENSIVE METABOLIC PANEL - Abnormal; Notable for the following:    Chloride 100 (*)    Glucose, Bld 122 (*)    Creatinine, Ser 1.07 (*)    ALT 10 (*)    GFR calc non Af Amer 46 (*)    GFR calc Af Amer 53 (*)    All other components within normal limits  CULTURE, RESPIRATORY (NON-EXPECTORATED)  I-STAT CG4 LACTIC ACID, ED  I-STAT CG4 LACTIC ACID, ED  Rosezena Sensor, ED    Imaging Review Dg Chest 2 View  10/06/2015  CLINICAL DATA:  Productive cough for 3 days.  Fatigue. EXAM: CHEST - 2 VIEW COMPARISON:  Two-view chest x-ray 08/07/2015. FINDINGS: The heart is enlarged. Atherosclerotic calcifications are present at the aortic arch. Mild chronic interstitial coarsening is present. Previously seen pleural effusions have cleared. No focal airspace consolidation is present. Remote left-sided rib fractures are again noted. IMPRESSION: 1. Stable cardiomegaly without failure. 2. Diffuse interstitial coarsening is chronic. Electronically Signed   By: Marin Roberts M.D.   On: 10/06/2015 16:04   I have personally reviewed and evaluated these images and lab results as part of my medical decision-making.   EKG Interpretation None      MDM   Final diagnoses:  Cough    82 year old female presents with nasal congestion and productive cough x 1.5 weeks. Reports subjective fever, fatigue, as well as shortness of breath.  Patient is afebrile. Vital signs stable. No tachypnea. O2 sat 90s on RA. Heart regular rhythm. Lungs with decreased air movement bilaterally. Mild wheezing to upper lung fields bilaterally. Abdomen soft, non-tender, non-distended. No lower extremity edema. Strength and sensation intact.  CBC negative for leukocytosis, hemoglobin 10.6, which appears stable. CMP remarkable for creatinine 1.07, which appears mildly elevated from  baseline. EKG atrial fibrillation, HR 75. Troponin negative. CXR remarkable for stable cardiomegaly without failure, diffuse chronic interstitial coarsening.  Patient discussed with and seen by Dr. Hyacinth Meeker. Will order breathing treatment and reassess.  Patient reports symptom improvement; on exam, wheezing improved bilaterally.   Patient is non-toxic and well-appearing, feel she is stable for discharge at this time. Will treat with azithryomycin. Patient to follow-up with PCP in 2-3 days. Return precautions discussed. Patient and family member verbalize their understanding  and are in agreement with plan.  BP 138/68 mmHg  Pulse 76  Temp(Src) 98.3 F (36.8 C) (Oral)  Resp 18  SpO2 92%     Mady Gemma, PA-C 10/07/15 0127  Eber Hong, MD 10/08/15 [phone]

## 2015-10-06 NOTE — Discharge Instructions (Signed)
1. Medications: azithromycin, usual home medications 2. Treatment: rest, drink plenty of fluids 3. Follow Up: please followup with your primary doctor in 2-3 days for discussion of your diagnoses and further evaluation after today's visit; if you do not have a primary care doctor use the resource guide provided to find one; please return to the ER for high fever, chest pain, shortness of breath, new or worsening symptoms   Cough, Adult Coughing is a reflex that clears your throat and your airways. Coughing helps to heal and protect your lungs. It is normal to cough occasionally, but a cough that happens with other symptoms or lasts a long time may be a sign of a condition that needs treatment. A cough may last only 2-3 weeks (acute), or it may last longer than 8 weeks (chronic). CAUSES Coughing is commonly caused by:  Breathing in substances that irritate your lungs.  A viral or bacterial respiratory infection.  Allergies.  Asthma.  Postnasal drip.  Smoking.  Acid backing up from the stomach into the esophagus (gastroesophageal reflux).  Certain medicines.  Chronic lung problems, including COPD (or rarely, lung cancer).  Other medical conditions such as heart failure. HOME CARE INSTRUCTIONS  Pay attention to any changes in your symptoms. Take these actions to help with your discomfort:  Take medicines only as told by your health care provider.  If you were prescribed an antibiotic medicine, take it as told by your health care provider. Do not stop taking the antibiotic even if you start to feel better.  Talk with your health care provider before you take a cough suppressant medicine.  Drink enough fluid to keep your urine clear or pale yellow.  If the air is dry, use a cold steam vaporizer or humidifier in your bedroom or your home to help loosen secretions.  Avoid anything that causes you to cough at work or at home.  If your cough is worse at night, try sleeping in a  semi-upright position.  Avoid cigarette smoke. If you smoke, quit smoking. If you need help quitting, ask your health care provider.  Avoid caffeine.  Avoid alcohol.  Rest as needed. SEEK MEDICAL CARE IF:   You have new symptoms.  You cough up pus.  Your cough does not get better after 2-3 weeks, or your cough gets worse.  You cannot control your cough with suppressant medicines and you are losing sleep.  You develop pain that is getting worse or pain that is not controlled with pain medicines.  You have a fever.  You have unexplained weight loss.  You have night sweats. SEEK IMMEDIATE MEDICAL CARE IF:  You cough up blood.  You have difficulty breathing.  Your heartbeat is very fast.   This information is not intended to replace advice given to you by your health care provider. Make sure you discuss any questions you have with your health care provider.   Document Released: 02/19/2011 Document Revised: 05/14/2015 Document Reviewed: 10/30/2014 Elsevier Interactive Patient Education [employer]

## 2015-10-06 NOTE — ED Provider Notes (Signed)
The pt has had cough with some fat with dizzy fatigue - no fevers, no CP, ON exam has wheezing, ronchi, lungs, CXR - without acute findings  On exam the patient does have increased wheezing with forced expiration, otherwise she appears in no distress, no peripheral edema and no JVD. Her EKG shows a left bundle branch block, atrial fibrillation, no other acute findings., Chest x-ray shows no acute findings, given the patient's clinical findings of wheezing and rhonchi she will need an antibiotic, nebulized treatment at home with a short course of antibiotics with close follow-up. These results and plan were discussed with the patient and her family members who are in total agreement.  ED ECG REPORT  I personally interpreted this EKG   Date: 10/08/2015   Rate: 75  Rhythm: atrial fibrillation  QRS Axis: left  Intervals: normal  ST/T Wave abnormalities: nonspecific ST/T changes  Conduction Disutrbances:left bundle branch block  Narrative Interpretation:   Old EKG Reviewed: unchanged from 08/2015   Medical screening examination/treatment/procedure(s) were conducted as a shared visit with non-physician practitioner(s) and myself.  I personally evaluated the patient during the encounter.  Clinical Impression:   Final diagnoses:  Cough         Eber Hong, MD 10/08/15 [phone]

## 2015-10-06 NOTE — ED Notes (Signed)
Per family 3 day hx of productive cough x 3 days and fatigue per family

## 2015-10-09 LAB — CULTURE, RESPIRATORY W GRAM STAIN

## 2015-10-09 LAB — CULTURE, RESPIRATORY: CULTURE: NORMAL

## 2015-10-13 ENCOUNTER — Other Ambulatory Visit: Payer: Self-pay | Admitting: Adult Health

## 2015-10-29 ENCOUNTER — Other Ambulatory Visit: Payer: Self-pay | Admitting: Adult Health

## 2015-10-30 ENCOUNTER — Other Ambulatory Visit: Payer: Self-pay | Admitting: Orthopedic Surgery

## 2015-10-30 DIAGNOSIS — M25311 Other instability, right shoulder: Secondary | ICD-10-CM

## 2015-11-04 ENCOUNTER — Ambulatory Visit
Admission: RE | Admit: 2015-11-04 | Discharge: 2015-11-04 | Disposition: A | Discharge status: Home / Self Care | Payer: Medicare Other | Source: Ambulatory Visit | Attending: Orthopedic Surgery | Admitting: Orthopedic Surgery

## 2015-11-04 DIAGNOSIS — M25311 Other instability, right shoulder: Secondary | ICD-10-CM

## 2015-11-16 ENCOUNTER — Other Ambulatory Visit: Payer: Self-pay | Admitting: Adult Health

## 2015-12-10 ENCOUNTER — Encounter (HOSPITAL_COMMUNITY): Payer: Self-pay

## 2015-12-10 ENCOUNTER — Emergency Department (HOSPITAL_COMMUNITY): Payer: Medicare Other

## 2015-12-10 ENCOUNTER — Emergency Department (HOSPITAL_COMMUNITY)
Admission: EM | Admit: 2015-12-10 | Discharge: 2015-12-10 | Disposition: A | Discharge status: Home / Self Care | Payer: Medicare Other | Attending: Emergency Medicine | Admitting: Emergency Medicine

## 2015-12-10 DIAGNOSIS — Y9389 Activity, other specified: Secondary | ICD-10-CM | POA: Insufficient documentation

## 2015-12-10 DIAGNOSIS — I509 Heart failure, unspecified: Secondary | ICD-10-CM | POA: Diagnosis not present

## 2015-12-10 DIAGNOSIS — Z880 Allergy status to penicillin: Secondary | ICD-10-CM | POA: Diagnosis not present

## 2015-12-10 DIAGNOSIS — F419 Anxiety disorder, unspecified: Secondary | ICD-10-CM | POA: Insufficient documentation

## 2015-12-10 DIAGNOSIS — I499 Cardiac arrhythmia, unspecified: Secondary | ICD-10-CM | POA: Diagnosis not present

## 2015-12-10 DIAGNOSIS — S76219A Strain of adductor muscle, fascia and tendon of unspecified thigh, initial encounter: Secondary | ICD-10-CM

## 2015-12-10 DIAGNOSIS — Z8701 Personal history of pneumonia (recurrent): Secondary | ICD-10-CM | POA: Diagnosis not present

## 2015-12-10 DIAGNOSIS — Z8673 Personal history of transient ischemic attack (TIA), and cerebral infarction without residual deficits: Secondary | ICD-10-CM | POA: Insufficient documentation

## 2015-12-10 DIAGNOSIS — R011 Cardiac murmur, unspecified: Secondary | ICD-10-CM | POA: Insufficient documentation

## 2015-12-10 DIAGNOSIS — Z79899 Other long term (current) drug therapy: Secondary | ICD-10-CM | POA: Diagnosis not present

## 2015-12-10 DIAGNOSIS — M1990 Unspecified osteoarthritis, unspecified site: Secondary | ICD-10-CM | POA: Insufficient documentation

## 2015-12-10 DIAGNOSIS — F329 Major depressive disorder, single episode, unspecified: Secondary | ICD-10-CM | POA: Diagnosis not present

## 2015-12-10 DIAGNOSIS — I10 Essential (primary) hypertension: Secondary | ICD-10-CM | POA: Diagnosis not present

## 2015-12-10 DIAGNOSIS — W1839XA Other fall on same level, initial encounter: Secondary | ICD-10-CM | POA: Diagnosis not present

## 2015-12-10 DIAGNOSIS — S79911A Unspecified injury of right hip, initial encounter: Secondary | ICD-10-CM | POA: Diagnosis present

## 2015-12-10 DIAGNOSIS — Y998 Other external cause status: Secondary | ICD-10-CM | POA: Insufficient documentation

## 2015-12-10 DIAGNOSIS — Z8719 Personal history of other diseases of the digestive system: Secondary | ICD-10-CM | POA: Insufficient documentation

## 2015-12-10 DIAGNOSIS — H9190 Unspecified hearing loss, unspecified ear: Secondary | ICD-10-CM | POA: Insufficient documentation

## 2015-12-10 DIAGNOSIS — Y92009 Unspecified place in unspecified non-institutional (private) residence as the place of occurrence of the external cause: Secondary | ICD-10-CM | POA: Diagnosis not present

## 2015-12-10 DIAGNOSIS — S76211A Strain of adductor muscle, fascia and tendon of right thigh, initial encounter: Secondary | ICD-10-CM | POA: Insufficient documentation

## 2015-12-10 DIAGNOSIS — Z87442 Personal history of urinary calculi: Secondary | ICD-10-CM | POA: Insufficient documentation

## 2015-12-10 DIAGNOSIS — Z791 Long term (current) use of non-steroidal anti-inflammatories (NSAID): Secondary | ICD-10-CM | POA: Diagnosis not present

## 2015-12-10 DIAGNOSIS — S3991XA Unspecified injury of abdomen, initial encounter: Secondary | ICD-10-CM | POA: Insufficient documentation

## 2015-12-10 MED ORDER — HYDROCODONE-ACETAMINOPHEN 5-325 MG PO TABS
1.0000 | ORAL_TABLET | Freq: Once | ORAL | Status: AC
  Administered 2015-12-10: 1 via ORAL
  Filled 2015-12-10: qty 1

## 2015-12-10 MED ORDER — HYDROCODONE-ACETAMINOPHEN 5-325 MG PO TABS: 1.0000 | ORAL_TABLET | ORAL | Status: DC | PRN

## 2015-12-10 NOTE — ED Provider Notes (Signed)
CSN: [id number]     Arrival date & time 12/10/15  1748 History   First MD Initiated Contact with Patient 12/10/15 2022     Chief Complaint  Patient presents with  . Hip Pain     (Consider location/radiation/quality/duration/timing/severity/associated sxs/prior Treatment) HPI Patient fell 3 weeks ago transferring from her wheelchair. She reports she had to lay on her right side all night. She denies she has significant pain at this time. Over the past several days however she has been developing a lot of pain in her right hip. She is also noted pain on her anterior thigh. She reports the pain is worse in the sitting position. At baseline she is very limited in her mobility and only transfers from a chair. She does not specifically of numbness or tingling. She identifies the pain is being predominantly in her right SI region and then radiating around to the groin and anterior thigh. Past Medical History  Diagnosis Date  . Hypertension   . Arthritis   . Diastolic dysfunction   . Hyperlipidemia   . Congestive heart failure (HCC)     Congestive heart failure symptoms  . Heart murmur   . Dysrhythmia 05-10-12    hx. A. Fib., rate is controlled-tx. Xarelto  . Pneumonia 05-10-12    dx. in July- tx. antibiotics  . Hearing loss 05-10-12    bilateral hearing aids.  Marland Kitchen GERD (gastroesophageal reflux disease) 05-10-12    tx. with meds as needed  . Depression   . Stroke (HCC) 05-10-12    Lt.CVA note on scans-hx. freq. falls/ none past 6 months 01/21/14  . Wears hearing aid     bilateral  . Dyspnea     occasionally, with exertion  . Anxiety   . History of kidney stones    Past Surgical History  Procedure Laterality Date  . Appendectomy    . Other surgical history      Bladder Surgery  . Interstim implant placement  05-10-12    now malfunctioning  . Cataract extraction, bilateral  05-10-12    bilateral  . Interstim implant removal  05/16/2012    Procedure: REMOVAL OF INTERSTIM IMPLANT;  Surgeon: Martina Sinner, MD;  Location: [facility];  Service: Urology;  Laterality: N/A;  . Cystoscopy with retrograde pyelogram, ureteroscopy and stent placement Right 01/31/2014    Procedure: CYSTOSCOPY WITH bilateral  RETROGRADE PYELOGRAM, right URETEROSCOPY AND right  STENT PLACEMENT, bladder biopsy with fulgeration;  Surgeon: Sebastian Ache, MD;  Location: [facility];  Service: Urology;  Laterality: Right;  . Holmium laser application Right 01/31/2014    Procedure: HOLMIUM LASER APPLICATION;  Surgeon: Sebastian Ache, MD;  Location: [facility];  Service: Urology;  Laterality: Right;  . Humerus im nail Right 06/10/2014    Procedure: INTRAMEDULLARY (IM) NAIL HUMERAL;  Surgeon: Mable Paris, MD;  Location: [facility];  Service: Orthopedics;  Laterality: Right;  Right intramedullary humeral nail  . Hardware removal Right 09/09/2014    Procedure: HARDWARE REMOVAL RIGHT SHOULDER;  Surgeon: Mable Paris, MD;  Location: [facility];  Service: Orthopedics;  Laterality: Right;  Right shoulder hardware removal  . Total knee arthroplasty Right 02/17/2015    Procedure: TOTAL KNEE ARTHROPLASTY;  Surgeon: Gean Birchwood, MD;  Location: [facility];  Service: Orthopedics;  Laterality: Right;   Family History  Problem Relation Age of Onset  . Stroke Mother   . Stroke Brother    Social History  Substance Use Topics  . Smoking status: Never  Smoker   . Smokeless tobacco: Never Used  . Alcohol Use: No   OB History    No data available     Review of Systems  10 Systems reviewed and are negative for acute change except as noted in the HPI.   Allergies  Codeine; Penicillins; and Sulfur  Home Medications   Prior to Admission medications   Medication Sig Start Date End Date Taking? Authorizing Provider  atorvastatin (LIPITOR) 20 MG tablet Take 20 mg by mouth daily. 01/06/15  Yes Historical Provider, MD  diltiazem (DILACOR XR) 180 MG 24 hr capsule Take 180 mg by mouth every morning.   Yes Historical Provider,  MD  DULoxetine (CYMBALTA) 60 MG capsule Take 60 mg by mouth every morning.    Yes Historical Provider, MD  furosemide (LASIX) 40 MG tablet TAKE 1 TABLET BY MOUTH EVERY DAY 02/10/15  Yes Vesta Mixer, MD  ipratropium-albuterol (DUONEB) 0.5-2.5 (3) MG/3ML SOLN U 3 ML VIA NEB QID 10/20/15  Yes Historical Provider, MD  LORazepam (ATIVAN) 0.5 MG tablet Take 0.5 mg by mouth every 8 (eight) hours as needed for anxiety.   Yes Historical Provider, MD  meloxicam (MOBIC) 15 MG tablet Take 15 mg by mouth daily. 01/06/15  Yes Historical Provider, MD  metoprolol tartrate (LOPRESSOR) 25 MG tablet Take 50 mg by mouth 2 (two) times daily.  03/23/12  Yes Kirby Funk, MD  potassium chloride SA (K-DUR,KLOR-CON) 20 MEQ tablet Take 1 tablet (20 mEq total) by mouth daily. 01/28/15 04/06/17 Yes Zannie Cove, MD  traMADol (ULTRAM) 50 MG tablet Take 1 tablet (50 mg total) by mouth every 6 (six) hours as needed for moderate pain. Post-operatively 01/31/14  Yes Sebastian Ache, MD  zolpidem (AMBIEN) 10 MG tablet Take 10 mg by mouth at bedtime. For sleep   Yes Historical Provider, MD  azithromycin (ZITHROMAX Z-PAK) 250 MG tablet Take 1 tablet (250 mg total) by mouth daily.  PO day 1, then  PO days 205 Patient not taking: Reported on 12/10/2015 10/06/15   Mady Gemma, PA-C  HYDROcodone-acetaminophen (NORCO/VICODIN) 5-325 MG tablet Take 1-2 tablets by mouth every 4 (four) hours as needed for moderate pain or severe pain. 12/10/15   Arby Barrette, MD  oxyCODONE-acetaminophen (ROXICET) 5-325 MG per tablet Take 1 tablet by mouth every 4 (four) hours as needed for severe pain. Patient not taking: Reported on 12/10/2015 02/18/15   Gean Birchwood, MD  rivaroxaban (XARELTO) 20 MG TABS tablet Take 1 tablet (20 mg total) by mouth daily with breakfast. Patient not taking: Reported on 12/10/2015 07/24/15   Vesta Mixer, MD  senna-docusate (SENOKOT-S) 8.6-50 MG per tablet Take 1 tablet by mouth 2 (two) times daily. While taking pain  meds to prevent constipation Patient not taking: Reported on 08/07/2015 01/31/14   Sebastian Ache, MD  tizanidine (ZANAFLEX) 2 MG capsule Take 1 capsule (2 mg total) by mouth 3 (three) times daily as needed for muscle spasms. Patient not taking: Reported on 12/10/2015 02/18/15   Gean Birchwood, MD   BP 145/60 mmHg  Pulse 95  Temp(Src) 98.4 F (36.9 C) (Oral)  Resp 16  SpO2 90% Physical Exam  Constitutional: She is oriented to person, place, and time.  Patient is alert and nontoxic nor history distress.  HENT:  Head: Normocephalic and atraumatic.  Eyes: EOM are normal. Pupils are equal, round, and reactive to light.  Neck: Neck supple.  Cardiovascular: Normal rate, regular rhythm, normal heart sounds and intact distal pulses.   Pulmonary/Chest: Effort normal  and breath sounds normal. She exhibits no tenderness.  Abdominal: Soft. Bowel sounds are normal. She exhibits no distension. There is tenderness.  A endorses tenderness to palpation in her lateral flank area and SI joint region.  Musculoskeletal:  Patient versus pain to palpation over her SI joint region and buttock. As well but endorses discomfort in the right groin. No palpable mass or abnormality. Normal pulse. No rotation or shortening of the extremity. No apparent contusions or abrasions.  Neurological: She is alert and oriented to person, place, and time. She exhibits normal muscle tone. Coordination normal.  Skin: Skin is warm and dry.  Psychiatric: She has a normal mood and affect.    ED Course  Procedures (including critical care time) Labs Review Labs Reviewed - No data to display  Imaging Review Dg Hip Unilat With Pelvis 2-3 Views Right  12/10/2015  CLINICAL DATA:  Fall 3 weeks ago from wheelchair with hip pain, initial encounter EXAM: DG HIP (WITH OR WITHOUT PELVIS) 2-3V RIGHT COMPARISON:  None. FINDINGS: Pelvic ring is intact. No acute fracture or dislocation is noted. No gross soft tissue abnormality is seen. Benign  calcification is noted in the right hemipelvis. IMPRESSION: No acute abnormality noted. Electronically Signed   By: Alcide CleverMark  Lukens M.D.   On: 12/10/2015 19:46   I have personally reviewed and evaluated these images and lab results as part of my medical decision-making.   EKG Interpretation None      MDM   Final diagnoses:  Groin strain, initial encounter   Patient has pain that is very consistent with groin strain with possible Sciatica as well. She identifies pain that seems to start in the SI region and then traveled down her lateral leg to her anterior thigh. Soft tissue examination is normal. At this time patient will be given Vicodin for pain. She reports she has taken this in the past without difficulty. She has been counseled on taking stool softeners to avoid constipation. She lives with family members. At baseline Patient  has very limited mobility for ambulation.    Arby BarretteMarcy Alantra Popoca, MD 12/10/15 2148

## 2015-12-10 NOTE — ED Notes (Addendum)
Per EMS, pt from home.  Pt fell x 3 weeks ago.  Trying to get into her wheelchair.  Larey SeatFell out of chair onto rt side.  C/O hip pain.  Evaluated at that time by EMS but did not go to hospital.  Pain continues.  Family has been helping her.  Pain has increased.  No shortening noted.  Vitals:  144/70, hr 80, resp 18,   IV 20 g in Lt hand.  100mcg Fentanyl  In route

## 2015-12-10 NOTE — ED Notes (Signed)
MD at bedside. 

## 2015-12-10 NOTE — Discharge Instructions (Signed)
Suspected Sciatica with muscle strain Sciatica is pain, weakness, numbness, or tingling along the path of the sciatic nerve. The nerve starts in the lower back and runs down the back of each leg. The nerve controls the muscles in the lower leg and in the back of the knee, while also providing sensation to the back of the thigh, lower leg, and the sole of your foot. Sciatica is a symptom of another medical condition. For instance, nerve damage or certain conditions, such as a herniated disk or bone spur on the spine, pinch or put pressure on the sciatic nerve. This causes the pain, weakness, or other sensations normally associated with sciatica. Generally, sciatica only affects one side of the body. CAUSES   Herniated or slipped disc.  Degenerative disk disease.  A pain disorder involving the narrow muscle in the buttocks (piriformis syndrome).  Pelvic injury or fracture.  Pregnancy.  Tumor (rare). SYMPTOMS  Symptoms can vary from mild to very severe. The symptoms usually travel from the low back to the buttocks and down the back of the leg. Symptoms can include:  Mild tingling or dull aches in the lower back, leg, or hip.  Numbness in the back of the calf or sole of the foot.  Burning sensations in the lower back, leg, or hip.  Sharp pains in the lower back, leg, or hip.  Leg weakness.  Severe back pain inhibiting movement. These symptoms may get worse with coughing, sneezing, laughing, or prolonged sitting or standing. Also, being overweight may worsen symptoms. DIAGNOSIS  Your caregiver will perform a physical exam to look for common symptoms of sciatica. He or she may ask you to do certain movements or activities that would trigger sciatic nerve pain. Other tests may be performed to find the cause of the sciatica. These may include:  Blood tests.  X-rays.  Imaging tests, such as an MRI or CT scan. TREATMENT  Treatment is directed at the cause of the sciatic pain. Sometimes,  treatment is not necessary and the pain and discomfort goes away on its own. If treatment is needed, your caregiver may suggest:  Over-the-counter medicines to relieve pain.  Prescription medicines, such as anti-inflammatory medicine, muscle relaxants, or narcotics.  Applying heat or ice to the painful area.  Steroid injections to lessen pain, irritation, and inflammation around the nerve.  Reducing activity during periods of pain.  Exercising and stretching to strengthen your abdomen and improve flexibility of your spine. Your caregiver may suggest losing weight if the extra weight makes the back pain worse.  Physical therapy.  Surgery to eliminate what is pressing or pinching the nerve, such as a bone spur or part of a herniated disk. HOME CARE INSTRUCTIONS   Only take over-the-counter or prescription medicines for pain or discomfort as directed by your caregiver.  Apply ice to the affected area for 20 minutes, 3-4 times a day for the first 48-72 hours. Then try heat in the same way.  Exercise, stretch, or perform your usual activities if these do not aggravate your pain.  Attend physical therapy sessions as directed by your caregiver.  Keep all follow-up appointments as directed by your caregiver.  Do not wear high heels or shoes that do not provide proper support.  Check your mattress to see if it is too soft. A firm mattress may lessen your pain and discomfort. SEEK IMMEDIATE MEDICAL CARE IF:   You lose control of your bowel or bladder (incontinence).  You have increasing weakness in the  lower back, pelvis, buttocks, or legs.  You have redness or swelling of your back.  You have a burning sensation when you urinate.  You have pain that gets worse when you lie down or awakens you at night.  Your pain is worse than you have experienced in the past.  Your pain is lasting longer than 4 weeks.  You are suddenly losing weight without reason. MAKE SURE  YOU:  Understand these instructions.  Will watch your condition.  Will get help right away if you are not doing well or get worse.   This information is not intended to replace advice given to you by your health care provider. Make sure you discuss any questions you have with your health care provider.   Document Released: 08/17/2001 Document Revised: 05/14/2015 Document Reviewed: 01/02/2012 Elsevier Interactive Patient Education 2016 Elsevier Inc.   Muscle Strain A muscle strain (pulled muscle) happens when a muscle is stretched beyond normal length. It happens when a sudden, violent force stretches your muscle too far. Usually, a few of the fibers in your muscle are torn. Muscle strain is common in athletes. Recovery usually takes 1-2 weeks. Complete healing takes 5-6 weeks.  HOME CARE   Follow the PRICE method of treatment to help your injury get better. Do this the first 2-3 days after the injury:  Protect. Protect the muscle to keep it from getting injured again.  Rest. Limit your activity and rest the injured body part.  Ice. Put ice in a plastic bag. Place a towel between your skin and the bag. Then, apply the ice and leave it on from 15-20 minutes each hour. After the third day, switch to moist heat packs.  Compression. Use a splint or elastic bandage on the injured area for comfort. Do not put it on too tightly.  Elevate. Keep the injured body part above the level of your heart.  Only take medicine as told by your doctor.  Warm up before doing exercise to prevent future muscle strains. GET HELP IF:   You have more pain or puffiness (swelling) in the injured area.  You feel numbness, tingling, or notice a loss of strength in the injured area. MAKE SURE YOU:   Understand these instructions.  Will watch your condition.  Will get help right away if you are not doing well or get worse.   This information is not intended to replace advice given to you by your health  care provider. Make sure you discuss any questions you have with your health care provider.   Document Released: 06/01/2008 Document Revised: 06/13/2013 Document Reviewed: 03/22/2013 Elsevier Interactive Patient Education [employer] Elsevier Inc.

## 2015-12-10 NOTE — ED Notes (Signed)
Pt in X-Ray ?

## 2016-01-05 ENCOUNTER — Other Ambulatory Visit: Payer: Self-pay | Admitting: Adult Health

## 2016-01-14 ENCOUNTER — Other Ambulatory Visit: Payer: Self-pay | Admitting: Orthopedic Surgery

## 2016-01-29 ENCOUNTER — Inpatient Hospital Stay: Admit: 2016-01-29 | Payer: No Typology Code available for payment source | Admitting: Orthopedic Surgery

## 2016-01-29 SURGERY — ARTHROPLASTY, SHOULDER, TOTAL, REVERSE
Anesthesia: Choice | Laterality: Right

## 2016-02-09 ENCOUNTER — Ambulatory Visit (INDEPENDENT_AMBULATORY_CARE_PROVIDER_SITE_OTHER): Payer: Medicare Other | Admitting: Cardiovascular Disease

## 2016-02-09 ENCOUNTER — Encounter: Payer: Self-pay | Admitting: Cardiovascular Disease

## 2016-02-09 VITALS — BP 128/64 | HR 60 | Ht 68.0 in

## 2016-02-09 DIAGNOSIS — I10 Essential (primary) hypertension: Secondary | ICD-10-CM

## 2016-02-09 DIAGNOSIS — I5032 Chronic diastolic (congestive) heart failure: Secondary | ICD-10-CM

## 2016-02-09 NOTE — Patient Instructions (Signed)

## 2016-02-09 NOTE — Progress Notes (Signed)
Cardiology Office Note   Date:  02/09/2016   ID:  Carrie Best, DOB September 16, 1927, MRN [id number]  PCP:  Lillia Mountain, MD  Cardiologist:   Kristeen Miss, MD   Chief Complaint  Patient presents with  . Follow-up    afrial fib   1. Atrial fibrillation 2. Hypertension 3. Dementia 4. Diastolic dysfunction 5. Hyperlipidemia  History of Present Illness:  Carrie Best is an 82 yo who I have seen in the past. She was admitted with respiratory failure last year. She was found to She has developed some dementia. She has had frequent episodes of dyspnea. Typically with exertion. She does not eat any extra salt.   She still feels poorly - has good days and bad days   She is not able to walk any distance primarily due to leg weakness and leg pain.   Jan 16, 2014:  Carrie Best is doing oK. Able to do her normal activies with minimal dyspnea ( she does not do much).     February 06, 2015:  Carrie Best is a 82 y.o. female who presents for her  Atrial fib and chronic diastolic congestive heart failure. She was recently hospitalized for mental status changes ( hypersomulence)  No specific etiology was found   She needs to have knee surgery.   Will need to hold the Xarelto  February 06, 2016: Had her knee surgery last year but it was not successful.   Now she is wheelchair buond. Wants to have shoulder surgery now    Past Medical History  Diagnosis Date  . Hypertension   . Arthritis   . Diastolic dysfunction   . Hyperlipidemia   . Congestive heart failure (HCC)     Congestive heart failure symptoms  . Heart murmur   . Dysrhythmia 05-10-12    hx. A. Fib., rate is controlled-tx. Xarelto  . Pneumonia 05-10-12    dx. in July- tx. antibiotics  . Hearing loss 05-10-12    bilateral hearing aids.  Marland Kitchen GERD (gastroesophageal reflux disease) 05-10-12    tx. with meds as needed  . Depression   . Stroke (HCC) 05-10-12    Lt.CVA note on scans-hx. freq. falls/ none past 6 months  01/21/14  . Wears hearing aid     bilateral  . Dyspnea     occasionally, with exertion  . Anxiety   . History of kidney stones     Past Surgical History  Procedure Laterality Date  . Appendectomy    . Other surgical history      Bladder Surgery  . Interstim implant placement  05-10-12    now malfunctioning  . Cataract extraction, bilateral  05-10-12    bilateral  . Interstim implant removal  05/16/2012    Procedure: REMOVAL OF INTERSTIM IMPLANT;  Surgeon: Martina Sinner, MD;  Location: [facility];  Service: Urology;  Laterality: N/A;  . Cystoscopy with retrograde pyelogram, ureteroscopy and stent placement Right 01/31/2014    Procedure: CYSTOSCOPY WITH bilateral  RETROGRADE PYELOGRAM, right URETEROSCOPY AND right  STENT PLACEMENT, bladder biopsy with fulgeration;  Surgeon: Sebastian Ache, MD;  Location: [facility];  Service: Urology;  Laterality: Right;  . Holmium laser application Right 01/31/2014    Procedure: HOLMIUM LASER APPLICATION;  Surgeon: Sebastian Ache, MD;  Location: [facility];  Service: Urology;  Laterality: Right;  . Humerus im nail Right 06/10/2014    Procedure: INTRAMEDULLARY (IM) NAIL HUMERAL;  Surgeon: Mable Paris, MD;  Location: [facility];  Service: Orthopedics;  Laterality: Right;  Right intramedullary humeral nail  . Hardware removal Right 09/09/2014    Procedure: HARDWARE REMOVAL RIGHT SHOULDER;  Surgeon: Mable ParisJustin William Chandler, MD;  Location: [facility];  Service: Orthopedics;  Laterality: Right;  Right shoulder hardware removal  . Total knee arthroplasty Right 02/17/2015    Procedure: TOTAL KNEE ARTHROPLASTY;  Surgeon: Carrie BirchwoodFrank Rowan, MD;  Location: [facility];  Service: Orthopedics;  Laterality: Right;     Current Outpatient Prescriptions  Medication Sig Dispense Refill  . atorvastatin (LIPITOR) 20 MG tablet Take 20 mg by mouth daily.    Marland Kitchen. diltiazem (DILACOR XR) 180 MG 24 hr capsule Take 180 mg by mouth every morning.    . DULoxetine (CYMBALTA) 60 MG capsule  Take 60 mg by mouth every morning.     . furosemide (LASIX) 40 MG tablet TAKE 1 TABLET BY MOUTH EVERY DAY 90 tablet 3  . HYDROcodone-acetaminophen (NORCO/VICODIN) 5-325 MG tablet Take 1-2 tablets by mouth every 4 (four) hours as needed for moderate pain or severe pain. 20 tablet 0  . ipratropium-albuterol (DUONEB) 0.5-2.5 (3) MG/3ML SOLN U 3 ML VIA NEB QID  0  . LORazepam (ATIVAN) 0.5 MG tablet Take 0.5 mg by mouth every 8 (eight) hours as needed for anxiety.    . meloxicam (MOBIC) 15 MG tablet Take 15 mg by mouth daily.    . metoprolol tartrate (LOPRESSOR) 25 MG tablet Take 50 mg by mouth 2 (two) times daily.     Marland Kitchen. oxyCODONE-acetaminophen (ROXICET) 5-325 MG per tablet Take 1 tablet by mouth every 4 (four) hours as needed for severe pain. 60 tablet 0  . potassium chloride SA (K-DUR,KLOR-CON) 20 MEQ tablet Take 1 tablet (20 mEq total) by mouth daily. 30 tablet 11  . rivaroxaban (XARELTO) 20 MG TABS tablet Take 1 tablet (20 mg total) by mouth daily with breakfast. 31 tablet 11  . senna-docusate (SENOKOT-S) 8.6-50 MG per tablet Take 1 tablet by mouth 2 (two) times daily. While taking pain meds to prevent constipation 30 tablet 0  . tizanidine (ZANAFLEX) 2 MG capsule Take 1 capsule (2 mg total) by mouth 3 (three) times daily as needed for muscle spasms. 60 capsule 0  . traMADol (ULTRAM) 50 MG tablet Take 1 tablet (50 mg total) by mouth every 6 (six) hours as needed for moderate pain. Post-operatively 30 tablet 0  . zolpidem (AMBIEN) 10 MG tablet Take 10 mg by mouth at bedtime. For sleep     No current facility-administered medications for this visit.    Allergies:   Codeine; Penicillins; and Sulfur    Social History:  The patient  reports that she has never smoked. She has never used smokeless tobacco. She reports that she does not drink alcohol or use illicit drugs.   Family History:  The patient's family history includes Stroke in her brother and mother.    ROS:  Please see the history of  present illness.    Review of Systems: Constitutional:  denies fever, chills, diaphoresis, appetite change and fatigue.  HEENT: denies photophobia, eye pain, redness, hearing loss, ear pain, congestion, sore throat, rhinorrhea, sneezing, neck pain, neck stiffness and tinnitus.  Respiratory: denies SOB, DOE, cough, chest tightness, and wheezing.  Cardiovascular: denies chest pain, palpitations and leg swelling.  Gastrointestinal: denies nausea, vomiting, abdominal pain, diarrhea, constipation, blood in stool.  Genitourinary: denies dysuria, urgency, frequency, hematuria, flank pain and difficulty urinating.  Musculoskeletal: denies  myalgias, back pain, joint swelling, arthralgias and gait problem.   Skin:  denies pallor, rash and wound.  Neurological: denies dizziness, seizures, syncope, weakness, light-headedness, numbness and headaches.   Hematological: denies adenopathy, easy bruising, personal or family bleeding history.  Psychiatric/ Behavioral: denies suicidal ideation, mood changes, confusion, nervousness, sleep disturbance and agitation.       All other systems are reviewed and negative.    PHYSICAL EXAM: VS:  BP 128/64 mmHg  Pulse 60  Ht 5\' 8"  (1.727 m)  Wt  , BMI There is no weight on file to calculate BMI. GEN: Well nourished, well developed, in no acute distress HEENT: normal Neck: no JVD, carotid bruits, or masses Cardiac: Irreg. Irreg. ; no murmurs, rubs, or gallops,no edema  Respiratory:  clear to auscultation bilaterally, normal work of breathing GI: soft, nontender, nondistended, + BS MS: no deformity or atrophy Skin: warm and dry, no rash Neuro:  Strength and sensation are intact Psych: normal   EKG:  EKG is not ordered today.    Recent Labs: 08/07/2015: B Natriuretic Peptide 466.2* 10/06/2015: ALT 10*; BUN 9; Creatinine, Ser 1.07*; Hemoglobin 10.6*; Platelets 200; Potassium 3.9; Sodium 138    Lipid Panel No results found for: CHOL, TRIG, HDL, CHOLHDL,  VLDL, LDLCALC, LDLDIRECT    Wt Readings from Last 3 Encounters:  08/07/15 180 lb (81.647 kg)  03/06/15 190 lb 12.8 oz (86.546 kg)  02/25/15 192 lb (87.091 kg)      Other studies Reviewed: Additional studies/ records that were reviewed today include: . Review of the above records demonstrates:   ASSESSMENT AND PLAN:  1. Atrial fibrillation - stable.  She is on xarelto.  Will need to hold the xarelto for 2-3  days prior to her shoulder surgery   2. Hypertension - BP is well controlled.   3. Dementia  4. Chronic Diastolic dysfunction: she is very stable .   No rales, No leg edema .  She is at low risk for her upcoming shoulder  Surgery if she decides to have it  .   5. Hyperlipidemia : managed by Dr. Valentina Lucks    Current medicines are reviewed at length with the patient today.  The patient does not have concerns regarding medicines.  The following changes have been made:  no change  Labs/ tests ordered today include:  No orders of the defined types were placed in this encounter.     Disposition:   FU with me in 1 year      Kristeen Miss, MD  02/09/2016 2:23 PM    [facility] [address] [place], [place], [place]  [address] Phone: [phone]; Fax: [phone]   [facility]  [address] [place], [place]  [address] [phone]    Fax [phone]

## 2016-03-16 DIAGNOSIS — Z8739 Personal history of other diseases of the musculoskeletal system and connective tissue: Secondary | ICD-10-CM

## 2016-03-16 DIAGNOSIS — Z8781 Personal history of (healed) traumatic fracture: Secondary | ICD-10-CM | POA: Insufficient documentation

## 2016-03-26 ENCOUNTER — Other Ambulatory Visit: Payer: Self-pay | Admitting: *Deleted

## 2016-03-26 MED ORDER — FUROSEMIDE 40 MG PO TABS: 40.0000 mg | ORAL_TABLET | Freq: Every day | ORAL | Status: DC

## 2016-05-19 DIAGNOSIS — Z96659 Presence of unspecified artificial knee joint: Secondary | ICD-10-CM

## 2016-05-19 DIAGNOSIS — S86811A Strain of other muscle(s) and tendon(s) at lower leg level, right leg, initial encounter: Secondary | ICD-10-CM | POA: Insufficient documentation

## 2016-05-19 DIAGNOSIS — T84018A Broken internal joint prosthesis, other site, initial encounter: Secondary | ICD-10-CM | POA: Insufficient documentation

## 2016-06-23 ENCOUNTER — Other Ambulatory Visit: Payer: Self-pay | Admitting: Dermatology

## 2016-10-29 ENCOUNTER — Other Ambulatory Visit: Payer: Self-pay | Admitting: Orthopedic Surgery

## 2016-11-15 ENCOUNTER — Inpatient Hospital Stay (HOSPITAL_COMMUNITY): Admission: RE | Admit: 2016-11-15 | Payer: Medicare Other | Source: Ambulatory Visit

## 2016-11-18 ENCOUNTER — Inpatient Hospital Stay: Admit: 2016-11-18 | Payer: No Typology Code available for payment source | Admitting: Orthopedic Surgery

## 2016-11-18 SURGERY — REVISION, REVERSE TOTAL ARTHROPLASTY, SHOULDER
Anesthesia: Choice | Laterality: Right

## 2016-11-29 ENCOUNTER — Ambulatory Visit (INDEPENDENT_AMBULATORY_CARE_PROVIDER_SITE_OTHER): Payer: Medicare Other | Admitting: Orthopaedic Surgery

## 2016-11-29 ENCOUNTER — Encounter (INDEPENDENT_AMBULATORY_CARE_PROVIDER_SITE_OTHER): Payer: Self-pay | Admitting: Orthopaedic Surgery

## 2016-11-29 VITALS — BP 119/65 | HR 76 | Resp 16 | Ht 68.0 in | Wt 170.0 lb

## 2016-11-29 DIAGNOSIS — M25511 Pain in right shoulder: Secondary | ICD-10-CM | POA: Diagnosis not present

## 2016-11-29 DIAGNOSIS — G8929 Other chronic pain: Secondary | ICD-10-CM

## 2016-11-29 DIAGNOSIS — M25561 Pain in right knee: Secondary | ICD-10-CM
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
## 2017-02-22 ENCOUNTER — Telehealth: Payer: Self-pay | Admitting: Cardiovascular Disease

## 2017-02-22 NOTE — Telephone Encounter (Signed)
Pt's daughter called because pt made her aware today that she has been having palpitations, a fluttery feeling at times for the last  3 to 4 days. Pt has no other symptoms. Pt has a history of A-fib. Pt has an appointment with Chelsea AusVin Bhagat  PA on Thursday 6/21 at 2:30 PM. Pt and daughter are aware to call the office if symptoms get worse prior O/V. Daughter verbalized understanding.

## 2017-02-22 NOTE — Telephone Encounter (Signed)
New message    Patient c/o Palpitations:  High priority if patient c/o lightheadedness and shortness of breath.  1. How long have you been having palpitations? 3-4 days  2. Are you currently experiencing lightheadedness and shortness of breath? Yes-at times  3. Have you checked your BP and heart rate? (document readings) no  4. Are you experiencing any other symptoms? No   Pt scheduled appt on 6/21 at 230pm with Vin Bhagat.

## 2017-02-23 NOTE — Progress Notes (Signed)
Cardiology Office Note    Date:  02/24/2017   ID:  Carrie Best, DOB 06/04/1928, MRN [id number]  PCP:  Carrie Funk, MD  Cardiologist:  Dr. Elease Hashimoto  Chief Complaint: Palpitations  History of Present Illness:   Carrie Best is a 83 y.o. female with hx of persistent atrial fibrillation on Xarelto, chronic LBBB, HTN, HLD, chronic diastolic CHF, dementia, stroke, wheelchair bound due to chronic knee issue and GERD presents for palpitations.   Adenosine myoview was normal in 2010. Last echo 03/2012 showed LVEF of 50-55%; grade 1 DD; mild MR, mild dilated LA, RV and RA.   She was doing well on cardiac stand point when last seen by Dr. Elease Hashimoto 02/09/16. She was clear for shoulder surgery with low risks however never done.  Patient is here for yearly follow-up and pre-op clearance for right shoulder surgery. Patient is hard to hear (did not has her hearing aid today) and has severe dementia. Daughter here during appointment however she also has a dementia. Hard was obtained history. Seems like the patient has a intermittent palpitation lasting for a few seconds lately. Also has some intermittent dyspnea not particularly related with palpitation. Trace edema on her legs bilaterally. She denies any chest pain, orthopnea, PND, syncope, dizziness or melena. Compliant with her medication.  Past Medical History:  Diagnosis Date  . Anxiety   . Arthritis   . Congestive heart failure (HCC)    Congestive heart failure symptoms  . Depression   . Diastolic dysfunction   . Dyspnea    occasionally, with exertion  . Dysrhythmia 05-10-12   hx. A. Fib., rate is controlled-tx. Xarelto  . GERD (gastroesophageal reflux disease) 05-10-12   tx. with meds as needed  . Hearing loss 05-10-12   bilateral hearing aids.  . Heart murmur   . History of kidney stones   . Hyperlipidemia   . Hypertension   . Pneumonia 05-10-12   dx. in July- tx. antibiotics  . Stroke (HCC) 05-10-12   Lt.CVA note on scans-hx. freq.  falls/ none past 6 months 01/21/14  . Wears hearing aid    bilateral    Past Surgical History:  Procedure Laterality Date  . APPENDECTOMY    . CATARACT EXTRACTION, BILATERAL  05-10-12   bilateral  . CYSTOSCOPY WITH RETROGRADE PYELOGRAM, URETEROSCOPY AND STENT PLACEMENT Right 01/31/2014   Procedure: CYSTOSCOPY WITH bilateral  RETROGRADE PYELOGRAM, right URETEROSCOPY AND right  STENT PLACEMENT, bladder biopsy with fulgeration;  Surgeon: Sebastian Ache, MD;  [facility]: [facility];  Service: Urology;  Laterality: Right;  . HARDWARE REMOVAL Right 09/09/2014   Procedure: HARDWARE REMOVAL RIGHT SHOULDER;  Surgeon: Mable Paris, MD;  Location: [facility];  Service: Orthopedics;  Laterality: Right;  Right shoulder hardware removal  . HOLMIUM LASER APPLICATION Right 01/31/2014   Procedure: HOLMIUM LASER APPLICATION;  Surgeon: Sebastian Ache, MD;  [facility];  Service: Urology;  Laterality: Right;  . HUMERUS IM NAIL Right 06/10/2014   Procedure: INTRAMEDULLARY (IM) NAIL HUMERAL;  Surgeon: Mable Paris, MD;  [facility];  Service: Orthopedics;  Laterality: Right;  Right intramedullary humeral nail  . INTERSTIM IMPLANT PLACEMENT  05-10-12   now malfunctioning  . INTERSTIM IMPLANT REMOVAL  05/16/2012   Procedure: REMOVAL OF INTERSTIM IMPLANT;  Surgeon: Martina Sinner, MD;  [facility];  Service: Urology;  Laterality: N/A;  . OTHER SURGICAL HISTORY     Bladder Surgery  . SHOULDER SURGERY    . TOTAL KNEE ARTHROPLASTY  Right 02/17/2015   Procedure: TOTAL KNEE ARTHROPLASTY;  Surgeon: Gean BirchwoodFrank Rowan, MD;  [facility];  Service: Orthopedics;  Laterality: Right;    Current Medications: Prior to Admission medications   Medication Sig Start Date End Date Taking? Authorizing Provider  atorvastatin (LIPITOR) 20 MG tablet Take 20 mg by mouth daily. 01/06/15   [provider]  CARTIA XT 180 MG 24 hr capsule  11/01/16   [provider]  diltiazem (DILACOR  XR) 180 MG 24 hr capsule Take 180 mg by mouth every morning.    [provider]  DOCOSAHEXAENOIC ACID PO Take 1 g by mouth.    [provider]  DULoxetine (CYMBALTA) 60 MG capsule Take 60 mg by mouth every morning.     [provider]  furosemide (LASIX) 40 MG tablet Take 1 tablet (40 mg total) by mouth daily. 03/26/16   Nahser, Carrie PingPhilip J, MD  HYDROcodone-acetaminophen (NORCO/VICODIN) 5-325 MG tablet Take 1-2 tablets by mouth every 4 (four) hours as needed for moderate pain or severe pain. 12/10/15   Arby BarrettePfeiffer, Marcy, MD  ipratropium-albuterol (DUONEB) 0.5-2.5 (3) MG/3ML SOLN U 3 ML VIA NEB QID 10/20/15   [provider]  LORazepam (ATIVAN) 0.5 MG tablet Take 0.5 mg by mouth every 8 (eight) hours as needed for anxiety.    [provider]  meloxicam (MOBIC) 15 MG tablet Take 15 mg by mouth daily. 01/06/15   [provider]  metoprolol tartrate (LOPRESSOR) 25 MG tablet Take 50 mg by mouth 2 (two) times daily.  03/23/12   Carrie FunkGriffin, John, MD  oxyCODONE-acetaminophen (ROXICET) 5-325 MG per tablet Take 1 tablet by mouth every 4 (four) hours as needed for severe pain. 02/18/15   Gean Birchwoodowan, Frank, MD  potassium chloride SA (K-DUR,KLOR-CON) 20 MEQ tablet Take 1 tablet (20 mEq total) by mouth daily. 01/28/15 04/06/17  Zannie CoveJoseph, Preetha, MD  rivaroxaban (XARELTO) 20 MG TABS tablet Take 1 tablet (20 mg total) by mouth daily with breakfast. 07/24/15   Nahser, Carrie PingPhilip J, MD  senna-docusate (SENOKOT-S) 8.6-50 MG per tablet Take 1 tablet by mouth 2 (two) times daily. While taking pain meds to prevent constipation 01/31/14   Sebastian AcheManny, Theodore, MD  tizanidine (ZANAFLEX) 2 MG capsule Take 1 capsule (2 mg total) by mouth 3 (three) times daily as needed for muscle spasms. 02/18/15   Gean Birchwoodowan, Frank, MD  traMADol (ULTRAM) 50 MG tablet Take 1 tablet (50 mg total) by mouth every 6 (six) hours as needed for moderate pain. Post-operatively 01/31/14   Sebastian AcheManny, Theodore, MD  zolpidem (AMBIEN) 10 MG tablet  Take 10 mg by mouth at bedtime. For sleep    [provider]    Allergies:   Codeine; Penicillins; and Sulfur   Social History   Social History  . Marital status: Widowed    Spouse name: N/A  . Number of children: N/A  . Years of education: N/A   Social History Main Topics  . Smoking status: Never Smoker  . Smokeless tobacco: Never Used  . Alcohol use No  . Drug use: No  . Sexual activity: No   Other Topics Concern  . None   Social History Narrative  . None     Family History:  The patient's family history includes Stroke in her brother and mother.   ROS:   Please see the history of present illness.    ROS All other systems reviewed and are negative.   PHYSICAL EXAM:   VS:  BP 110/60   Pulse 80  Ht 5\' 8"  (1.727 m)   SpO2 90%    GEN: Well nourished, well developed, in no acute distress  HEENT: normal  Neck: no JVD, carotid bruits, or masses Cardiac: IR IR no murmurs, rubs, or gallops,no edema  Respiratory:  clear to auscultation bilaterally, normal work of breathing GI: soft, nontender, nondistended, + BS MS: no deformity or atrophy  Skin: warm and dry, no rash Neuro:  Alert and Oriented x 3, Strength and sensation are intact Psych: euthymic mood, full affect  Wt Readings from Last 3 Encounters:  11/29/16 170 lb (77.1 kg)  08/07/15 180 lb (81.6 kg)  03/06/15 190 lb 12.8 oz (86.5 kg)      Studies/Labs Reviewed:   EKG:  EKG is ordered today.  The ekg ordered today demonstrates afib at rate of 69 bpm  Recent Labs: No results found for requested labs within last 8760 hours.   Lipid Panel No results found for: CHOL, TRIG, HDL, CHOLHDL, VLDL, LDLCALC, LDLDIRECT  Additional studies/ records that were reviewed today include:   As above   ASSESSMENT & PLAN:    1. Persistent atrial fibration  - Difficult to obtain history. Recently having intermittent dizziness and palpitation lasting for a few seconds. EKG today shows persistent A. fib at  controlled rate. Will get echocardiogram to assess LV function prior to cardiac clearance for her right shoulder surgery. Continue Cardizem CD 180 mg and Lopressor 50 mg twice a day. If persistent tachycardia might consider up titration. BP soft lwo.   2. HTN - Stable on current regimen.  3. HLD -Continue statin. Managed by PCP  4. Chronic diastolic CHF - Volume status stable. Continue Lasix.  5. Preoperative cardiac clearance for right shoulder surgery - Get echocardiogram prior to clearance.    Medication Adjustments/Labs and Tests Ordered: Current medicines are reviewed at length with the patient today.  Concerns regarding medicines are outlined above.  Medication changes, Labs and Tests ordered today are listed in the Patient Instructions below. There are no Patient Instructions on file for this visit.   Lorelei Pont, [place]  02/24/2017 2:46 PM    [facility] [address] [place], [place], [place]  [address] Phone: [phone]; Fax: [phone]

## 2017-02-24 ENCOUNTER — Encounter: Payer: Self-pay | Admitting: Physician Assistant

## 2017-02-24 ENCOUNTER — Ambulatory Visit (INDEPENDENT_AMBULATORY_CARE_PROVIDER_SITE_OTHER): Payer: Medicare Other | Admitting: Physician Assistant

## 2017-02-24 VITALS — BP 110/60 | HR 80 | Ht 68.0 in

## 2017-02-24 DIAGNOSIS — I481 Persistent atrial fibrillation: Secondary | ICD-10-CM

## 2017-02-24 DIAGNOSIS — I10 Essential (primary) hypertension: Secondary | ICD-10-CM | POA: Diagnosis not present

## 2017-02-24 DIAGNOSIS — I110 Hypertensive heart disease with heart failure: Secondary | ICD-10-CM

## 2017-02-24 DIAGNOSIS — I4819 Other persistent atrial fibrillation: Secondary | ICD-10-CM

## 2017-02-24 DIAGNOSIS — Z01810 Encounter for preprocedural cardiovascular examination: Secondary | ICD-10-CM

## 2017-02-24 DIAGNOSIS — I5032 Chronic diastolic (congestive) heart failure: Secondary | ICD-10-CM

## 2017-02-24 DIAGNOSIS — E784 Other hyperlipidemia: Secondary | ICD-10-CM

## 2017-02-24 DIAGNOSIS — E7849 Other hyperlipidemia: Secondary | ICD-10-CM

## 2017-02-24 NOTE — Patient Instructions (Signed)
Your physician recommends that you continue on your current medications as directed. Please refer to the Current Medication list given to you today.  Your physician has requested that you have an echocardiogram. Echocardiography is a painless test that uses sound waves to create images of your heart. It provides your doctor with information about the size and shape of your heart and how well your heart's chambers and valves are working. This procedure takes approximately one hour. There are no restrictions for this procedure.  Your physician recommends that you schedule a follow-up appointment in: 4 months with Dr. Elease HashimotoNahser.

## 2017-03-08 ENCOUNTER — Ambulatory Visit (HOSPITAL_COMMUNITY): Payer: Medicare Other | Attending: Cardiovascular Disease

## 2017-03-08 ENCOUNTER — Other Ambulatory Visit: Payer: Self-pay

## 2017-03-08 DIAGNOSIS — E7849 Other hyperlipidemia: Secondary | ICD-10-CM

## 2017-03-08 DIAGNOSIS — I10 Essential (primary) hypertension: Secondary | ICD-10-CM

## 2017-03-08 DIAGNOSIS — I5032 Chronic diastolic (congestive) heart failure: Secondary | ICD-10-CM

## 2017-03-08 DIAGNOSIS — I340 Nonrheumatic mitral (valve) insufficiency: Secondary | ICD-10-CM | POA: Diagnosis not present

## 2017-03-08 DIAGNOSIS — I481 Persistent atrial fibrillation: Secondary | ICD-10-CM | POA: Diagnosis not present

## 2017-03-08 DIAGNOSIS — I110 Hypertensive heart disease with heart failure: Secondary | ICD-10-CM

## 2017-03-08 DIAGNOSIS — E784 Other hyperlipidemia: Secondary | ICD-10-CM | POA: Diagnosis not present

## 2017-03-08 DIAGNOSIS — I4819 Other persistent atrial fibrillation: Secondary | ICD-10-CM

## 2017-03-10 ENCOUNTER — Encounter: Payer: Self-pay | Admitting: *Deleted

## 2017-03-10 ENCOUNTER — Telehealth: Payer: Self-pay | Admitting: Physician Assistant

## 2017-03-10 ENCOUNTER — Telehealth (INDEPENDENT_AMBULATORY_CARE_PROVIDER_SITE_OTHER): Payer: Self-pay | Admitting: Orthopaedic Surgery

## 2017-03-10 NOTE — Telephone Encounter (Signed)
Follow Up:    Returning your call, concerning his Echo results. 

## 2017-03-10 NOTE — Telephone Encounter (Signed)
Please advise 

## 2017-03-10 NOTE — Telephone Encounter (Signed)
-----   Message from HoxieBhavinkumar Bhagat, [place] sent at 03/10/2017 12:00 PM EDT ----- Echo showed normal pumping function. No WM abnormality, mild valvular disease and mild pulmonary hypertension.  She is cleared for surgery Moderate cardiac risk of 6.6% based on Lee Criteria.

## 2017-03-10 NOTE — Telephone Encounter (Signed)
Returned call to pts daughter, [name], [place], and she has been advised of pts echo results. She verbalized understanding.

## 2017-03-10 NOTE — Telephone Encounter (Signed)
Patient has been cleared thru her Cardiologist for surgery. Patient is ready to proceed with scheduling Rt shoulder surgery. Please advise.

## 2017-03-11 NOTE — Telephone Encounter (Signed)
Would like to see her again in office before scheduling surgery

## 2017-03-11 NOTE — Telephone Encounter (Signed)
Called and told pt to schedule an appt

## 2017-03-15 ENCOUNTER — Encounter (INDEPENDENT_AMBULATORY_CARE_PROVIDER_SITE_OTHER): Payer: Self-pay | Admitting: Orthopaedic Surgery

## 2017-03-15 ENCOUNTER — Ambulatory Visit (INDEPENDENT_AMBULATORY_CARE_PROVIDER_SITE_OTHER): Payer: Medicare Other

## 2017-03-15 ENCOUNTER — Ambulatory Visit (INDEPENDENT_AMBULATORY_CARE_PROVIDER_SITE_OTHER): Payer: Medicare Other | Admitting: Orthopaedic Surgery

## 2017-03-15 VITALS — BP 147/76 | HR 69 | Resp 17 | Ht 68.0 in | Wt 180.0 lb

## 2017-03-15 DIAGNOSIS — M25511 Pain in right shoulder: Secondary | ICD-10-CM | POA: Diagnosis not present

## 2017-03-15 DIAGNOSIS — G8929 Other chronic pain: Secondary | ICD-10-CM

## 2017-03-15 NOTE — Progress Notes (Signed)
Office Visit Note   Patient: Carrie Best           Date of Birth: 1927-12-18           MRN: [id number] Visit Date: 03/15/2017              Requested by: Kirby Funk, MD [address] [place], [place] [address] PCP: Kirby Funk, MD   Assessment & Plan: Visit Diagnoses:  1. Chronic right shoulder pain   Probable rotator cuff arthropathy right shoulder with old humeral head fracture. Fracture was fixed with an intramedullary nail. The proximal transverse fixation screws extruded and appears to be symptomatic  Plan: Very long discussion regarding different treatment options site. I agree that the definitive procedure would be a reverse shoulder arthroplasty. Films had a second opinion at [facility] and a primary evaluation by Dr. Ave Filter. The [facility] opinion suggested that the family not consider the surgery. I have discussed all of the options with the family and felt like one of the simple options was to remove the proximal extruded screw. She is tender in that area. It will not significantly improve her function but certainly might relieve some pain. This could be done under local  and family would like to proceed.  Follow-Up Instructions: No Follow-up on file.   Orders:  Orders Placed This Encounter  Procedures  . XR Shoulder Right   No orders of the defined types were placed in this encounter.     Procedures: No procedures performed   Clinical Data: No additional findings.   Subjective: Chief Complaint  Patient presents with  . Right Shoulder - Pain  . Shoulder Pain    Right shoulder pain x 2 years, fell x 2015, surgery, limited range of motion, Hydrocodone helps, not diabetic, wants to discuss surgery   Per our previous discussion several months ago. Carrie Best is a nonambulator and has been for almost 2 years. She's had a prior total knee replacement with a ruptured patella tendon. Several years ago she also sustained a right humeral head fracture  treated by Dr. Ave Filter with open reduction internal fixation using a short intramedullary nail. Films demonstrate a high riding humeral head with probable fracture healing. She does have some compromise of her activities but some may be unrealistic to have talked to her and her daughter who accompanies her. The real question is a risk benefit issue in terms of performing the definitive procedure of a reverse shoulder arthroplasty. We had also discussed removal of extruded transfixion screw knowing full well that will not provide functional relief of all of her pain. HPI  Review of Systems   Objective: Vital Signs: BP (!) 147/76 (BP Location: Left Arm, Patient Position: Sitting, Cuff Size: Normal)   Pulse 69   Resp 17   Ht 5\' 8"  (1.727 m)   Wt 180 lb (81.6 kg)   BMI 27.37 kg/m   Physical Exam  Ortho Exam right shoulder without ecchymosis or erythema. There is tenderness over a palpable head of the extruded disc screw there. There is limitation of motion probably based on her rotator cuff arthropathy I can abduct and flex 90. Functionally she can place her hand or mouth wash her face without too much trouble.  Specialty Comments:  No specialty comments available.  Imaging: Xr Shoulder Right  Result Date: 03/15/2017 Films of the right shoulder demonstrate an old humeral head fracture with an intramedullary nail. The nail was fixed proximally and distally. The proximal screw  has extruded and no longer divides any fixation. The humeral head rides high in the glenoid probably consistent with rotator cuff arthropathy unable to tell whether or not the fracture is healed    PMFS History: Patient Active Problem List   Diagnosis Date Noted  . 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 Rupture of right patellar tendon 05/19/2016  . H/O nonunion of fracture 03/16/2016  . Arthritis of knee 02/17/2015  . Primary osteoarthritis of right knee Valgus 02/14/2015  . Chronic diastolic CHF  (congestive heart failure) (HCC) 02/06/2015  . Altered mental status 01/28/2015  . Proximal humerus fracture 06/10/2014  . Acute on chronic diastolic congestive heart failure (HCC) 03/18/2012  . Healthcare-associated pneumonia 03/18/2012  . Obesity 03/18/2012  . HTN (hypertension) 03/18/2012  . GERD (gastroesophageal reflux disease) 03/18/2012  . Hyperlipidemia 03/18/2012  . Sleep disturbance 03/18/2012  . Atrial fibrillation (HCC) 03/18/2012   Past Medical History:  Diagnosis Date  . Anxiety   . Arthritis   . Congestive heart failure (HCC)    Congestive heart failure symptoms  . Depression   . Diastolic dysfunction   . Dyspnea    occasionally, with exertion  . Dysrhythmia 05-10-12   hx. A. Fib., rate is controlled-tx. Xarelto  . GERD (gastroesophageal reflux disease) 05-10-12   tx. with meds as needed  . Hearing loss 05-10-12   bilateral hearing aids.  . Heart murmur   . History of kidney stones   . Hyperlipidemia   . Hypertension   . Pneumonia 05-10-12   dx. in July- tx. antibiotics  . Stroke (HCC) 05-10-12   Lt.CVA note on scans-hx. freq. falls/ none past 6 months 01/21/14  . Wears hearing aid    bilateral    Family History  Problem Relation Age of Onset  . Stroke Mother   . Stroke Brother     Past Surgical History:  Procedure Laterality Date  . APPENDECTOMY    . CATARACT EXTRACTION, BILATERAL  05-10-12   bilateral  . CYSTOSCOPY WITH RETROGRADE PYELOGRAM, URETEROSCOPY AND STENT PLACEMENT Right 01/31/2014   Procedure: CYSTOSCOPY WITH bilateral  RETROGRADE PYELOGRAM, right URETEROSCOPY AND right  STENT PLACEMENT, bladder biopsy with fulgeration;  Surgeon: Sebastian Acheheodore Manny, MD;  [facility];  Service: Urology;  Laterality: Right;  . HARDWARE REMOVAL Right 09/09/2014   Procedure: HARDWARE REMOVAL RIGHT SHOULDER;  Surgeon: Mable ParisJustin William Chandler, MD;  Location: [facility];  Service: Orthopedics;  Laterality: Right;  Right shoulder hardware removal  . HOLMIUM  LASER APPLICATION Right 01/31/2014   Procedure: HOLMIUM LASER APPLICATION;  Surgeon: Sebastian Acheheodore Manny, MD;  [facility];  Service: Urology;  Laterality: Right;  . HUMERUS IM NAIL Right 06/10/2014   Procedure: INTRAMEDULLARY (IM) NAIL HUMERAL;  Surgeon: Mable ParisJustin William Chandler, MD;  [facility];  Service: Orthopedics;  Laterality: Right;  Right intramedullary humeral nail  . INTERSTIM IMPLANT PLACEMENT  05-10-12   now malfunctioning  . INTERSTIM IMPLANT REMOVAL  05/16/2012   Procedure: REMOVAL OF INTERSTIM IMPLANT;  Surgeon: Martina SinnerScott A MacDiarmid, MD;  [facility];  Service: Urology;  Laterality: N/A;  . OTHER SURGICAL HISTORY     Bladder Surgery  . SHOULDER SURGERY    . TOTAL KNEE ARTHROPLASTY Right 02/17/2015   Procedure: TOTAL KNEE ARTHROPLASTY;  Surgeon: Gean BirchwoodFrank Rowan, MD;  [facility];  Service: Orthopedics;  Laterality: Right;   Social History   Occupational History  . Not on file.   Social History Main Topics  . Smoking status: Never Smoker  .  Smokeless tobacco: Never Used  . Alcohol use No  . Drug use: No  . Sexual activity: No     Garald Balding, MD   Note - This record has been created using [employer].  Chart creation errors have been sought, but may not always  have been located. Such creation errors do not reflect on  the standard of medical care.

## 2017-03-17 ENCOUNTER — Encounter (INDEPENDENT_AMBULATORY_CARE_PROVIDER_SITE_OTHER): Payer: Self-pay | Admitting: Orthopaedic Surgery

## 2017-03-21 ENCOUNTER — Other Ambulatory Visit: Payer: Self-pay | Admitting: Cardiovascular Disease

## 2017-03-24 ENCOUNTER — Encounter: Payer: Self-pay | Admitting: Orthopaedic Surgery

## 2017-03-24 DIAGNOSIS — S42201S Unspecified fracture of upper end of right humerus, sequela: Secondary | ICD-10-CM | POA: Diagnosis not present

## 2017-03-24 DIAGNOSIS — T8484XA Pain due to internal orthopedic prosthetic devices, implants and grafts, initial encounter: Secondary | ICD-10-CM | POA: Diagnosis not present

## 2017-03-30 ENCOUNTER — Encounter (INDEPENDENT_AMBULATORY_CARE_PROVIDER_SITE_OTHER): Payer: Self-pay | Admitting: Orthopedic Surgery

## 2017-03-30 ENCOUNTER — Ambulatory Visit (INDEPENDENT_AMBULATORY_CARE_PROVIDER_SITE_OTHER): Payer: Medicare Other | Admitting: Orthopedic Surgery

## 2017-03-30 DIAGNOSIS — S86811A Strain of other muscle(s) and tendon(s) at lower leg level, right leg, initial encounter: Secondary | ICD-10-CM

## 2017-03-30 DIAGNOSIS — Z9889 Other specified postprocedural states: Secondary | ICD-10-CM

## 2017-03-30 NOTE — Progress Notes (Signed)
Office Visit Note   Patient: Carrie Best           Date of Birth: 1928/04/27           MRN: [id number] Visit Date: 03/30/2017              Requested by: Kirby FunkGriffin, John, MD [address] [place], [place] [address] PCP: Kirby FunkGriffin, John, MD   Assessment & Plan: Visit Diagnoses:  1. S/P hardware removal   2. Patellar tendon rupture, right, initial encounter     Plan:  #1: Continue activities as tolerated for the right shoulder #2: Will discuss right patellar tendon rupture with Dr. Cleophas DunkerWhitfield which is chronic tearing back in 2016  Follow-Up Instructions: Return if symptoms worsen or fail to improve.   Orders:  No orders of the defined types were placed in this encounter.  No orders of the defined types were placed in this encounter.     Procedures: No procedures performed   Clinical Data: No additional findings.   Subjective: Chief Complaint  Patient presents with  . Routine Post Op    right shoulder sx thursday 03/24/17.  shoulder is doing great, no pain.     HPI  Carrie Best returns today for follow-up of her screw removal 6 days status post surgery. She states she has no pain at all now she's very happy with the results.  She does bring up a situation where in 2016 she had a total knee replacement done by Dr. Turner Danielsowan and apparently quickly after that about 6 weeks had a patellar tendon rupture. Nothing was done at that time. Dr. Marya Fossaolen initially was going to repair this but want her to return for follow-up he stated that he would not. [facility] and notes noted in the chart in Epic. It did not appear that they were interested in repairing it though the family stated that that was one of the options they gave them  Review of Systems   Objective: Vital Signs: There were no vitals taken for this visit.  Physical Exam  Ortho Exam  Exam today reveals the wound to be well-healed. No signs of infection. No pain.  Specialty  Comments:  No specialty comments available.  Imaging: No results found.   PMFS History: Patient Active Problem List   Diagnosis Date Noted  . 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 Rupture of right patellar tendon 05/19/2016  . H/O nonunion of fracture 03/16/2016  . Arthritis of knee 02/17/2015  . Primary osteoarthritis of right knee Valgus 02/14/2015  . Chronic diastolic CHF (congestive heart failure) (HCC) 02/06/2015  . Altered mental status 01/28/2015  . Proximal humerus fracture 06/10/2014  . Acute on chronic diastolic congestive heart failure (HCC) 03/18/2012  . Healthcare-associated pneumonia 03/18/2012  . Obesity 03/18/2012  . HTN (hypertension) 03/18/2012  . GERD (gastroesophageal reflux disease) 03/18/2012  . Hyperlipidemia 03/18/2012  . Sleep disturbance 03/18/2012  . Atrial fibrillation (HCC) 03/18/2012   Past Medical History:  Diagnosis Date  . Anxiety   . Arthritis   . Congestive heart failure (HCC)    Congestive heart failure symptoms  . Depression   . Diastolic dysfunction   . Dyspnea    occasionally, with exertion  . Dysrhythmia 05-10-12   hx. A. Fib., rate is controlled-tx. Xarelto  . GERD (gastroesophageal reflux disease) 05-10-12   tx. with meds as needed  . Hearing loss 05-10-12   bilateral hearing aids.  . Heart  murmur   . History of kidney stones   . Hyperlipidemia   . Hypertension   . Pneumonia 05-10-12   dx. in July- tx. antibiotics  . Stroke (HCC) 05-10-12   Lt.CVA note on scans-hx. freq. falls/ none past 6 months 01/21/14  . Wears hearing aid    bilateral    Family History  Problem Relation Age of Onset  . Stroke Mother   . Stroke Brother     Past Surgical History:  Procedure Laterality Date  . APPENDECTOMY    . CATARACT EXTRACTION, BILATERAL  05-10-12   bilateral  . CYSTOSCOPY WITH RETROGRADE PYELOGRAM, URETEROSCOPY AND STENT PLACEMENT Right 01/31/2014   Procedure: CYSTOSCOPY WITH bilateral  RETROGRADE PYELOGRAM, right  URETEROSCOPY AND right  STENT PLACEMENT, bladder biopsy with fulgeration;  Surgeon: Sebastian Acheheodore Manny, MD;  [facility];  Service: Urology;  Laterality: Right;  . HARDWARE REMOVAL Right 09/09/2014   Procedure: HARDWARE REMOVAL RIGHT SHOULDER;  Surgeon: Mable ParisJustin William Chandler, MD;  Location: [facility];  Service: Orthopedics;  Laterality: Right;  Right shoulder hardware removal  . HOLMIUM LASER APPLICATION Right 01/31/2014   Procedure: HOLMIUM LASER APPLICATION;  Surgeon: Sebastian Acheheodore Manny, MD;  [facility];  Service: Urology;  Laterality: Right;  . HUMERUS IM NAIL Right 06/10/2014   Procedure: INTRAMEDULLARY (IM) NAIL HUMERAL;  Surgeon: Mable ParisJustin William Chandler, MD;  [facility];  Service: Orthopedics;  Laterality: Right;  Right intramedullary humeral nail  . INTERSTIM IMPLANT PLACEMENT  05-10-12   now malfunctioning  . INTERSTIM IMPLANT REMOVAL  05/16/2012   Procedure: REMOVAL OF INTERSTIM IMPLANT;  Surgeon: Martina SinnerScott A MacDiarmid, MD;  [facility];  Service: Urology;  Laterality: N/A;  . OTHER SURGICAL HISTORY     Bladder Surgery  . SHOULDER SURGERY    . TOTAL KNEE ARTHROPLASTY Right 02/17/2015   Procedure: TOTAL KNEE ARTHROPLASTY;  Surgeon: Gean BirchwoodFrank Rowan, MD;  [facility];  Service: Orthopedics;  Laterality: Right;   Social History   Occupational History  . Not on file.   Social History Main Topics  . Smoking status: Never Smoker  . Smokeless tobacco: Never Used  . Alcohol use No  . Drug use: No  . Sexual activity: No

## 2017-04-14 ENCOUNTER — Telehealth (INDEPENDENT_AMBULATORY_CARE_PROVIDER_SITE_OTHER): Payer: Self-pay | Admitting: Orthopaedic Surgery

## 2017-04-14 NOTE — Telephone Encounter (Signed)
Patients daughter calling in reference to knee surgery. Carrie Best was to discuss surgery with Dr. Cleophas DunkerWhitfield. Daughter calling to check status of that. Please call to advise.

## 2017-04-14 NOTE — Telephone Encounter (Signed)
I do not think Mrs Carrie Best would do well with any knee surgery given the extent and chronicity of her problem. Did discuss with Carrie Best. Happy to discuss with family if desired

## 2017-04-14 NOTE — Telephone Encounter (Signed)
Please advise 

## 2017-04-15 NOTE — Telephone Encounter (Signed)
Spoke with daughter Jari PiggDebby and gave her the message from PW

## 2017-04-22 ENCOUNTER — Ambulatory Visit: Payer: Medicare Other | Admitting: Cardiovascular Disease

## 2017-05-31 ENCOUNTER — Ambulatory Visit (INDEPENDENT_AMBULATORY_CARE_PROVIDER_SITE_OTHER): Payer: Medicare Other | Admitting: Podiatry

## 2017-05-31 ENCOUNTER — Ambulatory Visit (INDEPENDENT_AMBULATORY_CARE_PROVIDER_SITE_OTHER): Payer: Medicare Other

## 2017-05-31 ENCOUNTER — Other Ambulatory Visit: Payer: Self-pay | Admitting: Podiatry

## 2017-05-31 DIAGNOSIS — S92324A Nondisplaced fracture of second metatarsal bone, right foot, initial encounter for closed fracture: Secondary | ICD-10-CM | POA: Diagnosis not present

## 2017-05-31 DIAGNOSIS — T1490XA Injury, unspecified, initial encounter: Secondary | ICD-10-CM

## 2017-05-31 NOTE — Progress Notes (Signed)
   Subjective:    Patient ID: Carrie Best, female    DOB: 28-Jul-1928, 83 y.o.   MRN: [id number]  HPI this patient presents the office with chief complaint of pain and swelling in her right foot.  She presents the office today for an evaluation and is accompanied by her daughter.  The patient says that she fell last Friday and her right foot was caught under her body.  She says that her foot became swollen and purplish following the injury.  This patient is not ambulatory and gets around in a wheelchair.  She presents the office today having no professional treatment afforded.  She presents the office today for an evaluation of her right foot.   Review of Systems  Constitutional: Positive for fatigue.  HENT: Positive for hearing loss.   Cardiovascular:       Calf pain with walking  Musculoskeletal: Positive for gait problem.       Muscle pain  Skin:       Thick scabs  Neurological: Positive for headaches.  Hematological: Bruises/bleeds easily.       Objective:   Physical Exam General Appearance  Alert, conversant and in no acute stress.  Vascular  Dorsalis pedis and posterior pulses are weakly  palpable  Left foot.  DP and PT are non palpable right foot due to swelling.  Capillary return is within normal limits  Bilaterally. Temperature is within normal limits  Bilaterally  Neurologic  Senn-Weinstein monofilament wire test within normal limits  bilaterally. Muscle power  Within normal limits bilaterally.  Nails Thin non-infected nails both feet. No evidence of bacterial infection or drainage bilaterally.  Orthopedic  No limitations of motion of motion feet bilaterally.  No crepitus or effusions noted.  No bony pathology or digital deformities noted. Evaluation of the right foot does reveal ecchymosis noted over the second and third and fourth metatarsals right foot.  There is increased temperature noted in the right foot compared to the left foot.  Palpable pain noted at the base of  the first and second metatarsals right foot.    Skin  normotropic skin with no porokeratosis noted bilaterally.  No signs of infections or ulcers noted.          Assessment & Plan:  Foot Sprain right foot.  Liz-frank fracture base second metatarsal right foot.  \IE  Xrays taken reveal no bony pathology left foot.  There is a fleck of bone noted base second metatarsal right foot.  The alignment of the base of the other metatarsals are  to be nondisplaced. The fleck of bone indicates an injury at the medial aspect of the first and second meta-bases.  There is no significant displacement or bony fractures noted on the right foot.  Since this patient is nonweightbearing and in a wheelchair. I have chosen to treat her clinical symptoms.  I proceeded to dispense an anklet for her to wear on her right foot.  I also recommended she keep her right foot elevated to help reduce his swelling.  She is told to call the office if any problems occur.   Helane Gunther DPM

## 2017-05-31 NOTE — Progress Notes (Signed)
   Subjective:    Patient ID: Carrie Best, female    DOB: [birth year], 83 y.o.   MRN: [id number]  HPI    Review of Systems  All other systems reviewed and are negative.      Objective:   Physical Exam        Assessment & Plan:

## 2017-06-01 ENCOUNTER — Ambulatory Visit (INDEPENDENT_AMBULATORY_CARE_PROVIDER_SITE_OTHER): Payer: Medicare Other

## 2017-06-01 DIAGNOSIS — S92324A Nondisplaced fracture of second metatarsal bone, right foot, initial encounter for closed fracture: Secondary | ICD-10-CM | POA: Diagnosis not present

## 2017-06-01 NOTE — Addendum Note (Signed)
Addended byMaury Dus, Kennis Buell L on: 06/01/2017 08:27 AM   Modules accepted: Orders

## 2017-06-06 ENCOUNTER — Emergency Department (HOSPITAL_COMMUNITY): Payer: Medicare Other

## 2017-06-06 ENCOUNTER — Emergency Department (HOSPITAL_COMMUNITY)
Admission: EM | Admit: 2017-06-06 | Discharge: 2017-06-06 | Disposition: A | Discharge status: Home / Self Care | Payer: Medicare Other | Attending: Emergency Medicine | Admitting: Emergency Medicine

## 2017-06-06 ENCOUNTER — Encounter (HOSPITAL_COMMUNITY): Payer: Self-pay | Admitting: *Deleted

## 2017-06-06 DIAGNOSIS — I110 Hypertensive heart disease with heart failure: Secondary | ICD-10-CM | POA: Insufficient documentation

## 2017-06-06 DIAGNOSIS — Z79899 Other long term (current) drug therapy: Secondary | ICD-10-CM | POA: Insufficient documentation

## 2017-06-06 DIAGNOSIS — W19XXXA Unspecified fall, initial encounter: Secondary | ICD-10-CM

## 2017-06-06 DIAGNOSIS — M25511 Pain in right shoulder: Secondary | ICD-10-CM

## 2017-06-06 DIAGNOSIS — Z791 Long term (current) use of non-steroidal anti-inflammatories (NSAID): Secondary | ICD-10-CM | POA: Diagnosis not present

## 2017-06-06 DIAGNOSIS — I5032 Chronic diastolic (congestive) heart failure: Secondary | ICD-10-CM | POA: Diagnosis not present

## 2017-06-06 NOTE — ED Triage Notes (Signed)
Pt has had 3 falls in the last 7 days and she last fell on Saturday and hurt her right shoulder, she does have pain hen she raises it. She has previous broken foot and it makes her off balance.  She is not complaining of dizziness. She said most of her problems occurs since she had her knee replaced

## 2017-06-06 NOTE — ED Provider Notes (Signed)
[facility]Y [facility]PT Provider Note   CSN: [id number] Arrival date & time: 06/06/17  1518     History   Chief Complaint Chief Complaint  Patient presents with  . Fall  . Shoulder Pain    HPI Carrie Best is a 83 y.o. female.  HPI   Since knee surgery has had falls Not on blood thinners Larey Seat 9/22, then again 9/26. Broke foot on first fall, saw podiatrist.  On the 9/26 fell, hit head on carpet, hit right soulder. Has had persistent pain since fall.  Pain in right shoulder, severe, worse with movements.  Felt like breath got knocked out of her that day with fall but denies current dyspnea, chest pain or back pain. No numbness/weakness. No infectious symptoms. No headache or vomiting.  Shoulder pain since fall, 10/10 shoulder pain.  Not having pain in other locations other than foot from prior fall.   Past Medical History:  Diagnosis Date  . Anxiety   . Arthritis   . Congestive heart failure (HCC)    Congestive heart failure symptoms  . Depression   . Diastolic dysfunction   . Dyspnea    occasionally, with exertion  . Dysrhythmia 05-10-12   hx. A. Fib., rate is controlled-tx. Xarelto  . GERD (gastroesophageal reflux disease) 05-10-12   tx. with meds as needed  . Hearing loss 05-10-12   bilateral hearing aids.  . Heart murmur   . History of kidney stones   . Hyperlipidemia   . Hypertension   . Pneumonia 05-10-12   dx. in July- tx. antibiotics  . Stroke (HCC) 05-10-12   Lt.CVA note on scans-hx. freq. falls/ none past 6 months 01/21/14  . Wears hearing aid    bilateral    Patient Active Problem List   Diagnosis Date Noted  . 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 Rupture of right patellar tendon 05/19/2016  . H/O nonunion of fracture 03/16/2016  . Arthritis of knee 02/17/2015  . Primary osteoarthritis of right knee Valgus 02/14/2015  . Chronic diastolic CHF (congestive heart failure) (HCC) 02/06/2015  . Altered mental status 01/28/2015  . Proximal humerus  fracture 06/10/2014  . Acute on chronic diastolic congestive heart failure (HCC) 03/18/2012  . Healthcare-associated pneumonia 03/18/2012  . Obesity 03/18/2012  . HTN (hypertension) 03/18/2012  . GERD (gastroesophageal reflux disease) 03/18/2012  . Hyperlipidemia 03/18/2012  . Sleep disturbance 03/18/2012  . Atrial fibrillation (HCC) 03/18/2012    Past Surgical History:  Procedure Laterality Date  . APPENDECTOMY    . CATARACT EXTRACTION, BILATERAL  05-10-12   bilateral  . CYSTOSCOPY WITH RETROGRADE PYELOGRAM, URETEROSCOPY AND STENT PLACEMENT Right 01/31/2014   Procedure: CYSTOSCOPY WITH bilateral  RETROGRADE PYELOGRAM, right URETEROSCOPY AND right  STENT PLACEMENT, bladder biopsy with fulgeration;  Surgeon: Sebastian Ache, MD;  Location: [facility];  Service: Urology;  Laterality: Right;  . HARDWARE REMOVAL Right 09/09/2014   Procedure: HARDWARE REMOVAL RIGHT SHOULDER;  Surgeon: Mable Paris, MD;  Location: [facility];  Service: Orthopedics;  Laterality: Right;  Right shoulder hardware removal  . HOLMIUM LASER APPLICATION Right 01/31/2014   Procedure: HOLMIUM LASER APPLICATION;  Surgeon: Sebastian Ache, MD;  Location: [facility];  Service: Urology;  Laterality: Right;  . HUMERUS IM NAIL Right 06/10/2014   Procedure: INTRAMEDULLARY (IM) NAIL HUMERAL;  Surgeon: Mable Paris, MD;  Location: [facility];  Service: Orthopedics;  Laterality: Right;  Right intramedullary humeral nail  . INTERSTIM IMPLANT PLACEMENT  05-10-12   now malfunctioning  .  INTERSTIM IMPLANT REMOVAL  05/16/2012   Procedure: REMOVAL OF INTERSTIM IMPLANT;  Surgeon: Martina Sinner, MD;  Location: [facility];  Service: Urology;  Laterality: N/A;  . JOINT REPLACEMENT    . OTHER SURGICAL HISTORY     Bladder Surgery  . SHOULDER SURGERY    . TOTAL KNEE ARTHROPLASTY Right 02/17/2015   Procedure: TOTAL KNEE ARTHROPLASTY;  Surgeon: Gean Birchwood, MD;  Location: [facility];  Service: Orthopedics;  Laterality: Right;     OB History    No data available       Home Medications    Prior to Admission medications   Medication Sig Start Date End Date Taking? Authorizing Provider  atorvastatin (LIPITOR) 20 MG tablet Take 20 mg by mouth daily. 01/06/15  Yes [provider]  CARTIA XT 180 MG 24 hr capsule  11/01/16  Yes [provider]  DULoxetine (CYMBALTA) 60 MG capsule Take 60 mg by mouth every morning.    Yes [provider]  furosemide (LASIX) 40 MG tablet TAKE 1 TABLET(40 MG) BY MOUTH DAILY 03/21/17  Yes Bhagat, Bhavinkumar, PA  HYDROcodone-acetaminophen (NORCO/VICODIN) 5-325 MG tablet Take 1-2 tablets by mouth every 4 (four) hours as needed for moderate pain or severe pain. 12/10/15  Yes Pfeiffer, Lebron Conners, MD  ipratropium-albuterol (DUONEB) 0.5-2.5 (3) MG/3ML SOLN U 3 ML VIA NEB QID 10/20/15  Yes [provider]  LORazepam (ATIVAN) 0.5 MG tablet Take 0.5 mg by mouth every 8 (eight) hours as needed for anxiety.   Yes [provider]  meloxicam (MOBIC) 15 MG tablet Take 15 mg by mouth daily. 01/06/15  Yes [provider]  metoprolol tartrate (LOPRESSOR) 25 MG tablet Take 50 mg by mouth 2 (two) times daily.  03/23/12  Yes Kirby Funk, MD  potassium chloride SA (K-DUR,KLOR-CON) 20 MEQ tablet Take 1 tablet (20 mEq total) by mouth daily. 01/28/15 06/06/17 Yes Zannie Cove, MD  senna-docusate (SENOKOT-S) 8.6-50 MG per tablet Take 1 tablet by mouth 2 (two) times daily. While taking pain meds to prevent constipation Patient taking differently: Take 1 tablet by mouth 2 (two) times daily as needed for mild constipation. While taking pain meds to prevent constipation 01/31/14  Yes Sebastian Ache, MD  tizanidine (ZANAFLEX) 2 MG capsule Take 1 capsule (2 mg total) by mouth 3 (three) times daily as needed for muscle spasms. 02/18/15  Yes Gean Birchwood, MD  zolpidem (AMBIEN) 10 MG tablet Take 10 mg by mouth at bedtime. For sleep   Yes [provider]  DOCOSAHEXAENOIC  ACID PO Take 1 g by mouth.    [provider]    Family History Family History  Problem Relation Age of Onset  . Stroke Mother   . Stroke Brother     Social History Social History  Substance Use Topics  . Smoking status: Never Smoker  . Smokeless tobacco: Never Used  . Alcohol use No     Allergies   Codeine; Penicillins; and Sulfur   Review of Systems Review of Systems  Constitutional: Negative for fever.  HENT: Negative for sore throat.   Eyes: Negative for visual disturbance.  Respiratory: Negative for cough and shortness of breath.   Cardiovascular: Negative for chest pain.  Gastrointestinal: Negative for abdominal pain, nausea and vomiting.  Genitourinary: Negative for difficulty urinating and dysuria.  Musculoskeletal: Positive for arthralgias. Negative for back pain and neck pain.  Skin: Negative for rash.  Neurological: Negative for syncope, facial asymmetry, speech difficulty, weakness, numbness and headaches.  Physical Exam Updated Vital Signs BP (!) 171/69   Pulse 80   Temp 98.1 F (36.7 C) (Oral)   Resp 18   SpO2 98%   Physical Exam  Constitutional: She is oriented to person, place, and time. She appears well-developed and well-nourished. No distress.  HENT:  Head: Normocephalic and atraumatic.  Eyes: Conjunctivae and EOM are normal.  Neck: Normal range of motion.  Cardiovascular: Normal rate, regular rhythm, normal heart sounds and intact distal pulses.  Exam reveals no gallop and no friction rub.   No murmur heard. Pulmonary/Chest: Effort normal and breath sounds normal. No respiratory distress. She has no wheezes. She has no rales.  Abdominal: Soft. She exhibits no distension. There is no tenderness. There is no guarding.  Musculoskeletal: She exhibits no edema.       Right shoulder: She exhibits tenderness and bony tenderness.       Right elbow: She exhibits normal range of motion, no swelling and no effusion.  No C/T/L spine  tenderness No rib tenderness posterior or anterior  Neurological: She is alert and oriented to person, place, and time.  Skin: Skin is warm and dry. No rash noted. She is not diaphoretic. No erythema.  Nursing note and vitals reviewed.    ED Treatments / Results  Labs (all labs ordered are listed, but only abnormal results are displayed) Labs Reviewed - No data to display  EKG  EKG Interpretation None       Radiology Dg Shoulder Right  Result Date: 06/06/2017 CLINICAL DATA:  Right shoulder pain.  Multiple recent falls. EXAM: Right shoulder x-rays dated March 15, 2017. Right shoulder CT dated July 03, 2016. RIGHT SHOULDER - 2+ VIEW COMPARISON:  None. FINDINGS: Postsurgical changes related to prior intramedullary rod fixation of a right humeral neck fracture, which remains nonunited. No definite acute fracture. No dislocation. Slight varus alignment of the fracture is unchanged. Interval removal of the proximal interlocking screw. There is lucency about the proximal intramedullary rod. Moderate degenerative changes of the acromioclavicular joint. The bones are diffusely osteopenic. IMPRESSION: 1. Postsurgical changes related to prior intramedullary rod fixation of a right humeral neck fracture, which remains nonunited. No definite acute fracture or dislocation. 2. Interval removal of the proximal interlocking screw with new lucency about the proximal intramedullary rod, suggestive of loosening. Electronically Signed   By: Obie Dredge M.D.   On: 06/06/2017 17:07    Procedures Procedures (including critical care time)  Medications Ordered in ED Medications - No data to display   Initial Impression / Assessment and Plan / ED Course  I have reviewed the triage vital signs and the nursing notes.  Pertinent labs & imaging results that were available during my care of the patient were reviewed by me and considered in my medical decision making (see chart for details).     83 year old female presents with concern for fall on 9/26 with right shoulder pain.  Patient without headache or nausea vomiting, is awake, alert, no signs of head trauma, and have low suspicion for intracranial bleed this time. Discussed strict return precautions for head injury. She denies any other pain from fall other than pain in her right shoulder. Shoulder x-ray shows postsurgical changes from prior intramedullary fod fixation, with some suggestion of loosening of hardware.  Not clear if this is then chronic loosening of hardware present, or if it is acute. There is no sign of acute fracture. Patient was placed in a shoulder sling for comfort, and recommend follow-up with  her orthopedic provider. Also discussed that he may be secondary to rotator cuff pathology. She does not have any distal tenderness around the elbow or arm, and have low suspicion for elbow, wrist, or more distal humerus fracture. She is well appearing, hemodynamically stable. Reports has hx of falls since her prior knee surgery, denies acute infectious symptoms, no symptoms or signs to suggest acute CVA or MI.  Has rx for pain medications at home.  Patient discharged in stable condition with understanding of reasons to return.   Final Clinical Impressions(s) / ED Diagnoses   Final diagnoses:  Fall, initial encounter  Acute pain of right shoulder, hardware loosening, possible rotator cuff pathology    New Prescriptions Discharge Medication List as of 06/06/2017 10:16 PM       Alvira Monday, MD 06/07/17 [phone]

## 2017-06-06 NOTE — ED Notes (Signed)
Pt stable, states understanding of discharge instructions, family at bedside 

## 2017-06-06 NOTE — ED Notes (Signed)
Pt was asked to change into gown, Pt stated that she did not want to. Corky Crafts was left on Pts bed and Moshe Salisbury was in room when Pt refused gown.

## 2017-06-10 ENCOUNTER — Encounter (INDEPENDENT_AMBULATORY_CARE_PROVIDER_SITE_OTHER): Payer: Self-pay | Admitting: Orthopaedic Surgery

## 2017-06-10 ENCOUNTER — Ambulatory Visit (INDEPENDENT_AMBULATORY_CARE_PROVIDER_SITE_OTHER): Payer: Medicare Other | Admitting: Orthopaedic Surgery

## 2017-06-10 VITALS — BP 154/78 | HR 68 | Resp 12 | Ht 64.0 in | Wt 180.0 lb

## 2017-06-10 DIAGNOSIS — M25511 Pain in right shoulder: Secondary | ICD-10-CM | POA: Diagnosis not present

## 2017-06-10 NOTE — Progress Notes (Signed)
Office Visit Note   Patient: Carrie Best           Date of Birth: 1927-11-12           MRN: [id number] Visit Date: 06/10/2017              Requested by: Kirby Funk, MD [address] [place], [place] [address] PCP: Kirby Funk, MD   Assessment & Plan: Visit Diagnoses:  1. Acute pain of right shoulder     Plan: Return as needed. Urge family to perform passive motion of her shoulder to  maintain some function. She's been evaluated in the past for reverse shoulder arthroplasty. [facility] suggested that she not consider surgery. No evidence of fracture or roentgenographic injury to the right shoulder.  Follow-Up Instructions: Return if symptoms worsen or fail to improve.   Orders:  No orders of the defined types were placed in this encounter.  No orders of the defined types were placed in this encounter.     Procedures: No procedures performed   Clinical Data: No additional findings.   Subjective: Chief Complaint  Patient presents with  . Right Shoulder - Injury    [name] is an 83 y o that presents with Right shoulder pain after a multiple falls when transferring. She also fx of R ankle 05/27/17. Pt ambulates in a wheelchair.   HPI Mrs. Carrie Best is accompanied by her family member and here for evaluation of acute onset of right shoulder pain. She has fallen a number of times in the last month and sustained a fracture of her right second metatarsal being followed by the podiatrist. She fell on September 26 landing on her right shoulder. She's had an increased amount of pain. She was seen in the emergency room with x-rays negative for any obvious fracture. I no Mrs. Carrie Best from previous evaluations of her right shoulder. She's had been prior surgery for open reduction internal fixation of a fracture and is developed significant posttraumatic arthritis. She also has evidence of probable rotator cuff arthropathy. I removed a screw that had dislodged from the  proximal intramedullary nail several months ago it made a big difference in terms of her pain. She's had an exacerbation since she fell down. We brought her today just to be sure there were no "problems". Have not noticed any black and blue discoloration or swelling of her right shoulder. Apparently function has not changed. I did review her right shoulder films on the PACS system. I did not see any change in those that were performed in July. Specifically no evidence of a fracture. Review of Systems  Constitutional: Negative for chills, fatigue and fever.  HENT: Positive for hearing loss.   Eyes: Negative for itching.  Respiratory: Negative for chest tightness and shortness of breath.   Cardiovascular: Negative for chest pain, palpitations and leg swelling.  Gastrointestinal: Negative for blood in stool, constipation and diarrhea.  Endocrine: Negative for polyuria.  Genitourinary: Negative for dysuria.  Musculoskeletal: Negative for back pain, joint swelling, neck pain and neck stiffness.  Allergic/Immunologic: Negative for immunocompromised state.  Neurological: Positive for weakness. Negative for dizziness and numbness.  Hematological: Does not bruise/bleed easily.  Psychiatric/Behavioral: The patient is not nervous/anxious.      Objective: Vital Signs: BP (!) 154/78   Pulse 68   Resp 12   Ht  (1.626 m)   Wt 180 lb (81.6 kg)   BMI 30.90 kg/m   Physical Exam  Ortho Exam awake  and alert. No swelling or ecchymosis about her right shoulder. Some pain with that with abduction and flexion but really nothing that appeared to be different from prior evaluations. No OF infection. Good grip and good release. I was able to abduct her about 80 and flex about the same with some discomfort.  Specialty Comments:  No specialty comments available.  Imaging: No results found.   PMFS History: Patient Active Problem List   Diagnosis Date Noted  . Failed total knee arthroplasty (HCC)  05/19/2016  . Rupture of right patellar tendon 05/19/2016  . H/O nonunion of fracture 03/16/2016  . Arthritis of knee 02/17/2015  . Primary osteoarthritis of right knee Valgus 02/14/2015  . Chronic diastolic CHF (congestive heart failure) (HCC) 02/06/2015  . Altered mental status 01/28/2015  . Proximal humerus fracture 06/10/2014  . Acute on chronic diastolic congestive heart failure (HCC) 03/18/2012  . Healthcare-associated pneumonia 03/18/2012  . Obesity 03/18/2012  . HTN (hypertension) 03/18/2012  . GERD (gastroesophageal reflux disease) 03/18/2012  . Hyperlipidemia 03/18/2012  . Sleep disturbance 03/18/2012  . Atrial fibrillation (HCC) 03/18/2012   Past Medical History:  Diagnosis Date  . Anxiety   . Arthritis   . Congestive heart failure (HCC)    Congestive heart failure symptoms  . Depression   . Diastolic dysfunction   . Dyspnea    occasionally, with exertion  . Dysrhythmia 05-10-12   hx. A. Fib., rate is controlled-tx. Xarelto  . GERD (gastroesophageal reflux disease) 05-10-12   tx. with meds as needed  . Hearing loss 05-10-12   bilateral hearing aids.  . Heart murmur   . History of kidney stones   . Hyperlipidemia   . Hypertension   . Pneumonia 05-10-12   dx. in July- tx. antibiotics  . Stroke (HCC) 05-10-12   Lt.CVA note on scans-hx. freq. falls/ none past 6 months 01/21/14  . Wears hearing aid    bilateral    Family History  Problem Relation Age of Onset  . Stroke Mother   . Stroke Brother     Past Surgical History:  Procedure Laterality Date  . APPENDECTOMY    . CATARACT EXTRACTION, BILATERAL  05-10-12   bilateral  . CYSTOSCOPY WITH RETROGRADE PYELOGRAM, URETEROSCOPY AND STENT PLACEMENT Right 01/31/2014   Procedure: CYSTOSCOPY WITH bilateral  RETROGRADE PYELOGRAM, right URETEROSCOPY AND right  STENT PLACEMENT, bladder biopsy with fulgeration;  Surgeon: Sebastian Ache, MD;  Location: [facility];  Service: Urology;  Laterality: Right;  . HARDWARE REMOVAL Right 09/09/2014    Procedure: HARDWARE REMOVAL RIGHT SHOULDER;  Surgeon: Mable Paris, MD;  Location: [facility];  Service: Orthopedics;  Laterality: Right;  Right shoulder hardware removal  . HOLMIUM LASER APPLICATION Right 01/31/2014   Procedure: HOLMIUM LASER APPLICATION;  Surgeon: Sebastian Ache, MD;  Location: [facility];  Service: Urology;  Laterality: Right;  . HUMERUS IM NAIL Right 06/10/2014   Procedure: INTRAMEDULLARY (IM) NAIL HUMERAL;  Surgeon: Mable Paris, MD;  Location: [facility];  Service: Orthopedics;  Laterality: Right;  Right intramedullary humeral nail  . INTERSTIM IMPLANT PLACEMENT  05-10-12   now malfunctioning  . INTERSTIM IMPLANT REMOVAL  05/16/2012   Procedure: REMOVAL OF INTERSTIM IMPLANT;  Surgeon: Martina Sinner, MD;  Location: [facility];  Service: Urology;  Laterality: N/A;  . JOINT REPLACEMENT    . OTHER SURGICAL HISTORY     Bladder Surgery  . SHOULDER SURGERY    . TOTAL KNEE ARTHROPLASTY Right 02/17/2015   Procedure: TOTAL KNEE ARTHROPLASTY;  Surgeon: Gean Birchwood, MD;  Location: [facility];  Service: Orthopedics;  Laterality: Right;   Social History   Occupational History  . Not on file.   Social History Main Topics  . Smoking status: Never Smoker  . Smokeless tobacco: Never Used  . Alcohol use No  . Drug use: No  . Sexual activity: No

## 2017-06-14 ENCOUNTER — Other Ambulatory Visit: Payer: Self-pay | Admitting: Physician Assistant

## 2017-06-21 ENCOUNTER — Ambulatory Visit: Payer: Medicare Other | Admitting: Podiatry

## 2017-06-28 ENCOUNTER — Encounter: Payer: Self-pay | Admitting: Cardiovascular Disease

## 2017-06-28 ENCOUNTER — Ambulatory Visit (INDEPENDENT_AMBULATORY_CARE_PROVIDER_SITE_OTHER): Payer: Medicare Other | Admitting: Cardiovascular Disease

## 2017-06-28 VITALS — BP 140/70 | HR 72 | Ht 64.0 in

## 2017-06-28 DIAGNOSIS — I5032 Chronic diastolic (congestive) heart failure: Secondary | ICD-10-CM

## 2017-06-28 DIAGNOSIS — I482 Chronic atrial fibrillation, unspecified: Secondary | ICD-10-CM

## 2017-06-28 NOTE — Progress Notes (Signed)
Cardiology Office Note   Date:  06/28/2017   ID:  Carrie Best, DOB 1928/04/27, MRN [id number]  PCP:  Kirby Funk, MD  Cardiologist:   Kristeen Miss, MD   Chief Complaint  Patient presents with  . Congestive Heart Failure   1. Atrial fibrillation 2. Hypertension 3. Dementia 4. Diastolic dysfunction 5. Hyperlipidemia  History of Present Illness:  Carrie Best is an 83 yo who I have seen in the past. She was admitted with respiratory failure last year. She was found to She has developed some dementia. She has had frequent episodes of dyspnea. Typically with exertion. She does not eat any extra salt.   She still feels poorly - has good days and bad days   She is not able to walk any distance primarily due to leg weakness and leg pain.   Jan 16, 2014:  Carrie Best is doing oK. Able to do her normal activies with minimal dyspnea ( she does not do much).     February 06, 2015:  Carrie Best is a 83 y.o. female who presents for her  Atrial fib and chronic diastolic congestive heart failure. She was recently hospitalized for mental status changes ( hypersomulence)  No specific etiology was found   She needs to have knee surgery.   Will need to hold the Xarelto  February 06, 2016: Had her knee surgery last year but it was not successful.   Now she is wheelchair buond. Wants to have shoulder surgery now   Oct. 23, 2018:    Had shoulder surgery .   Had some complications apparently . Had right knee surgery that did not go well.    Past Medical History:  Diagnosis Date  . Anxiety   . Arthritis   . Congestive heart failure (HCC)    Congestive heart failure symptoms  . Depression   . Diastolic dysfunction   . Dyspnea    occasionally, with exertion  . Dysrhythmia 05-10-12   hx. A. Fib., rate is controlled-tx. Xarelto  . GERD (gastroesophageal reflux disease) 05-10-12   tx. with meds as needed  . Hearing loss 05-10-12   bilateral hearing aids.  . Heart murmur     . History of kidney stones   . Hyperlipidemia   . Hypertension   . Pneumonia 05-10-12   dx. in July- tx. antibiotics  . Stroke (HCC) 05-10-12   Lt.CVA note on scans-hx. freq. falls/ none past 6 months 01/21/14  . Wears hearing aid    bilateral    Past Surgical History:  Procedure Laterality Date  . APPENDECTOMY    . CATARACT EXTRACTION, BILATERAL  05-10-12   bilateral  . CYSTOSCOPY WITH RETROGRADE PYELOGRAM, URETEROSCOPY AND STENT PLACEMENT Right 01/31/2014   Procedure: CYSTOSCOPY WITH bilateral  RETROGRADE PYELOGRAM, right URETEROSCOPY AND right  STENT PLACEMENT, bladder biopsy with fulgeration;  Surgeon: Sebastian Ache, MD;  Location: [facility];  Service: Urology;  Laterality: Right;  . HARDWARE REMOVAL Right 09/09/2014   Procedure: HARDWARE REMOVAL RIGHT SHOULDER;  Surgeon: Mable Paris, MD;  Location: [facility];  Service: Orthopedics;  Laterality: Right;  Right shoulder hardware removal  . HOLMIUM LASER APPLICATION Right 01/31/2014   Procedure: HOLMIUM LASER APPLICATION;  Surgeon: Sebastian Ache, MD;  Location: [facility];  Service: Urology;  Laterality: Right;  . HUMERUS IM NAIL Right 06/10/2014   Procedure: INTRAMEDULLARY (IM) NAIL HUMERAL;  Surgeon: Mable Paris, MD;  Location: [facility];  Service: Orthopedics;  Laterality: Right;  Right  intramedullary humeral nail  . INTERSTIM IMPLANT PLACEMENT  05-10-12   now malfunctioning  . INTERSTIM IMPLANT REMOVAL  05/16/2012   Procedure: REMOVAL OF INTERSTIM IMPLANT;  Surgeon: Martina SinnerScott A MacDiarmid, MD;  Location: [facility];  Service: Urology;  Laterality: N/A;  . JOINT REPLACEMENT    . OTHER SURGICAL HISTORY     Bladder Surgery  . SHOULDER SURGERY    . TOTAL KNEE ARTHROPLASTY Right 02/17/2015   Procedure: TOTAL KNEE ARTHROPLASTY;  Surgeon: Gean BirchwoodFrank Rowan, MD;  Location: [facility];  Service: Orthopedics;  Laterality: Right;     Current Outpatient Prescriptions  Medication Sig Dispense Refill  . atorvastatin (LIPITOR) 20 MG tablet  Take 20 mg by mouth daily.    Marland Kitchen. CARTIA XT 180 MG 24 hr capsule     . DOCOSAHEXAENOIC ACID PO Take 1 g by mouth.    . DULoxetine (CYMBALTA) 60 MG capsule Take 60 mg by mouth every morning.     . furosemide (LASIX) 40 MG tablet TAKE 1 TABLET(40 MG) BY MOUTH DAILY 90 tablet 2  . HYDROcodone-acetaminophen (NORCO/VICODIN) 5-325 MG tablet Take 1-2 tablets by mouth every 4 (four) hours as needed for moderate pain or severe pain. 20 tablet 0  . ipratropium-albuterol (DUONEB) 0.5-2.5 (3) MG/3ML SOLN Take 3 mLs by nebulization.    Marland Kitchen. LORazepam (ATIVAN) 0.5 MG tablet Take 0.5 mg by mouth every 8 (eight) hours as needed for anxiety.    . meloxicam (MOBIC) 15 MG tablet Take 15 mg by mouth daily.    . metoprolol tartrate (LOPRESSOR) 25 MG tablet Take 50 mg by mouth 2 (two) times daily.     Marland Kitchen. senna-docusate (SENOKOT-S) 8.6-50 MG per tablet Take 1 tablet by mouth 2 (two) times daily. While taking pain meds to prevent constipation (Patient taking differently: Take 1 tablet by mouth 2 (two) times daily as needed for mild constipation. While taking pain meds to prevent constipation) 30 tablet 0  . tizanidine (ZANAFLEX) 2 MG capsule Take 1 capsule (2 mg total) by mouth 3 (three) times daily as needed for muscle spasms. 60 capsule 0  . zolpidem (AMBIEN) 10 MG tablet Take 10 mg by mouth at bedtime. For sleep    . potassium chloride SA (K-DUR,KLOR-CON) 20 MEQ tablet Take 1 tablet (20 mEq total) by mouth daily. 30 tablet 11   No current facility-administered medications for this visit.     Allergies:   Codeine; Penicillins; and Sulfur    Social History:  The patient  reports that she has never smoked. She has never used smokeless tobacco. She reports that she does not drink alcohol or use drugs.   Family History:  The patient's family history includes Stroke in her brother and mother.    ROS:  Please see the history of present illness.    Review of Systems: Constitutional:  denies fever, chills, diaphoresis,  appetite change and fatigue.  HEENT: denies photophobia, eye pain, redness, hearing loss, ear pain, congestion, sore throat, rhinorrhea, sneezing, neck pain, neck stiffness and tinnitus.  Respiratory: denies SOB, DOE, cough, chest tightness, and wheezing.  Cardiovascular: denies chest pain, palpitations and leg swelling.  Gastrointestinal: denies nausea, vomiting, abdominal pain, diarrhea, constipation, blood in stool.  Genitourinary: denies dysuria, urgency, frequency, hematuria, flank pain and difficulty urinating.  Musculoskeletal: denies  myalgias, back pain, joint swelling, arthralgias and gait problem.   Skin: denies pallor, rash and wound.  Neurological: denies dizziness, seizures, syncope, weakness, light-headedness, numbness and headaches.   Hematological: denies adenopathy, easy bruising, personal or  family bleeding history.  Psychiatric/ Behavioral: denies suicidal ideation, mood changes, confusion, nervousness, sleep disturbance and agitation.       All other systems are reviewed and negative.   Physical Exam: Blood pressure 140/70, pulse 72, height 5\' 4"  (1.626 m), SpO2 93 %.  GEN:  Well nourished, well developed in no acute distress HEENT: Normal NECK: No JVD; No carotid bruits LYMPHATICS: No lymphadenopathy CARDIAC:  Irreg. Irreg.  no murmurs, rubs, gallops RESPIRATORY:  Clear to auscultation without rales, wheezing or rhonchi  ABDOMEN: Soft, non-tender, non-distended MUSCULOSKELETAL:  Right knee deformity  SKIN: Warm and dry NEUROLOGIC:  Demented.   Was examined in wheelchair     EKG:  EKG is not ordered today.    Recent Labs: No results found for requested labs within last 8760 hours.    Lipid Panel No results found for: CHOL, TRIG, HDL, CHOLHDL, VLDL, LDLCALC, LDLDIRECT    Wt Readings from Last 3 Encounters:  06/10/17 180 lb (81.6 kg)  03/15/17 180 lb (81.6 kg)  11/29/16 170 lb (77.1 kg)      Other studies Reviewed: Additional studies/ records  that were reviewed today include: . Review of the above records demonstrates:   ASSESSMENT AND PLAN:  1. Atrial fibrillation - chronic , stable   2. Hypertension -  BP is well controlled.   3. Dementia - daughter came with her today for appt.   4. Chronic Diastolic dysfunction:   Doing well   5. Hyperlipidemia : managed by Dr. Valentina Lucks    Current medicines are reviewed at length with the patient today.  The patient does not have concerns regarding medicines.  The following changes have been made:  no change  Labs/ tests ordered today include:  No orders of the defined types were placed in this encounter.    Disposition:   FU with me in 1 year      Kristeen Miss, MD  06/28/2017 4:00 PM    [facility] [address] [place], [place], [place]  [address] Phone: [phone]; Fax: [phone]

## 2017-06-28 NOTE — Patient Instructions (Signed)
Medication Instructions:  Your physician recommends that you continue on your current medications as directed. Please refer to the Current Medication list given to you today.   Labwork: None Ordered   Testing/Procedures: None Ordered   Follow-Up: Your physician wants you to follow-up in: 1 year with Dr. Nahser.  You will receive a reminder letter in the mail two months in advance. If you don't receive a letter, please call our office to schedule the follow-up appointment.   If you need a refill on your cardiac medications before your next appointment, please call your pharmacy.   Thank you for choosing [facility]! Breydan Shillingburg, RN [phone]    

## 2017-07-01 ENCOUNTER — Ambulatory Visit: Payer: Medicare Other | Admitting: Podiatry

## 2017-07-12 ENCOUNTER — Telehealth: Payer: Self-pay | Admitting: Cardiovascular Disease

## 2017-07-12 NOTE — Telephone Encounter (Signed)
Patient aware Disability Parking Placard signed and ready. Patient request it to be mailed to her home address. Placed in mail today.

## 2017-12-24 ENCOUNTER — Emergency Department (HOSPITAL_COMMUNITY): Payer: Medicare Other

## 2017-12-24 ENCOUNTER — Observation Stay (HOSPITAL_COMMUNITY)
Admission: EM | Admit: 2017-12-24 | Discharge: 2017-12-25 | Disposition: A | Discharge status: Home / Self Care | Payer: Medicare Other | Attending: Internal Medicine | Admitting: Internal Medicine

## 2017-12-24 ENCOUNTER — Encounter (HOSPITAL_COMMUNITY): Payer: Self-pay | Admitting: Emergency Medicine

## 2017-12-24 DIAGNOSIS — F419 Anxiety disorder, unspecified: Secondary | ICD-10-CM | POA: Insufficient documentation

## 2017-12-24 DIAGNOSIS — F329 Major depressive disorder, single episode, unspecified: Secondary | ICD-10-CM | POA: Diagnosis not present

## 2017-12-24 DIAGNOSIS — E669 Obesity, unspecified: Secondary | ICD-10-CM | POA: Diagnosis not present

## 2017-12-24 DIAGNOSIS — M1990 Unspecified osteoarthritis, unspecified site: Secondary | ICD-10-CM | POA: Diagnosis not present

## 2017-12-24 DIAGNOSIS — F0390 Unspecified dementia without behavioral disturbance: Secondary | ICD-10-CM | POA: Insufficient documentation

## 2017-12-24 DIAGNOSIS — K219 Gastro-esophageal reflux disease without esophagitis: Secondary | ICD-10-CM | POA: Diagnosis not present

## 2017-12-24 DIAGNOSIS — I482 Chronic atrial fibrillation, unspecified: Secondary | ICD-10-CM | POA: Diagnosis present

## 2017-12-24 DIAGNOSIS — Z993 Dependence on wheelchair: Secondary | ICD-10-CM | POA: Insufficient documentation

## 2017-12-24 DIAGNOSIS — I110 Hypertensive heart disease with heart failure: Principal | ICD-10-CM | POA: Insufficient documentation

## 2017-12-24 DIAGNOSIS — I447 Left bundle-branch block, unspecified: Secondary | ICD-10-CM | POA: Diagnosis not present

## 2017-12-24 DIAGNOSIS — I5033 Acute on chronic diastolic (congestive) heart failure: Secondary | ICD-10-CM | POA: Insufficient documentation

## 2017-12-24 DIAGNOSIS — I481 Persistent atrial fibrillation: Secondary | ICD-10-CM | POA: Diagnosis not present

## 2017-12-24 DIAGNOSIS — I10 Essential (primary) hypertension: Secondary | ICD-10-CM | POA: Diagnosis present

## 2017-12-24 DIAGNOSIS — Z6827 Body mass index (BMI) 27.0-27.9, adult: Secondary | ICD-10-CM | POA: Insufficient documentation

## 2017-12-24 DIAGNOSIS — Z8673 Personal history of transient ischemic attack (TIA), and cerebral infarction without residual deficits: Secondary | ICD-10-CM | POA: Insufficient documentation

## 2017-12-24 DIAGNOSIS — E876 Hypokalemia: Secondary | ICD-10-CM | POA: Insufficient documentation

## 2017-12-24 DIAGNOSIS — I5031 Acute diastolic (congestive) heart failure: Secondary | ICD-10-CM

## 2017-12-24 DIAGNOSIS — E785 Hyperlipidemia, unspecified: Secondary | ICD-10-CM | POA: Diagnosis not present

## 2017-12-24 DIAGNOSIS — I509 Heart failure, unspecified: Secondary | ICD-10-CM

## 2017-12-24 DIAGNOSIS — Z880 Allergy status to penicillin: Secondary | ICD-10-CM | POA: Diagnosis not present

## 2017-12-24 DIAGNOSIS — Z885 Allergy status to narcotic agent status: Secondary | ICD-10-CM | POA: Insufficient documentation

## 2017-12-24 DIAGNOSIS — J9621 Acute and chronic respiratory failure with hypoxia: Secondary | ICD-10-CM | POA: Insufficient documentation

## 2017-12-24 DIAGNOSIS — Z7901 Long term (current) use of anticoagulants: Secondary | ICD-10-CM | POA: Diagnosis not present

## 2017-12-24 DIAGNOSIS — I4891 Unspecified atrial fibrillation: Secondary | ICD-10-CM | POA: Diagnosis present

## 2017-12-24 LAB — BASIC METABOLIC PANEL
ANION GAP: 11 (ref 5–15)
BUN: 12 mg/dL (ref 6–20)
CHLORIDE: 100 mmol/L — AB (ref 101–111)
CO2: 27 mmol/L (ref 22–32)
Calcium: 10.4 mg/dL — ABNORMAL HIGH (ref 8.9–10.3)
Creatinine, Ser: 1 mg/dL (ref 0.44–1.00)
GFR calc Af Amer: 57 mL/min — ABNORMAL LOW (ref >60)
GFR calc non Af Amer: 49 mL/min — ABNORMAL LOW (ref >60)
GLUCOSE: 106 mg/dL — AB (ref 65–99)
POTASSIUM: 3.4 mmol/L — AB (ref 3.5–5.1)
Sodium: 138 mmol/L (ref 135–145)

## 2017-12-24 LAB — CBC
HEMATOCRIT: 39.3 % (ref 36.0–46.0)
HEMOGLOBIN: 13.4 g/dL (ref 12.0–15.0)
MCH: 30.5 pg (ref 26.0–34.0)
MCHC: 34.1 g/dL (ref 30.0–36.0)
MCV: 89.3 fL (ref 78.0–100.0)
Platelets: 192 10*3/uL (ref 150–400)
RBC: 4.4 MIL/uL (ref 3.87–5.11)
RDW: 14.6 % (ref 11.5–15.5)
WBC: 8.2 10*3/uL (ref 4.0–10.5)

## 2017-12-24 LAB — I-STAT TROPONIN, ED: TROPONIN I, POC: 0.01 ng/mL (ref 0.00–0.08)

## 2017-12-24 LAB — BRAIN NATRIURETIC PEPTIDE: B Natriuretic Peptide: 365.3 pg/mL — ABNORMAL HIGH (ref 0.0–100.0)

## 2017-12-24 MED ORDER — SODIUM CHLORIDE 0.9% FLUSH
3.0000 mL | Freq: Two times a day (BID) | INTRAVENOUS | Status: DC
  Administered 2017-12-25 (×2): 3 mL via INTRAVENOUS

## 2017-12-24 MED ORDER — NITROGLYCERIN 2 % TD OINT
1.0000 [in_us] | TOPICAL_OINTMENT | Freq: Four times a day (QID) | TRANSDERMAL | Status: DC
  Administered 2017-12-24: 1 [in_us] via TOPICAL
  Filled 2017-12-24: qty 1

## 2017-12-24 MED ORDER — TIZANIDINE HCL 2 MG PO TABS: 2.0000 mg | ORAL_TABLET | Freq: Three times a day (TID) | ORAL | Status: DC | PRN

## 2017-12-24 MED ORDER — FUROSEMIDE 10 MG/ML IJ SOLN
40.0000 mg | Freq: Once | INTRAMUSCULAR | Status: AC
  Administered 2017-12-24: 40 mg via INTRAVENOUS
  Filled 2017-12-24: qty 4

## 2017-12-24 MED ORDER — DILTIAZEM HCL ER COATED BEADS 180 MG PO CP24
180.0000 mg | ORAL_CAPSULE | Freq: Every day | ORAL | Status: DC
  Administered 2017-12-25: 180 mg via ORAL
  Filled 2017-12-24: qty 1

## 2017-12-24 MED ORDER — ALBUTEROL SULFATE (2.5 MG/3ML) 0.083% IN NEBU: 5.0000 mg | INHALATION_SOLUTION | Freq: Once | RESPIRATORY_TRACT | Status: DC

## 2017-12-24 MED ORDER — ATORVASTATIN CALCIUM 20 MG PO TABS
20.0000 mg | ORAL_TABLET | Freq: Every day | ORAL | Status: DC
  Administered 2017-12-25 (×2): 20 mg via ORAL
  Filled 2017-12-24 (×3): qty 1

## 2017-12-24 MED ORDER — RIVAROXABAN 20 MG PO TABS
20.0000 mg | ORAL_TABLET | Freq: Every day | ORAL | Status: DC
  Administered 2017-12-25: 20 mg via ORAL
  Filled 2017-12-24: qty 1

## 2017-12-24 MED ORDER — DULOXETINE HCL 60 MG PO CPEP
60.0000 mg | ORAL_CAPSULE | Freq: Every morning | ORAL | Status: DC
  Administered 2017-12-25: 60 mg via ORAL
  Filled 2017-12-24: qty 1

## 2017-12-24 MED ORDER — LISINOPRIL 5 MG PO TABS
5.0000 mg | ORAL_TABLET | Freq: Every day | ORAL | Status: DC
  Administered 2017-12-25 (×2): 5 mg via ORAL
  Filled 2017-12-24 (×2): qty 1

## 2017-12-24 MED ORDER — SODIUM CHLORIDE 0.9% FLUSH: 3.0000 mL | INTRAVENOUS | Status: DC | PRN

## 2017-12-24 MED ORDER — SENNOSIDES-DOCUSATE SODIUM 8.6-50 MG PO TABS: 1.0000 | ORAL_TABLET | Freq: Two times a day (BID) | ORAL | Status: DC | PRN

## 2017-12-24 MED ORDER — POTASSIUM CHLORIDE 20 MEQ PO PACK
40.0000 meq | PACK | Freq: Once | ORAL | Status: DC
  Filled 2017-12-24: qty 2

## 2017-12-24 MED ORDER — POTASSIUM CHLORIDE CRYS ER 20 MEQ PO TBCR
20.0000 meq | EXTENDED_RELEASE_TABLET | Freq: Every day | ORAL | Status: DC
  Filled 2017-12-24: qty 1

## 2017-12-24 MED ORDER — SODIUM CHLORIDE 0.9 % IV SOLN
250.0000 mL | INTRAVENOUS | Status: DC | PRN
Start: 2017-12-24 — End: 2017-12-25

## 2017-12-24 MED ORDER — METOPROLOL TARTRATE 50 MG PO TABS
50.0000 mg | ORAL_TABLET | Freq: Two times a day (BID) | ORAL | Status: DC
  Administered 2017-12-25 (×2): 50 mg via ORAL
  Filled 2017-12-24 (×2): qty 1

## 2017-12-24 MED ORDER — LORAZEPAM 0.5 MG PO TABS: 0.5000 mg | ORAL_TABLET | Freq: Three times a day (TID) | ORAL | Status: DC | PRN

## 2017-12-24 MED ORDER — FUROSEMIDE 10 MG/ML IJ SOLN
20.0000 mg | Freq: Once | INTRAMUSCULAR | Status: DC
  Filled 2017-12-24: qty 2

## 2017-12-24 MED ORDER — ZOLPIDEM TARTRATE 5 MG PO TABS
5.0000 mg | ORAL_TABLET | Freq: Every evening | ORAL | Status: DC | PRN
  Administered 2017-12-25: 5 mg via ORAL
  Filled 2017-12-24: qty 1

## 2017-12-24 NOTE — ED Provider Notes (Signed)
[facility] EMERGENCY DEPARTMENT Provider Note   CSN: [id number] Arrival date & time: 12/24/17  1729   Level 5 caveat: Dementia  History   Chief Complaint Shortness of breath  HPI Carrie Best is a 84 y.o. female.  HPI Patient presented to the emergency room for complaints of increasing shortness of breath over the last week.  History was obtained from the medical records as well as the patient.  According to medical records patient has a history of congestive heart failure.  Patient indicates that she is been gradually getting more short of breath of the past week.  Any minimal activity such as getting up and walking across the room is making her very short of breath.  Patient states she was trying to get out of bed today and she felt like she could not breathe.  She denies any chest pain.  When EMS arrived they found her oxygen saturation was 90%.  She was started on nasal cannula oxygen and felt much better.  Patient denies any fevers or chills.  No cough.  She has not noticed any leg swelling. Past Medical History:  Diagnosis Date  . Anxiety   . Arthritis   . Congestive heart failure (HCC)    Congestive heart failure symptoms  . Depression   . Diastolic dysfunction   . Dyspnea    occasionally, with exertion  . Dysrhythmia 05-10-12   hx. A. Fib., rate is controlled-tx. Xarelto  . GERD (gastroesophageal reflux disease) 05-10-12   tx. with meds as needed  . Hearing loss 05-10-12   bilateral hearing aids.  . Heart murmur   . History of kidney stones   . Hyperlipidemia   . Hypertension   . Pneumonia 05-10-12   dx. in July- tx. antibiotics  . Stroke (HCC) 05-10-12   Lt.CVA note on scans-hx. freq. falls/ none past 6 months 01/21/14  . Wears hearing aid    bilateral    Patient Active Problem List   Diagnosis Date Noted  . 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 Rupture of right patellar tendon 05/19/2016  . H/O nonunion of fracture 03/16/2016  .  Arthritis of knee 02/17/2015  . Primary osteoarthritis of right knee Valgus 02/14/2015  . Chronic diastolic CHF (congestive heart failure) (HCC) 02/06/2015  . Altered mental status 01/28/2015  . Proximal humerus fracture 06/10/2014  . Acute on chronic diastolic congestive heart failure (HCC) 03/18/2012  . Healthcare-associated pneumonia 03/18/2012  . Obesity 03/18/2012  . HTN (hypertension) 03/18/2012  . GERD (gastroesophageal reflux disease) 03/18/2012  . Hyperlipidemia 03/18/2012  . Sleep disturbance 03/18/2012  . Atrial fibrillation (HCC) 03/18/2012    Past Surgical History:  Procedure Laterality Date  . APPENDECTOMY    . CATARACT EXTRACTION, BILATERAL  05-10-12   bilateral  . CYSTOSCOPY WITH RETROGRADE PYELOGRAM, URETEROSCOPY AND STENT PLACEMENT Right 01/31/2014   Procedure: CYSTOSCOPY WITH bilateral  RETROGRADE PYELOGRAM, right URETEROSCOPY AND right  STENT PLACEMENT, bladder biopsy with fulgeration;  Surgeon: Sebastian Ache, MD;  Location: [facility];  Service: Urology;  Laterality: Right;  . HARDWARE REMOVAL Right 09/09/2014   Procedure: HARDWARE REMOVAL RIGHT SHOULDER;  Surgeon: Mable Paris, MD;  Location: [facility];  Service: Orthopedics;  Laterality: Right;  Right shoulder hardware removal  . HOLMIUM LASER APPLICATION Right 01/31/2014   Procedure: HOLMIUM LASER APPLICATION;  Surgeon: Sebastian Ache, MD;  Location: [facility];  Service: Urology;  Laterality: Right;  . HUMERUS IM NAIL Right 06/10/2014   Procedure: INTRAMEDULLARY (  IM) NAIL HUMERAL;  Surgeon: Mable ParisJustin William Chandler, MD;  [facility];  Service: Orthopedics;  Laterality: Right;  Right intramedullary humeral nail  . INTERSTIM IMPLANT PLACEMENT  05-10-12   now malfunctioning  . INTERSTIM IMPLANT REMOVAL  05/16/2012   Procedure: REMOVAL OF INTERSTIM IMPLANT;  Surgeon: Martina SinnerScott A MacDiarmid, MD;  Location: [facility];  Service: Urology;  Laterality: N/A;  . JOINT REPLACEMENT    . OTHER SURGICAL HISTORY      Bladder Surgery  . SHOULDER SURGERY    . TOTAL KNEE ARTHROPLASTY Right 02/17/2015   Procedure: TOTAL KNEE ARTHROPLASTY;  Surgeon: Gean BirchwoodFrank Rowan, MD;  Location: [facility];  Service: Orthopedics;  Laterality: Right;     OB History   None      Home Medications    Prior to Admission medications   Medication Sig Start Date End Date Taking? Authorizing Provider  atorvastatin (LIPITOR) 20 MG tablet Take 20 mg by mouth daily. 01/06/15  Yes [provider]  CARTIA XT 180 MG 24 hr capsule  11/01/16  Yes [provider]  DOCOSAHEXAENOIC ACID PO Take 1 g by mouth.   Yes [provider]  DULoxetine (CYMBALTA) 60 MG capsule Take 60 mg by mouth every morning.    Yes [provider]  furosemide (LASIX) 40 MG tablet TAKE 1 TABLET(40 MG) BY MOUTH DAILY 06/14/17  Yes Nahser, Deloris PingPhilip J, MD  LORazepam (ATIVAN) 0.5 MG tablet Take 0.5 mg by mouth every 8 (eight) hours as needed for anxiety.   Yes [provider]  meloxicam (MOBIC) 15 MG tablet Take 15 mg by mouth daily. 01/06/15  Yes [provider]  metoprolol tartrate (LOPRESSOR) 25 MG tablet Take 50 mg by mouth 2 (two) times daily.  03/23/12  Yes Kirby FunkGriffin, John, MD  potassium chloride SA (K-DUR,KLOR-CON) 20 MEQ tablet Take 1 tablet (20 mEq total) by mouth daily. 01/28/15 12/24/17 Yes Zannie CoveJoseph, Preetha, MD  senna-docusate (SENOKOT-S) 8.6-50 MG per tablet Take 1 tablet by mouth 2 (two) times daily. While taking pain meds to prevent constipation Patient taking differently: Take 1 tablet by mouth 2 (two) times daily as needed for mild constipation. While taking pain meds to prevent constipation 01/31/14  Yes Sebastian AcheManny, Theodore, MD  tizanidine (ZANAFLEX) 2 MG capsule Take 1 capsule (2 mg total) by mouth 3 (three) times daily as needed for muscle spasms. 02/18/15  Yes Gean Birchwoodowan, Frank, MD  zolpidem (AMBIEN) 10 MG tablet Take 10 mg by mouth at bedtime. For sleep   Yes [provider]    Family History Family History  Problem  Relation Age of Onset  . Stroke Mother   . Stroke Brother     Social History Social History   Tobacco Use  . Smoking status: Never Smoker  . Smokeless tobacco: Never Used  Substance Use Topics  . Alcohol use: No  . Drug use: No     Allergies   Codeine; Penicillins; and Sulfur   Review of Systems Review of Systems  All other systems reviewed and are negative.    Physical Exam Updated Vital Signs BP (!) 159/80   Pulse 71   Temp 98.8 F (37.1 C) (Oral)   Resp 19   Ht 1.727 m (5\' 8" )   SpO2 100%   BMI 27.37 kg/m   Physical Exam  Constitutional: She appears well-developed and well-nourished. No distress.  HENT:  Head: Normocephalic and atraumatic.  Right Ear: External ear normal.  Left Ear: External ear normal.  Eyes: Conjunctivae are normal. Right  eye exhibits no discharge. Left eye exhibits no discharge. No scleral icterus.  Neck: Neck supple. No tracheal deviation present.  Cardiovascular: Normal rate, regular rhythm and intact distal pulses.  Pulmonary/Chest: Effort normal and breath sounds normal. No stridor. No respiratory distress. She has no wheezes. She has no rales.  Abdominal: Soft. Bowel sounds are normal. She exhibits no distension. There is no tenderness. There is no rebound and no guarding.  Musculoskeletal: She exhibits no edema or tenderness.  Neurological: She is alert. She has normal strength. No cranial nerve deficit (no facial droop, extraocular movements intact, no slurred speech) or sensory deficit. She exhibits normal muscle tone. She displays no seizure activity. Coordination normal.  Skin: Skin is warm and dry. No rash noted.  Psychiatric: She has a normal mood and affect.  Nursing note and vitals reviewed.    ED Treatments / Results  Labs (all labs ordered are listed, but only abnormal results are displayed) Labs Reviewed  BASIC METABOLIC PANEL - Abnormal; Notable for the following components:      Result Value   Potassium 3.4 (*)     Chloride 100 (*)    Glucose, Bld 106 (*)    Calcium 10.4 (*)    GFR calc non Af Amer 49 (*)    GFR calc Af Amer 57 (*)    All other components within normal limits  BRAIN NATRIURETIC PEPTIDE - Abnormal; Notable for the following components:   B Natriuretic Peptide 365.3 (*)    All other components within normal limits  CBC  I-STAT TROPONIN, ED    EKG EKG Interpretation  Date/Time:  Saturday December 24 2017 17:40:53 EDT Ventricular Rate:  73 PR Interval:    QRS Duration: 140 QT Interval:  440 QTC Calculation: 485 R Axis:   -61 Text Interpretation:  Atrial fibrillation Left bundle branch block No significant change since last tracing Confirmed by Linwood Dibbles [id number]) on 12/24/2017 5:49:46 PM   Radiology Dg Chest 2 View  Result Date: 12/24/2017 CLINICAL DATA:  Shortness of breath EXAM: CHEST - 2 VIEW COMPARISON:  10/06/2015 FINDINGS: Intramedullary rod and screw fixation of the right humerus with removal of screw from the humeral head region. Tiny pleural effusions. Cardiomegaly with vascular congestion and mild pulmonary edema. Aortic atherosclerosis. No pneumothorax. IMPRESSION: Cardiomegaly with small pleural effusions, mild vascular congestion and mild interstitial edema. Electronically Signed   By: Jasmine Pang M.D.   On: 12/24/2017 18:46    Procedures Procedures (including critical care time)  Medications Ordered in ED Medications  nitroGLYCERIN (NITROGLYN) 2 % ointment 1 inch (1 inch Topical Given 12/24/17 1932)  furosemide (LASIX) injection 40 mg (40 mg Intravenous Given 12/24/17 1932)     Initial Impression / Assessment and Plan / ED Course  I have reviewed the triage vital signs and the nursing notes.  Pertinent labs & imaging results that were available during my care of the patient were reviewed by me and considered in my medical decision making (see chart for details).  Clinical Course as of Dec 24 1945  Sat Dec 24, 2017  1856 Chest x-ray is consistent with  congestive heart failure.  Nitrates and Lasix ordered   [JK]    Clinical Course User Index [JK] Linwood Dibbles, MD    Patient presented to the emergency room for evaluation of shortness of breath this past week.  Patient hasa history of CHF.  Her chest x-ray is consistent with an acute CHF exacerbation.  Laboratory tests are notable for an increase  in her BNP.  Patient was started on Lasix and diuretics.  Her blood pressure has improved.  Patient normally does not use any oxygen at home.  Considering her need for supplemental oxygen I will consult the medical service for admission.  Final Clinical Impressions(s) / ED Diagnoses   Final diagnoses:  Acute on chronic congestive heart failure, unspecified heart failure type [facility])      Linwood Dibbles, MD 12/24/17 1949

## 2017-12-24 NOTE — ED Triage Notes (Signed)
Per EMS, pt from home. Pt reports she has become increasingly sob over the last week. Pt reports she was attempting to get out of bed and felt like she couldn't breathe. Initially 90% on room air by fire. Pt put on 3L [place] and up to 98%. Pt reports oxygen therapy made her feel better. Pt denies any pain or sob at this time. Pt hx Afib and CHF. Pt A&Ox4 and hard of hearing. EMS BP 166/90.

## 2017-12-24 NOTE — Progress Notes (Signed)
Attempted to call the ED nurse for report at 2234, she stated, "I'm not ready for it now because I'm getting another patient". We couldn't get report earlier because there was no bed in the room and was settling a new admit in another room in less than 30 minutes apart.

## 2017-12-24 NOTE — ED Notes (Signed)
Attempted report x2. RN still unavailable. Asked to speak to charge RN, Diplomatic Services operational officersecretary Vickie stated charge was coming to take report. On hold for 7 minutes w/ no answer. ED charge made aware.

## 2017-12-24 NOTE — Progress Notes (Signed)
Attempted to receive report X2. The ED nurse verbalized that she is not ready at this time.

## 2017-12-24 NOTE — ED Notes (Signed)
Carrie PerchesSusie Best, Daughter [phone]

## 2017-12-24 NOTE — ED Notes (Signed)
Attempted report x1. RN unavailable @ this time. Callback # left. 

## 2017-12-24 NOTE — ED Notes (Signed)
ED Provider at bedside. 

## 2017-12-24 NOTE — H&P (Addendum)
History and Physical  Carrie Best:[id number] DOB: 1928-01-09 DOA: 12/24/2017 1729    PCP: Kirby Funk, MD  Outpatient Specialists:  Nahser (cardiology)  Chief Complaint: dyspnea  HPI: Carrie Best is a 84 y.o. female with hx of longstanding/persistent afib, diastolic CHF, arthritis, HTN, HLD dementia who presents with increasing dyspnea on exertion and at rest from home. Accompanied by daughter (caretaker) and granddaughter.   For past 5-7 days, noted increasing shortness of breath and PND. Mild ankle swelling. Stable 2 pillow orthopnea. At baseline wheelchair bound. On day of admission, also developed mild chest tightness associated with respiration. Per EMS, patient sats 90% on RA and BP 166/90. Daughter (caretaker) reports that she does eat frozen prepared meals with high salt content, ie from [employer] within the past week. No recent medication changes. On lasix 40mg  daily. She has not checked BP at home. No sick contacts, no fevers. Chronic intermittent dry cough. Not on home oxygen.   ED course:  VS BP 145/84 T 98.8 HR 64-116; SO2 98% on  3 lpm - received lasix 40mg  IV x 1  Review of Systems:  + dyspnea at rest and on mild exertion + slight increase in ankles b/l - stable orthopnea - no fevers/chills + dry cough, intermittent - no nausea/vomiting; no tarry, melanotic or bloody stools - no dysuria, increased urinary frequency - no weight changes  Rest of systems reviewed are negative, except as per above history.   Past Medical History:  Diagnosis Date  . Anxiety   . Arthritis   . Congestive heart failure (HCC)    Congestive heart failure symptoms  . Depression   . Diastolic dysfunction   . Dyspnea    occasionally, with exertion  . Dysrhythmia 05-10-12   hx. A. Fib., rate is controlled-tx. Xarelto  . GERD (gastroesophageal reflux disease) 05-10-12   tx. with meds as needed  . Hearing loss 05-10-12   bilateral hearing aids.  . Heart murmur   . History of  kidney stones   . Hyperlipidemia   . Hypertension   . Pneumonia 05-10-12   dx. in July- tx. antibiotics  . Stroke (HCC) 05-10-12   Lt.CVA note on scans-hx. freq. falls/ none past 6 months 01/21/14  . Wears hearing aid    bilateral   Past Surgical History:  Procedure Laterality Date  . APPENDECTOMY    . CATARACT EXTRACTION, BILATERAL  05-10-12   bilateral  . CYSTOSCOPY WITH RETROGRADE PYELOGRAM, URETEROSCOPY AND STENT PLACEMENT Right 01/31/2014   Procedure: CYSTOSCOPY WITH bilateral  RETROGRADE PYELOGRAM, right URETEROSCOPY AND right  STENT PLACEMENT, bladder biopsy with fulgeration;  Surgeon: Sebastian Ache, MD;  Location: [facility];  Service: Urology;  Laterality: Right;  . HARDWARE REMOVAL Right 09/09/2014   Procedure: HARDWARE REMOVAL RIGHT SHOULDER;  Surgeon: Mable Paris, MD;  Location: [facility];  Service: Orthopedics;  Laterality: Right;  Right shoulder hardware removal  . HOLMIUM LASER APPLICATION Right 01/31/2014   Procedure: HOLMIUM LASER APPLICATION;  Surgeon: Sebastian Ache, MD;  Location: [facility];  Service: Urology;  Laterality: Right;  . HUMERUS IM NAIL Right 06/10/2014   Procedure: INTRAMEDULLARY (IM) NAIL HUMERAL;  Surgeon: Mable Paris, MD;  Location: [facility];  Service: Orthopedics;  Laterality: Right;  Right intramedullary humeral nail  . INTERSTIM IMPLANT PLACEMENT  05-10-12   now malfunctioning  . INTERSTIM IMPLANT REMOVAL  05/16/2012   Procedure: REMOVAL OF INTERSTIM IMPLANT;  Surgeon: Martina Sinner, MD;  Location: [facility];  Service:  Urology;  Laterality: N/A;  . JOINT REPLACEMENT    . OTHER SURGICAL HISTORY     Bladder Surgery  . SHOULDER SURGERY    . TOTAL KNEE ARTHROPLASTY Right 02/17/2015   Procedure: TOTAL KNEE ARTHROPLASTY;  Surgeon: Gean BirchwoodFrank Rowan, MD;  Location: [facility];  Service: Orthopedics;  Laterality: Right;   Social History:  reports that she has never smoked. She has never used smokeless tobacco. She reports that she does not drink  alcohol or use drugs.  Allergies  Allergen Reactions  . Codeine Nausea And Vomiting  . Penicillins Hives    Has patient had a PCN reaction causing immediate rash, facial/tongue/throat swelling, SOB or lightheadedness with hypotension: No Has patient had a PCN reaction causing severe rash involving mucus membranes or skin necrosis: No Has patient had a PCN reaction that required hospitalization No Has patient had a PCN reaction occurring within the last 10 years: No If all of the above answers are "NO", then may proceed with Cephalosporin use.   . Sulfur Nausea And Vomiting    Family History  Problem Relation Age of Onset  . Stroke Mother   . Stroke Brother      Prior to Admission medications   Medication Sig Start Date End Date Taking? Authorizing Provider  atorvastatin (LIPITOR) 20 MG tablet Take 20 mg by mouth daily. 01/06/15  Yes [provider]  CARTIA XT 180 MG 24 hr capsule  11/01/16  Yes [provider]  DOCOSAHEXAENOIC ACID PO Take 1 g by mouth.   Yes [provider]  DULoxetine (CYMBALTA) 60 MG capsule Take 60 mg by mouth every morning.    Yes [provider]  furosemide (LASIX) 40 MG tablet TAKE 1 TABLET(40 MG) BY MOUTH DAILY 06/14/17  Yes Nahser, Deloris PingPhilip J, MD  LORazepam (ATIVAN) 0.5 MG tablet Take 0.5 mg by mouth every 8 (eight) hours as needed for anxiety.   Yes [provider]  meloxicam (MOBIC) 15 MG tablet Take 15 mg by mouth daily. 01/06/15  Yes [provider]  metoprolol tartrate (LOPRESSOR) 25 MG tablet Take 50 mg by mouth 2 (two) times daily.  03/23/12  Yes Kirby FunkGriffin, John, MD  potassium chloride SA (K-DUR,KLOR-CON) 20 MEQ tablet Take 1 tablet (20 mEq total) by mouth daily. 01/28/15 12/24/17 Yes Zannie CoveJoseph, Preetha, MD  senna-docusate (SENOKOT-S) 8.6-50 MG per tablet Take 1 tablet by mouth 2 (two) times daily. While taking pain meds to prevent constipation Patient taking differently: Take 1 tablet by mouth 2 (two) times daily  as needed for mild constipation. While taking pain meds to prevent constipation 01/31/14  Yes Sebastian AcheManny, Theodore, MD  tizanidine (ZANAFLEX) 2 MG capsule Take 1 capsule (2 mg total) by mouth 3 (three) times daily as needed for muscle spasms. 02/18/15  Yes Gean Birchwoodowan, Frank, MD  zolpidem (AMBIEN) 10 MG tablet Take 10 mg by mouth at bedtime. For sleep   Yes [provider]    Temp:  [98.8 F (37.1 C)] 98.8 F (37.1 C) (04/20 1740) Pulse Rate:  [61-116] 71 (04/20 1930) Cardiac Rhythm: Atrial fibrillation (04/20 1737) Resp:  [15-20] 19 (04/20 1930) BP: (145-200)/(74-84) 159/80 (04/20 1930) SpO2:  [96 %-100 %] 100 % (04/20 1930)  PHYSICAL EXAM:  BP (!) 159/80   Pulse 71   Temp 98.8 F (37.1 C) (Oral)   Resp 19   Ht 5\' 8"  (1.727 m)   SpO2 100%   BMI 27.37 kg/m   GEN obese elderly caucasian female; resting comfortably in bed with [facility]  in place  HEENT NCAT EOM PERRL; clear oropharynx, no cervical LAD; moist mucus membranes  JVP estimated 9 cm H2O above RA; no HJR ; no carotid bruits b/l ;  CV irregular normal rate; normal S1 and S2; no m/r/g or S3/S4; PMI non palpable; no parasternal heave  RESP  Diminished at bases, otherwise clear; breathing unlabored and symmetric  ABD soft NT ND +normoactive BS  EXT warm throughout b/l; trace ankle edema b/l  PULSES  DP and radials 2+ intact symmetric b/l  SKIN/MSK no rashes or lesions  NEURO/PSYCH no focal deficits; hearing impaired but can hear with loud volume   LABS ON ADMISSION:  Basic Metabolic Panel: Recent Labs  Lab 12/24/17 1819  NA 138  K 3.4*  CL 100*  CO2 27  GLUCOSE 106*  BUN 12  CREATININE 1.00  CALCIUM 10.4*   CBC: Recent Labs  Lab 12/24/17 1819  WBC 8.2  HGB 13.4  HCT 39.3  MCV 89.3  PLT 192   Liver Function Tests: No results for input(s): AST, ALT, ALKPHOS, BILITOT, PROT, ALBUMIN in the last 168 hours. No results for input(s): LIPASE, AMYLASE in the last 168 hours. No results for input(s): AMMONIA in the last  168 hours. Coagulation:  Lab Results  Component Value Date   INR 2.85 (H) 02/17/2015   INR 3.27 (H) 02/17/2015   INR 3.00 (H) 02/07/2015   No results found for: PTT Lactic Acid, Venous:     Component Value Date/Time   LATICACIDVEN 1.02 10/06/2015 1855   Cardiac Enzymes: No results for input(s): CKTOTAL, CKMB, CKMBINDEX, TROPONINI in the last 168 hours.  BNP (last 3 results) No results for input(s): PROBNP in the last 8760 hours. CBG: No results for input(s): GLUCAP in the last 168 hours.  Radiological Exams on Admission: Dg Chest 2 View  Result Date: 12/24/2017 CLINICAL DATA:  Shortness of breath EXAM: CHEST - 2 VIEW COMPARISON:  10/06/2015 FINDINGS: Intramedullary rod and screw fixation of the right humerus with removal of screw from the humeral head region. Tiny pleural effusions. Cardiomegaly with vascular congestion and mild pulmonary edema. Aortic atherosclerosis. No pneumothorax. IMPRESSION: Cardiomegaly with small pleural effusions, mild vascular congestion and mild interstitial edema. Electronically Signed   By: Jasmine Pang M.D.   On: 12/24/2017 18:46    EKG: Independently reviewed. Atrial fibrillation with LBBB, normal vent rate (no significant chagnes compared to 02/24/2017)  Assessment/Plan MARQUESA RATH is a 84 y.o. female who presents with dyspnea and mild hypoxia likely due to acute on chronic diastolic heart failure exacerbation in setting of diastolic dysfunction and chronic/persistent afib (on xarelto). Breathing and sats already improved while in ED after lasix injection. Still mildly volume up. Suspect under-treated HTN and high sodium diet given recent frozen meals from [employer] that she has been consuming at home. Last echo was in 03/2017 - preserved EF. Chronic afib currently rate well controlled. Will continue spot dose IV diuresis and add BP control -- previously on lisinopril which was discontinued a few years ago. Last admission for CHF exacerbation was  in 08/2015, so she had been doing relatively well.   Active Problems:   Acute diastolic CHF (congestive heart failure) (HCC)   # CHF exacerbation: known diastolic dysfunction > received lasix 40mg  IV x 1 in ED at 7pm - lasix 20mg  IV x 1 ordered for tomorrow AM - high goal lyte repletion esp potassium  - wean off O2 tomorrow as tolerated, sat goal > 95% - consider increasing PO  lasix dose on discharge to 40mg  BID - counseled on low sodium 2gm/day diet (family needs education prior to discharge) - fluid restricted/low salt diet - telemetry and daily weights  # HTN, poorly controlled - lisinopril 5mg  starting tonight daily (previously on lisinopril, no reported allergy) - if BP uncontrolled tomorrow, increase lisinopril  - may benefit from spironolactone as outpatient given diastolic dysfunction  # Persistent afib - on diltiazem and xarelto. CHADS2VASc ~ 4 - resume dilt 180mg  DR in morning - resume xarelto 20mg  in morning with breakfast - resume home metoprolol tartrate 50mg  BID  # Hypokalemia - on home K - K repletion now and resume home K 20 meq daily  # Hx of lower extremity leg spasms (chronic) - resumed home tizanidine 2mg  prn TID  # Hx of HLD - resume home atorvastatin  # Hx of anxiety - resume cymbalta  # Hx of of intermittent constipation - prn senokot   DVT Prophylaxis: on xarelto already  Code Status:  Full Code Family Communication: spoke with daughter Susie at bedside  Disposition Plan: overnight obs admission  Extended Emergency Contact Information Primary Emergency Contact: Swinson,Susie Address: [address]          [place], [place] [address] [place] Home Phone: [phone] Mobile Phone: [phone] Relation: Daughter Secondary Emergency Contact: Berkley [place] Home Phone: [phone] Relation: Daughter  Time spent: > 50 minutes  Ike Bene, MD [facility] Pager [phone]  If 7PM-7AM, please  contact night-coverage [email or web address] Password [facility] 12/24/2017, 8:44 PM

## 2017-12-25 ENCOUNTER — Other Ambulatory Visit: Payer: Self-pay

## 2017-12-25 ENCOUNTER — Encounter (HOSPITAL_COMMUNITY): Payer: Self-pay | Admitting: *Deleted

## 2017-12-25 DIAGNOSIS — I5033 Acute on chronic diastolic (congestive) heart failure: Secondary | ICD-10-CM | POA: Diagnosis not present

## 2017-12-25 DIAGNOSIS — E876 Hypokalemia: Secondary | ICD-10-CM

## 2017-12-25 LAB — BASIC METABOLIC PANEL
Anion gap: 7 (ref 5–15)
BUN: 12 mg/dL (ref 6–20)
CALCIUM: 9.7 mg/dL (ref 8.9–10.3)
CO2: 28 mmol/L (ref 22–32)
CREATININE: 1.04 mg/dL — AB (ref 0.44–1.00)
Chloride: 101 mmol/L (ref 101–111)
GFR calc Af Amer: 54 mL/min — ABNORMAL LOW (ref >60)
GFR calc non Af Amer: 47 mL/min — ABNORMAL LOW (ref >60)
Glucose, Bld: 123 mg/dL — ABNORMAL HIGH (ref 65–99)
Potassium: 3.4 mmol/L — ABNORMAL LOW (ref 3.5–5.1)
Sodium: 136 mmol/L (ref 135–145)

## 2017-12-25 LAB — MAGNESIUM: Magnesium: 1.9 mg/dL (ref 1.7–2.4)

## 2017-12-25 LAB — CBC
HCT: 36.7 % (ref 36.0–46.0)
Hemoglobin: 12.4 g/dL (ref 12.0–15.0)
MCH: 30.6 pg (ref 26.0–34.0)
MCHC: 33.8 g/dL (ref 30.0–36.0)
MCV: 90.6 fL (ref 78.0–100.0)
PLATELETS: 186 10*3/uL (ref 150–400)
RBC: 4.05 MIL/uL (ref 3.87–5.11)
RDW: 14.7 % (ref 11.5–15.5)
WBC: 8.3 10*3/uL (ref 4.0–10.5)

## 2017-12-25 MED ORDER — POTASSIUM CHLORIDE 20 MEQ PO PACK
40.0000 meq | PACK | Freq: Once | ORAL | Status: AC
  Administered 2017-12-25: 40 meq via ORAL
  Filled 2017-12-25: qty 2

## 2017-12-25 MED ORDER — POTASSIUM CHLORIDE CRYS ER 20 MEQ PO TBCR
40.0000 meq | EXTENDED_RELEASE_TABLET | Freq: Once | ORAL | Status: AC
  Administered 2017-12-25: 40 meq via ORAL
  Filled 2017-12-25: qty 2

## 2017-12-25 MED ORDER — RIVAROXABAN 15 MG PO TABS: 15.0000 mg | ORAL_TABLET | Freq: Every day | ORAL | Status: DC

## 2017-12-25 MED ORDER — FUROSEMIDE 40 MG PO TABS: 40.0000 mg | ORAL_TABLET | Freq: Two times a day (BID) | ORAL | 0 refills | Status: DC

## 2017-12-25 MED ORDER — FUROSEMIDE 40 MG PO TABS
40.0000 mg | ORAL_TABLET | Freq: Every day | ORAL | Status: DC
  Administered 2017-12-25: 40 mg via ORAL
  Filled 2017-12-25: qty 1

## 2017-12-25 NOTE — Discharge Summary (Signed)
Physician Discharge Summary  Carrie Best:[id number] DOB: 1927-11-27 DOA: 12/24/2017  PCP: Kirby Funk, MD  Admit date: 12/24/2017 Discharge date: 12/25/2017  Admitted From: Home Disposition:  Home   Recommendations for Outpatient Follow-up:  1. Follow up with PCP in 1 week 2. Please obtain BMP in 1 week to recheck potassium level. Hypokalemia replaced prior to discharge.   Discharge Condition: Stable CODE STATUS: Full  Diet recommendation: Heart healthy, discussed low sodium diet and refraining from canned foods, frozen meals at home   Brief/Interim Summary: Carrie Best is a 84 y.o. female with hx of longstanding/persistent afib, diastolic CHF, arthritis, HTN, HLD, dementia who presents with increasing dyspnea on exertion and at rest from home. Accompanied by daughter (caretaker) and granddaughter. For past 5-7 days, she noted increasing shortness of breath and PND with mild ankle swelling. At baseline, she is wheelchair bound. On day of admission, she also developed mild chest tightness associated with respiration. Per EMS, patient sats 90% on RA and BP 166/90. Daughter (caretaker) reports that she does eat frozen prepared meals with high salt content, ie from [employer] within the past week. No recent medication changes. On lasix 40mg  daily.  Patient was admitted due to acute on chronic diastolic heart failure with acute hypoxemic respiratory failure.  She was placed on nasal cannula O2 as well as IV Lasix.  On morning of discharge, she was able to be weaned to room air.  She had no further respiratory complaints, her bilateral lower extremity edema had resolved, her lung sounds were clear to asculatation.  Her oral Lasix will be increased to 40 mg twice daily.  Family was counseled on low-sodium diet.  Discharge Diagnoses:  Principal Problem:   Acute on chronic diastolic CHF (congestive heart failure) (HCC) Active Problems:   HTN (hypertension)   Hyperlipidemia   Atrial  fibrillation (HCC)   Hypokalemia  Discharge Instructions  Discharge Instructions    (HEART FAILURE PATIENTS) Call MD:  Anytime you have any of the following symptoms: 1) 3 pound weight gain in 24 hours or 5 pounds in 1 week 2) shortness of breath, with or without a dry hacking cough 3) swelling in the hands, feet or stomach 4) if you have to sleep on extra pillows at night in order to breathe.   Complete by:  As directed    Call MD for:  difficulty breathing, headache or visual disturbances   Complete by:  As directed    Call MD for:  extreme fatigue   Complete by:  As directed    Call MD for:  hives   Complete by:  As directed    Call MD for:  persistant dizziness or light-headedness   Complete by:  As directed    Call MD for:  persistant nausea and vomiting   Complete by:  As directed    Call MD for:  severe uncontrolled pain   Complete by:  As directed    Call MD for:  temperature >100.4   Complete by:  As directed    Diet - low sodium heart healthy   Complete by:  As directed    Discharge instructions   Complete by:  As directed    You were cared for by a hospitalist during your hospital stay. If you have any questions about your discharge medications or the care you received while you were in the hospital after you are discharged, you can call the unit and asked to speak with the hospitalist on  call if the hospitalist that took care of you is not available. Once you are discharged, your primary care physician will handle any further medical issues. Please note that NO REFILLS for any discharge medications will be authorized once you are discharged, as it is imperative that you return to your primary care physician (or establish a relationship with a primary care physician if you do not have one) for your aftercare needs so that they can reassess your need for medications and monitor your lab values.   Increase activity slowly   Complete by:  As directed      Allergies as of  12/25/2017      Reactions   Codeine Nausea And Vomiting   Penicillins Hives   Has patient had a PCN reaction causing immediate rash, facial/tongue/throat swelling, SOB or lightheadedness with hypotension: No Has patient had a PCN reaction causing severe rash involving mucus membranes or skin necrosis: No Has patient had a PCN reaction that required hospitalization No Has patient had a PCN reaction occurring within the last 10 years: No If all of the above answers are "NO", then may proceed with Cephalosporin use.   Sulfur Nausea And Vomiting      Medication List    TAKE these medications   atorvastatin 20 MG tablet Commonly known as:  LIPITOR Take 20 mg by mouth daily.   CARTIA XT 180 MG 24 hr capsule Generic drug:  diltiazem   DOCOSAHEXAENOIC ACID PO Take 1 g by mouth.   DULoxetine 60 MG capsule Commonly known as:  CYMBALTA Take 60 mg by mouth every morning.   furosemide 40 MG tablet Commonly known as:  LASIX Take 1 tablet (40 mg total) by mouth 2 (two) times daily. What changed:  See the new instructions.   LORazepam 0.5 MG tablet Commonly known as:  ATIVAN Take 0.5 mg by mouth every 8 (eight) hours as needed for anxiety.   meloxicam 15 MG tablet Commonly known as:  MOBIC Take 15 mg by mouth daily.   metoprolol tartrate 25 MG tablet Commonly known as:  LOPRESSOR Take 50 mg by mouth 2 (two) times daily.   potassium chloride SA 20 MEQ tablet Commonly known as:  K-DUR,KLOR-CON Take 1 tablet (20 mEq total) by mouth daily.   rivaroxaban 20 MG Tabs tablet Commonly known as:  XARELTO Take 20 mg by mouth daily with breakfast.   senna-docusate 8.6-50 MG tablet Commonly known as:  Senokot-S Take 1 tablet by mouth 2 (two) times daily. While taking pain meds to prevent constipation What changed:    when to take this  reasons to take this  additional instructions   tizanidine 2 MG capsule Commonly known as:  ZANAFLEX Take 1 capsule (2 mg total) by mouth 3  (three) times daily as needed for muscle spasms.   zolpidem 10 MG tablet Commonly known as:  AMBIEN Take 10 mg by mouth at bedtime. For sleep      Follow-up Information    Kirby FunkGriffin, John, MD. Schedule an appointment as soon as possible for a visit in 1 week(s).   Specialty:  Internal Medicine Contact information: [address] [place] [address] [phone]          Allergies  Allergen Reactions  . Codeine Nausea And Vomiting  . Penicillins Hives    Has patient had a PCN reaction causing immediate rash, facial/tongue/throat swelling, SOB or lightheadedness with hypotension: No Has patient had a PCN reaction causing severe rash involving mucus membranes  or skin necrosis: No Has patient had a PCN reaction that required hospitalization No Has patient had a PCN reaction occurring within the last 10 years: No If all of the above answers are "NO", then may proceed with Cephalosporin use.   . Sulfur Nausea And Vomiting    Consultations:  None    Procedures/Studies: Dg Chest 2 View  Result Date: 12/24/2017 CLINICAL DATA:  Shortness of breath EXAM: CHEST - 2 VIEW COMPARISON:  10/06/2015 FINDINGS: Intramedullary rod and screw fixation of the right humerus with removal of screw from the humeral head region. Tiny pleural effusions. Cardiomegaly with vascular congestion and mild pulmonary edema. Aortic atherosclerosis. No pneumothorax. IMPRESSION: Cardiomegaly with small pleural effusions, mild vascular congestion and mild interstitial edema. Electronically Signed   By: Jasmine Pang M.D.   On: 12/24/2017 18:46      Discharge Exam: Vitals:   12/25/17 1042 12/25/17 1149  BP:  (!) 156/68  Pulse:  (!) 56  Resp:  18  Temp:  97.9 F (36.6 C)  SpO2: 94% 94%    General: Pt is alert, awake, not in acute distress Cardiovascular: Irreg rhythm rate 60s, S1/S2 +, no rubs, no gallops Respiratory: CTA bilaterally, no wheezing, no rhonchi Abdominal: Soft, NT, ND,  bowel sounds + Extremities: no edema, no cyanosis    The results of significant diagnostics from this hospitalization (including imaging, microbiology, ancillary and laboratory) are listed below for reference.     Microbiology: No results found for this or any previous visit (from the past 240 hour(s)).   Labs: BNP (last 3 results) Recent Labs    12/24/17 1819  BNP 365.3*   Basic Metabolic Panel: Recent Labs  Lab 12/24/17 1819 12/25/17 0837  NA 138 136  K 3.4* 3.4*  CL 100* 101  CO2 27 28  GLUCOSE 106* 123*  BUN 12 12  CREATININE 1.00 1.04*  CALCIUM 10.4* 9.7  MG  --  1.9   Liver Function Tests: No results for input(s): AST, ALT, ALKPHOS, BILITOT, PROT, ALBUMIN in the last 168 hours. No results for input(s): LIPASE, AMYLASE in the last 168 hours. No results for input(s): AMMONIA in the last 168 hours. CBC: Recent Labs  Lab 12/24/17 1819 12/25/17 0837  WBC 8.2 8.3  HGB 13.4 12.4  HCT 39.3 36.7  MCV 89.3 90.6  PLT 192 186   Cardiac Enzymes: No results for input(s): CKTOTAL, CKMB, CKMBINDEX, TROPONINI in the last 168 hours. BNP: Invalid input(s): POCBNP CBG: No results for input(s): GLUCAP in the last 168 hours. D-Dimer No results for input(s): DDIMER in the last 72 hours. Hgb A1c No results for input(s): HGBA1C in the last 72 hours. Lipid Profile No results for input(s): CHOL, HDL, LDLCALC, TRIG, CHOLHDL, LDLDIRECT in the last 72 hours. Thyroid function studies No results for input(s): TSH, T4TOTAL, T3FREE, THYROIDAB in the last 72 hours.  Invalid input(s): FREET3 Anemia work up No results for input(s): VITAMINB12, FOLATE, FERRITIN, TIBC, IRON, RETICCTPCT in the last 72 hours. Urinalysis    Component Value Date/Time   COLORURINE YELLOW 02/07/2015 1534   APPEARANCEUR CLEAR 02/07/2015 1534   LABSPEC 1.007 02/07/2015 1534   PHURINE 7.0 02/07/2015 1534   GLUCOSEU NEGATIVE 02/07/2015 1534   HGBUR NEGATIVE 02/07/2015 1534   BILIRUBINUR NEGATIVE  02/07/2015 1534   KETONESUR NEGATIVE 02/07/2015 1534   PROTEINUR NEGATIVE 02/07/2015 1534   UROBILINOGEN 0.2 02/07/2015 1534   NITRITE NEGATIVE 02/07/2015 1534   LEUKOCYTESUR TRACE (A) 02/07/2015 1534   Sepsis Labs Invalid input(s): PROCALCITONIN,  WBC,  LACTICIDVEN Microbiology No results found for this or any previous visit (from the past 240 hour(s)).   Patient was seen and examined on the day of discharge and was found to be in stable condition. Time coordinating discharge: 45 minutes including assessment and coordination of care, as well as examination of the patient.   SIGNED:  Noralee Stain, DO [facility] Pager [phone]  If 7PM-7AM, please contact night-coverage [email or web address] Password [facility] 12/25/2017, 2:31 PM

## 2017-12-25 NOTE — Progress Notes (Signed)
While nurse was dc pt and reviewing medications and daughter had a questions pt taking lasix and metoprolol, paged Dr Alvino Chapelhoi and she spoke to family on the phone to clairify with daughter, all questions were entertained and answered

## 2017-12-25 NOTE — Progress Notes (Signed)
Pt IV discontinued, catheter intact and telemetry removed  

## 2018-05-26 ENCOUNTER — Other Ambulatory Visit: Payer: Self-pay | Admitting: Cardiovascular Disease

## 2018-07-03 ENCOUNTER — Encounter: Payer: Self-pay | Admitting: Cardiovascular Disease

## 2018-07-03 ENCOUNTER — Ambulatory Visit: Payer: Medicare Other | Admitting: Cardiovascular Disease

## 2018-07-03 VITALS — BP 146/82 | HR 63 | Ht 68.0 in | Wt 182.0 lb

## 2018-07-03 DIAGNOSIS — I482 Chronic atrial fibrillation, unspecified: Secondary | ICD-10-CM

## 2018-07-03 DIAGNOSIS — I5032 Chronic diastolic (congestive) heart failure: Secondary | ICD-10-CM

## 2018-07-03 MED ORDER — ISOSORBIDE MONONITRATE ER 30 MG PO TB24: 30.0000 mg | ORAL_TABLET | Freq: Every day | ORAL | 11 refills | Status: DC

## 2018-07-03 MED ORDER — TORSEMIDE 20 MG PO TABS: 20.0000 mg | ORAL_TABLET | Freq: Two times a day (BID) | ORAL | 11 refills | Status: DC

## 2018-07-03 NOTE — Patient Instructions (Addendum)
Medication Instructions:  Your physician has recommended you make the following change in your medication:   STOP Furosemide (Lasix) START Torsemide 20 mg twice daily START Imdur (isosorbide) 30 mg daily  If you need a refill on your cardiac medications before your next appointment, please call your pharmacy.   Lab work: Your physician recommends that you return for lab work (basic metabolic panel) in: 2 months on the same day you return for office visit   If you have labs (blood work) drawn today and your tests are completely normal, you will receive your results only by: Marland Kitchen MyChart Message (if you have MyChart) OR . A paper copy in the mail If you have any lab test that is abnormal or we need to change your treatment, we will call you to review the results.   Testing/Procedures: Your physician has requested that you have an echocardiogram. Echocardiography is a painless test that uses sound waves to create images of your heart. It provides your doctor with information about the size and shape of your heart and how well your heart's chambers and valves are working. This procedure takes approximately one hour. There are no restrictions for this procedure.    Follow-Up: At [facility], you and your health needs are our priority.  As part of our continuing mission to provide you with exceptional heart care, we have created designated Provider Care Teams.  These Care Teams include your primary Cardiologist (physician) and Advanced Practice Providers (APPs -  Physician Assistants and Nurse Practitioners) who all work together to provide you with the care you need, when you need it. You will need a follow up appointment in:  2 months. You may see Kristeen Miss, MD or one of the following Advanced Practice Providers on your designated Care Team: Tereso Newcomer, PA-C [place], [place] . Berton Bon, NP   **Our office will call you to schedule appointments

## 2018-07-03 NOTE — Progress Notes (Signed)
Cardiology Office Note   Date:  07/03/2018   ID:  Carrie Best, DOB 11/14/1927, MRN [id number]  PCP:  Kirby Funk, MD  Cardiologist:   Kristeen Miss, MD   Chief Complaint  Patient presents with  . Atrial Fibrillation  . Congestive Heart Failure   1. Atrial fibrillation 2. Hypertension 3. Dementia 4. Diastolic dysfunction 5. Hyperlipidemia   Carrie Best is an 84 yo who I have seen in the past. She was admitted with respiratory failure last year. She was found to She has developed some dementia. She has had frequent episodes of dyspnea. Typically with exertion. She does not eat any extra salt.   She still feels poorly - has good days and bad days   She is not able to walk any distance primarily due to leg weakness and leg pain.   Jan 16, 2014:  Carrie Best is doing oK. Able to do her normal activies with minimal dyspnea ( she does not do much).     February 06, 2015:  Carrie Best is a 84 y.o. female who presents for her  Atrial fib and chronic diastolic congestive heart failure. She was recently hospitalized for mental status changes ( hypersomulence)  No specific etiology was found   She needs to have knee surgery.   Will need to hold the Xarelto  February 06, 2016: Had her knee surgery last year but it was not successful.   Now she is wheelchair buond. Wants to have shoulder surgery now   Oct. 23, 2018:    Had shoulder surgery .   Had some complications apparently . Had right knee surgery that did not go well.    June 25, 2018:  Has not felt well since last week .  Has had some dyspnea,   Is in a wheelchair all the time  + orthopnea  Does not eat much salty foods  Has had some leg swelling  Takes furosemide 40 BID   Echo last year showed normal LV systolic function    Past Medical History:  Diagnosis Date  . Anxiety   . Arthritis   . Congestive heart failure (HCC)    Congestive heart failure symptoms  . Depression   . Diastolic  dysfunction   . Dyspnea    occasionally, with exertion  . Dysrhythmia 05-10-12   hx. A. Fib., rate is controlled-tx. Xarelto  . GERD (gastroesophageal reflux disease) 05-10-12   tx. with meds as needed  . Hearing loss 05-10-12   bilateral hearing aids.  . Heart murmur   . History of kidney stones   . Hyperlipidemia   . Hypertension   . Pneumonia 05-10-12   dx. in July- tx. antibiotics  . Stroke (HCC) 05-10-12   Lt.CVA note on scans-hx. freq. falls/ none past 6 months 01/21/14  . Wears hearing aid    bilateral    Past Surgical History:  Procedure Laterality Date  . APPENDECTOMY    . CATARACT EXTRACTION, BILATERAL  05-10-12   bilateral  . CYSTOSCOPY WITH RETROGRADE PYELOGRAM, URETEROSCOPY AND STENT PLACEMENT Right 01/31/2014   Procedure: CYSTOSCOPY WITH bilateral  RETROGRADE PYELOGRAM, right URETEROSCOPY AND right  STENT PLACEMENT, bladder biopsy with fulgeration;  Surgeon: Sebastian Ache, MD;  Location: [facility];  Service: Urology;  Laterality: Right;  . HARDWARE REMOVAL Right 09/09/2014   Procedure: HARDWARE REMOVAL RIGHT SHOULDER;  Surgeon: Mable Paris, MD;  Location: [facility];  Service: Orthopedics;  Laterality: Right;  Right shoulder hardware removal  .  HOLMIUM LASER APPLICATION Right 01/31/2014   Procedure: HOLMIUM LASER APPLICATION;  Surgeon: Sebastian Ache, MD;  Location: [facility];  Service: Urology;  Laterality: Right;  . HUMERUS IM NAIL Right 06/10/2014   Procedure: INTRAMEDULLARY (IM) NAIL HUMERAL;  Surgeon: Mable Paris, MD;  Location: [facility];  Service: Orthopedics;  Laterality: Right;  Right intramedullary humeral nail  . INTERSTIM IMPLANT PLACEMENT  05-10-12   now malfunctioning  . INTERSTIM IMPLANT REMOVAL  05/16/2012   Procedure: REMOVAL OF INTERSTIM IMPLANT;  Surgeon: Martina Sinner, MD;  Location: [facility];  Service: Urology;  Laterality: N/A;  . JOINT REPLACEMENT    . OTHER SURGICAL HISTORY     Bladder Surgery  . SHOULDER SURGERY    . TOTAL  KNEE ARTHROPLASTY Right 02/17/2015   Procedure: TOTAL KNEE ARTHROPLASTY;  Surgeon: Gean Birchwood, MD;  Location: [facility];  Service: Orthopedics;  Laterality: Right;     Current Outpatient Medications  Medication Sig Dispense Refill  . CARTIA XT 180 MG 24 hr capsule Take 180 mg by mouth daily.     . DULoxetine (CYMBALTA) 60 MG capsule Take 60 mg by mouth every morning.     . furosemide (LASIX) 40 MG tablet Take 40 mg by mouth.    Marland Kitchen LORazepam (ATIVAN) 0.5 MG tablet Take 0.5 mg by mouth every 8 (eight) hours as needed for anxiety.    . meloxicam (MOBIC) 15 MG tablet Take 15 mg by mouth daily.    . metoprolol tartrate (LOPRESSOR) 25 MG tablet Take 50 mg by mouth 2 (two) times daily.     . potassium chloride SA (K-DUR,KLOR-CON) 20 MEQ tablet Take 1 tablet (20 mEq total) by mouth daily. 30 tablet 11  . zolpidem (AMBIEN) 10 MG tablet Take 10 mg by mouth at bedtime. For sleep     No current facility-administered medications for this visit.     Allergies:   Codeine; Penicillins; and Sulfur    Social History:  The patient  reports that she has never smoked. She has never used smokeless tobacco. She reports that she does not drink alcohol or use drugs.   Family History:  The patient's family history includes Stroke in her brother and mother.    ROS:  Please see the history of present illness.      All other systems are reviewed and negative.    Physical Exam: Blood pressure (!) 146/82, pulse 63, height 5\' 8"  (1.727 m), weight 182 lb (82.6 kg), SpO2 95 %.  GEN:  Well nourished, well developed in no acute distress HEENT: Normal NECK: No JVD; No carotid bruits LYMPHATICS: No lymphadenopathy CARDIAC: Irreg. Irreg.  , no murmurs, rubs, gallops RESPIRATORY:  Clear to auscultation without rales, wheezing or rhonchi  ABDOMEN: Soft, non-tender, non-distended MUSCULOSKELETAL:  No edema; No deformity  SKIN: Warm and dry NEUROLOGIC:  Alert and oriented x 3    EKG:       Recent  Labs: 12/24/2017: B Natriuretic Peptide 365.3 12/25/2017: BUN 12; Creatinine, Ser 1.04; Hemoglobin 12.4; Magnesium 1.9; Platelets 186; Potassium 3.4; Sodium 136    Lipid Panel No results found for: CHOL, TRIG, HDL, CHOLHDL, VLDL, LDLCALC, LDLDIRECT    Wt Readings from Last 3 Encounters:  07/03/18 182 lb (82.6 kg)  12/25/17 183 lb 14.4 oz (83.4 kg)  06/10/17 180 lb (81.6 kg)      Other studies Reviewed: Additional studies/ records that were reviewed today include: . Review of the above records demonstrates:   ASSESSMENT AND PLAN:  1.  Atrial fibrillation - chronic , stable   2. Hypertension -  BP is well controlled.   3. Dementia -her daughter Lynnell Dike accompanied her to her appointment today..   4. Chronic Diastolic dysfunction: sHe is to be having more trouble with shortness of breath.  Her lungs are fairly clear.  She does have trace edema in her legs.  She complains of PND and orthopnea.   We will stop the furosemide and try torsemide 20 mg twice a day. We will try having her take some additional doses of torsemide see if this helps her.  I will also give her isosorbide 30 mg a day.  We discussed the possibility that this shortness of breath might represent an unstable angina equivalent.  Given her advanced age, the fact that she is wheelchair-bound, and at least moderately demented, I do not think that she is a good candidate for invasive procedures.  Hesitate to do stress testing and would hesitate to recommend invasive/interventional procedures.  5. Hyperlipidemia : managed by Dr. Valentina Lucks    Current medicines are reviewed at length with the patient today.  The patient does not have concerns regarding medicines.  The following changes have been made:  no change  Labs/ tests ordered today include:  No orders of the defined types were placed in this encounter.    Disposition:   FU with Vin in 3 months     Kristeen Miss, MD  07/03/2018 5:24 PM    [facility] [address] [place], [place], [place]  [address] Phone: [phone]; Fax: [phone]

## 2018-07-09 ENCOUNTER — Observation Stay (HOSPITAL_COMMUNITY)
Admission: EM | Admit: 2018-07-09 | Discharge: 2018-07-11 | Disposition: A | Discharge status: Home / Self Care | Payer: Medicare Other | Attending: Internal Medicine | Admitting: Internal Medicine

## 2018-07-09 ENCOUNTER — Emergency Department (HOSPITAL_COMMUNITY): Payer: Medicare Other

## 2018-07-09 ENCOUNTER — Encounter (HOSPITAL_COMMUNITY): Payer: Self-pay | Admitting: Emergency Medicine

## 2018-07-09 DIAGNOSIS — N281 Cyst of kidney, acquired: Secondary | ICD-10-CM | POA: Insufficient documentation

## 2018-07-09 DIAGNOSIS — E785 Hyperlipidemia, unspecified: Secondary | ICD-10-CM | POA: Diagnosis not present

## 2018-07-09 DIAGNOSIS — I110 Hypertensive heart disease with heart failure: Secondary | ICD-10-CM | POA: Insufficient documentation

## 2018-07-09 DIAGNOSIS — Z9841 Cataract extraction status, right eye: Secondary | ICD-10-CM | POA: Insufficient documentation

## 2018-07-09 DIAGNOSIS — E860 Dehydration: Secondary | ICD-10-CM | POA: Diagnosis not present

## 2018-07-09 DIAGNOSIS — Z96651 Presence of right artificial knee joint: Secondary | ICD-10-CM | POA: Insufficient documentation

## 2018-07-09 DIAGNOSIS — Z7401 Bed confinement status: Secondary | ICD-10-CM | POA: Insufficient documentation

## 2018-07-09 DIAGNOSIS — K429 Umbilical hernia without obstruction or gangrene: Secondary | ICD-10-CM | POA: Insufficient documentation

## 2018-07-09 DIAGNOSIS — R011 Cardiac murmur, unspecified: Secondary | ICD-10-CM | POA: Diagnosis not present

## 2018-07-09 DIAGNOSIS — R112 Nausea with vomiting, unspecified: Principal | ICD-10-CM | POA: Diagnosis present

## 2018-07-09 DIAGNOSIS — M1990 Unspecified osteoarthritis, unspecified site: Secondary | ICD-10-CM | POA: Diagnosis not present

## 2018-07-09 DIAGNOSIS — N179 Acute kidney failure, unspecified: Secondary | ICD-10-CM | POA: Diagnosis present

## 2018-07-09 DIAGNOSIS — Z882 Allergy status to sulfonamides status: Secondary | ICD-10-CM | POA: Insufficient documentation

## 2018-07-09 DIAGNOSIS — Z961 Presence of intraocular lens: Secondary | ICD-10-CM | POA: Diagnosis not present

## 2018-07-09 DIAGNOSIS — Z79899 Other long term (current) drug therapy: Secondary | ICD-10-CM | POA: Insufficient documentation

## 2018-07-09 DIAGNOSIS — I5032 Chronic diastolic (congestive) heart failure: Secondary | ICD-10-CM | POA: Diagnosis present

## 2018-07-09 DIAGNOSIS — K8020 Calculus of gallbladder without cholecystitis without obstruction: Secondary | ICD-10-CM | POA: Diagnosis not present

## 2018-07-09 DIAGNOSIS — I081 Rheumatic disorders of both mitral and tricuspid valves: Secondary | ICD-10-CM | POA: Insufficient documentation

## 2018-07-09 DIAGNOSIS — K5900 Constipation, unspecified: Secondary | ICD-10-CM | POA: Insufficient documentation

## 2018-07-09 DIAGNOSIS — Z885 Allergy status to narcotic agent status: Secondary | ICD-10-CM | POA: Insufficient documentation

## 2018-07-09 DIAGNOSIS — H9193 Unspecified hearing loss, bilateral: Secondary | ICD-10-CM | POA: Insufficient documentation

## 2018-07-09 DIAGNOSIS — Z993 Dependence on wheelchair: Secondary | ICD-10-CM | POA: Insufficient documentation

## 2018-07-09 DIAGNOSIS — F0390 Unspecified dementia without behavioral disturbance: Secondary | ICD-10-CM | POA: Diagnosis not present

## 2018-07-09 DIAGNOSIS — I447 Left bundle-branch block, unspecified: Secondary | ICD-10-CM | POA: Insufficient documentation

## 2018-07-09 DIAGNOSIS — I482 Chronic atrial fibrillation, unspecified: Secondary | ICD-10-CM | POA: Diagnosis not present

## 2018-07-09 DIAGNOSIS — I10 Essential (primary) hypertension: Secondary | ICD-10-CM | POA: Diagnosis present

## 2018-07-09 DIAGNOSIS — E859 Amyloidosis, unspecified: Secondary | ICD-10-CM | POA: Insufficient documentation

## 2018-07-09 DIAGNOSIS — Z9842 Cataract extraction status, left eye: Secondary | ICD-10-CM | POA: Insufficient documentation

## 2018-07-09 DIAGNOSIS — I700 Atherosclerosis of aorta: Secondary | ICD-10-CM | POA: Insufficient documentation

## 2018-07-09 DIAGNOSIS — F329 Major depressive disorder, single episode, unspecified: Secondary | ICD-10-CM | POA: Diagnosis not present

## 2018-07-09 DIAGNOSIS — F419 Anxiety disorder, unspecified: Secondary | ICD-10-CM | POA: Insufficient documentation

## 2018-07-09 DIAGNOSIS — N3289 Other specified disorders of bladder: Secondary | ICD-10-CM | POA: Insufficient documentation

## 2018-07-09 DIAGNOSIS — Z880 Allergy status to penicillin: Secondary | ICD-10-CM | POA: Insufficient documentation

## 2018-07-09 DIAGNOSIS — E876 Hypokalemia: Secondary | ICD-10-CM | POA: Diagnosis not present

## 2018-07-09 DIAGNOSIS — Z8673 Personal history of transient ischemic attack (TIA), and cerebral infarction without residual deficits: Secondary | ICD-10-CM | POA: Insufficient documentation

## 2018-07-09 DIAGNOSIS — Z87442 Personal history of urinary calculi: Secondary | ICD-10-CM | POA: Diagnosis not present

## 2018-07-09 DIAGNOSIS — K219 Gastro-esophageal reflux disease without esophagitis: Secondary | ICD-10-CM | POA: Insufficient documentation

## 2018-07-09 DIAGNOSIS — I272 Pulmonary hypertension, unspecified: Secondary | ICD-10-CM | POA: Insufficient documentation

## 2018-07-09 DIAGNOSIS — I4891 Unspecified atrial fibrillation: Secondary | ICD-10-CM | POA: Diagnosis present

## 2018-07-09 DIAGNOSIS — Z823 Family history of stroke: Secondary | ICD-10-CM | POA: Insufficient documentation

## 2018-07-09 LAB — COMPREHENSIVE METABOLIC PANEL
ALT: 16 U/L (ref 0–44)
AST: 26 U/L (ref 15–41)
Albumin: 3.9 g/dL (ref 3.5–5.0)
Alkaline Phosphatase: 85 U/L (ref 38–126)
Anion gap: 10 (ref 5–15)
BUN: 17 mg/dL (ref 8–23)
CO2: 28 mmol/L (ref 22–32)
Calcium: 9.8 mg/dL (ref 8.9–10.3)
Chloride: 101 mmol/L (ref 98–111)
Creatinine, Ser: 1.35 mg/dL — ABNORMAL HIGH (ref 0.44–1.00)
GFR calc Af Amer: 39 mL/min — ABNORMAL LOW (ref >60)
GFR calc non Af Amer: 34 mL/min — ABNORMAL LOW (ref >60)
Glucose, Bld: 142 mg/dL — ABNORMAL HIGH (ref 70–99)
Potassium: 2.3 mmol/L — CL (ref 3.5–5.1)
Sodium: 139 mmol/L (ref 135–145)
Total Bilirubin: 0.9 mg/dL (ref 0.3–1.2)
Total Protein: 7.4 g/dL (ref 6.5–8.1)

## 2018-07-09 LAB — CBC WITH DIFFERENTIAL/PLATELET
Abs Immature Granulocytes: 0.03 10*3/uL (ref 0.00–0.07)
Basophils Absolute: 0 10*3/uL (ref 0.0–0.1)
Basophils Relative: 0 %
Eosinophils Absolute: 0.1 10*3/uL (ref 0.0–0.5)
Eosinophils Relative: 1 %
HCT: 39 % (ref 36.0–46.0)
Hemoglobin: 13.6 g/dL (ref 12.0–15.0)
Immature Granulocytes: 0 %
Lymphocytes Relative: 18 %
Lymphs Abs: 1.8 10*3/uL (ref 0.7–4.0)
MCH: 30.8 pg (ref 26.0–34.0)
MCHC: 34.9 g/dL (ref 30.0–36.0)
MCV: 88.2 fL (ref 80.0–100.0)
Monocytes Absolute: 0.6 10*3/uL (ref 0.1–1.0)
Monocytes Relative: 6 %
Neutro Abs: 7.2 10*3/uL (ref 1.7–7.7)
Neutrophils Relative %: 75 %
Platelets: 252 10*3/uL (ref 150–400)
RBC: 4.42 MIL/uL (ref 3.87–5.11)
RDW: 14.6 % (ref 11.5–15.5)
WBC: 9.7 10*3/uL (ref 4.0–10.5)
nRBC: 0 % (ref 0.0–0.2)

## 2018-07-09 LAB — MAGNESIUM: Magnesium: 1.9 mg/dL (ref 1.7–2.4)

## 2018-07-09 LAB — I-STAT CG4 LACTIC ACID, ED
Lactic Acid, Venous: 1.95 mmol/L — ABNORMAL HIGH (ref 0.5–1.9)
Lactic Acid, Venous: 1.31 mmol/L (ref 0.5–1.9)

## 2018-07-09 LAB — LIPASE, BLOOD: Lipase: 28 U/L (ref 11–51)

## 2018-07-09 MED ORDER — POTASSIUM CHLORIDE CRYS ER 20 MEQ PO TBCR
60.0000 meq | EXTENDED_RELEASE_TABLET | Freq: Once | ORAL | Status: AC
  Administered 2018-07-09: 60 meq via ORAL
  Filled 2018-07-09: qty 3

## 2018-07-09 MED ORDER — SODIUM CHLORIDE 0.9 % IV SOLN
INTRAVENOUS | Status: DC
  Administered 2018-07-09: 23:00:00 via INTRAVENOUS

## 2018-07-09 MED ORDER — ONDANSETRON HCL 4 MG/2ML IJ SOLN
4.0000 mg | Freq: Once | INTRAMUSCULAR | Status: AC
  Administered 2018-07-09: 4 mg via INTRAVENOUS
  Filled 2018-07-09: qty 2

## 2018-07-09 MED ORDER — POTASSIUM CHLORIDE 10 MEQ/100ML IV SOLN
10.0000 meq | INTRAVENOUS | Status: AC
  Administered 2018-07-09 – 2018-07-10 (×2): 10 meq via INTRAVENOUS
  Filled 2018-07-09 (×3): qty 100

## 2018-07-09 NOTE — ED Provider Notes (Signed)
[facility] EMERGENCY DEPARTMENT Provider Note   CSN: [id number] Arrival date & time: 07/09/18  1857     History   Chief Complaint Chief Complaint  Patient presents with  . Emesis    HPI Carrie Best is a 84 y.o. female.  HPI   84 year old female with nausea and vomiting.  Intermittent for the past week or so.  Family reports that she has become progressively weaker.  No fevers.  No diarrhea.  No sick contacts.  No blood in her stool or emesis.  She denies any abdominal pain.  She does not feel distended.  Past Medical History:  Diagnosis Date  . Anxiety   . Arthritis   . Congestive heart failure (HCC)    Congestive heart failure symptoms  . Depression   . Diastolic dysfunction   . Dyspnea    occasionally, with exertion  . Dysrhythmia 05-10-12   hx. A. Fib., rate is controlled-tx. Xarelto  . GERD (gastroesophageal reflux disease) 05-10-12   tx. with meds as needed  . Hearing loss 05-10-12   bilateral hearing aids.  . Heart murmur   . History of kidney stones   . Hyperlipidemia   . Hypertension   . Pneumonia 05-10-12   dx. in July- tx. antibiotics  . Stroke (HCC) 05-10-12   Lt.CVA note on scans-hx. freq. falls/ none past 6 months 01/21/14  . Wears hearing aid    bilateral    Patient Active Problem List   Diagnosis Date Noted  . Hypokalemia 12/25/2017  . Acute on chronic diastolic CHF (congestive heart failure) (HCC) 12/24/2017  . 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 Rupture of right patellar tendon 05/19/2016  . H/O nonunion of fracture 03/16/2016  . Arthritis of knee 02/17/2015  . Primary osteoarthritis of right knee Valgus 02/14/2015  . Chronic diastolic CHF (congestive heart failure) (HCC) 02/06/2015  . Proximal humerus fracture 06/10/2014  . Obesity 03/18/2012  . HTN (hypertension) 03/18/2012  . GERD (gastroesophageal reflux disease) 03/18/2012  . Hyperlipidemia 03/18/2012  . Sleep disturbance 03/18/2012  . Atrial  fibrillation (HCC) 03/18/2012    Past Surgical History:  Procedure Laterality Date  . APPENDECTOMY    . CATARACT EXTRACTION, BILATERAL  05-10-12   bilateral  . CYSTOSCOPY WITH RETROGRADE PYELOGRAM, URETEROSCOPY AND STENT PLACEMENT Right 01/31/2014   Procedure: CYSTOSCOPY WITH bilateral  RETROGRADE PYELOGRAM, right URETEROSCOPY AND right  STENT PLACEMENT, bladder biopsy with fulgeration;  Surgeon: Sebastian Ache, MD;  Location: [facility];  Service: Urology;  Laterality: Right;  . HARDWARE REMOVAL Right 09/09/2014   Procedure: HARDWARE REMOVAL RIGHT SHOULDER;  Surgeon: Mable Paris, MD;  Location: [facility];  Service: Orthopedics;  Laterality: Right;  Right shoulder hardware removal  . HOLMIUM LASER APPLICATION Right 01/31/2014   Procedure: HOLMIUM LASER APPLICATION;  Surgeon: Sebastian Ache, MD;  Location: [facility];  Service: Urology;  Laterality: Right;  . HUMERUS IM NAIL Right 06/10/2014   Procedure: INTRAMEDULLARY (IM) NAIL HUMERAL;  Surgeon: Mable Paris, MD;  Location: [facility];  Service: Orthopedics;  Laterality: Right;  Right intramedullary humeral nail  . INTERSTIM IMPLANT PLACEMENT  05-10-12   now malfunctioning  . INTERSTIM IMPLANT REMOVAL  05/16/2012   Procedure: REMOVAL OF INTERSTIM IMPLANT;  Surgeon: Martina Sinner, MD;  Location: [facility];  Service: Urology;  Laterality: N/A;  . JOINT REPLACEMENT    . OTHER SURGICAL HISTORY     Bladder Surgery  . SHOULDER SURGERY    . TOTAL KNEE  ARTHROPLASTY Right 02/17/2015   Procedure: TOTAL KNEE ARTHROPLASTY;  Surgeon: Gean Birchwood, MD;  [facility];  Service: Orthopedics;  Laterality: Right;     OB History   None      Home Medications    Prior to Admission medications   Medication Sig Start Date End Date Taking? Authorizing Provider  CARTIA XT 180 MG 24 hr capsule Take 180 mg by mouth daily.  11/01/16   [provider]  DULoxetine (CYMBALTA) 60 MG capsule Take 60 mg by mouth every morning.      [provider]  isosorbide mononitrate (IMDUR) 30 MG 24 hr tablet Take 1 tablet (30 mg total) by mouth daily. 07/03/18 06/28/19  Nahser, Deloris Ping, MD  LORazepam (ATIVAN) 0.5 MG tablet Take 0.5 mg by mouth every 8 (eight) hours as needed for anxiety.    [provider]  meloxicam (MOBIC) 15 MG tablet Take 15 mg by mouth daily. 01/06/15   [provider]  metoprolol tartrate (LOPRESSOR) 25 MG tablet Take 50 mg by mouth 2 (two) times daily.  03/23/12   Kirby Funk, MD  potassium chloride SA (K-DUR,KLOR-CON) 20 MEQ tablet Take 1 tablet (20 mEq total) by mouth daily. 01/28/15 07/03/18  Zannie Cove, MD  torsemide (DEMADEX) 20 MG tablet Take 1 tablet (20 mg total) by mouth 2 (two) times daily. 07/03/18 10/01/18  Nahser, Deloris Ping, MD  zolpidem (AMBIEN) 10 MG tablet Take 10 mg by mouth at bedtime. For sleep    [provider]    Family History Family History  Problem Relation Age of Onset  . Stroke Mother   . Stroke Brother     Social History Social History   Tobacco Use  . Smoking status: Never Smoker  . Smokeless tobacco: Never Used  Substance Use Topics  . Alcohol use: No  . Drug use: No     Allergies   Codeine; Penicillins; and Sulfur   Review of Systems Review of Systems All systems reviewed and negative, other than as noted in HPI.   Physical Exam Updated Vital Signs BP 124/70 (BP Location: Left Arm)   Pulse 69   Temp 98.1 F (36.7 C) (Oral)   Resp 16   SpO2 97%   Physical Exam  Constitutional: She appears well-developed and well-nourished.  Laying in bed.  Appears tired, but not distressed.  Hard of hearing.  HENT:  Head: Normocephalic and atraumatic.  Eyes: Conjunctivae are normal. Right eye exhibits no discharge. Left eye exhibits no discharge.  Neck: Neck supple.  Cardiovascular: Normal rate, regular rhythm and normal heart sounds. Exam reveals no gallop and no friction rub.  No murmur heard. Pulmonary/Chest: Effort normal  and breath sounds normal. No respiratory distress.  Abdominal: Soft. She exhibits no distension. There is no tenderness.  Musculoskeletal: She exhibits no edema or tenderness.  Neurological: She is alert.  Skin: Skin is warm and dry.  Psychiatric: She has a normal mood and affect. Her behavior is normal. Thought content normal.  Nursing note and vitals reviewed.    ED Treatments / Results  Labs (all labs ordered are listed, but only abnormal results are displayed) Labs Reviewed  COMPREHENSIVE METABOLIC PANEL - Abnormal; Notable for the following components:      Result Value   Potassium 2.3 (*)    Glucose, Bld 142 (*)    Creatinine, Ser 1.35 (*)    GFR calc non Af Amer 34 (*)    GFR calc Af Amer 39 (*)  All other components within normal limits  URINALYSIS, ROUTINE W REFLEX MICROSCOPIC - Abnormal; Notable for the following components:   Leukocytes, UA SMALL (*)    Bacteria, UA RARE (*)    All other components within normal limits  BASIC METABOLIC PANEL - Abnormal; Notable for the following components:   Potassium 3.2 (*)    Glucose, Bld 111 (*)    Creatinine, Ser 1.42 (*)    GFR calc non Af Amer 32 (*)    GFR calc Af Amer 37 (*)    All other components within normal limits  CBC - Abnormal; Notable for the following components:   WBC 10.9 (*)    All other components within normal limits  HEPATIC FUNCTION PANEL - Abnormal; Notable for the following components:   Total Bilirubin 1.5 (*)    Bilirubin, Direct 0.4 (*)    Indirect Bilirubin 1.1 (*)    All other components within normal limits  CBC WITH DIFFERENTIAL/PLATELET - Abnormal; Notable for the following components:   RBC 3.85 (*)    Hemoglobin 11.3 (*)    HCT 35.2 (*)    All other components within normal limits  COMPREHENSIVE METABOLIC PANEL - Abnormal; Notable for the following components:   Glucose, Bld 112 (*)    Creatinine, Ser 1.53 (*)    Total Protein 5.7 (*)    Albumin 3.2 (*)    GFR calc non Af Amer 29  (*)    GFR calc Af Amer 34 (*)    Anion gap 4 (*)    All other components within normal limits  I-STAT CG4 LACTIC ACID, ED - Abnormal; Notable for the following components:   Lactic Acid, Venous 1.95 (*)    All other components within normal limits  LIPASE, BLOOD  CBC WITH DIFFERENTIAL/PLATELET  MAGNESIUM  I-STAT CG4 LACTIC ACID, ED    EKG EKG Interpretation  Date/Time:  Sunday July 09 2018 23:58:20 EST Ventricular Rate:  68 PR Interval:    QRS Duration: 144 QT Interval:  475 QTC Calculation: 506 R Axis:   -68 Text Interpretation:  Atrial fibrillation Left bundle branch block                                Confirmed by Raeford Razor [phone]) on 07/10/2018 12:02:19 AM Also confirmed by Raeford Razor [phone]), editor Elita Quick ([id number])  on 07/10/2018 7:32:30 AM   Radiology No results found.  Procedures Procedures (including critical care time)  CRITICAL CARE Performed by: Raeford Razor Total critical care time: 35 minutes Critical care time was exclusive of separately billable procedures and treating other patients. Critical care was necessary to treat or prevent imminent or life-threatening deterioration. Critical care was time spent personally by me on the following activities: development of treatment plan with patient and/or surrogate as well as nursing, discussions with consultants, evaluation of patient's response to treatment, examination of patient, obtaining history from patient or surrogate, ordering and performing treatments and interventions, ordering and review of laboratory studies, ordering and review of radiographic studies, pulse oximetry and re-evaluation of patient's condition.   Medications Ordered in ED Medications  potassium chloride 10 mEq in 100 mL IVPB (0 mEq Intravenous Stopped 07/10/18 0129)  potassium chloride SA (K-DUR,KLOR-CON) CR tablet 60 mEq (60 mEq Oral Given 07/09/18 2321)  ondansetron (ZOFRAN) injection 4 mg (4 mg Intravenous  Given 07/09/18 2319)  potassium chloride 10 mEq in 100 mL IVPB (10 mEq Intravenous New  Bag/Given 07/10/18 0437)  potassium chloride SA (K-DUR,KLOR-CON) CR tablet 40 mEq (40 mEq Oral Given 07/10/18 2147)     Initial Impression / Assessment and Plan / ED Course  I have reviewed the triage vital signs and the nursing notes.  Pertinent labs & imaging results that were available during my care of the patient were reviewed by me and considered in my medical decision making (see chart for details).     84 year old female with nausea and vomiting.  Viral GI illness?  Abdominal exam is fairly benign.  She does have a previous history of abdominal surgeries.  She does not seem distended on exam but will obtain some plain films to evaluate for possible obstructive signs.  She reports that she is having bowel movements.  Noted to be critically hypokalemic.  Will begin supplementation.  Will need admitted for ongoing treatment.  Final Clinical Impressions(s) / ED Diagnoses   Final diagnoses:  Nausea and vomiting, intractability of vomiting not specified, unspecified vomiting type  Hypokalemia    ED Discharge Orders    None       Raeford Razor, MD 07/20/18 [phone]

## 2018-07-09 NOTE — ED Notes (Signed)
pts family has left

## 2018-07-09 NOTE — ED Notes (Signed)
The pt has had nausea vomiting for the past 1-2 weeks. Pt very hard of hearing  Family at the bedside

## 2018-07-09 NOTE — ED Triage Notes (Addendum)
Per EMS- pt from home, here for eval of emesis X 2 weeks. Pt is wheelchair bound, unable to walk at baseline.  Pt has no abdominal pain and no diarrhea. Pt states she is so thirsty, but after she drinks water she immediately throws up.

## 2018-07-09 NOTE — ED Notes (Signed)
Pt being taken to xray.

## 2018-07-09 NOTE — ED Notes (Addendum)
Pt's daughter banging on Pod A doors.  States pt vomited in waiting room after drinking water and wants to know how much longer.  Explained triage process and informed her that pt should not eat or drink anything until seen by MD.  Informed her that staff at nurse first desk can update her as needed.

## 2018-07-10 ENCOUNTER — Observation Stay (HOSPITAL_BASED_OUTPATIENT_CLINIC_OR_DEPARTMENT_OTHER): Payer: Medicare Other

## 2018-07-10 ENCOUNTER — Observation Stay (HOSPITAL_COMMUNITY): Payer: Medicare Other

## 2018-07-10 ENCOUNTER — Encounter (HOSPITAL_COMMUNITY): Payer: Self-pay | Admitting: Internal Medicine

## 2018-07-10 ENCOUNTER — Other Ambulatory Visit: Payer: Self-pay

## 2018-07-10 DIAGNOSIS — R609 Edema, unspecified: Secondary | ICD-10-CM

## 2018-07-10 DIAGNOSIS — E876 Hypokalemia: Secondary | ICD-10-CM | POA: Diagnosis not present

## 2018-07-10 DIAGNOSIS — R112 Nausea with vomiting, unspecified: Secondary | ICD-10-CM | POA: Diagnosis not present

## 2018-07-10 DIAGNOSIS — N179 Acute kidney failure, unspecified: Secondary | ICD-10-CM | POA: Diagnosis not present

## 2018-07-10 LAB — HEPATIC FUNCTION PANEL
ALT: 11 U/L (ref 0–44)
AST: 28 U/L (ref 15–41)
Albumin: 3.6 g/dL (ref 3.5–5.0)
Alkaline Phosphatase: 77 U/L (ref 38–126)
BILIRUBIN DIRECT: 0.4 mg/dL — AB (ref 0.0–0.2)
BILIRUBIN INDIRECT: 1.1 mg/dL — AB (ref 0.3–0.9)
Total Bilirubin: 1.5 mg/dL — ABNORMAL HIGH (ref 0.3–1.2)
Total Protein: 6.5 g/dL (ref 6.5–8.1)

## 2018-07-10 LAB — CBC
HCT: 37.9 % (ref 36.0–46.0)
Hemoglobin: 12.9 g/dL (ref 12.0–15.0)
MCH: 30.3 pg (ref 26.0–34.0)
MCHC: 34 g/dL (ref 30.0–36.0)
MCV: 89 fL (ref 80.0–100.0)
PLATELETS: 228 10*3/uL (ref 150–400)
RBC: 4.26 MIL/uL (ref 3.87–5.11)
RDW: 14.6 % (ref 11.5–15.5)
WBC: 10.9 10*3/uL — AB (ref 4.0–10.5)
nRBC: 0 % (ref 0.0–0.2)

## 2018-07-10 LAB — BASIC METABOLIC PANEL
Anion gap: 9 (ref 5–15)
BUN: 20 mg/dL (ref 8–23)
CHLORIDE: 100 mmol/L (ref 98–111)
CO2: 30 mmol/L (ref 22–32)
Calcium: 9.5 mg/dL (ref 8.9–10.3)
Creatinine, Ser: 1.42 mg/dL — ABNORMAL HIGH (ref 0.44–1.00)
GFR calc non Af Amer: 32 mL/min — ABNORMAL LOW (ref >60)
GFR, EST AFRICAN AMERICAN: 37 mL/min — AB (ref >60)
Glucose, Bld: 111 mg/dL — ABNORMAL HIGH (ref 70–99)
Potassium: 3.2 mmol/L — ABNORMAL LOW (ref 3.5–5.1)
SODIUM: 139 mmol/L (ref 135–145)

## 2018-07-10 MED ORDER — ZOLPIDEM TARTRATE 5 MG PO TABS
5.0000 mg | ORAL_TABLET | Freq: Every day | ORAL | Status: DC
  Administered 2018-07-10 (×2): 5 mg via ORAL
  Filled 2018-07-10 (×2): qty 1

## 2018-07-10 MED ORDER — ENOXAPARIN SODIUM 40 MG/0.4ML ~~LOC~~ SOLN
40.0000 mg | SUBCUTANEOUS | Status: DC
  Administered 2018-07-10: 40 mg via SUBCUTANEOUS
  Filled 2018-07-10: qty 0.4

## 2018-07-10 MED ORDER — METOPROLOL TARTRATE 50 MG PO TABS
50.0000 mg | ORAL_TABLET | Freq: Two times a day (BID) | ORAL | Status: DC
  Administered 2018-07-10 – 2018-07-11 (×4): 50 mg via ORAL
  Filled 2018-07-10 (×4): qty 1

## 2018-07-10 MED ORDER — DULOXETINE HCL 60 MG PO CPEP
60.0000 mg | ORAL_CAPSULE | Freq: Every day | ORAL | Status: DC
  Administered 2018-07-10 – 2018-07-11 (×2): 60 mg via ORAL
  Filled 2018-07-10 (×2): qty 1

## 2018-07-10 MED ORDER — DILTIAZEM HCL ER COATED BEADS 180 MG PO CP24
180.0000 mg | ORAL_CAPSULE | Freq: Every day | ORAL | Status: DC
  Administered 2018-07-10 – 2018-07-11 (×2): 180 mg via ORAL
  Filled 2018-07-10 (×2): qty 1

## 2018-07-10 MED ORDER — HYDROCODONE-ACETAMINOPHEN 5-325 MG PO TABS: 1.0000 | ORAL_TABLET | Freq: Every day | ORAL | Status: DC | PRN

## 2018-07-10 MED ORDER — ACETAMINOPHEN 650 MG RE SUPP: 650.0000 mg | Freq: Four times a day (QID) | RECTAL | Status: DC | PRN

## 2018-07-10 MED ORDER — POTASSIUM CHLORIDE CRYS ER 20 MEQ PO TBCR
40.0000 meq | EXTENDED_RELEASE_TABLET | Freq: Two times a day (BID) | ORAL | Status: AC
  Administered 2018-07-10 (×2): 40 meq via ORAL
  Filled 2018-07-10 (×2): qty 2

## 2018-07-10 MED ORDER — ONDANSETRON HCL 4 MG/2ML IJ SOLN: 4.0000 mg | Freq: Four times a day (QID) | INTRAMUSCULAR | Status: DC | PRN

## 2018-07-10 MED ORDER — POTASSIUM CHLORIDE 10 MEQ/100ML IV SOLN
10.0000 meq | INTRAVENOUS | Status: AC
  Administered 2018-07-10 (×2): 10 meq via INTRAVENOUS
  Filled 2018-07-10: qty 100

## 2018-07-10 MED ORDER — LORAZEPAM 0.5 MG PO TABS: 0.5000 mg | ORAL_TABLET | Freq: Every day | ORAL | Status: DC | PRN

## 2018-07-10 MED ORDER — ISOSORBIDE MONONITRATE ER 30 MG PO TB24
30.0000 mg | ORAL_TABLET | Freq: Every day | ORAL | Status: DC
  Administered 2018-07-10 – 2018-07-11 (×2): 30 mg via ORAL
  Filled 2018-07-10 (×2): qty 1

## 2018-07-10 MED ORDER — ACETAMINOPHEN 325 MG PO TABS: 650.0000 mg | ORAL_TABLET | Freq: Four times a day (QID) | ORAL | Status: DC | PRN

## 2018-07-10 NOTE — H&P (Signed)
History and Physical    Carrie Best ZOX:[id number] DOB: Jul 27, 1928 DOA: 07/09/2018  PCP: Kirby Funk, MD  Patient coming from: Home.  Chief Complaint: Nausea vomiting.  HPI: Carrie Best is a 84 y.o. female with history of atrial fibrillation, diastolic CHF, hypertension, dementia, bedbound after having knee issues was brought to the ER after patient was having persistent nausea vomiting.  As per the patient's daughter started 2 weeks ago with some nausea which eventually started having nausea vomiting.  Denies any abdominal pain or diarrhea.  Has been having some constipation but did move her bowels yesterday morning.  Last week patient was changed to torsemide by patient's cardiologist for increasing lower extremity edema and shortness of breath for the CHF.  Patient also had been placed on Imdur for the same reason by cardiologist.  ED Course: In the ER abdomen appears benign.  Labs revealed severe hypokalemia for which patient was replaced with oral and IV potassium.  Also has acute renal failure and elevated lactate which improved with mild dehydration.  Acute abdominal series unremarkable.  Since patient has been having persistent nausea vomiting admitted for further observation and potassium replacement.  Review of Systems: As per HPI, rest all negative.   Past Medical History:  Diagnosis Date  . Anxiety   . Arthritis   . Congestive heart failure (HCC)    Congestive heart failure symptoms  . Depression   . Diastolic dysfunction   . Dyspnea    occasionally, with exertion  . Dysrhythmia 05-10-12   hx. A. Fib., rate is controlled-tx. Xarelto  . GERD (gastroesophageal reflux disease) 05-10-12   tx. with meds as needed  . Hearing loss 05-10-12   bilateral hearing aids.  . Heart murmur   . History of kidney stones   . Hyperlipidemia   . Hypertension   . Pneumonia 05-10-12   dx. in July- tx. antibiotics  . Stroke (HCC) 05-10-12   Lt.CVA note on scans-hx. freq. falls/ none  past 6 months 01/21/14  . Wears hearing aid    bilateral    Past Surgical History:  Procedure Laterality Date  . APPENDECTOMY    . CATARACT EXTRACTION, BILATERAL  05-10-12   bilateral  . CYSTOSCOPY WITH RETROGRADE PYELOGRAM, URETEROSCOPY AND STENT PLACEMENT Right 01/31/2014   Procedure: CYSTOSCOPY WITH bilateral  RETROGRADE PYELOGRAM, right URETEROSCOPY AND right  STENT PLACEMENT, bladder biopsy with fulgeration;  Surgeon: Sebastian Ache, MD;  Location: [facility];  Service: Urology;  Laterality: Right;  . HARDWARE REMOVAL Right 09/09/2014   Procedure: HARDWARE REMOVAL RIGHT SHOULDER;  Surgeon: Mable Paris, MD;  Location: [facility];  Service: Orthopedics;  Laterality: Right;  Right shoulder hardware removal  . HOLMIUM LASER APPLICATION Right 01/31/2014   Procedure: HOLMIUM LASER APPLICATION;  Surgeon: Sebastian Ache, MD;  Location: [facility];  Service: Urology;  Laterality: Right;  . HUMERUS IM NAIL Right 06/10/2014   Procedure: INTRAMEDULLARY (IM) NAIL HUMERAL;  Surgeon: Mable Paris, MD;  Location: [facility];  Service: Orthopedics;  Laterality: Right;  Right intramedullary humeral nail  . INTERSTIM IMPLANT PLACEMENT  05-10-12   now malfunctioning  . INTERSTIM IMPLANT REMOVAL  05/16/2012   Procedure: REMOVAL OF INTERSTIM IMPLANT;  Surgeon: Martina Sinner, MD;  Location: [facility];  Service: Urology;  Laterality: N/A;  . JOINT REPLACEMENT    . OTHER SURGICAL HISTORY     Bladder Surgery  . SHOULDER SURGERY    . TOTAL KNEE ARTHROPLASTY Right 02/17/2015   Procedure: TOTAL  KNEE ARTHROPLASTY;  Surgeon: Gean Birchwood, MD;  Location: [facility];  Service: Orthopedics;  Laterality: Right;     reports that she has never smoked. She has never used smokeless tobacco. She reports that she does not drink alcohol or use drugs.  Allergies  Allergen Reactions  . Codeine Nausea And Vomiting  . Penicillins Hives    Has patient had a PCN reaction causing immediate rash,  facial/tongue/throat swelling, SOB or lightheadedness with hypotension: No Has patient had a PCN reaction causing severe rash involving mucus membranes or skin necrosis: No Has patient had a PCN reaction that required hospitalization No Has patient had a PCN reaction occurring within the last 10 years: No If all of the above answers are "NO", then may proceed with Cephalosporin use.   . Sulfur Nausea And Vomiting    Family History  Problem Relation Age of Onset  . Stroke Mother   . Stroke Brother     Prior to Admission medications   Medication Sig Start Date End Date Taking? Authorizing Provider  diltiazem (CARTIA XT) 180 MG 24 hr capsule Take 180 mg by mouth daily.   Yes [provider]  DULoxetine (CYMBALTA) 60 MG capsule Take 60 mg by mouth daily.    Yes [provider]  HYDROcodone-acetaminophen (NORCO/VICODIN) 5-325 MG tablet Take 1 tablet by mouth daily as needed (pain).   Yes [provider]  isosorbide mononitrate (IMDUR) 30 MG 24 hr tablet Take 1 tablet (30 mg total) by mouth daily. 07/03/18 06/28/19 Yes Nahser, Deloris Ping, MD  LORazepam (ATIVAN) 0.5 MG tablet Take 0.5 mg by mouth daily as needed for anxiety (tremors).    Yes [provider]  meloxicam (MOBIC) 15 MG tablet Take 15 mg by mouth daily. 01/06/15  Yes [provider]  metoprolol tartrate (LOPRESSOR) 25 MG tablet Take 50 mg by mouth 2 (two) times daily.  03/23/12  Yes Kirby Funk, MD  potassium chloride SA (K-DUR,KLOR-CON) 20 MEQ tablet Take 1 tablet (20 mEq total) by mouth daily. 01/28/15 08/08/18 Yes Zannie Cove, MD  torsemide (DEMADEX) 20 MG tablet Take 1 tablet (20 mg total) by mouth 2 (two) times daily. 07/03/18 10/01/18 Yes Nahser, Deloris Ping, MD  zolpidem (AMBIEN) 10 MG tablet Take 10 mg by mouth at bedtime. For sleep   Yes [provider]    Physical Exam: Vitals:   07/09/18 2245 07/09/18 2315 07/09/18 2330 07/10/18 0000  BP: 135/68 (!) 149/63 (!) 153/127  (!) 143/67  Pulse: 72 68 74 76  Resp: 17 16 16 11   Temp:      TempSrc:      SpO2: 96% 96% 95% 96%      Constitutional: Moderately built and nourished. Vitals:   07/09/18 2245 07/09/18 2315 07/09/18 2330 07/10/18 0000  BP: 135/68 (!) 149/63 (!) 153/127 (!) 143/67  Pulse: 72 68 74 76  Resp: 17 16 16 11   Temp:      TempSrc:      SpO2: 96% 96% 95% 96%   Eyes: Anicteric no pallor. ENMT: No discharge from the ears eyes nose or mouth. Neck: No mass or.  No neck rigidity but no JVD appreciated. Respiratory: No rhonchi or crepitations. Cardiovascular: S1-S2 heard no murmurs appreciated. Abdomen: Soft nontender bowel sounds present.  No guarding or rigidity. Musculoskeletal: Mild edema. Skin: No rash. Neurologic: Alert awake oriented to time place and person.  Moves all extremities. Psychiatric: Appears normal per normal affect.   Labs on Admission: I have personally reviewed following  labs and imaging studies  CBC: Recent Labs  Lab 07/09/18 1927  WBC 9.7  NEUTROABS 7.2  HGB 13.6  HCT 39.0  MCV 88.2  PLT 252   Basic Metabolic Panel: Recent Labs  Lab 07/09/18 1927  NA 139  K 2.3*  CL 101  CO2 28  GLUCOSE 142*  BUN 17  CREATININE 1.35*  CALCIUM 9.8  MG 1.9   GFR: Estimated Creatinine Clearance: 32.5 mL/min (A) (by C-G formula based on SCr of 1.35 mg/dL (H)). Liver Function Tests: Recent Labs  Lab 07/09/18 1927  AST 26  ALT 16  ALKPHOS 85  BILITOT 0.9  PROT 7.4  ALBUMIN 3.9   Recent Labs  Lab 07/09/18 1927  LIPASE 28   No results for input(s): AMMONIA in the last 168 hours. Coagulation Profile: No results for input(s): INR, PROTIME in the last 168 hours. Cardiac Enzymes: No results for input(s): CKTOTAL, CKMB, CKMBINDEX, TROPONINI in the last 168 hours. BNP (last 3 results) No results for input(s): PROBNP in the last 8760 hours. HbA1C: No results for input(s): HGBA1C in the last 72 hours. CBG: No results for input(s): GLUCAP in the last 168  hours. Lipid Profile: No results for input(s): CHOL, HDL, LDLCALC, TRIG, CHOLHDL, LDLDIRECT in the last 72 hours. Thyroid Function Tests: No results for input(s): TSH, T4TOTAL, FREET4, T3FREE, THYROIDAB in the last 72 hours. Anemia Panel: No results for input(s): VITAMINB12, FOLATE, FERRITIN, TIBC, IRON, RETICCTPCT in the last 72 hours. Urine analysis:    Component Value Date/Time   COLORURINE YELLOW 02/07/2015 1534   APPEARANCEUR CLEAR 02/07/2015 1534   LABSPEC 1.007 02/07/2015 1534   PHURINE 7.0 02/07/2015 1534   GLUCOSEU NEGATIVE 02/07/2015 1534   HGBUR NEGATIVE 02/07/2015 1534   BILIRUBINUR NEGATIVE 02/07/2015 1534   KETONESUR NEGATIVE 02/07/2015 1534   PROTEINUR NEGATIVE 02/07/2015 1534   UROBILINOGEN 0.2 02/07/2015 1534   NITRITE NEGATIVE 02/07/2015 1534   LEUKOCYTESUR TRACE (A) 02/07/2015 1534   Sepsis Labs: @LABRCNTIP (procalcitonin:4,lacticidven:4) )No results found for this or any previous visit (from the past 240 hour(s)).   Radiological Exams on Admission: Dg Abdomen 1 View  Result Date: 07/09/2018 CLINICAL DATA:  Nausea and vomiting for 2 weeks. EXAM: ABDOMEN - 1 VIEW COMPARISON:  CT abdomen and pelvis 12/27/2013 FINDINGS: Scattered bowel gas is noted throughout the abdomen. No dilated loops of bowel are seen to suggest obstruction. Chronic coarse calcification is again noted in the right adnexa. No acute osseous abnormality is seen. IMPRESSION: No evidence of bowel obstruction. Electronically Signed   By: Sebastian Ache M.D.   On: 07/09/2018 22:21    EKG: Independently reviewed.  A. fib with LBBB.  Assessment/Plan Principal Problem:   Nausea & vomiting Active Problems:   HTN (hypertension)   Atrial fibrillation (HCC)   Chronic diastolic CHF (congestive heart failure) (HCC)   Hypokalemia   ARF (acute renal failure) (HCC)    1. Intractable nausea vomiting -has not had any sick contacts and nobody sick at home.  Since patient has benign persistent symptoms for  last 2 weeks will check CT abdomen and pelvis with oral contrast.  For now I have kept patient on full liquid diet.  Abdomen on exam appears benign.  UA is pending. 2. Severe hypokalemia likely from patient having persistent vomiting and taking new medication torsemide in addition to the vomiting.  Patient did receive fluids in the ER would hold off any further fluids given the diastolic CHF and mild edema in the lower extremities.  Replace potassium  and recheck. 3. Acute renal failure -IV from poor oral intake vomiting and diuretics.  See #2.  Will hold off further fluids given the diastolic CHF did receive some fluids in the ER.  Holding her torsemide until we correct potassium. 4. Chronic diastolic CHF last EF measured was 60 to 65% in July 2018.  Holding torsemide due to severe hypokalemia.  Will continue Imdur. 5. Hypertension on Imdur Cardizem and metoprolol. 6. Chronic atrial fibrillation on Cardizem metoprolol.  Not sure why patient is not on anticoagulation.  Cardiology notes reviewed.  Discussed with patient's daughter with whom patient lives about any previous bleed but has not had any as per the patient's daughter. 7. Dementia no acute issues at this time.  UA and CT abdomen is pending. Torsemide is on hold until we correct potassium.   DVT prophylaxis: Lovenox. Code Status: Full code. Family Communication: Patient's daughter. Disposition Plan: Home. Consults called: None. Admission status: Observation.   Eduard Clos MD [facility] Pager [phone].  If 7PM-7AM, please contact night-coverage [email or web address] Password [email or web address]  07/10/2018, 12:19 AM

## 2018-07-10 NOTE — Plan of Care (Signed)
  Problem: Nutrition: Goal: Adequate nutrition will be maintained Outcome: Progressing   

## 2018-07-10 NOTE — Progress Notes (Signed)
VASCULAR LAB PRELIMINARY  PRELIMINARY  PRELIMINARY  PRELIMINARY  Bilateral lower extremity venous duplex completed.    Preliminary report:  There is no DVT or SVT noted in the bilateral lower extremities.   Doyel Mulkern, RVT 07/10/2018, 8:33 AM

## 2018-07-10 NOTE — Progress Notes (Addendum)
New Admission Note:  Arrival Method: By bed from ED around 0130 Mental Orientation: Alert and oriented Telemetry: Box 17, CCMD notified Assessment: Completed Skin: Completed, refer to flowsheets IV: Right wrist Pain: Denies Tubes: None Safety Measures: Safety Fall Prevention Plan was given, discussed  Admission: Completed [facility] Orientation: Patient has been orientated to the room, unit and the staff. Family: None   Orders have been reviewed and implemented. Will continue to monitor the patient. Call light has been placed within reach and bed alarm has been activated.   Alfonse Ras, RN  Phone Number: [phone]

## 2018-07-10 NOTE — ED Notes (Signed)
Report called to rn on  5 m 16

## 2018-07-10 NOTE — Progress Notes (Addendum)
Carrie Best is a 84 y.o. female with history of atrial fibrillation, diastolic CHF, hypertension, dementia, bedbound after having knee issues was brought to the ER after patient was having persistent nausea vomiting.  As per the patient's daughter started 2 weeks ago with some nausea which eventually started having nausea vomiting.  Denies any abdominal pain or diarrhea.  Has been having some constipation but did move her bowels yesterday morning.  Last week patient was changed to torsemide by patient's cardiologist for increasing lower extremity edema and shortness of breath for the CHF.  Patient also had been placed on Imdur for the same reason by cardiologist.  07/10/2018: Patient seen and examined with her granddaughter at bedside.  Symptoms have resolved.  Potassium improved to 3.2.  Given 2 doses of potassium supplement 40 mEq x 2.  Denies any cardiopulmonary symptoms.  Incidental findings on CT abdomen and pelvis with no contrast done today 07/10/2018 which revealed bladder wall thickening with calcification with previous biopsies positive for amyloidosis.  Discussed with Dr. Liliane Shi of urology who will see the patient outpatient for possible cystoscopy.  Also gallstones incidentally noted on CT abdomen and pelvis without contrast.  No evidence of inflammatory changes.  No significant biliary dilatation.  Patient denies any abdominal pain and her nausea has resolved.  Please refer to H&P dictated by Dr. Toniann Fail on 07/10/2018 for further details of the assessment and plan.

## 2018-07-10 NOTE — ED Notes (Signed)
The pt lives with her daughter

## 2018-07-11 ENCOUNTER — Observation Stay (HOSPITAL_BASED_OUTPATIENT_CLINIC_OR_DEPARTMENT_OTHER): Payer: Medicare Other

## 2018-07-11 DIAGNOSIS — I340 Nonrheumatic mitral (valve) insufficiency: Secondary | ICD-10-CM | POA: Diagnosis not present

## 2018-07-11 DIAGNOSIS — I5032 Chronic diastolic (congestive) heart failure: Secondary | ICD-10-CM | POA: Diagnosis not present

## 2018-07-11 DIAGNOSIS — I10 Essential (primary) hypertension: Secondary | ICD-10-CM | POA: Diagnosis not present

## 2018-07-11 DIAGNOSIS — R112 Nausea with vomiting, unspecified: Secondary | ICD-10-CM | POA: Diagnosis not present

## 2018-07-11 DIAGNOSIS — I361 Nonrheumatic tricuspid (valve) insufficiency: Secondary | ICD-10-CM

## 2018-07-11 DIAGNOSIS — N179 Acute kidney failure, unspecified: Secondary | ICD-10-CM | POA: Diagnosis not present

## 2018-07-11 LAB — COMPREHENSIVE METABOLIC PANEL
ALBUMIN: 3.2 g/dL — AB (ref 3.5–5.0)
ALK PHOS: 68 U/L (ref 38–126)
ALT: 11 U/L (ref 0–44)
AST: 20 U/L (ref 15–41)
Anion gap: 4 — ABNORMAL LOW (ref 5–15)
BILIRUBIN TOTAL: 0.9 mg/dL (ref 0.3–1.2)
BUN: 22 mg/dL (ref 8–23)
CALCIUM: 9.6 mg/dL (ref 8.9–10.3)
CO2: 29 mmol/L (ref 22–32)
Chloride: 104 mmol/L (ref 98–111)
Creatinine, Ser: 1.53 mg/dL — ABNORMAL HIGH (ref 0.44–1.00)
GFR calc Af Amer: 34 mL/min — ABNORMAL LOW (ref >60)
GFR calc non Af Amer: 29 mL/min — ABNORMAL LOW (ref >60)
GLUCOSE: 112 mg/dL — AB (ref 70–99)
POTASSIUM: 4.2 mmol/L (ref 3.5–5.1)
SODIUM: 137 mmol/L (ref 135–145)
Total Protein: 5.7 g/dL — ABNORMAL LOW (ref 6.5–8.1)

## 2018-07-11 LAB — URINALYSIS, ROUTINE W REFLEX MICROSCOPIC
BILIRUBIN URINE: NEGATIVE
Glucose, UA: NEGATIVE mg/dL
Hgb urine dipstick: NEGATIVE
Ketones, ur: NEGATIVE mg/dL
NITRITE: NEGATIVE
Protein, ur: NEGATIVE mg/dL
SPECIFIC GRAVITY, URINE: 1.011 (ref 1.005–1.030)
pH: 6 (ref 5.0–8.0)

## 2018-07-11 LAB — CBC WITH DIFFERENTIAL/PLATELET
Abs Immature Granulocytes: 0.02 10*3/uL (ref 0.00–0.07)
BASOS PCT: 0 %
Basophils Absolute: 0 10*3/uL (ref 0.0–0.1)
EOS ABS: 0.2 10*3/uL (ref 0.0–0.5)
EOS PCT: 3 %
HCT: 35.2 % — ABNORMAL LOW (ref 36.0–46.0)
Hemoglobin: 11.3 g/dL — ABNORMAL LOW (ref 12.0–15.0)
IMMATURE GRANULOCYTES: 0 %
Lymphocytes Relative: 30 %
Lymphs Abs: 2.3 10*3/uL (ref 0.7–4.0)
MCH: 29.4 pg (ref 26.0–34.0)
MCHC: 32.1 g/dL (ref 30.0–36.0)
MCV: 91.4 fL (ref 80.0–100.0)
MONO ABS: 0.8 10*3/uL (ref 0.1–1.0)
MONOS PCT: 10 %
NEUTROS PCT: 57 %
Neutro Abs: 4.4 10*3/uL (ref 1.7–7.7)
Platelets: 193 10*3/uL (ref 150–400)
RBC: 3.85 MIL/uL — ABNORMAL LOW (ref 3.87–5.11)
RDW: 14.8 % (ref 11.5–15.5)
WBC: 7.7 10*3/uL (ref 4.0–10.5)
nRBC: 0 % (ref 0.0–0.2)

## 2018-07-11 LAB — ECHOCARDIOGRAM COMPLETE: Weight: 3033.53 oz

## 2018-07-11 MED ORDER — ENOXAPARIN SODIUM 30 MG/0.3ML ~~LOC~~ SOLN
30.0000 mg | SUBCUTANEOUS | Status: DC
  Administered 2018-07-11: 30 mg via SUBCUTANEOUS
  Filled 2018-07-11: qty 0.3

## 2018-07-11 MED ORDER — TORSEMIDE 20 MG PO TABS: 20.0000 mg | ORAL_TABLET | Freq: Every day | ORAL | 0 refills | Status: DC | PRN

## 2018-07-11 NOTE — Progress Notes (Signed)
  Echocardiogram 2D Echocardiogram has been performed.  Carrie Best 07/11/2018, 10:52 AM

## 2018-07-11 NOTE — Discharge Summary (Signed)
Discharge Summary  Carrie Best:[id number] DOB: [birth year]  PCP: Kirby Funk, MD  Admit date: 07/09/2018 Discharge date: 07/11/2018  Time spent: 35 minutes   Recommendations for Outpatient Follow-up:  1. Follow up with Dr Elease Hashimoto in 2 weeks 2. Take your medications as prescribed 3. Discussed with Dr Isabel Caprice who recommended using torsemide as needed for shortness of breath and swelling. When using torsemide also use your potassium supplement 20 meq  Discharge Diagnoses:  Active Hospital Problems   Diagnosis Date Noted  . Nausea & vomiting 07/09/2018  . ARF (acute renal failure) (HCC) 07/10/2018  . Hypokalemia 12/25/2017  . Chronic diastolic CHF (congestive heart failure) (HCC) 02/06/2015  . HTN (hypertension) 03/18/2012  . Atrial fibrillation (HCC) 03/18/2012    Resolved Hospital Problems  No resolved problems to display.    Discharge Condition: stable  Diet recommendation: resume prior diet   Vitals:   07/11/18 1129 07/11/18 1640  BP: (!) 150/74 (!) 117/59  Pulse: 69 (!) 50  Resp: 18 18  Temp: 97.7 F (36.5 C) 98.5 F (36.9 C)  SpO2: 98% 97%    History of present illness:  Carrie Arai Boylesis a 84 y.o.femalewithhistory of atrial fibrillation, diastolic CHF, hypertension, dementia, bedbound after having knee issues was brought to the ER after patient was having persistent nausea vomiting. As per the patient's daughter started 2 weeks ago with some nausea which eventually started having nausea vomiting. Denies any abdominal pain or diarrhea. Has been having some constipation but did move her bowels yesterday morning. Last week patient was changed to torsemide by patient's cardiologist for increasing lower extremity edema and shortness of breath for the CHF. Patient also had been placed on Imdur for the same reason by cardiologist.  07/10/2018: Patient seen and examined with her granddaughter at bedside.  Symptoms have resolved.  Potassium improved to  3.2.  Given 2 doses of potassium supplement 40 mEq x 2.  Denies any cardiopulmonary symptoms.  Incidental findings on CT abdomen and pelvis with no contrast done today 07/10/2018 which revealed bladder wall thickening with calcification with previous biopsies positive for amyloidosis.  Discussed with Dr. Liliane Shi of urology who will see the patient outpatient for possible cystoscopy.  Also gallstones incidentally noted on CT abdomen and pelvis without contrast.  No evidence of inflammatory changes.  No significant biliary dilatation.  Patient denies any abdominal pain and her nausea has resolved.  07/11/18: No acute events overnight. No new complaints. Denies any cardiopulmonary symptoms. Discussed with Dr Isabel Caprice who recommended using torsemide as needed for shortness of breath and swelling due to renal insufficiency. When using torsemide also use your potassium supplement 20 meq. Follow up pwith Dr Elease Hashimoto in 2 weeks.    Hospital Course:  Principal Problem:   Nausea & vomiting Active Problems:   HTN (hypertension)   Atrial fibrillation (HCC)   Chronic diastolic CHF (congestive heart failure) (HCC)   Hypokalemia   ARF (acute renal failure) (HCC)  Intractable nausea and vomiting suspect viral gastroenteritis, resolved Tolerating solid diet Denies recurrent nausea, abdominal pain or vomiting Follow up with your PCP  Hypokalemia 2/2 to vomiting Repleted  AKI suspect prerenal 2/2 to dehydration Held torsemide Discussed with cardiology who recommended usiing as need for shortness of breath or swelling When using torsemide also take potassium supplement  Chronic dCHF Follow up with Dr Elease Hashimoto in 2 weeks Diuretic as stated above Weight yourself daily   Cholelithiasis incidentally found on CT abd pelvis wo contrast No evidence of inflammatory changes Denies  abd pain, nausea has resolved Normal LFTs Afebrile with no leukocytosis  Bladder wall thickening with calfication w previous  biopsie revealing amyloidosis Discussed with urology who will see the patient outpatient  Ambulatory dysfunction  Wheelchair bound Fall precaution  Chronic dCHF No acute issues Follow up with Dr Elease Hashimoto   Procedures:  None  Consultations:  None  Discharge Exam: BP (!) 117/59 (BP Location: Left Arm)   Pulse (!) 50   Temp 98.5 F (36.9 C) (Oral)   Resp 18   Wt 86 kg   SpO2 97%   BMI 28.83 kg/m  . General: 84 y.o. year-old female well developed well nourished in no acute distress.  Alert and oriented x3. . Cardiovascular: Regular rate and rhythm with no rubs or gallops.  No thyromegaly or JVD noted.   Marland Kitchen Respiratory: Clear to auscultation with no wheezes or rales. Good inspiratory effort. . Abdomen: Soft nontender nondistended with normal bowel sounds x4 quadrants. . Musculoskeletal: No lower extremity edema. 2/4 pulses in all 4 extremities. . Skin: No ulcerative lesions noted or rashes, . Psychiatry: Mood is appropriate for condition and setting  Discharge Instructions You were cared for by a hospitalist during your hospital stay. If you have any questions about your discharge medications or the care you received while you were in the hospital after you are discharged, you can call the unit and asked to speak with the hospitalist on call if the hospitalist that took care of you is not available. Once you are discharged, your primary care physician will handle any further medical issues. Please note that NO REFILLS for any discharge medications will be authorized once you are discharged, as it is imperative that you return to your primary care physician (or establish a relationship with a primary care physician if you do not have one) for your aftercare needs so that they can reassess your need for medications and monitor your lab values.   Allergies as of 07/11/2018      Reactions   Codeine Nausea And Vomiting   Penicillins Hives   Has patient had a PCN reaction causing  immediate rash, facial/tongue/throat swelling, SOB or lightheadedness with hypotension: No Has patient had a PCN reaction causing severe rash involving mucus membranes or skin necrosis: No Has patient had a PCN reaction that required hospitalization No Has patient had a PCN reaction occurring within the last 10 years: No If all of the above answers are "NO", then may proceed with Cephalosporin use.   Sulfur Nausea And Vomiting      Medication List    STOP taking these medications   HYDROcodone-acetaminophen 5-325 MG tablet Commonly known as:  NORCO/VICODIN   meloxicam 15 MG tablet Commonly known as:  MOBIC     TAKE these medications   CARTIA XT 180 MG 24 hr capsule Generic drug:  diltiazem Take 180 mg by mouth daily.   DULoxetine 60 MG capsule Commonly known as:  CYMBALTA Take 60 mg by mouth daily.   isosorbide mononitrate 30 MG 24 hr tablet Commonly known as:  IMDUR Take 1 tablet (30 mg total) by mouth daily.   LORazepam 0.5 MG tablet Commonly known as:  ATIVAN Take 0.5 mg by mouth daily as needed for anxiety (tremors).   metoprolol tartrate 25 MG tablet Commonly known as:  LOPRESSOR Take 50 mg by mouth 2 (two) times daily.   potassium chloride SA 20 MEQ tablet Commonly known as:  K-DUR,KLOR-CON Take 1 tablet (20 mEq total) by mouth daily.  torsemide 20 MG tablet Commonly known as:  DEMADEX Take 1 tablet (20 mg total) by mouth daily as needed. What changed:    when to take this  reasons to take this   zolpidem 10 MG tablet Commonly known as:  AMBIEN Take 10 mg by mouth at bedtime. For sleep      Allergies  Allergen Reactions  . Codeine Nausea And Vomiting  . Penicillins Hives    Has patient had a PCN reaction causing immediate rash, facial/tongue/throat swelling, SOB or lightheadedness with hypotension: No Has patient had a PCN reaction causing severe rash involving mucus membranes or skin necrosis: No Has patient had a PCN reaction that required  hospitalization No Has patient had a PCN reaction occurring within the last 10 years: No If all of the above answers are "NO", then may proceed with Cephalosporin use.   Marland Kitchen Sulfur Nausea And Vomiting   Follow-up Information    Nahser, Deloris Ping, MD. Call in 1 day(s).   Specialty:  Cardiology Why:  please call for f/u post hospital appointment in 2 weeks Contact information: [address] [place] [address] [phone]        Jerilee Field, MD. Call in 1 day(s).   Specialty:  Urology Why:  please call for a post hospital follow up appointment Contact information: [address] [place] [address] [phone]        Kirby Funk, MD. Call in 1 day(s).   Specialty:  Internal Medicine Why:  please call for a follow up appointment. Contact information: [address] [place] [address] [phone]            The results of significant diagnostics from this hospitalization (including imaging, microbiology, ancillary and laboratory) are listed below for reference.    Significant Diagnostic Studies: Ct Abdomen Pelvis Wo Contrast  Result Date: 07/10/2018 CLINICAL DATA:  84 year old with nausea and vomiting. EXAM: CT ABDOMEN AND PELVIS WITHOUT CONTRAST TECHNIQUE: Multidetector CT imaging of the abdomen and pelvis was performed following the standard protocol without IV contrast. COMPARISON:  12/27/2013 FINDINGS: Lower chest: Mild atelectasis or scarring at the lung bases. No large pleural effusions. Hepatobiliary: Layering gallstones. There is no significant gallbladder distention and no surrounding inflammatory changes. No significant biliary dilatation. Pancreas: Unremarkable. No pancreatic ductal dilatation or surrounding inflammatory changes. Spleen: Normal in size without focal abnormality. Adrenals/Urinary Tract: Adrenal glands are within normal limits. Bilateral exophytic renal cysts without hydronephrosis. Negative for kidney  or ureter stones. Chronic bladder wall thickening particularly along the anterior aspect. There are new bladder wall calcifications. No significant inflammatory changes around the urinary bladder. The bladder is not distended. Stomach/Bowel: Stomach is within normal limits. Appendix is not confidently identified. No evidence of bowel wall thickening, distention, or inflammatory changes. Vascular/Lymphatic: Atherosclerotic calcifications involving the aorta and visceral arteries. Negative for aortic aneurysm. Venous structures are unremarkable. No significant lymph node enlargement in the abdomen or pelvis. Reproductive: Again noted is a prominent calcification involving the right adnexa. Otherwise, the uterus and adnexal structures are unremarkable. Other: Small umbilical hernia containing fat. Negative for free air. Negative for free fluid. Incidentally, there is increased atrophy or fatty infiltration of the right iliacus muscle. Musculoskeletal: Disc space narrowing and prominent disc bulge at L4-L5. IMPRESSION: 1. Cholelithiasis without evidence of inflammatory changes. No significant biliary dilatation. Gallbladder could be further characterized with ultrasound if this is an area of interest. 2. Chronic bladder wall thickening with new bladder wall calcifications and  difficult to exclude increased bladder wall thickening. Bladder biopsy from 2015 demonstrated amyloidosis. Recommend urology consultation with regards to these findings. 3. No acute bowel abnormalities. 4. Bilateral renal cysts. Electronically Signed   By: Richarda Overlie M.D.   On: 07/10/2018 08:27   Dg Abdomen 1 View  Result Date: 07/09/2018 CLINICAL DATA:  Nausea and vomiting for 2 weeks. EXAM: ABDOMEN - 1 VIEW COMPARISON:  CT abdomen and pelvis 12/27/2013 FINDINGS: Scattered bowel gas is noted throughout the abdomen. No dilated loops of bowel are seen to suggest obstruction. Chronic coarse calcification is again noted in the right adnexa. No  acute osseous abnormality is seen. IMPRESSION: No evidence of bowel obstruction. Electronically Signed   By: Sebastian Ache M.D.   On: 07/09/2018 22:21   Vas [place] Lower Extremity Venous (dvt)  Result Date: 07/10/2018  Lower Venous Study Indications: Edema.  Comparison Study: No prior study on file Performing Technologist: Sherren Kerns RVS  Examination Guidelines: A complete evaluation includes B-mode imaging, spectral Doppler, color Doppler, and power Doppler as needed of all accessible portions of each vessel. Bilateral testing is considered an integral part of a complete examination. Limited examinations for reoccurring indications may be performed as noted.  Right Venous Findings: +---------+---------------+---------+-----------+----------+-------+          CompressibilityPhasicitySpontaneityPropertiesSummary +---------+---------------+---------+-----------+----------+-------+ CFV      Full           Yes      Yes                          +---------+---------------+---------+-----------+----------+-------+ SFJ      Full                                                 +---------+---------------+---------+-----------+----------+-------+ FV Prox  Full                                                 +---------+---------------+---------+-----------+----------+-------+ FV Mid   Full                                                 +---------+---------------+---------+-----------+----------+-------+ FV DistalFull                                                 +---------+---------------+---------+-----------+----------+-------+ PFV      Full                                                 +---------+---------------+---------+-----------+----------+-------+ POP      Full           Yes      Yes                          +---------+---------------+---------+-----------+----------+-------+ PTV  Full                                                  +---------+---------------+---------+-----------+----------+-------+  Left Venous Findings: +---------+---------------+---------+-----------+----------+-------+          CompressibilityPhasicitySpontaneityPropertiesSummary +---------+---------------+---------+-----------+----------+-------+ CFV      Full           Yes      Yes                          +---------+---------------+---------+-----------+----------+-------+ SFJ      Full                                                 +---------+---------------+---------+-----------+----------+-------+ FV Prox  Full                                                 +---------+---------------+---------+-----------+----------+-------+ FV Mid   Full                                                 +---------+---------------+---------+-----------+----------+-------+ FV DistalFull                                                 +---------+---------------+---------+-----------+----------+-------+ PFV      Full                                                 +---------+---------------+---------+-----------+----------+-------+ POP      Full           Yes      Yes                          +---------+---------------+---------+-----------+----------+-------+ PTV      Full                                                 +---------+---------------+---------+-----------+----------+-------+ PERO     Full                                                 +---------+---------------+---------+-----------+----------+-------+    Summary: Right: There is no evidence of deep vein thrombosis in the lower extremity. Left: There is no evidence of deep vein thrombosis in the lower extremity.  *See table(s) above for measurements and observations. Electronically signed by Fabienne Bruns MD on 07/10/2018 at 6:56:47 PM.  Final     Microbiology: No results found for this or any previous visit (from the past 240 hour(s)).    Labs: Basic Metabolic Panel: Recent Labs  Lab 07/09/18 1927 07/10/18 0219 07/11/18 0549  NA 139 139 137  K 2.3* 3.2* 4.2  CL 101 100 104  CO2 28 30 29   GLUCOSE 142* 111* 112*  BUN 17 20 22   CREATININE 1.35* 1.42* 1.53*  CALCIUM 9.8 9.5 9.6  MG 1.9  --   --    Liver Function Tests: Recent Labs  Lab 07/09/18 1927 07/10/18 0219 07/11/18 0549  AST 26 28 20   ALT 16 11 11   ALKPHOS 85 77 68  BILITOT 0.9 1.5* 0.9  PROT 7.4 6.5 5.7*  ALBUMIN 3.9 3.6 3.2*   Recent Labs  Lab 07/09/18 1927  LIPASE 28   No results for input(s): AMMONIA in the last 168 hours. CBC: Recent Labs  Lab 07/09/18 1927 07/10/18 0219 07/11/18 0543  WBC 9.7 10.9* 7.7  NEUTROABS 7.2  --  4.4  HGB 13.6 12.9 11.3*  HCT 39.0 37.9 35.2*  MCV 88.2 89.0 91.4  PLT 252 228 193   Cardiac Enzymes: No results for input(s): CKTOTAL, CKMB, CKMBINDEX, TROPONINI in the last 168 hours. BNP: BNP (last 3 results) Recent Labs    12/24/17 1819  BNP 365.3*    ProBNP (last 3 results) No results for input(s): PROBNP in the last 8760 hours.  CBG: No results for input(s): GLUCAP in the last 168 hours.     Signed:  Darlin Drop, MD [facility] 07/11/2018, 4:47 PM

## 2018-07-11 NOTE — Progress Notes (Signed)
Carrie Best to be D/C'd Home per MD order.  Discussed prescriptions and follow up appointments with the patient. Prescriptions given to patient, medication list explained in detail. Pt verbalized understanding.  Allergies as of 07/11/2018      Reactions   Codeine Nausea And Vomiting   Penicillins Hives   Has patient had a PCN reaction causing immediate rash, facial/tongue/throat swelling, SOB or lightheadedness with hypotension: No Has patient had a PCN reaction causing severe rash involving mucus membranes or skin necrosis: No Has patient had a PCN reaction that required hospitalization No Has patient had a PCN reaction occurring within the last 10 years: No If all of the above answers are "NO", then may proceed with Cephalosporin use.   Sulfur Nausea And Vomiting      Medication List    STOP taking these medications   HYDROcodone-acetaminophen 5-325 MG tablet Commonly known as:  NORCO/VICODIN   meloxicam 15 MG tablet Commonly known as:  MOBIC     TAKE these medications   CARTIA XT 180 MG 24 hr capsule Generic drug:  diltiazem Take 180 mg by mouth daily.   DULoxetine 60 MG capsule Commonly known as:  CYMBALTA Take 60 mg by mouth daily.   isosorbide mononitrate 30 MG 24 hr tablet Commonly known as:  IMDUR Take 1 tablet (30 mg total) by mouth daily.   LORazepam 0.5 MG tablet Commonly known as:  ATIVAN Take 0.5 mg by mouth daily as needed for anxiety (tremors).   metoprolol tartrate 25 MG tablet Commonly known as:  LOPRESSOR Take 50 mg by mouth 2 (two) times daily.   potassium chloride SA 20 MEQ tablet Commonly known as:  K-DUR,KLOR-CON Take 1 tablet (20 mEq total) by mouth daily.   torsemide 20 MG tablet Commonly known as:  DEMADEX Take 1 tablet (20 mg total) by mouth daily as needed. What changed:    when to take this  reasons to take this   zolpidem 10 MG tablet Commonly known as:  AMBIEN Take 10 mg by mouth at bedtime. For sleep       Vitals:   07/11/18 1129 07/11/18 1640  BP: (!) 150/74 (!) 117/59  Pulse: 69 (!) 50  Resp: 18 18  Temp: 97.7 F (36.5 C) 98.5 F (36.9 C)  SpO2: 98% 97%    Skin clean, dry and intact without evidence of skin break down, no evidence of skin tears noted. IV catheter discontinued intact. Site without signs and symptoms of complications. Dressing and pressure applied. Pt denies pain at this time. No complaints noted.  An After Visit Summary was printed and given to the patient. Patient escorted via WC, and D/C home via private auto.

## 2018-07-11 NOTE — Discharge Instructions (Signed)
Cholelithiasis Cholelithiasis is also called "gallstones." It is a kind of gallbladder disease. The gallbladder is an organ that stores a liquid (bile) that helps you digest fat. Gallstones may not cause symptoms (may be silent gallstones) until they cause a blockage, and then they can cause pain (gallbladder attack). Follow these instructions at home:  Take over-the-counter and prescription medicines only as told by your doctor.  Stay at a healthy weight.  Eat healthy foods. This includes: ? Eating fewer fatty foods, like fried foods. ? Eating fewer refined carbs (refined carbohydrates). Refined carbs are breads and grains that are highly processed, like white bread and white rice. Instead, choose whole grains like whole-wheat bread and brown rice. ? Eating more fiber. Almonds, fresh fruit, and beans are healthy sources of fiber.  Keep all follow-up visits as told by your doctor. This is important. Contact a doctor if:  You have sudden pain in the upper right side of your belly (abdomen). Pain might spread to your right shoulder or your chest. This may be a sign of a gallbladder attack.  You feel sick to your stomach (are nauseous).  You throw up (vomit).  You have been diagnosed with gallstones that have no symptoms and you get: ? Belly pain. ? Discomfort, burning, or fullness in the upper part of your belly (indigestion). Get help right away if:  You have sudden pain in the upper right side of your belly, and it lasts for more than 2 hours.  You have belly pain that lasts for more than 5 hours.  You have a fever or chills.  You keep feeling sick to your stomach or you keep throwing up.  Your skin or the whites of your eyes turn yellow (jaundice).  You have dark-colored pee (urine).  You have light-colored poop (stool). Summary  Cholelithiasis is also called "gallstones."  The gallbladder is an organ that stores a liquid (bile) that helps you digest fat.  Silent  gallstones are gallstones that do not cause symptoms.  A gallbladder attack may cause sudden pain in the upper right side of your belly. Pain might spread to your right shoulder or your chest. If this happens, contact your doctor.  If you have sudden pain in the upper right side of your belly that lasts for more than 2 hours, get help right away. This information is not intended to replace advice given to you by your health care provider. Make sure you discuss any questions you have with your health care provider. Document Released: 02/09/2008 Document Revised: 05/09/2016 Document Reviewed: 05/09/2016 Elsevier Interactive Patient Education  2017 [employer]  

## 2018-07-25 ENCOUNTER — Ambulatory Visit: Payer: Medicare Other | Admitting: Physician Assistant

## 2018-07-25 ENCOUNTER — Encounter: Payer: Self-pay | Admitting: Physician Assistant

## 2018-07-25 VITALS — BP 122/72 | HR 65 | Ht 68.0 in | Wt 183.4 lb

## 2018-07-25 DIAGNOSIS — I4819 Other persistent atrial fibrillation: Secondary | ICD-10-CM | POA: Diagnosis not present

## 2018-07-25 DIAGNOSIS — I110 Hypertensive heart disease with heart failure: Secondary | ICD-10-CM | POA: Diagnosis not present

## 2018-07-25 DIAGNOSIS — I5032 Chronic diastolic (congestive) heart failure: Secondary | ICD-10-CM

## 2018-07-25 MED ORDER — TORSEMIDE 20 MG PO TABS: 20.0000 mg | ORAL_TABLET | Freq: Every day | ORAL | 3 refills | Status: DC

## 2018-07-25 NOTE — Progress Notes (Signed)
Cardiology Office Note    Date:  07/25/2018   ID:  Carrie Best, DOB Jan 17, 1928, MRN [id number]  PCP:  Kirby Funk, MD  Cardiologist:  Dr. Elease Hashimoto   Chief Complaint: CHF  follow up  History of Present Illness:   Carrie Best is a 84 y.o. female with hx of permenent atrial fibrillation, chronic diastolic CHF, dementia,  CVA, HTN , HLD, wheelchair bound due to chronic knee issue and GERD present for follow up.   Adenosine myoview was normal in 2010. Last echo 03/2012 showed LVEF of 50-55%; grade 1 DD; mild MR, mild dilated LA, RV and RA.   Noted more dyspnea when last seen by Dr. Elease Hashimoto 07/03/18. Lasix changed to torsemide. Her symptoms could be anginal equivalent. Given her advanced age, the fact that she is wheelchair-bound, and at least moderately demented, Dr. Elease Hashimoto did not felt that she is a good candidate for invasive procedures.  Hesitate to do stress testing and would hesitate to recommend invasive/interventional procedures. Started imdur 30mg  daily.   Admitted 11/3-11/5 for nausea. Incidental findings on CT abdomen and pelvis with no contrast done 07/10/2018 which revealed bladder wall thickening with calcification with previous biopsies positive for amyloidosis. Discussed with Dr. Liliane Shi of urology who will see the patient outpatient for possible cystoscopy. Recommended using torsemide only as needed for renal insufficiency.   Here today for follow up. Her dyspnea and edema has improved significantly since last OV. Currently takes torsemide 20mg  daily with Kdur . No orthopnea, PND, syncope, LE edema, chest pain or palpitations.    Past Medical History:  Diagnosis Date  . Anxiety   . Arthritis   . Congestive heart failure (HCC)    Congestive heart failure symptoms  . Depression   . Diastolic dysfunction   . Dyspnea    occasionally, with exertion  . Dysrhythmia 05-10-12   hx. A. Fib., rate is controlled-tx. Xarelto  . GERD (gastroesophageal reflux disease)  05-10-12   tx. with meds as needed  . Hearing loss 05-10-12   bilateral hearing aids.  . Heart murmur   . History of kidney stones   . Hyperlipidemia   . Hypertension   . Pneumonia 05-10-12   dx. in July- tx. antibiotics  . Stroke (HCC) 05-10-12   Lt.CVA note on scans-hx. freq. falls/ none past 6 months 01/21/14  . Wears hearing aid    bilateral    Past Surgical History:  Procedure Laterality Date  . APPENDECTOMY    . CATARACT EXTRACTION, BILATERAL  05-10-12   bilateral  . CYSTOSCOPY WITH RETROGRADE PYELOGRAM, URETEROSCOPY AND STENT PLACEMENT Right 01/31/2014   Procedure: CYSTOSCOPY WITH bilateral  RETROGRADE PYELOGRAM, right URETEROSCOPY AND right  STENT PLACEMENT, bladder biopsy with fulgeration;  Surgeon: Sebastian Ache, MD;  Location: [facility];  Service: Urology;  Laterality: Right;  . HARDWARE REMOVAL Right 09/09/2014   Procedure: HARDWARE REMOVAL RIGHT SHOULDER;  Surgeon: Mable Paris, MD;  Location: [facility];  Service: Orthopedics;  Laterality: Right;  Right shoulder hardware removal  . HOLMIUM LASER APPLICATION Right 01/31/2014   Procedure: HOLMIUM LASER APPLICATION;  Surgeon: Sebastian Ache, MD;  Location: [facility];  Service: Urology;  Laterality: Right;  . HUMERUS IM NAIL Right 06/10/2014   Procedure: INTRAMEDULLARY (IM) NAIL HUMERAL;  Surgeon: Mable Paris, MD;  Location: [facility];  Service: Orthopedics;  Laterality: Right;  Right intramedullary humeral nail  . INTERSTIM IMPLANT PLACEMENT  05-10-12   now malfunctioning  . INTERSTIM IMPLANT REMOVAL  05/16/2012  Procedure: REMOVAL OF INTERSTIM IMPLANT;  Surgeon: Martina Sinner, MD;  Location: [facility];  Service: Urology;  Laterality: N/A;  . JOINT REPLACEMENT    . OTHER SURGICAL HISTORY     Bladder Surgery  . SHOULDER SURGERY    . TOTAL KNEE ARTHROPLASTY Right 02/17/2015   Procedure: TOTAL KNEE ARTHROPLASTY;  Surgeon: Gean Birchwood, MD;  Location: [facility];  Service: Orthopedics;  Laterality: Right;     Current Medications: Prior to Admission medications   Medication Sig Start Date End Date Taking? Authorizing Provider  diltiazem (CARTIA XT) 180 MG 24 hr capsule Take 180 mg by mouth daily.    [provider]  DULoxetine (CYMBALTA) 60 MG capsule Take 60 mg by mouth daily.     [provider]  isosorbide mononitrate (IMDUR) 30 MG 24 hr tablet Take 1 tablet (30 mg total) by mouth daily. 07/03/18 06/28/19  Nahser, Deloris Ping, MD  LORazepam (ATIVAN) 0.5 MG tablet Take 0.5 mg by mouth daily as needed for anxiety (tremors).     [provider]  metoprolol tartrate (LOPRESSOR) 25 MG tablet Take 50 mg by mouth 2 (two) times daily.  03/23/12   Kirby Funk, MD  potassium chloride SA (K-DUR,KLOR-CON) 20 MEQ tablet Take 1 tablet (20 mEq total) by mouth daily. 01/28/15 08/08/18  Zannie Cove, MD  torsemide (DEMADEX) 20 MG tablet Take 1 tablet (20 mg total) by mouth daily as needed. 07/11/18 10/09/18  Darlin Drop, DO  zolpidem (AMBIEN) 10 MG tablet Take 10 mg by mouth at bedtime. For sleep    [provider]    Allergies:   Codeine; Penicillins; and Sulfur   Social History   Socioeconomic History  . Marital status: Widowed    Spouse name: Not on file  . Number of children: Not on file  . Years of education: Not on file  . Highest education level: Not on file  Occupational History  . Not on file  Social Needs  . Financial resource strain: Not on file  . Food insecurity:    Worry: Not on file    Inability: Not on file  . Transportation needs:    Medical: Not on file    Non-medical: Not on file  Tobacco Use  . Smoking status: Never Smoker  . Smokeless tobacco: Never Used  Substance and Sexual Activity  . Alcohol use: No  . Drug use: No  . Sexual activity: Never  Lifestyle  . Physical activity:    Days per week: Not on file    Minutes per session: Not on file  . Stress: Not on file  Relationships  . Social connections:    Talks on phone: Not on  file    Gets together: Not on file    Attends religious service: Not on file    Active member of club or organization: Not on file    Attends meetings of clubs or organizations: Not on file    Relationship status: Not on file  Other Topics Concern  . Not on file  Social History Narrative  . Not on file     Family History:  The patient's family history includes Stroke in her brother and mother.   ROS:   Please see the history of present illness.    ROS All other systems reviewed and are negative.   PHYSICAL EXAM:   VS:  BP 122/72   Pulse 65   Ht 5\' 8"  (1.727 m)   Wt 183 lb 6.4 oz (  83.2 kg)   SpO2 96%   BMI 27.89 kg/m    GEN: Well nourished, well developed, in no acute distress  HEENT: normal  Neck: no JVD, carotid bruits, or masses Cardiac: Ir IR ; no murmurs, rubs, or gallops, Trace BL LE edema  Respiratory:  clear to auscultation bilaterally, normal work of breathing GI: soft, nontender, nondistended, + BS MS: no deformity or atrophy  Skin: warm and dry, no rash Neuro:  Alert and Oriented x 3, Strength and sensation are intact Psych: euthymic mood, full affect  Wt Readings from Last 3 Encounters:  07/25/18 183 lb 6.4 oz (83.2 kg)  07/11/18 189 lb 9.5 oz (86 kg)  07/03/18 182 lb (82.6 kg)      Studies/Labs Reviewed:   EKG:  EKG is not ordered today.  Recent Labs: 12/24/2017: B Natriuretic Peptide 365.3 07/09/2018: Magnesium 1.9 07/11/2018: ALT 11; BUN 22; Creatinine, Ser 1.53; Hemoglobin 11.3; Platelets 193; Potassium 4.2; Sodium 137   Lipid Panel No results found for: CHOL, TRIG, HDL, CHOLHDL, VLDL, LDLCALC, LDLDIRECT  Additional studies/ records that were reviewed today include:   Echocardiogram: 07/11/18 ------------------------------------------------------------------- Study Conclusions  - Left ventricle: The cavity size was normal. Wall thickness was   normal. Systolic function was normal. The estimated ejection   fraction was in the range of 55% to  60%. Wall motion was normal;   there were no regional wall motion abnormalities. Doppler   parameters are consistent with high ventricular filling pressure. - Mitral valve: Calcified annulus. There was mild regurgitation. - Left atrium: The atrium was moderately dilated. - Right atrium: The atrium was moderately dilated. - Pulmonary arteries: Systolic pressure was mildly increased. PA   peak pressure: 40 mm Hg (S). - Pericardium, extracardiac: A trivial pericardial effusion was   identified.  Impressions:  - Normal LV systolic function; elevated LV filling pressure; mild   MR; moderate biatrial enlargement; mild TR; mild pulmonary   hypertension.    ASSESSMENT & PLAN:    1. Chronic diastolic CHF - Symptoms improved. Continue torsemide 20md daily and Kdur daily. Check BMET.   2. Persistent atrial fibrillation - Rate controlled on BB. CHADSVASC score of 7. She been off Xarealto for > 1 years. Unable to find documentation why taken off. Daughter (presents during OV) days patient has hx of dementia with multiple falls in fast and Dr. Valentina Lucks might have discontinued in past.   3. HTN - BP is stable on current medications     Medication Adjustments/Labs and Tests Ordered: Current medicines are reviewed at length with the patient today.  Concerns regarding medicines are outlined above.  Medication changes, Labs and Tests ordered today are listed in the Patient Instructions below. Patient Instructions  Medication Instructions:  1. CONTINUE ON THE TORSEMIDE 20 MG EVERY DAY  If you need a refill on your cardiac medications before your next appointment, please call your pharmacy.   Lab work: TODAY BMET If you have labs (blood work) drawn today and your tests are completely normal, you will receive your results only by: Marland Kitchen MyChart Message (if you have MyChart) OR . A paper copy in the mail If you have any lab test that is abnormal or we need to change your treatment, we will  call you to review the results.  Testing/Procedures: NONE ORDERED TODAY  Follow-Up: At [facility], you and your health needs are our priority.  As part of our continuing mission to provide you with exceptional heart care, we have created designated  Provider Care Teams.  These Care Teams include your primary Cardiologist (physician) and Advanced Practice Providers (APPs -  Physician Assistants and Nurse Practitioners) who all work together to provide you with the care you need, when you need it. You will need a follow up appointment in:  3 months.  You may see Kristeen MissPhilip Nahser, MD ON /18/20 @ 2:20 PM or one of the following Advanced Practice Providers on your designated Care Team: Tereso NewcomerScott Weaver, PA-C Vin Wol[place] City[place]hagat, [place] . Berton BonJanine Hammond, NP  Any Other Special Instructions Will Be Listed Below (If Applicable).       Lorelei PontSigned, Kandie Keiper, [place]  07/25/2018 2:50 PM    [facility] [address], [place], [place]  [address] Phone: [phone]; Fax: [phone]

## 2018-07-25 NOTE — Patient Instructions (Addendum)
Medication Instructions:  1. CONTINUE ON THE TORSEMIDE 20 MG EVERY DAY  If you need a refill on your cardiac medications before your next appointment, please call your pharmacy.   Lab work: TODAY BMET If you have labs (blood work) drawn today and your tests are completely normal, you will receive your results only by: Marland Kitchen. MyChart Message (if you have MyChart) OR . A paper copy in the mail If you have any lab test that is abnormal or we need to change your treatment, we will call you to review the results.  Testing/Procedures: NONE ORDERED TODAY  Follow-Up: At [facility], you and your health needs are our priority.  As part of our continuing mission to provide you with exceptional heart care, we have created designated Provider Care Teams.  These Care Teams include your primary Cardiologist (physician) and Advanced Practice Providers (APPs -  Physician Assistants and Nurse Practitioners) who all work together to provide you with the care you need, when you need it. You will need a follow up appointment in:  3 months.  You may see Kristeen MissPhilip Nahser, MD ON /18/20 @ 2:20 PM or one of the following Advanced Practice Providers on your designated Care Team: Tereso NewcomerScott Weaver, PA-C Vin [place]hagat, [place] . Berton BonJanine Hammond, NP  Any Other Special Instructions Will Be Listed Below (If Applicable).

## 2018-07-26 ENCOUNTER — Telehealth: Payer: Self-pay | Admitting: Cardiovascular Disease

## 2018-07-26 ENCOUNTER — Telehealth: Payer: Self-pay

## 2018-07-26 LAB — BASIC METABOLIC PANEL
BUN/Creatinine Ratio: 15 (ref 12–28)
BUN: 19 mg/dL (ref 8–27)
CALCIUM: 9.9 mg/dL (ref 8.7–10.3)
CO2: 27 mmol/L (ref 20–29)
Chloride: 96 mmol/L (ref 96–106)
Creatinine, Ser: 1.27 mg/dL — ABNORMAL HIGH (ref 0.57–1.00)
GFR, EST AFRICAN AMERICAN: 44 mL/min/{1.73_m2} — AB (ref >59)
GFR, EST NON AFRICAN AMERICAN: 38 mL/min/{1.73_m2} — AB (ref >59)
Glucose: 95 mg/dL (ref 65–99)
POTASSIUM: 3.7 mmol/L (ref 3.5–5.2)
Sodium: 137 mmol/L (ref 134–144)

## 2018-07-26 NOTE — Telephone Encounter (Signed)
Notes recorded by Sigurd Sosapp, Isay Perleberg, RN on 07/26/2018 at 1:25 PM EST The patient has been notified of the result and verbalized understanding. All questions (if any) were answered. Sigurd SosMichael Matilda Fleig, RN 07/26/2018 1:25 PM   ------

## 2018-07-26 NOTE — Telephone Encounter (Signed)
Reviewed lab results with patient's daughter, [name], and advised her that patient should continue current medications. She thanked me for the call.

## 2018-07-26 NOTE — Telephone Encounter (Signed)
-----   Message from [place] Bhag[place], [place] sent at 07/26/2018  8:10 AM EST ----- Kidney function improved. Electrolytes stable. No change.

## 2018-07-26 NOTE — Telephone Encounter (Signed)
Susie, patient's daughter is returning call for lab results.  She will be home until 1:30pm today, then going to dentist, will be back home later this afternoon.

## 2018-09-09 ENCOUNTER — Encounter (HOSPITAL_COMMUNITY): Payer: Self-pay | Admitting: Emergency Medicine

## 2018-09-09 ENCOUNTER — Emergency Department (HOSPITAL_COMMUNITY): Payer: Medicare Other

## 2018-09-09 ENCOUNTER — Observation Stay (HOSPITAL_COMMUNITY)
Admission: EM | Admit: 2018-09-09 | Discharge: 2018-09-11 | Disposition: A | Discharge status: Home / Self Care | Payer: Medicare Other | Attending: Internal Medicine | Admitting: Internal Medicine

## 2018-09-09 DIAGNOSIS — J9621 Acute and chronic respiratory failure with hypoxia: Secondary | ICD-10-CM | POA: Diagnosis not present

## 2018-09-09 DIAGNOSIS — F0390 Unspecified dementia without behavioral disturbance: Secondary | ICD-10-CM | POA: Diagnosis not present

## 2018-09-09 DIAGNOSIS — Z79899 Other long term (current) drug therapy: Secondary | ICD-10-CM | POA: Diagnosis not present

## 2018-09-09 DIAGNOSIS — I5033 Acute on chronic diastolic (congestive) heart failure: Secondary | ICD-10-CM | POA: Diagnosis not present

## 2018-09-09 DIAGNOSIS — M6281 Muscle weakness (generalized): Secondary | ICD-10-CM | POA: Diagnosis not present

## 2018-09-09 DIAGNOSIS — Z8673 Personal history of transient ischemic attack (TIA), and cerebral infarction without residual deficits: Secondary | ICD-10-CM | POA: Insufficient documentation

## 2018-09-09 DIAGNOSIS — K219 Gastro-esophageal reflux disease without esophagitis: Secondary | ICD-10-CM | POA: Diagnosis not present

## 2018-09-09 DIAGNOSIS — E785 Hyperlipidemia, unspecified: Secondary | ICD-10-CM | POA: Diagnosis not present

## 2018-09-09 DIAGNOSIS — I10 Essential (primary) hypertension: Secondary | ICD-10-CM | POA: Diagnosis not present

## 2018-09-09 DIAGNOSIS — I130 Hypertensive heart and chronic kidney disease with heart failure and stage 1 through stage 4 chronic kidney disease, or unspecified chronic kidney disease: Secondary | ICD-10-CM | POA: Diagnosis not present

## 2018-09-09 DIAGNOSIS — I480 Paroxysmal atrial fibrillation: Secondary | ICD-10-CM

## 2018-09-09 DIAGNOSIS — I4891 Unspecified atrial fibrillation: Secondary | ICD-10-CM | POA: Diagnosis not present

## 2018-09-09 DIAGNOSIS — I509 Heart failure, unspecified: Secondary | ICD-10-CM | POA: Insufficient documentation

## 2018-09-09 DIAGNOSIS — N33 Bladder disorders in diseases classified elsewhere: Secondary | ICD-10-CM

## 2018-09-09 DIAGNOSIS — N182 Chronic kidney disease, stage 2 (mild): Secondary | ICD-10-CM | POA: Insufficient documentation

## 2018-09-09 DIAGNOSIS — Z791 Long term (current) use of non-steroidal anti-inflammatories (NSAID): Secondary | ICD-10-CM | POA: Insufficient documentation

## 2018-09-09 DIAGNOSIS — Z993 Dependence on wheelchair: Secondary | ICD-10-CM | POA: Insufficient documentation

## 2018-09-09 DIAGNOSIS — E876 Hypokalemia: Secondary | ICD-10-CM | POA: Insufficient documentation

## 2018-09-09 DIAGNOSIS — E859 Amyloidosis, unspecified: Secondary | ICD-10-CM | POA: Diagnosis not present

## 2018-09-09 DIAGNOSIS — I482 Chronic atrial fibrillation, unspecified: Secondary | ICD-10-CM | POA: Diagnosis present

## 2018-09-09 DIAGNOSIS — J9601 Acute respiratory failure with hypoxia: Secondary | ICD-10-CM | POA: Diagnosis present

## 2018-09-09 DIAGNOSIS — R079 Chest pain, unspecified: Secondary | ICD-10-CM | POA: Diagnosis present

## 2018-09-09 DIAGNOSIS — R0602 Shortness of breath: Secondary | ICD-10-CM | POA: Diagnosis present

## 2018-09-09 DIAGNOSIS — E854 Organ-limited amyloidosis: Secondary | ICD-10-CM | POA: Diagnosis present

## 2018-09-09 DIAGNOSIS — I5023 Acute on chronic systolic (congestive) heart failure: Secondary | ICD-10-CM

## 2018-09-09 LAB — CBC WITH DIFFERENTIAL/PLATELET
ABS IMMATURE GRANULOCYTES: 0.04 10*3/uL (ref 0.00–0.07)
BASOS ABS: 0 10*3/uL (ref 0.0–0.1)
BASOS PCT: 0 %
Eosinophils Absolute: 0.3 10*3/uL (ref 0.0–0.5)
Eosinophils Relative: 3 %
HCT: 38.8 % (ref 36.0–46.0)
Hemoglobin: 12.6 g/dL (ref 12.0–15.0)
Immature Granulocytes: 0 %
LYMPHS ABS: 1.8 10*3/uL (ref 0.7–4.0)
Lymphocytes Relative: 18 %
MCH: 30.1 pg (ref 26.0–34.0)
MCHC: 32.5 g/dL (ref 30.0–36.0)
MCV: 92.8 fL (ref 80.0–100.0)
Monocytes Absolute: 0.7 10*3/uL (ref 0.1–1.0)
Monocytes Relative: 7 %
Neutro Abs: 7.3 10*3/uL (ref 1.7–7.7)
Neutrophils Relative %: 72 %
PLATELETS: 205 10*3/uL (ref 150–400)
RBC: 4.18 MIL/uL (ref 3.87–5.11)
RDW: 14.8 % (ref 11.5–15.5)
WBC: 10.1 10*3/uL (ref 4.0–10.5)
nRBC: 0 % (ref 0.0–0.2)

## 2018-09-09 LAB — BASIC METABOLIC PANEL
ANION GAP: 11 (ref 5–15)
BUN: 20 mg/dL (ref 8–23)
CO2: 29 mmol/L (ref 22–32)
Calcium: 9.7 mg/dL (ref 8.9–10.3)
Chloride: 98 mmol/L (ref 98–111)
Creatinine, Ser: 1.3 mg/dL — ABNORMAL HIGH (ref 0.44–1.00)
GFR, EST AFRICAN AMERICAN: 42 mL/min — AB (ref >60)
GFR, EST NON AFRICAN AMERICAN: 37 mL/min — AB (ref >60)
Glucose, Bld: 152 mg/dL — ABNORMAL HIGH (ref 70–99)
POTASSIUM: 2.8 mmol/L — AB (ref 3.5–5.1)
SODIUM: 138 mmol/L (ref 135–145)

## 2018-09-09 LAB — BRAIN NATRIURETIC PEPTIDE: B NATRIURETIC PEPTIDE 5: 309.6 pg/mL — AB (ref 0.0–100.0)

## 2018-09-09 LAB — MAGNESIUM: MAGNESIUM: 2.1 mg/dL (ref 1.7–2.4)

## 2018-09-09 LAB — I-STAT TROPONIN, ED: Troponin i, poc: 0.02 ng/mL (ref 0.00–0.08)

## 2018-09-09 LAB — TROPONIN I: Troponin I: <0.03 ng/mL (ref <0.03)

## 2018-09-09 MED ORDER — ONDANSETRON HCL 4 MG PO TABS: 4.0000 mg | ORAL_TABLET | Freq: Four times a day (QID) | ORAL | Status: DC | PRN

## 2018-09-09 MED ORDER — FUROSEMIDE 10 MG/ML IJ SOLN
60.0000 mg | Freq: Once | INTRAMUSCULAR | Status: AC
  Administered 2018-09-09: 60 mg via INTRAVENOUS
  Filled 2018-09-09: qty 6

## 2018-09-09 MED ORDER — ATORVASTATIN CALCIUM 10 MG PO TABS
20.0000 mg | ORAL_TABLET | Freq: Every day | ORAL | Status: DC
  Administered 2018-09-09 – 2018-09-11 (×3): 20 mg via ORAL
  Filled 2018-09-09 (×3): qty 2

## 2018-09-09 MED ORDER — ENOXAPARIN SODIUM 40 MG/0.4ML ~~LOC~~ SOLN
40.0000 mg | SUBCUTANEOUS | Status: DC
  Administered 2018-09-10 – 2018-09-11 (×2): 40 mg via SUBCUTANEOUS
  Filled 2018-09-09 (×2): qty 0.4

## 2018-09-09 MED ORDER — ACETAMINOPHEN 325 MG PO TABS: 650.0000 mg | ORAL_TABLET | Freq: Four times a day (QID) | ORAL | Status: DC | PRN

## 2018-09-09 MED ORDER — POTASSIUM CHLORIDE CRYS ER 20 MEQ PO TBCR
60.0000 meq | EXTENDED_RELEASE_TABLET | Freq: Once | ORAL | Status: AC
  Administered 2018-09-09: 60 meq via ORAL
  Filled 2018-09-09: qty 3

## 2018-09-09 MED ORDER — FUROSEMIDE 10 MG/ML IJ SOLN
40.0000 mg | Freq: Two times a day (BID) | INTRAMUSCULAR | Status: DC
  Administered 2018-09-10 – 2018-09-11 (×3): 40 mg via INTRAVENOUS
  Filled 2018-09-09 (×3): qty 4

## 2018-09-09 MED ORDER — ISOSORBIDE MONONITRATE ER 30 MG PO TB24
30.0000 mg | ORAL_TABLET | Freq: Every day | ORAL | Status: DC
  Administered 2018-09-09 – 2018-09-11 (×3): 30 mg via ORAL
  Filled 2018-09-09 (×3): qty 1

## 2018-09-09 MED ORDER — SODIUM CHLORIDE 0.9% FLUSH: 3.0000 mL | INTRAVENOUS | Status: DC | PRN

## 2018-09-09 MED ORDER — POTASSIUM CHLORIDE 10 MEQ/100ML IV SOLN
10.0000 meq | Freq: Once | INTRAVENOUS | Status: AC
  Administered 2018-09-09: 10 meq via INTRAVENOUS
  Filled 2018-09-09: qty 100

## 2018-09-09 MED ORDER — ONDANSETRON HCL 4 MG/2ML IJ SOLN: 4.0000 mg | Freq: Four times a day (QID) | INTRAMUSCULAR | Status: DC | PRN

## 2018-09-09 MED ORDER — POTASSIUM CHLORIDE CRYS ER 20 MEQ PO TBCR
20.0000 meq | EXTENDED_RELEASE_TABLET | Freq: Every day | ORAL | Status: DC
  Administered 2018-09-10: 20 meq via ORAL

## 2018-09-09 MED ORDER — METOPROLOL TARTRATE 50 MG PO TABS
50.0000 mg | ORAL_TABLET | Freq: Two times a day (BID) | ORAL | Status: DC
  Administered 2018-09-10 – 2018-09-11 (×4): 50 mg via ORAL
  Filled 2018-09-09: qty 1
  Filled 2018-09-09: qty 2
  Filled 2018-09-09: qty 1
  Filled 2018-09-09: qty 2

## 2018-09-09 MED ORDER — ACETAMINOPHEN 650 MG RE SUPP: 650.0000 mg | Freq: Four times a day (QID) | RECTAL | Status: DC | PRN

## 2018-09-09 MED ORDER — DULOXETINE HCL 60 MG PO CPEP
60.0000 mg | ORAL_CAPSULE | Freq: Every day | ORAL | Status: DC
  Administered 2018-09-10 – 2018-09-11 (×2): 60 mg via ORAL
  Filled 2018-09-09 (×3): qty 1

## 2018-09-09 MED ORDER — ENOXAPARIN SODIUM 30 MG/0.3ML ~~LOC~~ SOLN: 30.0000 mg | SUBCUTANEOUS | Status: DC

## 2018-09-09 MED ORDER — SODIUM CHLORIDE 0.9 % IV SOLN: 250.0000 mL | INTRAVENOUS | Status: DC | PRN

## 2018-09-09 MED ORDER — ZOLPIDEM TARTRATE 5 MG PO TABS
5.0000 mg | ORAL_TABLET | Freq: Every evening | ORAL | Status: DC | PRN
  Administered 2018-09-09: 5 mg via ORAL
  Filled 2018-09-09: qty 1

## 2018-09-09 MED ORDER — DILTIAZEM HCL ER COATED BEADS 180 MG PO CP24
180.0000 mg | ORAL_CAPSULE | Freq: Every day | ORAL | Status: DC
  Administered 2018-09-10 – 2018-09-11 (×2): 180 mg via ORAL
  Filled 2018-09-09 (×2): qty 1

## 2018-09-09 MED ORDER — SODIUM CHLORIDE 0.9% FLUSH
3.0000 mL | Freq: Two times a day (BID) | INTRAVENOUS | Status: DC
  Administered 2018-09-10 – 2018-09-11 (×3): 3 mL via INTRAVENOUS

## 2018-09-09 NOTE — ED Provider Notes (Signed)
Patient's care signed out to reassess after Lasix and potassium.  On reassessment patient nontoxic-appearing on 1 L nasal cannula.  With age, mild persistent symptoms especially with lying flat and low potassium plan for observation.  Discussed with family and hospitalist.  IV potassium ordered.  Kenton Kingfisher, MD 09/09/18 [phone]

## 2018-09-09 NOTE — H&P (Addendum)
Carrie Best HWE:[id number] DOB: [birth year] DOA: 09/09/2018     PCP: Kirby Funk, MD   Outpatient Specialists:     Urology Dr. Liliane Shi  Patient arrived to ER on 09/09/18 at 1201  Patient coming from: home Lives   With family    Chief Complaint:  Chief Complaint  Patient presents with  . Shortness of Breath    HPI: Carrie Best is a 85 y.o. female with medical history significant of A.fib, CHF diastolic, dementia,  CVA, HTN , HLD, wheelchair bound due to chronic knee issue and GERD, bladder amyloidosis, CKD BL Cr 1.5    Presented with   shortness of breath for at least 24 hours worse with exertion EMS was called was noted to sat 91% on room air normally not on oxygen reports eating some salty food recently.  Patient at baseline on torsemide 20 mg a day.  Patient felt better once EMS put oxygen on.  But still continues to be somewhat short of breath with any exertion.  Describes mild chest pain associated with this and just few seconds at a time.  Reports she has been taking her medications as prescribed. As of breath worse with lying flat improves when she sits upright.  She feels like prior episodes of CHF.  Reports bilateral lower extremity edema.  She was not taking potasium at home bc she was not aware that is was on her list  Regarding pertinent Chronic problems:  Guarding CHF have been followed by cardiology in October Lasix was changed to torsemide secondary to ongoing shortness of breath thought to be could be possibly anginal equivalent but given advanced age and wheelchair-bound status did not feel she is a good candidate for invasive procedure.  Adenosine myoview was normal in 2010.  echo 03/2012 showed LVEF of 50-55%; grade 1 DD; mild MR, mild dilated LA, RV and RA  Echo: 07/11/18 EF 55% to 60% PA peak pressure: 40 mm Hg (S). elevated LV filling pressure; mild MR; moderate biatrial enlargement; mild TR; mild pulmonary hypertension  November  incidentally found to have bladder amyloidosis Dr. Liliane Shi with urology seen as an outpatient to use torsemide only as needed  History of atrial fibrillation CHADSVASC score of 7 was on Xarelto taken off secondary to history of falls on  diltiazem and metoprolol   While in ER: Given a dose of Lasix 60 mg IV and potassium 60 mEq p.o. patient improved somewhat but continued to be short of breath with oxygen requirement  The following Work up has been ordered so far:  Orders Placed This Encounter  Procedures  . DG Chest 2 View  . CBC with Differential/Platelet  . Basic metabolic panel  . Magnesium  . Brain natriuretic peptide  . If O2 Sat <94% administer O2 at 2 liters/minute via nasal cannula  . Check Pulse Oximetry while ambulating  . Consult to hospitalist  . Pulse oximetry, continuous  . I-stat troponin, ED  . EKG 12-Lead  . ED EKG    Following Medications were ordered in ER: Medications  potassium chloride 10 mEq in 100 mL IVPB (has no administration in time range)  furosemide (LASIX) injection 60 mg (60 mg Intravenous Given 09/09/18 1436)  potassium chloride SA (K-DUR,KLOR-CON) CR tablet 60 mEq (60 mEq Oral Given 09/09/18 1616)    Significant initial  Findings: Abnormal Labs Reviewed  BASIC METABOLIC PANEL - Abnormal; Notable for the following components:      Result Value   Potassium 2.8 (*)  Glucose, Bld 152 (*)    Creatinine, Ser 1.30 (*)    GFR calc non Af Amer 37 (*)    GFR calc Af Amer 42 (*)    All other components within normal limits    Lactic Acid, Venous    Component Value Date/Time   LATICACIDVEN 1.31 07/09/2018 2249   Trop 0.02  Na 138 K 2.8  Cr   Stable,  Lab Results  Component Value Date   CREATININE 1.30 (H) 09/09/2018   CREATININE 1.27 (H) 07/25/2018   CREATININE 1.53 (H) 07/11/2018      WBC 10.1  HG/HCT  stable,       Component Value Date/Time   HGB 12.6 09/09/2018 1216   HCT 38.8 09/09/2018 1216    Troponin (Point of Care  Test) Recent Labs    09/09/18 1450  TROPIPOC 0.02    mg 2.1  BNP (last 3 results) Recent Labs    12/24/17 1819  BNP 365.3*    ProBNP (last 3 results) No results for input(s): PROBNP in the last 8760 hours.     UA  not ordered     CXR - CHF    ECG:  Personally reviewed by me showing: HR : 68 Rhythm: LBBB  no evidence of ischemic changes QTC 439      ED Triage Vitals  Enc Vitals Group     BP 09/09/18 1206 (!) 161/76     Pulse Rate 09/09/18 1206 71     Resp 09/09/18 1206 11     Temp 09/09/18 1206 98.2 F (36.8 C)     Temp Source 09/09/18 1206 Oral     SpO2 09/09/18 1205 91 %     Weight 09/09/18 1209 182 lb (82.6 kg)     Height 09/09/18 1209 5\' 7"  (1.702 m)     Head Circumference --      Peak Flow --      Pain Score 09/09/18 1210 0     Pain Loc --      Pain Edu? --      Excl. in GC? --   TMAX(24)@       Latest  Blood pressure (!) 168/78, pulse 70, temperature 98.2 F (36.8 C), temperature source Oral, resp. rate 16, height 5\' 7"  (1.702 m), weight 82.6 kg, SpO2 97 %.   Hospitalist was called for admission for CHF exacerbation    Review of Systems:    Pertinent positives include: fatigue, chest pain, Orthopnea,  Bilateral lower extremity swelling shortness of breath at rest.  dyspnea on exertion,    Constitutional:  No weight loss, night sweats, Fevers, chills,  weight loss  HEENT:  No headaches, Difficulty swallowing,Tooth/dental problems,Sore throat,  No sneezing, itching, ear ache, nasal congestion, post nasal drip,  Cardio-vascular:  No PND, anasarca, dizziness, palpitations.no  GI:  No heartburn, indigestion, abdominal pain, nausea, vomiting, diarrhea, change in bowel habits, loss of appetite, melena, blood in stool, hematemesis Resp:  no No excess mucus, no productive cough, No non-productive cough, No coughing up of blood.No change in color of mucus.No wheezing. Skin:  no rash or lesions. No jaundice GU:  no dysuria, change in color  of urine, no urgency or frequency. No straining to urinate.  No flank pain.  Musculoskeletal:  No joint pain or no joint swelling. No decreased range of motion. No back pain.  Psych:  No change in mood or affect. No depression or anxiety. No memory loss.  Neuro: no localizing neurological complaints, no tingling,  no weakness, no double vision, no gait abnormality, no slurred speech, no confusion  All systems reviewed and apart from HOPI all are negative  Past Medical History:   Past Medical History:  Diagnosis Date  . Anxiety   . Arthritis   . Congestive heart failure (HCC)    Congestive heart failure symptoms  . Depression   . Diastolic dysfunction   . Dyspnea    occasionally, with exertion  . Dysrhythmia 05-10-12   hx. A. Fib., rate is controlled-tx. Xarelto  . GERD (gastroesophageal reflux disease) 05-10-12   tx. with meds as needed  . Hearing loss 05-10-12   bilateral hearing aids.  . Heart murmur   . History of kidney stones   . Hyperlipidemia   . Hypertension   . Pneumonia 05-10-12   dx. in July- tx. antibiotics  . Stroke (HCC) 05-10-12   Lt.CVA note on scans-hx. freq. falls/ none past 6 months 01/21/14  . Wears hearing aid    bilateral      Past Surgical History:  Procedure Laterality Date  . APPENDECTOMY    . CATARACT EXTRACTION, BILATERAL  05-10-12   bilateral  . CYSTOSCOPY WITH RETROGRADE PYELOGRAM, URETEROSCOPY AND STENT PLACEMENT Right 01/31/2014   Procedure: CYSTOSCOPY WITH bilateral  RETROGRADE PYELOGRAM, right URETEROSCOPY AND right  STENT PLACEMENT, bladder biopsy with fulgeration;  Surgeon: Sebastian Ache, MD;  Location: [facility];  Service: Urology;  Laterality: Right;  . HARDWARE REMOVAL Right 09/09/2014   Procedure: HARDWARE REMOVAL RIGHT SHOULDER;  Surgeon: Mable Paris, MD;  Location: [facility];  Service: Orthopedics;  Laterality: Right;  Right shoulder hardware removal  . HOLMIUM LASER APPLICATION Right 01/31/2014   Procedure: HOLMIUM  LASER APPLICATION;  Surgeon: Sebastian Ache, MD;  Location: [facility];  Service: Urology;  Laterality: Right;  . HUMERUS IM NAIL Right 06/10/2014   Procedure: INTRAMEDULLARY (IM) NAIL HUMERAL;  Surgeon: Mable Paris, MD;  Location: [facility];  Service: Orthopedics;  Laterality: Right;  Right intramedullary humeral nail  . INTERSTIM IMPLANT PLACEMENT  05-10-12   now malfunctioning  . INTERSTIM IMPLANT REMOVAL  05/16/2012   Procedure: REMOVAL OF INTERSTIM IMPLANT;  Surgeon: Martina Sinner, MD;  Location: [facility];  Service: Urology;  Laterality: N/A;  . JOINT REPLACEMENT    . OTHER SURGICAL HISTORY     Bladder Surgery  . SHOULDER SURGERY    . TOTAL KNEE ARTHROPLASTY Right 02/17/2015   Procedure: TOTAL KNEE ARTHROPLASTY;  Surgeon: Gean Birchwood, MD;  Location: [facility];  Service: Orthopedics;  Laterality: Right;    Social History:  Ambulatory  wheelchair bound,      reports that she has never smoked. She has never used smokeless tobacco. She reports that she does not drink alcohol or use drugs.     Family History:   Family History  Problem Relation Age of Onset  . Stroke Mother   . Stroke Brother     Allergies: Allergies  Allergen Reactions  . Codeine Nausea And Vomiting  . Penicillins Hives    Has patient had a PCN reaction causing immediate rash, facial/tongue/throat swelling, SOB or lightheadedness with hypotension: No Has patient had a PCN reaction causing severe rash involving mucus membranes or skin necrosis: No Has patient had a PCN reaction that required hospitalization No Has patient had a PCN reaction occurring within the last 10 years: No If all of the above answers are "NO", then may proceed with Cephalosporin use.   . Sulfur Nausea And Vomiting  Prior to Admission medications   Medication Sig Start Date End Date Taking? Authorizing Provider  atorvastatin (LIPITOR) 20 MG tablet Take 20 mg by mouth daily.   Yes [provider]  diltiazem (CARTIA XT)  180 MG 24 hr capsule Take 180 mg by mouth daily.   Yes [provider]  DULoxetine (CYMBALTA) 60 MG capsule Take 60 mg by mouth daily.    Yes [provider]  isosorbide mononitrate (IMDUR) 30 MG 24 hr tablet Take 1 tablet (30 mg total) by mouth daily. 07/03/18 06/28/19 Yes Nahser, Deloris PingPhilip J, MD  meloxicam (MOBIC) 15 MG tablet Take 15 mg by mouth daily.   Yes [provider]  metoprolol tartrate (LOPRESSOR) 25 MG tablet Take 50 mg by mouth 2 (two) times daily.  03/23/12  Yes Kirby FunkGriffin, John, MD  torsemide (DEMADEX) 20 MG tablet Take 1 tablet (20 mg total) by mouth daily. Patient taking differently: Take 20 mg by mouth 2 (two) times daily.  07/25/18 10/23/18 Yes Bhagat, Bhavinkumar, PA  potassium chloride SA (K-DUR,KLOR-CON) 20 MEQ tablet Take 1 tablet (20 mEq total) by mouth daily. Patient not taking: Reported on 09/09/2018 01/28/15 08/08/18  Zannie CoveJoseph, Preetha, MD   Physical Exam: Blood pressure (!) 168/78, pulse 70, temperature 98.2 F (36.8 C), temperature source Oral, resp. rate 16, height 5\' 7"  (1.702 m), weight 82.6 kg, SpO2 97 %. 1. General:  in No Acute distress  Chronically ill -appearing 2. Psychological: Alert and   Oriented 3. Head/ENT:   Moist  Mucous Membranes                          Head Non traumatic, neck supple                           Poor Dentition 4. SKIN: normal   Skin turgor,  Skin clean Dry and intact no rash 5. Heart: Regular rate and rhythm no  Murmur, no Rub or gallop 6. Lungs: no wheezes mild  crackles   7. Abdomen: Soft,  non-tender, Non distended  obese  bowel sounds present 8. Lower extremities: no clubbing, cyanosis, or trace edema 9. Neurologically Grossly intact, moving all 4 extremities equally  10. MSK: Normal range of motion   LABS:     Recent Labs  Lab 09/09/18 1216  WBC 10.1  NEUTROABS 7.3  HGB 12.6  HCT 38.8  MCV 92.8  PLT 205   Basic Metabolic Panel: Recent Labs  Lab 09/09/18 1216  NA 138  K 2.8*  CL 98  CO2 29   GLUCOSE 152*  BUN 20  CREATININE 1.30*  CALCIUM 9.7      No results for input(s): AST, ALT, ALKPHOS, BILITOT, PROT, ALBUMIN in the last 168 hours. No results for input(s): LIPASE, AMYLASE in the last 168 hours. No results for input(s): AMMONIA in the last 168 hours.    HbA1C: No results for input(s): HGBA1C in the last 72 hours. CBG: No results for input(s): GLUCAP in the last 168 hours.    Urine analysis:    Component Value Date/Time   COLORURINE YELLOW 07/11/2018 0848   APPEARANCEUR CLEAR 07/11/2018 0848   LABSPEC 1.011 07/11/2018 0848   PHURINE 6.0 07/11/2018 0848   GLUCOSEU NEGATIVE 07/11/2018 0848   HGBUR NEGATIVE 07/11/2018 0848   BILIRUBINUR NEGATIVE 07/11/2018 0848   KETONESUR NEGATIVE 07/11/2018 0848   PROTEINUR NEGATIVE 07/11/2018 0848   UROBILINOGEN 0.2 02/07/2015 1534  NITRITE NEGATIVE 07/11/2018 0848   LEUKOCYTESUR SMALL (A) 07/11/2018 0848      Cultures:    Component Value Date/Time   SDES SPUTUM 10/06/2015 2029   SPECREQUEST NONE 10/06/2015 2029   CULT  10/06/2015 2029    NORMAL OROPHARYNGEAL FLORA Performed at [facility]    REPTSTATUS 10/09/2015 FINAL 10/06/2015 2029     Radiological Exams on Admission: Dg Chest 2 View  Result Date: 09/09/2018 CLINICAL DATA:  Shortness of breath since yesterday. EXAM: CHEST - 2 VIEW COMPARISON:  12/24/2017 FINDINGS: Heart size is normal. There is aortic atherosclerosis. There may be mild venous hypertension. Question early interstitial edema. Small effusions in the posterior costophrenic angles. No consolidation or lobar collapse. No acute bone finding. IMPRESSION: Suspicion of congestive heart failure with venous hypertension, mild interstitial edema and small effusions. Electronically Signed   By: Paulina Fusi M.D.   On: 09/09/2018 13:37    Chart has been reviewed    Assessment/Plan  85 y.o. female with medical history significant of A.fib, CHF diastolic, dementia,  CVA, HTN , HLD, wheelchair  bound due to chronic knee issue and GERD, bladder amyloidosis, CKD BL Cr 1.5 Admitted for diastolic CHF exacerbation  Present on Admission:  . Acute on chronic diastolic CHF (congestive heart failure) (HCC) -  - admit on telemetry,  cycle cardiac enzymes, Troponin 0.02   obtain serial ECG  to evaluate for ischemia as a cause of heart failure   monitor daily weight:  Filed Weights   09/09/18 1209  Weight: 82.6 kg   Last BNP BNP (last 3 results) Recent Labs    12/24/17 1819  BNP 365.3*    ProBNP (last 3 results) No results for input(s): PROBNP in the last 8760 hours.   diurese with IV lasix and monitor orthostatics and creatinine to avoid over diuresis. Hold on echo recently done   ACE/ARBi  Contraindicated    cardiology email  . Hypokalemia - will replace and follow will need to make sure she takes it at home . HTN (hypertension) -continue home medications stable . GERD (gastroesophageal reflux disease) -stable continue home medications . Hyperlipidemia -stable continue home medications . Atrial fibrillation (HCC) -           - CHA2DS2 vas score 7 Not on anticoagulation secondary to Risk of Falls,           -  Rate control:  Currently controlled with  Metoprolol,  Diltiazem,   Insomnia continue Ambien as needed nightly CKD -creatinine currently at baseline avoid nephrotoxic medications monitor kidney status while diuresing Other plan as per orders.  DVT prophylaxis:   Lovenox     Code Status:    DNR/DNI  as per patient   I had personally discussed CODE STATUS with patient and family    Family Communication:   Family   at  Bedside  plan of care was discussed with    Daughter  Disposition Plan:   To home once workup is complete and patient is stable                    Would benefit from PT/OT eval prior to DC  Ordered                                      Consults called:    email cards if pt still here on Monday would appreciate their input  otherwise follow-up as  an outpatient  Admission status:   Obs    Level of care    tele  For  24H    Glenna Brunkow 09/09/2018, 7:57 PM    [facility]  Pager [phone]   after 2 AM please page floor coverage PA If 7AM-7PM, please contact the day team taking care of the patient  [email or web address]  Password [email or web address]

## 2018-09-09 NOTE — ED Provider Notes (Signed)
[facility] EMERGENCY DEPARTMENT Provider Note   CSN: [id number] Arrival date & time: 09/09/18  1201     History   Chief Complaint Chief Complaint  Patient presents with  . Shortness of Breath   History is obtained from patient and from patient's daughter who accompanies her HPI Carrie Best is a 85 y.o. female.  Patient complanes of shortness of breath onset last night which is worse with lying flat and improved with sitting upright.  Feels like congestive heart failure she has had in the past.  She did ate bacon and a chicken pie yesterday which was high in salt.  She is advised to be on a low-sodium diet by her healthcare providers.  Associated symptoms include mild swelling of both legs and chest pain, a few episodes lasting a split second at a time.  No other associated symptoms.  No treatment prior to coming here  HPI  Past Medical History:  Diagnosis Date  . Anxiety   . Arthritis   . Congestive heart failure (HCC)    Congestive heart failure symptoms  . Depression   . Diastolic dysfunction   . Dyspnea    occasionally, with exertion  . Dysrhythmia 05-10-12   hx. A. Fib., rate is controlled-tx. Xarelto  . GERD (gastroesophageal reflux disease) 05-10-12   tx. with meds as needed  . Hearing loss 05-10-12   bilateral hearing aids.  . Heart murmur   . History of kidney stones   . Hyperlipidemia   . Hypertension   . Pneumonia 05-10-12   dx. in July- tx. antibiotics  . Stroke (HCC) 05-10-12   Lt.CVA note on scans-hx. freq. falls/ none past 6 months 01/21/14  . Wears hearing aid    bilateral    Patient Active Problem List   Diagnosis Date Noted  . ARF (acute renal failure) (HCC) 07/10/2018  . Nausea & vomiting 07/09/2018  . Hypokalemia 12/25/2017  . Acute on chronic diastolic CHF (congestive heart failure) (HCC) 12/24/2017  . 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 Rupture of right patellar tendon 05/19/2016  . H/O nonunion of fracture  03/16/2016  . Arthritis of knee 02/17/2015  . Primary osteoarthritis of right knee Valgus 02/14/2015  . Chronic diastolic CHF (congestive heart failure) (HCC) 02/06/2015  . Proximal humerus fracture 06/10/2014  . Obesity 03/18/2012  . HTN (hypertension) 03/18/2012  . GERD (gastroesophageal reflux disease) 03/18/2012  . Hyperlipidemia 03/18/2012  . Sleep disturbance 03/18/2012  . Atrial fibrillation (HCC) 03/18/2012    Past Surgical History:  Procedure Laterality Date  . APPENDECTOMY    . CATARACT EXTRACTION, BILATERAL  05-10-12   bilateral  . CYSTOSCOPY WITH RETROGRADE PYELOGRAM, URETEROSCOPY AND STENT PLACEMENT Right 01/31/2014   Procedure: CYSTOSCOPY WITH bilateral  RETROGRADE PYELOGRAM, right URETEROSCOPY AND right  STENT PLACEMENT, bladder biopsy with fulgeration;  Surgeon: Sebastian Ache, MD;  Location: [facility];  Service: Urology;  Laterality: Right;  . HARDWARE REMOVAL Right 09/09/2014   Procedure: HARDWARE REMOVAL RIGHT SHOULDER;  Surgeon: Mable Paris, MD;  Location: [facility];  Service: Orthopedics;  Laterality: Right;  Right shoulder hardware removal  . HOLMIUM LASER APPLICATION Right 01/31/2014   Procedure: HOLMIUM LASER APPLICATION;  Surgeon: Sebastian Ache, MD;  Location: [facility];  Service: Urology;  Laterality: Right;  . HUMERUS IM NAIL Right 06/10/2014   Procedure: INTRAMEDULLARY (IM) NAIL HUMERAL;  Surgeon: Mable Paris, MD;  Location: [facility];  Service: Orthopedics;  Laterality: Right;  Right intramedullary humeral nail  .  INTERSTIM IMPLANT PLACEMENT  05-10-12   now malfunctioning  . INTERSTIM IMPLANT REMOVAL  05/16/2012   Procedure: REMOVAL OF INTERSTIM IMPLANT;  Surgeon: Martina SinnerScott A MacDiarmid, MD;  Location: [facility];  Service: Urology;  Laterality: N/A;  . JOINT REPLACEMENT    . OTHER SURGICAL HISTORY     Bladder Surgery  . SHOULDER SURGERY    . TOTAL KNEE ARTHROPLASTY Right 02/17/2015   Procedure: TOTAL KNEE ARTHROPLASTY;  Surgeon: Gean BirchwoodFrank  Rowan, MD;  Location: [facility];  Service: Orthopedics;  Laterality: Right;     OB History   No obstetric history on file.      Home Medications    Prior to Admission medications   Medication Sig Start Date End Date Taking? Authorizing Provider  atorvastatin (LIPITOR) 20 MG tablet Take 20 mg by mouth daily.   Yes [provider]  diltiazem (CARTIA XT) 180 MG 24 hr capsule Take 180 mg by mouth daily.   Yes [provider]  DULoxetine (CYMBALTA) 60 MG capsule Take 60 mg by mouth daily.    Yes [provider]  isosorbide mononitrate (IMDUR) 30 MG 24 hr tablet Take 1 tablet (30 mg total) by mouth daily. 07/03/18 06/28/19 Yes Nahser, Deloris PingPhilip J, MD  meloxicam (MOBIC) 15 MG tablet Take 15 mg by mouth daily.   Yes [provider]  metoprolol tartrate (LOPRESSOR) 25 MG tablet Take 50 mg by mouth 2 (two) times daily.  03/23/12  Yes Kirby FunkGriffin, John, MD  torsemide (DEMADEX) 20 MG tablet Take 1 tablet (20 mg total) by mouth daily. Patient taking differently: Take 20 mg by mouth 2 (two) times daily.  07/25/18 10/23/18 Yes Bhagat, Bhavinkumar, PA  potassium chloride SA (K-DUR,KLOR-CON) 20 MEQ tablet Take 1 tablet (20 mEq total) by mouth daily. Patient not taking: Reported on 09/09/2018 01/28/15 08/08/18  Zannie CoveJoseph, Preetha, MD    Family History Family History  Problem Relation Age of Onset  . Stroke Mother   . Stroke Brother     Social History Social History   Tobacco Use  . Smoking status: Never Smoker  . Smokeless tobacco: Never Used  Substance Use Topics  . Alcohol use: No  . Drug use: No     Allergies   Codeine; Penicillins; and Sulfur   Review of Systems Review of Systems  Constitutional: Negative.   HENT: Positive for hearing loss.        Chronically hard of hearing  Respiratory: Positive for shortness of breath.   Cardiovascular: Positive for chest pain and leg swelling.  Gastrointestinal: Negative.   Musculoskeletal: Negative.   Skin: Negative.    Neurological: Negative.   Psychiatric/Behavioral: Negative.   All other systems reviewed and are negative.    Physical Exam Updated Vital Signs BP (!) 177/99   Pulse 63   Temp 98.2 F (36.8 C) (Oral)   Resp 20   Ht 5\' 7"  (1.702 m)   Wt 82.6 kg   SpO2 96%   BMI 28.51 kg/m   Physical Exam Vitals signs and nursing note reviewed.  Constitutional:      Appearance: She is well-developed.  HENT:     Head: Normocephalic and atraumatic.     Nose: Nose normal.  Eyes:     Conjunctiva/sclera: Conjunctivae normal.     Pupils: Pupils are equal, round, and reactive to light.  Neck:     Musculoskeletal: Neck supple.     Thyroid: No thyromegaly.     Trachea: No tracheal deviation.     Comments: No  JVD Cardiovascular:     Rate and Rhythm: Normal rate and regular rhythm.     Heart sounds: No murmur.  Pulmonary:     Effort: Pulmonary effort is normal.     Breath sounds: Rales present.     Comments: Rales at bases bilaterally Abdominal:     General: Bowel sounds are normal. There is no distension.     Palpations: Abdomen is soft.     Tenderness: There is no abdominal tenderness.  Musculoskeletal: Normal range of motion.        General: No tenderness.     Right lower leg: Edema present.     Left lower leg: Edema present.     Comments: Trace pretibial pitting edema bilaterally  Skin:    General: Skin is warm and dry.     Findings: No rash.  Neurological:     Mental Status: She is alert.     Coordination: Coordination normal.      ED Treatments / Results  Labs (all labs ordered are listed, but only abnormal results are displayed) Labs Reviewed - No data to display  EKG EKG Interpretation  Date/Time:  Saturday September 09 2018 12:06:51 EST Ventricular Rate:  68 PR Interval:    QRS Duration: 146 QT Interval:  412 QTC Calculation: 439 R Axis:   -38 Text Interpretation:  Atrial fibrillation Ventricular premature complex Left bundle branch block No significant change  since last tracing Confirmed by Doug Sou [phone]) on 09/09/2018 12:27:59 PM  Results for orders placed or performed during the hospital encounter of 09/09/18  CBC with Differential/Platelet  Result Value Ref Range   WBC 10.1 4.0 - 10.5 K/uL   RBC 4.18 3.87 - 5.11 MIL/uL   Hemoglobin 12.6 12.0 - 15.0 g/dL   HCT 41.6 60.6 - 30.1 %   MCV 92.8 80.0 - 100.0 fL   MCH 30.1 26.0 - 34.0 pg   MCHC 32.5 30.0 - 36.0 g/dL   RDW 60.1 09.3 - 23.5 %   Platelets 205 150 - 400 K/uL   nRBC 0.0 0.0 - 0.2 %   Neutrophils Relative % 72 %   Neutro Abs 7.3 1.7 - 7.7 K/uL   Lymphocytes Relative 18 %   Lymphs Abs 1.8 0.7 - 4.0 K/uL   Monocytes Relative 7 %   Monocytes Absolute 0.7 0.1 - 1.0 K/uL   Eosinophils Relative 3 %   Eosinophils Absolute 0.3 0.0 - 0.5 K/uL   Basophils Relative 0 %   Basophils Absolute 0.0 0.0 - 0.1 K/uL   Immature Granulocytes 0 %   Abs Immature Granulocytes 0.04 0.00 - 0.07 K/uL  Basic metabolic panel  Result Value Ref Range   Sodium 138 135 - 145 mmol/L   Potassium 2.8 (L) 3.5 - 5.1 mmol/L   Chloride 98 98 - 111 mmol/L   CO2 29 22 - 32 mmol/L   Glucose, Bld 152 (H) 70 - 99 mg/dL   BUN 20 8 - 23 mg/dL   Creatinine, Ser 5.73 (H) 0.44 - 1.00 mg/dL   Calcium 9.7 8.9 - 22.0 mg/dL   GFR calc non Af Amer 37 (L) >60 mL/min   GFR calc Af Amer 42 (L) >60 mL/min   Anion gap 11 5 - 15  I-stat troponin, ED  Result Value Ref Range   Troponin i, poc 0.02 0.00 - 0.08 ng/mL   Comment 3           Dg Chest 2 View  Result Date: 09/09/2018 CLINICAL DATA:  Shortness of breath since yesterday. EXAM: CHEST - 2 VIEW COMPARISON:  12/24/2017 FINDINGS: Heart size is normal. There is aortic atherosclerosis. There may be mild venous hypertension. Question early interstitial edema. Small effusions in the posterior costophrenic angles. No consolidation or lobar collapse. No acute bone finding. IMPRESSION: Suspicion of congestive heart failure with venous hypertension, mild interstitial edema and  small effusions. Electronically Signed   By: Paulina Fusi M.D.   On: 09/09/2018 13:37   Radiology Dg Chest 2 View  Result Date: 09/09/2018 CLINICAL DATA:  Shortness of breath since yesterday. EXAM: CHEST - 2 VIEW COMPARISON:  12/24/2017 FINDINGS: Heart size is normal. There is aortic atherosclerosis. There may be mild venous hypertension. Question early interstitial edema. Small effusions in the posterior costophrenic angles. No consolidation or lobar collapse. No acute bone finding. IMPRESSION: Suspicion of congestive heart failure with venous hypertension, mild interstitial edema and small effusions. Electronically Signed   By: Paulina Fusi M.D.   On: 09/09/2018 13:37    Procedures Procedures (including critical care time)  Medications Ordered in ED Medications - No data to display   Initial Impression / Assessment and Plan / ED Course  I have reviewed the triage vital signs and the nursing notes.  Pertinent labs & imaging results that were available during my care of the patient were reviewed by me and considered in my medical decision making (see chart for details).     Chest x-ray viewed by me Lab work consistent with mild renal insufficiency which is chronic and hypokalemia.  Oral potassium supplementation ordered.  3:55 PM patient states breathing is improved after treatment with intravenous Lasix.  She is diuresed approximately 300 mL.  Signed out to [facility] 4:25 PM.  Patient likely slipped into mild congestive heart failure due to noncompliance with diet.  Serum magnesium pending. Final Clinical Impressions(s) / ED Diagnoses  Diagnosis #1 is acute on chronic congestive heart failure #2 hypokalemia #3 chronic renal insufficiency Final diagnoses:  None    ED Discharge Orders    None       Doug Sou, MD 09/09/18 1627

## 2018-09-09 NOTE — ED Triage Notes (Signed)
Pt arrives via EMS from home with reports of SOB since yesterday, worse with exertion. 91% of RA. Pt reports some relief with O2 from EMS. States she has not missed any doses of her torsemide.  Hx of CHF.

## 2018-09-10 ENCOUNTER — Encounter (HOSPITAL_COMMUNITY): Payer: Self-pay

## 2018-09-10 ENCOUNTER — Other Ambulatory Visit: Payer: Self-pay

## 2018-09-10 DIAGNOSIS — E854 Organ-limited amyloidosis: Secondary | ICD-10-CM | POA: Diagnosis not present

## 2018-09-10 DIAGNOSIS — N182 Chronic kidney disease, stage 2 (mild): Secondary | ICD-10-CM | POA: Diagnosis present

## 2018-09-10 DIAGNOSIS — J9601 Acute respiratory failure with hypoxia: Secondary | ICD-10-CM | POA: Diagnosis present

## 2018-09-10 DIAGNOSIS — J9621 Acute and chronic respiratory failure with hypoxia: Secondary | ICD-10-CM | POA: Diagnosis present

## 2018-09-10 DIAGNOSIS — I5033 Acute on chronic diastolic (congestive) heart failure: Secondary | ICD-10-CM | POA: Diagnosis not present

## 2018-09-10 DIAGNOSIS — N33 Bladder disorders in diseases classified elsewhere: Secondary | ICD-10-CM

## 2018-09-10 LAB — COMPREHENSIVE METABOLIC PANEL
ALT: 11 U/L (ref 0–44)
AST: 21 U/L (ref 15–41)
Albumin: 3.5 g/dL (ref 3.5–5.0)
Alkaline Phosphatase: 83 U/L (ref 38–126)
Anion gap: 11 (ref 5–15)
BILIRUBIN TOTAL: 1.5 mg/dL — AB (ref 0.3–1.2)
BUN: 16 mg/dL (ref 8–23)
CO2: 30 mmol/L (ref 22–32)
Calcium: 9.3 mg/dL (ref 8.9–10.3)
Chloride: 98 mmol/L (ref 98–111)
Creatinine, Ser: 1.34 mg/dL — ABNORMAL HIGH (ref 0.44–1.00)
GFR calc Af Amer: 41 mL/min — ABNORMAL LOW (ref >60)
GFR calc non Af Amer: 35 mL/min — ABNORMAL LOW (ref >60)
Glucose, Bld: 117 mg/dL — ABNORMAL HIGH (ref 70–99)
Potassium: 3 mmol/L — ABNORMAL LOW (ref 3.5–5.1)
Sodium: 139 mmol/L (ref 135–145)
Total Protein: 6.7 g/dL (ref 6.5–8.1)

## 2018-09-10 LAB — BASIC METABOLIC PANEL
Anion gap: 9 (ref 5–15)
BUN: 16 mg/dL (ref 8–23)
CHLORIDE: 99 mmol/L (ref 98–111)
CO2: 31 mmol/L (ref 22–32)
CREATININE: 1.27 mg/dL — AB (ref 0.44–1.00)
Calcium: 9 mg/dL (ref 8.9–10.3)
GFR calc Af Amer: 44 mL/min — ABNORMAL LOW (ref >60)
GFR calc non Af Amer: 38 mL/min — ABNORMAL LOW (ref >60)
Glucose, Bld: 121 mg/dL — ABNORMAL HIGH (ref 70–99)
Potassium: 3 mmol/L — ABNORMAL LOW (ref 3.5–5.1)
Sodium: 139 mmol/L (ref 135–145)

## 2018-09-10 LAB — TROPONIN I
Troponin I: <0.03 ng/mL (ref <0.03)
Troponin I: <0.03 ng/mL (ref <0.03)

## 2018-09-10 LAB — CBC
HCT: 35.2 % — ABNORMAL LOW (ref 36.0–46.0)
Hemoglobin: 11.4 g/dL — ABNORMAL LOW (ref 12.0–15.0)
MCH: 30.2 pg (ref 26.0–34.0)
MCHC: 32.4 g/dL (ref 30.0–36.0)
MCV: 93.4 fL (ref 80.0–100.0)
Platelets: 202 10*3/uL (ref 150–400)
RBC: 3.77 MIL/uL — ABNORMAL LOW (ref 3.87–5.11)
RDW: 14.7 % (ref 11.5–15.5)
WBC: 8.6 10*3/uL (ref 4.0–10.5)
nRBC: 0 % (ref 0.0–0.2)

## 2018-09-10 LAB — TSH: TSH: 1.852 u[IU]/mL (ref 0.350–4.500)

## 2018-09-10 LAB — MAGNESIUM: Magnesium: 1.7 mg/dL (ref 1.7–2.4)

## 2018-09-10 LAB — PHOSPHORUS: Phosphorus: 3.5 mg/dL (ref 2.5–4.6)

## 2018-09-10 MED ORDER — MAGNESIUM SULFATE 2 GM/50ML IV SOLN
2.0000 g | Freq: Once | INTRAVENOUS | Status: DC
  Filled 2018-09-10: qty 50

## 2018-09-10 MED ORDER — ZOLPIDEM TARTRATE 5 MG PO TABS
5.0000 mg | ORAL_TABLET | Freq: Every day | ORAL | Status: DC
  Administered 2018-09-10: 5 mg via ORAL
  Filled 2018-09-10: qty 1

## 2018-09-10 MED ORDER — INFLUENZA VAC SPLIT HIGH-DOSE 0.5 ML IM SUSY: 0.5000 mL | PREFILLED_SYRINGE | INTRAMUSCULAR | Status: DC

## 2018-09-10 MED ORDER — MAGNESIUM SULFATE 2 GM/50ML IV SOLN
2.0000 g | Freq: Once | INTRAVENOUS | Status: AC
  Administered 2018-09-10: 2 g via INTRAVENOUS
  Filled 2018-09-10: qty 50

## 2018-09-10 MED ORDER — POTASSIUM CHLORIDE CRYS ER 20 MEQ PO TBCR
40.0000 meq | EXTENDED_RELEASE_TABLET | ORAL | Status: AC
  Administered 2018-09-10 (×2): 40 meq via ORAL
  Filled 2018-09-10 (×2): qty 2

## 2018-09-10 NOTE — Progress Notes (Addendum)
[facility] PROGRESS NOTE  Carrie Best MMH:[id number] DOB: 01-31-1928 DOA: 09/09/2018 PCP: Kirby Funk, MD  Assessment/Plan:  #1. Acute respiratory failure with hypoxia secondary to acute on chronic diastolic heart failure.  Oxygen saturation level 89% on room air upon presentation. Chest x-ray Suspicion of congestive heart failure with venous hypertension, mild interstitial edema and small effusions. BNP 309.  -Chart review indicates that 3 months ago her Lasix was changed to torsemide. She received 60 mg of IV Lasix in then saturation level greater  -Continue IV Lasix -Wean oxygen as able -Monitor oxygen saturation level  #2. Acute on chronic diastolic heart  Failure. Echo 2 months ago revealed EF of 55% with elevated LV filling pressurechest x-ray as noted above. She was provided with 60 mg of IV Lasix. So far she has diuresed 1.5 L clear yellow urine -continue IV Lasix every 12 -Monitor urine output -Obtain daily weights -Continue home meds  #3. Hypokalemia. Patient stopped taking her Kdur  According to daughter-in-law-- patient thinks her cardiologist and PCP were disagreeing if she needed it -repleted today  3.0. along with Mag: level was 1.7. -We'll provide kder 40 mEq every 4 hours 2 -Recheck in the morning  #3. Hypertension. Fair control in the emergency department. Home medications include diltiazem, imdur, Lopressor -continue home meds -Monitor closely  #4.atrial fibrillation. Not on anticoagulation secondary to risk of falls. Rate controlled. chadvasc score 6. -continue home med  #5. CKD II. Creatinine 1.5. this appears to be a little above baseline.  -monitor urine output -bemet in am -hold nephrotoxins as able  #6. Bladder amyloidosis. OP f/u. Previous biopsies done   Physical exam  1. General:  in No Acute distress. Awake, sleepy, well nourished  Chronically ill -appearing 2. Psychological: Alert and   Oriented to self and place 3. Head/ENT:    Moist  Mucous Membranes                          Head Non traumatic, neck supple                           Poor Dentition 4. SKIN: normal   Skin turgor,  Skin clean Dry and intact no rash 5. Heart: irregularly irregualar. No   Murmur, no Rub or gallop no LE edema 6. Lungs: BS quite distant particularly on right fine crackle left base, no wheeze 7. Abdomen: Soft,  non-tender, Non distended  obese  bowel sounds present 8. Lower extremities: no clubbing, cyanosis, or trace edema 9. Neurologically Grossly intact, moving all 4 extremities equally  10. MSK: Normal range of motion   Data Reviewed: Basic Metabolic Panel: Recent Labs  Lab 09/09/18 1216 09/10/18 0027 09/10/18 0500  NA 138 139 139  K 2.8* 3.0* 3.0*  CL 98 99 98  CO2 29 31 30   GLUCOSE 152* 121* 117*  BUN 20 16 16   CREATININE 1.30* 1.27* 1.34*  CALCIUM 9.7 9.0 9.3  MG 2.1  --  1.7  PHOS  --   --  3.5   Liver Function Tests: Recent Labs  Lab 09/10/18 0500  AST 21  ALT 11  ALKPHOS 83  BILITOT 1.5*  PROT 6.7  ALBUMIN 3.5   No results for input(s): LIPASE, AMYLASE in the last 168 hours. No results for input(s): AMMONIA in the last 168 hours. CBC: Recent Labs  Lab 09/09/18 1216 09/10/18 0500  WBC 10.1 8.6  NEUTROABS 7.3  --  HGB 12.6 11.4*  HCT 38.8 35.2*  MCV 92.8 93.4  PLT 205 202   Cardiac Enzymes: Recent Labs  Lab 09/09/18 2016 09/10/18 0027  TROPONINI <0.03 <0.03   BNP (last 3 results) Recent Labs    12/24/17 1819 09/09/18 1216  BNP 365.3* 309.6*    ProBNP (last 3 results) No results for input(s): PROBNP in the last 8760 hours.  CBG: No results for input(s): GLUCAP in the last 168 hours.  No results found for this or any previous visit (from the past 240 hour(s)).   Studies: Dg Chest 2 View  Result Date: 09/09/2018 CLINICAL DATA:  Shortness of breath since yesterday. EXAM: CHEST - 2 VIEW COMPARISON:  12/24/2017 FINDINGS: Heart size is normal. There is aortic atherosclerosis. There  may be mild venous hypertension. Question early interstitial edema. Small effusions in the posterior costophrenic angles. No consolidation or lobar collapse. No acute bone finding. IMPRESSION: Suspicion of congestive heart failure with venous hypertension, mild interstitial edema and small effusions. Electronically Signed   By: Paulina Fusi M.D.   On: 09/09/2018 13:37    Scheduled Meds: . atorvastatin  20 mg Oral Daily  . diltiazem  180 mg Oral Daily  . DULoxetine  60 mg Oral Daily  . enoxaparin (LOVENOX) injection  40 mg Subcutaneous Q24H  . furosemide  40 mg Intravenous Q12H  . isosorbide mononitrate  30 mg Oral Daily  . metoprolol tartrate  50 mg Oral BID  . potassium chloride  40 mEq Oral Q4H  . sodium chloride flush  3 mL Intravenous Q12H   Continuous Infusions: . sodium chloride    . magnesium sulfate 1 - 4 g bolus IVPB      Principal Problem:   Acute respiratory failure with hypoxia (HCC) Active Problems:   Atrial fibrillation (HCC)   Acute on chronic diastolic CHF (congestive heart failure) (HCC)   HTN (hypertension)   Hypokalemia   Chronic kidney disease (CKD), stage II (mild)   GERD (gastroesophageal reflux disease)   Hyperlipidemia   Amyloidosis of bladder (HCC)    Time spent: 45 minutes    [facility] M NP [facility]  If 7PM-7AM, please contact night-coverage at [email or web address], password [facility] 09/10/2018, 11:50 AM  LOS: 0 days      Patient was seen, examined,treatment plan was discussed with the Advance Practice Provider.  I have personally reviewed the clinical findings, labs, EKG, imaging studies and management of this patient in detail. I have also reviewed the orders written for this patient which were under my direction. I agree with the documentation, as recorded by the Advance Practice Provider.   Carrie Best is a 85 y.o. female here with an acute exacerbation of her diastolic CHF.  Admits to eating salty foods more recently.  She lives with  her daughter (who also has dementia) per daughter in law at bedside.  She is virtually wheelchair bound.  Plan to diurese and re-evaluate in AM-- has had good output this far and may be ok to d/c in AM.  Joseph Art, DO

## 2018-09-10 NOTE — Evaluation (Signed)
Physical Therapy Evaluation Patient Details Name: Carrie Best MRN: [id number] DOB: [birth year] Today's Date: 09/10/2018   History of Present Illness  Pt is a 85 y.o. F with significant PMH of a. fib, CHF, dementia, CVA, HTN, right TKA who presents with acute respiratory failure with hypoxia secondary to acute on chronic diastolic heart failure.   Clinical Impression  Pt admitted with above diagnosis. Pt currently with functional limitations due to the deficits listed below (see PT Problem List). Prior to admission, patient lives with her sister and has been non ambulatory since previous R TKA and uses wheelchair for mobility. Does report history of falls. On PT evaluation, pt appears to be close to functional baseline, performing low pivot transfers with min assist. Recommend trialing slideboard transfer to make home transfers more efficient and safe. Pt will benefit from skilled PT to increase their independence and safety with mobility to allow discharge to the venue listed below.       Follow Up Recommendations Home health PT;Supervision for mobility/OOB    Equipment Recommendations  Other (comment)(slideboard)    Recommendations for Other Services OT consult     Precautions / Restrictions Precautions Precautions: Fall Restrictions Weight Bearing Restrictions: No      Mobility  Bed Mobility Overal bed mobility: Modified Independent             General bed mobility comments: HOB elevated, prefers to get out of left side of bed  Transfers Overall transfer level: Needs assistance Equipment used: None Transfers: Squat Pivot Transfers     Squat pivot transfers: Min assist     General transfer comment: Min assist for transfers from bed > recliner and then recliner > transport chair. Needs cues for hand placement  Ambulation/Gait                Stairs            Wheelchair Mobility    Modified Rankin (Stroke Patients Only)       Balance                                              Pertinent Vitals/Pain Pain Assessment: No/denies pain    Home Living Family/patient expects to be discharged to:: Private residence Living Arrangements: Other relatives(sister) Available Help at Discharge: Family;Available 24 hours/day Type of Home: House Home Access: Ramped entrance     Home Layout: One level Home Equipment: Wheelchair - manual;Bedside commode;Shower seat;None;Cane - single point      Prior Function Level of Independence: Needs assistance   Gait / Transfers Assistance Needed: Non ambulatory and performs low pivot transfers. Pt sister guards with transfers from bed <> w/c and w/c <> car. Pt propels w/c with feet   ADL's / Homemaking Assistance Needed: Pt uses BSC for toileting        Hand Dominance        Extremity/Trunk Assessment   Upper Extremity Assessment Upper Extremity Assessment: Overall WFL for tasks assessed    Lower Extremity Assessment Lower Extremity Assessment: Generalized weakness       Communication   Communication: HOH  Cognition Arousal/Alertness: Awake/alert Behavior During Therapy: WFL for tasks assessed/performed Overall Cognitive Status: Within Functional Limits for tasks assessed  General Comments: Fearful of falling      General Comments      Exercises     Assessment/Plan    PT Assessment Patient needs continued PT services  PT Problem List Decreased strength;Decreased balance;Decreased mobility       PT Treatment Interventions DME instruction;Functional mobility training;Therapeutic activities;Therapeutic exercise;Balance training;Patient/family education    PT Goals (Current goals can be found in the Care Plan section)  Acute Rehab PT Goals Patient Stated Goal: "get to a regular bed." PT Goal Formulation: With patient Time For Goal Achievement: 09/24/18 Potential to Achieve Goals: Good    Frequency  Min 3X/week   Barriers to discharge        Co-evaluation               AM-PAC PT "6 Clicks" Mobility  Outcome Measure Help needed turning from your back to your side while in a flat bed without using bedrails?: None Help needed moving from lying on your back to sitting on the side of a flat bed without using bedrails?: None Help needed moving to and from a bed to a chair (including a wheelchair)?: A Little Help needed standing up from a chair using your arms (e.g., wheelchair or bedside chair)?: A Lot Help needed to walk in hospital room?: A Lot Help needed climbing 3-5 steps with a railing? : Total 6 Click Score: 16    End of Session Equipment Utilized During Treatment: Gait belt Activity Tolerance: Patient tolerated treatment well Patient left: Other (comment);with nursing/sitter in room(in transport chair) Nurse Communication: Mobility status PT Visit Diagnosis: Other abnormalities of gait and mobility (R26.89);Muscle weakness (generalized) (M62.81)    Time: [id number] PT Time Calculation (min) (ACUTE ONLY): 43 min   Charges:   PT Evaluation $PT Eval Moderate Complexity: 1 Mod PT Treatments $Therapeutic Activity: 23-37 mins       Carrie Best, PT, DPT Acute Rehabilitation Services Pager [phone] Office [phone]   Carrie Best 09/10/2018, 4:47 PM

## 2018-09-10 NOTE — ED Notes (Signed)
BFAST TRAY ORDERED 

## 2018-09-10 NOTE — Care Management Note (Signed)
Case Management Note  Patient Details  Name: VENOLA FELLING MRN: [id number] Date of Birth: 1928/08/18  Subjective/Objective:    CHF               Action/Plan: Patient lives at home with family; PCP: Kirby Funk, MD; has private insurance with [facility] with prescription drug coverage; CM following for progression of care.  Expected Discharge Date:   possibly 1/9/20202               Expected Discharge Plan:  Home w Home Health Services  Status of Service:  In process, will continue to follow  Reola Mosher [phone] 09/10/2018, 2:13 PM

## 2018-09-10 NOTE — ED Notes (Signed)
Ordered diet tray for pt  

## 2018-09-10 NOTE — ED Notes (Signed)
Report called to 3E RN. 

## 2018-09-11 DIAGNOSIS — E854 Organ-limited amyloidosis: Secondary | ICD-10-CM | POA: Diagnosis not present

## 2018-09-11 DIAGNOSIS — J9601 Acute respiratory failure with hypoxia: Secondary | ICD-10-CM | POA: Diagnosis not present

## 2018-09-11 DIAGNOSIS — I5033 Acute on chronic diastolic (congestive) heart failure: Secondary | ICD-10-CM | POA: Diagnosis not present

## 2018-09-11 DIAGNOSIS — N182 Chronic kidney disease, stage 2 (mild): Secondary | ICD-10-CM | POA: Diagnosis not present

## 2018-09-11 LAB — CBC
HCT: 35.5 % — ABNORMAL LOW (ref 36.0–46.0)
Hemoglobin: 11.9 g/dL — ABNORMAL LOW (ref 12.0–15.0)
MCH: 31.2 pg (ref 26.0–34.0)
MCHC: 33.5 g/dL (ref 30.0–36.0)
MCV: 93.2 fL (ref 80.0–100.0)
PLATELETS: 210 10*3/uL (ref 150–400)
RBC: 3.81 MIL/uL — AB (ref 3.87–5.11)
RDW: 14.6 % (ref 11.5–15.5)
WBC: 8.1 10*3/uL (ref 4.0–10.5)
nRBC: 0 % (ref 0.0–0.2)

## 2018-09-11 LAB — BASIC METABOLIC PANEL
Anion gap: 6 (ref 5–15)
BUN: 23 mg/dL (ref 8–23)
CO2: 31 mmol/L (ref 22–32)
Calcium: 9.4 mg/dL (ref 8.9–10.3)
Chloride: 101 mmol/L (ref 98–111)
Creatinine, Ser: 1.43 mg/dL — ABNORMAL HIGH (ref 0.44–1.00)
GFR calc Af Amer: 38 mL/min — ABNORMAL LOW (ref >60)
GFR calc non Af Amer: 33 mL/min — ABNORMAL LOW (ref >60)
Glucose, Bld: 124 mg/dL — ABNORMAL HIGH (ref 70–99)
Potassium: 3.4 mmol/L — ABNORMAL LOW (ref 3.5–5.1)
Sodium: 138 mmol/L (ref 135–145)

## 2018-09-11 MED ORDER — TORSEMIDE 20 MG PO TABS: 20.0000 mg | ORAL_TABLET | Freq: Two times a day (BID) | ORAL | Status: DC

## 2018-09-11 NOTE — Care Management Note (Signed)
Case Management Note  Patient Details  Name: Carrie Best MRN: [id number] Date of Birth: 10-08-1927  Subjective/Objective:   Acute respiratory failure with hypoxia, CHF                 Action/Plan: NCM spoke to pt and offered choice for HH/CMS list provided and placed on chart. Pt requested NCM contact her dtr. Spoke to [place], dtr and requested [employer]. Contacted [facility] with new referral. Has wheelchair and bedside commode at home.   Expected Discharge Date:  09/11/18               Expected Discharge Plan:  Home w Home Health Services  In-House Referral:  NA  Discharge planning Services  CM Consult  Post Acute Care Choice:  Home Health Choice offered to:  Patient  DME Arranged:  N/A DME Agency:  NA  HH Arranged:  PT, RN HH Agency:  [facility]  Status of Service:  Completed, signed off  If discussed at Long Length of Stay Meetings, dates discussed:    Additional Comments:  Elliot Cousin, RN 09/11/2018, 2:20 PM

## 2018-09-11 NOTE — Discharge Summary (Signed)
Physician Discharge Summary  Carrie Best RUE:[id number] DOB: [birth year] DOA: 09/09/2018  PCP: Kirby Funk, MD  Admit date: 09/09/2018 Discharge date: 09/11/2018  Admitted From: home Discharge disposition: home   Recommendations for Outpatient Follow-Up:   1. BMP 1 week 2. Home health 3. Encourage compliance with diet (had pack of bacon prior to hospitalization)   Discharge Diagnosis:   Principal Problem:   Acute respiratory failure with hypoxia (HCC) Active Problems:   HTN (hypertension)   GERD (gastroesophageal reflux disease)   Hyperlipidemia   Atrial fibrillation (HCC)   Acute on chronic diastolic CHF (congestive heart failure) (HCC)   Hypokalemia   Chronic kidney disease (CKD), stage II (mild)   Amyloidosis of bladder (HCC)    Discharge Condition: Improved.  Diet recommendation: Low sodium, heart healthy.  Wound care: None.  Code status: Full.   History of Present Illness:   Carrie Best is a 85 y.o. female with medical history significant of A.fib, CHF diastolic, dementia, CVA, HTN , HLD,wheelchair bound due to chronic knee issueand GERD, bladder amyloidosis, CKD BL Cr 1.5    Presented with   shortness of breath for at least 24 hours worse with exertion EMS was called was noted to sat 91% on room air normally not on oxygen reports eating some salty food recently.  Patient at baseline on torsemide 20 mg a day.  Patient felt better once EMS put oxygen on.  But still continues to be somewhat short of breath with any exertion.  Describes mild chest pain associated with this and just few seconds at a time.  Reports she has been taking her medications as prescribed. As of breath worse with lying flat improves when she sits upright.  She feels like prior episodes of CHF.  Reports bilateral lower extremity edema.  She was not taking potasium at home bc she was not aware that is was on her list   Hospital Course by Problem:    Acute respiratory  failure with hypoxia secondary to acute on chronic diastolic heart failure.  -much improved after diuresis   Acute on chronic diastolic heart  Failure. Echo 2 months ago revealed EF of 55% with elevated LV filling pressurechest x-ray as noted above.  -diuresed well with IV lasix -suspect this exacerbation is due to dietary non-compliance so will encourage good food choices and will not adjust demadex yet -is unable to weigh as she does not stand so difficult to monitor for volume   Hypokalemia.  -Patient stopped taking her Kdur  -replete and encourage patient to take and have labs monitor to ensure stability   Hypertension. -resume home meds  atrial fibrillation. Not on anticoagulation secondary to risk of falls. Rate controlled. chadvasc score 6. -continue home meds   CKD II.  -monitor Cr prn   Bladder amyloidosis. OP f/u. Previous biopsies done    Medical Consultants:      Discharge Exam:   Vitals:   09/11/18 0416 09/11/18 1108  BP: (!) 154/70 140/65  Pulse: (!) 59 60  Resp: 18 16  Temp: 99.4 F (37.4 C) 98.8 F (37.1 C)  SpO2: 92% 94%   Vitals:   09/10/18 1900 09/10/18 2012 09/11/18 0416 09/11/18 1108  BP:  (!) 134/56 (!) 154/70 140/65  Pulse:  76 (!) 59 60  Resp:  18 18 16   Temp:  98.8 F (37.1 C) 99.4 F (37.4 C) 98.8 F (37.1 C)  TempSrc:  Oral Oral Oral  SpO2:  91% 92%  94%  Weight: 84 kg  82.7 kg   Height:        General exam: Appears calm and comfortable.    The results of significant diagnostics from this hospitalization (including imaging, microbiology, ancillary and laboratory) are listed below for reference.     Procedures and Diagnostic Studies:   Dg Chest 2 View  Result Date: 09/09/2018 CLINICAL DATA:  Shortness of breath since yesterday. EXAM: CHEST - 2 VIEW COMPARISON:  12/24/2017 FINDINGS: Heart size is normal. There is aortic atherosclerosis. There may be mild venous hypertension. Question early interstitial edema. Small  effusions in the posterior costophrenic angles. No consolidation or lobar collapse. No acute bone finding. IMPRESSION: Suspicion of congestive heart failure with venous hypertension, mild interstitial edema and small effusions. Electronically Signed   By: Paulina Fusi M.D.   On: 09/09/2018 13:37     Labs:   Basic Metabolic Panel: Recent Labs  Lab 09/09/18 1216 09/10/18 0027 09/10/18 0500 09/11/18 0558  NA 138 139 139 138  K 2.8* 3.0* 3.0* 3.4*  CL 98 99 98 101  CO2 29 31 30 31   GLUCOSE 152* 121* 117* 124*  BUN 20 16 16 23   CREATININE 1.30* 1.27* 1.34* 1.43*  CALCIUM 9.7 9.0 9.3 9.4  MG 2.1  --  1.7  --   PHOS  --   --  3.5  --    GFR Estimated Creatinine Clearance: 30.1 mL/min (A) (by C-G formula based on SCr of 1.43 mg/dL (H)). Liver Function Tests: Recent Labs  Lab 09/10/18 0500  AST 21  ALT 11  ALKPHOS 83  BILITOT 1.5*  PROT 6.7  ALBUMIN 3.5   No results for input(s): LIPASE, AMYLASE in the last 168 hours. No results for input(s): AMMONIA in the last 168 hours. Coagulation profile No results for input(s): INR, PROTIME in the last 168 hours.  CBC: Recent Labs  Lab 09/09/18 1216 09/10/18 0500 09/11/18 0558  WBC 10.1 8.6 8.1  NEUTROABS 7.3  --   --   HGB 12.6 11.4* 11.9*  HCT 38.8 35.2* 35.5*  MCV 92.8 93.4 93.2  PLT 205 202 210   Cardiac Enzymes: Recent Labs  Lab 09/09/18 2016 09/10/18 0027 09/10/18 1828  TROPONINI <0.03 <0.03 <0.03   BNP: Invalid input(s): POCBNP CBG: No results for input(s): GLUCAP in the last 168 hours. D-Dimer No results for input(s): DDIMER in the last 72 hours. Hgb A1c No results for input(s): HGBA1C in the last 72 hours. Lipid Profile No results for input(s): CHOL, HDL, LDLCALC, TRIG, CHOLHDL, LDLDIRECT in the last 72 hours. Thyroid function studies Recent Labs    09/10/18 0500  TSH 1.852   Anemia work up No results for input(s): VITAMINB12, FOLATE, FERRITIN, TIBC, IRON, RETICCTPCT in the last 72  hours. Microbiology No results found for this or any previous visit (from the past 240 hour(s)).   Discharge Instructions:   Discharge Instructions    (HEART FAILURE PATIENTS) Call MD:  Anytime you have any of the following symptoms: 1) 3 pound weight gain in 24 hours or 5 pounds in 1 week 2) shortness of breath, with or without a dry hacking cough 3) swelling in the hands, feet or stomach 4) if you have to sleep on extra pillows at night in order to breathe.   Complete by:  As directed    (HEART FAILURE PATIENTS) Call MD:  Anytime you have any of the following symptoms: 1) 3 pound weight gain in 24 hours or 5 pounds in 1  week 2) shortness of breath, with or without a dry hacking cough 3) swelling in the hands, feet or stomach 4) if you have to sleep on extra pillows at night in order to breathe.   Complete by:  As directed    Diet - low sodium heart healthy   Complete by:  As directed    Diet - low sodium heart healthy   Complete by:  As directed    Discharge instructions   Complete by:  As directed    Demadex/torosemide (the diuretic you are taking) can cause you to lose potassium through your urine.  Please take the potassium supplementation that was previously prescribed.  Too much and too little can be dangerous.  (your potassium was 2.8 when you came into the hospital. Normal is around 4) BMP in 1 week with PCP to ensure potassium is stable LOW salt diet Home health   Increase activity slowly   Complete by:  As directed    Increase activity slowly   Complete by:  As directed      Allergies as of 09/11/2018      Reactions   Codeine Nausea And Vomiting   Penicillins Hives   Has patient had a PCN reaction causing immediate rash, facial/tongue/throat swelling, SOB or lightheadedness with hypotension: No Has patient had a PCN reaction causing severe rash involving mucus membranes or skin necrosis: No Has patient had a PCN reaction that required hospitalization No Has patient had a  PCN reaction occurring within the last 10 years: No If all of the above answers are "NO", then may proceed with Cephalosporin use.   Sulfur Nausea And Vomiting      Medication List    TAKE these medications   atorvastatin 20 MG tablet Commonly known as:  LIPITOR Take 20 mg by mouth daily.   CARTIA XT 180 MG 24 hr capsule Generic drug:  diltiazem Take 180 mg by mouth daily.   DULoxetine 60 MG capsule Commonly known as:  CYMBALTA Take 60 mg by mouth daily.   isosorbide mononitrate 30 MG 24 hr tablet Commonly known as:  IMDUR Take 1 tablet (30 mg total) by mouth daily.   meloxicam 15 MG tablet Commonly known as:  MOBIC Take 15 mg by mouth daily.   metoprolol tartrate 25 MG tablet Commonly known as:  LOPRESSOR Take 50 mg by mouth 2 (two) times daily.   potassium chloride SA 20 MEQ tablet Commonly known as:  K-DUR,KLOR-CON Take 1 tablet (20 mEq total) by mouth daily.   torsemide 20 MG tablet Commonly known as:  DEMADEX Take 1 tablet (20 mg total) by mouth 2 (two) times daily.   zolpidem 10 MG tablet Commonly known as:  AMBIEN Take 10 mg by mouth at bedtime.      Follow-up Information    Kirby Funk, MD. Go on 09/25/2018.   Specialty:  Internal Medicine Why:  @ 10:45 am Contact information: [address], [address] [place] [address] [phone]        Nahser, Deloris Ping, MD. Go on 10/04/2018.   Specialty:  Cardiology Why:  10:30 am Contact information: [address] [place] [address] [phone]        [facility] Follow up.   Why:  Home Health Physical Therapy and RN -agency will call to arrange initial visit Contact information: [address] [place] [address] [phone]           Time coordinating discharge: 2  5min  Signed:  Joseph ArtJessica U Allycia Pitz DO  [facility] 09/11/2018, 4:30 PM

## 2018-09-11 NOTE — Evaluation (Signed)
Occupational Therapy Evaluation Patient Details Name: Carrie Best MRN: [id number] DOB: 12-08-1927 Today's Date: 09/11/2018    History of Present Illness Pt is a 85 y.o. F with significant PMH of a. fib, CHF, dementia, CVA, HTN, right TKA who presents with acute respiratory failure with hypoxia secondary to acute on chronic diastolic heart failure.    Clinical Impression   Pt is at baseline level of function with ADLs and min guard A with mobility to [facility] and chair at sink for selfcare. Pt had all necessary DME and A/E at home and no further aute OT is indicated at this time    Follow Up Recommendations  No OT follow up;Supervision - Intermittent    Equipment Recommendations  None recommended by OT    Recommendations for Other Services       Precautions / Restrictions Precautions Precautions: Fall Restrictions Weight Bearing Restrictions: No      Mobility Bed Mobility Overal bed mobility: Modified Independent             General bed mobility comments: Sitting on BSC upon PT arrival.   Transfers Overall transfer level: Needs assistance Equipment used: None Transfers: Sit to/from [employer] CorporationStand;Stand Pivot Transfers Sit to Stand: Min guard Stand pivot transfers: Min guard Squat pivot transfers: Min guard     General transfer comment: Able to stand multiple times from rollator seat and chair to practice transfers to mimic technique at home. SPT BSC to rollator seat to chair with PT stabilizing chair.    Balance Overall balance assessment: Needs assistance Sitting-balance support: Feet supported;No upper extremity supported Sitting balance-Leahy Scale: Good Sitting balance - Comments: Able to assist with washing self up at sink without difficulty   Standing balance support: During functional activity;Single extremity supported Standing balance-Leahy Scale: Fair Standing balance comment: Able to stand at sink and help with wash up with 1 UE support and  close Min guard.                           ADL either performed or assessed with clinical judgement   ADL Overall ADL's : At baseline                                       General ADL Comments: set up with bathing tasks at snik, assist with toileting using BSC. Pt sat and stood at sink for ADLs     Vision Baseline Vision/History: Wears glasses Wears Glasses: Reading only Patient Visual Report: No change from baseline       Perception     Praxis      Pertinent Vitals/Pain Pain Assessment: No/denies pain     Hand Dominance Right   Extremity/Trunk Assessment Upper Extremity Assessment Upper Extremity Assessment: Overall WFL for tasks assessed   Lower Extremity Assessment Lower Extremity Assessment: Defer to PT evaluation       Communication Communication Communication: HOH   Cognition Arousal/Alertness: Awake/alert Behavior During Therapy: WFL for tasks assessed/performed Overall Cognitive Status: No family/caregiver present to determine baseline cognitive functioning                                 General Comments: Hx of dementia but seems [facility] for basic mobility tasks.    General Comments       Exercises  Shoulder Instructions      Home Living Family/patient expects to be discharged to:: Private residence Living Arrangements: Other relatives Available Help at Discharge: Family;Available 24 hours/day Type of Home: House Home Access: Ramped entrance     Home Layout: One level     Bathroom Shower/Tub: Producer, television/film/video: Standard     Home Equipment: Wheelchair - manual;Bedside commode;Shower seat;None;Cane - single point          Prior Functioning/Environment Level of Independence: Needs assistance  Gait / Transfers Assistance Needed: Non ambulatory and performs low pivot transfers. Pt sister guards with transfers from bed <> w/c and w/c <> car. Pt propels w/c with feet  ADL's /  Homemaking Assistance Needed: Pt uses BSC for toileting, set up with bathing            OT Problem List: Decreased activity tolerance;Impaired balance (sitting and/or standing)      OT Treatment/Interventions:      OT Goals(Current goals can be found in the care plan section) Acute Rehab OT Goals Patient Stated Goal: go home OT Goal Formulation: With patient  OT Frequency:     Barriers to D/C:    no barriers       Co-evaluation              AM-PAC OT "6 Clicks" Daily Activity     Outcome Measure Help from another person eating meals?: None   Help from another person toileting, which includes using toliet, bedpan, or urinal?: A Little Help from another person bathing (including washing, rinsing, drying)?: A Little Help from another person to put on and taking off regular upper body clothing?: None Help from another person to put on and taking off regular lower body clothing?: A Little 6 Click Score: 17   End of Session Equipment Utilized During Treatment: Rolling walker;Other (comment)(BSC) Nurse Communication: Mobility status  Activity Tolerance: Patient tolerated treatment well Patient left: in chair;with call bell/phone within reach  OT Visit Diagnosis: Other abnormalities of gait and mobility (R26.89);History of falling (Z91.81)                Time: 0349-1791 OT Time Calculation (min): 57 min Charges:  OT General Charges $OT Visit: 1 Visit OT Evaluation $OT Eval Moderate Complexity: 1 Mod OT Treatments $Self Care/Home Management : 8-22 mins $Therapeutic Activity: 8-22 mins    Galen Manila 09/11/2018, 1:46 PM

## 2018-09-11 NOTE — Discharge Instructions (Signed)

## 2018-09-11 NOTE — Progress Notes (Signed)
Physical Therapy Treatment Patient Details Name: Carrie Best MRN: [id number] DOB: 1928-04-03 Today's Date: 09/11/2018    History of Present Illness Pt is a 85 y.o. F with significant PMH of a. fib, CHF, dementia, CVA, HTN, right TKA who presents with acute respiratory failure with hypoxia secondary to acute on chronic diastolic heart failure.     PT Comments    Patient progressing well towards PT goals. Tolerated multiple standing bouts and SPT to many surfaces during this session with min guard for safety. Attempted to mimic transfers at home using rollator seat as w/c. Able to use feet to maneuver rollator well. Sp02 remained in 90s on RA. Able to stand and participate in washing up with UE support. Pt seems to be functioning close to baseline. Will follow.    Follow Up Recommendations  Home health PT;Supervision for mobility/OOB     Equipment Recommendations  None recommended by PT    Recommendations for Other Services       Precautions / Restrictions Precautions Precautions: Fall Restrictions Weight Bearing Restrictions: No    Mobility  Bed Mobility               General bed mobility comments: Sitting on BSC upon PT arrival.   Transfers Overall transfer level: Needs assistance Equipment used: None Transfers: Sit to/from [employer] Sit to Stand: Min guard Stand pivot transfers: Min guard       General transfer comment: Able to stand multiple times from rollator seat and chair to practice transfers to mimic technique at home. SPT BSC to rollator seat to chair with PT stabilizing chair.  Ambulation/Gait             General Gait Details: non ambulatory at baseline   Stairs             Wheelchair Mobility    Modified Rankin (Stroke Patients Only)       Balance Overall balance assessment: Needs assistance Sitting-balance support: Feet supported;No upper extremity supported Sitting balance-Leahy Scale: Good Sitting  balance - Comments: Able to assist with washing self up at sink without difficulty   Standing balance support: During functional activity;Single extremity supported Standing balance-Leahy Scale: Fair Standing balance comment: Able to stand at sink and help with wash up with 1 UE support and close Min guard.                            Cognition Arousal/Alertness: Awake/alert Behavior During Therapy: WFL for tasks assessed/performed Overall Cognitive Status: No family/caregiver present to determine baseline cognitive functioning                                 General Comments: Hx of dementia but seems [facility] for basic mobility tasks.       Exercises      General Comments        Pertinent Vitals/Pain Pain Assessment: No/denies pain    Home Living                      Prior Function            PT Goals (current goals can now be found in the care plan section) Progress towards PT goals: Progressing toward goals    Frequency    Min 3X/week      PT Plan Current plan remains appropriate    Co-evaluation  AM-PAC PT "6 Clicks" Mobility   Outcome Measure  Help needed turning from your back to your side while in a flat bed without using bedrails?: None Help needed moving from lying on your back to sitting on the side of a flat bed without using bedrails?: None Help needed moving to and from a bed to a chair (including a wheelchair)?: A Little Help needed standing up from a chair using your arms (e.g., wheelchair or bedside chair)?: A Little Help needed to walk in hospital room?: A Lot Help needed climbing 3-5 steps with a railing? : Total 6 Click Score: 17    End of Session Equipment Utilized During Treatment: Gait belt Activity Tolerance: Patient tolerated treatment well Patient left: in chair;with call bell/phone within reach Nurse Communication: Mobility status PT Visit Diagnosis: Other abnormalities of gait and  mobility (R26.89);Muscle weakness (generalized) (M62.81)     Time: 1540-0867 PT Time Calculation (min) (ACUTE ONLY): 29 min  Charges:  $Therapeutic Activity: 8-22 mins                     Mylo Red, PT, DPT Acute Rehabilitation Services Pager [phone] Office [phone]       Blake Divine A Lanier Ensign 09/11/2018, 12:14 PM

## 2018-09-18 ENCOUNTER — Other Ambulatory Visit: Payer: Self-pay | Admitting: Internal Medicine

## 2018-09-18 DIAGNOSIS — M818 Other osteoporosis without current pathological fracture: Secondary | ICD-10-CM

## 2018-10-04 ENCOUNTER — Ambulatory Visit: Payer: Medicare Other | Admitting: Physician Assistant

## 2018-10-10 ENCOUNTER — Encounter: Payer: Self-pay | Admitting: Physician Assistant

## 2018-10-24 ENCOUNTER — Encounter: Payer: Self-pay | Admitting: Cardiovascular Disease

## 2018-10-24 ENCOUNTER — Encounter (INDEPENDENT_AMBULATORY_CARE_PROVIDER_SITE_OTHER): Payer: Self-pay

## 2018-10-24 ENCOUNTER — Ambulatory Visit: Payer: Medicare Other | Admitting: Cardiovascular Disease

## 2018-10-24 VITALS — BP 120/60 | HR 70 | Ht 67.0 in | Wt 184.8 lb

## 2018-10-24 DIAGNOSIS — I5043 Acute on chronic combined systolic (congestive) and diastolic (congestive) heart failure: Secondary | ICD-10-CM

## 2018-10-24 MED ORDER — TORSEMIDE 20 MG PO TABS: 20.0000 mg | ORAL_TABLET | Freq: Every day | ORAL | 3 refills | Status: DC

## 2018-10-24 NOTE — Progress Notes (Signed)
Cardiology Office Note   Date:  10/24/2018   ID:  Carrie Best, DOB 1927-12-07, MRN [id number]  PCP:  Kirby Funk, MD  Cardiologist:   Kristeen Miss, MD   Chief Complaint  Patient presents with  . Congestive Heart Failure   1. Atrial fibrillation 2. Hypertension 3. Dementia 4. Diastolic dysfunction 5. Hyperlipidemia   [name] is an 85 yo who I have seen in the past. She was admitted with respiratory failure last year. She was found to She has developed some dementia. She has had frequent episodes of dyspnea. Typically with exertion. She does not eat any extra salt.   She still feels poorly - has good days and bad days   She is not able to walk any distance primarily due to leg weakness and leg pain.   Jan 16, 2014:  Ms. Holyoak is doing oK. Able to do her normal activies with minimal dyspnea ( she does not do much).     February 06, 2015:  Carrie Best is a 85 y.o. female who presents for her  Atrial fib and chronic diastolic congestive heart failure. She was recently hospitalized for mental status changes ( hypersomulence)  No specific etiology was found   She needs to have knee surgery.   Will need to hold the Xarelto  February 06, 2016: Had her knee surgery last year but it was not successful.   Now she is wheelchair buond. Wants to have shoulder surgery now   Oct. 23, 2018:    Had shoulder surgery .   Had some complications apparently . Had right knee surgery that did not go well.    June 25, 2018:  Has not felt well since last week .  Has had some dyspnea,   Is in a wheelchair all the time  + orthopnea  Does not eat much salty foods  Has had some leg swelling  Takes furosemide 40 BID   Echo last year showed normal LV systolic function  Feb. 18, 2020:  Carrie Best is seen back for follow-up of her atrial fibrillation, hypertension and dementia. She was hospitalized with CHF exacerbation in Jan.     torsemide has been decreased to 20  mg a day .     Past Medical History:  Diagnosis Date  . Anxiety   . Arthritis   . Congestive heart failure (HCC)    Congestive heart failure symptoms  . Depression   . Diastolic dysfunction   . Dyspnea    occasionally, with exertion  . Dysrhythmia 05-10-12   hx. A. Fib., rate is controlled-tx. Xarelto  . GERD (gastroesophageal reflux disease) 05-10-12   tx. with meds as needed  . Hearing loss 05-10-12   bilateral hearing aids.  . Heart murmur   . History of kidney stones   . Hyperlipidemia   . Hypertension   . Pneumonia 05-10-12   dx. in July- tx. antibiotics  . Stroke (HCC) 05-10-12   Lt.CVA note on scans-hx. freq. falls/ none past 6 months 01/21/14  . Wears hearing aid    bilateral    Past Surgical History:  Procedure Laterality Date  . APPENDECTOMY    . CATARACT EXTRACTION, BILATERAL  05-10-12   bilateral  . CYSTOSCOPY WITH RETROGRADE PYELOGRAM, URETEROSCOPY AND STENT PLACEMENT Right 01/31/2014   Procedure: CYSTOSCOPY WITH bilateral  RETROGRADE PYELOGRAM, right URETEROSCOPY AND right  STENT PLACEMENT, bladder biopsy with fulgeration;  Surgeon: Sebastian Ache, MD;  Location: [facility];  Service: Urology;  Laterality: Right;  .  HARDWARE REMOVAL Right 09/09/2014   Procedure: HARDWARE REMOVAL RIGHT SHOULDER;  Surgeon: Mable ParisJustin William Chandler, MD;  Location:  SUR[facility]ERY CENTER;  Service: Orthopedics;  Laterality: Right;  Right shoulder hardware removal  . HOLMIUM LASER APPLICATION Right 01/31/2014   Procedure: HOLMIUM LASER APPLICATION;  Surgeon: Sebastian Acheheodore Manny, MD;  Location: [facility];  Service: Urology;  Laterality: Right;  . HUMERUS IM NAIL Right 06/10/2014   Procedure: INTRAMEDULLARY (IM) NAIL HUMERAL;  Surgeon: Mable ParisJustin William Chandler, MD;  Location: [facility];  Service: Orthopedics;  Laterality: Right;  Right intramedullary humeral nail  . INTERSTIM IMPLANT PLACEMENT  05-10-12   now malfunctioning  . INTERSTIM IMPLANT REMOVAL  05/16/2012   Procedure: REMOVAL OF INTERSTIM IMPLANT;   Surgeon: Martina SinnerScott A MacDiarmid, MD;  Location: [facility];  Service: Urology;  Laterality: N/A;  . JOINT REPLACEMENT    . OTHER SURGICAL HISTORY     Bladder Surgery  . SHOULDER SURGERY    . TOTAL KNEE ARTHROPLASTY Right 02/17/2015   Procedure: TOTAL KNEE ARTHROPLASTY;  Surgeon: Gean BirchwoodFrank Rowan, MD;  Location: [facility];  Service: Orthopedics;  Laterality: Right;     Current Outpatient Medications  Medication Sig Dispense Refill  . atorvastatin (LIPITOR) 20 MG tablet Take 20 mg by mouth daily.    Marland Kitchen. diltiazem (CARTIA XT) 180 MG 24 hr capsule Take 180 mg by mouth daily.    . DULoxetine (CYMBALTA) 60 MG capsule Take 60 mg by mouth daily.     . isosorbide mononitrate (IMDUR) 30 MG 24 hr tablet Take 1 tablet (30 mg total) by mouth daily. 30 tablet 11  . meloxicam (MOBIC) 15 MG tablet Take 15 mg by mouth daily.    . metoprolol tartrate (LOPRESSOR) 25 MG tablet Take 50 mg by mouth 2 (two) times daily.     . potassium chloride SA (K-DUR,KLOR-CON) 20 MEQ tablet Take 1 tablet (20 mEq total) by mouth daily. 30 tablet 11  . torsemide (DEMADEX) 20 MG tablet Take 1 tablet (20 mg total) by mouth 2 (two) times daily.    Marland Kitchen. zolpidem (AMBIEN) 10 MG tablet Take 10 mg by mouth at bedtime.     No current facility-administered medications for this visit.     Allergies:   Codeine; Penicillins; and Sulfur    Social History:  The patient  reports that she has never smoked. She has never used smokeless tobacco. She reports that she does not drink alcohol or use drugs.   Family History:  The patient's family history includes Stroke in her brother and mother.    ROS:  Please see the history of present illness.      All other systems are reviewed and negative.    Physical Exam: Blood pressure 120/60, pulse 70, height 5\' 7"  (1.702 m), weight 184 lb 12.8 oz (83.8 kg), SpO2 94 %.  GEN: elderly female,  NAD  HEENT: Normal NECK: No JVD; No carotid bruits LYMPHATICS: No lymphadenopathy CARDIAC:   Irreg. Irreg.    RESPIRATORY:  Clear to auscultation without rales, wheezing or rhonchi  ABDOMEN: Soft, non-tender, non-distended MUSCULOSKELETAL:  No edema; No deformity  SKIN: Warm and dry NEUROLOGIC:  Alert and oriented x 3  EKG:      Recent Labs: 09/09/2018: B Natriuretic Peptide 309.6 09/10/2018: ALT 11; Magnesium 1.7; TSH 1.852 09/11/2018: BUN 23; Creatinine, Ser 1.43; Hemoglobin 11.9; Platelets 210; Potassium 3.4; Sodium 138    Lipid Panel No results found for: CHOL, TRIG, HDL, CHOLHDL, VLDL, LDLCALC, LDLDIRECT    Wt Readings from Last  3 Encounters:  10/24/18 184 lb 12.8 oz (83.8 kg)  09/11/18 182 lb 5.1 oz (82.7 kg)  07/25/18 183 lb 6.4 oz (83.2 kg)      Other studies Reviewed: Additional studies/ records that were reviewed today include: . Review of the above records demonstrates:   ASSESSMENT AND PLAN:  1. Atrial fibrillation -   Stable.      2. Hypertension -     BP is well controlled.   3. Dementia -    4. Chronic Diastolic dysfunction: . Still eats too much salt.   Had a long discussion about salt intake Gave her permission to take an extra torsemide if needed.   5. Hyperlipidemia :  Stable   Current medicines are reviewed at length with the patient today.  The patient does not have concerns regarding medicines.  The following changes have been made:  no change  Labs/ tests ordered today include:  No orders of the defined types were placed in this encounter.    Disposition:   FU with [place] in 6 months     Kristeen Miss, MD  10/24/2018 2:44 PM    [facility] [address] [place], [place], [place]  [address] Phone: [phone]; Fax: [phone]

## 2018-10-24 NOTE — Patient Instructions (Addendum)
Medication Instructions:  Your physician has recommended you make the following change in your medication:  DECREASE Torsemide (Demadex) to 20 mg once daily  If you need a refill on your cardiac medications before your next appointment, please call your pharmacy.     Lab work: TODAY - basic metabolic panel     Testing/Procedures: None Ordered   Follow-Up: At [facility], you and your health needs are our priority.  As part of our continuing mission to provide you with exceptional heart care, we have created designated Provider Care Teams.  These Care Teams include your primary Cardiologist (physician) and Advanced Practice Providers (APPs -  Physician Assistants and Nurse Practitioners) who all work together to provide you with the care you need, when you need it. You will need a follow up appointment in:  6 months.  Please call our office 2 months in advance to schedule this appointment.  You may see Kristeen Miss, MD or one of the following Advanced Practice Providers on your designated Care Team: Tereso Newcomer, PA-C Vin Ryder, [place] . Berton Bon, NP

## 2018-10-25 LAB — BASIC METABOLIC PANEL
BUN/Creatinine Ratio: 11 — ABNORMAL LOW (ref 12–28)
BUN: 15 mg/dL (ref 8–27)
CO2: 26 mmol/L (ref 20–29)
Calcium: 10 mg/dL (ref 8.7–10.3)
Chloride: 98 mmol/L (ref 96–106)
Creatinine, Ser: 1.36 mg/dL — ABNORMAL HIGH (ref 0.57–1.00)
GFR calc Af Amer: 40 mL/min/{1.73_m2} — ABNORMAL LOW (ref >59)
GFR calc non Af Amer: 35 mL/min/{1.73_m2} — ABNORMAL LOW (ref >59)
Glucose: 129 mg/dL — ABNORMAL HIGH (ref 65–99)
Potassium: 3.5 mmol/L (ref 3.5–5.2)
Sodium: 139 mmol/L (ref 134–144)

## 2018-10-31 ENCOUNTER — Ambulatory Visit: Payer: Medicare Other | Admitting: Physician Assistant

## 2018-11-10 ENCOUNTER — Ambulatory Visit
Admission: RE | Admit: 2018-11-10 | Discharge: 2018-11-10 | Disposition: A | Discharge status: Home / Self Care | Payer: Medicare Other | Source: Ambulatory Visit | Attending: Internal Medicine | Admitting: Internal Medicine

## 2018-11-10 ENCOUNTER — Other Ambulatory Visit: Payer: Self-pay | Admitting: Internal Medicine

## 2018-11-10 DIAGNOSIS — Z13820 Encounter for screening for osteoporosis: Secondary | ICD-10-CM

## 2019-06-18 ENCOUNTER — Inpatient Hospital Stay (HOSPITAL_COMMUNITY)
Admission: EM | Admit: 2019-06-18 | Discharge: 2019-06-26 | DRG: 065 | Disposition: A | Discharge status: Skilled Nursing Facility | Payer: Medicare Other | Attending: Internal Medicine | Admitting: Internal Medicine

## 2019-06-18 ENCOUNTER — Other Ambulatory Visit: Payer: Self-pay

## 2019-06-18 ENCOUNTER — Encounter (HOSPITAL_COMMUNITY): Payer: Self-pay | Admitting: *Deleted

## 2019-06-18 ENCOUNTER — Observation Stay (HOSPITAL_COMMUNITY): Payer: Medicare Other

## 2019-06-18 ENCOUNTER — Emergency Department (HOSPITAL_COMMUNITY): Payer: Medicare Other

## 2019-06-18 DIAGNOSIS — I639 Cerebral infarction, unspecified: Secondary | ICD-10-CM | POA: Diagnosis present

## 2019-06-18 DIAGNOSIS — Z880 Allergy status to penicillin: Secondary | ICD-10-CM

## 2019-06-18 DIAGNOSIS — Z6827 Body mass index (BMI) 27.0-27.9, adult: Secondary | ICD-10-CM

## 2019-06-18 DIAGNOSIS — Z993 Dependence on wheelchair: Secondary | ICD-10-CM

## 2019-06-18 DIAGNOSIS — Z96651 Presence of right artificial knee joint: Secondary | ICD-10-CM | POA: Diagnosis present

## 2019-06-18 DIAGNOSIS — Z79899 Other long term (current) drug therapy: Secondary | ICD-10-CM

## 2019-06-18 DIAGNOSIS — R4182 Altered mental status, unspecified: Secondary | ICD-10-CM

## 2019-06-18 DIAGNOSIS — I313 Pericardial effusion (noninflammatory): Secondary | ICD-10-CM | POA: Diagnosis present

## 2019-06-18 DIAGNOSIS — I63441 Cerebral infarction due to embolism of right cerebellar artery: Secondary | ICD-10-CM | POA: Diagnosis not present

## 2019-06-18 DIAGNOSIS — I63431 Cerebral infarction due to embolism of right posterior cerebral artery: Secondary | ICD-10-CM

## 2019-06-18 DIAGNOSIS — I4821 Permanent atrial fibrillation: Secondary | ICD-10-CM | POA: Diagnosis present

## 2019-06-18 DIAGNOSIS — I4891 Unspecified atrial fibrillation: Secondary | ICD-10-CM | POA: Diagnosis present

## 2019-06-18 DIAGNOSIS — E669 Obesity, unspecified: Secondary | ICD-10-CM | POA: Diagnosis present

## 2019-06-18 DIAGNOSIS — G9340 Encephalopathy, unspecified: Secondary | ICD-10-CM | POA: Diagnosis present

## 2019-06-18 DIAGNOSIS — Z66 Do not resuscitate: Secondary | ICD-10-CM | POA: Diagnosis present

## 2019-06-18 DIAGNOSIS — Z20828 Contact with and (suspected) exposure to other viral communicable diseases: Secondary | ICD-10-CM | POA: Diagnosis present

## 2019-06-18 DIAGNOSIS — I10 Essential (primary) hypertension: Secondary | ICD-10-CM | POA: Diagnosis present

## 2019-06-18 DIAGNOSIS — F419 Anxiety disorder, unspecified: Secondary | ICD-10-CM | POA: Diagnosis present

## 2019-06-18 DIAGNOSIS — Z882 Allergy status to sulfonamides status: Secondary | ICD-10-CM

## 2019-06-18 DIAGNOSIS — I447 Left bundle-branch block, unspecified: Secondary | ICD-10-CM | POA: Diagnosis present

## 2019-06-18 DIAGNOSIS — Z885 Allergy status to narcotic agent status: Secondary | ICD-10-CM

## 2019-06-18 DIAGNOSIS — R41841 Cognitive communication deficit: Secondary | ICD-10-CM | POA: Diagnosis present

## 2019-06-18 DIAGNOSIS — D72829 Elevated white blood cell count, unspecified: Secondary | ICD-10-CM | POA: Diagnosis present

## 2019-06-18 DIAGNOSIS — Z87440 Personal history of urinary (tract) infections: Secondary | ICD-10-CM

## 2019-06-18 DIAGNOSIS — Z791 Long term (current) use of non-steroidal anti-inflammatories (NSAID): Secondary | ICD-10-CM

## 2019-06-18 DIAGNOSIS — I130 Hypertensive heart and chronic kidney disease with heart failure and stage 1 through stage 4 chronic kidney disease, or unspecified chronic kidney disease: Secondary | ICD-10-CM | POA: Diagnosis present

## 2019-06-18 DIAGNOSIS — R739 Hyperglycemia, unspecified: Secondary | ICD-10-CM | POA: Diagnosis present

## 2019-06-18 DIAGNOSIS — R110 Nausea: Secondary | ICD-10-CM | POA: Diagnosis not present

## 2019-06-18 DIAGNOSIS — Z8673 Personal history of transient ischemic attack (TIA), and cerebral infarction without residual deficits: Secondary | ICD-10-CM

## 2019-06-18 DIAGNOSIS — N182 Chronic kidney disease, stage 2 (mild): Secondary | ICD-10-CM | POA: Diagnosis present

## 2019-06-18 DIAGNOSIS — I5032 Chronic diastolic (congestive) heart failure: Secondary | ICD-10-CM | POA: Diagnosis present

## 2019-06-18 DIAGNOSIS — R29701 NIHSS score 1: Secondary | ICD-10-CM | POA: Diagnosis present

## 2019-06-18 DIAGNOSIS — H9190 Unspecified hearing loss, unspecified ear: Secondary | ICD-10-CM | POA: Diagnosis present

## 2019-06-18 DIAGNOSIS — I482 Chronic atrial fibrillation, unspecified: Secondary | ICD-10-CM | POA: Diagnosis present

## 2019-06-18 DIAGNOSIS — E785 Hyperlipidemia, unspecified: Secondary | ICD-10-CM

## 2019-06-18 DIAGNOSIS — K5909 Other constipation: Secondary | ICD-10-CM | POA: Diagnosis present

## 2019-06-18 DIAGNOSIS — Z9181 History of falling: Secondary | ICD-10-CM

## 2019-06-18 DIAGNOSIS — Z823 Family history of stroke: Secondary | ICD-10-CM

## 2019-06-18 DIAGNOSIS — F329 Major depressive disorder, single episode, unspecified: Secondary | ICD-10-CM | POA: Diagnosis present

## 2019-06-18 DIAGNOSIS — G9349 Other encephalopathy: Secondary | ICD-10-CM | POA: Diagnosis present

## 2019-06-18 DIAGNOSIS — M1990 Unspecified osteoarthritis, unspecified site: Secondary | ICD-10-CM | POA: Diagnosis present

## 2019-06-18 LAB — COMPREHENSIVE METABOLIC PANEL
ALT: 16 U/L (ref 0–44)
AST: 26 U/L (ref 15–41)
Albumin: 3.8 g/dL (ref 3.5–5.0)
Alkaline Phosphatase: 108 U/L (ref 38–126)
Anion gap: 11 (ref 5–15)
BUN: 14 mg/dL (ref 8–23)
CO2: 23 mmol/L (ref 22–32)
Calcium: 10 mg/dL (ref 8.9–10.3)
Chloride: 100 mmol/L (ref 98–111)
Creatinine, Ser: 1.22 mg/dL — ABNORMAL HIGH (ref 0.44–1.00)
GFR calc Af Amer: 45 mL/min — ABNORMAL LOW (ref >60)
GFR calc non Af Amer: 39 mL/min — ABNORMAL LOW (ref >60)
Glucose, Bld: 145 mg/dL — ABNORMAL HIGH (ref 70–99)
Potassium: 3.8 mmol/L (ref 3.5–5.1)
Sodium: 134 mmol/L — ABNORMAL LOW (ref 135–145)
Total Bilirubin: 1.1 mg/dL (ref 0.3–1.2)
Total Protein: 7.1 g/dL (ref 6.5–8.1)

## 2019-06-18 LAB — URINALYSIS, ROUTINE W REFLEX MICROSCOPIC
Bilirubin Urine: NEGATIVE
Glucose, UA: NEGATIVE mg/dL
Hgb urine dipstick: NEGATIVE
Ketones, ur: NEGATIVE mg/dL
Leukocytes,Ua: NEGATIVE
Nitrite: NEGATIVE
Protein, ur: NEGATIVE mg/dL
Specific Gravity, Urine: 1.012 (ref 1.005–1.030)
pH: 7 (ref 5.0–8.0)

## 2019-06-18 LAB — CBC
HCT: 37.7 % (ref 36.0–46.0)
Hemoglobin: 13 g/dL (ref 12.0–15.0)
MCH: 31 pg (ref 26.0–34.0)
MCHC: 34.5 g/dL (ref 30.0–36.0)
MCV: 89.8 fL (ref 80.0–100.0)
Platelets: 170 10*3/uL (ref 150–400)
RBC: 4.2 MIL/uL (ref 3.87–5.11)
RDW: 13.8 % (ref 11.5–15.5)
WBC: 11.2 10*3/uL — ABNORMAL HIGH (ref 4.0–10.5)
nRBC: 0 % (ref 0.0–0.2)

## 2019-06-18 LAB — CBG MONITORING, ED: Glucose-Capillary: 143 mg/dL — ABNORMAL HIGH (ref 70–99)

## 2019-06-18 MED ORDER — SODIUM CHLORIDE 0.9 % IV BOLUS
500.0000 mL | Freq: Once | INTRAVENOUS | Status: AC
  Administered 2019-06-18: 500 mL via INTRAVENOUS

## 2019-06-18 MED ORDER — SODIUM CHLORIDE 0.9 % IV SOLN: Freq: Once | INTRAVENOUS | Status: DC

## 2019-06-18 MED ORDER — SODIUM CHLORIDE 0.9% FLUSH: 3.0000 mL | Freq: Once | INTRAVENOUS | Status: DC

## 2019-06-18 NOTE — ED Provider Notes (Addendum)
[facility]-EMERGENCY DEPT Provider Note   CSN: [id number] Arrival date & time: 06/18/19  1619     History   Chief Complaint Chief Complaint  Patient presents with  . Weakness  . Altered Mental Status    HPI Carrie Best is a 85 y.o. female.     Level 5 caveat for altered mental status.  Most of history obtained from nursing notes.  Family reports patient is weak, altered mental status, incontinent of urine last night.  Past medical history of multiple UTIs in the past.  Vital signs have been stable.  Patient is hard of hearing and does not have her hearing aids in.     Past Medical History:  Diagnosis Date  . Anxiety   . Arthritis   . Congestive heart failure (HCC)    Congestive heart failure symptoms  . Depression   . Diastolic dysfunction   . Dyspnea    occasionally, with exertion  . Dysrhythmia 05-10-12   hx. A. Fib., rate is controlled-tx. Xarelto  . GERD (gastroesophageal reflux disease) 05-10-12   tx. with meds as needed  . Hearing loss 05-10-12   bilateral hearing aids.  . Heart murmur   . History of kidney stones   . Hyperlipidemia   . Hypertension   . Pneumonia 05-10-12   dx. in July- tx. antibiotics  . Stroke (HCC) 05-10-12   Lt.CVA note on scans-hx. freq. falls/ none past 6 months 01/21/14  . Wears hearing aid    bilateral    Patient Active Problem List   Diagnosis Date Noted  . Acute respiratory failure with hypoxia (HCC) 09/10/2018  . Chronic kidney disease (CKD), stage II (mild) 09/10/2018  . Amyloidosis of bladder (HCC) 09/10/2018  . CHF exacerbation (HCC) 09/09/2018  . ARF (acute renal failure) (HCC) 07/10/2018  . Nausea & vomiting 07/09/2018  . Hypokalemia 12/25/2017  . Acute on chronic diastolic CHF (congestive heart failure) (HCC) 12/24/2017  . 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 Rupture of right patellar tendon 05/19/2016  . H/O nonunion of fracture 03/16/2016  . Arthritis of knee 02/17/2015  .  Primary osteoarthritis of right knee Valgus 02/14/2015  . Chronic diastolic CHF (congestive heart failure) (HCC) 02/06/2015  . Proximal humerus fracture 06/10/2014  . Obesity 03/18/2012  . HTN (hypertension) 03/18/2012  . GERD (gastroesophageal reflux disease) 03/18/2012  . Hyperlipidemia 03/18/2012  . Sleep disturbance 03/18/2012  . Atrial fibrillation (HCC) 03/18/2012    Past Surgical History:  Procedure Laterality Date  . APPENDECTOMY    . CATARACT EXTRACTION, BILATERAL  05-10-12   bilateral  . CYSTOSCOPY WITH RETROGRADE PYELOGRAM, URETEROSCOPY AND STENT PLACEMENT Right 01/31/2014   Procedure: CYSTOSCOPY WITH bilateral  RETROGRADE PYELOGRAM, right URETEROSCOPY AND right  STENT PLACEMENT, bladder biopsy with fulgeration;  Surgeon: Sebastian Ache, MD;  Location: [facility];  Service: Urology;  Laterality: Right;  . HARDWARE REMOVAL Right 09/09/2014   Procedure: HARDWARE REMOVAL RIGHT SHOULDER;  Surgeon: Mable Paris, MD;  Location: [facility];  Service: Orthopedics;  Laterality: Right;  Right shoulder hardware removal  . HOLMIUM LASER APPLICATION Right 01/31/2014   Procedure: HOLMIUM LASER APPLICATION;  Surgeon: Sebastian Ache, MD;  Location: [facility];  Service: Urology;  Laterality: Right;  . HUMERUS IM NAIL Right 06/10/2014   Procedure: INTRAMEDULLARY (IM) NAIL HUMERAL;  Surgeon: Mable Paris, MD;  [facility];  Service: Orthopedics;  Laterality: Right;  Right intramedullary humeral nail  . INTERSTIM IMPLANT PLACEMENT  05-10-12  now malfunctioning  . INTERSTIM IMPLANT REMOVAL  05/16/2012   Procedure: REMOVAL OF INTERSTIM IMPLANT;  Surgeon: Martina Sinner, MD;  Location: [facility];  Service: Urology;  Laterality: N/A;  . JOINT REPLACEMENT    . OTHER SURGICAL HISTORY     Bladder Surgery  . SHOULDER SURGERY    . TOTAL KNEE ARTHROPLASTY Right 02/17/2015   Procedure: TOTAL KNEE ARTHROPLASTY;  Surgeon: Gean Birchwood, MD;  [facility]: [facility];  Service: Orthopedics;   Laterality: Right;     OB History   No obstetric history on file.      Home Medications    Prior to Admission medications   Medication Sig Start Date End Date Taking? Authorizing Provider  atorvastatin (LIPITOR) 20 MG tablet Take 20 mg by mouth daily.   Yes [provider]  diltiazem (CARTIA XT) 180 MG 24 hr capsule Take 180 mg by mouth daily.   Yes [provider]  DULoxetine (CYMBALTA) 60 MG capsule Take 60 mg by mouth daily.    Yes [provider]  isosorbide mononitrate (IMDUR) 30 MG 24 hr tablet Take 1 tablet (30 mg total) by mouth daily. 07/03/18 06/28/19 Yes Nahser, Deloris Ping, MD  LORazepam (ATIVAN) 0.5 MG tablet Take 0.5 mg by mouth 3 (three) times daily as needed. 06/05/19  Yes [provider]  meloxicam (MOBIC) 15 MG tablet Take 15 mg by mouth daily.   Yes [provider]  metoprolol tartrate (LOPRESSOR) 25 MG tablet Take 50 mg by mouth 2 (two) times daily.  03/23/12  Yes Kirby Funk, MD  potassium chloride SA (K-DUR,KLOR-CON) 20 MEQ tablet Take 1 tablet (20 mEq total) by mouth daily. 01/28/15  Yes Zannie Cove, MD  torsemide (DEMADEX) 20 MG tablet Take 1 tablet (20 mg total) by mouth daily. 10/24/18 10/19/19 Yes Nahser, Deloris Ping, MD  zolpidem (AMBIEN) 10 MG tablet Take 10 mg by mouth at bedtime.   Yes [provider]    Family History Family History  Problem Relation Age of Onset  . Stroke Mother   . Stroke Brother     Social History Social History   Tobacco Use  . Smoking status: Never Smoker  . Smokeless tobacco: Never Used  Substance Use Topics  . Alcohol use: No  . Drug use: No     Allergies   Codeine, Penicillins, and Sulfur   Review of Systems Review of Systems  Unable to perform ROS: Mental status change     Physical Exam Updated Vital Signs BP (!) 150/63   Pulse 73   Temp 99.9 F (37.7 C) (Oral)   Resp 17   SpO2 96%   Physical Exam Vitals signs and nursing note reviewed.   Constitutional:      Appearance: She is well-developed.     Comments: Hard of hearing, pale, no respiratory distress.  HENT:     Head: Normocephalic and atraumatic.  Eyes:     Conjunctiva/sclera: Conjunctivae normal.  Neck:     Musculoskeletal: Neck supple.  Cardiovascular:     Rate and Rhythm: Normal rate and regular rhythm.  Pulmonary:     Effort: Pulmonary effort is normal.     Breath sounds: Normal breath sounds.  Abdominal:     General: Bowel sounds are normal.     Palpations: Abdomen is soft.     Comments: Nontender  Musculoskeletal: Normal range of motion.  Skin:    General: Skin is warm and dry.  Neurological:     Comments: Knows name and  place, but not day.  Moves all extremities.  Psychiatric:     Comments: Flat affect.      ED Treatments / Results  Labs (all labs ordered are listed, but only abnormal results are displayed) Labs Reviewed  COMPREHENSIVE METABOLIC PANEL - Abnormal; Notable for the following components:      Result Value   Sodium 134 (*)    Glucose, Bld 145 (*)    Creatinine, Ser 1.22 (*)    GFR calc non Af Amer 39 (*)    GFR calc Af Amer 45 (*)    All other components within normal limits  CBC - Abnormal; Notable for the following components:   WBC 11.2 (*)    All other components within normal limits  CBG MONITORING, ED - Abnormal; Notable for the following components:   Glucose-Capillary 143 (*)    All other components within normal limits  SARS CORONAVIRUS 2 (TAT 6-24 HRS)  URINALYSIS, ROUTINE W REFLEX MICROSCOPIC    EKG EKG Interpretation  Date/Time:  Monday June 18 2019 17:38:03 EDT Ventricular Rate:  87 PR Interval:    QRS Duration: 144 QT Interval:  402 QTC Calculation: 484 R Axis:   -42 Text Interpretation:  Atrial fibrillation Left bundle branch block Baseline wander in lead(s) I III aVR aVL Confirmed by Nat Christen [phone]) on 06/18/2019 5:47:49 PM   Radiology Ct Head Wo Contrast  Result Date:  06/18/2019 CLINICAL DATA:  85 year old female with altered mental status. EXAM: CT HEAD WITHOUT CONTRAST TECHNIQUE: Contiguous axial images were obtained from the base of the skull through the vertex without intravenous contrast. COMPARISON:  Head CT dated 01/27/2015 FINDINGS: Brain: There is moderate age-related atrophy and advanced chronic microvascular ischemic changes. An area of hypodensity in the medial right occipital lobe (series 5, image 59 and axial series 2 image 38) is age indeterminate. An acute or subacute infarct is not entirely excluded. Clinical correlation is recommended. MRI may provide better evaluation if there is clinical concern for acute infarct. There is no acute intracranial hemorrhage. No mass effect or midline shift. No extra-axial fluid collection. Vascular: No hyperdense vessel or unexpected calcification. Skull: Normal. Negative for fracture or focal lesion. Sinuses/Orbits: No acute finding. Other: None. IMPRESSION: 1. No acute intracranial hemorrhage. 2. Age indeterminate right occipital infarct. An acute or subacute infarct is not excluded. Clinical correlation is recommended. MRI may provide better evaluation if there is clinical concern for an acute infarct. 3. Age-related atrophy and advanced chronic microvascular ischemic changes. These results were called by telephone at the time of interpretation on 06/18/2019 at 6:46 pm to provider [facility] , who verbally acknowledged these results. Electronically Signed   By: Anner Crete M.D.   On: 06/18/2019 18:50    Procedures Procedures (including critical care time)  Medications Ordered in ED Medications  sodium chloride flush (NS) 0.9 % injection 3 mL (has no administration in time range)  0.9 %  sodium chloride infusion (has no administration in time range)  sodium chloride 0.9 % bolus 500 mL (0 mLs Intravenous Stopped 06/18/19 2118)     Initial Impression / Assessment and Plan / ED Course  I have reviewed the  triage vital signs and the nursing notes.  Pertinent labs & imaging results that were available during my care of the patient were reviewed by me and considered in my medical decision making (see chart for details).        History and physical could represent a recurrent UTI.  Will hydrate,  labs, urinalysis, CT head.  Probable admission.  2000: Phone call from radiologist.  Discussed CT head with potential age-indeterminate right occipital infarct.  Urinalysis is clean.  Will admit for further evaluation.   2300 disc c Dr Toniann FailKakrakandy  2315 disc c Dr Amada JupiterKirkpatrick (neurologist).  He reviewed CT scan.  [facility] and get MRI tomorrow.  CRITICAL CARE Performed by: Donnetta HutchingBrian Caleen Taaffe Total critical care time: 30 minutes Critical care time was exclusive of separately billable procedures and treating other patients. Critical care was necessary to treat or prevent imminent or life-threatening deterioration. Critical care was time spent personally by me on the following activities: development of treatment plan with patient and/or surrogate as well as nursing, discussions with consultants, evaluation of patient's response to treatment, examination of patient, obtaining history from patient or surrogate, ordering and performing treatments and interventions, ordering and review of laboratory studies, ordering and review of radiographic studies, pulse oximetry and re-evaluation of patient's condition. Final Clinical Impressions(s) / ED Diagnoses   Final diagnoses:  Altered mental status, unspecified altered mental status type  Nausea    ED Discharge Orders    None       Donnetta Hutchingook, Hoda Hon, MD 06/18/19 1750    Donnetta Hutchingook, Zoey Gilkeson, MD 06/18/19 2153    Donnetta Hutchingook, Lexys Milliner, MD 06/18/19 2302    Donnetta Hutchingook, Beanca Kiester, MD 06/18/19 [id number]    Donnetta Hutchingook, Cystal Shannahan, MD 06/18/19 2318

## 2019-06-18 NOTE — ED Triage Notes (Addendum)
BIB by EMS, family concerned about pt being weak, they feel like she has some AMS, 146/65-80-94% RA CBG 168 #20 RT hand 4mg  Zofran given, Stroke screen negative. Pt was incont last night and has had UTI's in the past with same symptoms

## 2019-06-18 NOTE — ED Notes (Signed)
RN Cari and this Probation officer attempted to [employer] cath patient and patient urinated while attempting cath and missed urine. Ongoing shift aware.

## 2019-06-19 ENCOUNTER — Observation Stay (HOSPITAL_COMMUNITY)
Admit: 2019-06-19 | Discharge: 2019-06-19 | Disposition: A | Discharge status: Home / Self Care | Payer: Medicare Other | Attending: Internal Medicine | Admitting: Internal Medicine

## 2019-06-19 ENCOUNTER — Other Ambulatory Visit: Payer: Self-pay

## 2019-06-19 ENCOUNTER — Encounter (HOSPITAL_COMMUNITY): Payer: Self-pay | Admitting: Internal Medicine

## 2019-06-19 ENCOUNTER — Inpatient Hospital Stay (HOSPITAL_COMMUNITY): Payer: Medicare Other

## 2019-06-19 ENCOUNTER — Observation Stay (HOSPITAL_COMMUNITY): Payer: Medicare Other

## 2019-06-19 DIAGNOSIS — I313 Pericardial effusion (noninflammatory): Secondary | ICD-10-CM | POA: Diagnosis present

## 2019-06-19 DIAGNOSIS — I130 Hypertensive heart and chronic kidney disease with heart failure and stage 1 through stage 4 chronic kidney disease, or unspecified chronic kidney disease: Secondary | ICD-10-CM | POA: Diagnosis present

## 2019-06-19 DIAGNOSIS — I4819 Other persistent atrial fibrillation: Secondary | ICD-10-CM | POA: Diagnosis not present

## 2019-06-19 DIAGNOSIS — D72829 Elevated white blood cell count, unspecified: Secondary | ICD-10-CM | POA: Diagnosis present

## 2019-06-19 DIAGNOSIS — Z993 Dependence on wheelchair: Secondary | ICD-10-CM | POA: Diagnosis not present

## 2019-06-19 DIAGNOSIS — I361 Nonrheumatic tricuspid (valve) insufficiency: Secondary | ICD-10-CM | POA: Diagnosis not present

## 2019-06-19 DIAGNOSIS — Z96651 Presence of right artificial knee joint: Secondary | ICD-10-CM | POA: Diagnosis present

## 2019-06-19 DIAGNOSIS — R41841 Cognitive communication deficit: Secondary | ICD-10-CM | POA: Diagnosis present

## 2019-06-19 DIAGNOSIS — N182 Chronic kidney disease, stage 2 (mild): Secondary | ICD-10-CM | POA: Diagnosis present

## 2019-06-19 DIAGNOSIS — R110 Nausea: Secondary | ICD-10-CM | POA: Diagnosis present

## 2019-06-19 DIAGNOSIS — Z20828 Contact with and (suspected) exposure to other viral communicable diseases: Secondary | ICD-10-CM | POA: Diagnosis present

## 2019-06-19 DIAGNOSIS — Z9181 History of falling: Secondary | ICD-10-CM | POA: Diagnosis not present

## 2019-06-19 DIAGNOSIS — Z66 Do not resuscitate: Secondary | ICD-10-CM | POA: Diagnosis present

## 2019-06-19 DIAGNOSIS — R29701 NIHSS score 1: Secondary | ICD-10-CM | POA: Diagnosis present

## 2019-06-19 DIAGNOSIS — E669 Obesity, unspecified: Secondary | ICD-10-CM | POA: Diagnosis present

## 2019-06-19 DIAGNOSIS — I63441 Cerebral infarction due to embolism of right cerebellar artery: Secondary | ICD-10-CM | POA: Diagnosis present

## 2019-06-19 DIAGNOSIS — I5032 Chronic diastolic (congestive) heart failure: Secondary | ICD-10-CM | POA: Diagnosis present

## 2019-06-19 DIAGNOSIS — I10 Essential (primary) hypertension: Secondary | ICD-10-CM

## 2019-06-19 DIAGNOSIS — K5909 Other constipation: Secondary | ICD-10-CM | POA: Diagnosis present

## 2019-06-19 DIAGNOSIS — I480 Paroxysmal atrial fibrillation: Secondary | ICD-10-CM | POA: Diagnosis not present

## 2019-06-19 DIAGNOSIS — G9349 Other encephalopathy: Secondary | ICD-10-CM | POA: Diagnosis present

## 2019-06-19 DIAGNOSIS — R739 Hyperglycemia, unspecified: Secondary | ICD-10-CM | POA: Diagnosis present

## 2019-06-19 DIAGNOSIS — Z6827 Body mass index (BMI) 27.0-27.9, adult: Secondary | ICD-10-CM | POA: Diagnosis not present

## 2019-06-19 DIAGNOSIS — Z8673 Personal history of transient ischemic attack (TIA), and cerebral infarction without residual deficits: Secondary | ICD-10-CM | POA: Diagnosis not present

## 2019-06-19 DIAGNOSIS — G9340 Encephalopathy, unspecified: Secondary | ICD-10-CM | POA: Diagnosis not present

## 2019-06-19 DIAGNOSIS — I447 Left bundle-branch block, unspecified: Secondary | ICD-10-CM | POA: Diagnosis present

## 2019-06-19 DIAGNOSIS — Z823 Family history of stroke: Secondary | ICD-10-CM | POA: Diagnosis not present

## 2019-06-19 DIAGNOSIS — I639 Cerebral infarction, unspecified: Secondary | ICD-10-CM | POA: Diagnosis present

## 2019-06-19 DIAGNOSIS — I4821 Permanent atrial fibrillation: Secondary | ICD-10-CM | POA: Diagnosis present

## 2019-06-19 DIAGNOSIS — E785 Hyperlipidemia, unspecified: Secondary | ICD-10-CM | POA: Diagnosis present

## 2019-06-19 DIAGNOSIS — H9190 Unspecified hearing loss, unspecified ear: Secondary | ICD-10-CM | POA: Diagnosis present

## 2019-06-19 LAB — CBC
HCT: 37.7 % (ref 36.0–46.0)
HCT: 38 % (ref 36.0–46.0)
Hemoglobin: 12.8 g/dL (ref 12.0–15.0)
Hemoglobin: 12.9 g/dL (ref 12.0–15.0)
MCH: 30.5 pg (ref 26.0–34.0)
MCH: 30.6 pg (ref 26.0–34.0)
MCHC: 34 g/dL (ref 30.0–36.0)
MCHC: 33.9 g/dL (ref 30.0–36.0)
MCV: 90 fL (ref 80.0–100.0)
MCV: 90.3 fL (ref 80.0–100.0)
Platelets: 164 10*3/uL (ref 150–400)
Platelets: 166 10*3/uL (ref 150–400)
RBC: 4.19 MIL/uL (ref 3.87–5.11)
RBC: 4.21 MIL/uL (ref 3.87–5.11)
RDW: 13.8 % (ref 11.5–15.5)
RDW: 13.8 % (ref 11.5–15.5)
WBC: 9.2 10*3/uL (ref 4.0–10.5)
WBC: 9.2 10*3/uL (ref 4.0–10.5)
nRBC: 0 % (ref 0.0–0.2)
nRBC: 0 % (ref 0.0–0.2)

## 2019-06-19 LAB — BASIC METABOLIC PANEL
Anion gap: 8 (ref 5–15)
BUN: 16 mg/dL (ref 8–23)
CO2: 25 mmol/L (ref 22–32)
Calcium: 10.1 mg/dL (ref 8.9–10.3)
Chloride: 103 mmol/L (ref 98–111)
Creatinine, Ser: 1.14 mg/dL — ABNORMAL HIGH (ref 0.44–1.00)
GFR calc Af Amer: 49 mL/min — ABNORMAL LOW (ref >60)
GFR calc non Af Amer: 43 mL/min — ABNORMAL LOW (ref >60)
Glucose, Bld: 119 mg/dL — ABNORMAL HIGH (ref 70–99)
Potassium: 3.6 mmol/L (ref 3.5–5.1)
Sodium: 136 mmol/L (ref 135–145)

## 2019-06-19 LAB — CREATININE, SERUM
Creatinine, Ser: 1.12 mg/dL — ABNORMAL HIGH (ref 0.44–1.00)
GFR calc Af Amer: 50 mL/min — ABNORMAL LOW (ref >60)
GFR calc non Af Amer: 44 mL/min — ABNORMAL LOW (ref >60)

## 2019-06-19 LAB — SARS CORONAVIRUS 2 (TAT 6-24 HRS): SARS Coronavirus 2: NEGATIVE

## 2019-06-19 LAB — AMMONIA: Ammonia: 37 umol/L — ABNORMAL HIGH (ref 9–35)

## 2019-06-19 MED ORDER — PROMETHAZINE HCL 25 MG/ML IJ SOLN
12.5000 mg | Freq: Four times a day (QID) | INTRAMUSCULAR | Status: DC | PRN
  Administered 2019-06-19 – 2019-06-20 (×2): 12.5 mg via INTRAVENOUS
  Filled 2019-06-19 (×2): qty 1

## 2019-06-19 MED ORDER — LORAZEPAM 2 MG/ML IJ SOLN
1.0000 mg | Freq: Once | INTRAMUSCULAR | Status: AC
  Administered 2019-06-19: 1 mg via INTRAVENOUS
  Filled 2019-06-19: qty 1

## 2019-06-19 MED ORDER — DILTIAZEM HCL ER COATED BEADS 180 MG PO CP24
180.0000 mg | ORAL_CAPSULE | Freq: Every day | ORAL | Status: DC
  Administered 2019-06-19: 180 mg via ORAL
  Filled 2019-06-19: qty 1

## 2019-06-19 MED ORDER — ISOSORBIDE MONONITRATE ER 30 MG PO TB24
30.0000 mg | ORAL_TABLET | Freq: Every day | ORAL | Status: DC
  Administered 2019-06-19: 30 mg via ORAL
  Filled 2019-06-19: qty 1

## 2019-06-19 MED ORDER — ONDANSETRON HCL 4 MG PO TABS: 4.0000 mg | ORAL_TABLET | Freq: Four times a day (QID) | ORAL | Status: DC | PRN

## 2019-06-19 MED ORDER — ONDANSETRON HCL 4 MG/2ML IJ SOLN
4.0000 mg | Freq: Four times a day (QID) | INTRAMUSCULAR | Status: DC | PRN
  Administered 2019-06-19: 4 mg via INTRAVENOUS
  Filled 2019-06-19: qty 2

## 2019-06-19 MED ORDER — ENOXAPARIN SODIUM 40 MG/0.4ML ~~LOC~~ SOLN
40.0000 mg | SUBCUTANEOUS | Status: DC
  Administered 2019-06-19 – 2019-06-22 (×4): 40 mg via SUBCUTANEOUS
  Filled 2019-06-19 (×4): qty 0.4

## 2019-06-19 MED ORDER — METOPROLOL TARTRATE 50 MG PO TABS
50.0000 mg | ORAL_TABLET | Freq: Two times a day (BID) | ORAL | Status: DC
  Administered 2019-06-19 – 2019-06-26 (×16): 50 mg via ORAL
  Filled 2019-06-19: qty 1
  Filled 2019-06-19: qty 2
  Filled 2019-06-19 (×4): qty 1
  Filled 2019-06-19: qty 2
  Filled 2019-06-19 (×8): qty 1

## 2019-06-19 MED ORDER — ACETAMINOPHEN 325 MG PO TABS: 650.0000 mg | ORAL_TABLET | Freq: Four times a day (QID) | ORAL | Status: DC | PRN

## 2019-06-19 MED ORDER — SODIUM CHLORIDE 0.9 % IV SOLN
INTRAVENOUS | Status: DC
  Administered 2019-06-19 – 2019-06-20 (×2): via INTRAVENOUS

## 2019-06-19 MED ORDER — ACETAMINOPHEN 650 MG RE SUPP: 650.0000 mg | Freq: Four times a day (QID) | RECTAL | Status: DC | PRN

## 2019-06-19 MED ORDER — ASPIRIN 325 MG PO TABS
325.0000 mg | ORAL_TABLET | Freq: Every day | ORAL | Status: DC
  Administered 2019-06-19 – 2019-06-22 (×4): 325 mg via ORAL
  Filled 2019-06-19 (×4): qty 1

## 2019-06-19 MED ORDER — DULOXETINE HCL 60 MG PO CPEP
60.0000 mg | ORAL_CAPSULE | Freq: Every day | ORAL | Status: DC
  Administered 2019-06-19 – 2019-06-26 (×8): 60 mg via ORAL
  Filled 2019-06-19: qty 2
  Filled 2019-06-19 (×7): qty 1

## 2019-06-19 MED ORDER — IOHEXOL 350 MG/ML SOLN
100.0000 mL | Freq: Once | INTRAVENOUS | Status: AC | PRN
  Administered 2019-06-19: 100 mL via INTRAVENOUS

## 2019-06-19 MED ORDER — ATORVASTATIN CALCIUM 10 MG PO TABS
20.0000 mg | ORAL_TABLET | Freq: Every day | ORAL | Status: DC
  Administered 2019-06-19: 20 mg via ORAL
  Filled 2019-06-19: qty 1

## 2019-06-19 MED ORDER — STROKE: EARLY STAGES OF RECOVERY BOOK
Freq: Once | Status: DC
  Filled 2019-06-19: qty 1

## 2019-06-19 NOTE — ED Notes (Signed)
Patient refused breakfast tray 

## 2019-06-19 NOTE — H&P (Addendum)
History and Physical    CAELEY DOHRMANN BDZ:[id number] DOB: 07/31/1928 DOA: 06/18/2019  PCP: [name], MD  Patient coming from: Home.  Chief Complaint: Confusion and weakness.  History obtained from patient's daughter.  HPI: Carrie Best is a 85 y.o. female with history of atrial fibrillation, diastolic dysfunction, chronic kidney disease stage II, chronically bedbound was brought to the ER after patient was found to be increasingly confused and lethargic over the last 24 hours.  Patient's daughter provided the history.  As per the patient's daughter the night before last patient started become increasingly confused which continued since yesterday morning.  Patient became more lethargic and moved her bowels in a diaper which usually does not do.  Given the ongoing confusion patient was brought to the ER.  ED Course: In the ER patient was initially only oriented to name and gradually started following commands.  CT head shows age indeterminant cerebellar infarct.  ER physician discussed with on-call neurologist Dr. Leonel Ramsay who advised to get MRI brain but to keep patient addressing and also make sure there is no infectious process.  At the time of my exam patient is alert awake oriented to name moving all extremities.  Labs show WBC 11.2 creatinine 1.2 sodium 134 COVID-19 negative.  UA unremarkable.  EKG shows A. fib. With LBBB rate control.  Chest x-ray does not show anything acute.  Review of Systems: As per HPI, rest all negative.   Past Medical History:  Diagnosis Date  . Anxiety   . Arthritis   . Congestive heart failure (HCC)    Congestive heart failure symptoms  . Depression   . Diastolic dysfunction   . Dyspnea    occasionally, with exertion  . Dysrhythmia 05-10-12   hx. A. Fib., rate is controlled-tx. Xarelto  . GERD (gastroesophageal reflux disease) 05-10-12   tx. with meds as needed  . Hearing loss 05-10-12   bilateral hearing aids.  . Heart murmur   . History  of kidney stones   . Hyperlipidemia   . Hypertension   . Pneumonia 05-10-12   dx. in July- tx. antibiotics  . Stroke ([place]) 05-10-12   Lt.CVA note on scans-hx. freq. falls/ none past 6 months 01/21/14  . Wears hearing aid    bilateral    Past Surgical History:  Procedure Laterality Date  . APPENDECTOMY    . CATARACT EXTRACTION, BILATERAL  05-10-12   bilateral  . CYSTOSCOPY WITH RETROGRADE PYELOGRAM, URETEROSCOPY AND STENT PLACEMENT Right 01/31/2014   Procedure: CYSTOSCOPY WITH bilateral  RETROGRADE PYELOGRAM, right URETEROSCOPY AND right  STENT PLACEMENT, bladder biopsy with fulgeration;  Surgeon: Alexis Frock, MD;  Location: [facility];  Service: Urology;  Laterality: Right;  . HARDWARE REMOVAL Right 09/09/2014   Procedure: HARDWARE REMOVAL RIGHT SHOULDER;  Surgeon: Nita Sells, MD;  Location: [facility];  Service: Orthopedics;  Laterality: Right;  Right shoulder hardware removal  . HOLMIUM LASER APPLICATION Right 3/41/9622   Procedure: HOLMIUM LASER APPLICATION;  Surgeon: Alexis Frock, MD;  Location: [facility];  Service: Urology;  Laterality: Right;  . HUMERUS IM NAIL Right 06/10/2014   Procedure: INTRAMEDULLARY (IM) NAIL HUMERAL;  Surgeon: Nita Sells, MD;  Location: [facility];  Service: Orthopedics;  Laterality: Right;  Right intramedullary humeral nail  . INTERSTIM IMPLANT PLACEMENT  05-10-12   now malfunctioning  . INTERSTIM IMPLANT REMOVAL  05/16/2012   Procedure: REMOVAL OF INTERSTIM IMPLANT;  Surgeon: Reece Packer, MD;  Location: [facility];  Service: Urology;  Laterality: N/A;  . JOINT REPLACEMENT    . OTHER SURGICAL HISTORY     Bladder Surgery  . SHOULDER SURGERY    . TOTAL KNEE ARTHROPLASTY Right 02/17/2015   Procedure: TOTAL KNEE ARTHROPLASTY;  Surgeon: Gean BirchwoodFrank Rowan, MD;  Location: [facility];  Service: Orthopedics;  Laterality: Right;     reports that she has never smoked. She has never used smokeless tobacco. She reports that she does not drink alcohol  or use drugs.  Allergies  Allergen Reactions  . Codeine Nausea And Vomiting  . Penicillins Hives    Has patient had a PCN reaction causing immediate rash, facial/tongue/throat swelling, SOB or lightheadedness with hypotension: No Has patient had a PCN reaction causing severe rash involving mucus membranes or skin necrosis: No Has patient had a PCN reaction that required hospitalization No Has patient had a PCN reaction occurring within the last 10 years: No If all of the above answers are "NO", then may proceed with Cephalosporin use.   . Sulfur Nausea And Vomiting    Family History  Problem Relation Age of Onset  . Stroke Mother   . Stroke Brother     Prior to Admission medications   Medication Sig Start Date End Date Taking? Authorizing Provider  atorvastatin (LIPITOR) 20 MG tablet Take 20 mg by mouth daily.   Yes [provider]  diltiazem (CARTIA XT) 180 MG 24 hr capsule Take 180 mg by mouth daily.   Yes [provider]  DULoxetine (CYMBALTA) 60 MG capsule Take 60 mg by mouth daily.    Yes [provider]  isosorbide mononitrate (IMDUR) 30 MG 24 hr tablet Take 1 tablet (30 mg total) by mouth daily. 07/03/18 06/28/19 Yes Nahser, Deloris PingPhilip J, MD  LORazepam (ATIVAN) 0.5 MG tablet Take 0.5 mg by mouth 3 (three) times daily as needed. 06/05/19  Yes [provider]  meloxicam (MOBIC) 15 MG tablet Take 15 mg by mouth daily.   Yes [provider]  metoprolol tartrate (LOPRESSOR) 25 MG tablet Take 50 mg by mouth 2 (two) times daily.  03/23/12  Yes Kirby FunkGriffin, John, MD  potassium chloride SA (K-DUR,KLOR-CON) 20 MEQ tablet Take 1 tablet (20 mEq total) by mouth daily. 01/28/15  Yes Zannie CoveJoseph, Preetha, MD  torsemide (DEMADEX) 20 MG tablet Take 1 tablet (20 mg total) by mouth daily. 10/24/18 10/19/19 Yes Nahser, Deloris PingPhilip J, MD  zolpidem (AMBIEN) 10 MG tablet Take 10 mg by mouth at bedtime.   Yes [provider]    Physical Exam: Constitutional:  Moderately built and nourished. Vitals:   06/18/19 2330 06/19/19 0000 06/19/19 0030 06/19/19 0127  BP: (!) 161/76 (!) 148/67 (!) 143/68   Pulse: 67 79 89   Resp: 20 15 17    Temp:    98.7 F (37.1 C)  TempSrc:    Oral  SpO2: 94% 96% 91%    Eyes: Anicteric no pallor. ENMT: No discharge from the ears eyes nose or mouth. Neck: No mass felt.  No neck rigidity. Respiratory: No rhonchi or crepitations. Cardiovascular: S1-S2 heard. Abdomen: Soft nontender bowel sounds present. Musculoskeletal: No edema. Skin: No rash. Neurologic: Alert awake oriented to her name.  Moves all extremities.  No facial asymmetry. Psychiatric: Oriented to name.   Labs on Admission: I have personally reviewed following labs and imaging studies  CBC: Recent Labs  Lab 06/18/19 1713  WBC 11.2*  HGB 13.0  HCT 37.7  MCV 89.8  PLT 170   Basic Metabolic Panel: Recent Labs  Lab 06/18/19  1713  NA 134*  K 3.8  CL 100  CO2 23  GLUCOSE 145*  BUN 14  CREATININE 1.22*  CALCIUM 10.0   GFR: CrCl cannot be calculated (Unknown ideal weight.). Liver Function Tests: Recent Labs  Lab 06/18/19 1713  AST 26  ALT 16  ALKPHOS 108  BILITOT 1.1  PROT 7.1  ALBUMIN 3.8   No results for input(s): LIPASE, AMYLASE in the last 168 hours. No results for input(s): AMMONIA in the last 168 hours. Coagulation Profile: No results for input(s): INR, PROTIME in the last 168 hours. Cardiac Enzymes: No results for input(s): CKTOTAL, CKMB, CKMBINDEX, TROPONINI in the last 168 hours. BNP (last 3 results) No results for input(s): PROBNP in the last 8760 hours. HbA1C: No results for input(s): HGBA1C in the last 72 hours. CBG: Recent Labs  Lab 06/18/19 1711  GLUCAP 143*   Lipid Profile: No results for input(s): CHOL, HDL, LDLCALC, TRIG, CHOLHDL, LDLDIRECT in the last 72 hours. Thyroid Function Tests: No results for input(s): TSH, T4TOTAL, FREET4, T3FREE, THYROIDAB in the last 72 hours. Anemia Panel: No results for  input(s): VITAMINB12, FOLATE, FERRITIN, TIBC, IRON, RETICCTPCT in the last 72 hours. Urine analysis:    Component Value Date/Time   COLORURINE YELLOW 06/18/2019 2019   APPEARANCEUR CLEAR 06/18/2019 2019   LABSPEC 1.012 06/18/2019 2019   PHURINE 7.0 06/18/2019 2019   GLUCOSEU NEGATIVE 06/18/2019 2019   HGBUR NEGATIVE 06/18/2019 2019   BILIRUBINUR NEGATIVE 06/18/2019 2019   KETONESUR NEGATIVE 06/18/2019 2019   PROTEINUR NEGATIVE 06/18/2019 2019   UROBILINOGEN 0.2 02/07/2015 1534   NITRITE NEGATIVE 06/18/2019 2019   LEUKOCYTESUR NEGATIVE 06/18/2019 2019   Sepsis Labs: (procalcitonin:4,lacticidven:4) )No results found for this or any previous visit (from the past 240 hour(s)).   Radiological Exams on Admission: Ct Head Wo Contrast  Result Date: 06/18/2019 CLINICAL DATA:  85 year old female with altered mental status. EXAM: CT HEAD WITHOUT CONTRAST TECHNIQUE: Contiguous axial images were obtained from the base of the skull through the vertex without intravenous contrast. COMPARISON:  Head CT dated 01/27/2015 FINDINGS: Brain: There is moderate age-related atrophy and advanced chronic microvascular ischemic changes. An area of hypodensity in the medial right occipital lobe (series 5, image 59 and axial series 2 image 74) is age indeterminate. An acute or subacute infarct is not entirely excluded. Clinical correlation is recommended. MRI may provide better evaluation if there is clinical concern for acute infarct. There is no acute intracranial hemorrhage. No mass effect or midline shift. No extra-axial fluid collection. Vascular: No hyperdense vessel or unexpected calcification. Skull: Normal. Negative for fracture or focal lesion. Sinuses/Orbits: No acute finding. Other: None. IMPRESSION: 1. No acute intracranial hemorrhage. 2. Age indeterminate right occipital infarct. An acute or subacute infarct is not excluded. Clinical correlation is recommended. MRI may provide better evaluation if  there is clinical concern for an acute infarct. 3. Age-related atrophy and advanced chronic microvascular ischemic changes. These results were called by telephone at the time of interpretation on 06/18/2019 at 6:46 pm to provider [facility] , who verbally acknowledged these results. Electronically Signed   By: Elgie Collard M.D.   On: 06/18/2019 18:50   Dg Chest Port 1 View  Result Date: 06/18/2019 CLINICAL DATA:  85 year old female with weakness and altered mental status. EXAM: PORTABLE CHEST 1 VIEW COMPARISON:  Chest radiograph dated 09/09/2018 FINDINGS: Evaluation is limited as the left costophrenic angle has been excluded from the image. The visualized lungs are clear. No pleural effusion or pneumothorax. Stable cardiac  silhouette. Atherosclerotic calcification of the aortic arch. No acute osseous pathology. Right humeral intramedullary rod. IMPRESSION: No active disease. Electronically Signed   By: Elgie Collard M.D.   On: 06/18/2019 23:56    EKG: Independently reviewed.  A. fib rate controlled..  Assessment/Plan Principal Problem:   Acute encephalopathy Active Problems:   HTN (hypertension)   Atrial fibrillation (HCC)   Chronic diastolic CHF (congestive heart failure) (HCC)   Chronic kidney disease (CKD), stage II (mild)    1. Acute encephalopathy -cause not clear.  CT head shows age indeterminant right occipital infarct.  I did discuss with on-call neurologist Dr. Amada Jupiter who advised to get MRI brain.  We will also get ammonia levels EEG.  Patient at this time does not show any signs of infection however blood cultures were ordered.  Will hold Xanax and Ambien for now. 2. Hypertension we will continue with Cardizem and metoprolol Imdur. 3. History of paroxysmal atrial fibrillation not on anticoagulation secondary risk for fall.  On Cardizem and metoprolol. 4. Chronic kidney disease stage II creatinine appears to be at baseline. 5. History of chronic diastolic dysfunction will  hold torsemide for now.   DVT prophylaxis: Lovenox. Code Status: DNR confirmed with patient's daughter. Family Communication: Patient's daughter. Disposition Plan: Home. Consults called: Discussed with neurologist. Admission status: Observation.   Eduard Clos MD [facility] Pager [phone].  If 7PM-7AM, please contact night-coverage [email or web address] Password [email or web address]  06/19/2019, 1:28 AM

## 2019-06-19 NOTE — Progress Notes (Signed)
19:17-19:57During bedside report pt with acute change from clear speech to garbled speech, Unable to articulate her words. Page into on call provider. Awaiting call back. ICU RRT notified as well.  Able to tell that she is in the hospital and the year. Unable to tell who the president is. Pupils are sluggish, grips are equal and fair grip for her age. Spoke with provider aware of changes. Cont with plan of care.

## 2019-06-19 NOTE — Progress Notes (Signed)
Assumed care of the pt from [facility]. Condition stable. Cont with plan of care

## 2019-06-19 NOTE — Progress Notes (Signed)
Pt had a 3.09 second pause. MD notified. No changes in initial assessment. AFIB rate 69. Spoke with MD not new orders. Will notify if it occurs again. Condition stable vital satble

## 2019-06-19 NOTE — Progress Notes (Addendum)
Care started prior to midnight in the emergency room and she was admitted after midnight this morning by my colleague Dr. Gean Birchwood and I am in agreement with this assessment and plan.  Additional changes to plan of care have been made accordingly.  The patient is in 85 year old Caucasian female with a past medical history significant for atrial fibrillation, diastolic dysfunction, CKD stage II, chronically bedbound and other comorbidities was brought him to the emergency room and found to be increasingly confused and lethargic over the last 24 hours.  Patient is unable to provide a subjective history and patient's daughter provided most of the history and stated that the night before last the patient started becoming increasingly confused which continued since yesterday morning.  She became more lethargic and moved her bowels in her diaper which she does not usually do and given the ongoing confusion she brought the patient to the emergency room.  Patient started improving slightly and head CT showed an age-indeterminate cerebellar infarct.  ED physician called neurologist Dr. Leonel Ramsay who advised to get a brain MRI but he recommended to keep advised the patient and making sure there is no infectious process.  Admitted for acute encephalopathy with unclear etiology and will be further worked up with an MRI, and EEG and an ammonia level which was 37.  Her Xanax and Ambien will be held for now and she did not exhibit any signs or symptoms of infection but blood cultures were ordered and urinalysis was unremarkable  Additionally the patient was treated for her hypertension with Cardizem and metoprolol as well as Imdur and patient is not on any anticoagulation due to being a fall risk for her proximal atrial fibrillation.  Patient's creatinine has been stable and she is having hyperglycemia.  Mental status this morning was improving when I examined her.  We will continue to monitor patient's clinical  response to intervention and repeat blood work in the a.m.  We will follow-up on her brain MRI as well as EEG.  **Addendum 6967: MRI is back and has been reviewed and she has an acute CVA.  I discussed the case with Dr. Lorraine Lax of Neurology who recommends transfer to [facility] for full stroke work-up and evaluation by stroke team in a.m.  Patient will be transferred to a telemetry bed and will change patient from observation to inpatient status given her acute CVA

## 2019-06-19 NOTE — Progress Notes (Signed)
Pt alert but not able to answer questions. Awating daughter to call for screening questions. Unable to scan untill we speak with daughter for safety reasons.

## 2019-06-19 NOTE — Progress Notes (Signed)
EEG completed, results pending. 

## 2019-06-19 NOTE — Progress Notes (Signed)
Spoke with MD in regards to pt dry heaving. Per MD pt has to be transferred to [facility] due to acute findings.

## 2019-06-19 NOTE — ED Notes (Signed)
Dr. Kakrakandy at bedside. 

## 2019-06-19 NOTE — Progress Notes (Signed)
C/o nausea and dry heaves. phenergan 12.5 given. Awaiting transfer to [facility]

## 2019-06-20 ENCOUNTER — Inpatient Hospital Stay (HOSPITAL_COMMUNITY): Payer: Medicare Other

## 2019-06-20 DIAGNOSIS — I361 Nonrheumatic tricuspid (valve) insufficiency: Secondary | ICD-10-CM

## 2019-06-20 LAB — HEMOGLOBIN A1C
Hgb A1c MFr Bld: 5.9 % — ABNORMAL HIGH (ref 4.8–5.6)
Mean Plasma Glucose: 122.63 mg/dL

## 2019-06-20 LAB — LIPID PANEL
Cholesterol: 126 mg/dL (ref 0–200)
HDL: 45 mg/dL (ref >40)
LDL Cholesterol: 66 mg/dL (ref 0–99)
Total CHOL/HDL Ratio: 2.8 RATIO
Triglycerides: 75 mg/dL (ref <150)
VLDL: 15 mg/dL (ref 0–40)

## 2019-06-20 MED ORDER — DILTIAZEM HCL ER COATED BEADS 180 MG PO CP24
180.0000 mg | ORAL_CAPSULE | Freq: Every day | ORAL | Status: DC
  Administered 2019-06-21 – 2019-06-26 (×6): 180 mg via ORAL
  Filled 2019-06-20 (×6): qty 1

## 2019-06-20 MED ORDER — ATORVASTATIN CALCIUM 80 MG PO TABS
80.0000 mg | ORAL_TABLET | Freq: Every day | ORAL | Status: DC
  Administered 2019-06-20 – 2019-06-22 (×3): 80 mg via ORAL
  Filled 2019-06-20: qty 1
  Filled 2019-06-20: qty 2
  Filled 2019-06-20: qty 1

## 2019-06-20 MED ORDER — ISOSORBIDE MONONITRATE ER 30 MG PO TB24
30.0000 mg | ORAL_TABLET | Freq: Every day | ORAL | Status: DC
  Administered 2019-06-21 – 2019-06-26 (×6): 30 mg via ORAL
  Filled 2019-06-20 (×6): qty 1

## 2019-06-20 NOTE — NC FL2 (Signed)
[place] MEDICAID FL2 LEVEL OF CARE SCREENING TOOL     IDENTIFICATION  Patient Name: Carrie Best Birthdate: 05/04/1928 Sex: female Admission Date (Current Location): 06/18/2019  [facility] and [place] Number:  Herbalist and Address:  [facility] [place], [place] [address], [place], [place] [address]      Provider Number: [id number]  Attending Physician Name and Address:  Kayleen Memos, DO  Relative Name and Phone Number:       Current Level of Care: Hospital Recommended Level of Care: [place] Prior Approval Number:    Date Approved/Denied:   PASRR Number: [id number]  Discharge Plan: SNF    Current Diagnoses: Patient Active Problem List   Diagnosis Date Noted  . CVA (cerebral vascular accident) ([place]) 06/19/2019  . Acute encephalopathy 06/18/2019  . Acute respiratory failure with hypoxia ([place]) 09/10/2018  . Chronic kidney disease (CKD), stage II (mild) 09/10/2018  . 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 CHF exacerbation ([place]) 09/09/2018  . ARF (acute renal failure) ([place]) 07/10/2018  . Nausea & vomiting 07/09/2018  . Hypokalemia 12/25/2017  . Acute on chronic diastolic CHF (congestive heart failure) ([place]) 12/24/2017  . 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 Rupture of right patellar tendon 05/19/2016  . H/O nonunion of fracture 03/16/2016  . Arthritis of knee 02/17/2015  . Primary osteoarthritis of right knee Valgus 02/14/2015  . Chronic diastolic CHF (congestive heart failure) ([place]) 02/06/2015  . Proximal humerus fracture 06/10/2014  . Obesity 03/18/2012  . HTN (hypertension) 03/18/2012  . GERD (gastroesophageal reflux disease) 03/18/2012  . Hyperlipidemia 03/18/2012  . Sleep disturbance 03/18/2012  . Atrial fibrillation ([place]) 03/18/2012    Orientation RESPIRATION BLADDER Height & Weight     Self, Situation, Place  Normal Incontinent Weight:   Height:     BEHAVIORAL SYMPTOMS/MOOD NEUROLOGICAL  BOWEL NUTRITION STATUS      Continent Diet(Heart healthy with thin liquids)  AMBULATORY STATUS COMMUNICATION OF NEEDS Skin   Extensive Assist Verbally Bruising(Bruising to BLE)                       Personal Care Assistance Level of Assistance  Bathing, Dressing, Feeding Bathing Assistance: Limited assistance Feeding assistance: Independent Dressing Assistance: Limited assistance     Functional Limitations Info  Sight, Speech, Hearing Sight Info: Impaired Hearing Info: Impaired Speech Info: Adequate    SPECIAL CARE FACTORS FREQUENCY  PT (By licensed PT), OT (By licensed OT), Speech therapy     PT Frequency: 5x/wk OT Frequency: 5x/wk     Speech Therapy Frequency: 5x/wk      Contractures Contractures Info: Not present    Additional Factors Info  Code Status, Allergies, Psychotropic Code Status Info: DNR Allergies Info: Codeine/ Penicillin/ Sulfur Psychotropic Info: Cymbalta DR 60 mg PO daily         Current Medications (06/20/2019):  This is the current hospital active medication list Current Facility-Administered Medications  Medication Dose Route Frequency Provider Last Rate Last Dose  .  stroke: mapping our early stages of recovery book   Does not apply Once [facility], Omair Latif, DO      . 0.9 %  sodium chloride infusion   Intravenous Continuous Raiford Noble [place], DO 75 mL/hr at 06/20/19 0024    . acetaminophen (TYLENOL) tablet 650 mg  650 mg Oral Q6H PRN Rise Patience, MD       Or  . acetaminophen (TYLENOL) suppository 650 mg  650  mg Rectal Q6H PRN Eduard Clos, MD      . aspirin tablet 325 mg  325 mg Oral Daily Marguerita Merles [place], [place]   325 mg at 06/20/19 4627  . atorvastatin (LIPITOR) tablet 80 mg  80 mg Oral Daily Dow Adolph N, DO   80 mg at 06/20/19 0912  . [START ON 06/21/2019] diltiazem (CARDIZEM CD) 24 hr capsule 180 mg  180 mg Oral Daily Hall, Carole N, DO      . DULoxetine (CYMBALTA) DR capsule 60 mg  60 mg Oral Daily Eduard Clos, MD   60 mg at 06/20/19 0912  . enoxaparin (LOVENOX) injection 40 mg  40 mg Subcutaneous Q24H Eduard Clos, MD   40 mg at 06/20/19 0913  . [START ON 06/21/2019] isosorbide mononitrate (IMDUR) 24 hr tablet 30 mg  30 mg Oral Daily Hall, Carole N, DO      . metoprolol tartrate (LOPRESSOR) tablet 50 mg  50 mg Oral BID Eduard Clos, MD   50 mg at 06/20/19 0912  . ondansetron (ZOFRAN) tablet 4 mg  4 mg Oral Q6H PRN Eduard Clos, MD       Or  . ondansetron [facility]) injection 4 mg  4 mg Intravenous Q6H PRN Eduard Clos, MD   4 mg at 06/19/19 1341  . promethazine (PHENERGAN) injection 12.5 mg  12.5 mg Intravenous Q6H PRN Marguerita Merles Latif, DO   12.5 mg at 06/19/19 1847  . sodium chloride flush (NS) 0.9 % injection 3 mL  3 mL Intravenous Once Eduard Clos, MD   Stopped at 06/19/19 0201     Discharge Medications: Please see discharge summary for a list of discharge medications.  Relevant Imaging Results:  Relevant Lab Results:   Additional Information SS#: [id number]  Kermit Balo, RN

## 2019-06-20 NOTE — Progress Notes (Addendum)
PROGRESS NOTE  Carrie Best ZOX:[id number] DOB: 1928/08/23 DOA: 06/18/2019 PCP: Kirby Funk, MD  HPI/Recap of past 24 hours: Carrie Best is a 85 y.o. female with history of atrial fibrillation, diastolic dysfunction, chronic kidney disease stage II, chronically bedbound was brought to the ER after patient was found to be increasingly confused and lethargic over the last 24 hours.  Patient's daughter provided the history.  As per the patient's daughter the night before last patient started become increasingly confused which continued since yesterday morning.  Patient became more lethargic and moved her bowels in a diaper which usually does not do.  Given the ongoing confusion patient was brought to the ER.  ED Course: In the ER patient was initially only oriented to name and gradually started following commands.  CT head shows age indeterminant cerebellar infarct.  ER physician discussed with on-call neurologist Dr. Amada Jupiter who advised to get MRI brain but to keep patient addressing and also make sure there is no infectious process.  At the time of my exam patient is alert awake oriented to name moving all extremities.  Labs show WBC 11.2 creatinine 1.2 sodium 134 COVID-19 negative.  UA unremarkable.  EKG shows A. fib. With LBBB rate control.  Chest x-ray does not show anything acute.  06/20/19: Patient was seen and examined at her bedside this morning.  No acute events overnight.  She has no new complaints.  She is alert and oriented x3.  Assessment/Plan: Principal Problem:   Acute encephalopathy Active Problems:   HTN (hypertension)   Atrial fibrillation (HCC)   Chronic diastolic CHF (congestive heart failure) (HCC)   Chronic kidney disease (CKD), stage II (mild)   CVA (cerebral vascular accident) (HCC)   Acute ischemic CVA, right parieto-occipital Seen on MRI brain Per medical records, last seen normal on 06/16/19  Admitted for stroke work-up LDL 66 goal less than  70 Carrie Best 5.9 goal less than 7.0 2D echo pending Increase Lipitor to 80 mg daily Currently on ASA 325 mg Goal BP normotensive gradually Resume diltiazem and Imdur on 06/21/19 Continue beta-blocker for rate control PT OT/speech therapist  Essential hypertension Blood pressure is normotensive Resume diltiazem and Imdur on 06/21/19 Currently on beta-blocker for rate control  Paroxysmal A. fib not on anticoagulation secondary to fall risk Rate controlled on metoprolol Also diltiazem at home Continue to monitor on telemetry  Chronic diastolic CHF Continue to hold off torsemide for now Strict I's and O's and daily weight Monitor volume status  Leukocytosis Presented with mild leukocytosis WBC 11.2 Blood cultures x2 obtained on 06/19/19 in process  DVT prophylaxis: Lovenox sq daily Code Status: DNR  Family Communication: None at bedside Disposition Plan: Home pending PT OT assessment and clinical course Consults called: Neurology    Objective: Vitals:   06/19/19 2230 06/20/19 0028 06/20/19 0400 06/20/19 0749  BP: (!) 141/78 134/72 121/73 116/68  Pulse: 92  75 74  Resp: Temp: 99.3 F (37.4 C) 98.5 F (36.9 C) 98.1 F (36.7 C) 98.7 F (37.1 C)  TempSrc: Oral Oral Oral Oral  SpO2: 92% 97% 96% 95%   No intake or output data in the 24 hours ending 06/20/19 0951 There were no vitals filed for this visit.  Exam:  . General: 85 y.o. year-old female well developed well nourished in no acute distress.  Alert and oriented x3. . Cardiovascular: Regular rate and rhythm with no rubs or gallops.  No thyromegaly or JVD noted.   Marland Kitchen  Respiratory: Clear to auscultation with no wheezes or rales. Good inspiratory effort. . Abdomen: Soft nontender nondistended with normal bowel sounds x4 quadrants. . Musculoskeletal: Trace lower extremity edema. 2/4 pulses in all 4 extremities. Marland Kitchen. Psychiatry: Mood is appropriate for condition and setting   Data Reviewed: CBC: Recent Labs   Lab 06/18/19 1713 06/19/19 0300 06/19/19 0455  WBC 11.2* 9.2 9.2  HGB 13.0 12.8 12.9  HCT 37.7 37.7 38.0  MCV 89.8 90.0 90.3  PLT 170 164 166   Basic Metabolic Panel: Recent Labs  Lab 06/18/19 1713 06/19/19 0300 06/19/19 0455  NA 134*  --  136  K 3.8  --  3.6  CL 100  --  103  CO2 23  --  25  GLUCOSE 145*  --  119*  BUN 14  --  16  CREATININE 1.22* 1.12* 1.14*  CALCIUM 10.0  --  10.1   GFR: CrCl cannot be calculated (Unknown ideal weight.). Liver Function Tests: Recent Labs  Lab 06/18/19 1713  AST 26  ALT 16  ALKPHOS 108  BILITOT 1.1  PROT 7.1  ALBUMIN 3.8   No results for input(s): LIPASE, AMYLASE in the last 168 hours. Recent Labs  Lab 06/19/19 0547  AMMONIA 37*   Coagulation Profile: No results for input(s): INR, PROTIME in the last 168 hours. Cardiac Enzymes: No results for input(s): CKTOTAL, CKMB, CKMBINDEX, TROPONINI in the last 168 hours. BNP (last 3 results) No results for input(s): PROBNP in the last 8760 hours. HbA1C: Recent Labs    06/20/19 0559  HGBA1C 5.9*   CBG: Recent Labs  Lab 06/18/19 1711  GLUCAP 143*   Lipid Profile: Recent Labs    06/20/19 0559  CHOL 126  HDL 45  LDLCALC 66  TRIG 75  CHOLHDL 2.8   Thyroid Function Tests: No results for input(s): TSH, T4TOTAL, FREET4, T3FREE, THYROIDAB in the last 72 hours. Anemia Panel: No results for input(s): VITAMINB12, FOLATE, FERRITIN, TIBC, IRON, RETICCTPCT in the last 72 hours. Urine analysis:    Component Value Date/Time   COLORURINE YELLOW 06/18/2019 2019   APPEARANCEUR CLEAR 06/18/2019 2019   LABSPEC 1.012 06/18/2019 2019   PHURINE 7.0 06/18/2019 2019   GLUCOSEU NEGATIVE 06/18/2019 2019   HGBUR NEGATIVE 06/18/2019 2019   BILIRUBINUR NEGATIVE 06/18/2019 2019   KETONESUR NEGATIVE 06/18/2019 2019   PROTEINUR NEGATIVE 06/18/2019 2019   UROBILINOGEN 0.2 02/07/2015 1534   NITRITE NEGATIVE 06/18/2019 2019   LEUKOCYTESUR NEGATIVE 06/18/2019 2019   Sepsis  Labs: @LABRCNTIP (procalcitonin:4,lacticidven:4)  ) Recent Results (from the past 240 hour(s))  SARS CORONAVIRUS 2 (TAT 6-24 HRS) Nasopharyngeal Nasopharyngeal Swab     Status: None   Collection Time: 06/18/19  5:45 PM   Specimen: Nasopharyngeal Swab  Result Value Ref Range Status   SARS Coronavirus 2 NEGATIVE NEGATIVE Final    Comment: (NOTE) SARS-CoV-2 target nucleic acids are NOT DETECTED. The SARS-CoV-2 RNA is generally detectable in upper and lower respiratory specimens during the acute phase of infection. Negative results do not preclude SARS-CoV-2 infection, do not rule out co-infections with other pathogens, and should not be used as the sole basis for treatment or other patient management decisions. Negative results must be combined with clinical observations, patient history, and epidemiological information. The expected result is Negative. Fact Sheet for Patients: HairSlick.nohttps://www.fda.gov/media/138098/download Fact Sheet for Healthcare Providers: quierodirigir.comhttps://www.fda.gov/media/138095/download This test is not yet approved or cleared by the [place] FDA and  has been authorized for detection and/or diagnosis of SARS-CoV-2 by FDA under an Emergency Use  Authorization (EUA). This EUA will remain  in effect (meaning this test can be used) for the duration of the COVID-19 declaration under Section 56 4(b)(1) of the Act, 21 U.S.C. section 360bbb-3(b)(1), unless the authorization is terminated or revoked sooner. Performed at [facility], [place] [address] [place], [place] [address]       Studies: Ct Angio Head W Or Wo Contrast  Result Date: 06/20/2019 CLINICAL DATA:  85 year old female with weakness and altered mental status. Right PCA/occipital pole infarct on brain MRI earlier today. EXAM: CT ANGIOGRAPHY HEAD AND NECK TECHNIQUE: Multidetector CT imaging of the head and neck was performed using the standard protocol during bolus administration of intravenous contrast.  Multiplanar CT image reconstructions and MIPs were obtained to evaluate the vascular anatomy. Carotid stenosis measurements (when applicable) are obtained utilizing NASCET criteria, using the distal internal carotid diameter as the denominator. CONTRAST:  163mL OMNIPAQUE IOHEXOL 350 MG/ML SOLN COMPARISON:  Brain MRI earlier today. Head CT 06/18/2019. FINDINGS: CT HEAD Brain: Stable small area of cytotoxic edema in the right superior occipital pole from the CT on 06/18/2019. No associated hemorrhage or mass effect. Elsewhere stable non contrast CT appearance of the brain. Confluent bilateral cerebral white matter hypodensity. Calvarium and skull base: No acute osseous abnormality identified. Paranasal sinuses: Visualized paranasal sinuses and mastoids are stable and well pneumatized. Orbits: No acute orbit or scalp soft tissue findings. CTA NECK Skeleton: Mild for age degenerative changes in the spine. Chronic appearing mild upper thoracic superior endplate compression. No acute osseous abnormality identified. Upper chest: Negative. Other neck: Partially retropharyngeal course of the right carotid, normal variant. No acute findings. Aortic arch: 3 vessel arch configuration. Mild for age Calcified aortic atherosclerosis. Right carotid system: Negative brachiocephalic artery and right CCA origin. Calcified plaque at the right carotid bifurcation mostly affecting the ECA origin. Tortuous cervical right ICA without stenosis. Left carotid system: Negative left CCA. Moderate calcified plaque at the posterior left ICA origin and bulb but stenosis is less than 50 % with respect to the distal vessel. Vertebral arteries: Normal proximal right subclavian artery and right vertebral artery origin. Tortuous right V 2 segment. Patent right vertebral artery to the skull base without stenosis. Mild plaque in the proximal left subclavian artery without stenosis. Normal left vertebral artery origin. Codominant vertebral arteries.  Tortuous left V2 segment. Patent left vertebral to the skull base without stenosis. CTA HEAD Posterior circulation: No distal vertebral plaque or stenosis. Patent PICA origins and vertebrobasilar junction. Patent basilar artery without stenosis. Normal SCA and left PCA origins. Fetal type right PCA origin. Left posterior communicating artery diminutive or absent. Mild left P1 and P2 segment irregularity without stenosis. Left PCA branches are within normal limits. Right posterior communicating artery and proximal right PCA are within normal limits. Patent right PCA bifurcation. Superior left PCA branches appear normal. There is a short segment high-grade stenosis of the inferior right P3 division on series 16, image 17 but preserved distal enhancement. Anterior circulation: Both ICA siphons are patent. On the left there is mild calcified plaque without stenosis. On the right there is mild to moderate calcified plaque without stenosis. Normal right posterior communicating artery origin. Patent carotid termini. Normal MCA and ACA origins. Ectatic anterior communicating artery but otherwise bilateral ACA branches are within normal limits. Left MCA M1 segment and bifurcation are patent without stenosis. Left MCA branches are within normal limits. Right MCA M1 segment and bifurcation are patent without stenosis. Right MCA branches are within normal limits.  Venous sinuses: Early contrast timing, but the superior sagittal sinus, dominant right transverse and sigmoid sinuses are patent along with the right IJ bulb. Anatomic variants: Fetal type right PCA origin. Review of the MIP images confirms the above findings IMPRESSION: 1. Negative for large vessel occlusion or stenosis. 2. Positive for a short segment high-grade stenosis of the inferior Right PCA P3 division, but appears unrelated to the recent superior right occipital pole infarct. 3. Mild for age carotid atherosclerosis. Normal vertebral arteries and basilar. 4.  Stable CT appearance of the small right occipital lobe infarct. No associated hemorrhage or mass effect. 5. No new intracranial abnormality. Electronically Signed   By: Odessa Fleming M.D.   On: 06/20/2019 00:05   Ct Angio Neck W Or Wo Contrast  Result Date: 06/20/2019 CLINICAL DATA:  85 year old female with weakness and altered mental status. Right PCA/occipital pole infarct on brain MRI earlier today. EXAM: CT ANGIOGRAPHY HEAD AND NECK TECHNIQUE: Multidetector CT imaging of the head and neck was performed using the standard protocol during bolus administration of intravenous contrast. Multiplanar CT image reconstructions and MIPs were obtained to evaluate the vascular anatomy. Carotid stenosis measurements (when applicable) are obtained utilizing NASCET criteria, using the distal internal carotid diameter as the denominator. CONTRAST:  OMNIPAQUE IOHEXOL 350 MG/ML SOLN COMPARISON:  Brain MRI earlier today. Head CT 06/18/2019. FINDINGS: CT HEAD Brain: Stable small area of cytotoxic edema in the right superior occipital pole from the CT on 06/18/2019. No associated hemorrhage or mass effect. Elsewhere stable non contrast CT appearance of the brain. Confluent bilateral cerebral white matter hypodensity. Calvarium and skull base: No acute osseous abnormality identified. Paranasal sinuses: Visualized paranasal sinuses and mastoids are stable and well pneumatized. Orbits: No acute orbit or scalp soft tissue findings. CTA NECK Skeleton: Mild for age degenerative changes in the spine. Chronic appearing mild upper thoracic superior endplate compression. No acute osseous abnormality identified. Upper chest: Negative. Other neck: Partially retropharyngeal course of the right carotid, normal variant. No acute findings. Aortic arch: 3 vessel arch configuration. Mild for age Calcified aortic atherosclerosis. Right carotid system: Negative brachiocephalic artery and right CCA origin. Calcified plaque at the right carotid  bifurcation mostly affecting the ECA origin. Tortuous cervical right ICA without stenosis. Left carotid system: Negative left CCA. Moderate calcified plaque at the posterior left ICA origin and bulb but stenosis is less than 50 % with respect to the distal vessel. Vertebral arteries: Normal proximal right subclavian artery and right vertebral artery origin. Tortuous right V 2 segment. Patent right vertebral artery to the skull base without stenosis. Mild plaque in the proximal left subclavian artery without stenosis. Normal left vertebral artery origin. Codominant vertebral arteries. Tortuous left V2 segment. Patent left vertebral to the skull base without stenosis. CTA HEAD Posterior circulation: No distal vertebral plaque or stenosis. Patent PICA origins and vertebrobasilar junction. Patent basilar artery without stenosis. Normal SCA and left PCA origins. Fetal type right PCA origin. Left posterior communicating artery diminutive or absent. Mild left P1 and P2 segment irregularity without stenosis. Left PCA branches are within normal limits. Right posterior communicating artery and proximal right PCA are within normal limits. Patent right PCA bifurcation. Superior left PCA branches appear normal. There is a short segment high-grade stenosis of the inferior right P3 division on series 16, image 17 but preserved distal enhancement. Anterior circulation: Both ICA siphons are patent. On the left there is mild calcified plaque without stenosis. On the right there is mild to moderate calcified plaque  without stenosis. Normal right posterior communicating artery origin. Patent carotid termini. Normal MCA and ACA origins. Ectatic anterior communicating artery but otherwise bilateral ACA branches are within normal limits. Left MCA M1 segment and bifurcation are patent without stenosis. Left MCA branches are within normal limits. Right MCA M1 segment and bifurcation are patent without stenosis. Right MCA branches are within  normal limits. Venous sinuses: Early contrast timing, but the superior sagittal sinus, dominant right transverse and sigmoid sinuses are patent along with the right IJ bulb. Anatomic variants: Fetal type right PCA origin. Review of the MIP images confirms the above findings IMPRESSION: 1. Negative for large vessel occlusion or stenosis. 2. Positive for a short segment high-grade stenosis of the inferior Right PCA P3 division, but appears unrelated to the recent superior right occipital pole infarct. 3. Mild for age carotid atherosclerosis. Normal vertebral arteries and basilar. 4. Stable CT appearance of the small right occipital lobe infarct. No associated hemorrhage or mass effect. 5. No new intracranial abnormality. Electronically Signed   By: Odessa Fleming M.D.   On: 06/20/2019 00:05   Mr Brain Wo Contrast  Result Date: 06/19/2019 CLINICAL DATA:  Weakness and altered mental status.  Lethargy. EXAM: MRI HEAD WITHOUT CONTRAST TECHNIQUE: Multiplanar, multiecho pulse sequences of the brain and surrounding structures were obtained without intravenous contrast. COMPARISON:  Head CT 06/18/2019 FINDINGS: Brain: Diffusion imaging shows a 2-3 cm region of acute infarction in the right occipital parietal junction region. Mild swelling but no mass effect or hemorrhage. Elsewhere, there chronic small-vessel ischemic changes of the pons. No focal cerebellar insult. Cerebral hemispheres show atrophy with chronic small-vessel ischemic changes throughout the white matter. No mass, hydrocephalus or extra-axial collection. Vascular: Major vessels at the base of the brain show flow. Skull and upper cervical spine: Negative Sinuses/Orbits: Clear/normal Other: None IMPRESSION: 2-3 cm acute infarction at the right parietooccipital junction. Mild swelling but no mass effect, shift or hemorrhage. Background pattern of acute extensive atrophy and chronic small-vessel ischemic changes elsewhere throughout the brain Electronically Signed    By: Paulina Fusi M.D.   On: 06/19/2019 15:12    Scheduled Meds: .  stroke: mapping our early stages of recovery book   Does not apply Once  . aspirin  325 mg Oral Daily  . atorvastatin  80 mg Oral Daily  . [START ON 06/21/2019] diltiazem  180 mg Oral Daily  . DULoxetine  60 mg Oral Daily  . enoxaparin (LOVENOX) injection  40 mg Subcutaneous Q24H  . [START ON 06/21/2019] isosorbide mononitrate  30 mg Oral Daily  . metoprolol tartrate  50 mg Oral BID  . sodium chloride flush  3 mL Intravenous Once    Continuous Infusions: . sodium chloride 75 mL/hr at 06/20/19 0024     LOS: 1 day     Darlin Drop, MD [facility] Pager [phone]  If 7PM-7AM, please contact night-coverage [email or web address] Password [email or web address] 06/20/2019, 9:51 AM

## 2019-06-20 NOTE — Evaluation (Signed)
Physical Therapy Evaluation Patient Details Name: Carrie Best MRN: [id number] DOB: November 13, 1927 Today's Date: 06/20/2019   History of Present Illness  Carrie Best is a 85 y.o. 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  with history of atrial fibrillation, diastolic dysfunction, chronic kidney disease stage II, chronically wheelchair bound.  Clinical Impression  Pt presents to PT s/p R parieto-occipital infarct with confusion and weakness. Pt appears to be close to her baseline upon evaluation, although pt may be an unreliable historian due to confusion, no family available to confirm history. Pt reports transferring from bed to wheelchair and commode at home with assistance from family. Pt reports independent mobility in wheelchair at household level. Pt remains somewhat confused but this could be baseline. Pt will benefit from PT POC to improve transfer quality and reduce falls risk in the home.    Follow Up Recommendations Home health PT    Equipment Recommendations  None recommended by PT    Recommendations for Other Services       Precautions / Restrictions Precautions Precautions: Fall Restrictions Weight Bearing Restrictions: No      Mobility  Bed Mobility Overal bed mobility: Independent             General bed mobility comments: HOB raised  Transfers Overall transfer level: Needs assistance Equipment used: 1 person hand held assist Transfers: Squat Pivot Transfers     Squat pivot transfers: Min assist     General transfer comment: pt unable to stand 2/2 R knee extension deficits  Ambulation/Gait                Stairs            Wheelchair Mobility    Modified Rankin (Stroke Patients Only) Modified Rankin (Stroke Patients Only) Pre-Morbid Rankin Score: Moderately severe disability Modified Rankin: Moderately severe disability     Balance Overall balance assessment: Modified Independent                                            Pertinent Vitals/Pain Pain Assessment: No/denies pain    Home Living Family/patient expects to be discharged to:: Private residence Living Arrangements: Children(dtr susie, DIL debra) Available Help at Discharge: Family;Available 24 hours/day Type of Home: House Home Access: Ramped entrance     Home Layout: One level Home Equipment: Grab bars - toilet;Wheelchair - manual;Bedside commode;Cane - single point      Prior Function Level of Independence: Needs assistance   Gait / Transfers Assistance Needed: Non ambulatory- expressing transfer +1 to w/c and propels herself  ADL's / Homemaking Assistance Needed: Seated for sink bath        Hand Dominance   Dominant Hand: Right    Extremity/Trunk Assessment   Upper Extremity Assessment Upper Extremity Assessment: Defer to OT evaluation    Lower Extremity Assessment Lower Extremity Assessment: Generalized weakness;RLE deficits/detail RLE Deficits / Details: pt lacking ~20-30 degrees R knee extension (chronic) RLE Sensation: WNL       Communication   Communication: HOH  Cognition Arousal/Alertness: Awake/alert Behavior During Therapy: WFL for tasks assessed/performed Overall Cognitive Status: No family/caregiver present to determine baseline cognitive functioning                                 General Comments: following commands,  oriented to self, place, and situation. Believes it is 1981 but does know month and is close on date.      General Comments      Exercises     Assessment/Plan    PT Assessment Patient needs continued PT services  PT Problem List Decreased range of motion;Decreased mobility;Decreased safety awareness;Decreased knowledge of precautions;Decreased knowledge of use of DME       PT Treatment Interventions Functional mobility training;Therapeutic activities;Therapeutic exercise;Balance training;Neuromuscular  re-education;Wheelchair mobility training;Patient/family education    PT Goals (Current goals can be found in the Care Plan section)  Acute Rehab PT Goals Patient Stated Goal: To go home PT Goal Formulation: With patient Time For Goal Achievement: 07/04/19 Potential to Achieve Goals: Good Additional Goals Additional Goal #1: Pt will mobilize in manual wheelchair for 123ft with supervision.    Frequency Min 4X/week   Barriers to discharge        Co-evaluation PT/OT/SLP Co-Evaluation/Treatment: Yes Reason for Co-Treatment: Necessary to address cognition/behavior during functional activity PT goals addressed during session: Mobility/safety with mobility;Balance;Strengthening/ROM         AM-PAC PT "6 Clicks" Mobility  Outcome Measure Help needed turning from your back to your side while in a flat bed without using bedrails?: None Help needed moving from lying on your back to sitting on the side of a flat bed without using bedrails?: None Help needed moving to and from a bed to a chair (including a wheelchair)?: A Little Help needed standing up from a chair using your arms (e.g., wheelchair or bedside chair)?: Total Help needed to walk in hospital room?: Total Help needed climbing 3-5 steps with a railing? : Total 6 Click Score: 14    End of Session Equipment Utilized During Treatment: Gait belt Activity Tolerance: Patient tolerated treatment well Patient left: in chair;with call bell/phone within reach;with chair alarm set Nurse Communication: Mobility status PT Visit Diagnosis: Other symptoms and signs involving the nervous system (R29.898)    Time: 1045-1106 PT Time Calculation (min) (ACUTE ONLY): 21 min   Charges:   PT Evaluation $PT Eval Low Complexity: 1 Low          Arlyss Gandy, PT, DPT Acute Rehabilitation Pager: [phone]   Arlyss Gandy 06/20/2019, 11:23 AM

## 2019-06-20 NOTE — Procedures (Signed)
Patient Name: DENELDA AKERLEY  MRN: [id number]  Epilepsy Attending: Lora Havens  Referring Physician/Provider: Dr Laurey Morale Date: 06/20/2019 Duration: 22.22 mins  Patient history: 85yo F with ams. EEG to evaluate for seizures.  Level of alertness: awake/  AEDs during EEG study:   Technical aspects: This EEG study was done with scalp electrodes positioned according to the 10-20 International system of electrode placement. Electrical activity was acquired at a sampling rate of 500Hz  and reviewed with a high frequency filter of 70Hz  and a low frequency filter of 1Hz . EEG data were recorded continuously and digitally stored.   DESCRIPTION:  The posterior dominant rhythm consists of 8 Hz activity of moderate voltage (25-35 uV) seen predominantly in posterior head regions, symmetric and reactive to eye opening and eye closing.  Intermittent 2 to 3 Hz delta slowing was seen in the right posterior temporal region.  Hyperventilation and photic stimulation were not performed.  Abnormality -Intermittent slow, right posterior temporal  IMPRESSION: This study is suggestive of cortical dysfunction in the right posterior temporal region, likely secondary to underlying infarct. No seizures or epileptiform discharges were seen throughout the recording.  Jazmin Ley Barbra Sarks

## 2019-06-20 NOTE — Evaluation (Addendum)
Occupational Therapy Evaluation Patient Details Name: Carrie Best MRN: [id number] DOB: 16-Apr-1928 Today's Date: 06/20/2019    History of Present Illness Carrie Best is a 85 y.o. 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  with history of atrial fibrillation, diastolic dysfunction, chronic kidney disease stage II, chronically wheelchair bound.   Clinical Impression   Pt PTA: pt performing ADL functional mobility (transfers- stand pivot) to w/c; ADL tasks with assist. Pt limited by poor cognition in awareness of deficits and confusion with situation, decreased mobility and decreased ability to care for self safely. Pt minA for stand pivot transfer to chair and set-upA for UB ADL and modA for LB ADL.  Pt requiring increased assist and family unable to properly care for pt. OT signing off acutely. Pt would benefit from continued OT skilled services in post acute care setting. OT following acutely.    Follow Up Recommendations  SNF;Supervision/Assistance - 24 hour    Equipment Recommendations  None recommended by OT    Recommendations for Other Services       Precautions / Restrictions Precautions Precautions: Fall Restrictions Weight Bearing Restrictions: No      Mobility Bed Mobility Overal bed mobility: Independent             General bed mobility comments: HOB raised  Transfers Overall transfer level: Needs assistance Equipment used: 1 person hand held assist Transfers: Squat Pivot Transfers     Squat pivot transfers: Min assist     General transfer comment: pt unable to stand 2/2 R knee extension deficits    Balance Overall balance assessment: Modified Independent                                         ADL either performed or assessed with clinical judgement   ADL Overall ADL's : Needs assistance/impaired Eating/Feeding: Set up;Sitting   Grooming: Set up;Sitting   Upper Body Bathing: Set  up;Sitting   Lower Body Bathing: Moderate assistance;Sitting/lateral leans;Sit to/from stand   Upper Body Dressing : Set up;Cueing for safety;Cueing for sequencing;Sitting   Lower Body Dressing: Moderate assistance;Sitting/lateral leans;Sit to/from stand;Cueing for safety   Toilet Transfer: Minimal assistance;Cueing for safety   Toileting- Clothing Manipulation and Hygiene: Moderate assistance;Cueing for safety;Sitting/lateral lean;Sit to/from stand       Functional mobility during ADLs: Minimal assistance General ADL Comments: pt limtited by poor cognition in awareness of deficits and confusion with situation, decreased mobility and decreased ability to care for self safely.     Vision Baseline Vision/History: No visual deficits Vision Assessment?: No apparent visual deficits     Perception     Praxis      Pertinent Vitals/Pain Pain Assessment: No/denies pain     Hand Dominance Right   Extremity/Trunk Assessment Upper Extremity Assessment Upper Extremity Assessment: Generalized weakness   Lower Extremity Assessment RLE Deficits / Details: pt lacking ~20-30 degrees R knee extension (chronic) RLE Sensation: WNL   Cervical / Trunk Assessment Cervical / Trunk Assessment: Kyphotic   Communication Communication Communication: HOH   Cognition Arousal/Alertness: Awake/alert Behavior During Therapy: WFL for tasks assessed/performed Overall Cognitive Status: No family/caregiver present to determine baseline cognitive functioning                                 General Comments: following commands, oriented to  self, place, and situation. Believes it is 1981 but does know month and is close on date.   General Comments       Exercises     Shoulder Instructions      Home Living Family/patient expects to be discharged to:: Private residence Living Arrangements: Children(dtr susie, DIL debra) Available Help at Discharge: Family;Available 24  hours/day Type of Home: House Home Access: Ramped entrance     Home Layout: One level     Bathroom Shower/Tub: Tub/shower unit;Other (comment)   Bathroom Toilet: Handicapped height Bathroom Accessibility: Yes   Home Equipment: Grab bars - toilet;Wheelchair - manual;Bedside commode;Cane - single point      Lives With: Daughter    Prior Functioning/Environment Level of Independence: Needs assistance  Gait / Transfers Assistance Needed: Non ambulatory- expressing transfer +1 to w/c and propels herself ADL's / Homemaking Assistance Needed: Seated for sink bath            OT Problem List: Decreased activity tolerance;Impaired balance (sitting and/or standing)      OT Treatment/Interventions:      OT Goals(Current goals can be found in the care plan section) Acute Rehab OT Goals Patient Stated Goal: To go home OT Goal Formulation: With patient Time For Goal Achievement: 07/04/19 Potential to Achieve Goals: Fair  OT Frequency:     Barriers to D/C: Decreased caregiver support  family providing all care, but per chart, family is unable to provide 24/7 physical assist.       Co-evaluation PT/OT/SLP Co-Evaluation/Treatment: Yes Reason for Co-Treatment: Complexity of the patient's impairments (multi-system involvement)   OT goals addressed during session: ADL's and self-care      AM-PAC OT "6 Clicks" Daily Activity     Outcome Measure Help from another person eating meals?: None Help from another person taking care of personal grooming?: A Little Help from another person toileting, which includes using toliet, bedpan, or urinal?: A Lot Help from another person bathing (including washing, rinsing, drying)?: A Lot Help from another person to put on and taking off regular upper body clothing?: A Little Help from another person to put on and taking off regular lower body clothing?: A Lot 6 Click Score: 16   End of Session Equipment Utilized During Treatment: Gait  belt Nurse Communication: Mobility status  Activity Tolerance: Patient tolerated treatment well Patient left: in chair;with call bell/phone within reach;with chair alarm set  OT Visit Diagnosis: Unsteadiness on feet (R26.81);Muscle weakness (generalized) (M62.81)                Time: 3235-5732 OT Time Calculation (min): 38 min Charges:  OT General Charges $OT Visit: 1 Visit OT Evaluation $OT Eval Moderate Complexity: 1 Mod OT Treatments $Self Care/Home Management : 8-22 mins  Revonda Standard Cecil Cranker) Glendell Docker OTR/L Acute Rehabilitation Services Pager: [phone] Office: [phone]  Lonzo Cloud 06/20/2019, 4:21 PM

## 2019-06-20 NOTE — Progress Notes (Signed)
  Echocardiogram 2D Echocardiogram has been performed.  Carrie Best 06/20/2019, 3:12 PM

## 2019-06-20 NOTE — TOC Initial Note (Signed)
Transition of Care [facility]) - Initial/Assessment Note    Patient Details  Name: Carrie Best MRN: [id number] Date of Birth: [birth year]  Transition of [name]) CM/SW Contact:    Pollie Friar, RN Phone Number: 06/20/2019, 4:03 PM  Clinical Narrative:                 CM met with the patient and daughter at the bedside and to discuss d/c planning. Pt lives home alone and has family that rotates on a daily basis to assist her at home. Currently she has a couple family members that have been assisting that are not able. Pt unable to ambulate and be home alone.  CM also spoke to patients daughter in law on the phone and she asked about rehab prior to returning home to have the patient gain some strength. Pt and daughter at the bedside in agreement. MD updated.  FL2 completed and patient faxed out to [facility] the facility of their choice.  TOC following.   Expected Discharge Plan: Skilled Nursing Facility Barriers to Discharge: Continued Medical Work up, SNF Pending bed offer   Patient Goals and CMS Choice   CMS Medicare.gov Compare Post Acute Care list provided to:: Patient Represenative (must comment) Choice offered to / list presented to : Patient, Adult Children  Expected Discharge Plan and Services Expected Discharge Plan: [place] In-house Referral: Clinical Social Work Discharge Planning Services: CM Consult Post Acute Care Choice: [place] arrangements for the past 2 months: [place]                                      Prior Living Arrangements/Services Living arrangements for the past 2 months: Single Family Home Lives with:: Adult Children Patient language and need for interpreter reviewed:: Yes(no needs) Do you feel safe going back to the place where you live?: Yes      Need for Family Participation in Patient Care: Yes (Comment) Care giver support system in place?: No (comment) Current home services:  DME(wheelchair, walker, shower seat, cane) Criminal Activity/Legal Involvement Pertinent to Current Situation/Hospitalization: No - Comment as needed  Activities of Daily Living Home Assistive Devices/Equipment: Cane (specify quad or straight), Other (Comment), Wheelchair, Shower chair with back, Bedside commode/3-in-1, Hearing aid(walk-in shower, ramp entrance, manual wheelchair, bilateral hearing aids) ADL Screening (condition at time of admission) Patient's cognitive ability adequate to safely complete daily activities?: Yes Is the patient deaf or have difficulty hearing?: Yes(wears bilateral hearing aids) Does the patient have difficulty seeing, even when wearing glasses/contacts?: No Does the patient have difficulty concentrating, remembering, or making decisions?: Yes Patient able to express need for assistance with ADLs?: Yes Does the patient have difficulty dressing or bathing?: Yes Independently performs ADLs?: No Communication: Independent Dressing (OT): Needs assistance Is this a change from baseline?: Pre-admission baseline Grooming: Needs assistance Is this a change from baseline?: Pre-admission baseline Feeding: Needs assistance Is this a change from baseline?: Pre-admission baseline Bathing: Needs assistance Is this a change from baseline?: Pre-admission baseline Toileting: Needs assistance Is this a change from baseline?: Pre-admission baseline In/Out Bed: Needs assistance Is this a change from baseline?: Pre-admission baseline Walks in Home: Needs assistance Is this a change from baseline?: Pre-admission baseline Does the patient have difficulty walking or climbing stairs?: Yes Weakness of Legs: Both Weakness of Arms/Hands: None  Permission Sought/Granted  Emotional Assessment Appearance:: Appears stated age Attitude/Demeanor/Rapport: Engaged Affect (typically observed): Accepting Orientation: : Oriented to Self, Oriented to Place, Oriented  to Situation   Psych Involvement: No (comment)  Admission diagnosis:  Nausea [R11.0] Acute encephalopathy [G93.40] Altered mental status, unspecified altered mental status type [R41.82] Patient Active Problem List   Diagnosis Date Noted  . CVA (cerebral vascular accident) ([place]) 06/19/2019  . Acute encephalopathy 06/18/2019  . Acute respiratory failure with hypoxia ([place]) 09/10/2018  . Chronic kidney disease (CKD), stage II (mild) 09/10/2018  . Amyloidosis of bladder ([place]) 09/10/2018  . CHF exacerbation ([place]) 09/09/2018  . ARF (acute renal failure) ([place]) 07/10/2018  . Nausea & vomiting 07/09/2018  . Hypokalemia 12/25/2017  . Acute on chronic diastolic CHF (congestive heart failure) ([place]) 12/24/2017  . Failed total knee arthroplasty ([place]) 05/19/2016  . Rupture of right patellar tendon 05/19/2016  . H/O nonunion of fracture 03/16/2016  . Arthritis of knee 02/17/2015  . Primary osteoarthritis of right knee Valgus 02/14/2015  . Chronic diastolic CHF (congestive heart failure) ([place]) 02/06/2015  . Proximal humerus fracture 06/10/2014  . Obesity 03/18/2012  . HTN (hypertension) 03/18/2012  . GERD (gastroesophageal reflux disease) 03/18/2012  . Hyperlipidemia 03/18/2012  . Sleep disturbance 03/18/2012  . Atrial fibrillation ([place]) 03/18/2012   PCP:  [name], MD Pharmacy:   [facility] [phone] [address], [address] AT [facility] [address] [place] [address] Phone: [phone] Fax: [phone]     Social Determinants of Health ([place]) Interventions    Readmission Risk Interventions No flowsheet data found.

## 2019-06-20 NOTE — Evaluation (Signed)
Speech Language Pathology Evaluation Patient Details Name: Carrie Best MRN: [id number] DOB: 1927-11-27 Today's Date: 06/20/2019 Time: 0900-0930 SLP Time Calculation (min) (ACUTE ONLY): 30 min  Problem List:  Patient Active Problem List   Diagnosis Date Noted  . CVA (cerebral vascular accident) ([place]) 06/19/2019  . Acute encephalopathy 06/18/2019  . Acute respiratory failure with hypoxia ([place]) 09/10/2018  . Chronic kidney disease (CKD), stage II (mild) 09/10/2018  . Amyloidosis of bladder ([place]) 09/10/2018  . CHF exacerbation ([place]) 09/09/2018  . ARF (acute renal failure) ([place]) 07/10/2018  . Nausea & vomiting 07/09/2018  . Hypokalemia 12/25/2017  . Acute on chronic diastolic CHF (congestive heart failure) ([place]) 12/24/2017  . Failed total knee arthroplasty ([place]) 05/19/2016  . Rupture of right patellar tendon 05/19/2016  . H/O nonunion of fracture 03/16/2016  . Arthritis of knee 02/17/2015  . Primary osteoarthritis of right knee Valgus 02/14/2015  . Chronic diastolic CHF (congestive heart failure) ([place]) 02/06/2015  . Proximal humerus fracture 06/10/2014  . Obesity 03/18/2012  . HTN (hypertension) 03/18/2012  . GERD (gastroesophageal reflux disease) 03/18/2012  . Hyperlipidemia 03/18/2012  . Sleep disturbance 03/18/2012  . Atrial fibrillation ([place]) 03/18/2012   Past Medical History:  Past Medical History:  Diagnosis Date  . Anxiety   . Arthritis   . Congestive heart failure (HCC)    Congestive heart failure symptoms  . Depression   . Diastolic dysfunction   . Dyspnea    occasionally, with exertion  . Dysrhythmia 05-10-12   hx. A. Fib., rate is controlled-tx. Xarelto  . GERD (gastroesophageal reflux disease) 05-10-12   tx. with meds as needed  . Hearing loss 05-10-12   bilateral hearing aids.  . Heart murmur   . History of kidney stones   . Hyperlipidemia   . Hypertension   . Pneumonia 05-10-12   dx. in July- tx. antibiotics  . Stroke ([place]) 05-10-12   Lt.CVA note on  scans-hx. freq. falls/ none past 6 months 01/21/14  . Wears hearing aid    bilateral   Past Surgical History:  Past Surgical History:  Procedure Laterality Date  . APPENDECTOMY    . CATARACT EXTRACTION, BILATERAL  05-10-12   bilateral  . CYSTOSCOPY WITH RETROGRADE PYELOGRAM, URETEROSCOPY AND STENT PLACEMENT Right 01/31/2014   Procedure: CYSTOSCOPY WITH bilateral  RETROGRADE PYELOGRAM, right URETEROSCOPY AND right  STENT PLACEMENT, bladder biopsy with fulgeration;  Surgeon: Alexis Frock, MD;  Location: [facility];  Service: Urology;  Laterality: Right;  . HARDWARE REMOVAL Right 09/09/2014   Procedure: HARDWARE REMOVAL RIGHT SHOULDER;  Surgeon: Nita Sells, MD;  Location: [facility];  Service: Orthopedics;  Laterality: Right;  Right shoulder hardware removal  . HOLMIUM LASER APPLICATION Right 11/27/5571   Procedure: HOLMIUM LASER APPLICATION;  Surgeon: Alexis Frock, MD;  Location: [facility];  Service: Urology;  Laterality: Right;  . HUMERUS IM NAIL Right 06/10/2014   Procedure: INTRAMEDULLARY (IM) NAIL HUMERAL;  Surgeon: Nita Sells, MD;  Location: [facility];  Service: Orthopedics;  Laterality: Right;  Right intramedullary humeral nail  . INTERSTIM IMPLANT PLACEMENT  05-10-12   now malfunctioning  . INTERSTIM IMPLANT REMOVAL  05/16/2012   Procedure: REMOVAL OF INTERSTIM IMPLANT;  Surgeon: Reece Packer, MD;  Location: [facility];  Service: Urology;  Laterality: N/A;  . JOINT REPLACEMENT    . OTHER SURGICAL HISTORY     Bladder Surgery  . SHOULDER SURGERY    . TOTAL KNEE ARTHROPLASTY Right 02/17/2015   Procedure: TOTAL KNEE ARTHROPLASTY;  Surgeon:  Gean Birchwood, MD;  Location: [facility];  Service: Orthopedics;  Laterality: Right;   HPI:  85 y.o. female brought to the ER after patient was found to be increasingly confused and lethargic over the last 24 hours. PMH: atrial fibrillation, diastolic dysfunction, chronic kidney disease stage II, chronically bedbound, GERD, PNA, HTN,  HLD, depression MRI = 2-3 cm acute infarction at the right parietooccipital junction. Mild swelling but no mass effect, shift or hemorrhage. Background pattern of acute extensive atrophy and chronic small-vessel ischemic changes elsewhere throughout the brain   Assessment / Plan / Recommendation Clinical Impression  The Mini-Mental State Exam (MMSE) was administered. Pt scored 23/30, indicating mild cognitive impairment. Pt had difficulty with orientation, delayed recall, attention (spelling world backwards), writing and clock drawing. These deficits raise concern for pt safety. Recommend continued ST intervention at next level of care, and would encourage 24 hour close supervision at DC. Pt reports she has 24 hour assistance, and her daughter manages finances and medication administration. Caregivers manage cooking and cleaning tasks.     SLP Assessment  SLP Recommendation/Assessment: All further Speech Language Pathology  needs can be addressed in the next venue of care SLP Visit Diagnosis: Cognitive communication deficit (R41.841)    Follow Up Recommendations  24 hour supervision/assistance       SLP Evaluation Cognition  Overall Cognitive Status: No family/caregiver present to determine baseline cognitive functioning Arousal/Alertness: Awake/alert Orientation Level: Oriented to person;Oriented to place;Oriented to situation;Disoriented to time Memory: Impaired Memory Impairment: Retrieval deficit;Decreased recall of new information;Decreased short term memory Decreased Short Term Memory: Verbal basic       Comprehension  Auditory Comprehension Overall Auditory Comprehension: Appears within functional limits for tasks assessed Interfering Components: Hearing    Expression Expression Primary Mode of Expression: Verbal Verbal Expression Overall Verbal Expression: Appears within functional limits for tasks assessed   Oral / Motor  Oral Motor/Sensory Function Overall Oral  Motor/Sensory Function: Within functional limits Motor Speech Overall Motor Speech: Appears within functional limits for tasks assessed   GO                   Celia B. Murvin Natal, [facility], CCC-SLP Speech Language Pathologist Office: [phone] Pager: [phone]  Leigh Aurora 06/20/2019, 9:32 AM

## 2019-06-21 LAB — ECHOCARDIOGRAM COMPLETE

## 2019-06-21 NOTE — Progress Notes (Signed)
Family asking to appeal denial for SNF rehab. CM called and started the process. Reference #: [id number] They say can take up 72 hours to hear decision. TOC following.

## 2019-06-21 NOTE — TOC Progression Note (Addendum)
Transition of Care [facility]) - Progression Note    Patient Details  Name: Carrie Best MRN: [id number] Date of Birth: Sep 30, 1927  [facility]) CM/SW Contact  Pollie Friar, RN Phone Number: 06/21/2019, 2:47 PM  Clinical Narrative:    [facility] medicare has asked for a Peer to Peer for admission to SNF. Dr [place] Crane doesn't feel that she has the rehab need to do a peer to peer. CM informed family of denial.  Plan will be for the family to take her home with [facility] services when she is ready for d/c.  TOC following.  1530 Addendum: [facility] accepted for [facility] services.   Expected Discharge Plan: [place] Barriers to Discharge: Continued Medical Work up, SNF Pending bed offer  Expected Discharge Plan and Services Expected Discharge Plan: [place] In-house Referral: Clinical Social Work Discharge Planning Services: CM Consult Post Acute Care Choice: [place] arrangements for the past 2 months: Single Family Home                                       Social Determinants of Health (SDOH) Interventions    Readmission Risk Interventions No flowsheet data found.

## 2019-06-21 NOTE — Progress Notes (Signed)
PROGRESS NOTE  Carrie Best XBM:[id number] DOB: 12-20-1927 DOA: 06/18/2019 PCP: Kirby FunkGriffin, John, MD  HPI/Recap of past 24 hours: Carrie DikeFrances A Best is a 85 y.o. female with history of atrial fibrillation, diastolic dysfunction, chronic kidney disease stage II, chronically bedbound was brought to the ER after patient was found to be increasingly confused and lethargic over the last 24 hours.  Patient's daughter provided the history.  As per the patient's daughter the night before last patient started become increasingly confused which continued since yesterday morning.  Patient became more lethargic and moved her bowels in a diaper which usually does not do.  Given the ongoing confusion patient was brought to the ER.  ED Course: In the ER patient was initially only oriented to name and gradually started following commands.  CT head shows age indeterminant cerebellar infarct.  ER physician discussed with on-call neurologist Dr. Amada JupiterKirkpatrick who advised to get MRI brain but to keep patient addressing and also make sure there is no infectious process.  At the time of my exam patient is alert awake oriented to name moving all extremities.  Labs show WBC 11.2 creatinine 1.2 sodium 134 COVID-19 negative.  UA unremarkable.  EKG shows A. fib. With LBBB rate control.  Chest x-ray does not show anything acute.  06/21/19: Patient was seen and examined at her bedside this morning.  She has no new complaints.  No acute events overnight.  She is alert and oriented x3.  Denies any headaches dizziness or change in her vision.  Acute right parietal occipital CVA.  Neurology stroke team made aware and will see the patient in consultation.  Patient has a history of permanent A. fib, previously on Xarelto, wheelchair bound due to knee issues, unclear why oral anticoagulation was stopped.  Assessment/Plan: Principal Problem:   Acute encephalopathy Active Problems:   HTN (hypertension)   Atrial fibrillation (HCC)    Chronic diastolic CHF (congestive heart failure) (HCC)   Chronic kidney disease (CKD), stage II (mild)   CVA (cerebral vascular accident) (HCC)   Acute ischemic CVA, right parieto-occipital Seen on MRI brain History of permanent A. fib previously on Xarelto, unclear why it was stopped.  Also wheelchair-bound due to knee issues. Per medical records, last seen normal on 06/16/19  Admitted for stroke work-up LDL 66 goal less than 70 A1c 5.9 goal less than 7.0 2D echo pending Currently on Lipitor to 80 mg daily Currently on ASA 325 mg Goal BP normotensive gradually Resume diltiazem and Imdur on 06/21/19 Continue beta-blocker for rate control PT OT/speech therapist Will be seen by neurology stroke team, defer to stroke team and cardiology for decision regarding anticoagulation.  Permanent A. fib, not on oral anticoagulation Followed by Dr. Elease HashimotoNahser Was previously on Xarelto, unclear why it was stopped Chads Vascor 7 Rate controlled Continue p.o. Cardizem and p.o. Lopressor Obtain routine twelve-lead EKG this morning  Essential hypertension Blood pressure is normotensive Resume diltiazem and Imdur on 06/21/19 Currently on beta-blocker for rate control  Chronic diastolic CHF Continue to hold off torsemide for now Strict I's and O's and daily weight Monitor volume status  Leukocytosis Presented with mild leukocytosis WBC 11.2 Blood cultures x2 obtained on 06/19/19 in process  Ambulatory dysfunction/wheelchair-bound PT OT to assess Fall precautions  DVT prophylaxis: Lovenox sq daily Code Status: DNR  Family Communication: None at bedside Disposition Plan: Home pending PT OT assessment and clinical course Consults called: Neurology    Objective: Vitals:   06/20/19 2025 06/20/19 2332 06/21/19 0356 06/21/19  0915  BP: (!) 153/78 (!) 157/70 (!) 105/59 (!) (P) 162/79  Pulse: 81 74 (!) 57 (P) 65  Resp: 16 16 16  (P) 18  Temp: 98.5 F (36.9 C) 97.8 F (36.6 C) 98.7 F (37.1  C) (P) 98.6 F (37 C)  TempSrc: Oral Oral Oral (P) Oral  SpO2: 96% 98% 93% (P) 95%    Intake/Output Summary (Last 24 hours) at 06/21/2019 0945 Last data filed at 06/21/2019 0515 Gross per 24 hour  Intake -  Output 1500 ml  Net -1500 ml   There were no vitals filed for this visit.  Exam:  . General: 85 y.o. year-old female well-developed well-nourished no acute distress.  Alert and oriented x3. . Cardiovascular: Irregular rate and rhythm no rubs or gallops no JVD or thyromegaly noted. Marland Kitchen Respiratory: Clear to auscultation no wheezes or rales.  Poor respiratory effort.   . Abdomen: Obese nontender nondistended bowel sounds present.  . Musculoskeletal: Trace lower extremity edema.  Peripheral pulses in all 4 extremities. Marland Kitchen Psychiatry: Mood is appropriate for condition and setting.   Data Reviewed: CBC: Recent Labs  Lab 06/18/19 1713 06/19/19 0300 06/19/19 0455  WBC 11.2* 9.2 9.2  HGB 13.0 12.8 12.9  HCT 37.7 37.7 38.0  MCV 89.8 90.0 90.3  PLT 170 164 161   Basic Metabolic Panel: Recent Labs  Lab 06/18/19 1713 06/19/19 0300 06/19/19 0455  NA 134*  --  136  K 3.8  --  3.6  CL 100  --  103  CO2 23  --  25  GLUCOSE 145*  --  119*  BUN 14  --  16  CREATININE 1.22* 1.12* 1.14*  CALCIUM 10.0  --  10.1   GFR: CrCl cannot be calculated (Unknown ideal weight.). Liver Function Tests: Recent Labs  Lab 06/18/19 1713  AST 26  ALT 16  ALKPHOS 108  BILITOT 1.1  PROT 7.1  ALBUMIN 3.8   No results for input(s): LIPASE, AMYLASE in the last 168 hours. Recent Labs  Lab 06/19/19 0547  AMMONIA 37*   Coagulation Profile: No results for input(s): INR, PROTIME in the last 168 hours. Cardiac Enzymes: No results for input(s): CKTOTAL, CKMB, CKMBINDEX, TROPONINI in the last 168 hours. BNP (last 3 results) No results for input(s): PROBNP in the last 8760 hours. HbA1C: Recent Labs    06/20/19 0559  HGBA1C 5.9*   CBG: Recent Labs  Lab 06/18/19 1711  GLUCAP 143*    Lipid Profile: Recent Labs    06/20/19 0559  CHOL 126  HDL 45  LDLCALC 66  TRIG 75  CHOLHDL 2.8   Thyroid Function Tests: No results for input(s): TSH, T4TOTAL, FREET4, T3FREE, THYROIDAB in the last 72 hours. Anemia Panel: No results for input(s): VITAMINB12, FOLATE, FERRITIN, TIBC, IRON, RETICCTPCT in the last 72 hours. Urine analysis:    Component Value Date/Time   COLORURINE YELLOW 06/18/2019 2019   APPEARANCEUR CLEAR 06/18/2019 2019   LABSPEC 1.012 06/18/2019 2019   PHURINE 7.0 06/18/2019 2019   GLUCOSEU NEGATIVE 06/18/2019 2019   HGBUR NEGATIVE 06/18/2019 2019   BILIRUBINUR NEGATIVE 06/18/2019 2019   KETONESUR NEGATIVE 06/18/2019 2019   PROTEINUR NEGATIVE 06/18/2019 2019   UROBILINOGEN 0.2 02/07/2015 1534   NITRITE NEGATIVE 06/18/2019 2019   LEUKOCYTESUR NEGATIVE 06/18/2019 2019   Sepsis Labs: @LABRCNTIP (procalcitonin:4,lacticidven:4)  ) Recent Results (from the past 240 hour(s))  SARS CORONAVIRUS 2 (TAT 6-24 HRS) Nasopharyngeal Nasopharyngeal Swab     Status: None   Collection Time: 06/18/19  5:45 PM  Specimen: Nasopharyngeal Swab  Result Value Ref Range Status   SARS Coronavirus 2 NEGATIVE NEGATIVE Final    Comment: (NOTE) SARS-CoV-2 target nucleic acids are NOT DETECTED. The SARS-CoV-2 RNA is generally detectable in upper and lower respiratory specimens during the acute phase of infection. Negative results do not preclude SARS-CoV-2 infection, do not rule out co-infections with other pathogens, and should not be used as the sole basis for treatment or other patient management decisions. Negative results must be combined with clinical observations, patient history, and epidemiological information. The expected result is Negative. Fact Sheet for Patients: HairSlick.no Fact Sheet for Healthcare Providers: quierodirigir.com This test is not yet approved or cleared by the [place] FDA and  has  been authorized for detection and/or diagnosis of SARS-CoV-2 by FDA under an Emergency Use Authorization (EUA). This EUA will remain  in effect (meaning this test can be used) for the duration of the COVID-19 declaration under Section 56 4(b)(1) of the Act, 21 U.S.C. section 360bbb-3(b)(1), unless the authorization is terminated or revoked sooner. Performed at [facility], [address], [place], [place] [address]   Culture, blood (routine x 2)     Status: None (Preliminary result)   Collection Time: 06/19/19  1:25 AM   Specimen: BLOOD  Result Value Ref Range Status   Specimen Description   Final    BLOOD BLOOD LEFT WRIST Performed at [facility], [address] [place], [place] [address]    Special Requests   Final    BOTTLES DRAWN AEROBIC AND ANAEROBIC Blood Culture results may not be optimal due to an inadequate volume of blood received in culture bottles Performed at [facility], [address], [place], [place] [address]    Culture   Final    NO GROWTH 2 DAYS Performed at [facility], [address] [place], [place] [address]    Report Status PENDING  Incomplete  Culture, blood (routine x 2)     Status: None (Preliminary result)   Collection Time: 06/19/19  1:25 AM   Specimen: BLOOD  Result Value Ref Range Status   Specimen Description   Final    BLOOD BLOOD LEFT HAND Performed at [facility], [address] [place], [place] [address]    Special Requests   Final    BOTTLES DRAWN AEROBIC AND ANAEROBIC Blood Culture results may not be optimal due to an inadequate volume of blood received in culture bottles Performed at [facility], [address], [place], [place] [address]    Culture   Final    NO GROWTH 2 DAYS Performed at [facility], [address], [place], [place] [address]    Report Status PENDING  Incomplete      Studies: No results found.  Scheduled  Meds: .  stroke: mapping our early stages of recovery book   Does not apply Once  . aspirin  325 mg Oral Daily  . atorvastatin  80 mg Oral Daily  . diltiazem  180 mg Oral Daily  . DULoxetine  60 mg Oral Daily  . enoxaparin (LOVENOX) injection  40 mg Subcutaneous Q24H  . isosorbide mononitrate  30 mg Oral Daily  . metoprolol tartrate  50 mg Oral BID  . sodium chloride flush  3 mL Intravenous Once    Continuous Infusions: . sodium chloride 75 mL/hr at 06/20/19 0024     LOS: 2 days     Oliver Pila  Margo Aye, MD [facility] Pager [phone]  If 7PM-7AM, please contact night-coverage [email or web address] Password [email or web address] 06/21/2019, 9:45 AM

## 2019-06-21 NOTE — Progress Notes (Signed)
Physical Therapy Treatment Patient Details Name: Carrie Best MRN: [id number] DOB: Mar 02, 1928 Today's Date: 06/21/2019    History of Present Illness Carrie Best is a 85 y.o. 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  with history of atrial fibrillation, diastolic dysfunction, chronic kidney disease stage II, chronically wheelchair bound.    PT Comments    Pt tolerated treatment well, demonstrating the ability to propel manual wheelchair with BLE for community distances, only requiring PT verbal cueing for safety awareness. Pt continues to require physical support for transfers, however this may be her baseline. Pt will continue to benefit from PT POC to reduce falls risk and aide in a return to her prior level of function.   Follow Up Recommendations  SNF(no 24/7 care per case worker notes)     Equipment Recommendations  None recommended by PT    Recommendations for Other Services       Precautions / Restrictions Precautions Precautions: Fall Restrictions Weight Bearing Restrictions: No    Mobility  Bed Mobility Overal bed mobility: Independent             General bed mobility comments: HOB raised  Transfers Overall transfer level: Needs assistance Equipment used: None Transfers: Stand Pivot Transfers   Stand pivot transfers: Min assist Squat pivot transfers: Min assist     General transfer comment: limited standing tolerance 2/2 R knee extension deficits  Ambulation/Gait                 Psychologist, counselling mobility: Yes Wheelchair propulsion: Both lower extermities Wheelchair parts: Supervision/cueing Distance: 75 feet and 50 feet Wheelchair Assistance Details (indicate cue type and reason): PT providing verbal cueing for safety and management of W/C during turns  Modified Rankin (Stroke Patients Only) Modified Rankin (Stroke Patients  Only) Pre-Morbid Rankin Score: Moderately severe disability Modified Rankin: Moderately severe disability     Balance Overall balance assessment: Needs assistance Sitting-balance support: Feet supported Sitting balance-Leahy Scale: Good     Standing balance support: Bilateral upper extremity supported Standing balance-Leahy Scale: Poor Standing balance comment: minA for standing balance with hand hold and support of recliner                            Cognition Arousal/Alertness: Awake/alert Behavior During Therapy: WFL for tasks assessed/performed Overall Cognitive Status: No family/caregiver present to determine baseline cognitive functioning                                 General Comments: forgetful of situation and place at times during session      Exercises      General Comments        Pertinent Vitals/Pain Pain Assessment: No/denies pain    Home Living                      Prior Function            PT Goals (current goals can now be found in the care plan section) Acute Rehab PT Goals Patient Stated Goal: To go home Progress towards PT goals: Progressing toward goals    Frequency    Min 2X/week      PT Plan Discharge plan needs to be updated;Frequency needs to be updated  Co-evaluation              AM-PAC PT "6 Clicks" Mobility   Outcome Measure  Help needed turning from your back to your side while in a flat bed without using bedrails?: None Help needed moving from lying on your back to sitting on the side of a flat bed without using bedrails?: None Help needed moving to and from a bed to a chair (including a wheelchair)?: A Little Help needed standing up from a chair using your arms (e.g., wheelchair or bedside chair)?: A Little Help needed to walk in hospital room?: Total Help needed climbing 3-5 steps with a railing? : Total 6 Click Score: 16    End of Session Equipment Utilized During  Treatment: Gait belt Activity Tolerance: Patient tolerated treatment well Patient left: in chair;with call bell/phone within reach;with chair alarm set Nurse Communication: Mobility status PT Visit Diagnosis: Other symptoms and signs involving the nervous system (R29.898)     Time: 3888-2800 PT Time Calculation (min) (ACUTE ONLY): 33 min  Charges:  $Therapeutic Activity: 8-22 mins $Wheel Chair Management: 8-22 mins                     Zenaida Niece, PT, DPT Acute Rehabilitation Pager: [phone]    Zenaida Niece 06/21/2019, 10:56 AM

## 2019-06-21 NOTE — Consult Note (Addendum)
Neurology Consultation  Reason for Consult: Stroke Referring Physician: Dr. [place] Crane  History is obtained from: Granddaughter who is in the room  HPI: Carrie Best is a 85 y.o. female with history of hearing loss, previous stroke, hypertension, hyperlipidemia, heart murmur, dysrhythmia-atrial fibrillation rate controlled and not on anticoagulation due to fall risk, diastolic dysfunction, depression, congestive heart failure.  Per granddaughter on Sunday she was noted to be acting very differently.  She usually is a person who is very talkative and interactive.  She suddenly became less interactive and talkative.  She was also noted to have a headache and be holding her head.  Per chart she also was more lethargic and had fecal incontinence which is not usual.  Due to ongoing confusion patient was brought to the ED.    Work up that has been done: MRI brain, CT brain, CTA head and neck, hemoglobin A1c, lipid panel, EEG and echocardiogram    LKW: Unclear tpa given?: no, no last known normal and out of window Premorbid modified Rankin scale (mRS): 4 NIH stroke score 1  ROS: A 14 point ROS was performed and is negative except as noted in the HPI.   Past Medical History:  Diagnosis Date  . Anxiety   . Arthritis   . Congestive heart failure (HCC)    Congestive heart failure symptoms  . Depression   . Diastolic dysfunction   . Dyspnea    occasionally, with exertion  . Dysrhythmia 05-10-12   hx. A. Fib., rate is controlled-tx. Xarelto  . GERD (gastroesophageal reflux disease) 05-10-12   tx. with meds as needed  . Hearing loss 05-10-12   bilateral hearing aids.  . Heart murmur   . History of kidney stones   . Hyperlipidemia   . Hypertension   . Pneumonia 05-10-12   dx. in July- tx. antibiotics  . Stroke ([place]) 05-10-12   Lt.CVA note on scans-hx. freq. falls/ none past 6 months 01/21/14  . Wears hearing aid    bilateral     Family History  Problem Relation Age of Onset  . Stroke Mother    . Stroke Brother    Social History:   reports that she has never smoked. She has never used smokeless tobacco. She reports that she does not drink alcohol or use drugs.  Medications  Current Facility-Administered Medications:  .   stroke: mapping our early stages of recovery book, , Does not apply, Once, Sheikh, Omair Latif, DO .  acetaminophen (TYLENOL) tablet 650 mg, 650 mg, Oral, Q6H PRN **OR** acetaminophen (TYLENOL) suppository 650 mg, 650 mg, Rectal, Q6H PRN, Rise Patience, MD .  aspirin tablet 325 mg, 325 mg, Oral, Daily, Sheikh, Omair Latif, DO, 325 mg at 06/21/19 1029 .  atorvastatin (LIPITOR) tablet 80 mg, 80 mg, Oral, Daily, Hall, Carole N, DO, 80 mg at 06/21/19 1030 .  diltiazem (CARDIZEM CD) 24 hr capsule 180 mg, 180 mg, Oral, Daily, Hall, Carole N, DO, 180 mg at 06/21/19 1029 .  DULoxetine (CYMBALTA) DR capsule 60 mg, 60 mg, Oral, Daily, Rise Patience, MD, 60 mg at 06/21/19 1029 .  enoxaparin (LOVENOX) injection 40 mg, 40 mg, Subcutaneous, Q24H, Rise Patience, MD, 40 mg at 06/21/19 1029 .  isosorbide mononitrate (IMDUR) 24 hr tablet 30 mg, 30 mg, Oral, Daily, Hall, Carole N, DO, 30 mg at 06/21/19 1030 .  metoprolol tartrate (LOPRESSOR) tablet 50 mg, 50 mg, Oral, BID, Rise Patience, MD, 50 mg at 06/21/19 1029 .  ondansetron (  ZOFRAN) tablet 4 mg, 4 mg, Oral, Q6H PRN **OR** ondansetron (ZOFRAN) injection 4 mg, 4 mg, Intravenous, Q6H PRN, Eduard ClosKakrakandy, Arshad N, MD, 4 mg at 06/19/19 1341 .  promethazine (PHENERGAN) injection 12.5 mg, 12.5 mg, Intravenous, Q6H PRN, Marguerita MerlesSheikh, Omair Latif, DO, 12.5 mg at 06/20/19 2125 .  sodium chloride flush (NS) 0.9 % injection 3 mL, 3 mL, Intravenous, Once, Eduard ClosKakrakandy, Arshad N, MD, Stopped at 06/19/19 0201   Exam: Current vital signs: BP 124/70 (BP Location: Right Arm)   Pulse 61   Temp 98.7 F (37.1 C) (Oral)   Resp 18   SpO2 96%  Vital signs in last 24 hours: Temp:  [97.8 F (36.6 C)-98.7 F (37.1 C)] 98.7 F  (37.1 C) (10/15 1220) Pulse Rate:  [57-81] 61 (10/15 1220) Resp:  [16-18] 18 (10/15 1220) BP: (105-162)/(59-79) 124/70 (10/15 1220) SpO2:  [93 %-98 %] 96 % (10/15 1220)  Physical Exam  Constitutional: Appears well-developed and well-nourished.  Psych: Affect appropriate to situation Eyes: No scleral injection HENT: No OP obstrucion Head: Normocephalic.  Cardiovascular: Normal rate and regular rhythm.  Respiratory: Effort normal, non-labored breathing GI: Soft.  No distension. There is no tenderness.  Skin: WDI  Neuro: Mental Status: Patient is awake, alert, oriented to person, place, Patient is not able to give a clear and coherent history. No signs of aphasia or neglect Cranial Nerves: II: Visual Fields are full.  III,IV, VI: EOMI without ptosis or diploplia. Pupils equal, round and reactive to light V: Facial sensation is symmetric to temperature VII: Facial movement is symmetric.  VIII: hearing is intact to voice X: Palat elevates symmetrically XI: Shoulder shrug is symmetric. XII: tongue is midline without atrophy or fasciculations.  Motor: Tone is normal. Bulk is normal. 5/5 strength was present in all extremities except for her right leg twitch she had knee surgery and has limited range of motion and strength.  She cannot walk on that leg secondary to this. Sensory: Sensation is symmetric to light touch and temperature in the arms and legs Deep Tendon Reflexes: 2+ and symmetric in the biceps left patella but not right secondary to surgery Plantars: Upgoing bilaterally Cerebellar: FNF within normal limits is heel-knee-shin is within normal limits on the left but cannot participate on the right  Labs I have reviewed labs in epic and the results pertinent to this consultation are:   CBC    Component Value Date/Time   WBC 9.2 06/19/2019 0455   RBC 4.21 06/19/2019 0455   HGB 12.9 06/19/2019 0455   HCT 38.0 06/19/2019 0455   PLT 166 06/19/2019 0455   MCV 90.3  06/19/2019 0455   MCH 30.6 06/19/2019 0455   MCHC 33.9 06/19/2019 0455   RDW 13.8 06/19/2019 0455   LYMPHSABS 1.8 09/09/2018 1216   MONOABS 0.7 09/09/2018 1216   EOSABS 0.3 09/09/2018 1216   BASOSABS 0.0 09/09/2018 1216    CMP     Component Value Date/Time   NA 136 06/19/2019 0455   NA 139 10/24/2018 1519   K 3.6 06/19/2019 0455   CL 103 06/19/2019 0455   CO2 25 06/19/2019 0455   GLUCOSE 119 (H) 06/19/2019 0455   BUN 16 06/19/2019 0455   BUN 15 10/24/2018 1519   CREATININE 1.14 (H) 06/19/2019 0455   CALCIUM 10.1 06/19/2019 0455   PROT 7.1 06/18/2019 1713   ALBUMIN 3.8 06/18/2019 1713   AST 26 06/18/2019 1713   ALT 16 06/18/2019 1713   ALKPHOS 108 06/18/2019 1713   BILITOT 1.1  06/18/2019 1713   GFRNONAA 43 (L) 06/19/2019 0455   GFRAA 49 (L) 06/19/2019 0455    Lipid Panel     Component Value Date/Time   CHOL 126 06/20/2019 0559   TRIG 75 06/20/2019 0559   HDL 45 06/20/2019 0559   CHOLHDL 2.8 06/20/2019 0559   VLDL 15 06/20/2019 0559   LDLCALC 66 06/20/2019 0559     Imaging I have reviewed the images obtained:   Felicie Morn PA-C [facility] [phone]  M-F  (9:00 am- 5:00 PM)  06/21/2019, 12:58 PM     CT-scan of the brain-age indeterminant right occipital infarct.   MRI examination of the brain-2-3 cm acute infarct at the right perioccipital junction without shift or hemorrhage  CTA of head and neck-negative for large vessel occlusion or stenosis.  Positive for short segment high-grade stenosis of inferior right PCA P3 division, but appears unrelated to recent superior right occipital pole infarct.  Mild for age carotid atherosclerosis.  Normal vertebral arteries and basilar artery.  LDL 66 A1c 5.9  Echocardiogram-left ventricular ejection fraction is 55 to 60% with left ventricular mild decrease function.  Normal left ventricular size.  EEG shows no seizure or epileptiform discharges.   NEUROHOSPITALIST ADDENDUM Performed a face to  face diagnostic evaluation.   I have reviewed the contents of history and physical exam as documented by PA/ARNP/Resident and agree with above documentation.  I have discussed and formulated the above plan as documented. Edits to the note have been made as needed.  85 year old female with past medical history significant for atrial fibrillation not on anticoagulation (previously on Xarelto but unclear why this was stopped , poosiblydiscontinued apparently due to falls-however has been wheelchair-bound for some time).  She also has hypertension, chronic diastolic heart failure.  She presented to the emergency department on 10/12 with confusion and increased lethargy.  MRI brain was obtained on 10/13 which showed a right parietal occipital lobe infarct.  Neurology was consulted today after MRI brain results.  The patient is alert and oriented x3, has good strength in  bilateral upper extremities.  Right leg: Had knee surgery is unable to walk due to persistent limited range of motion.    Recommendations Resume anticoagulation, recommend Eliquis 5 mg twice daily ( would confirm dosing with pharmacy) Discuss with cardiology whether aspirin also needs to be continued ( for heart failure) Continue high-dose statin Blood pressure goal less than 140 systolic Follow up with PT/OT recommendations, will likely need placement/rehab due to acute ischemic stroke   Follow up with Dr Pearlean Brownie in stroke clinic in 2-4 weeks     Sushanth Aroor MD [facility] [phone]   If 7pm to 7am, please call on call as listed on AMION.

## 2019-06-21 NOTE — TOC Progression Note (Signed)
Transition of Care [facility]) - Progression Note    Patient Details  Name: Carrie Best MRN: [id number] Date of Birth: 27-Aug-1928  Transition of [facility] CM/SW Contact  Pollie Friar, RN Phone Number: 06/21/2019, 9:26 AM  Clinical Narrative:    Information faxed to [facility] for authorization for SNF rehab. TOC following.   Expected Discharge Plan: [place] Barriers to Discharge: Continued Medical Work up, SNF Pending bed offer  Expected Discharge Plan and Services Expected Discharge Plan: [place] In-house Referral: Clinical Social Work Discharge Planning Services: CM Consult Post Acute Care Choice: [place] arrangements for the past 2 months: Single Family Home                                       Social Determinants of Health (SDOH) Interventions    Readmission Risk Interventions No flowsheet data found.

## 2019-06-22 DIAGNOSIS — I4819 Other persistent atrial fibrillation: Secondary | ICD-10-CM

## 2019-06-22 DIAGNOSIS — I5032 Chronic diastolic (congestive) heart failure: Secondary | ICD-10-CM

## 2019-06-22 DIAGNOSIS — G9340 Encephalopathy, unspecified: Secondary | ICD-10-CM

## 2019-06-22 DIAGNOSIS — E785 Hyperlipidemia, unspecified: Secondary | ICD-10-CM

## 2019-06-22 MED ORDER — ATORVASTATIN CALCIUM 40 MG PO TABS
40.0000 mg | ORAL_TABLET | Freq: Every day | ORAL | Status: DC
  Administered 2019-06-23 – 2019-06-26 (×4): 40 mg via ORAL
  Filled 2019-06-22 (×4): qty 1

## 2019-06-22 MED ORDER — APIXABAN 5 MG PO TABS
5.0000 mg | ORAL_TABLET | Freq: Two times a day (BID) | ORAL | Status: DC
  Administered 2019-06-22 – 2019-06-26 (×9): 5 mg via ORAL
  Filled 2019-06-22 (×9): qty 1

## 2019-06-22 MED ORDER — ATORVASTATIN CALCIUM 10 MG PO TABS: 20.0000 mg | ORAL_TABLET | Freq: Every day | ORAL | Status: DC

## 2019-06-22 NOTE — Progress Notes (Addendum)
[name] for apixaban Indication: atrial fibrillation  Labs: No results for input(s): HGB, HCT, PLT, APTT, LABPROT, INR, HEPARINUNFRC, HEPRLOWMOCWT, CREATININE, CKTOTAL, CKMB, TROPONINI in the last 72 hours.  Assessment: 55 yof with hx of afib (previously on Xarelto - d/c'd around Fall 2018 per daughter due to fall risk) presenting with acute CVA. Pharmacy consulted to transition to apixaban. Currently on Lovenox ppx - last dose 10/16 at 1123 (already d/c'd). Last CBC wnl on 10/13, SCr 1.14. No active bleed issues documented.  Goal of Therapy:  Stroke prevention Monitor platelets by anticoagulation protocol: Yes   Plan:  Apixaban 5mg  PO BID Monitor CBC, s/sx bleeding  Carrie Best, PharmD, BCPS Clinical Pharmacist 06/22/2019 2:30 PM

## 2019-06-22 NOTE — Care Management Important Message (Signed)
Important Message  Patient Details  Name: Carrie Best MRN: [id number] Date of Birth: 05-26-1928   Medicare Important Message Given:        Carrie Best 06/22/2019, 2:05 PM

## 2019-06-22 NOTE — Consult Note (Addendum)
Cardiology Consultation:   Patient ID: Carrie Best MRN: [id number]; DOB: [birth year]  Admit date: 06/18/2019 Date of Consult: 06/22/2019  Primary Care Provider: Kirby FunkGriffin, John, MD Primary Cardiologist: Kristeen MissPhilip Nahser, MD   Patient Profile:   Carrie Best is a 85 y.o. female with a hx of persistent atrial fibrillation previously on Xarelto (unsure whey discontinued), chronic LBBB, HTN, HLD, chronic diastolic CHF, dementia, stroke, wheelchair bound due to chronic knee issue and GERD  who is being seen today for the evaluation of afib/anticoagulation at the request of Dr. Margo AyeHall.   Adenosine myoview was normal in 2010. Echo 03/2012 showed LVEF of 50-55%; grade 1 DD; mild MR, mild dilated LA, RV and RA. She was on Xarelto when seen by me 02/2017 for palpitations and preoperative cardiac clearance for right shoulder surgery. She was cleared after reassuring echo. Her Xarelto discontinued at unknown time without clear indications/documentation but seems due to fall risk however she was wheelchair bound prior.   History of Present Illness:   Carrie Best presented 10/13 with confusion and weakness.  CT head showed age indeterminant cerebellar infarct. MRI of brain showed acute right parieto-occipital ischemic CVA. Neurology recommended Eliquis (appropriate dose). Patient in atrial fibrillation at controlled rate. Patient was not on ASA prior to arrival however on 325mg  started on admission. K normal. Creatinine 1.14. LDL 75. Cardiology is asked for anticoagulation recommendations.   Echo 06/20/2019  1. Left ventricular ejection fraction, by visual estimation, is 55 to 60%. The left ventricle has mildly decreased function. Normal left ventricular size. There is no left ventricular hypertrophy.  2. Left ventricular diastolic function could not be evaluated pattern of LV diastolic filling.  3. Global right ventricle has normal systolic function.The right ventricular size is normal. No increase in right  ventricular wall thickness.  4. Left atrial size was moderately dilated.  5. Right atrial size was moderately dilated.  6. Presence of pericardial fat pad.  7. Small pericardial effusion.  8. The mitral valve is normal in structure. Mild mitral valve regurgitation. No evidence of mitral stenosis.  9. The tricuspid valve is normal in structure. Tricuspid valve regurgitation is mild. 10. The aortic valve is normal in structure. Aortic valve regurgitation was not visualized by color flow Doppler. Structurally normal aortic valve, with no evidence of sclerosis or stenosis. 11. The pulmonic valve was normal in structure. Pulmonic valve regurgitation is not visualized by color flow Doppler. 12. Moderately elevated pulmonary artery systolic pressure. 13. The inferior vena cava is dilated in size with <50% respiratory variability, suggesting right atrial pressure of 15 mmHg.  Heart Pathway Score:     Past Medical History:  Diagnosis Date  . Anxiety   . Arthritis   . Congestive heart failure (HCC)    Congestive heart failure symptoms  . Depression   . Diastolic dysfunction   . Dyspnea    occasionally, with exertion  . Dysrhythmia 05-10-12   hx. A. Fib., rate is controlled-tx. Xarelto  . GERD (gastroesophageal reflux disease) 05-10-12   tx. with meds as needed  . Hearing loss 05-10-12   bilateral hearing aids.  . Heart murmur   . History of kidney stones   . Hyperlipidemia   . Hypertension   . Pneumonia 05-10-12   dx. in July- tx. antibiotics  . Stroke (HCC) 05-10-12   Lt.CVA note on scans-hx. freq. falls/ none past 6 months 01/21/14  . Wears hearing aid    bilateral    Past Surgical History:  Procedure Laterality Date  .  APPENDECTOMY    . CATARACT EXTRACTION, BILATERAL  05-10-12   bilateral  . CYSTOSCOPY WITH RETROGRADE PYELOGRAM, URETEROSCOPY AND STENT PLACEMENT Right 01/31/2014   Procedure: CYSTOSCOPY WITH bilateral  RETROGRADE PYELOGRAM, right URETEROSCOPY AND right  STENT PLACEMENT,  bladder biopsy with fulgeration;  Surgeon: Sebastian Ache, MD;  Location: [facility];  Service: Urology;  Laterality: Right;  . HARDWARE REMOVAL Right 09/09/2014   Procedure: HARDWARE REMOVAL RIGHT SHOULDER;  Surgeon: Mable Paris, MD;  Location: [facility];  Service: Orthopedics;  Laterality: Right;  Right shoulder hardware removal  . HOLMIUM LASER APPLICATION Right 01/31/2014   Procedure: HOLMIUM LASER APPLICATION;  Surgeon: Sebastian Ache, MD;  Location: [facility];  Service: Urology;  Laterality: Right;  . HUMERUS IM NAIL Right 06/10/2014   Procedure: INTRAMEDULLARY (IM) NAIL HUMERAL;  Surgeon: Mable Paris, MD;  Location: [facility];  Service: Orthopedics;  Laterality: Right;  Right intramedullary humeral nail  . INTERSTIM IMPLANT PLACEMENT  05-10-12   now malfunctioning  . INTERSTIM IMPLANT REMOVAL  05/16/2012   Procedure: REMOVAL OF INTERSTIM IMPLANT;  Surgeon: Martina Sinner, MD;  Location: [facility];  Service: Urology;  Laterality: N/A;  . JOINT REPLACEMENT    . OTHER SURGICAL HISTORY     Bladder Surgery  . SHOULDER SURGERY    . TOTAL KNEE ARTHROPLASTY Right 02/17/2015   Procedure: TOTAL KNEE ARTHROPLASTY;  Surgeon: Gean Birchwood, MD;  Location: [facility];  Service: Orthopedics;  Laterality: Right;     Inpatient Medications: Scheduled Meds: .  stroke: mapping our early stages of recovery book   Does not apply Once  . aspirin  325 mg Oral Daily  . atorvastatin  80 mg Oral Daily  . diltiazem  180 mg Oral Daily  . DULoxetine  60 mg Oral Daily  . enoxaparin (LOVENOX) injection  40 mg Subcutaneous Q24H  . isosorbide mononitrate  30 mg Oral Daily  . metoprolol tartrate  50 mg Oral BID  . sodium chloride flush  3 mL Intravenous Once   Continuous Infusions:  PRN Meds: acetaminophen **OR** acetaminophen, ondansetron **OR** ondansetron (ZOFRAN) IV, promethazine  Allergies:    Allergies  Allergen Reactions  . Codeine Nausea And Vomiting  . Penicillins Hives    Has  patient had a PCN reaction causing immediate rash, facial/tongue/throat swelling, SOB or lightheadedness with hypotension: No Has patient had a PCN reaction causing severe rash involving mucus membranes or skin necrosis: No Has patient had a PCN reaction that required hospitalization No Has patient had a PCN reaction occurring within the last 10 years: No If all of the above answers are "NO", then may proceed with Cephalosporin use.   . Sulfur Nausea And Vomiting    Social History:   Social History   Socioeconomic History  . Marital status: Widowed    Spouse name: Not on file  . Number of children: Not on file  . Years of education: Not on file  . Highest education level: Not on file  Occupational History  . Not on file  Social Needs  . Financial resource strain: Not on file  . Food insecurity    Worry: Not on file    Inability: Not on file  . Transportation needs    Medical: Not on file    Non-medical: Not on file  Tobacco Use  . Smoking status: Never Smoker  . Smokeless tobacco: Never Used  Substance and Sexual Activity  . Alcohol use: No  . Drug use: No  . Sexual  activity: Never  Lifestyle  . Physical activity    Days per week: Not on file    Minutes per session: Not on file  . Stress: Not on file  Relationships  . Social Musician on phone: Not on file    Gets together: Not on file    Attends religious service: Not on file    Active member of club or organization: Not on file    Attends meetings of clubs or organizations: Not on file    Relationship status: Not on file  . Intimate partner violence    Fear of current or ex partner: Not on file    Emotionally abused: Not on file    Physically abused: Not on file    Forced sexual activity: Not on file  Other Topics Concern  . Not on file  Social History Narrative  . Not on file    Family History:   Family History  Problem Relation Age of Onset  . Stroke Mother   . Stroke Brother      ROS:   Please see the history of present illness. All other ROS reviewed and negative.     Physical Exam/Data:   Vitals:   06/22/19 0022 06/22/19 0359 06/22/19 0724 06/22/19 1127  BP: 140/77 (!) 135/54 (!) 146/52 (!) 175/71  Pulse: 78 61 63 74  Resp: 18 17 17 17   Temp: 97.8 F (36.6 C) 98.1 F (36.7 C) (!) 97.5 F (36.4 C) 98.2 F (36.8 C)  TempSrc: Oral Oral Oral Oral  SpO2: 96% 90% 97% 95%    Intake/Output Summary (Last 24 hours) at 06/22/2019 1351 Last data filed at 06/22/2019 0359 Gross per 24 hour  Intake -  Output 1000 ml  Net -1000 ml   Last 3 Weights 10/24/2018 09/11/2018 09/10/2018  Weight (lbs) 184 lb 12.8 oz 182 lb 5.1 oz 185 lb 3 oz  Weight (kg) 83.825 kg 82.7 kg 84 kg     There is no height or weight on file to calculate BMI.  General:  Ill elderly female  in no acute distress HEENT: normal Lymph: no adenopathy Neck: no JVD Endocrine:  No thryomegaly Vascular: No carotid bruits; FA pulses 2+ bilaterally without bruits  Cardiac:  normal S1, S2; IR IR ; no murmur  Lungs:  clear to auscultation bilaterally, no wheezing, rhonchi or rales  Abd: soft, nontender, no hepatomegaly  Ext: no edema Musculoskeletal:  No deformities, BUE and BLE strength normal and equal Skin: warm and dry  Neuro:  no focal abnormalities noted Psych:  Normal affect   EKG:  The EKG was personally reviewed and demonstrates:  Atrial fibrillation at rate of 87 bpm Telemetry:  Telemetry was personally reviewed and demonstrates:  Atrial fibrillation at rate of 70s  Relevant CV Studies: As above  Laboratory Data:  High Sensitivity Troponin:  No results for input(s): TROPONINIHS in the last 720 hours.   Chemistry Recent Labs  Lab 06/18/19 1713 06/19/19 0300 06/19/19 0455  NA 134*  --  136  K 3.8  --  3.6  CL 100  --  103  CO2 23  --  25  GLUCOSE 145*  --  119*  BUN 14  --  16  CREATININE 1.22* 1.12* 1.14*  CALCIUM 10.0  --  10.1  GFRNONAA 39* 44* 43*  GFRAA 45* 50* 49*  ANIONGAP 11   --  8    Recent Labs  Lab 06/18/19 1713  PROT 7.1  ALBUMIN  3.8  AST 26  ALT 16  ALKPHOS 108  BILITOT 1.1   Hematology Recent Labs  Lab 06/18/19 1713 06/19/19 0300 06/19/19 0455  WBC 11.2* 9.2 9.2  RBC 4.20 4.19 4.21  HGB 13.0 12.8 12.9  HCT 37.7 37.7 38.0  MCV 89.8 90.0 90.3  MCH 31.0 30.5 30.6  MCHC 34.5 34.0 33.9  RDW 13.8 13.8 13.8  PLT 170 164 166   BNPNo results for input(s): BNP, PROBNP in the last 168 hours.  DDimer No results for input(s): DDIMER in the last 168 hours.   Lipid Panel     Component Value Date/Time   CHOL 126 06/20/2019 0559   TRIG 75 06/20/2019 0559   HDL 45 06/20/2019 0559   CHOLHDL 2.8 06/20/2019 0559   VLDL 15 06/20/2019 0559   LDLCALC 66 06/20/2019 0559   Radiology/Studies:  Ct Angio Head W Or Wo Contrast  Result Date: 06/20/2019 CLINICAL DATA:  85 year old female with weakness and altered mental status. Right PCA/occipital pole infarct on brain MRI earlier today. EXAM: CT ANGIOGRAPHY HEAD AND NECK TECHNIQUE: Multidetector CT imaging of the head and neck was performed using the standard protocol during bolus administration of intravenous contrast. Multiplanar CT image reconstructions and MIPs were obtained to evaluate the vascular anatomy. Carotid stenosis measurements (when applicable) are obtained utilizing NASCET criteria, using the distal internal carotid diameter as the denominator. CONTRAST:  OMNIPAQUE IOHEXOL 350 MG/ML SOLN COMPARISON:  Brain MRI earlier today. Head CT 06/18/2019. FINDINGS: CT HEAD Brain: Stable small area of cytotoxic edema in the right superior occipital pole from the CT on 06/18/2019. No associated hemorrhage or mass effect. Elsewhere stable non contrast CT appearance of the brain. Confluent bilateral cerebral white matter hypodensity. Calvarium and skull base: No acute osseous abnormality identified. Paranasal sinuses: Visualized paranasal sinuses and mastoids are stable and well pneumatized. Orbits: No acute  orbit or scalp soft tissue findings. CTA NECK Skeleton: Mild for age degenerative changes in the spine. Chronic appearing mild upper thoracic superior endplate compression. No acute osseous abnormality identified. Upper chest: Negative. Other neck: Partially retropharyngeal course of the right carotid, normal variant. No acute findings. Aortic arch: 3 vessel arch configuration. Mild for age Calcified aortic atherosclerosis. Right carotid system: Negative brachiocephalic artery and right CCA origin. Calcified plaque at the right carotid bifurcation mostly affecting the ECA origin. Tortuous cervical right ICA without stenosis. Left carotid system: Negative left CCA. Moderate calcified plaque at the posterior left ICA origin and bulb but stenosis is less than 50 % with respect to the distal vessel. Vertebral arteries: Normal proximal right subclavian artery and right vertebral artery origin. Tortuous right V 2 segment. Patent right vertebral artery to the skull base without stenosis. Mild plaque in the proximal left subclavian artery without stenosis. Normal left vertebral artery origin. Codominant vertebral arteries. Tortuous left V2 segment. Patent left vertebral to the skull base without stenosis. CTA HEAD Posterior circulation: No distal vertebral plaque or stenosis. Patent PICA origins and vertebrobasilar junction. Patent basilar artery without stenosis. Normal SCA and left PCA origins. Fetal type right PCA origin. Left posterior communicating artery diminutive or absent. Mild left P1 and P2 segment irregularity without stenosis. Left PCA branches are within normal limits. Right posterior communicating artery and proximal right PCA are within normal limits. Patent right PCA bifurcation. Superior left PCA branches appear normal. There is a short segment high-grade stenosis of the inferior right P3 division on series 16, image 17 but preserved distal enhancement. Anterior circulation: Both ICA siphons  are patent. On  the left there is mild calcified plaque without stenosis. On the right there is mild to moderate calcified plaque without stenosis. Normal right posterior communicating artery origin. Patent carotid termini. Normal MCA and ACA origins. Ectatic anterior communicating artery but otherwise bilateral ACA branches are within normal limits. Left MCA M1 segment and bifurcation are patent without stenosis. Left MCA branches are within normal limits. Right MCA M1 segment and bifurcation are patent without stenosis. Right MCA branches are within normal limits. Venous sinuses: Early contrast timing, but the superior sagittal sinus, dominant right transverse and sigmoid sinuses are patent along with the right IJ bulb. Anatomic variants: Fetal type right PCA origin. Review of the MIP images confirms the above findings IMPRESSION: 1. Negative for large vessel occlusion or stenosis. 2. Positive for a short segment high-grade stenosis of the inferior Right PCA P3 division, but appears unrelated to the recent superior right occipital pole infarct. 3. Mild for age carotid atherosclerosis. Normal vertebral arteries and basilar. 4. Stable CT appearance of the small right occipital lobe infarct. No associated hemorrhage or mass effect. 5. No new intracranial abnormality. Electronically Signed   By: Odessa Fleming M.D.   On: 06/20/2019 00:05   Ct Head Wo Contrast  Result Date: 06/18/2019 CLINICAL DATA:  85 year old female with altered mental status. EXAM: CT HEAD WITHOUT CONTRAST TECHNIQUE: Contiguous axial images were obtained from the base of the skull through the vertex without intravenous contrast. COMPARISON:  Head CT dated 01/27/2015 FINDINGS: Brain: There is moderate age-related atrophy and advanced chronic microvascular ischemic changes. An area of hypodensity in the medial right occipital lobe (series 5, image 59 and axial series 2 image 50) is age indeterminate. An acute or subacute infarct is not entirely excluded. Clinical  correlation is recommended. MRI may provide better evaluation if there is clinical concern for acute infarct. There is no acute intracranial hemorrhage. No mass effect or midline shift. No extra-axial fluid collection. Vascular: No hyperdense vessel or unexpected calcification. Skull: Normal. Negative for fracture or focal lesion. Sinuses/Orbits: No acute finding. Other: None. IMPRESSION: 1. No acute intracranial hemorrhage. 2. Age indeterminate right occipital infarct. An acute or subacute infarct is not excluded. Clinical correlation is recommended. MRI may provide better evaluation if there is clinical concern for an acute infarct. 3. Age-related atrophy and advanced chronic microvascular ischemic changes. These results were called by telephone at the time of interpretation on 06/18/2019 at 6:46 pm to provider [facility] , who verbally acknowledged these results. Electronically Signed   By: Elgie Collard M.D.   On: 06/18/2019 18:50   Ct Angio Neck W Or Wo Contrast  Result Date: 06/20/2019 CLINICAL DATA:  85 year old female with weakness and altered mental status. Right PCA/occipital pole infarct on brain MRI earlier today. EXAM: CT ANGIOGRAPHY HEAD AND NECK TECHNIQUE: Multidetector CT imaging of the head and neck was performed using the standard protocol during bolus administration of intravenous contrast. Multiplanar CT image reconstructions and MIPs were obtained to evaluate the vascular anatomy. Carotid stenosis measurements (when applicable) are obtained utilizing NASCET criteria, using the distal internal carotid diameter as the denominator. CONTRAST:  OMNIPAQUE IOHEXOL 350 MG/ML SOLN COMPARISON:  Brain MRI earlier today. Head CT 06/18/2019. FINDINGS: CT HEAD Brain: Stable small area of cytotoxic edema in the right superior occipital pole from the CT on 06/18/2019. No associated hemorrhage or mass effect. Elsewhere stable non contrast CT appearance of the brain. Confluent bilateral cerebral  white matter hypodensity. Calvarium and skull base: No  acute osseous abnormality identified. Paranasal sinuses: Visualized paranasal sinuses and mastoids are stable and well pneumatized. Orbits: No acute orbit or scalp soft tissue findings. CTA NECK Skeleton: Mild for age degenerative changes in the spine. Chronic appearing mild upper thoracic superior endplate compression. No acute osseous abnormality identified. Upper chest: Negative. Other neck: Partially retropharyngeal course of the right carotid, normal variant. No acute findings. Aortic arch: 3 vessel arch configuration. Mild for age Calcified aortic atherosclerosis. Right carotid system: Negative brachiocephalic artery and right CCA origin. Calcified plaque at the right carotid bifurcation mostly affecting the ECA origin. Tortuous cervical right ICA without stenosis. Left carotid system: Negative left CCA. Moderate calcified plaque at the posterior left ICA origin and bulb but stenosis is less than 50 % with respect to the distal vessel. Vertebral arteries: Normal proximal right subclavian artery and right vertebral artery origin. Tortuous right V 2 segment. Patent right vertebral artery to the skull base without stenosis. Mild plaque in the proximal left subclavian artery without stenosis. Normal left vertebral artery origin. Codominant vertebral arteries. Tortuous left V2 segment. Patent left vertebral to the skull base without stenosis. CTA HEAD Posterior circulation: No distal vertebral plaque or stenosis. Patent PICA origins and vertebrobasilar junction. Patent basilar artery without stenosis. Normal SCA and left PCA origins. Fetal type right PCA origin. Left posterior communicating artery diminutive or absent. Mild left P1 and P2 segment irregularity without stenosis. Left PCA branches are within normal limits. Right posterior communicating artery and proximal right PCA are within normal limits. Patent right PCA bifurcation. Superior left PCA branches  appear normal. There is a short segment high-grade stenosis of the inferior right P3 division on series 16, image 17 but preserved distal enhancement. Anterior circulation: Both ICA siphons are patent. On the left there is mild calcified plaque without stenosis. On the right there is mild to moderate calcified plaque without stenosis. Normal right posterior communicating artery origin. Patent carotid termini. Normal MCA and ACA origins. Ectatic anterior communicating artery but otherwise bilateral ACA branches are within normal limits. Left MCA M1 segment and bifurcation are patent without stenosis. Left MCA branches are within normal limits. Right MCA M1 segment and bifurcation are patent without stenosis. Right MCA branches are within normal limits. Venous sinuses: Early contrast timing, but the superior sagittal sinus, dominant right transverse and sigmoid sinuses are patent along with the right IJ bulb. Anatomic variants: Fetal type right PCA origin. Review of the MIP images confirms the above findings IMPRESSION: 1. Negative for large vessel occlusion or stenosis. 2. Positive for a short segment high-grade stenosis of the inferior Right PCA P3 division, but appears unrelated to the recent superior right occipital pole infarct. 3. Mild for age carotid atherosclerosis. Normal vertebral arteries and basilar. 4. Stable CT appearance of the small right occipital lobe infarct. No associated hemorrhage or mass effect. 5. No new intracranial abnormality. Electronically Signed   By: Odessa Fleming M.D.   On: 06/20/2019 00:05   Mr Brain Wo Contrast  Result Date: 06/19/2019 CLINICAL DATA:  Weakness and altered mental status.  Lethargy. EXAM: MRI HEAD WITHOUT CONTRAST TECHNIQUE: Multiplanar, multiecho pulse sequences of the brain and surrounding structures were obtained without intravenous contrast. COMPARISON:  Head CT 06/18/2019 FINDINGS: Brain: Diffusion imaging shows a 2-3 cm region of acute infarction in the right  occipital parietal junction region. Mild swelling but no mass effect or hemorrhage. Elsewhere, there chronic small-vessel ischemic changes of the pons. No focal cerebellar insult. Cerebral hemispheres show atrophy with chronic small-vessel  ischemic changes throughout the white matter. No mass, hydrocephalus or extra-axial collection. Vascular: Major vessels at the base of the brain show flow. Skull and upper cervical spine: Negative Sinuses/Orbits: Clear/normal Other: None IMPRESSION: 2-3 cm acute infarction at the right parietooccipital junction. Mild swelling but no mass effect, shift or hemorrhage. Background pattern of acute extensive atrophy and chronic small-vessel ischemic changes elsewhere throughout the brain Electronically Signed   By: Nelson Chimes M.D.   On: 06/19/2019 15:12   Dg Chest Port 1 View  Result Date: 06/18/2019 CLINICAL DATA:  85 year old female with weakness and altered mental status. EXAM: PORTABLE CHEST 1 VIEW COMPARISON:  Chest radiograph dated 09/09/2018 FINDINGS: Evaluation is limited as the left costophrenic angle has been excluded from the image. The visualized lungs are clear. No pleural effusion or pneumothorax. Stable cardiac silhouette. Atherosclerotic calcification of the aortic arch. No acute osseous pathology. Right humeral intramedullary rod. IMPRESSION: No active disease. Electronically Signed   By: Anner Crete M.D.   On: 06/18/2019 23:56    Assessment and Plan:   1. Persistent atrial fibrillation - Daughter at bedside reported that her Xarelto was discontinued by PCP around Fall 2018 due to fall risk however wheel chair bound prior to that. Now presenting with acute stroke. Agree with neurology for dose appropriate anticoagulation. Echo showed admission showed preserved LV function. Her aspirin started this admission. She does not have cardiac indication for ASA.  - Rate controlled on Cardizem CD 180mg  and lopressor 50gm BID.    2. Acute right  parieto-occipital ischemic CVA - as above  3. Chronic diastolic CHF - Euvolemic   For questions or updates, please contact [place] Please consult [email or web address] for contact info under   Jarrett Soho, PA  06/22/2019 1:51 PM    Patient seen and examined. Agree with assessment and plan.  Carrie Best is a very pleasant 85 year old female who has a history of permanent atrial fibrillation, chronic left bundle branch block, hypertension, hyperlipidemia, as well as chronic diastolic heart failure.  Reported remote history of prior CVA.  The patient had been on Xarelto for anticoagulation when last seen by [facility] in 2018, but subsequent to that the patient apparently had a fall and had gone to the emergency room and ultimately her anticoagulation was stopped by her primary care physician.  She had not been on any aspirin therapy.  She presented this admission with confusion and weakness.  CT of her head showed an age-indeterminate cerebellar infarct and her MRI showed an acute right parieto-occipital ischemic CVA.  She has recovered significantly neurologically.  She has now been recommended to reinstitute anticoagulation.  Presently, the patient feels well.  She is not very mobile and needs assistance to walk.  She denies any episodes of chest pain.  An echo Doppler study done on June 20, 2019 reveals normal systolic function without LVH.  Since she was in atrial fibrillation diastolic function could not be assessed.  There is evidence for moderate biatrial enlargement, mild MR, mild TR, and probable moderate pulmonary hypertension with an estimated PA pressure approaching 50 mm.  On exam, had been running in the 130-140 range but 1 reading earlier today showed a elevated blood pressure 175/71.  HEENT is unremarkable.  There is no JVD.  Her lungs were clear.  Rhythm was irregular irregular with controlled rate in the 60s.  There was a faint 1/6 systolic murmur.  Abdomen  was soft nontender.  There was no clubbing cyanosis or edema.  Neurologic exam appears grossly nonfocal with fairly normal strength.  Her ECG revealed atrial fibrillation 81 bpm with nonspecific intraventricular conduction delay on October 13.  I had a long discussion with the patient as well as her daughter.  We discussed the most likely etiology of her CVA is that of atrial fibrillation in the setting of not being anticoagulated.  We discussed risk benefits of treatment.  Despite her age of 85, based on her body weight and renal function, proper Eliquis dosing is 5 mg twice a day.  She does not have any history of underlying CAD and would not institute aspirin therapy may cause increased bleed risk.  I discussed the importance of extra care to avoid falls.  They seem to be in agreement to reinstitute therapy.  CT scan was negative for large vessel occlusion or stenosis and positive for a short segment high-grade stenosis of the inferior Right PCA P3 division, but appears unrelated to the recent superior right occipital pole infarct.  There was evidence for mild carotid atherosclerosis.  I would favor aggressive lipid-lowering therapy with target LDL less than 70.  Her most recent lipid panel reveals she is at target with an LDL of 66. Will initiate atorvastatin 40 mg.   Lennette Bihari, MD, [facility] 06/22/2019 3:04 PM

## 2019-06-22 NOTE — Progress Notes (Signed)
STROKE TEAM PROGRESS NOTE   INTERVAL HISTORY I have personally reviewed history of presenting illness with the patient, electronic medical records and imaging films in PACS.  She presented with change in mental status and A. fib who has not been on anticoagulation.  MRI scan shows a right parieto-occipital embolic infarct with CT angiogram showing the right P3 occlusion.  Patient was previously on Xarelto which was discontinued because of a fall 2 years ago.  Vitals:   06/22/19 0022 06/22/19 0359 06/22/19 0724 06/22/19 1127  BP: 140/77 (!) 135/54 (!) 146/52 (!) 175/71  Pulse: 78 61 63 74  Resp: Temp: 97.8 F (36.6 C) 98.1 F (36.7 C) (!) 97.5 F (36.4 C) 98.2 F (36.8 C)  TempSrc: Oral Oral Oral Oral  SpO2: 96% 90% 97% 95%    CBC:  Recent Labs  Lab 06/19/19 0300 06/19/19 0455  WBC 9.2 9.2  HGB 12.8 12.9  HCT 37.7 38.0  MCV 90.0 90.3  PLT 164 166    Basic Metabolic Panel:  Recent Labs  Lab 06/18/19 1713 06/19/19 0300 06/19/19 0455  NA 134*  --  136  K 3.8  --  3.6  CL 100  --  103  CO2 23  --  25  GLUCOSE 145*  --  119*  BUN 14  --  16  CREATININE 1.22* 1.12* 1.14*  CALCIUM 10.0  --  10.1   Lipid Panel:     Component Value Date/Time   CHOL 126 06/20/2019 0559   TRIG 75 06/20/2019 0559   HDL 45 06/20/2019 0559   CHOLHDL 2.8 06/20/2019 0559   VLDL 15 06/20/2019 0559   LDLCALC 66 06/20/2019 0559   HgbA1c:  Lab Results  Component Value Date   HGBA1C 5.9 (H) 06/20/2019   Urine Drug Screen:     Component Value Date/Time   LABOPIA NONE DETECTED 01/28/2015 0213   COCAINSCRNUR NONE DETECTED 01/28/2015 0213   LABBENZ NONE DETECTED 01/28/2015 0213   AMPHETMU NONE DETECTED 01/28/2015 0213   THCU NONE DETECTED 01/28/2015 0213   LABBARB NONE DETECTED 01/28/2015 0213    Alcohol Level No results found for: ETH  IMAGING Ct Angio Head W Or Wo Contrast  Result Date: 06/20/2019 CLINICAL DATA:  85 year old female with weakness and altered mental  status. Right PCA/occipital pole infarct on brain MRI earlier today. EXAM: CT ANGIOGRAPHY HEAD AND NECK TECHNIQUE: Multidetector CT imaging of the head and neck was performed using the standard protocol during bolus administration of intravenous contrast. Multiplanar CT image reconstructions and MIPs were obtained to evaluate the vascular anatomy. Carotid stenosis measurements (when applicable) are obtained utilizing NASCET criteria, using the distal internal carotid diameter as the denominator. CONTRAST:  OMNIPAQUE IOHEXOL 350 MG/ML SOLN COMPARISON:  Brain MRI earlier today. Head CT 06/18/2019. FINDINGS: CT HEAD Brain: Stable small area of cytotoxic edema in the right superior occipital pole from the CT on 06/18/2019. No associated hemorrhage or mass effect. Elsewhere stable non contrast CT appearance of the brain. Confluent bilateral cerebral white matter hypodensity. Calvarium and skull base: No acute osseous abnormality identified. Paranasal sinuses: Visualized paranasal sinuses and mastoids are stable and well pneumatized. Orbits: No acute orbit or scalp soft tissue findings. CTA NECK Skeleton: Mild for age degenerative changes in the spine. Chronic appearing mild upper thoracic superior endplate compression. No acute osseous abnormality identified. Upper chest: Negative. Other neck: Partially retropharyngeal course of the right carotid, normal variant. No acute findings. Aortic arch: 3 vessel arch  configuration. Mild for age Calcified aortic atherosclerosis. Right carotid system: Negative brachiocephalic artery and right CCA origin. Calcified plaque at the right carotid bifurcation mostly affecting the ECA origin. Tortuous cervical right ICA without stenosis. Left carotid system: Negative left CCA. Moderate calcified plaque at the posterior left ICA origin and bulb but stenosis is less than 50 % with respect to the distal vessel. Vertebral arteries: Normal proximal right subclavian artery and right  vertebral artery origin. Tortuous right V 2 segment. Patent right vertebral artery to the skull base without stenosis. Mild plaque in the proximal left subclavian artery without stenosis. Normal left vertebral artery origin. Codominant vertebral arteries. Tortuous left V2 segment. Patent left vertebral to the skull base without stenosis. CTA HEAD Posterior circulation: No distal vertebral plaque or stenosis. Patent PICA origins and vertebrobasilar junction. Patent basilar artery without stenosis. Normal SCA and left PCA origins. Fetal type right PCA origin. Left posterior communicating artery diminutive or absent. Mild left P1 and P2 segment irregularity without stenosis. Left PCA branches are within normal limits. Right posterior communicating artery and proximal right PCA are within normal limits. Patent right PCA bifurcation. Superior left PCA branches appear normal. There is a short segment high-grade stenosis of the inferior right P3 division on series 16, image 17 but preserved distal enhancement. Anterior circulation: Both ICA siphons are patent. On the left there is mild calcified plaque without stenosis. On the right there is mild to moderate calcified plaque without stenosis. Normal right posterior communicating artery origin. Patent carotid termini. Normal MCA and ACA origins. Ectatic anterior communicating artery but otherwise bilateral ACA branches are within normal limits. Left MCA M1 segment and bifurcation are patent without stenosis. Left MCA branches are within normal limits. Right MCA M1 segment and bifurcation are patent without stenosis. Right MCA branches are within normal limits. Venous sinuses: Early contrast timing, but the superior sagittal sinus, dominant right transverse and sigmoid sinuses are patent along with the right IJ bulb. Anatomic variants: Fetal type right PCA origin. Review of the MIP images confirms the above findings IMPRESSION: 1. Negative for large vessel occlusion or  stenosis. 2. Positive for a short segment high-grade stenosis of the inferior Right PCA P3 division, but appears unrelated to the recent superior right occipital pole infarct. 3. Mild for age carotid atherosclerosis. Normal vertebral arteries and basilar. 4. Stable CT appearance of the small right occipital lobe infarct. No associated hemorrhage or mass effect. 5. No new intracranial abnormality. Electronically Signed   By: Odessa FlemingH  Hall M.D.   On: 06/20/2019 00:05   Ct Head Wo Contrast  Result Date: 06/18/2019 CLINICAL DATA:  85 year old female with altered mental status. EXAM: CT HEAD WITHOUT CONTRAST TECHNIQUE: Contiguous axial images were obtained from the base of the skull through the vertex without intravenous contrast. COMPARISON:  Head CT dated 01/27/2015 FINDINGS: Brain: There is moderate age-related atrophy and advanced chronic microvascular ischemic changes. An area of hypodensity in the medial right occipital lobe (series 5, image 59 and axial series 2 image 6317) is age indeterminate. An acute or subacute infarct is not entirely excluded. Clinical correlation is recommended. MRI may provide better evaluation if there is clinical concern for acute infarct. There is no acute intracranial hemorrhage. No mass effect or midline shift. No extra-axial fluid collection. Vascular: No hyperdense vessel or unexpected calcification. Skull: Normal. Negative for fracture or focal lesion. Sinuses/Orbits: No acute finding. Other: None. IMPRESSION: 1. No acute intracranial hemorrhage. 2. Age indeterminate right occipital infarct. An acute or subacute infarct  is not excluded. Clinical correlation is recommended. MRI may provide better evaluation if there is clinical concern for an acute infarct. 3. Age-related atrophy and advanced chronic microvascular ischemic changes. These results were called by telephone at the time of interpretation on 06/18/2019 at 6:46 pm to provider [facility] , who verbally acknowledged these  results. Electronically Signed   By: Anner Crete M.D.   On: 06/18/2019 18:50   Ct Angio Neck W Or Wo Contrast  Result Date: 06/20/2019 CLINICAL DATA:  85 year old female with weakness and altered mental status. Right PCA/occipital pole infarct on brain MRI earlier today. EXAM: CT ANGIOGRAPHY HEAD AND NECK TECHNIQUE: Multidetector CT imaging of the head and neck was performed using the standard protocol during bolus administration of intravenous contrast. Multiplanar CT image reconstructions and MIPs were obtained to evaluate the vascular anatomy. Carotid stenosis measurements (when applicable) are obtained utilizing NASCET criteria, using the distal internal carotid diameter as the denominator. CONTRAST:  162mL OMNIPAQUE IOHEXOL 350 MG/ML SOLN COMPARISON:  Brain MRI earlier today. Head CT 06/18/2019. FINDINGS: CT HEAD Brain: Stable small area of cytotoxic edema in the right superior occipital pole from the CT on 06/18/2019. No associated hemorrhage or mass effect. Elsewhere stable non contrast CT appearance of the brain. Confluent bilateral cerebral white matter hypodensity. Calvarium and skull base: No acute osseous abnormality identified. Paranasal sinuses: Visualized paranasal sinuses and mastoids are stable and well pneumatized. Orbits: No acute orbit or scalp soft tissue findings. CTA NECK Skeleton: Mild for age degenerative changes in the spine. Chronic appearing mild upper thoracic superior endplate compression. No acute osseous abnormality identified. Upper chest: Negative. Other neck: Partially retropharyngeal course of the right carotid, normal variant. No acute findings. Aortic arch: 3 vessel arch configuration. Mild for age Calcified aortic atherosclerosis. Right carotid system: Negative brachiocephalic artery and right CCA origin. Calcified plaque at the right carotid bifurcation mostly affecting the ECA origin. Tortuous cervical right ICA without stenosis. Left carotid system: Negative left  CCA. Moderate calcified plaque at the posterior left ICA origin and bulb but stenosis is less than 50 % with respect to the distal vessel. Vertebral arteries: Normal proximal right subclavian artery and right vertebral artery origin. Tortuous right V 2 segment. Patent right vertebral artery to the skull base without stenosis. Mild plaque in the proximal left subclavian artery without stenosis. Normal left vertebral artery origin. Codominant vertebral arteries. Tortuous left V2 segment. Patent left vertebral to the skull base without stenosis. CTA HEAD Posterior circulation: No distal vertebral plaque or stenosis. Patent PICA origins and vertebrobasilar junction. Patent basilar artery without stenosis. Normal SCA and left PCA origins. Fetal type right PCA origin. Left posterior communicating artery diminutive or absent. Mild left P1 and P2 segment irregularity without stenosis. Left PCA branches are within normal limits. Right posterior communicating artery and proximal right PCA are within normal limits. Patent right PCA bifurcation. Superior left PCA branches appear normal. There is a short segment high-grade stenosis of the inferior right P3 division on series 16, image 17 but preserved distal enhancement. Anterior circulation: Both ICA siphons are patent. On the left there is mild calcified plaque without stenosis. On the right there is mild to moderate calcified plaque without stenosis. Normal right posterior communicating artery origin. Patent carotid termini. Normal MCA and ACA origins. Ectatic anterior communicating artery but otherwise bilateral ACA branches are within normal limits. Left MCA M1 segment and bifurcation are patent without stenosis. Left MCA branches are within normal limits. Right MCA M1 segment and bifurcation  are patent without stenosis. Right MCA branches are within normal limits. Venous sinuses: Early contrast timing, but the superior sagittal sinus, dominant right transverse and sigmoid  sinuses are patent along with the right IJ bulb. Anatomic variants: Fetal type right PCA origin. Review of the MIP images confirms the above findings IMPRESSION: 1. Negative for large vessel occlusion or stenosis. 2. Positive for a short segment high-grade stenosis of the inferior Right PCA P3 division, but appears unrelated to the recent superior right occipital pole infarct. 3. Mild for age carotid atherosclerosis. Normal vertebral arteries and basilar. 4. Stable CT appearance of the small right occipital lobe infarct. No associated hemorrhage or mass effect. 5. No new intracranial abnormality. Electronically Signed   By: Odessa Fleming M.D.   On: 06/20/2019 00:05   Mr Brain Wo Contrast  Result Date: 06/19/2019 CLINICAL DATA:  Weakness and altered mental status.  Lethargy. EXAM: MRI HEAD WITHOUT CONTRAST TECHNIQUE: Multiplanar, multiecho pulse sequences of the brain and surrounding structures were obtained without intravenous contrast. COMPARISON:  Head CT 06/18/2019 FINDINGS: Brain: Diffusion imaging shows a 2-3 cm region of acute infarction in the right occipital parietal junction region. Mild swelling but no mass effect or hemorrhage. Elsewhere, there chronic small-vessel ischemic changes of the pons. No focal cerebellar insult. Cerebral hemispheres show atrophy with chronic small-vessel ischemic changes throughout the white matter. No mass, hydrocephalus or extra-axial collection. Vascular: Major vessels at the base of the brain show flow. Skull and upper cervical spine: Negative Sinuses/Orbits: Clear/normal Other: None IMPRESSION: 2-3 cm acute infarction at the right parietooccipital junction. Mild swelling but no mass effect, shift or hemorrhage. Background pattern of acute extensive atrophy and chronic small-vessel ischemic changes elsewhere throughout the brain Electronically Signed   By: Paulina Fusi M.D.   On: 06/19/2019 15:12   Dg Chest Port 1 View  Result Date: 06/18/2019 CLINICAL DATA:  85 year old  female with weakness and altered mental status. EXAM: PORTABLE CHEST 1 VIEW COMPARISON:  Chest radiograph dated 09/09/2018 FINDINGS: Evaluation is limited as the left costophrenic angle has been excluded from the image. The visualized lungs are clear. No pleural effusion or pneumothorax. Stable cardiac silhouette. Atherosclerotic calcification of the aortic arch. No acute osseous pathology. Right humeral intramedullary rod. IMPRESSION: No active disease. Electronically Signed   By: Elgie Collard M.D.   On: 06/18/2019 23:56    2D Echocardiogram 06/20/2019  1. Left ventricular ejection fraction, by visual estimation, is 55 to 60%. The left ventricle has mildly decreased function. Normal left ventricular size. There is no left ventricular hypertrophy.  2. Left ventricular diastolic function could not be evaluated pattern of LV diastolic filling.  3. Global right ventricle has normal systolic function.The right ventricular size is normal. No increase in right ventricular wall thickness.  4. Left atrial size was moderately dilated.  5. Right atrial size was moderately dilated.  6. Presence of pericardial fat pad.  7. Small pericardial effusion.  8. The mitral valve is normal in structure. Mild mitral valve regurgitation. No evidence of mitral stenosis.  9. The tricuspid valve is normal in structure. Tricuspid valve regurgitation is mild. 10. The aortic valve is normal in structure. Aortic valve regurgitation was not visualized by color flow Doppler. Structurally normal aortic valve, with no evidence of sclerosis or stenosis. 11. The pulmonic valve was normal in structure. Pulmonic valve regurgitation is not visualized by color flow Doppler. 12. Moderately elevated pulmonary artery systolic pressure. 13. The inferior vena cava is dilated in size with <50% respiratory  variability, suggesting right atrial pressure of 15 mmHg.  EEG 06/20/2019 This study is suggestive of cortical dysfunction in the  right posterior temporal region, likely secondary to underlying infarct. No seizures or epileptiform discharges were seen throughout the recording.   PHYSICAL EXAM Pleasant elderly Caucasian lady not in distress. . Afebrile. Head is nontraumatic. Neck is supple without bruit.    Cardiac exam no murmur or gallop. Lungs are clear to auscultation. Distal pulses are well felt. Neurological Exam ;  Awake  Alert oriented x 3.diminished attention, registration and recall. Normal speech and language.eye movements full without nystagmus.fundi were not visualized. Vision acuity and fields appear normal. Hearing is normal. Palatal movements are normal. Face symmetric. Tongue midline. Normal strength, tone, reflexes and coordination. Normal sensation. Gait deferred.  ASSESSMENT/PLAN [name] is a 85 y.o. female with history of hearing loss, previous stroke, hypertension, hyperlipidemia, heart murmur, dysrhythmia-atrial fibrillation rate controlled and not on anticoagulation due to fall risk, diastolic dysfunction, depression, congestive heart failure presenting less interactive and talkative with HA, lethargy and fecal incontinence.   Stroke:   R occipital infarct embolic secondary to known AF not an AC d/t fall risk  CT head R occipital infarct   MRI  R perioccipital jxn infarct. Small vessel disease. Atrophy.   CTA head & neck no ELVO. Short high grade stenosis inferior R PCA P3. Mild for age ICA atherosclerosis.  2D Echo EF 55-60%. No source of embolus   LDL 66  HgbA1c 5.9  Lovenox 40 mg sq daily for VTE prophylaxis  No antithrombotic prior to admission, now on aspirin 325 mg daily. ** see below  Therapy recommendations:  SNF - cleared for HH at d/c, insurance co denied SNF as not a rehab candidate  Disposition:  pending   Atrial Fibrillation  Home anticoagulation:  none  due to fall risk  On xarelto in past  Dr. Pearlean Brownie discussed with daughter who agreed to change to  Eliquis.   Will stop aspirin and add Eliquis  Hypertension  Stable . Permissive hypertension (OK if < 220/120) but gradually normalize in 5-7 days . Long-term BP goal normotensive  Hyperlipidemia  Home meds:  lipitor 20, resumed in hospital  lipitor increased to 80 mg daily   LDL 66, goal < 70  Given LDL < 60 and advanced age, will not prescribe intensive statin and continue home lipitor dose  Continue statin at discharge  Other Stroke Risk Factors  Advanced age  Obesity, There is no height or weight on file to calculate BMI., recommend weight loss, diet and exercise as appropriate   Hx stroke/TIA - details not available  Family hx stroke (mother, brother)  Hx Congestive heart failure, diastolic dysfunction  Other Active Problems  Leukocytosis, resolved - WBC 11.2->9.2, BCx no growth x 3 days   Hospital day # 3  I have personally obtained history,examined this patient, reviewed notes, independently viewed imaging studies, participated in medical decision making and plan of care.ROS completed by me personally and pertinent positives fully documented  I have made any additions or clarifications directly to the above note. . She has presented with altered mental status likely due to embolic right parieto-occipital infarct from atrial fibrillation.  She has not been on anticoagulation due to prior history of fall on Xarelto.  I discussed with the patient and daughter risk benefit of anticoagulation and favor restarting anticoagulation with Eliquis.  They are in agreement.  Also recommend Lipitor.  Check echocardiogram results.  Ongoing therapy and  rehabilitation consults.  Greater than 50% time during this 35-minute visit was spent on counseling and coordination of care about her embolic stroke and discussion with care team and answering questions.  Discussed with Dr. Margo Aye.  Stroke team will sign off.  Kindly call for questions.  Delia Heady, MD Medical Director [facility] Pager: [phone] 06/22/2019 4:34 PM   To contact Stroke Continuity provider, please refer to [email or web address]. After hours, contact General Neurology

## 2019-06-22 NOTE — Progress Notes (Signed)
PROGRESS NOTE  Carrie Best OJJ:[id number] DOB: [birth year] DOA: 06/18/2019 PCP: [name], MD  HPI/Recap of past 24 hours: Carrie Best is a 85 y.o. female with history of atrial fibrillation, diastolic dysfunction, chronic kidney disease stage II, chronically bedbound was brought to the ER after patient was found to be increasingly confused and lethargic over the last 24 hours.  Patient's daughter provided the history.  As per the patient's daughter the night before last patient started become increasingly confused which continued since yesterday morning.  Patient became more lethargic and moved her bowels in a diaper which usually does not do.  Given the ongoing confusion patient was brought to the ER.  ED Course: In the ER patient was initially only oriented to name and gradually started following commands.  CT head shows age indeterminant cerebellar infarct.  ER physician discussed with on-call neurologist Dr. Leonel Ramsay who advised to get MRI brain but to keep patient addressing and also make sure there is no infectious process.  At the time of my exam patient is alert awake oriented to name moving all extremities.  Labs show WBC 11.2 creatinine 1.2 sodium 134 COVID-19 negative.  UA unremarkable.  EKG shows A. fib. With LBBB rate control.  Chest x-ray does not show anything acute.  06/21/19: Patient was seen and examined at her bedside this morning.  She has no new complaints.  No acute events overnight.  She is alert and oriented x3.  Denies any headaches dizziness or change in her vision.  Acute right parietal occipital CVA.  Neurology stroke team made aware and will see the patient in consultation.  Patient has a history of permanent A. fib, previously on Xarelto, wheelchair bound due to knee issues, unclear why oral anticoagulation was stopped.  06/22/19: Patient was seen and examined at her bedside this morning.  No acute events overnight.  She has no new complaints.  Out of  bed to chair every shift with assistance and fall precautions. Consult place for cardiology to assist with decision on starting [facility] and ASA. Highly appreciate assistance.  Assessment/Plan: Principal Problem:   Acute encephalopathy Active Problems:   HTN (hypertension)   Atrial fibrillation (HCC)   Chronic diastolic CHF (congestive heart failure) (HCC)   Chronic kidney disease (CKD), stage II (mild)   CVA (cerebral vascular accident) ([place])   Acute ischemic CVA, right parieto-occipital Seen on MRI brain History of permanent A. fib previously on Xarelto, unclear why it was stopped.  Also wheelchair-bound due to knee issues. Per medical records, last seen normal on 06/16/19  Admitted for stroke work-up LDL 66 goal less than 70 A1c 5.9 goal less than 7.0 2D echo normal LVEF 55 to 60% with no PFO. Currently on Lipitor to 80 mg daily Currently on ASA 325 mg Goal BP normotensive gradually Resume diltiazem and Imdur on 06/21/19 Continue beta-blocker for rate control PT OT/speech therapist PT OT recommends SNF Speech recommends 24-hour supervision/assistance for cognitive communication deficit Will be seen by neurology stroke team, defer to stroke team and cardiology for decision regarding anticoagulation.  Permanent A. fib, not on oral anticoagulation Followed by Dr. Acie Fredrickson Was previously on Xarelto, unclear why it was stopped Chads Vascor 7 Rate controlled Continue p.o. Cardizem and p.o. [facility] consulted to assist with decision on starting [facility] and ASA. Continue to monitor on telemetry  Essential hypertension Continue diltiazem p.o. 180 mg daily, Imdur 30 mg daily, Lopressor p.o. 50 mg twice daily   Chronic diastolic CHF Continue to  hold off torsemide for now Continue strict I's and O's and daily weight Net I&O -1.7 L Continue to monitor volume status  Resolved leukocytosis Presented with mild leukocytosis WBC 11.2 WBC 9K on 06/19/2019 Blood cultures x2  negative x3 days  Ambulatory dysfunction/wheelchair-bound PT OT recommendations for SNF. CSW assisting with placement  insurance denied Possible peer-to-peer   DVT prophylaxis: Lovenox sq daily Code Status: DNR  Family Communication: None at bedside Disposition Plan:  Home with home health services versus SNF.  Consults called: Neurology    Objective: Vitals:   06/22/19 0022 06/22/19 0359 06/22/19 0724 06/22/19 1127  BP: 140/77 (!) 135/54 (!) 146/52 (!) 175/71  Pulse: 78 61 63 74  Resp: Temp: 97.8 F (36.6 C) 98.1 F (36.7 C) (!) 97.5 F (36.4 C) 98.2 F (36.8 C)  TempSrc: Oral Oral Oral Oral  SpO2: 96% 90% 97% 95%    Intake/Output Summary (Last 24 hours) at 06/22/2019 1241 Last data filed at 06/22/2019 0359 Gross per 24 hour  Intake 50 ml  Output 1000 ml  Net -950 ml   There were no vitals filed for this visit.  Exam:  . General: 85 y.o. year-old female well-developed well-nourished no acute distress.  Alert and interactive.  Very hard of hearing. . Cardiovascular: Irregular rate and rhythm no rubs or gallops no JVD or thyromegaly noted.   Marland Kitchen Respiratory: Clear to auscultation no wheezes or rales.  Poor inspiratory effort. . Abdomen: Obese nontender nondistended bowel sounds present. . Musculoskeletal: Trace lower extremity edema.  12 4 pulses in all 4 extremities.   Marland Kitchen Psychiatry: Mood is appropriate for condition and setting.   Data Reviewed: CBC: Recent Labs  Lab 06/18/19 1713 06/19/19 0300 06/19/19 0455  WBC 11.2* 9.2 9.2  HGB 13.0 12.8 12.9  HCT 37.7 37.7 38.0  MCV 89.8 90.0 90.3  PLT 170 164 166   Basic Metabolic Panel: Recent Labs  Lab 06/18/19 1713 06/19/19 0300 06/19/19 0455  NA 134*  --  136  K 3.8  --  3.6  CL 100  --  103  CO2 23  --  25  GLUCOSE 145*  --  119*  BUN 14  --  16  CREATININE 1.22* 1.12* 1.14*  CALCIUM 10.0  --  10.1   GFR: CrCl cannot be calculated (Unknown ideal weight.). Liver Function  Tests: Recent Labs  Lab 06/18/19 1713  AST 26  ALT 16  ALKPHOS 108  BILITOT 1.1  PROT 7.1  ALBUMIN 3.8   No results for input(s): LIPASE, AMYLASE in the last 168 hours. Recent Labs  Lab 06/19/19 0547  AMMONIA 37*   Coagulation Profile: No results for input(s): INR, PROTIME in the last 168 hours. Cardiac Enzymes: No results for input(s): CKTOTAL, CKMB, CKMBINDEX, TROPONINI in the last 168 hours. BNP (last 3 results) No results for input(s): PROBNP in the last 8760 hours. HbA1C: Recent Labs    06/20/19 0559  HGBA1C 5.9*   CBG: Recent Labs  Lab 06/18/19 1711  GLUCAP 143*   Lipid Profile: Recent Labs    06/20/19 0559  CHOL 126  HDL 45  LDLCALC 66  TRIG 75  CHOLHDL 2.8   Thyroid Function Tests: No results for input(s): TSH, T4TOTAL, FREET4, T3FREE, THYROIDAB in the last 72 hours. Anemia Panel: No results for input(s): VITAMINB12, FOLATE, FERRITIN, TIBC, IRON, RETICCTPCT in the last 72 hours. Urine analysis:    Component Value Date/Time   COLORURINE YELLOW 06/18/2019 2019   APPEARANCEUR  CLEAR 06/18/2019 2019   LABSPEC 1.012 06/18/2019 2019   PHURINE 7.0 06/18/2019 2019   GLUCOSEU NEGATIVE 06/18/2019 2019   HGBUR NEGATIVE 06/18/2019 2019   BILIRUBINUR NEGATIVE 06/18/2019 2019   KETONESUR NEGATIVE 06/18/2019 2019   PROTEINUR NEGATIVE 06/18/2019 2019   UROBILINOGEN 0.2 02/07/2015 1534   NITRITE NEGATIVE 06/18/2019 2019   LEUKOCYTESUR NEGATIVE 06/18/2019 2019   Sepsis Labs: @LABRCNTIP (procalcitonin:4,lacticidven:4)  ) Recent Results (from the past 240 hour(s))  SARS CORONAVIRUS 2 (TAT 6-24 HRS) Nasopharyngeal Nasopharyngeal Swab     Status: None   Collection Time: 06/18/19  5:45 PM   Specimen: Nasopharyngeal Swab  Result Value Ref Range Status   SARS Coronavirus 2 NEGATIVE NEGATIVE Final    Comment: (NOTE) SARS-CoV-2 target nucleic acids are NOT DETECTED. The SARS-CoV-2 RNA is generally detectable in upper and lower respiratory specimens during the  acute phase of infection. Negative results do not preclude SARS-CoV-2 infection, do not rule out co-infections with other pathogens, and should not be used as the sole basis for treatment or other patient management decisions. Negative results must be combined with clinical observations, patient history, and epidemiological information. The expected result is Negative. Fact Sheet for Patients: 08/18/19 Fact Sheet for Healthcare Providers: HairSlick.no This test is not yet approved or cleared by the quierodirigir.com FDA and  has been authorized for detection and/or diagnosis of SARS-CoV-2 by FDA under an Emergency Use Authorization (EUA). This EUA will remain  in effect (meaning this test can be used) for the duration of the COVID-19 declaration under Section 56 4(b)(1) of the Act, 21 U.S.C. section 360bbb-3(b)(1), unless the authorization is terminated or revoked sooner. Performed at [facility], [address] [place], [place]   Culture, blood (routine x 2)     Status: None (Preliminary result)   Collection Time: 06/19/19  1:25 AM   Specimen: BLOOD  Result Value Ref Range Status   Specimen Description   Final    BLOOD BLOOD LEFT WRIST Performed at [facility], [address] [place], [place]    Special Requests   Final    BOTTLES DRAWN AEROBIC AND ANAEROBIC Blood Culture results may not be optimal due to an inadequate volume of blood received in culture bottles Performed at [facility], [address] [place], [place]    Culture   Final    NO GROWTH 3 DAYS Performed at [facility], [address] [place], [place]    Report Status PENDING  Incomplete  Culture, blood (routine x 2)     Status: None (Preliminary result)   Collection Time: 06/19/19  1:25 AM   Specimen: BLOOD  Result Value Ref Range Status   Specimen  Description   Final    BLOOD BLOOD LEFT HAND Performed at [facility], [address] [place], [place]    Special Requests   Final    BOTTLES DRAWN AEROBIC AND ANAEROBIC Blood Culture results may not be optimal due to an inadequate volume of blood received in culture bottles Performed at [facility], [address], [place], [place]    Culture   Final    NO GROWTH 3 DAYS Performed at [facility], [address], [place], [place]    Report Status PENDING  Incomplete      Studies: No results found.  Scheduled Meds: .  stroke: mapping our early stages of recovery  book   Does not apply Once  . aspirin  325 mg Oral Daily  . atorvastatin  80 mg Oral Daily  . diltiazem  180 mg Oral Daily  . DULoxetine  60 mg Oral Daily  . enoxaparin (LOVENOX) injection  40 mg Subcutaneous Q24H  . isosorbide mononitrate  30 mg Oral Daily  . metoprolol tartrate  50 mg Oral BID  . sodium chloride flush  3 mL Intravenous Once    Continuous Infusions:    LOS: 3 days     Carrie Droparole N Jailyne Chieffo, MD [facility] Pager [phone]  If 7PM-7AM, please contact night-coverage [email or web address] Password [email or web address] 06/22/2019, 12:41 PM

## 2019-06-23 DIAGNOSIS — I4821 Permanent atrial fibrillation: Secondary | ICD-10-CM

## 2019-06-23 NOTE — Progress Notes (Signed)
Cardiology Progress Note  Patient ID: Carrie Best MRN: [id number] DOB: 08-01-1928 Date of Encounter: 06/23/2019  Primary Cardiologist: Kristeen Miss, MD  Subjective  Atrial fibrillation remains rate controlled.  Restarted on Eliquis.  No complaints this morning.  ROS:  All other ROS reviewed and negative. Pertinent positives noted in the HPI.     Inpatient Medications  Scheduled Meds: .  stroke: mapping our early stages of recovery book   Does not apply Once  . apixaban  5 mg Oral BID  . atorvastatin  40 mg Oral Daily  . diltiazem  180 mg Oral Daily  . DULoxetine  60 mg Oral Daily  . isosorbide mononitrate  30 mg Oral Daily  . metoprolol tartrate  50 mg Oral BID  . sodium chloride flush  3 mL Intravenous Once   Continuous Infusions:  PRN Meds: acetaminophen **OR** acetaminophen, ondansetron **OR** ondansetron (ZOFRAN) IV, promethazine   Vital Signs   Vitals:   06/22/19 1923 06/22/19 2341 06/23/19 0433 06/23/19 0746  BP: (!) 147/70 128/73 (!) 160/76 (!) 160/90  Pulse: (!) 56 (!) 53 65 64  Resp: 16 18 17 15   Temp: 98.2 F (36.8 C) 98.2 F (36.8 C) 98.3 F (36.8 C) 98.2 F (36.8 C)  TempSrc: Oral  Oral Oral  SpO2: 97% 98% 96% 95%  Weight:        Intake/Output Summary (Last 24 hours) at 06/23/2019 1211 Last data filed at 06/23/2019 0433 Gross per 24 hour  Intake 420 ml  Output 900 ml  Net -480 ml   Last 3 Weights 06/22/2019 10/24/2018 09/11/2018  Weight (lbs) 182 lb 12.2 oz 184 lb 12.8 oz 182 lb 5.1 oz  Weight (kg) 82.9 kg 83.825 kg 82.7 kg      Telemetry  Overnight telemetry shows atrial fibrillation with heart rate in the 60s, which I personally reviewed.   Physical Exam   Vitals:   06/22/19 1923 06/22/19 2341 06/23/19 0433 06/23/19 0746  BP: (!) 147/70 128/73 (!) 160/76 (!) 160/90  Pulse: (!) 56 (!) 53 65 64  Resp: 16 18 17 15   Temp: 98.2 F (36.8 C) 98.2 F (36.8 C) 98.3 F (36.8 C) 98.2 F (36.8 C)  TempSrc: Oral  Oral Oral  SpO2: 97% 98%  96% 95%  Weight:         Intake/Output Summary (Last 24 hours) at 06/23/2019 1211 Last data filed at 06/23/2019 0433 Gross per 24 hour  Intake 420 ml  Output 900 ml  Net -480 ml    Last 3 Weights 06/22/2019 10/24/2018 09/11/2018  Weight (lbs) 182 lb 12.2 oz 184 lb 12.8 oz 182 lb 5.1 oz  Weight (kg) 82.9 kg 83.825 kg 82.7 kg    Body mass index is 28.62 kg/m.  General: Well nourished, well developed, in no acute distress Head: Atraumatic, normal size  Eyes: PEERLA, EOMI  Neck: Supple, no JVD Endocrine: No thryomegaly Cardiac: Irregular rhythm, no murmurs rubs or gallops Lungs: Clear to auscultation bilaterally, no wheezing, rhonchi or rales  Abd: Soft, nontender, no hepatomegaly  Ext: No edema, pulses 2+ Musculoskeletal: No deformities, BUE and BLE strength normal and equal Skin: Warm and dry, no rashes   Neuro: Alert and oriented to person, place, time, and situation, CNII-XII grossly intact, no focal deficits  Psych: Normal mood and affect   Labs  High Sensitivity Troponin:  No results for input(s): TROPONINIHS in the last 720 hours.   Cardiac EnzymesNo results for input(s): TROPONINI in the last 168 hours. No  results for input(s): TROPIPOC in the last 168 hours.  Chemistry Recent Labs  Lab 06/18/19 1713 06/19/19 0300 06/19/19 0455  NA 134*  --  136  K 3.8  --  3.6  CL 100  --  103  CO2 23  --  25  GLUCOSE 145*  --  119*  BUN 14  --  16  CREATININE 1.22* 1.12* 1.14*  CALCIUM 10.0  --  10.1  PROT 7.1  --   --   ALBUMIN 3.8  --   --   AST 26  --   --   ALT 16  --   --   ALKPHOS 108  --   --   BILITOT 1.1  --   --   GFRNONAA 39* 44* 43*  GFRAA 45* 50* 49*  ANIONGAP 11  --  8    Hematology Recent Labs  Lab 06/18/19 1713 06/19/19 0300 06/19/19 0455  WBC 11.2* 9.2 9.2  RBC 4.20 4.19 4.21  HGB 13.0 12.8 12.9  HCT 37.7 37.7 38.0  MCV 89.8 90.0 90.3  MCH 31.0 30.5 30.6  MCHC 34.5 34.0 33.9  RDW 13.8 13.8 13.8  PLT 170 164 166   BNPNo results for input(s):  BNP, PROBNP in the last 168 hours.  DDimer No results for input(s): DDIMER in the last 168 hours.   Radiology  No results found.  Cardiac Studies  TTE 06/20/2019  1. Left ventricular ejection fraction, by visual estimation, is 55 to 60%. The left ventricle has mildly decreased function. Normal left ventricular size. There is no left ventricular hypertrophy.  2. Left ventricular diastolic function could not be evaluated pattern of LV diastolic filling.  3. Global right ventricle has normal systolic function.The right ventricular size is normal. No increase in right ventricular wall thickness.  4. Left atrial size was moderately dilated.  5. Right atrial size was moderately dilated.  6. Presence of pericardial fat pad.  7. Small pericardial effusion.  8. The mitral valve is normal in structure. Mild mitral valve regurgitation. No evidence of mitral stenosis.  9. The tricuspid valve is normal in structure. Tricuspid valve regurgitation is mild. 10. The aortic valve is normal in structure. Aortic valve regurgitation was not visualized by color flow Doppler. Structurally normal aortic valve, with no evidence of sclerosis or stenosis. 11. The pulmonic valve was normal in structure. Pulmonic valve regurgitation is not visualized by color flow Doppler. 12. Moderately elevated pulmonary artery systolic pressure. 13. The inferior vena cava is dilated in size with <50% respiratory variability, suggesting right atrial pressure of 15 mmHg.   Patient Profile  Carrie Best is a 85 y.o. female with history of permanent atrial fibrillation, left bundle branch block, hypertension, diastolic heart failure who was admitted for acute stroke.  Cardiology was consulted regarding resumption of anticoagulation.  Assessment & Plan   1.  Permanent atrial fibrillation -She is adequately rate controlled on home Cardizem and Lopressor please continue this. -We started on Eliquis 5 mg twice daily and will remain  on this going home.   [facility] will sign off.   Medication Recommendations: As above Other recommendations (labs, testing, etc): None Follow up as an outpatient: We will arrange follow-up in 3 months with Dr. Katharina Caper, her primary cardiologist  For questions or updates, please contact [place] Please consult [email or web address] for contact info under        Signed, [place] B[place] T. Audie Box, [place]  06/23/2019 12:11 PM

## 2019-06-23 NOTE — Discharge Instructions (Addendum)
Ischemic Stroke  An ischemic stroke is the sudden death of brain tissue. Blood carries oxygen to all areas of the body. This type of stroke happens when your blood does not flow to your brain like normal. Your brain cannot get the oxygen it needs. This is an emergency. It must be treated right away. Symptoms of a stroke usually happen all of a sudden. You may notice them when you wake up. They can include:  Weakness or loss of feeling in your face, arm, or leg. This often happens on one side of the body.  Trouble walking.  Trouble moving your arms or legs.  Loss of balance or coordination.  Feeling confused.  Trouble talking or understanding what people are saying.  Slurred speech.  Trouble seeing.  Seeing two of one object (double vision).  Feeling dizzy.  Feeling sick to your stomach (nauseous) and throwing up (vomiting).  A very bad headache for no reason. Get help as soon as any of these problems start. This is important. Some treatments work better if they are given right away. These include:  Aspirin.  Medicines to control blood pressure.  A shot (injection) of medicine to break up the blood clot.  Treatments given in the blood vessel (artery) to take out the clot or break it up. Other treatments may include:  Oxygen.  Fluids given through an IV tube.  Medicines to thin out your blood.  Procedures to help your blood flow better. What increases the risk? Certain things may make you more likely to have a stroke. Some of these are things that you can change, such as:  Being very overweight (obesity).  Smoking.  Taking birth control pills.  Not being active.  Drinking too much alcohol.  Using drugs. Other risk factors include:  High blood pressure.  High cholesterol.  Diabetes.  Heart disease.  Being [place] American, Native American, Hispanic, or [place] Native.  Being over age 37.  Family history of stroke.  Having had blood clots,  stroke, or warning stroke (transient ischemic attack, TIA) in the past.  Sickle cell disease.  Being a woman with a history of high blood pressure in pregnancy (preeclampsia).  Migraine headache.  Sleep apnea.  Having an irregular heartbeat (atrial fibrillation).  Long-term (chronic) diseases that cause soreness and swelling (inflammation).  Disorders that affect how your blood clots. Follow these instructions at home: Medicines  Take over-the-counter and prescription medicines only as told by your doctor.  If you were told to take aspirin or another medicine to thin your blood, take it exactly as told by your doctor. ? Taking too much of the medicine can cause bleeding. ? If you do not take enough, it may not work as well.  Know the side effects of your medicines. If you are taking a blood thinner, make sure you: ? Hold pressure over any cuts for longer than usual. ? Tell your dentist and other doctors that you take this medicine. ? Avoid activities that may cause damage or injury to your body. Eating and drinking  Follow instructions from your doctor about what you cannot eat or drink.  Eat healthy foods.  If you have trouble with swallowing, do these things to avoid choking: ? Take small bites when eating. ? Eat foods that are soft or pureed. Safety  Follow instructions from your health care team about physical activity.  Use a walker or cane as told by your doctor.  Keep your home safe so you do not  fall. This may include: ? Having experts look at your home to make sure it is safe. ? Putting grab bars in the bedroom and bathroom. ? Using raised toilets. ? Putting a seat in the shower. General instructions  Do not use any tobacco products. ? Examples of these are cigarettes, chewing tobacco, and e-cigarettes. ? If you need help quitting, ask your doctor.  Limit how much alcohol you drink. This means no more than 1 drink a day for nonpregnant women and 2 drinks  a day for men. One drink equals 12 oz of beer, 5 oz of wine, or 1 oz of hard liquor.  If you need help to stop using drugs or alcohol, ask your doctor to refer you to a program or specialist.  Stay active. Exercise as told by your doctor.  Keep all follow-up visits as told by your doctor. This is important. Get help right away if:   You have any signs of a stroke. "BE FAST" is an easy way to remember the main warning signs: ? B - Balance. Signs are dizziness, sudden trouble walking, or loss of balance. ? E - Eyes. Signs are trouble seeing or a change in how you see. ? F - Face. Signs are sudden weakness or loss of feeling of the face, or the face or eyelid drooping on one side. ? A - Arms. Signs are weakness or loss of feeling in an arm. This happens suddenly and usually on one side of the body. ? S - Speech. Signs are sudden trouble speaking, slurred speech, or trouble understanding what people say. ? T - Time. Time to call emergency services. Write down what time symptoms started.  You have other signs of a stroke, such as: ? A sudden, very bad headache with no known cause. ? Feeling sick to your stomach (nausea). ? Throwing up (vomiting). ? Jerky movements you cannot control (seizure). These symptoms may be an emergency. Do not wait to see if the symptoms will go away. Get medical help right away. Call your local emergency services (911 in the [place] Do not drive yourself to the hospital. Summary  An ischemic stroke is the sudden death of brain tissue.  Symptoms of a stroke usually happen all of a sudden. You may notice them when you wake up.  Get help if you have any warning signs of a stroke. This is important. Some treatments work better if they are given right away. This information is not intended to replace advice given to you by your health care provider. Make sure you discuss any questions you have with your health care provider. Document Released: 08/12/2011 Document  Revised: 02/01/2018 Document Reviewed: 11/19/2015 Elsevier Patient Education  2020 [employer].   Ischemic Stroke  An ischemic stroke is the sudden death of brain tissue. Blood carries oxygen to all areas of the body. This type of stroke happens when your blood does not flow to your brain like normal. Your brain cannot get the oxygen it needs. This is an emergency. It must be treated right away. Symptoms of a stroke usually happen all of a sudden. You may notice them when you wake up. They can include:  Weakness or loss of feeling in your face, arm, or leg. This often happens on one side of the body.  Trouble walking.  Trouble moving your arms or legs.  Loss of balance or coordination.  Feeling confused.  Trouble talking or understanding what people are saying.  Slurred speech.  Trouble  seeing.  Seeing two of one object (double vision).  Feeling dizzy.  Feeling sick to your stomach (nauseous) and throwing up (vomiting).  A very bad headache for no reason. Get help as soon as any of these problems start. This is important. Some treatments work better if they are given right away. These include:  Aspirin.  Medicines to control blood pressure.  A shot (injection) of medicine to break up the blood clot.  Treatments given in the blood vessel (artery) to take out the clot or break it up. Other treatments may include:  Oxygen.  Fluids given through an IV tube.  Medicines to thin out your blood.  Procedures to help your blood flow better. What increases the risk? Certain things may make you more likely to have a stroke. Some of these are things that you can change, such as:  Being very overweight (obesity).  Smoking.  Taking birth control pills.  Not being active.  Drinking too much alcohol.  Using drugs. Other risk factors include:  High blood pressure.  High cholesterol.  Diabetes.  Heart disease.  Being [place] American, Native [address],  Hispanic, or [place] Native.  Being over age 76.  Family history of stroke.  Having had blood clots, stroke, or warning stroke (transient ischemic attack, TIA) in the past.  Sickle cell disease.  Being a woman with a history of high blood pressure in pregnancy (preeclampsia).  Migraine headache.  Sleep apnea.  Having an irregular heartbeat (atrial fibrillation).  Long-term (chronic) diseases that cause soreness and swelling (inflammation).  Disorders that affect how your blood clots. Follow these instructions at home: Medicines  Take over-the-counter and prescription medicines only as told by your doctor.  If you were told to take aspirin or another medicine to thin your blood, take it exactly as told by your doctor. ? Taking too much of the medicine can cause bleeding. ? If you do not take enough, it may not work as well.  Know the side effects of your medicines. If you are taking a blood thinner, make sure you: ? Hold pressure over any cuts for longer than usual. ? Tell your dentist and other doctors that you take this medicine. ? Avoid activities that may cause damage or injury to your body. Eating and drinking  Follow instructions from your doctor about what you cannot eat or drink.  Eat healthy foods.  If you have trouble with swallowing, do these things to avoid choking: ? Take small bites when eating. ? Eat foods that are soft or pureed. Safety  Follow instructions from your health care team about physical activity.  Use a walker or cane as told by your doctor.  Keep your home safe so you do not fall. This may include: ? Having experts look at your home to make sure it is safe. ? Putting grab bars in the bedroom and bathroom. ? Using raised toilets. ? Putting a seat in the shower. General instructions  Do not use any tobacco products. ? Examples of these are cigarettes, chewing tobacco, and e-cigarettes. ? If you need help quitting, ask your  doctor.  Limit how much alcohol you drink. This means no more than 1 drink a day for nonpregnant women and 2 drinks a day for men. One drink equals 12 oz of beer, 5 oz of wine, or 1 oz of hard liquor.  If you need help to stop using drugs or alcohol, ask your doctor to refer you to a program or specialist.  Stay active. Exercise as told by your doctor.  Keep all follow-up visits as told by your doctor. This is important. Get help right away if:   You have any signs of a stroke. "BE FAST" is an easy way to remember the main warning signs: ? B - Balance. Signs are dizziness, sudden trouble walking, or loss of balance. ? E - Eyes. Signs are trouble seeing or a change in how you see. ? F - Face. Signs are sudden weakness or loss of feeling of the face, or the face or eyelid drooping on one side. ? A - Arms. Signs are weakness or loss of feeling in an arm. This happens suddenly and usually on one side of the body. ? S - Speech. Signs are sudden trouble speaking, slurred speech, or trouble understanding what people say. ? T - Time. Time to call emergency services. Write down what time symptoms started.  You have other signs of a stroke, such as: ? A sudden, very bad headache with no known cause. ? Feeling sick to your stomach (nausea). ? Throwing up (vomiting). ? Jerky movements you cannot control (seizure). These symptoms may be an emergency. Do not wait to see if the symptoms will go away. Get medical help right away. Call your local emergency services (911 in the [place] Do not drive yourself to the hospital. Summary  An ischemic stroke is the sudden death of brain tissue.  Symptoms of a stroke usually happen all of a sudden. You may notice them when you wake up.  Get help if you have any warning signs of a stroke. This is important. Some treatments work better if they are given right away. This information is not intended to replace advice given to you by your health care provider.  Make sure you discuss any questions you have with your health care provider. Document Released: 08/12/2011 Document Revised: 02/01/2018 Document Reviewed: 11/19/2015 Elsevier Patient Education  2020 [employer] Information on my medicine - ELIQUIS (apixaban)  This medication education was reviewed with me or my healthcare representative as part of my discharge preparation.  Why was Eliquis prescribed for you? Eliquis was prescribed for you to reduce the risk of a blood clot forming that can cause a stroke if you have a medical condition called atrial fibrillation (a type of irregular heartbeat).  What do You need to know about Eliquis ? Take your Eliquis TWICE DAILY - one tablet in the morning and one tablet in the evening with or without food. If you have difficulty swallowing the tablet whole please discuss with your pharmacist how to take the medication safely.  Take Eliquis exactly as prescribed by your doctor and DO NOT stop taking Eliquis without talking to the doctor who prescribed the medication.  Stopping may increase your risk of developing a stroke.  Refill your prescription before you run out.  After discharge, you should have regular check-up appointments with your healthcare provider that is prescribing your Eliquis.  In the future your dose may need to be changed if your kidney function or weight changes by a significant amount or as you get older.  What do you do if you miss a dose? If you miss a dose, take it as soon as you remember on the same day and resume taking twice daily.  Do not take more than one dose of ELIQUIS at the same time to make up a missed dose.  Important Safety Information A possible side effect of Eliquis is bleeding.  You should call your healthcare provider right away if you experience any of the following: ? Bleeding from an injury or your nose that does not stop. ? Unusual colored urine (red or dark brown) or unusual colored stools (red or  black). ? Unusual bruising for unknown reasons. ? A serious fall or if you hit your head (even if there is no bleeding).  Some medicines may interact with Eliquis and might increase your risk of bleeding or clotting while on Eliquis. To help avoid this, consult your healthcare provider or pharmacist prior to using any new prescription or non-prescription medications, including herbals, vitamins, non-steroidal anti-inflammatory drugs (NSAIDs) and supplements.  This website has more information on Eliquis (apixaban): http://[email or web address].com/eliquis/home

## 2019-06-23 NOTE — Plan of Care (Signed)
  Problem: Education: Goal: Knowledge of General Education information will improve Description: Including pain rating scale, medication(s)/side effects and non-pharmacologic comfort measures Outcome: Progressing   Problem: Health Behavior/Discharge Planning: Goal: Ability to manage health-related needs will improve Outcome: Progressing   Problem: Clinical Measurements: Goal: Ability to maintain clinical measurements within normal limits will improve Outcome: Progressing Goal: Will remain free from infection Outcome: Progressing Goal: Diagnostic test results will improve Outcome: Progressing Goal: Respiratory complications will improve Outcome: Progressing Goal: Cardiovascular complication will be avoided Outcome: Progressing   Problem: Activity: Goal: Risk for activity intolerance will decrease Outcome: Progressing   Problem: Nutrition: Goal: Adequate nutrition will be maintained Outcome: Progressing   Problem: Coping: Goal: Level of anxiety will decrease Outcome: Progressing   Problem: Elimination: Goal: Will not experience complications related to bowel motility Outcome: Progressing Goal: Will not experience complications related to urinary retention Outcome: Progressing   Problem: Pain Managment: Goal: General experience of comfort will improve Outcome: Progressing   Problem: Safety: Goal: Ability to remain free from injury will improve Outcome: Progressing   Problem: Skin Integrity: Goal: Risk for impaired skin integrity will decrease Outcome: Progressing   Problem: Education: Goal: Knowledge of secondary prevention will improve Outcome: Progressing Goal: Knowledge of patient specific risk factors addressed and post discharge goals established will improve Outcome: Progressing Goal: Individualized Educational Video(s) Outcome: Progressing   Tenea Sens, BSN, RN 

## 2019-06-23 NOTE — Progress Notes (Signed)
PROGRESS NOTE  Carrie Best UJW:[id number] DOB: 11-Jul-1928 DOA: 06/18/2019 PCP: Kirby FunkGriffin, John, MD  HPI/Recap of past 24 hours: Carrie DikeFrances A Best is a 85 y.o. female with history of atrial fibrillation, diastolic dysfunction, chronic kidney disease stage II, chronically bedbound was brought to the ER after patient was found to be increasingly confused and lethargic over the last 24 hours.  Patient's daughter provided the history.  As per the patient's daughter the night before last patient started become increasingly confused which continued since yesterday morning.  Patient became more lethargic and moved her bowels in a diaper which usually does not do.  Given the ongoing confusion patient was brought to the ER.  ED Course: In the ER patient was initially only oriented to name and gradually started following commands.  CT head shows age indeterminant cerebellar infarct.  ER physician discussed with on-call neurologist Dr. Amada JupiterKirkpatrick who advised to get MRI brain but to keep patient addressing and also make sure there is no infectious process.  At the time of my exam patient is alert awake oriented to name moving all extremities.  Labs show WBC 11.2 creatinine 1.2 sodium 134 COVID-19 negative.  UA unremarkable.  EKG shows A. fib. With LBBB rate control.  Chest x-ray does not show anything acute.  06/21/19: Patient was seen and examined at her bedside this morning.  She has no new complaints.  No acute events overnight.  She is alert and oriented x3.  Denies any headaches dizziness or change in her vision.  Acute right parietal occipital CVA.  Neurology stroke team made aware and will see the patient in consultation.  Patient has a history of permanent A. fib, previously on Xarelto, wheelchair bound due to knee issues, unclear why oral anticoagulation was stopped.  06/22/19: Patient was seen and examined at her bedside this morning.  No acute events overnight.  She has no new complaints.  Out of  bed to chair every shift with assistance and fall precautions. Consult place for cardiology [facility]. Highly appreciate assistance.  06/23/19: patient was seen and examined at bedside this morning.  She has no new complaints.  Seen by cardiology, signed off.  Awaiting placement.  Assessment/Plan: Principal Problem:   Acute encephalopathy Active Problems:   HTN (hypertension)   Hyperlipidemia with target LDL less than 70   Atrial fibrillation (HCC)   Chronic diastolic CHF (congestive heart failure) (HCC)   Chronic kidney disease (CKD), stage II (mild)   CVA (cerebral vascular accident) (HCC)   Acute ischemic CVA, right parieto-occipital Seen on MRI brain History of permanent A. fib previously on Xarelto, unclear why it was stopped.  Also wheelchair-bound due to knee issues. Per medical records, last seen normal on 06/16/19  Admitted for stroke work-up LDL 66 goal less than 70 A1c 5.9 goal less than 7.0 2D echo normal LVEF 55 to 60% with no PFO. Currently on Lipitor to 80 mg daily Currently on ASA 325 mg Goal BP normotensive gradually Resume diltiazem, Lopressor and Imdur on 06/21/19 PT OT/speech therapist PT OT recommends SNF Speech recommends 24-hour supervision/assistance for cognitive communication deficit Continue Eliquis for permanent A. fib for secondary CVA prevention  Permanent A. fib, not on oral anticoagulation Followed by Dr. Elease HashimotoNahser Was previously on Xarelto, unclear why it was stopped Chads Vascor 7 Restarted on Eliquis on 06/22/2019 Rate controlled Continue p.o. Cardizem and p.o. Lopressor  Essential hypertension Continue diltiazem p.o. 180 mg daily, Imdur 30 mg daily,  Lopressor p.o. 50 mg twice daily   Chronic diastolic CHF Continue to hold off torsemide for now Continue strict I's and O's and daily weight Net I&O -1.7 L Continue to monitor volume status  Resolved leukocytosis Presented with mild leukocytosis WBC  11.2 WBC 9K on 06/19/2019 Blood cultures x2 negative x3 days  Ambulatory dysfunction/wheelchair-bound PT OT recommendations for SNF. Fall precautions CSW assisting with placement  insurance denied Possible peer-to-peer   DVT prophylaxis:  Eliquis Code Status: DNR  Family Communication: None at bedside Disposition Plan:  Home with home health services versus SNF.  Consults called: Neurology    Objective: Vitals:   06/22/19 2341 06/23/19 0433 06/23/19 0746 06/23/19 1229  BP: 128/73 (!) 160/76 (!) 160/90 139/72  Pulse: (!) 53 65 64 60  Resp: Temp: 98.2 F (36.8 C) 98.3 F (36.8 C) 98.2 F (36.8 C) (!) 97.3 F (36.3 C)  TempSrc:  Oral Oral Oral  SpO2: 98% 96% 95% 95%  Weight:        Intake/Output Summary (Last 24 hours) at 06/23/2019 1438 Last data filed at 06/23/2019 1300 Gross per 24 hour  Intake 860 ml  Output 900 ml  Net -40 ml   Filed Weights   06/22/19 1400  Weight: 82.9 kg    Exam:  . General: 85 y.o. year-old female well-developed well-nourished in no acute distress.  Alert and interactive.  Very hard of hearing. . Cardiovascular: Irregular rate and rhythm no rubs or gallops no JVD or thyromegaly noted. Marland Kitchen Respiratory: Clear to auscultation no wheezes or rales. Poor inspiratory effort. . Abdomen: Obese nontender nondistended bowel sounds present. . Musculoskeletal: Trace lower extremity edema.  2 out of 4 pulses in all 4 extremities.   Marland Kitchen Psychiatry: Mood is appropriate for condition and setting.   Data Reviewed: CBC: Recent Labs  Lab 06/18/19 1713 06/19/19 0300 06/19/19 0455  WBC 11.2* 9.2 9.2  HGB 13.0 12.8 12.9  HCT 37.7 37.7 38.0  MCV 89.8 90.0 90.3  PLT 170 164 166   Basic Metabolic Panel: Recent Labs  Lab 06/18/19 1713 06/19/19 0300 06/19/19 0455  NA 134*  --  136  K 3.8  --  3.6  CL 100  --  103  CO2 23  --  25  GLUCOSE 145*  --  119*  BUN 14  --  16  CREATININE 1.22* 1.12* 1.14*  CALCIUM 10.0  --  10.1    GFR: CrCl cannot be calculated (Unknown ideal weight.). Liver Function Tests: Recent Labs  Lab 06/18/19 1713  AST 26  ALT 16  ALKPHOS 108  BILITOT 1.1  PROT 7.1  ALBUMIN 3.8   No results for input(s): LIPASE, AMYLASE in the last 168 hours. Recent Labs  Lab 06/19/19 0547  AMMONIA 37*   Coagulation Profile: No results for input(s): INR, PROTIME in the last 168 hours. Cardiac Enzymes: No results for input(s): CKTOTAL, CKMB, CKMBINDEX, TROPONINI in the last 168 hours. BNP (last 3 results) No results for input(s): PROBNP in the last 8760 hours. HbA1C: No results for input(s): HGBA1C in the last 72 hours. CBG: Recent Labs  Lab 06/18/19 1711  GLUCAP 143*   Lipid Profile: No results for input(s): CHOL, HDL, LDLCALC, TRIG, CHOLHDL, LDLDIRECT in the last 72 hours. Thyroid Function Tests: No results for input(s): TSH, T4TOTAL, FREET4, T3FREE, THYROIDAB in the last 72 hours. Anemia Panel: No results for input(s): VITAMINB12, FOLATE, FERRITIN, TIBC, IRON, RETICCTPCT in the last 72 hours. Urine analysis:  Component Value Date/Time   COLORURINE YELLOW 06/18/2019 2019   APPEARANCEUR CLEAR 06/18/2019 2019   LABSPEC 1.012 06/18/2019 2019   PHURINE 7.0 06/18/2019 2019   GLUCOSEU NEGATIVE 06/18/2019 2019   HGBUR NEGATIVE 06/18/2019 2019   BILIRUBINUR NEGATIVE 06/18/2019 2019   KETONESUR NEGATIVE 06/18/2019 2019   PROTEINUR NEGATIVE 06/18/2019 2019   UROBILINOGEN 0.2 02/07/2015 1534   NITRITE NEGATIVE 06/18/2019 2019   LEUKOCYTESUR NEGATIVE 06/18/2019 2019   Sepsis Labs: @LABRCNTIP (procalcitonin:4,lacticidven:4)  ) Recent Results (from the past 240 hour(s))  SARS CORONAVIRUS 2 (TAT 6-24 HRS) Nasopharyngeal Nasopharyngeal Swab     Status: None   Collection Time: 06/18/19  5:45 PM   Specimen: Nasopharyngeal Swab  Result Value Ref Range Status   SARS Coronavirus 2 NEGATIVE NEGATIVE Final    Comment: (NOTE) SARS-CoV-2 target nucleic acids are NOT DETECTED. The  SARS-CoV-2 RNA is generally detectable in upper and lower respiratory specimens during the acute phase of infection. Negative results do not preclude SARS-CoV-2 infection, do not rule out co-infections with other pathogens, and should not be used as the sole basis for treatment or other patient management decisions. Negative results must be combined with clinical observations, patient history, and epidemiological information. The expected result is Negative. Fact Sheet for Patients: SugarRoll.be Fact Sheet for Healthcare Providers: https://www.woods-mathews.com/ This test is not yet approved or cleared by the [place] FDA and  has been authorized for detection and/or diagnosis of SARS-CoV-2 by FDA under an Emergency Use Authorization (EUA). This EUA will remain  in effect (meaning this test can be used) for the duration of the COVID-19 declaration under Section 56 4(b)(1) of the Act, 21 U.S.C. section 360bbb-3(b)(1), unless the authorization is terminated or revoked sooner. Performed at [facility], [place] [address], [place], [place] [address]   Culture, blood (routine x 2)     Status: None (Preliminary result)   Collection Time: 06/19/19  1:25 AM   Specimen: BLOOD  Result Value Ref Range Status   Specimen Description   Final    BLOOD BLOOD LEFT WRIST Performed at [place] [address] [place], [place] [address]    Special Requests   Final    BOTTLES DRAWN AEROBIC AND ANAEROBIC Blood Culture results may not be optimal due to an inadequate volume of blood received in culture bottles Performed at [facility] [address] [place], [place] [address]    Culture   Final    NO GROWTH 4 DAYS Performed at [facility], [place] [address], [place], [place] [address]    Report Status PENDING  Incomplete  Culture, blood (routine x 2)     Status: None (Preliminary result)   Collection  Time: 06/19/19  1:25 AM   Specimen: BLOOD  Result Value Ref Range Status   Specimen Description   Final    BLOOD BLOOD LEFT HAND Performed at [facility] [address] [place], [place] [address]    Special Requests   Final    BOTTLES DRAWN AEROBIC AND ANAEROBIC Blood Culture results may not be optimal due to an inadequate volume of blood received in culture bottles Performed at [facility] [address], [place], [place] [address]    Culture   Final    NO GROWTH 4 DAYS Performed at [facility], [place] [address], [place], [place] [address]    Report Status PENDING  Incomplete      Studies: No results found.  Scheduled Meds: .  stroke: mapping our early stages of recovery book   Does not apply Once  . apixaban  5 mg Oral BID  . atorvastatin  40 mg Oral Daily  . diltiazem  180 mg Oral Daily  . DULoxetine  60 mg Oral Daily  . isosorbide mononitrate  30 mg Oral Daily  . metoprolol tartrate  50 mg Oral BID  . sodium chloride flush  3 mL Intravenous Once    Continuous Infusions:    LOS: 4 days     Darlin Drop, MD [facility] Pager [phone]  If 7PM-7AM, please contact night-coverage [email or web address] Password [facility] 06/23/2019, 2:38 PM

## 2019-06-24 LAB — CULTURE, BLOOD (ROUTINE X 2)
Culture: NO GROWTH
Culture: NO GROWTH

## 2019-06-24 NOTE — Progress Notes (Signed)
PROGRESS NOTE  Carrie Best:[id number] DOB: Jun 06, 1928 DOA: 06/18/2019 PCP: [name], MD  HPI/Recap of past 24 hours: Carrie Best is a 85 y.o. female with history of atrial fibrillation, diastolic dysfunction, chronic kidney disease stage II, chronically bedbound was brought to the ER after patient was found to be increasingly confused and lethargic over the last 24 hours.  Patient's daughter provided the history.  As per the patient's daughter the night before last patient started become increasingly confused which continued since yesterday morning.  Patient became more lethargic and moved her bowels in a diaper which usually does not do.  Given the ongoing confusion patient was brought to the ER.  ED Course: In the ER patient was initially only oriented to name and gradually started following commands.  CT head shows age indeterminant cerebellar infarct.  ER physician discussed with on-call neurologist Dr. Leonel Ramsay who advised to get MRI brain but to keep patient addressing and also make sure there is no infectious process.  At the time of my exam patient is alert awake oriented to name moving all extremities.  Labs show WBC 11.2 creatinine 1.2 sodium 134 COVID-19 negative.  UA unremarkable.  EKG shows A. fib. With LBBB rate control.  Chest x-ray does not show anything acute.   Acute right parietal occipital CVA.  Seen by neurology and cardiology.  Started on Eliquis on 06/22/2019.  Patient has a history of permanent A. fib, previously on Xarelto, wheelchair bound due to knee issues, unclear why oral anticoagulation was stopped.  PT assessed and recommended SNF.  Insurance denied.  Family appealing.  06/24/19: Patient was seen and examined at bedside this morning.  No acute events overnight.  Very hard of hearing.  She expresses being afraid to get out of bed due to fear to fall.   Assessment/Plan: Principal Problem:   Acute encephalopathy Active Problems:   HTN  (hypertension)   Hyperlipidemia with target LDL less than 70   Atrial fibrillation (HCC)   Chronic diastolic CHF (congestive heart failure) (HCC)   Chronic kidney disease (CKD), stage II (mild)   CVA (cerebral vascular accident) ([place])   Acute ischemic CVA, right parieto-occipital Seen on MRI brain History of permanent A. fib previously on Xarelto, unclear why it was stopped.  Also wheelchair-bound due to knee issues. Per medical records, last seen normal on 06/16/19  Admitted for stroke work-up LDL 66 goal less than 70 A1c 5.9 goal less than 7.0 2D echo normal LVEF 55 to 60% with no PFO. Currently on Lipitor to 80 mg daily Currently on ASA 325 mg Goal BP normotensive gradually Resume diltiazem, Lopressor and Imdur on 06/21/19 PT OT/speech therapist PT OT recommends SNF Speech recommends 24-hour supervision/assistance for cognitive communication deficit Continue Eliquis for permanent A. fib for secondary CVA prevention  Permanent A. fib, not on oral anticoagulation Followed by Dr. Acie Fredrickson Was previously on Xarelto, unclear why it was stopped Chads Vascor 7 Restarted on Eliquis on 06/22/2019 Rate controlled Continue Eliquis, p.o. Cardizem and p.o. Lopressor as recommended by cardiology Follow-up with Dr. Katharina Caper in 3 months  Essential hypertension Continue diltiazem p.o. 180 mg daily, Imdur 30 mg daily, Lopressor p.o. 50 mg twice daily   Chronic diastolic CHF Continue to hold off torsemide for now Continue strict I's and O's and daily weight Net I&O -2.8 L Continue to monitor volume status  Resolved leukocytosis Presented with mild leukocytosis WBC 11.2 WBC 9K on 06/19/2019 Blood cultures x2 negative x3 days  Ambulatory dysfunction/wheelchair-bound  PT OT recommendations for SNF. Fall precautions CSW assisting with placement  insurance denied Possible peer-to-peer   DVT prophylaxis:  Eliquis Code Status: DNR  Family Communication: None at bedside Disposition  Plan:  Home with home health services versus SNF.  Consults called: Neurology    Objective: Vitals:   06/24/19 0508 06/24/19 0557 06/24/19 0800 06/24/19 1116  BP: (!) 175/87  (!) 155/87 (!) 156/84  Pulse: 64  67 70  Resp: 17  18 18   Temp: 98 F (36.7 C)  97.6 F (36.4 C) 97.9 F (36.6 C)  TempSrc:   Oral Oral  SpO2: 94%  98% 98%  Weight:  80.3 kg      Intake/Output Summary (Last 24 hours) at 06/24/2019 1320 Last data filed at 06/24/2019 0900 Gross per 24 hour  Intake 240 ml  Output 1650 ml  Net -1410 ml   Filed Weights   06/22/19 1400 06/24/19 0557  Weight: 82.9 kg 80.3 kg    Exam:  . General: 85 y.o. year-old female well-developed well-nourished alert and interactive.  Very hard of hearing.   . Cardiovascular: Irregular rate and rhythm no rubs or gallops no JVD or thyromegaly noted. Marland Kitchen. Respiratory: Clear to auscultation no wheezes or rales.  Poor inspiratory effort. . Abdomen: Obese nontender nondistended normal bowel sounds present. . Musculoskeletal: Plus lower extremity edema.  2 out of 4 pulses in all 4 extremities. Marland Kitchen. Psychiatry: Mood is appropriate for condition and setting.   Data Reviewed: CBC: Recent Labs  Lab 06/18/19 1713 06/19/19 0300 06/19/19 0455  WBC 11.2* 9.2 9.2  HGB 13.0 12.8 12.9  HCT 37.7 37.7 38.0  MCV 89.8 90.0 90.3  PLT 170 164 166   Basic Metabolic Panel: Recent Labs  Lab 06/18/19 1713 06/19/19 0300 06/19/19 0455  NA 134*  --  136  K 3.8  --  3.6  CL 100  --  103  CO2 23  --  25  GLUCOSE 145*  --  119*  BUN 14  --  16  CREATININE 1.22* 1.12* 1.14*  CALCIUM 10.0  --  10.1   GFR: CrCl cannot be calculated (Unknown ideal weight.). Liver Function Tests: Recent Labs  Lab 06/18/19 1713  AST 26  ALT 16  ALKPHOS 108  BILITOT 1.1  PROT 7.1  ALBUMIN 3.8   No results for input(s): LIPASE, AMYLASE in the last 168 hours. Recent Labs  Lab 06/19/19 0547  AMMONIA 37*   Coagulation Profile: No results for input(s): INR,  PROTIME in the last 168 hours. Cardiac Enzymes: No results for input(s): CKTOTAL, CKMB, CKMBINDEX, TROPONINI in the last 168 hours. BNP (last 3 results) No results for input(s): PROBNP in the last 8760 hours. HbA1C: No results for input(s): HGBA1C in the last 72 hours. CBG: Recent Labs  Lab 06/18/19 1711  GLUCAP 143*   Lipid Profile: No results for input(s): CHOL, HDL, LDLCALC, TRIG, CHOLHDL, LDLDIRECT in the last 72 hours. Thyroid Function Tests: No results for input(s): TSH, T4TOTAL, FREET4, T3FREE, THYROIDAB in the last 72 hours. Anemia Panel: No results for input(s): VITAMINB12, FOLATE, FERRITIN, TIBC, IRON, RETICCTPCT in the last 72 hours. Urine analysis:    Component Value Date/Time   COLORURINE YELLOW 06/18/2019 2019   APPEARANCEUR CLEAR 06/18/2019 2019   LABSPEC 1.012 06/18/2019 2019   PHURINE 7.0 06/18/2019 2019   GLUCOSEU NEGATIVE 06/18/2019 2019   HGBUR NEGATIVE 06/18/2019 2019   BILIRUBINUR NEGATIVE 06/18/2019 2019   KETONESUR NEGATIVE 06/18/2019 2019   PROTEINUR NEGATIVE 06/18/2019 2019  UROBILINOGEN 0.2 02/07/2015 1534   NITRITE NEGATIVE 06/18/2019 2019   LEUKOCYTESUR NEGATIVE 06/18/2019 2019   Sepsis Labs: @LABRCNTIP (procalcitonin:4,lacticidven:4)  ) Recent Results (from the past 240 hour(s))  SARS CORONAVIRUS 2 (TAT 6-24 HRS) Nasopharyngeal Nasopharyngeal Swab     Status: None   Collection Time: 06/18/19  5:45 PM   Specimen: Nasopharyngeal Swab  Result Value Ref Range Status   SARS Coronavirus 2 NEGATIVE NEGATIVE Final    Comment: (NOTE) SARS-CoV-2 target nucleic acids are NOT DETECTED. The SARS-CoV-2 RNA is generally detectable in upper and lower respiratory specimens during the acute phase of infection. Negative results do not preclude SARS-CoV-2 infection, do not rule out co-infections with other pathogens, and should not be used as the sole basis for treatment or other patient management decisions. Negative results must be combined with  clinical observations, patient history, and epidemiological information. The expected result is Negative. Fact Sheet for Patients: 08/18/19 Fact Sheet for Healthcare Providers: HairSlick.no This test is not yet approved or cleared by the [place].com FDA and  has been authorized for detection and/or diagnosis of SARS-CoV-2 by FDA under an Emergency Use Authorization (EUA). This EUA will remain  in effect (meaning this test can be used) for the duration of the COVID-19 declaration under Section 56 4(b)(1) of the Act, 21 U.S.C. section 360bbb-3(b)(1), unless the authorization is terminated or revoked sooner. Performed at [facility], [address], [place], [place]   Culture, blood (routine x 2)     Status: None   Collection Time: 06/19/19  1:25 AM   Specimen: BLOOD  Result Value Ref Range Status   Specimen Description   Final    BLOOD BLOOD LEFT WRIST Performed at [facility], [address] [place], [place]    Special Requests   Final    BOTTLES DRAWN AEROBIC AND ANAEROBIC Blood Culture results may not be optimal due to an inadequate volume of blood received in culture bottles Performed at [facility], [address], [place], [place]    Culture   Final    NO GROWTH 5 DAYS Performed at [facility], [address] [place], [place]    Report Status 06/24/2019 FINAL  Final  Culture, blood (routine x 2)     Status: None   Collection Time: 06/19/19  1:25 AM   Specimen: BLOOD  Result Value Ref Range Status   Specimen Description   Final    BLOOD BLOOD LEFT HAND Performed at [facility], [address] [place], [place]    Special Requests   Final    BOTTLES DRAWN AEROBIC AND ANAEROBIC Blood Culture results may not be optimal due to an inadequate volume of blood received in culture  bottles Performed at [facility], [address], [place], [place]    Culture   Final    NO GROWTH 5 DAYS Performed at [facility], [address] [place], [place]    Report Status 06/24/2019 FINAL  Final      Studies: No results found.  Scheduled Meds: .  stroke: mapping our early stages of recovery book   Does not apply Once  . apixaban  5 mg Oral BID  . atorvastatin  40 mg Oral Daily  . diltiazem  180 mg Oral Daily  . DULoxetine  60 mg Oral Daily  . isosorbide mononitrate  30 mg Oral Daily  .  metoprolol tartrate  50 mg Oral BID  . sodium chloride flush  3 mL Intravenous Once    Continuous Infusions:    LOS: 5 days     Darlin Drop, MD [facility] Pager [phone]  If 7PM-7AM, please contact night-coverage [email or web address] Password [email or web address] 06/24/2019, 1:20 PM

## 2019-06-24 NOTE — Plan of Care (Signed)
  Problem: Education: Goal: Knowledge of General Education information will improve Description: Including pain rating scale, medication(s)/side effects and non-pharmacologic comfort measures Outcome: Progressing   Problem: Health Behavior/Discharge Planning: Goal: Ability to manage health-related needs will improve Outcome: Progressing   Problem: Clinical Measurements: Goal: Ability to maintain clinical measurements within normal limits will improve Outcome: Progressing Goal: Will remain free from infection Outcome: Progressing Goal: Diagnostic test results will improve Outcome: Progressing Goal: Respiratory complications will improve Outcome: Progressing Goal: Cardiovascular complication will be avoided Outcome: Progressing   Problem: Activity: Goal: Risk for activity intolerance will decrease Outcome: Progressing   Problem: Nutrition: Goal: Adequate nutrition will be maintained Outcome: Progressing   Problem: Coping: Goal: Level of anxiety will decrease Outcome: Progressing   Problem: Elimination: Goal: Will not experience complications related to bowel motility Outcome: Progressing Goal: Will not experience complications related to urinary retention Outcome: Progressing   Problem: Pain Managment: Goal: General experience of comfort will improve Outcome: Progressing   Problem: Safety: Goal: Ability to remain free from injury will improve Outcome: Progressing   Problem: Skin Integrity: Goal: Risk for impaired skin integrity will decrease Outcome: Progressing   Problem: Education: Goal: Knowledge of secondary prevention will improve Outcome: Progressing Goal: Knowledge of patient specific risk factors addressed and post discharge goals established will improve Outcome: Progressing Goal: Individualized Educational Video(s) Outcome: Progressing   Jorian [place]ho[place], BSN, RN 

## 2019-06-25 LAB — BASIC METABOLIC PANEL
Anion gap: 9 (ref 5–15)
BUN: 7 mg/dL — ABNORMAL LOW (ref 8–23)
CO2: 26 mmol/L (ref 22–32)
Calcium: 9.5 mg/dL (ref 8.9–10.3)
Chloride: 102 mmol/L (ref 98–111)
Creatinine, Ser: 0.92 mg/dL (ref 0.44–1.00)
GFR calc Af Amer: >60 mL/min (ref >60)
GFR calc non Af Amer: 55 mL/min — ABNORMAL LOW (ref >60)
Glucose, Bld: 102 mg/dL — ABNORMAL HIGH (ref 70–99)
Potassium: 3.2 mmol/L — ABNORMAL LOW (ref 3.5–5.1)
Sodium: 137 mmol/L (ref 135–145)

## 2019-06-25 LAB — CBC
HCT: 39.3 % (ref 36.0–46.0)
Hemoglobin: 13.8 g/dL (ref 12.0–15.0)
MCH: 30.8 pg (ref 26.0–34.0)
MCHC: 35.1 g/dL (ref 30.0–36.0)
MCV: 87.7 fL (ref 80.0–100.0)
Platelets: 193 10*3/uL (ref 150–400)
RBC: 4.48 MIL/uL (ref 3.87–5.11)
RDW: 13.9 % (ref 11.5–15.5)
WBC: 9.3 10*3/uL (ref 4.0–10.5)
nRBC: 0 % (ref 0.0–0.2)

## 2019-06-25 LAB — SARS CORONAVIRUS 2 (TAT 6-24 HRS): SARS Coronavirus 2: NEGATIVE

## 2019-06-25 MED ORDER — HYDRALAZINE HCL 10 MG PO TABS
10.0000 mg | ORAL_TABLET | Freq: Two times a day (BID) | ORAL | Status: DC
  Administered 2019-06-25 – 2019-06-26 (×3): 10 mg via ORAL
  Filled 2019-06-25 (×4): qty 1

## 2019-06-25 MED ORDER — POTASSIUM CHLORIDE CRYS ER 20 MEQ PO TBCR
40.0000 meq | EXTENDED_RELEASE_TABLET | Freq: Two times a day (BID) | ORAL | Status: AC
  Administered 2019-06-25 (×2): 40 meq via ORAL
  Filled 2019-06-25 (×2): qty 2

## 2019-06-25 MED ORDER — MAGNESIUM SULFATE 2 GM/50ML IV SOLN
2.0000 g | Freq: Once | INTRAVENOUS | Status: AC
  Administered 2019-06-25: 2 g via INTRAVENOUS
  Filled 2019-06-25: qty 50

## 2019-06-25 NOTE — Progress Notes (Signed)
Potassium 3.2, Dr. Silas [place] informed.

## 2019-06-25 NOTE — TOC Progression Note (Signed)
Transition of Care [facility]) - Progression Note    Patient Details  Name: NYASIAH MOFFET MRN: [id number] Date of Birth: 1927-10-18  Transition [facility] Care [facility] CM/SW Contact  Pollie Friar, RN Phone Number: 06/25/2019, 10:04 AM  Clinical Narrative: Patient has won the appeal for SNF rehab under her [facility]. Authorization is good until 10/21. Number: [id number] [facility] still has a bed to offer her. Just need updated Covid for d/c to SNF rehab. TOC following.   Expected Discharge Plan: [place] Barriers to Discharge: Continued Medical Work up, SNF Pending bed offer  Expected Discharge Plan and Services Expected Discharge Plan: [place] In-house Referral: Clinical Social Work Discharge Planning Services: CM Consult Post Acute Care Choice: [place] arrangements for the past 2 months: Single Family Home                                       Social Determinants of Health (SDOH) Interventions    Readmission Risk Interventions No flowsheet data found.

## 2019-06-25 NOTE — Progress Notes (Signed)
PROGRESS NOTE  Carrie Best:[id number] DOB: 1928/08/16 DOA: 06/18/2019 PCP: Kirby Funk, MD  HPI/Recap of past 24 hours: Carrie Best is a 85 y.o. female with history of atrial fibrillation, diastolic dysfunction, chronic kidney disease stage II, chronically bedbound was brought to the ER after patient was found to be increasingly confused and lethargic over the last 24 hours.  Patient's daughter provided the history.  As per the patient's daughter the night before last patient started become increasingly confused which continued since yesterday morning.  Patient became more lethargic and moved her bowels in a diaper which usually does not do.  Given the ongoing confusion patient was brought to the ER.  ED Course: In the ER patient was initially only oriented to name and gradually started following commands.  CT head shows age indeterminant cerebellar infarct.  ER physician discussed with on-call neurologist Dr. Amada Jupiter who advised to get MRI brain but to keep patient addressing and also make sure there is no infectious process.  At the time of my exam patient is alert awake oriented to name moving all extremities.  Labs show WBC 11.2 creatinine 1.2 sodium 134 COVID-19 negative.  UA unremarkable.  EKG shows A. fib. With LBBB rate control.  Chest x-ray does not show anything acute.   Acute right parietal occipital CVA.  Seen by neurology and cardiology.  Started on Eliquis on 06/22/2019.  Patient has a history of permanent A. fib, previously on Xarelto, wheelchair bound due to knee issues, unclear why oral anticoagulation was stopped.  PT assessed and recommended SNF.  Insurance denied.  Family appealing.  06/25/19: Patient was seen and examined at her bedside this morning.  No acute events overnight.  Very hard of hearing.  Vital signs and labs reviewed and are stable.  Awaiting bed placement.  CSW assisting.   Assessment/Plan: Principal Problem:   Acute  encephalopathy Active Problems:   HTN (hypertension)   Hyperlipidemia with target LDL less than 70   Atrial fibrillation (HCC)   Chronic diastolic CHF (congestive heart failure) (HCC)   Chronic kidney disease (CKD), stage II (mild)   CVA (cerebral vascular accident) (HCC)   Acute ischemic CVA, right parieto-occipital Seen on MRI brain History of permanent A. fib previously on Xarelto, unclear why it was stopped.  Also wheelchair-bound due to knee issues. Per medical records, last seen normal on 06/16/19  Admitted for stroke work-up LDL 66 goal less than 70 A1c 5.9 goal less than 7.0 2D echo normal LVEF 55 to 60% with no PFO. Currently on Lipitor to 80 mg daily Currently on ASA 325 mg Goal BP normotensive gradually Resume diltiazem, Lopressor and Imdur on 06/21/19 BP not at goal Added po hydralazine 10 mg BID on 06/25/19 PT OT recommends SNF Speech recommends 24-hour supervision/assistance for cognitive communication deficit Continue Eliquis for permanent A. fib for secondary CVA prevention  Permanent A. fib, not on oral anticoagulation Followed by Dr. Elease Hashimoto Was previously on Xarelto, unclear why it was stopped Chads Vascor 7 Restarted on Eliquis on 06/22/2019 Rate controlled Continue Eliquis, p.o. Cardizem and p.o. Lopressor as recommended by cardiology Follow-up with Dr. Lourena Simmonds in 3 months  Uncontrolled hypertension BP is not at goal Continue diltiazem p.o. 180 mg daily, Imdur 30 mg daily, Lopressor p.o. 50 mg twice daily  Added po hydralazine 10 mg BID Continue to monitor vital signs.  Chronic diastolic CHF Continue to hold off torsemide for now Continue strict I's and O's and daily weight Net I&O -4.0L Continue  to monitor volume status  Resolved leukocytosis Presented with mild leukocytosis WBC 11.2 WBC 9K on 06/19/2019 Blood cultures x2 negative x3 days  Ambulatory dysfunction/wheelchair-bound PT OT recommendations for SNF. Covid screening ordered on  06/25/19 Fall precautions CSW assisting with placement Insurance accepted   DVT prophylaxis:  Eliquis Code Status: DNR  Family Communication: None at bedside.   Disposition Plan:  SNF when bed is available.   Consults called: Neurology    Objective: Vitals:   06/25/19 0318 06/25/19 0328 06/25/19 0605 06/25/19 0800  BP: (!) 173/75  (!) 172/80 132/65  Pulse: 64  63 77  Resp: 18   18  Temp: 98.6 F (37 C)   97.8 F (36.6 C)  TempSrc: Oral   Oral  SpO2: 97%   100%  Weight:  79.8 kg      Intake/Output Summary (Last 24 hours) at 06/25/2019 1257 Last data filed at 06/25/2019 0941 Gross per 24 hour  Intake 240 ml  Output 1800 ml  Net -1560 ml   Filed Weights   06/22/19 1400 06/24/19 0557 06/25/19 0328  Weight: 82.9 kg 80.3 kg 79.8 kg    Exam:  . General: 85 y.o. year-old female well-developed well-nourished in no acute distress.  Very hard of hearing.  Alert and interactive. . Cardiovascular: Irregular rate and rhythm no rubs or gallops noted.  Marland Kitchen Respiratory: Clear to auscultation no wheezes or rales.  Poor respiratory effort.   . Abdomen: Obese nontender nondistended normal bowel sounds.  . Musculoskeletal: No lower extremity edema.  Palpable pulses in all 4 extremities.   Marland Kitchen Psychiatry: Mood is appropriate for condition and setting.   Data Reviewed: CBC: Recent Labs  Lab 06/18/19 1713 06/19/19 0300 06/19/19 0455 06/25/19 0348  WBC 11.2* 9.2 9.2 9.3  HGB 13.0 12.8 12.9 13.8  HCT 37.7 37.7 38.0 39.3  MCV 89.8 90.0 90.3 87.7  PLT 170 164 166 193   Basic Metabolic Panel: Recent Labs  Lab 06/18/19 1713 06/19/19 0300 06/19/19 0455 06/25/19 0348  NA 134*  --  136 137  K 3.8  --  3.6 3.2*  CL 100  --  103 102  CO2 23  --  25 26  GLUCOSE 145*  --  119* 102*  BUN 14  --  16 7*  CREATININE 1.22* 1.12* 1.14* 0.92  CALCIUM 10.0  --  10.1 9.5   GFR: CrCl cannot be calculated (Unknown ideal weight.). Liver Function Tests: Recent Labs  Lab  06/18/19 1713  AST 26  ALT 16  ALKPHOS 108  BILITOT 1.1  PROT 7.1  ALBUMIN 3.8   No results for input(s): LIPASE, AMYLASE in the last 168 hours. Recent Labs  Lab 06/19/19 0547  AMMONIA 37*   Coagulation Profile: No results for input(s): INR, PROTIME in the last 168 hours. Cardiac Enzymes: No results for input(s): CKTOTAL, CKMB, CKMBINDEX, TROPONINI in the last 168 hours. BNP (last 3 results) No results for input(s): PROBNP in the last 8760 hours. HbA1C: No results for input(s): HGBA1C in the last 72 hours. CBG: Recent Labs  Lab 06/18/19 1711  GLUCAP 143*   Lipid Profile: No results for input(s): CHOL, HDL, LDLCALC, TRIG, CHOLHDL, LDLDIRECT in the last 72 hours. Thyroid Function Tests: No results for input(s): TSH, T4TOTAL, FREET4, T3FREE, THYROIDAB in the last 72 hours. Anemia Panel: No results for input(s): VITAMINB12, FOLATE, FERRITIN, TIBC, IRON, RETICCTPCT in the last 72 hours. Urine analysis:    Component Value Date/Time   COLORURINE YELLOW 06/18/2019 2019  APPEARANCEUR CLEAR 06/18/2019 2019   LABSPEC 1.012 06/18/2019 2019   PHURINE 7.0 06/18/2019 2019   GLUCOSEU NEGATIVE 06/18/2019 2019   HGBUR NEGATIVE 06/18/2019 2019   BILIRUBINUR NEGATIVE 06/18/2019 2019   KETONESUR NEGATIVE 06/18/2019 2019   PROTEINUR NEGATIVE 06/18/2019 2019   UROBILINOGEN 0.2 02/07/2015 1534   NITRITE NEGATIVE 06/18/2019 2019   LEUKOCYTESUR NEGATIVE 06/18/2019 2019   Sepsis Labs: (procalcitonin:4,lacticidven:4)  ) Recent Results (from the past 240 hour(s))  SARS CORONAVIRUS 2 (TAT 6-24 HRS) Nasopharyngeal Nasopharyngeal Swab     Status: None   Collection Time: 06/18/19  5:45 PM   Specimen: Nasopharyngeal Swab  Result Value Ref Range Status   SARS Coronavirus 2 NEGATIVE NEGATIVE Final    Comment: (NOTE) SARS-CoV-2 target nucleic acids are NOT DETECTED. The SARS-CoV-2 RNA is generally detectable in upper and lower respiratory specimens during the acute phase of  infection. Negative results do not preclude SARS-CoV-2 infection, do not rule out co-infections with other pathogens, and should not be used as the sole basis for treatment or other patient management decisions. Negative results must be combined with clinical observations, patient history, and epidemiological information. The expected result is Negative. Fact Sheet for Patients: HairSlick.no Fact Sheet for Healthcare Providers: quierodirigir.com This test is not yet approved or cleared by the [place] FDA and  has been authorized for detection and/or diagnosis of SARS-CoV-2 by FDA under an Emergency Use Authorization (EUA). This EUA will remain  in effect (meaning this test can be used) for the duration of the COVID-19 declaration under Section 56 4(b)(1) of the Act, 21 U.S.C. section 360bbb-3(b)(1), unless the authorization is terminated or revoked sooner. Performed at [facility], [address], [place], [place] [address]   Culture, blood (routine x 2)     Status: None   Collection Time: 06/19/19  1:25 AM   Specimen: BLOOD  Result Value Ref Range Status   Specimen Description   Final    BLOOD BLOOD LEFT WRIST Performed at [facility], [address] [place], [place] [address]    Special Requests   Final    BOTTLES DRAWN AEROBIC AND ANAEROBIC Blood Culture results may not be optimal due to an inadequate volume of blood received in culture bottles Performed at [facility], [address] [place], [place] [address]    Culture   Final    NO GROWTH 5 DAYS Performed at [facility], [address], [place], [place] [address]    Report Status 06/24/2019 FINAL  Final  Culture, blood (routine x 2)     Status: None   Collection Time: 06/19/19  1:25 AM   Specimen: BLOOD  Result Value Ref Range Status   Specimen Description   Final    BLOOD BLOOD LEFT HAND Performed at  [facility], [address] [place], [place] [address]    Special Requests   Final    BOTTLES DRAWN AEROBIC AND ANAEROBIC Blood Culture results may not be optimal due to an inadequate volume of blood received in culture bottles Performed at [facility], [address], [place], [place] [address]    Culture   Final    NO GROWTH 5 DAYS Performed at [facility], [address], [place], [place] [address]    Report Status 06/24/2019 FINAL  Final      Studies: No results found.  Scheduled Meds: .  stroke: mapping our early stages of recovery book  Does not apply Once  . apixaban  5 mg Oral BID  . atorvastatin  40 mg Oral Daily  . diltiazem  180 mg Oral Daily  . DULoxetine  60 mg Oral Daily  . hydrALAZINE  10 mg Oral BID  . isosorbide mononitrate  30 mg Oral Daily  . metoprolol tartrate  50 mg Oral BID  . potassium chloride  40 mEq Oral BID  . sodium chloride flush  3 mL Intravenous Once    Continuous Infusions:    LOS: 6 days     Darlin Drop, MD [facility] Pager [phone]  If 7PM-7AM, please contact night-coverage [email or web address] Password [email or web address] 06/25/2019, 12:57 PM    PROGRESS NOTE  Carrie Best DGU:[id number] DOB: Dec 19, 1927 DOA: 06/18/2019 PCP: Kirby Funk, MD  HPI/Recap of past 24 hours: ELLASYN SWILLING is a 85 y.o. female with history of atrial fibrillation, diastolic dysfunction, chronic kidney disease stage II, chronically bedbound was brought to the ER after patient was found to be increasingly confused and lethargic over the last 24 hours.  Patient's daughter provided the history.  As per the patient's daughter the night before last patient started become increasingly confused which continued since yesterday morning.  Patient became more lethargic and moved her bowels in a diaper which usually does not do.  Given the ongoing confusion patient was brought to the ER.  ED Course: In the ER patient  was initially only oriented to name and gradually started following commands.  CT head shows age indeterminant cerebellar infarct.  ER physician discussed with on-call neurologist Dr. Amada Jupiter who advised to get MRI brain but to keep patient addressing and also make sure there is no infectious process.  At the time of my exam patient is alert awake oriented to name moving all extremities.  Labs show WBC 11.2 creatinine 1.2 sodium 134 COVID-19 negative.  UA unremarkable.  EKG shows A. fib. With LBBB rate control.  Chest x-ray does not show anything acute.   Acute right parietal occipital CVA.  Seen by neurology and cardiology.  Started on Eliquis on 06/22/2019.  Patient has a history of permanent A. fib, previously on Xarelto, wheelchair bound due to knee issues, unclear why oral anticoagulation was stopped.  PT assessed and recommended SNF.  Insurance denied.  Family appealing.  06/24/19: Patient was seen and examined at bedside this morning.  No acute events overnight.  Very hard of hearing.  She expresses being afraid to get out of bed due to fear to fall.   Assessment/Plan: Principal Problem:   Acute encephalopathy Active Problems:   HTN (hypertension)   Hyperlipidemia with target LDL less than 70   Atrial fibrillation (HCC)   Chronic diastolic CHF (congestive heart failure) (HCC)   Chronic kidney disease (CKD), stage II (mild)   CVA (cerebral vascular accident) (HCC)   Acute ischemic CVA, right parieto-occipital Seen on MRI brain History of permanent A. fib previously on Xarelto, unclear why it was stopped.  Also wheelchair-bound due to knee issues. Per medical records, last seen normal on 06/16/19  Admitted for stroke work-up LDL 66 goal less than 70 A1c 5.9 goal less than 7.0 2D echo normal LVEF 55 to 60% with no PFO. Currently on Lipitor to 80 mg daily Currently on ASA 325 mg Goal BP normotensive gradually Resume diltiazem, Lopressor and Imdur on 06/21/19 PT OT/speech  therapist PT OT recommends SNF Speech recommends 24-hour supervision/assistance for cognitive communication deficit Continue Eliquis for permanent A. fib for secondary CVA prevention  Permanent A. fib, not on oral anticoagulation Followed by Dr. Acie Fredrickson Was previously on Xarelto, unclear why it was stopped Chads Vascor 7 Restarted on Eliquis on 06/22/2019 Rate controlled Continue Eliquis, p.o. Cardizem and p.o. Lopressor as recommended by cardiology Follow-up with Dr. Katharina Caper in 3 months  Essential hypertension Continue diltiazem p.o. 180 mg daily, Imdur 30 mg daily, Lopressor p.o. 50 mg twice daily   Chronic diastolic CHF Continue to hold off torsemide for now Continue strict I's and O's and daily weight Net I&O -2.8 L Continue to monitor volume status  Resolved leukocytosis Presented with mild leukocytosis WBC 11.2 WBC 9K on 06/19/2019 Blood cultures x2 negative x3 days  Ambulatory dysfunction/wheelchair-bound PT OT recommendations for SNF. Fall precautions CSW assisting with placement  insurance denied Possible peer-to-peer   DVT prophylaxis:  Eliquis Code Status: DNR  Family Communication: None at bedside Disposition Plan:  Home with home health services versus SNF.  Consults called: Neurology    Objective: Vitals:   06/25/19 0318 06/25/19 0328 06/25/19 0605 06/25/19 0800  BP: (!) 173/75  (!) 172/80 132/65  Pulse: 64  63 77  Resp: 18   18  Temp: 98.6 F (37 C)   97.8 F (36.6 C)  TempSrc: Oral   Oral  SpO2: 97%   100%  Weight:  79.8 kg      Intake/Output Summary (Last 24 hours) at 06/25/2019 1257 Last data filed at 06/25/2019 0941 Gross per 24 hour  Intake 240 ml  Output 1800 ml  Net -1560 ml   Filed Weights   06/22/19 1400 06/24/19 0557 06/25/19 0328  Weight: 82.9 kg 80.3 kg 79.8 kg    Exam:  . General: 85 y.o. year-old female well-developed well-nourished alert and interactive.  Very hard of hearing.   . Cardiovascular: Irregular rate and  rhythm no rubs or gallops no JVD or thyromegaly noted. Marland Kitchen Respiratory: Clear to auscultation no wheezes or rales.  Poor inspiratory effort. . Abdomen: Obese nontender nondistended normal bowel sounds present. . Musculoskeletal: Plus lower extremity edema.  2 out of 4 pulses in all 4 extremities. Marland Kitchen Psychiatry: Mood is appropriate for condition and setting.   Data Reviewed: CBC: Recent Labs  Lab 06/18/19 1713 06/19/19 0300 06/19/19 0455 06/25/19 0348  WBC 11.2* 9.2 9.2 9.3  HGB 13.0 12.8 12.9 13.8  HCT 37.7 37.7 38.0 39.3  MCV 89.8 90.0 90.3 87.7  PLT 170 164 166 244   Basic Metabolic Panel: Recent Labs  Lab 06/18/19 1713 06/19/19 0300 06/19/19 0455 06/25/19 0348  NA 134*  --  136 137  K 3.8  --  3.6 3.2*  CL 100  --  103 102  CO2 23  --  25 26  GLUCOSE 145*  --  119* 102*  BUN 14  --  16 7*  CREATININE 1.22* 1.12* 1.14* 0.92  CALCIUM 10.0  --  10.1 9.5   GFR: CrCl cannot be calculated (Unknown ideal weight.). Liver Function Tests: Recent Labs  Lab 06/18/19 1713  AST 26  ALT 16  ALKPHOS 108  BILITOT 1.1  PROT 7.1  ALBUMIN 3.8   No results for input(s): LIPASE, AMYLASE in the last 168 hours. Recent Labs  Lab 06/19/19 0547  AMMONIA 37*   Coagulation Profile: No results for input(s): INR, PROTIME in the last 168 hours. Cardiac Enzymes: No results for input(s): CKTOTAL, CKMB, CKMBINDEX, TROPONINI in the last 168 hours. BNP (last 3 results) No results for input(s): PROBNP in the last 8760 hours. HbA1C: No results  for input(s): HGBA1C in the last 72 hours. CBG: Recent Labs  Lab 06/18/19 1711  GLUCAP 143*   Lipid Profile: No results for input(s): CHOL, HDL, LDLCALC, TRIG, CHOLHDL, LDLDIRECT in the last 72 hours. Thyroid Function Tests: No results for input(s): TSH, T4TOTAL, FREET4, T3FREE, THYROIDAB in the last 72 hours. Anemia Panel: No results for input(s): VITAMINB12, FOLATE, FERRITIN, TIBC, IRON, RETICCTPCT in the last 72 hours. Urine analysis:     Component Value Date/Time   COLORURINE YELLOW 06/18/2019 2019   APPEARANCEUR CLEAR 06/18/2019 2019   LABSPEC 1.012 06/18/2019 2019   PHURINE 7.0 06/18/2019 2019   GLUCOSEU NEGATIVE 06/18/2019 2019   HGBUR NEGATIVE 06/18/2019 2019   BILIRUBINUR NEGATIVE 06/18/2019 2019   KETONESUR NEGATIVE 06/18/2019 2019   PROTEINUR NEGATIVE 06/18/2019 2019   UROBILINOGEN 0.2 02/07/2015 1534   NITRITE NEGATIVE 06/18/2019 2019   LEUKOCYTESUR NEGATIVE 06/18/2019 2019   Sepsis Labs: @LABRCNTIP (procalcitonin:4,lacticidven:4)  ) Recent Results (from the past 240 hour(s))  SARS CORONAVIRUS 2 (TAT 6-24 HRS) Nasopharyngeal Nasopharyngeal Swab     Status: None   Collection Time: 06/18/19  5:45 PM   Specimen: Nasopharyngeal Swab  Result Value Ref Range Status   SARS Coronavirus 2 NEGATIVE NEGATIVE Final    Comment: (NOTE) SARS-CoV-2 target nucleic acids are NOT DETECTED. The SARS-CoV-2 RNA is generally detectable in upper and lower respiratory specimens during the acute phase of infection. Negative results do not preclude SARS-CoV-2 infection, do not rule out co-infections with other pathogens, and should not be used as the sole basis for treatment or other patient management decisions. Negative results must be combined with clinical observations, patient history, and epidemiological information. The expected result is Negative. Fact Sheet for Patients: HairSlick.nohttps://www.fda.gov/media/138098/download Fact Sheet for Healthcare Providers: quierodirigir.comhttps://www.fda.gov/media/138095/download This test is not yet approved or cleared by the [place] FDA and  has been authorized for detection and/or diagnosis of SARS-CoV-2 by FDA under an Emergency Use Authorization (EUA). This EUA will remain  in effect (meaning this test can be used) for the duration of the COVID-19 declaration under Section 56 4(b)(1) of the Act, 21 U.S.C. section 360bbb-3(b)(1), unless the authorization is terminated or revoked  sooner. Performed at [facility], [address] [place], [place] [address]   Culture, blood (routine x 2)     Status: None   Collection Time: 06/19/19  1:25 AM   Specimen: BLOOD  Result Value Ref Range Status   Specimen Description   Final    BLOOD BLOOD LEFT WRIST Performed at [facility], [address] [place], [place] [address]    Special Requests   Final    BOTTLES DRAWN AEROBIC AND ANAEROBIC Blood Culture results may not be optimal due to an inadequate volume of blood received in culture bottles Performed at [facility], [address] [place], [place] [address]    Culture   Final    NO GROWTH 5 DAYS Performed at [facility], [address] [place], [place] [address]    Report Status 06/24/2019 FINAL  Final  Culture, blood (routine x 2)     Status: None   Collection Time: 06/19/19  1:25 AM   Specimen: BLOOD  Result Value Ref Range Status   Specimen Description   Final    BLOOD BLOOD LEFT HAND Performed at [facility], [address] [place], [place] [address]    Special Requests   Final    BOTTLES DRAWN AEROBIC AND ANAEROBIC Blood Culture  results may not be optimal due to an inadequate volume of blood received in culture bottles Performed at [facility], [address] [place], [place] [address]    Culture   Final    NO GROWTH 5 DAYS Performed at [facility], [address] [place], [place] [address]    Report Status 06/24/2019 FINAL  Final      Studies: No results found.  Scheduled Meds: .  stroke: mapping our early stages of recovery book   Does not apply Once  . apixaban  5 mg Oral BID  . atorvastatin  40 mg Oral Daily  . diltiazem  180 mg Oral Daily  . DULoxetine  60 mg Oral Daily  . hydrALAZINE  10 mg Oral BID  . isosorbide mononitrate  30 mg Oral Daily  . metoprolol tartrate  50 mg Oral BID  . potassium chloride  40 mEq Oral BID  . sodium  chloride flush  3 mL Intravenous Once    Continuous Infusions:    LOS: 6 days     Darlin Drop, MD [facility] Pager [phone]  If 7PM-7AM, please contact night-coverage [email or web address] Password [email or web address] 06/25/2019, 12:57 PM

## 2019-06-25 NOTE — Plan of Care (Signed)
  Problem: Education: Goal: Knowledge of General Education information will improve Description: Including pain rating scale, medication(s)/side effects and non-pharmacologic comfort measures Outcome: Progressing   Problem: Health Behavior/Discharge Planning: Goal: Ability to manage health-related needs will improve Outcome: Progressing   Problem: Clinical Measurements: Goal: Ability to maintain clinical measurements within normal limits will improve Outcome: Progressing Goal: Will remain free from infection Outcome: Progressing Goal: Diagnostic test results will improve Outcome: Progressing Goal: Respiratory complications will improve Outcome: Progressing Goal: Cardiovascular complication will be avoided Outcome: Progressing   Problem: Activity: Goal: Risk for activity intolerance will decrease Outcome: Progressing   Problem: Nutrition: Goal: Adequate nutrition will be maintained Outcome: Progressing   Problem: Coping: Goal: Level of anxiety will decrease Outcome: Progressing   Problem: Elimination: Goal: Will not experience complications related to bowel motility Outcome: Progressing Goal: Will not experience complications related to urinary retention Outcome: Progressing   Problem: Pain Managment: Goal: General experience of comfort will improve Outcome: Progressing   Problem: Safety: Goal: Ability to remain free from injury will improve Outcome: Progressing   Problem: Skin Integrity: Goal: Risk for impaired skin integrity will decrease Outcome: Progressing   Problem: Education: Goal: Knowledge of secondary prevention will improve Outcome: Progressing Goal: Knowledge of patient specific risk factors addressed and post discharge goals established will improve Outcome: Progressing Goal: Individualized Educational Video(s) Outcome: Progressing   Ival Bible, BSN, RN

## 2019-06-25 NOTE — Progress Notes (Signed)
Physical Therapy Treatment Patient Details Name: Carrie Best MRN: [id number] DOB: 02/05/1928 Today's Date: 06/25/2019    History of Present Illness Carrie Best is a 85 y.o. 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  with history of atrial fibrillation, diastolic dysfunction, chronic kidney disease stage II, chronically wheelchair bound.    PT Comments    Pt extremely HOH. Pt continues to require modAx2 for std pvt transfer. Unable to advance L LE due to unable to tolerate R LE WBing due to fear. Pt slides L LE but can advance R LE. Pt was able to complete std pvt transfers to/from w/c to/from bed/toliet however now requiring modAx2. Acute PT to cont to follow. Recommend ST-SNF upon d/c to achieve PLOF for safe transition home.   Follow Up Recommendations  SNF     Equipment Recommendations  None recommended by PT    Recommendations for Other Services       Precautions / Restrictions Precautions Precautions: Fall Precaution Comments: R knee deformity s/p TKA 2 years ago Restrictions Weight Bearing Restrictions: No    Mobility  Bed Mobility Overal bed mobility: Needs Assistance Bed Mobility: Supine to Sit     Supine to sit: Supervision;HOB elevated     General bed mobility comments: pt able to bring LEs off EOB and raise self up  Transfers Overall transfer level: Needs assistance Equipment used: 2 person hand held assist Transfers: Sit to/from [employer] Sit to Stand: Min assist;+2 physical assistance Stand pivot transfers: Min assist;+2 physical assistance;Mod assist       General transfer comment: pt typically uses bed rails on bed, PT and tech used HHA to mimic rails and pt able to transfer only to the R, instructed RN staff to turn chair so she transfers back to bed going towards the R  Ambulation/Gait             General Gait Details: attempted to shift weight and march in place however pt unable  to tolerate/increased fear with WBing on the R LE and is unable to lift L LE to advance    Stairs             Wheelchair Mobility    Modified Rankin (Stroke Patients Only) Modified Rankin (Stroke Patients Only) Pre-Morbid Rankin Score: Moderately severe disability Modified Rankin: Moderately severe disability     Balance Overall balance assessment: Needs assistance Sitting-balance support: Feet supported Sitting balance-Leahy Scale: Good     Standing balance support: Bilateral upper extremity supported Standing balance-Leahy Scale: Poor Standing balance comment: minA for standing balance with hand hold and support of recliner                            Cognition Arousal/Alertness: Awake/alert Behavior During Therapy: WFL for tasks assessed/performed Overall Cognitive Status: Impaired/Different from baseline Area of Impairment: Problem solving                             Problem Solving: Slow processing;Difficulty sequencing;Requires verbal cues;Requires tactile cues General Comments: pt extremely hard of hearing which likely contributes to delayed processing      Exercises      General Comments General comments (skin integrity, edema, etc.): VSS      Pertinent Vitals/Pain Pain Assessment: No/denies pain    Home Living  Prior Function            PT Goals (current goals can now be found in the care plan section) Progress towards PT goals: Progressing toward goals    Frequency    Min 3X/week      PT Plan Current plan remains appropriate;Frequency needs to be updated    Co-evaluation              AM-PAC PT "6 Clicks" Mobility   Outcome Measure  Help needed turning from your back to your side while in a flat bed without using bedrails?: None Help needed moving from lying on your back to sitting on the side of a flat bed without using bedrails?: None Help needed moving to and from a bed  to a chair (including a wheelchair)?: A Little Help needed standing up from a chair using your arms (e.g., wheelchair or bedside chair)?: A Little Help needed to walk in hospital room?: Total Help needed climbing 3-5 steps with a railing? : Total 6 Click Score: 16    End of Session Equipment Utilized During Treatment: Gait belt Activity Tolerance: Patient tolerated treatment well Patient left: in chair;with call bell/phone within reach;with chair alarm set Nurse Communication: Mobility status PT Visit Diagnosis: Other symptoms and signs involving the nervous system (R29.898)     Time: 1238-1300 PT Time Calculation (min) (ACUTE ONLY): 22 min  Charges:  $Therapeutic Activity: 8-22 mins                     Kittie Plater, PT, DPT Acute Rehabilitation Services Pager #: [phone] Office #: [phone]    Berline Lopes 06/25/2019, 2:55 PM

## 2019-06-25 NOTE — Care Management Important Message (Signed)
Important Message  Patient Details  Name: Carrie Best MRN: [id number] Date of Birth: 05-23-1928   Medicare Important Message Given:  Yes     Nicklas Mcsweeney 06/25/2019, 3:16 PM

## 2019-06-26 LAB — BASIC METABOLIC PANEL
Anion gap: 7 (ref 5–15)
BUN: 11 mg/dL (ref 8–23)
CO2: 29 mmol/L (ref 22–32)
Calcium: 10.2 mg/dL (ref 8.9–10.3)
Chloride: 100 mmol/L (ref 98–111)
Creatinine, Ser: 1.09 mg/dL — ABNORMAL HIGH (ref 0.44–1.00)
GFR calc Af Amer: 52 mL/min — ABNORMAL LOW (ref >60)
GFR calc non Af Amer: 45 mL/min — ABNORMAL LOW (ref >60)
Glucose, Bld: 105 mg/dL — ABNORMAL HIGH (ref 70–99)
Potassium: 5.1 mmol/L (ref 3.5–5.1)
Sodium: 136 mmol/L (ref 135–145)

## 2019-06-26 LAB — MAGNESIUM: Magnesium: 2.4 mg/dL (ref 1.7–2.4)

## 2019-06-26 MED ORDER — SENNOSIDES-DOCUSATE SODIUM 8.6-50 MG PO TABS
2.0000 | ORAL_TABLET | Freq: Two times a day (BID) | ORAL | Status: DC
  Administered 2019-06-26: 2 via ORAL
  Filled 2019-06-26: qty 2

## 2019-06-26 MED ORDER — HYDRALAZINE HCL 10 MG PO TABS: 10.0000 mg | ORAL_TABLET | Freq: Two times a day (BID) | ORAL | 0 refills | Status: DC

## 2019-06-26 MED ORDER — STROKE: EARLY STAGES OF RECOVERY BOOK: 1.0000 | Freq: Once | 0 refills | Status: AC

## 2019-06-26 MED ORDER — BISACODYL 10 MG RE SUPP
10.0000 mg | Freq: Once | RECTAL | Status: AC
  Administered 2019-06-26: 10 mg via RECTAL
  Filled 2019-06-26: qty 1

## 2019-06-26 MED ORDER — POLYETHYLENE GLYCOL 3350 17 G PO PACK
17.0000 g | PACK | Freq: Every day | ORAL | Status: DC
  Administered 2019-06-26: 17 g via ORAL
  Filled 2019-06-26: qty 1

## 2019-06-26 MED ORDER — FLEET ENEMA 7-19 GM/118ML RE ENEM: 1.0000 | ENEMA | Freq: Every day | RECTAL | Status: DC | PRN

## 2019-06-26 MED ORDER — ATORVASTATIN CALCIUM 40 MG PO TABS: 40.0000 mg | ORAL_TABLET | Freq: Every day | ORAL | 0 refills | Status: AC

## 2019-06-26 MED ORDER — APIXABAN 5 MG PO TABS: 5.0000 mg | ORAL_TABLET | Freq: Two times a day (BID) | ORAL | 0 refills | Status: DC

## 2019-06-26 MED ORDER — POLYETHYLENE GLYCOL 3350 17 G PO PACK: 17.0000 g | PACK | Freq: Every day | ORAL | 0 refills | Status: AC

## 2019-06-26 NOTE — Progress Notes (Addendum)
Pt d/c to [facility]. Pt is stable with no new concerns. Pt is on room air. D/C instructions and report called in to  [facility] at facility. Pt will be transported out of the hospital by [facility].   Pt had a good Bowel Movement.

## 2019-06-26 NOTE — Discharge Summary (Signed)
Discharge Summary  NALDA SHACKLEFORD ZOX:[id number] DOB: 23-Sep-1927  PCP: Kirby Funk, MD  Admit date: 06/18/2019 Discharge date: 06/26/2019  Time spent:    Recommendations for Outpatient Follow-up:  1. Follow-up with neurology 2. Follow-up with cardiology 3. Follow-up with your PCP 4. Take your medications as prescribed 5. Continue physical therapy 6. Fall precautions.  Discharge Diagnoses:  Active Hospital Problems   Diagnosis Date Noted  . Acute encephalopathy 06/18/2019  . CVA (cerebral vascular accident) (HCC) 06/19/2019  . Chronic kidney disease (CKD), stage II (mild) 09/10/2018  . Chronic diastolic CHF (congestive heart failure) (HCC) 02/06/2015  . HTN (hypertension) 03/18/2012  . Atrial fibrillation (HCC) 03/18/2012  . Hyperlipidemia with target LDL less than 70 03/18/2012    Resolved Hospital Problems  No resolved problems to display.    Discharge Condition: Stable  Diet recommendation: Heart healthy diet.  Vitals:   06/26/19 0323 06/26/19 0732  BP: (!) 154/81 (!) 183/72  Pulse: 64 70  Resp: 18 14  Temp: 98.2 F (36.8 C) 98.7 F (37.1 C)  SpO2: 97% 98%    History of present illness:  Hilda Rynders Boylesis a 85 y.o.femalewithhistory of atrial fibrillation, diastolic dysfunction, chronic kidney disease stage II, chronically bedbound was brought to the ER after patient was found to be increasingly confused and lethargic over the last 24 hours. Patient's daughter provided the history. As per the patient's daughter the night before last patient started become increasingly confused which continued since yesterday morning. Patient became more lethargic and moved her bowels in a diaper which usually does not do. Given the ongoing confusion patient was brought to the ER.  ED Course:In the ER patient was initially only oriented to name and gradually started following commands. CT head shows age indeterminant cerebellar infarct. ER physician discussed with  on-call neurologist Dr. Amada Jupiter who advised to get MRI brain but to keep patient addressing and also make sure there is no infectious process. At the time of my exam patient is alert awake oriented to name moving all extremities. Labs show WBC 11.2 creatinine 1.2 sodium 134 COVID-19 negative. UA unremarkable. EKG shows A. fib. With LBBB rate control. Chest x-ray does not show anything acute.   Acute right parietal occipital CVA.  Seen by neurology and cardiology.  Started on Eliquis on 06/22/2019.  Patient has a history of permanent A. fib, previously on Xarelto, wheelchair bound due to knee issues, unclear why oral anticoagulation was stopped.  PT assessed and recommended SNF.  Insurance denied initially then approved on 06/25/19 after family appealed.    06/26/19: Patient was seen and examined at her bedside this morning.  No acute events overnight.  She has no new complaints.  Vital signs and labs reviewed and are stable.  Patient is medically stable for discharge to SNF.  Hospital Course:  Principal Problem:   Acute encephalopathy Active Problems:   HTN (hypertension)   Hyperlipidemia with target LDL less than 70   Atrial fibrillation (HCC)   Chronic diastolic CHF (congestive heart failure) (HCC)   Chronic kidney disease (CKD), stage II (mild)   CVA (cerebral vascular accident) (HCC)  She has presented with altered mental status likely due to embolic right parieto-occipital infarct from atrial fibrillation.  She has not been on anticoagulation due to prior history of fall on Xarelto.   Acute ischemic CVA, right parieto-occipital Seen on MRI brain History of permanent A. fib previously on Xarelto, unclear why it was stopped.  Also wheelchair-bound due to knee issues. Per medical  records, last seen normal on 06/16/19  Admitted for stroke work-up LDL 66 goal less than 70 A1c 5.9 goal less than 7.0 2D echo normal LVEF 55 to 60% with no PFO. Currently on Lipitor to 80 mg  daily (At home on lipitor 20 mg daily) Completed ASA 325 mg; now on eliquis, continue Continue diltiazem, Lopressor and Imdur  Added po hydralazine 10 mg BID on 06/25/19 PT OT recommends SNF Speech recommends 24-hour supervision/assistance for cognitive communication deficit Continue Eliquis for permanent A. fib for secondary CVA prevention  Permanent A. fib Followed by Dr. Elease Hashimoto Was previously on Xarelto, unclear why it was stopped Chads Vascor 7 Restarted on Eliquis on 06/22/2019 Currently rate controlled Continue Eliquis, p.o. Cardizem and p.o. Lopressor as recommended by cardiology Follow-up with Dr. Elease Hashimoto in 3 months  Improving with hydralazine, uncontrolled hypertension Continue diltiazem p.o. 180 mg daily, Imdur 30 mg daily, Lopressor p.o. 50 mg twice daily  Added po hydralazine 10 mg BID Follow-up with your PCP  Chronic diastolic CHF Continue to hold off torsemide for now Continue strict I's and O's and daily weight Net I&O -4.9 L Follow-up with cardiology outpatient  Resolved leukocytosis Presented with mild leukocytosis WBC 11.2 WBC 9K on 06/25/2019. Blood cultures taken on 06/19/2019 peripherally x2 negative final.  Ambulatory dysfunction/wheelchair-bound PT OT recommendations for SNF. Covid screening ordered on 06/25/19: Nonreactive. Continue fall precautions  Chronic constipation Continue MiraLAX as needed   Code Status:DNR     Consults called:Neurology    Discharge Exam: BP (!) 183/72 (BP Location: Left Arm)   Pulse 70   Temp 98.7 F (37.1 C) (Oral)   Resp 14   Wt 79.5 kg   SpO2 98%   BMI 27.45 kg/m  . General: 85 y.o. year-old female well developed well nourished in no acute distress.  Alert and interactive.  Very hard of hearing. . Cardiovascular: Irregular rate and rhythm with no rubs or gallops.  No thyromegaly or JVD noted.   Marland Kitchen Respiratory: Clear to auscultation with no wheezes or rales. Good inspiratory effort. . Abdomen:  Soft nontender nondistended with normal bowel sounds x4 quadrants. . Musculoskeletal: No lower extremity edema. 2/4 pulses in all 4 extremities. Marland Kitchen Psychiatry: Mood is appropriate for condition and setting  Discharge Instructions You were cared for by a hospitalist during your hospital stay. If you have any questions about your discharge medications or the care you received while you were in the hospital after you are discharged, you can call the unit and asked to speak with the hospitalist on call if the hospitalist that took care of you is not available. Once you are discharged, your primary care physician will handle any further medical issues. Please note that NO REFILLS for any discharge medications will be authorized once you are discharged, as it is imperative that you return to your primary care physician (or establish a relationship with a primary care physician if you do not have one) for your aftercare needs so that they can reassess your need for medications and monitor your lab values.  Discharge Instructions    Ambulatory referral to Neurology   Complete by: As directed    Follow up in stroke clinic at [facility] with Ihor Austin, NP in about 4 weeks. If not available, consider Dr. Delia Heady, Dr. Jamelle Rushing, or Dr. Naomie Dean.     Allergies as of 06/26/2019      Reactions   Codeine Nausea And Vomiting   Penicillins Hives   Has  patient had a PCN reaction causing immediate rash, facial/tongue/throat swelling, SOB or lightheadedness with hypotension: No Has patient had a PCN reaction causing severe rash involving mucus membranes or skin necrosis: No Has patient had a PCN reaction that required hospitalization No Has patient had a PCN reaction occurring within the last 10 years: No If all of the above answers are "NO", then may proceed with Cephalosporin use.   Sulfur Nausea And Vomiting      Medication List    STOP taking these medications    LORazepam 0.5 MG tablet Commonly known as: ATIVAN   meloxicam 15 MG tablet Commonly known as: MOBIC   potassium chloride SA 20 MEQ tablet Commonly known as: KLOR-CON   torsemide 20 MG tablet Commonly known as: DEMADEX   zolpidem 10 MG tablet Commonly known as: AMBIEN     TAKE these medications    stroke: mapping our early stages of recovery book Misc 1 each by Does not apply route once for 1 dose.   apixaban 5 MG Tabs tablet Commonly known as: ELIQUIS Take 1 tablet (5 mg total) by mouth 2 (two) times daily.   atorvastatin 40 MG tablet Commonly known as: LIPITOR Take 1 tablet (40 mg total) by mouth daily. What changed:   medication strength  how much to take   Cartia XT 180 MG 24 hr capsule Generic drug: diltiazem Take 180 mg by mouth daily.   DULoxetine 60 MG capsule Commonly known as: CYMBALTA Take 60 mg by mouth daily.   hydrALAZINE 10 MG tablet Commonly known as: APRESOLINE Take 1 tablet (10 mg total) by mouth 2 (two) times daily.   isosorbide mononitrate 30 MG 24 hr tablet Commonly known as: IMDUR Take 1 tablet (30 mg total) by mouth daily.   metoprolol tartrate 25 MG tablet Commonly known as: LOPRESSOR Take 50 mg by mouth 2 (two) times daily.   polyethylene glycol 17 g packet Commonly known as: MIRALAX / GLYCOLAX Take 17 g by mouth daily.      Allergies  Allergen Reactions  . Codeine Nausea And Vomiting  . Penicillins Hives    Has patient had a PCN reaction causing immediate rash, facial/tongue/throat swelling, SOB or lightheadedness with hypotension: No Has patient had a PCN reaction causing severe rash involving mucus membranes or skin necrosis: No Has patient had a PCN reaction that required hospitalization No Has patient had a PCN reaction occurring within the last 10 years: No If all of the above answers are "NO", then may proceed with Cephalosporin use.   . Sulfur Nausea And Vomiting   Follow-up Information    [facility] Follow up in 4 week(s).   Specialty: Neurology Why: stroke clinic. office will call with appt date and time.  Contact information: [address] [place] [address] [phone]       Kirby Funk, MD. Call in 1 day(s).   Specialty: Internal Medicine Why: Please call for a post hospital follow-up appointment. Contact information: [address] [place] [address] [phone]        Nahser, Deloris Ping, MD .   Specialty: Cardiology Contact information: [address] [place] [address] [phone]            The results of significant diagnostics from this hospitalization (including imaging, microbiology, ancillary and laboratory) are listed below for reference.    Significant Diagnostic Studies: Ct Angio Head W Or Wo Contrast  Result Date: 06/20/2019 CLINICAL  DATA:  85 year old female with weakness and altered mental status. Right PCA/occipital pole infarct on brain MRI earlier today. EXAM: CT ANGIOGRAPHY HEAD AND NECK TECHNIQUE: Multidetector CT imaging of the head and neck was performed using the standard protocol during bolus administration of intravenous contrast. Multiplanar CT image reconstructions and MIPs were obtained to evaluate the vascular anatomy. Carotid stenosis measurements (when applicable) are obtained utilizing NASCET criteria, using the distal internal carotid diameter as the denominator. CONTRAST:  OMNIPAQUE IOHEXOL 350 MG/ML SOLN COMPARISON:  Brain MRI earlier today. Head CT 06/18/2019. FINDINGS: CT HEAD Brain: Stable small area of cytotoxic edema in the right superior occipital pole from the CT on 06/18/2019. No associated hemorrhage or mass effect. Elsewhere stable non contrast CT appearance of the brain. Confluent bilateral cerebral white matter hypodensity. Calvarium and skull base: No acute osseous abnormality identified. Paranasal sinuses: Visualized paranasal sinuses and  mastoids are stable and well pneumatized. Orbits: No acute orbit or scalp soft tissue findings. CTA NECK Skeleton: Mild for age degenerative changes in the spine. Chronic appearing mild upper thoracic superior endplate compression. No acute osseous abnormality identified. Upper chest: Negative. Other neck: Partially retropharyngeal course of the right carotid, normal variant. No acute findings. Aortic arch: 3 vessel arch configuration. Mild for age Calcified aortic atherosclerosis. Right carotid system: Negative brachiocephalic artery and right CCA origin. Calcified plaque at the right carotid bifurcation mostly affecting the ECA origin. Tortuous cervical right ICA without stenosis. Left carotid system: Negative left CCA. Moderate calcified plaque at the posterior left ICA origin and bulb but stenosis is less than 50 % with respect to the distal vessel. Vertebral arteries: Normal proximal right subclavian artery and right vertebral artery origin. Tortuous right V 2 segment. Patent right vertebral artery to the skull base without stenosis. Mild plaque in the proximal left subclavian artery without stenosis. Normal left vertebral artery origin. Codominant vertebral arteries. Tortuous left V2 segment. Patent left vertebral to the skull base without stenosis. CTA HEAD Posterior circulation: No distal vertebral plaque or stenosis. Patent PICA origins and vertebrobasilar junction. Patent basilar artery without stenosis. Normal SCA and left PCA origins. Fetal type right PCA origin. Left posterior communicating artery diminutive or absent. Mild left P1 and P2 segment irregularity without stenosis. Left PCA branches are within normal limits. Right posterior communicating artery and proximal right PCA are within normal limits. Patent right PCA bifurcation. Superior left PCA branches appear normal. There is a short segment high-grade stenosis of the inferior right P3 division on series 16, image 17 but preserved distal  enhancement. Anterior circulation: Both ICA siphons are patent. On the left there is mild calcified plaque without stenosis. On the right there is mild to moderate calcified plaque without stenosis. Normal right posterior communicating artery origin. Patent carotid termini. Normal MCA and ACA origins. Ectatic anterior communicating artery but otherwise bilateral ACA branches are within normal limits. Left MCA M1 segment and bifurcation are patent without stenosis. Left MCA branches are within normal limits. Right MCA M1 segment and bifurcation are patent without stenosis. Right MCA branches are within normal limits. Venous sinuses: Early contrast timing, but the superior sagittal sinus, dominant right transverse and sigmoid sinuses are patent along with the right IJ bulb. Anatomic variants: Fetal type right PCA origin. Review of the MIP images confirms the above findings IMPRESSION: 1. Negative for large vessel occlusion or stenosis. 2. Positive for a short segment high-grade stenosis of the inferior Right PCA P3 division, but appears unrelated to the recent superior right occipital  pole infarct. 3. Mild for age carotid atherosclerosis. Normal vertebral arteries and basilar. 4. Stable CT appearance of the small right occipital lobe infarct. No associated hemorrhage or mass effect. 5. No new intracranial abnormality. Electronically Signed   By: Odessa Fleming M.D.   On: 06/20/2019 00:05   Ct Head Wo Contrast  Result Date: 06/18/2019 CLINICAL DATA:  85 year old female with altered mental status. EXAM: CT HEAD WITHOUT CONTRAST TECHNIQUE: Contiguous axial images were obtained from the base of the skull through the vertex without intravenous contrast. COMPARISON:  Head CT dated 01/27/2015 FINDINGS: Brain: There is moderate age-related atrophy and advanced chronic microvascular ischemic changes. An area of hypodensity in the medial right occipital lobe (series 5, image 59 and axial series 2 image 51) is age indeterminate.  An acute or subacute infarct is not entirely excluded. Clinical correlation is recommended. MRI may provide better evaluation if there is clinical concern for acute infarct. There is no acute intracranial hemorrhage. No mass effect or midline shift. No extra-axial fluid collection. Vascular: No hyperdense vessel or unexpected calcification. Skull: Normal. Negative for fracture or focal lesion. Sinuses/Orbits: No acute finding. Other: None. IMPRESSION: 1. No acute intracranial hemorrhage. 2. Age indeterminate right occipital infarct. An acute or subacute infarct is not excluded. Clinical correlation is recommended. MRI may provide better evaluation if there is clinical concern for an acute infarct. 3. Age-related atrophy and advanced chronic microvascular ischemic changes. These results were called by telephone at the time of interpretation on 06/18/2019 at 6:46 pm to provider [facility] , who verbally acknowledged these results. Electronically Signed   By: Elgie Collard M.D.   On: 06/18/2019 18:50   Ct Angio Neck W Or Wo Contrast  Result Date: 06/20/2019 CLINICAL DATA:  85 year old female with weakness and altered mental status. Right PCA/occipital pole infarct on brain MRI earlier today. EXAM: CT ANGIOGRAPHY HEAD AND NECK TECHNIQUE: Multidetector CT imaging of the head and neck was performed using the standard protocol during bolus administration of intravenous contrast. Multiplanar CT image reconstructions and MIPs were obtained to evaluate the vascular anatomy. Carotid stenosis measurements (when applicable) are obtained utilizing NASCET criteria, using the distal internal carotid diameter as the denominator. CONTRAST:  OMNIPAQUE IOHEXOL 350 MG/ML SOLN COMPARISON:  Brain MRI earlier today. Head CT 06/18/2019. FINDINGS: CT HEAD Brain: Stable small area of cytotoxic edema in the right superior occipital pole from the CT on 06/18/2019. No associated hemorrhage or mass effect. Elsewhere stable non  contrast CT appearance of the brain. Confluent bilateral cerebral white matter hypodensity. Calvarium and skull base: No acute osseous abnormality identified. Paranasal sinuses: Visualized paranasal sinuses and mastoids are stable and well pneumatized. Orbits: No acute orbit or scalp soft tissue findings. CTA NECK Skeleton: Mild for age degenerative changes in the spine. Chronic appearing mild upper thoracic superior endplate compression. No acute osseous abnormality identified. Upper chest: Negative. Other neck: Partially retropharyngeal course of the right carotid, normal variant. No acute findings. Aortic arch: 3 vessel arch configuration. Mild for age Calcified aortic atherosclerosis. Right carotid system: Negative brachiocephalic artery and right CCA origin. Calcified plaque at the right carotid bifurcation mostly affecting the ECA origin. Tortuous cervical right ICA without stenosis. Left carotid system: Negative left CCA. Moderate calcified plaque at the posterior left ICA origin and bulb but stenosis is less than 50 % with respect to the distal vessel. Vertebral arteries: Normal proximal right subclavian artery and right vertebral artery origin. Tortuous right V 2 segment. Patent right vertebral artery to the  skull base without stenosis. Mild plaque in the proximal left subclavian artery without stenosis. Normal left vertebral artery origin. Codominant vertebral arteries. Tortuous left V2 segment. Patent left vertebral to the skull base without stenosis. CTA HEAD Posterior circulation: No distal vertebral plaque or stenosis. Patent PICA origins and vertebrobasilar junction. Patent basilar artery without stenosis. Normal SCA and left PCA origins. Fetal type right PCA origin. Left posterior communicating artery diminutive or absent. Mild left P1 and P2 segment irregularity without stenosis. Left PCA branches are within normal limits. Right posterior communicating artery and proximal right PCA are within normal  limits. Patent right PCA bifurcation. Superior left PCA branches appear normal. There is a short segment high-grade stenosis of the inferior right P3 division on series 16, image 17 but preserved distal enhancement. Anterior circulation: Both ICA siphons are patent. On the left there is mild calcified plaque without stenosis. On the right there is mild to moderate calcified plaque without stenosis. Normal right posterior communicating artery origin. Patent carotid termini. Normal MCA and ACA origins. Ectatic anterior communicating artery but otherwise bilateral ACA branches are within normal limits. Left MCA M1 segment and bifurcation are patent without stenosis. Left MCA branches are within normal limits. Right MCA M1 segment and bifurcation are patent without stenosis. Right MCA branches are within normal limits. Venous sinuses: Early contrast timing, but the superior sagittal sinus, dominant right transverse and sigmoid sinuses are patent along with the right IJ bulb. Anatomic variants: Fetal type right PCA origin. Review of the MIP images confirms the above findings IMPRESSION: 1. Negative for large vessel occlusion or stenosis. 2. Positive for a short segment high-grade stenosis of the inferior Right PCA P3 division, but appears unrelated to the recent superior right occipital pole infarct. 3. Mild for age carotid atherosclerosis. Normal vertebral arteries and basilar. 4. Stable CT appearance of the small right occipital lobe infarct. No associated hemorrhage or mass effect. 5. No new intracranial abnormality. Electronically Signed   By: Odessa FlemingH  Yaritzy Huser M.D.   On: 06/20/2019 00:05   Mr Brain Wo Contrast  Result Date: 06/19/2019 CLINICAL DATA:  Weakness and altered mental status.  Lethargy. EXAM: MRI HEAD WITHOUT CONTRAST TECHNIQUE: Multiplanar, multiecho pulse sequences of the brain and surrounding structures were obtained without intravenous contrast. COMPARISON:  Head CT 06/18/2019 FINDINGS: Brain: Diffusion  imaging shows a 2-3 cm region of acute infarction in the right occipital parietal junction region. Mild swelling but no mass effect or hemorrhage. Elsewhere, there chronic small-vessel ischemic changes of the pons. No focal cerebellar insult. Cerebral hemispheres show atrophy with chronic small-vessel ischemic changes throughout the white matter. No mass, hydrocephalus or extra-axial collection. Vascular: Major vessels at the base of the brain show flow. Skull and upper cervical spine: Negative Sinuses/Orbits: Clear/normal Other: None IMPRESSION: 2-3 cm acute infarction at the right parietooccipital junction. Mild swelling but no mass effect, shift or hemorrhage. Background pattern of acute extensive atrophy and chronic small-vessel ischemic changes elsewhere throughout the brain Electronically Signed   By: Paulina FusiMark  Shogry M.D.   On: 06/19/2019 15:12   Dg Chest Port 1 View  Result Date: 06/18/2019 CLINICAL DATA:  85 year old female with weakness and altered mental status. EXAM: PORTABLE CHEST 1 VIEW COMPARISON:  Chest radiograph dated 09/09/2018 FINDINGS: Evaluation is limited as the left costophrenic angle has been excluded from the image. The visualized lungs are clear. No pleural effusion or pneumothorax. Stable cardiac silhouette. Atherosclerotic calcification of the aortic arch. No acute osseous pathology. Right humeral intramedullary rod. IMPRESSION: No active  disease. Electronically Signed   By: Elgie Collard M.D.   On: 06/18/2019 23:56    Microbiology: Recent Results (from the past 240 hour(s))  SARS CORONAVIRUS 2 (TAT 6-24 HRS) Nasopharyngeal Nasopharyngeal Swab     Status: None   Collection Time: 06/18/19  5:45 PM   Specimen: Nasopharyngeal Swab  Result Value Ref Range Status   SARS Coronavirus 2 NEGATIVE NEGATIVE Final    Comment: (NOTE) SARS-CoV-2 target nucleic acids are NOT DETECTED. The SARS-CoV-2 RNA is generally detectable in upper and lower respiratory specimens during the acute  phase of infection. Negative results do not preclude SARS-CoV-2 infection, do not rule out co-infections with other pathogens, and should not be used as the sole basis for treatment or other patient management decisions. Negative results must be combined with clinical observations, patient history, and epidemiological information. The expected result is Negative. Fact Sheet for Patients: HairSlick.no Fact Sheet for Healthcare Providers: quierodirigir.com This test is not yet approved or cleared by the [place] FDA and  has been authorized for detection and/or diagnosis of SARS-CoV-2 by FDA under an Emergency Use Authorization (EUA). This EUA will remain  in effect (meaning this test can be used) for the duration of the COVID-19 declaration under Section 56 4(b)(1) of the Act, 21 U.S.C. section 360bbb-3(b)(1), unless the authorization is terminated or revoked sooner. Performed at [facility], [address], [place], [place] [address]   Culture, blood (routine x 2)     Status: None   Collection Time: 06/19/19  1:25 AM   Specimen: BLOOD  Result Value Ref Range Status   Specimen Description   Final    BLOOD BLOOD LEFT WRIST Performed at [facility], [address] [place], [place] [address]    Special Requests   Final    BOTTLES DRAWN AEROBIC AND ANAEROBIC Blood Culture results may not be optimal due to an inadequate volume of blood received in culture bottles Performed at [facility], [address], [place], [place] [address]    Culture   Final    NO GROWTH 5 DAYS Performed at [facility], [address], [place], [place] [address]    Report Status 06/24/2019 FINAL  Final  Culture, blood (routine x 2)     Status: None   Collection Time: 06/19/19  1:25 AM   Specimen: BLOOD  Result Value Ref Range Status   Specimen Description   Final    BLOOD BLOOD LEFT  HAND Performed at [facility], [address] [place], [place] [address]    Special Requests   Final    BOTTLES DRAWN AEROBIC AND ANAEROBIC Blood Culture results may not be optimal due to an inadequate volume of blood received in culture bottles Performed at [facility], [address], [place], [place] [address]    Culture   Final    NO GROWTH 5 DAYS Performed at [facility], [address] [place], [place] [address]    Report Status 06/24/2019 FINAL  Final  SARS CORONAVIRUS 2 (TAT 6-24 HRS) Nasopharyngeal Nasopharyngeal Swab     Status: None   Collection Time: 06/25/19  9:08 AM   Specimen: Nasopharyngeal Swab  Result Value Ref Range Status   SARS Coronavirus 2 NEGATIVE NEGATIVE Final    Comment: (NOTE) SARS-CoV-2 target nucleic acids are NOT DETECTED. The SARS-CoV-2 RNA is generally detectable in upper and lower respiratory specimens during the acute phase of infection. Negative  results do not preclude SARS-CoV-2 infection, do not rule out co-infections with other pathogens, and should not be used as the sole basis for treatment or other patient management decisions. Negative results must be combined with clinical observations, patient history, and epidemiological information. The expected result is Negative. Fact Sheet for Patients: SugarRoll.be Fact Sheet for Healthcare Providers: https://www.woods-mathews.com/ This test is not yet approved or cleared by the [place] FDA and  has been authorized for detection and/or diagnosis of SARS-CoV-2 by FDA under an Emergency Use Authorization (EUA). This EUA will remain  in effect (meaning this test can be used) for the duration of the COVID-19 declaration under Section 56 4(b)(1) of the Act, 21 U.S.C. section 360bbb-3(b)(1), unless the authorization is terminated or revoked sooner. Performed at [facility], [place] [address],  [place], [place] [address]      Labs: Basic Metabolic Panel: Recent Labs  Lab 06/25/19 0348 06/26/19 0206  NA 137 136  K 3.2* 5.1  CL 102 100  CO2 26 29  GLUCOSE 102* 105*  BUN 7* 11  CREATININE 0.92 1.09*  CALCIUM 9.5 10.2  MG  --  2.4   Liver Function Tests: No results for input(s): AST, ALT, ALKPHOS, BILITOT, PROT, ALBUMIN in the last 168 hours. No results for input(s): LIPASE, AMYLASE in the last 168 hours. No results for input(s): AMMONIA in the last 168 hours. CBC: Recent Labs  Lab 06/25/19 0348  WBC 9.3  HGB 13.8  HCT 39.3  MCV 87.7  PLT 193   Cardiac Enzymes: No results for input(s): CKTOTAL, CKMB, CKMBINDEX, TROPONINI in the last 168 hours. BNP: BNP (last 3 results) Recent Labs    09/09/18 1216  BNP 309.6*    ProBNP (last 3 results) No results for input(s): PROBNP in the last 8760 hours.  CBG: No results for input(s): GLUCAP in the last 168 hours.     Signed:  Kayleen Memos, MD [facility] 06/26/2019, 9:45 AM

## 2019-06-26 NOTE — TOC Transition Note (Signed)
Transition of Care [facility]) - CM/SW Discharge Note   Patient Details  Name: Carrie Best MRN: [id number] Date of Birth: 04-10-1928  Transition of Care [facility]) CM/SW Contact:  Pollie Friar, RN Phone Number: 06/26/2019, 10:23 AM   Clinical Narrative:    Pt discharging to [facility] today. [place] would like her to arrive around lunch time. D/c packet at the desk. Bedside RN updated.  Pt to transport via [facility]. Daughter called and updated.   Room: 1203P Number for report: [phone]   Final next level of care: Skilled Nursing Facility Barriers to Discharge: No Barriers Identified   Patient Goals and CMS Choice   CMS Medicare.gov Compare Post Acute Care list provided to:: Patient Represenative (must comment) Choice offered to / list presented to : Adult Children  Discharge Placement              Patient chooses bed at: [facility] Patient to be transferred to facility by: [facility] Name of family member notified: Susie--daughter Patient and family notified of of transfer: 06/26/19  Discharge Plan and Services In-house Referral: Clinical Social Work Discharge Planning Services: [employer] Consult Post Acute Care Choice: [place]                               Social Determinants of Health (SDOH) Interventions     Readmission Risk Interventions No flowsheet data found.

## 2019-08-10 ENCOUNTER — Other Ambulatory Visit: Payer: Self-pay | Admitting: Cardiovascular Disease

## 2019-08-15 ENCOUNTER — Inpatient Hospital Stay: Payer: Medicare Other | Admitting: Adult Health

## 2019-08-21 ENCOUNTER — Ambulatory Visit: Payer: Medicare Other | Admitting: Adult Health

## 2019-08-21 ENCOUNTER — Other Ambulatory Visit: Payer: Self-pay

## 2019-08-21 ENCOUNTER — Encounter: Payer: Self-pay | Admitting: Adult Health

## 2019-08-21 VITALS — BP 118/56 | HR 57 | Temp 96.8°F | Ht 67.0 in

## 2019-08-21 DIAGNOSIS — I10 Essential (primary) hypertension: Secondary | ICD-10-CM | POA: Diagnosis not present

## 2019-08-21 DIAGNOSIS — I4821 Permanent atrial fibrillation: Secondary | ICD-10-CM

## 2019-08-21 DIAGNOSIS — I639 Cerebral infarction, unspecified: Secondary | ICD-10-CM

## 2019-08-21 DIAGNOSIS — I5032 Chronic diastolic (congestive) heart failure: Secondary | ICD-10-CM

## 2019-08-21 DIAGNOSIS — E785 Hyperlipidemia, unspecified: Secondary | ICD-10-CM

## 2019-08-21 DIAGNOSIS — R0609 Other forms of dyspnea: Secondary | ICD-10-CM

## 2019-08-21 DIAGNOSIS — R0600 Dyspnea, unspecified: Secondary | ICD-10-CM

## 2019-08-21 NOTE — Progress Notes (Signed)
[facility] [address] [place]. [place] [address] [phone]       HOSPITAL FOLLOW UP NOTE  [name] Date of Birth:  1928/02/20 Medical Record Number:  [id number]   Reason for Referral:  hospital stroke follow up    CHIEF COMPLAINT:  Chief Complaint  Patient presents with  . Hospitalization Follow-up    Daughter in law present. Rm 9. Patient complains of being fatigued.     HPI: Carrie Best being seen today for in office hospital follow-up regarding right occipital infarct embolic secondary to known AF not on AC due to fall risk on 06/21/2019.  History obtained from patient, daughter in law and chart review. Reviewed all radiology images and labs personally.  Carrie Best is a 85 y.o. female with history of hearing loss, previous stroke, hypertension, hyperlipidemia, heart murmur, dysrhythmia-atrial fibrillation rate controlledand not on anticoagulation due to fall risk, diastolic dysfunction, depression, congestive heart failure  presented on 06/21/2019 less interactive and talkative with HA, lethargy and fecal incontinence.   Stroke work-up showed a right occipital infarct as evidenced on MRI secondary to known AF not on AC due to fall risk.  CTA head/neck negative E LVO with short high-grade stenosis inferior right PCA P3 and mild for age ICA arthrosclerosis.  2D echo normal EF without cardiac source of embolus identified.  Recommended initiation of Eliquis due to recent stroke and history of A. fib.  HTN stable.  LDL 66 and recommended continuation of atorvastatin 20 mg daily.  No evidence or history of DM with A1c 5.9.  Other stroke risk factors include advanced age, obesity, history of stroke/TIA, family history of stroke and history of CHF with systolic dysfunction.  Carrie Best is a 85 year old female who is being seen today for hospital follow-up accompanied by her daughter-in-law.  She has recovered well from a stroke standpoint but  does continue to complain of decreased vision and fatigue.  Unable to determine if visual concerns consist of diplopia or visual loss but she plans on following up with ophthalmology in the near future.  She also complains of increased fatigue along with recent onset of dyspnea with exertion.  She has not had a follow-up with cardiology since discharge.  She is primarily wheelchair-bound due to right dislocated kneecap.  Per daughter, they have seen multiple providers and unfortunately no interventions recommended.  Currently living with her daughter-in-law to assist with daily needs.  She has continued on Eliquis without bleeding or bruising.  She continues on atorvastatin without myalgias.  Blood pressure today 118/56.  No further concerns at this time.    ROS:   14 system review of systems performed and negative with exception of dyspnea on exertion, decreased vision and pain  PMH:  Past Medical History:  Diagnosis Date  . Anxiety   . Arthritis   . Congestive heart failure (HCC)    Congestive heart failure symptoms  . Depression   . Diastolic dysfunction   . Dyspnea    occasionally, with exertion  . Dysrhythmia 05-10-12   hx. A. Fib., rate is controlled-tx. Xarelto  . GERD (gastroesophageal reflux disease) 05-10-12   tx. with meds as needed  . Hearing loss 05-10-12   bilateral hearing aids.  . Heart murmur   . History of kidney stones   . Hyperlipidemia   . Hypertension   . Pneumonia 05-10-12   dx. in July- tx. antibiotics  . Stroke (HCC) 05-10-12   Lt.CVA note on scans-hx.  freq. falls/ none past 6 months 01/21/14  . Wears hearing aid    bilateral    PSH:  Past Surgical History:  Procedure Laterality Date  . APPENDECTOMY    . CATARACT EXTRACTION, BILATERAL  05-10-12   bilateral  . CYSTOSCOPY WITH RETROGRADE PYELOGRAM, URETEROSCOPY AND STENT PLACEMENT Right 01/31/2014   Procedure: CYSTOSCOPY WITH bilateral  RETROGRADE PYELOGRAM, right URETEROSCOPY AND right  STENT PLACEMENT, bladder  biopsy with fulgeration;  Surgeon: Sebastian Ache, MD;  Location: [facility];  Service: Urology;  Laterality: Right;  . HARDWARE REMOVAL Right 09/09/2014   Procedure: HARDWARE REMOVAL RIGHT SHOULDER;  Surgeon: Mable Paris, MD;  Location: [facility];  Service: Orthopedics;  Laterality: Right;  Right shoulder hardware removal  . HOLMIUM LASER APPLICATION Right 01/31/2014   Procedure: HOLMIUM LASER APPLICATION;  Surgeon: Sebastian Ache, MD;  Location: [facility];  Service: Urology;  Laterality: Right;  . HUMERUS IM NAIL Right 06/10/2014   Procedure: INTRAMEDULLARY (IM) NAIL HUMERAL;  Surgeon: Mable Paris, MD;  Location: [facility];  Service: Orthopedics;  Laterality: Right;  Right intramedullary humeral nail  . INTERSTIM IMPLANT PLACEMENT  05-10-12   now malfunctioning  . INTERSTIM IMPLANT REMOVAL  05/16/2012   Procedure: REMOVAL OF INTERSTIM IMPLANT;  Surgeon: Martina Sinner, MD;  Location: [facility];  Service: Urology;  Laterality: N/A;  . JOINT REPLACEMENT    . OTHER SURGICAL HISTORY     Bladder Surgery  . SHOULDER SURGERY    . TOTAL KNEE ARTHROPLASTY Right 02/17/2015   Procedure: TOTAL KNEE ARTHROPLASTY;  Surgeon: Gean Birchwood, MD;  Location: [facility];  Service: Orthopedics;  Laterality: Right;    Social History:  Social History   Socioeconomic History  . Marital status: Widowed    Spouse name: Not on file  . Number of children: Not on file  . Years of education: Not on file  . Highest education level: Not on file  Occupational History  . Not on file  Tobacco Use  . Smoking status: Never Smoker  . Smokeless tobacco: Never Used  Substance and Sexual Activity  . Alcohol use: No  . Drug use: No  . Sexual activity: Never  Other Topics Concern  . Not on file  Social History Narrative  . Not on file   Social Determinants of Health   Financial Resource Strain:   . Difficulty of Paying Living Expenses: Not on file  Food Insecurity:   . Worried About Patent examiner in the Last Year: Not on file  . Ran Out of Food in the Last Year: Not on file  Transportation Needs:   . Lack of Transportation (Medical): Not on file  . Lack of Transportation (Non-Medical): Not on file  Physical Activity:   . Days of Exercise per Week: Not on file  . Minutes of Exercise per Session: Not on file  Stress:   . Feeling of Stress : Not on file  Social Connections:   . Frequency of Communication with Friends and Family: Not on file  . Frequency of Social Gatherings with Friends and Family: Not on file  . Attends Religious Services: Not on file  . Active Member of Clubs or Organizations: Not on file  . Attends Banker Meetings: Not on file  . Marital Status: Not on file  Intimate Partner Violence:   . Fear of Current or Ex-Partner: Not on file  . Emotionally Abused: Not on file  . Physically Abused: Not on file  .  Sexually Abused: Not on file    Family History:  Family History  Problem Relation Age of Onset  . Stroke Mother   . Stroke Brother     Medications:   Current Outpatient Medications on File Prior to Visit  Medication Sig Dispense Refill  . apixaban (ELIQUIS) 5 MG TABS tablet Take 1 tablet (5 mg total) by mouth 2 (two) times daily. 60 tablet 0  . atorvastatin (LIPITOR) 40 MG tablet Take 1 tablet (40 mg total) by mouth daily. 30 tablet 0  . diltiazem (CARTIA XT) 180 MG 24 hr capsule Take 180 mg by mouth daily.    . DULoxetine (CYMBALTA) 60 MG capsule Take 60 mg by mouth daily.     . isosorbide mononitrate (IMDUR) 30 MG 24 hr tablet TAKE 1 TABLET(30 MG) BY MOUTH DAILY 30 tablet 1  . LORazepam (ATIVAN) 0.5 MG tablet Take 0.5 mg by mouth 3 (three) times daily as needed.    . metoprolol tartrate (LOPRESSOR) 25 MG tablet Take 50 mg by mouth 2 (two) times daily.     . polyethylene glycol (MIRALAX / GLYCOLAX) 17 g packet Take 17 g by mouth daily. 14 each 0  . hydrALAZINE (APRESOLINE) 10 MG tablet Take 1 tablet (10 mg total) by mouth 2 (two)  times daily. 60 tablet 0   No current facility-administered medications on file prior to visit.    Allergies:   Allergies  Allergen Reactions  . Codeine Nausea And Vomiting  . Penicillins Hives    Has patient had a PCN reaction causing immediate rash, facial/tongue/throat swelling, SOB or lightheadedness with hypotension: No Has patient had a PCN reaction causing severe rash involving mucus membranes or skin necrosis: No Has patient had a PCN reaction that required hospitalization No Has patient had a PCN reaction occurring within the last 10 years: No If all of the above answers are "NO", then may proceed with Cephalosporin use.   . Sulfur Nausea And Vomiting     Physical Exam  Vitals:   08/21/19 1505  BP: (!) 118/56  Pulse: (!) 57  Temp: (!) 96.8 F (36 C)  TempSrc: Oral  Height: 5\' 7"  (1.702 m)   Body mass index is 27.45 kg/m. No exam data present  [facility]  Decreased Interest 0  Down, Depressed, Hopeless 0  PHQ - 2 Score 0     General: well developed, well nourished,  pleasant elderly Caucasian female, seated, in no evident distress Head: head normocephalic and atraumatic.   Neck: supple with no carotid or supraclavicular bruits Cardiovascular: irregular rate and rhythm, no murmurs Musculoskeletal: no deformity; right dislocated kneecap Skin:  no rash/petichiae Vascular:  Normal pulses all extremities   Neurologic Exam Mental Status: Awake and fully alert.   Normal speech and language.  Oriented to place and time. Recent and remote memory intact. Attention span, concentration and fund of knowledge appropriate. Mood and affect appropriate.  Cranial Nerves: Fundoscopic exam reveals sharp disc margins. Pupils equal, briskly reactive to light. Extraocular movements full without nystagmus. Visual fields full to confrontation.  Reports subjective generalized decreased vision.  Hearing intact. Facial sensation intact. Face, tongue, palate  moves normally and symmetrically.  Motor: Normal bulk and tone. Normal strength in all tested extremity muscles. Sensory.: intact to touch , pinprick , position and vibratory sensation.  Coordination: Rapid alternating movements normal in all extremities. Finger-to-nose and heel-to-shin performed accurately bilaterally. Gait and Station: Deferred as patient nonambulatory due to ortho condition as above Reflexes:  1+ and symmetric. Toes downgoing.     NIHSS  0 Modified Rankin  1 CHA2DS2-VASc 8 HAS-BLED 2   Diagnostic Data (Labs, Imaging, Testing)  CT HEAD WO CONTRAST 06/18/2019 IMPRESSION: 1. No acute intracranial hemorrhage. 2. Age indeterminate right occipital infarct. An acute or subacute infarct is not excluded. Clinical correlation is recommended. MRI may provide better evaluation if there is clinical concern for an acute infarct. 3. Age-related atrophy and advanced chronic microvascular ischemic changes.  CT ANGIO HEAD W OR WO CONTRAST CT ANGIO NECK W OR WO CONTRAST 06/19/2019 IMPRESSION: 1. Negative for large vessel occlusion or stenosis. 2. Positive for a short segment high-grade stenosis of the inferior Right PCA P3 division, but appears unrelated to the recent superior right occipital pole infarct. 3. Mild for age carotid atherosclerosis. Normal vertebral arteries and basilar. 4. Stable CT appearance of the small right occipital lobe infarct. No associated hemorrhage or mass effect. 5. No new intracranial abnormality.  MR BRAIN WO CONTRAST 06/19/2019 IMPRESSION: 2-3 cm acute infarction at the right parietooccipital junction. Mild swelling but no mass effect, shift or hemorrhage. Background pattern of acute extensive atrophy and chronic small-vessel ischemic changes elsewhere throughout the brain  ECHOCARDIOGRAM 06/20/2019 IMPRESSIONS    1. Left ventricular ejection fraction, by visual estimation, is 55 to 60%. The left ventricle has mildly decreased  function. Normal left ventricular size. There is no left ventricular hypertrophy.  2. Left ventricular diastolic function could not be evaluated pattern of LV diastolic filling.  3. Global right ventricle has normal systolic function.The right ventricular size is normal. No increase in right ventricular wall thickness.  4. Left atrial size was moderately dilated.  5. Right atrial size was moderately dilated.  6. Presence of pericardial fat pad.  7. Small pericardial effusion.  8. The mitral valve is normal in structure. Mild mitral valve regurgitation. No evidence of mitral stenosis.  9. The tricuspid valve is normal in structure. Tricuspid valve regurgitation is mild. 10. The aortic valve is normal in structure. Aortic valve regurgitation was not visualized by color flow Doppler. Structurally normal aortic valve, with no evidence of sclerosis or stenosis. 11. The pulmonic valve was normal in structure. Pulmonic valve regurgitation is not visualized by color flow Doppler. 12. Moderately elevated pulmonary artery systolic pressure. 13. The inferior vena cava is dilated in size with <50% respiratory variability, suggesting right atrial pressure of 15 mmHg.    ASSESSMENT: Carrie Best is a 85 y.o. year old female presented with lethargy with headache and fecal incontinence on 06/18/2019 with stroke work-up showing right occipital infarct embolic secondary to known AF not on AC due to fall risk. Vascular risk factors include history of stroke, HTN, HLD, atrial fibrillation not on AC due to fall risk, CHF and diastolic dysfunction.  She has recovered well from a stroke standpoint with only subjective decreased generalized vision as well as increased fatigue.  She also has been complaining of dyspnea on exertion    PLAN:  1. Right occipital stroke: Continue Eliquis (apixaban) daily  and atorvastatin 20 mg daily for secondary stroke prevention. Maintain strict control of hypertension with blood  pressure goal below 130/90, diabetes with hemoglobin A1c goal below 6.5% and cholesterol with LDL cholesterol (bad cholesterol) goal below 70 mg/dL.  I also advised the patient to eat a healthy diet with plenty of whole grains, cereals, fruits and vegetables, exercise regularly with at least 30 minutes of continuous activity daily and maintain ideal body weight. 2. HTN: Advised to continue  current treatment regimen.  Today's BP stable.  Advised to continue to monitor at home along with continued follow-up with PCP for management 3. HLD: Advised to continue current treatment regimen along with continued follow-up with PCP for future prescribing and monitoring of lipid panel 4. Atrial fibrillation/CHF: Continuation of Eliquis and follow-up with cardiology.  Advised daughter and patient that follow-up appointment should be scheduled with cardiology for ongoing monitoring and management of Eliquis and atrial fibrillation.  Also advised to discuss further with cardiology regarding complaints of dyspnea on exertion as this likely cardiac issue and not related to recent stroke 5. Visual impairment, post stroke: She plans on following up with ophthalmology in the near future for further evaluation    Follow up in 4 months or call earlier if needed   Greater than 50% of time during this 45 minute visit was spent on counseling, explanation of diagnosis of occipital stroke, reviewing risk factor management of atrial fibrillation, HTN, HLD and CHF, planning of further management along with potential future management, and discussion with patient and family answering all questions.    Frann Rider, AGNP-BC  [facility] [address] [place], [place] [address]  Phone [phone] Fax [phone] Note: This document was prepared with digital dictation and possible smart phrase technology. Any transcriptional errors that result from this process are unintentional.

## 2019-08-21 NOTE — Patient Instructions (Signed)
Continue Eliquis (apixaban) daily  and Lipitor for secondary stroke prevention  Continue to follow up with PCP regarding cholesterol and blood pressure management   Schedule follow-up visit with cardiology along with discussion regarding new onset shortness of breath with activity  Follow-up with ophthalmology to further assess visual symptoms  Continue to monitor blood pressure at home  Maintain strict control of hypertension with blood pressure goal below 130/90, diabetes with hemoglobin A1c goal below 6.5% and cholesterol with LDL cholesterol (bad cholesterol) goal below 70 mg/dL. I also advised the patient to eat a healthy diet with plenty of whole grains, cereals, fruits and vegetables, exercise regularly and maintain ideal body weight.  Followup in the future with me in 4 months or call earlier if needed       Thank you for coming to see [place] at [facility]. I hope we have been able to provide you high quality care today.  You may receive a patient satisfaction survey over the next few weeks. We would appreciate your feedback and comments so that we may continue to improve ourselves and the health of our patients.

## 2019-08-21 NOTE — Progress Notes (Signed)
I agree with the above plan 

## 2019-08-23 ENCOUNTER — Encounter: Payer: Self-pay | Admitting: Cardiovascular Disease

## 2019-08-23 NOTE — Progress Notes (Signed)
Cardiology Office Note   Date:  08/24/2019   ID:  Carrie Best, DOB [birth year], MRN [id number]  PCP:  Kirby FunkGriffin, John, MD  Cardiologist:   Kristeen MissPhilip Jimmy Plessinger, MD   Chief Complaint  Patient presents with  . Congestive Heart Failure  . Hypertension   1. Atrial fibrillation 2. Hypertension 3. Dementia 4. Diastolic dysfunction 5. Hyperlipidemia   [name] is an 85 yo who I have seen in the past. She was admitted with respiratory failure last year. She was found to She has developed some dementia. She has had frequent episodes of dyspnea. Typically with exertion. She does not eat any extra salt.   She still feels poorly - has good days and bad days   She is not able to walk any distance primarily due to leg weakness and leg pain.   Jan 16, 2014:  [name] is doing oK. Able to do her normal activies with minimal dyspnea ( she does not do much).     February 06, 2015:  Carrie Best is a 85 y.o. female who presents for her  Atrial fib and chronic diastolic congestive heart failure. She was recently hospitalized for mental status changes ( hypersomulence)  No specific etiology was found   She needs to have knee surgery.   Will need to hold the Xarelto  February 06, 2016: Had her knee surgery last year but it was not successful.   Now she is wheelchair buond. Wants to have shoulder surgery now   Oct. 23, 2018:    Had shoulder surgery .   Had some complications apparently . Had right knee surgery that did not go well.    June 25, 2018:  Has not felt well since last week .  Has had some dyspnea,   Is in a wheelchair all the time  + orthopnea  Does not eat much salty foods  Has had some leg swelling  Takes furosemide 40 BID   Echo last year showed normal LV systolic function  Feb. 18, 2020:  Carrie Best is seen back for follow-up of her atrial fibrillation, hypertension and dementia. She was hospitalized with CHF exacerbation in Jan.     torsemide has been  decreased to 20 mg a day .   August 24, 2019:  Carrie Best is seen today for follow-up visit.   [place]  She has a history of atrial fibrillation, hypertension and dementia.  She also has congestive heart failure.  Echo to perform June 20, 2019 revealed normal left ventricular systolic function.  She has presumed diastolic dysfunction although this cannot be evaluated because of her atrial fibrillation. Does not walk.   Due to previous knee surgery .    Examined in her wheel chair  Is getting progressively more and more short of breat Eats liver pudding weekly .   Past Medical History:  Diagnosis Date  . Anxiety   . Arthritis   . Congestive heart failure (HCC)    Congestive heart failure symptoms  . Depression   . Diastolic dysfunction   . Dyspnea    occasionally, with exertion  . Dysrhythmia 05-10-12   hx. A. Fib., rate is controlled-tx. Xarelto  . GERD (gastroesophageal reflux disease) 05-10-12   tx. with meds as needed  . Hearing loss 05-10-12   bilateral hearing aids.  . Heart murmur   . History of kidney stones   . Hyperlipidemia   . Hypertension   . Pneumonia 05-10-12   dx.  in July- tx. antibiotics  . Stroke ([place]) 05-10-12   Lt.CVA note on scans-hx. freq. falls/ none past 6 months 01/21/14  . Wears hearing aid    bilateral    Past Surgical History:  Procedure Laterality Date  . APPENDECTOMY    . CATARACT EXTRACTION, BILATERAL  05-10-12   bilateral  . CYSTOSCOPY WITH RETROGRADE PYELOGRAM, URETEROSCOPY AND STENT PLACEMENT Right 01/31/2014   Procedure: CYSTOSCOPY WITH bilateral  RETROGRADE PYELOGRAM, right URETEROSCOPY AND right  STENT PLACEMENT, bladder biopsy with fulgeration;  Surgeon: Alexis Frock, MD;  Location: [facility];  Service: Urology;  Laterality: Right;  . HARDWARE REMOVAL Right 09/09/2014   Procedure: HARDWARE REMOVAL RIGHT SHOULDER;  Surgeon: Nita Sells, MD;  Location: [place];  Service: Orthopedics;   Laterality: Right;  Right shoulder hardware removal  . HOLMIUM LASER APPLICATION Right 1/61/0960   Procedure: HOLMIUM LASER APPLICATION;  Surgeon: Alexis Frock, MD;  [facility];  Service: Urology;  Laterality: Right;  . HUMERUS IM NAIL Right 06/10/2014   Procedure: INTRAMEDULLARY (IM) NAIL HUMERAL;  Surgeon: Nita Sells, MD;  Location: [place];  Service: Orthopedics;  Laterality: Right;  Right intramedullary humeral nail  . INTERSTIM IMPLANT PLACEMENT  05-10-12   now malfunctioning  . INTERSTIM IMPLANT REMOVAL  05/16/2012   Procedure: REMOVAL OF INTERSTIM IMPLANT;  Surgeon: Reece Packer, MD;  [facility];  Service: Urology;  Laterality: N/A;  . JOINT REPLACEMENT    . OTHER SURGICAL HISTORY     Bladder Surgery  . SHOULDER SURGERY    . TOTAL KNEE ARTHROPLASTY Right 02/17/2015   Procedure: TOTAL KNEE ARTHROPLASTY;  Surgeon: Frederik Pear, MD;  Location: [place];  Service: Orthopedics;  Laterality: Right;     Current Outpatient Medications  Medication Sig Dispense Refill  . apixaban (ELIQUIS) 5 MG TABS tablet Take 1 tablet (5 mg total) by mouth 2 (two) times daily. 60 tablet 0  . atorvastatin (LIPITOR) 40 MG tablet Take 1 tablet (40 mg total) by mouth daily. 30 tablet 0  . diltiazem (CARTIA XT) 180 MG 24 hr capsule Take 180 mg by mouth daily.    . DULoxetine (CYMBALTA) 60 MG capsule Take 60 mg by mouth daily.     . hydrALAZINE (APRESOLINE) 10 MG tablet Take 1 tablet (10 mg total) by mouth 2 (two) times daily. 60 tablet 0  . isosorbide mononitrate (IMDUR) 30 MG 24 hr tablet TAKE 1 TABLET(30 MG) BY MOUTH DAILY 30 tablet 1  . LORazepam (ATIVAN) 0.5 MG tablet Take 0.5 mg by mouth 3 (three) times daily as needed.    . metoprolol tartrate (LOPRESSOR) 25 MG tablet Take 50 mg by mouth 2 (two) times daily.     . polyethylene glycol (MIRALAX / GLYCOLAX) 17 g packet Take 17 g by mouth daily. 14 each 0  . torsemide (DEMADEX) 20 MG tablet Take 20 mg by mouth daily.    Marland Kitchen zolpidem  (AMBIEN) 10 MG tablet Take 10 mg by mouth at bedtime.     No current facility-administered medications for this visit.    Allergies:   Codeine, Penicillins, and Sulfur    Social History:  The patient  reports that she has never smoked. She has never used smokeless tobacco. She reports that she does not drink alcohol or use drugs.   Family History:  The patient's family history includes Stroke in her brother and mother.    ROS:  Please see the history of present illness.  All other systems are reviewed and negative.   Physical Exam: Blood pressure (!) 114/52, pulse 72, height 5\' 7"  (1.702 m), SpO2 94 %.  GEN:  Elderly female,  NAD  HEENT: Normal NECK: No JVD; No carotid bruits LYMPHATICS: No lymphadenopathy CARDIAC: RRR , no murmurs, rubs, gallops RESPIRATORY:  Clear to auscultation without rales, wheezing or rhonchi  ABDOMEN: Soft, non-tender, non-distended MUSCULOSKELETAL:  No edema; No deformity  SKIN: Warm and dry NEUROLOGIC:  Alert and oriented x 3   EKG:      Recent Labs: 09/09/2018: B Natriuretic Peptide 309.6 09/10/2018: TSH 1.852 06/18/2019: ALT 16 06/25/2019: Hemoglobin 13.8; Platelets 193 06/26/2019: BUN 11; Creatinine, Ser 1.09; Magnesium 2.4; Potassium 5.1; Sodium 136    Lipid Panel    Component Value Date/Time   CHOL 126 06/20/2019 0559   TRIG 75 06/20/2019 0559   HDL 45 06/20/2019 0559   CHOLHDL 2.8 06/20/2019 0559   VLDL 15 06/20/2019 0559   LDLCALC 66 06/20/2019 0559      Wt Readings from Last 3 Encounters:  06/26/19 175 lb 4.3 oz (79.5 kg)  10/24/18 184 lb 12.8 oz (83.8 kg)  09/11/18 182 lb 5.1 oz (82.7 kg)      Other studies Reviewed: Additional studies/ records that were reviewed today include: . Review of the above records demonstrates:   ASSESSMENT AND PLAN:  1. Atrial fibrillation -   HR is well controlled.   Cont current meds     2. Hypertension -     BP I ok.  Needs to watch her salty foods.    3. Dementia -   Pleasant,   Needs some assistance .  Lives with daughter in law   4. Chronic Diastolic dysfunction: . 11/10/18  5.  Dyspnea:   She is very deconditioned.  She does not walk now and gets very short of breath when she does anything .   I explained that this is likely all due to generalized deconditioning. .   Previous echos have shown normal LV systolic function.    5. Hyperlipidemia :    6. CVA _ has Afib,  Cont. eliquis   Current medicines are reviewed at length with the patient today.  The patient does not have concerns regarding medicines.  The following changes have been made:  no change  Labs/ tests ordered today include:  No orders of the defined types were placed in this encounter.    Disposition:   FU with Marland Kitchen in 6 months     [place], MD  08/24/2019 12:18 PM    [facility] [address] [place], [place], [place]  [place] Phone: [phone]; Fax: [phone]

## 2019-08-24 ENCOUNTER — Encounter

## 2019-08-24 ENCOUNTER — Encounter: Payer: Self-pay | Admitting: Cardiovascular Disease

## 2019-08-24 ENCOUNTER — Other Ambulatory Visit: Payer: Self-pay

## 2019-08-24 ENCOUNTER — Ambulatory Visit: Payer: Medicare Other | Admitting: Cardiovascular Disease

## 2019-08-24 VITALS — BP 114/52 | HR 72 | Ht 67.0 in

## 2019-08-24 DIAGNOSIS — I4821 Permanent atrial fibrillation: Secondary | ICD-10-CM

## 2019-08-24 DIAGNOSIS — I63431 Cerebral infarction due to embolism of right posterior cerebral artery: Secondary | ICD-10-CM

## 2019-08-24 DIAGNOSIS — I5033 Acute on chronic diastolic (congestive) heart failure: Secondary | ICD-10-CM | POA: Diagnosis not present

## 2019-08-24 NOTE — Patient Instructions (Signed)
Medication Instructions:  Your physician recommends that you continue on your current medications as directed. Please refer to the Current Medication list given to you today.  *If you need a refill on your cardiac medications before your next appointment, please call your pharmacy*  Lab Work: None Ordered   Testing/Procedures: None Ordered   Follow-Up: At [facility], you and your health needs are our priority.  As part of our continuing mission to provide you with exceptional heart care, we have created designated Provider Care Teams.  These Care Teams include your primary Cardiologist (physician) and Advanced Practice Providers (APPs -  Physician Assistants and Nurse Practitioners) who all work together to provide you with the care you need, when you need it.  Your next appointment:   6 month(s)  The format for your next appointment:   Either In Person or Virtual  Provider:   Scott Weaver, PA-C, Vin Bhagat, PA-C or Janine Hammond, NP    

## 2019-09-13 ENCOUNTER — Ambulatory Visit: Payer: Medicare Other | Admitting: Cardiovascular Disease

## 2019-10-13 ENCOUNTER — Other Ambulatory Visit: Payer: Self-pay | Admitting: Cardiovascular Disease

## 2019-10-15 ENCOUNTER — Other Ambulatory Visit: Payer: Self-pay | Admitting: Cardiovascular Disease

## 2019-10-15 MED ORDER — ISOSORBIDE MONONITRATE ER 30 MG PO TB24: ORAL_TABLET | ORAL | 10 refills | Status: DC

## 2019-12-13 ENCOUNTER — Other Ambulatory Visit: Payer: Self-pay

## 2019-12-13 MED ORDER — TORSEMIDE 20 MG PO TABS: 20.0000 mg | ORAL_TABLET | Freq: Every day | ORAL | 2 refills | Status: DC

## 2019-12-13 NOTE — Addendum Note (Signed)
Addended by: Demetrios Loll on: 12/13/2019 12:03 PM   Modules accepted: Orders

## 2019-12-20 ENCOUNTER — Ambulatory Visit: Payer: Medicare Other | Admitting: Adult Health

## 2019-12-20 ENCOUNTER — Other Ambulatory Visit: Payer: Self-pay

## 2019-12-20 ENCOUNTER — Encounter: Payer: Self-pay | Admitting: Adult Health

## 2019-12-20 VITALS — BP 128/65 | HR 71 | Temp 98.1°F

## 2019-12-20 DIAGNOSIS — I639 Cerebral infarction, unspecified: Secondary | ICD-10-CM | POA: Diagnosis not present

## 2019-12-20 DIAGNOSIS — I4821 Permanent atrial fibrillation: Secondary | ICD-10-CM | POA: Diagnosis not present

## 2019-12-20 DIAGNOSIS — E785 Hyperlipidemia, unspecified: Secondary | ICD-10-CM

## 2019-12-20 DIAGNOSIS — I10 Essential (primary) hypertension: Secondary | ICD-10-CM | POA: Diagnosis not present

## 2019-12-20 NOTE — Progress Notes (Signed)
[facility] [address] [place]. [place] [address] [phone]       STROKE FOLLOW UP NOTE  [name] Date of Birth:  01-Aug-1928 Medical Record Number:  [id number]   Reason for Referral: stroke follow up    CHIEF COMPLAINT:  Chief Complaint  Patient presents with   Occipital Stroke    rm 9, 4 month FU, dgtr-in-law- Carrie Best "one fall in March, saw NP afterwards; very SOB"    HPI:  Today, 12/20/2019, Carrie Best is being seen for stroke follow-up accompanied by her daughter.  She has been doing well from a stroke standpoint without residual deficits or reoccurring symptoms.  Continues on Eliquis and atorvastatin for secondary stroke prevention.  Blood pressure today 128/65.  Cognitive impairment has been stable.  She does report a fall in March where she hit her head on the fireplace but fortunately without injury -was evaluated in her PCP office.  Follow-up with cardiology for atrial fibrillation follow-up and complaints of dyspnea which was felt likely due to deconditioning as she is wheelchair-bound.  When she becomes severely dyspneic, daughter will give her lorazepam and torsemide with benefit.  No further concerns at this time.    History provided for reference purposes only Stroke admission 06/21/2019: Carrie Best is a 86 y.o. female with history of hearing loss, previous stroke, hypertension, hyperlipidemia, heart murmur, dysrhythmia-atrial fibrillation rate controlledand not on anticoagulation due to fall risk, diastolic dysfunction, depression, congestive heart failure  presented on 06/21/2019 less interactive and talkative with HA, lethargy and fecal incontinence.   Stroke work-up showed a right occipital infarct as evidenced on MRI secondary to known AF not on AC due to fall risk.  CTA head/neck negative E LVO with short high-grade stenosis inferior right PCA P3 and mild for age ICA arthrosclerosis.  2D echo normal EF without cardiac  source of embolus identified.  Recommended initiation of Eliquis due to recent stroke and history of A. fib.  HTN stable.  LDL 66 and recommended continuation of atorvastatin 20 mg daily.  No evidence or history of DM with A1c 5.9.  Other stroke risk factors include advanced age, obesity, history of stroke/TIA, family history of stroke and history of CHF with systolic dysfunction.  Initial visit 08/21/2019 JM: Carrie Best is a 86 year old female who is being seen today for hospital follow-up accompanied by her daughter-in-law.  She has recovered well from a stroke standpoint but does continue to complain of decreased vision and fatigue.  Unable to determine if visual concerns consist of diplopia or visual loss but she plans on following up with ophthalmology in the near future.  She also complains of increased fatigue along with recent onset of dyspnea with exertion.  She has not had a follow-up with cardiology since discharge.  She is primarily wheelchair-bound due to right dislocated kneecap.  Per daughter, they have seen multiple providers and unfortunately no interventions recommended.  Currently living with her daughter-in-law to assist with daily needs.  She has continued on Eliquis without bleeding or bruising.  She continues on atorvastatin without myalgias.  Blood pressure today 118/56.  No further concerns at this time.    ROS:   14 system review of systems performed and negative with exception of dyspnea, gait impairment and cognitive impairment   PMH:  Past Medical History:  Diagnosis Date   Anxiety    Arthritis    Congestive heart failure (HCC)    Congestive heart failure symptoms   Depression  Diastolic dysfunction    Dyspnea    occasionally, with exertion   Dysrhythmia 05-10-12   hx. A. Fib., rate is controlled-tx. Xarelto   GERD (gastroesophageal reflux disease) 05-10-12   tx. with meds as needed   Hearing loss 05-10-12   bilateral hearing aids.   Heart murmur     History of kidney stones    Hyperlipidemia    Hypertension    Pneumonia 05-10-12   dx. in July- tx. antibiotics   Stroke ([place]) 05-10-12   Lt.CVA note on scans-hx. freq. falls/ none past 6 months 01/21/14   Wears hearing aid    bilateral    PSH:  Past Surgical History:  Procedure Laterality Date   APPENDECTOMY     CATARACT EXTRACTION, BILATERAL  05-10-12   bilateral   CYSTOSCOPY WITH RETROGRADE PYELOGRAM, URETEROSCOPY AND STENT PLACEMENT Right 01/31/2014   Procedure: CYSTOSCOPY WITH bilateral  RETROGRADE PYELOGRAM, right URETEROSCOPY AND right  STENT PLACEMENT, bladder biopsy with fulgeration;  Surgeon: Alexis Frock, MD;  [facility];  Service: Urology;  Laterality: Right;   HARDWARE REMOVAL Right 09/09/2014   Procedure: HARDWARE REMOVAL RIGHT SHOULDER;  Surgeon: Nita Sells, MD;  Location: [place];  Service: Orthopedics;  Laterality: Right;  Right shoulder hardware removal   HOLMIUM LASER APPLICATION Right 12/13/8117   Procedure: HOLMIUM LASER APPLICATION;  Surgeon: Alexis Frock, MD;  [facility];  Service: Urology;  Laterality: Right;   HUMERUS IM NAIL Right 06/10/2014   Procedure: INTRAMEDULLARY (IM) NAIL HUMERAL;  Surgeon: Nita Sells, MD;  Location: [place];  Service: Orthopedics;  Laterality: Right;  Right intramedullary humeral nail   INTERSTIM IMPLANT PLACEMENT  05-10-12   now malfunctioning   INTERSTIM IMPLANT REMOVAL  05/16/2012   Procedure: REMOVAL OF INTERSTIM IMPLANT;  Surgeon: Reece Packer, MD;  [facility];  Service: Urology;  Laterality: N/A;   JOINT REPLACEMENT     OTHER SURGICAL HISTORY     Bladder Surgery   SHOULDER SURGERY     TOTAL KNEE ARTHROPLASTY Right 02/17/2015   Procedure: TOTAL KNEE ARTHROPLASTY;  Surgeon: Frederik Pear, MD;  Location: [place];  Service: Orthopedics;  Laterality: Right;    Social History:  Social History   Socioeconomic History   Marital status: Widowed    Spouse name:  Not on file   Number of children: Not on file   Years of education: Not on file   Highest education level: Not on file  Occupational History   Not on file  Tobacco Use   Smoking status: Never Smoker   Smokeless tobacco: Never Used  Substance and Sexual Activity   Alcohol use: No   Drug use: No   Sexual activity: Never  Other Topics Concern   Not on file  Social History Narrative   Not on file   Social Determinants of Health   Financial Resource Strain:    Difficulty of Paying Living Expenses:   Food Insecurity:    Worried About [place] in the Last Year:    Arboriculturist in the Last Year:   Transportation Needs:    Film/video editor (Medical):    Lack of Transportation (Non-Medical):   Physical Activity:    Days of Exercise per Week:    Minutes of Exercise per Session:   Stress:    Feeling of Stress :   Social Connections:    Frequency of Communication with Friends and Family:    Frequency of Social Gatherings  with Friends and Family:    Attends Religious Services:    Active Member of Clubs or Organizations:    Attends Engineer, structural:    Marital Status:   Intimate Partner Violence:    Fear of Current or Ex-Partner:    Emotionally Abused:    Physically Abused:    Sexually Abused:     Family History:  Family History  Problem Relation Age of Onset   Stroke Mother    Stroke Brother     Medications:   Current Outpatient Medications on File Prior to Visit  Medication Sig Dispense Refill   apixaban (ELIQUIS) 5 MG TABS tablet Take 1 tablet (5 mg total) by mouth 2 (two) times daily. 60 tablet 0   atorvastatin (LIPITOR) 40 MG tablet Take 1 tablet (40 mg total) by mouth daily. 30 tablet 0   diltiazem (CARTIA XT) 180 MG 24 hr capsule Take 180 mg by mouth daily.     DULoxetine (CYMBALTA) 60 MG capsule Take 60 mg by mouth daily.      isosorbide mononitrate (IMDUR) 30 MG 24 hr tablet TAKE 1 TABLET(30  MG) BY MOUTH DAILY 30 tablet 10   LORazepam (ATIVAN) 0.5 MG tablet Take 0.5 mg by mouth 3 (three) times daily as needed.     metoprolol tartrate (LOPRESSOR) 25 MG tablet Take 50 mg by mouth 2 (two) times daily.      polyethylene glycol (MIRALAX / GLYCOLAX) 17 g packet Take 17 g by mouth daily. 14 each 0   Potassium Chloride ER 20 MEQ TBCR Take 1 tablet by mouth daily.     torsemide (DEMADEX) 20 MG tablet Take 1 tablet (20 mg total) by mouth daily. 90 tablet 2   zolpidem (AMBIEN) 10 MG tablet Take 10 mg by mouth at bedtime.     hydrALAZINE (APRESOLINE) 10 MG tablet Take 1 tablet (10 mg total) by mouth 2 (two) times daily. 60 tablet 0   No current facility-administered medications on file prior to visit.    Allergies:   Allergies  Allergen Reactions   Codeine Nausea And Vomiting   Penicillins Hives    Has patient had a PCN reaction causing immediate rash, facial/tongue/throat swelling, SOB or lightheadedness with hypotension: No Has patient had a PCN reaction causing severe rash involving mucus membranes or skin necrosis: No Has patient had a PCN reaction that required hospitalization No Has patient had a PCN reaction occurring within the last 10 years: No If all of the above answers are "NO", then may proceed with Cephalosporin use.    Sulfur Nausea And Vomiting     Physical Exam  Vitals:   12/20/19 1324  BP: 128/65  Pulse: 71  Temp: 98.1 F (36.7 C)   There is no height or weight on file to calculate BMI. No exam data present   General: well developed, well nourished,  pleasant elderly Caucasian female, seated, in no evident distress Head: head normocephalic and atraumatic.   Neck: supple with no carotid or supraclavicular bruits Cardiovascular: irregular rate and rhythm, no murmurs Musculoskeletal: no deformity Skin:  no rash/petichiae Vascular:  Normal pulses all extremities   Neurologic Exam Mental Status: Awake and fully alert.  Normal speech and  language. Oriented to place and time. Recent memory impaired and remote memory intact. Attention span, concentration and fund of knowledge mostly appropriate with daughter-in-law providing some history. Mood and affect appropriate.  Cranial Nerves: Pupils equal, briskly reactive to light. Extraocular movements full without nystagmus. Visual fields full  to confrontation.  Hearing intact. Facial sensation intact. Face, tongue, palate moves normally and symmetrically.  Motor: Normal bulk and tone. Normal strength in all tested extremity muscles. Sensory.: intact to touch , pinprick , position and vibratory sensation.  Coordination: Rapid alternating movements normal in all extremities. Finger-to-nose and heel-to-shin performed accurately bilaterally. Gait and Station: Deferred as patient nonambulatory due to ortho condition as above Reflexes: 1+ and symmetric. Toes downgoing.       ASSESSMENT: Carrie Best is a 86 y.o. year old female presented with lethargy with headache and fecal incontinence on 06/18/2019 with stroke work-up showing right occipital infarct embolic secondary to known AF not on AC due to fall risk. Vascular risk factors include history of stroke, HTN, HLD, atrial fibrillation, cognitive impairment, CHF and diastolic dysfunction.  Stable from stroke standpoint without residual deficits.    PLAN:  1. Right occipital stroke: Continue Eliquis (apixaban) daily  and atorvastatin 20 mg daily for secondary stroke prevention. Maintain strict control of hypertension with blood pressure goal below 130/90, diabetes with hemoglobin A1c goal below 6.5% and cholesterol with LDL cholesterol (bad cholesterol) goal below 70 mg/dL.  I also advised the patient to eat a healthy diet with plenty of whole grains, cereals, fruits and vegetables, exercise regularly with at least 30 minutes of continuous activity daily and maintain ideal body weight. 2. HTN: Advised to continue current treatment regimen.   Today's BP stable.  Advised to continue to monitor at home along with continued follow-up with PCP for management 3. HLD: Advised to continue current treatment regimen along with continued follow-up with PCP for future prescribing and monitoring of lipid panel 4. Atrial fibrillation/CHF: Continuation of Eliquis and ongoing follow-up with cardiology for monitoring and management   Overall stable from stroke standpoint and continues to follow closely with PCP and cardiology therefore recommend follow-up as needed at this time   I spent 26 minutes of face-to-face and non-face-to-face time with patient and daughter.  This included previsit chart review, lab review, study review, order entry, electronic health record documentation, patient education     Ihor Austin, [facility]  [facility] [address] [place], [place] [address]  Phone [phone] Fax [phone] Note: This document was prepared with digital dictation and possible smart phrase technology. Any transcriptional errors that result from this process are unintentional.

## 2019-12-20 NOTE — Patient Instructions (Signed)
Continue Eliquis (apixaban) daily  and atorvastatin for secondary stroke prevention  Continue to follow up with PCP regarding cholesterol and blood pressure management   Continue to follow with cardiology for atrial fibrillation and Eliquis management -recommended follow-up around June (6 months from prior visit)  Maintain strict control of hypertension with blood pressure goal below 130/90, diabetes with hemoglobin A1c goal below 6.5% and cholesterol with LDL cholesterol (bad cholesterol) goal below 70 mg/dL. I also advised the patient to eat a healthy diet with plenty of whole grains, cereals, fruits and vegetables, exercise regularly and maintain ideal body weight.         Thank you for coming to see [place] at [facility]. I hope we have been able to provide you high quality care today.  You may receive a patient satisfaction survey over the next few weeks. We would appreciate your feedback and comments so that we may continue to improve ourselves and the health of our patients.

## 2019-12-24 NOTE — Progress Notes (Signed)
I agree with the above plan 

## 2020-03-03 NOTE — Progress Notes (Signed)
Cardiology Office Note:    Date:  03/04/2020   ID:  Carrie Best, DOB 28-Apr-1928, MRN [id number]  PCP:  Kirby Funk, MD  Cardiologist:  Kristeen Miss, MD  Electrophysiologist:  None   Referring MD: Kirby Funk, MD   Chief Complaint:  Follow-up (AFib, CHF, dyspnea)    Patient Profile:    Carrie Best is a 86 y.o. female with:   Permanent Atrial fibrillation   Chronic Diastolic CHF   Dementia  Hypertension   Hyperlipidemia   Chronic kidney disease   Wheelchair bound after prior ortho surgeries   Hx of CVA in 06/2019 (Rivaroxaban changed to Apixaban)  Prior CV studies: Echocardiogram 06/20/2019 EF 55-60, normal RVSF, mod BAE, pericardial fat pad, small pericardial eff, mild MR, mild TR, RVSP 52.8    History of Present Illness:    Carrie Best was last seen by Dr. Elease Hashimoto in 08/2019.  She returns for follow up.   She is here today with her daughter.  She continues to struggle with shortness of breath with minimal activity.  She has not had orthopnea or paroxysmal nocturnal dyspnea.  She has chronic pedal edema.  She takes torsemide on a daily basis.  She has sometimes taken an extra dose of torsemide without significant improvement.  She has not had chest discomfort, arm discomfort or jaw discomfort.  She has not had syncope.  Past Medical History:  Diagnosis Date  . Anxiety   . Arthritis   . Congestive heart failure (HCC)    Congestive heart failure symptoms  . Depression   . Diastolic dysfunction   . Dyspnea    occasionally, with exertion  . Dysrhythmia 05-10-12   hx. A. Fib., rate is controlled-tx. Xarelto  . GERD (gastroesophageal reflux disease) 05-10-12   tx. with meds as needed  . Hearing loss 05-10-12   bilateral hearing aids.  . Heart murmur   . History of kidney stones   . Hyperlipidemia   . Hypertension   . Pneumonia 05-10-12   dx. in July- tx. antibiotics  . Stroke (HCC) 05-10-12   Lt.CVA note on scans-hx. freq. falls/ none past 6 months  01/21/14  . Wears hearing aid    bilateral    Current Medications: Current Meds  Medication Sig  . apixaban (ELIQUIS) 5 MG TABS tablet Take 1 tablet (5 mg total) by mouth 2 (two) times daily.  Marland Kitchen atorvastatin (LIPITOR) 40 MG tablet Take 1 tablet (40 mg total) by mouth daily.  Marland Kitchen diltiazem (CARTIA XT) 180 MG 24 hr capsule Take 180 mg by mouth daily.  . DULoxetine (CYMBALTA) 60 MG capsule Take 60 mg by mouth daily.   . hydrALAZINE (APRESOLINE) 10 MG tablet Take 1 tablet (10 mg total) by mouth 2 (two) times daily. (Patient taking differently: Take 10 mg by mouth 3 (three) times daily. )  . isosorbide mononitrate (IMDUR) 30 MG 24 hr tablet TAKE 1 TABLET(30 MG) BY MOUTH DAILY  . LORazepam (ATIVAN) 0.5 MG tablet Take 0.5 mg by mouth 3 (three) times daily as needed.  . metoprolol tartrate (LOPRESSOR) 50 MG tablet Take 50 mg by mouth 2 (two) times daily.  . polyethylene glycol (MIRALAX / GLYCOLAX) 17 g packet Take 17 g by mouth daily.  . Potassium Chloride ER 20 MEQ TBCR Take 1 tablet by mouth daily.  Marland Kitchen torsemide (DEMADEX) 20 MG tablet Take 1 tablet (20 mg total) by mouth daily.  Marland Kitchen zolpidem (AMBIEN) 10 MG tablet Take 10 mg by mouth at bedtime.  Allergies:   Codeine, Penicillins, and Sulfur   Social History   Tobacco Use  . Smoking status: Never Smoker  . Smokeless tobacco: Never Used  Vaping Use  . Vaping Use: Never used  Substance Use Topics  . Alcohol use: No  . Drug use: No     Family Hx: The patient's family history includes Stroke in her brother and mother.  Review of Systems  Constitutional: Negative for fever.  Respiratory: Positive for wheezing.   Gastrointestinal: Negative for hematochezia and melena.  Genitourinary: Negative for hematuria.     EKGs/Labs/Other Test Reviewed:    EKG:  EKG is   ordered today.  The ekg ordered today demonstrates atrial fibrillation, HR 60, leftward axis, anteroseptal Q waves, nonspecific ST-T wave changes, IVCD, QTC 452, similar to prior  tracings  Recent Labs: 06/18/2019: ALT 16 06/25/2019: Hemoglobin 13.8; Platelets 193 06/26/2019: BUN 11; Creatinine, Ser 1.09; Magnesium 2.4; Potassium 5.1; Sodium 136   Recent Lipid Panel Lab Results  Component Value Date/Time   CHOL 126 06/20/2019 05:59 AM   TRIG 75 06/20/2019 05:59 AM   HDL 45 06/20/2019 05:59 AM   CHOLHDL 2.8 06/20/2019 05:59 AM   LDLCALC 66 06/20/2019 05:59 AM    Physical Exam:    VS:  BP (!) 102/58   Ht 5\' 7"  (1.702 m)   BMI 27.45 kg/m     Wt Readings from Last 3 Encounters:  06/26/19 175 lb 4.3 oz (79.5 kg)  10/24/18 184 lb 12.8 oz (83.8 kg)  09/11/18 182 lb 5.1 oz (82.7 kg)     Constitutional:      Appearance: Healthy appearance. Not in distress.  Pulmonary:     Effort: Pulmonary effort is normal.     Breath sounds: No wheezing. No rales.  Cardiovascular:     Normal rate. Irregularly irregular rhythm. Normal S1. Normal S2.     Murmurs: There is no murmur.  Edema:    Feet: bilateral 1+ edema of the feet. Abdominal:     Palpations: Abdomen is soft.  Musculoskeletal:     Cervical back: Neck supple. Skin:    General: Skin is warm and dry.  Neurological:     Mental Status: Alert and oriented to person, place and time.      ASSESSMENT & PLAN:    1. Shortness of breath 2. Hypoxia As noted, her most significant symptom is shortness of breath with minimal activity.  Etiology is not entirely clear.  This may be related to deconditioning from her comorbid illnesses versus congestive heart failure versus underlying pulmonary disease.  She does not have chest pain.  At this point, I do not think stress testing is indicated.  Her daughter has a pulse oximeter at home and they have recorded oxygen saturation in the 80% range at times.  Today, her O2 sat was 90-91%.  She is basically wheelchair-bound.  If she gets up to go to the bathroom or come to the dinner table, she becomes significantly short of breath.  Her echocardiogram in October 2020 did  demonstrate evidence of pulmonary hypertension.  She has also noticed wheezing at times.  Question if her shortness of breath could be from underlying pulmonary disease.  She has never been a smoker.  It may be worthwhile to have an evaluation with pulmonology for further recommendations.  She may simply benefit from continuous O2.  The patient and her daughter would both like to pursue evaluation with pulmonology.  -Obtain BMET, CBC, BNP  -If her BNP  is significantly elevated, I will adjust her torsemide  -Refer to pulmonology  3. Permanent atrial fibrillation (HCC) Heart rate is controlled.  She is tolerating anticoagulation.  Obtain CBC, BMET today.  Continue current dose of Apixaban.  4. Chronic diastolic heart failure (HCC) NYHA III.  Volume status appears to be fairly stable.  Obtain BNP as outlined above.  If her BNP is significantly elevated, I will adjust her torsemide further.  5. Essential hypertension The patient's blood pressure is controlled on her current regimen.  Continue current therapy.     Dispo:  Return in about 6 months (around 09/03/2020) for Routine Follow Up, w/ Dr. Elease Hashimoto, or Tereso Newcomer, PA-C, in person.   Medication Adjustments/Labs and Tests Ordered: Current medicines are reviewed at length with the patient today.  Concerns regarding medicines are outlined above.  Tests Ordered: Orders Placed This Encounter  Procedures  . Basic metabolic panel  . Pro b natriuretic peptide (BNP)  . CBC  . Ambulatory referral to Pulmonology  . EKG 12-Lead   Medication Changes: No orders of the defined types were placed in this encounter.   Signed, Tereso Newcomer, PA-C  03/04/2020 5:19 PM    [facility] [address] [place], [place], [place]  [address] Phone: [phone]; Fax: [phone]

## 2020-03-04 ENCOUNTER — Ambulatory Visit: Payer: Medicare Other | Admitting: Physician Assistant

## 2020-03-04 ENCOUNTER — Other Ambulatory Visit: Payer: Self-pay

## 2020-03-04 ENCOUNTER — Encounter: Payer: Self-pay | Admitting: Physician Assistant

## 2020-03-04 VITALS — BP 102/58 | Ht 67.0 in

## 2020-03-04 DIAGNOSIS — I4821 Permanent atrial fibrillation: Secondary | ICD-10-CM | POA: Diagnosis not present

## 2020-03-04 DIAGNOSIS — I5032 Chronic diastolic (congestive) heart failure: Secondary | ICD-10-CM | POA: Diagnosis not present

## 2020-03-04 DIAGNOSIS — I10 Essential (primary) hypertension: Secondary | ICD-10-CM

## 2020-03-04 DIAGNOSIS — R0602 Shortness of breath: Secondary | ICD-10-CM

## 2020-03-04 DIAGNOSIS — R0902 Hypoxemia: Secondary | ICD-10-CM

## 2020-03-04 NOTE — Patient Instructions (Signed)
Medication Instructions:  Your physician recommends that you continue on your current medications as directed. Please refer to the Current Medication list given to you today.  *If you need a refill on your cardiac medications before your next appointment, please call your pharmacy*   Lab Work: BMET, BNP, CBC  If you have labs (blood work) drawn today and your tests are completely normal, you will receive your results only by: Marland Kitchen MyChart Message (if you have MyChart) OR . A paper copy in the mail If you have any lab test that is abnormal or we need to change your treatment, we will call you to review the results.   Testing/Procedures: None   Follow-Up: At [facility], you and your health needs are our priority.  As part of our continuing mission to provide you with exceptional heart care, we have created designated Provider Care Teams.  These Care Teams include your primary Cardiologist (physician) and Advanced Practice Providers (APPs -  Physician Assistants and Nurse Practitioners) who all work together to provide you with the care you need, when you need it.  We recommend signing up for the patient portal called "MyChart".  Sign up information is provided on this After Visit Summary.  MyChart is used to connect with patients for Virtual Visits (Telemedicine).  Patients are able to view lab/test results, encounter notes, upcoming appointments, etc.  Non-urgent messages can be sent to your provider as well.   To learn more about what you can do with MyChart, go to [email or web address].    Your next appointment:   6 month(s)  The format for your next appointment:   In Person  Provider:   Kristeen Miss, MD or Tereso Newcomer, PA   Other Instructions You have been referred to Pulmonology. They will contact you to set up an appointment.

## 2020-03-05 LAB — CBC
Hematocrit: 36.3 % (ref 34.0–46.6)
Hemoglobin: 11.7 g/dL (ref 11.1–15.9)
MCH: 29.9 pg (ref 26.6–33.0)
MCHC: 32.2 g/dL (ref 31.5–35.7)
MCV: 93 fL (ref 79–97)
Platelets: 200 10*3/uL (ref 150–450)
RBC: 3.91 x10E6/uL (ref 3.77–5.28)
RDW: 14.4 % (ref 11.7–15.4)
WBC: 8.3 10*3/uL (ref 3.4–10.8)

## 2020-03-05 LAB — PRO B NATRIURETIC PEPTIDE: NT-Pro BNP: 1661 pg/mL — ABNORMAL HIGH (ref 0–738)

## 2020-03-05 LAB — BASIC METABOLIC PANEL
BUN/Creatinine Ratio: 17 (ref 12–28)
BUN: 19 mg/dL (ref 10–36)
CO2: 28 mmol/L (ref 20–29)
Calcium: 10.2 mg/dL (ref 8.7–10.3)
Chloride: 99 mmol/L (ref 96–106)
Creatinine, Ser: 1.11 mg/dL — ABNORMAL HIGH (ref 0.57–1.00)
GFR calc Af Amer: 51 mL/min/{1.73_m2} — ABNORMAL LOW (ref >59)
GFR calc non Af Amer: 44 mL/min/{1.73_m2} — ABNORMAL LOW (ref >59)
Glucose: 95 mg/dL (ref 65–99)
Potassium: 4.1 mmol/L (ref 3.5–5.2)
Sodium: 136 mmol/L (ref 134–144)

## 2020-03-06 ENCOUNTER — Telehealth: Payer: Self-pay | Admitting: *Deleted

## 2020-03-06 NOTE — Telephone Encounter (Signed)
-----   Message from Beatrice Lecher, [place] sent at 03/05/2020  5:22 PM EDT ----- Creatinine stable.  K+, Hgb normal.  BNP somewhat elevated.   PLAN:   - Increase Torsemide to 20 mg twice daily x 3 days, then resume 20 mg once daily   - Increase K+ to 20 mEq twice daily x 3 days, then resume 20 mEq once daily  Tereso Newcomer, PA-C    03/05/2020 5:17 PM

## 2020-03-06 NOTE — Telephone Encounter (Signed)
DPR ok to s/w pt's daughter-in-law Carrie Best who has been notified of lab results and recommendations for medication adjustment. Carrie Best is agreeable to plan of care for the pt to increase Torsemide 20 mg BID x 3 days and increase K+ 20 meq BID x 3 days; after the 3 days the pt will resume both medications to her usual dosage of once a day. Carrie Best read back the medication adjustment with verbal understanding. Carrie Best did say she had d/w PA in regards she has been giving the pt her Ativan to help with her breathing as she becomes anxious is harder for her to breathe. Carrie Best stated they have been referred to pulmonary for further evaluation of sob. Carrie Best also stated they have pulse ox to monitor SPO2 for the pt. I did advised if she see's that number go below 88 she needs to call either our office, Pulmonary, or PCP as that is too low. Carrie Best thanked me for the call and the help. Patient notified of result.  Please refer to phone note from today for complete details.   Danielle Rankin, CMA 03/06/2020 11:26 AM

## 2020-04-25 ENCOUNTER — Ambulatory Visit: Payer: Medicare Other | Admitting: Pulmonary Disease

## 2020-04-25 ENCOUNTER — Encounter: Payer: Self-pay | Admitting: Pulmonary Disease

## 2020-04-25 ENCOUNTER — Other Ambulatory Visit: Payer: Self-pay

## 2020-04-25 VITALS — BP 138/64 | HR 77 | Temp 97.5°F | Ht 68.0 in | Wt 180.0 lb

## 2020-04-25 DIAGNOSIS — G4733 Obstructive sleep apnea (adult) (pediatric): Secondary | ICD-10-CM | POA: Diagnosis not present

## 2020-04-25 NOTE — Progress Notes (Signed)
Carrie Best    [id number]    July 18, 1928  Primary Care Physician:Griffin, Jonny Ruiz, MD  Referring Physician: Beatrice Lecher, PA-C [address] [place],  [place] [address]  Chief complaint:    Patient with shortness of breath on exertion HPI:  She is wheelchair-bound most times but when she is transferring or doing any activity at home she does get short of breath  No underlying lung disease known  History of significant snoring, no witnessed apneas Son has obstructive sleep apnea  She is noted to have pulmonary hypertension on echocardiogram  Never smoker, some exposure to secondhand smoke No occupational history  She is deconditioned from having chronic knee pain  She has a history of atrial fibrillation, history of congestive heart failure-diastolic heart failure   Outpatient Encounter Medications as of 04/25/2020  Medication Sig  . apixaban (ELIQUIS) 5 MG TABS tablet Take 1 tablet (5 mg total) by mouth 2 (two) times daily.  Marland Kitchen atorvastatin (LIPITOR) 40 MG tablet Take 1 tablet (40 mg total) by mouth daily.  Marland Kitchen diltiazem (CARTIA XT) 180 MG 24 hr capsule Take 180 mg by mouth daily.  . DULoxetine (CYMBALTA) 60 MG capsule Take 60 mg by mouth daily.   . isosorbide mononitrate (IMDUR) 30 MG 24 hr tablet TAKE 1 TABLET(30 MG) BY MOUTH DAILY  . LORazepam (ATIVAN) 0.5 MG tablet Take 0.5 mg by mouth 3 (three) times daily as needed.  . metoprolol tartrate (LOPRESSOR) 50 MG tablet Take 50 mg by mouth 2 (two) times daily.  . polyethylene glycol (MIRALAX / GLYCOLAX) 17 g packet Take 17 g by mouth daily.  . Potassium Chloride ER 20 MEQ TBCR Take 1 tablet by mouth daily.  Marland Kitchen torsemide (DEMADEX) 20 MG tablet Take 1 tablet (20 mg total) by mouth daily.  Marland Kitchen zolpidem (AMBIEN) 10 MG tablet Take 10 mg by mouth at bedtime.  . hydrALAZINE (APRESOLINE) 10 MG tablet Take 1 tablet (10 mg total) by mouth 2 (two) times daily. (Patient taking differently: Take 10 mg by mouth 3  (three) times daily. )   No facility-administered encounter medications on file as of 04/25/2020.    Allergies as of 04/25/2020 - Review Complete 04/25/2020  Allergen Reaction Noted  . Codeine Nausea And Vomiting 03/18/2012  . Penicillins Hives 03/18/2012  . Sulfur Nausea And Vomiting 03/18/2012    Past Medical History:  Diagnosis Date  . Anxiety   . Arthritis   . Congestive heart failure (HCC)    Congestive heart failure symptoms  . Depression   . Diastolic dysfunction   . Dyspnea    occasionally, with exertion  . Dysrhythmia 05-10-12   hx. A. Fib., rate is controlled-tx. Xarelto  . GERD (gastroesophageal reflux disease) 05-10-12   tx. with meds as needed  . Hearing loss 05-10-12   bilateral hearing aids.  . Heart murmur   . History of kidney stones   . Hyperlipidemia   . Hypertension   . Pneumonia 05-10-12   dx. in July- tx. antibiotics  . Stroke (HCC) 05-10-12   Lt.CVA note on scans-hx. freq. falls/ none past 6 months 01/21/14  . Wears hearing aid    bilateral    Past Surgical History:  Procedure Laterality Date  . APPENDECTOMY    . CATARACT EXTRACTION, BILATERAL  05-10-12   bilateral  . CYSTOSCOPY WITH RETROGRADE PYELOGRAM, URETEROSCOPY AND STENT PLACEMENT Right 01/31/2014   Procedure: CYSTOSCOPY WITH bilateral  RETROGRADE PYELOGRAM, right URETEROSCOPY  AND right  STENT PLACEMENT, bladder biopsy with fulgeration;  Surgeon: Sebastian Ache, MD;  Location: [facility];  Service: Urology;  Laterality: Right;  . HARDWARE REMOVAL Right 09/09/2014   Procedure: HARDWARE REMOVAL RIGHT SHOULDER;  Surgeon: Mable Paris, MD;  Location: [facility];  Service: Orthopedics;  Laterality: Right;  Right shoulder hardware removal  . HOLMIUM LASER APPLICATION Right 01/31/2014   Procedure: HOLMIUM LASER APPLICATION;  Surgeon: Sebastian Ache, MD;  Location: [facility];  Service: Urology;  Laterality: Right;  . HUMERUS IM NAIL Right 06/10/2014   Procedure: INTRAMEDULLARY (IM) NAIL  HUMERAL;  Surgeon: Mable Paris, MD;  Location: [facility];  Service: Orthopedics;  Laterality: Right;  Right intramedullary humeral nail  . INTERSTIM IMPLANT PLACEMENT  05-10-12   now malfunctioning  . INTERSTIM IMPLANT REMOVAL  05/16/2012   Procedure: REMOVAL OF INTERSTIM IMPLANT;  Surgeon: Martina Sinner, MD;  Location: [facility];  Service: Urology;  Laterality: N/A;  . JOINT REPLACEMENT    . OTHER SURGICAL HISTORY     Bladder Surgery  . SHOULDER SURGERY    . TOTAL KNEE ARTHROPLASTY Right 02/17/2015   Procedure: TOTAL KNEE ARTHROPLASTY;  Surgeon: Gean Birchwood, MD;  Location: [facility];  Service: Orthopedics;  Laterality: Right;    Family History  Problem Relation Age of Onset  . Stroke Mother   . Stroke Brother     Social History   Socioeconomic History  . Marital status: Widowed    Spouse name: Not on file  . Number of children: Not on file  . Years of education: Not on file  . Highest education level: Not on file  Occupational History  . Not on file  Tobacco Use  . Smoking status: Never Smoker  . Smokeless tobacco: Never Used  Vaping Use  . Vaping Use: Never used  Substance and Sexual Activity  . Alcohol use: No  . Drug use: No  . Sexual activity: Never  Other Topics Concern  . Not on file  Social History Narrative  . Not on file   Social Determinants of Health   Financial Resource Strain:   . Difficulty of Paying Living Expenses: Not on file  Food Insecurity:   . Worried About Programme researcher, broadcasting/film/video in the Last Year: Not on file  . Ran Out of Food in the Last Year: Not on file  Transportation Needs:   . Lack of Transportation (Medical): Not on file  . Lack of Transportation (Non-Medical): Not on file  Physical Activity:   . Days of Exercise per Week: Not on file  . Minutes of Exercise per Session: Not on file  Stress:   . Feeling of Stress : Not on file  Social Connections:   . Frequency of Communication with Friends and Family: Not on file  . Frequency of  Social Gatherings with Friends and Family: Not on file  . Attends Religious Services: Not on file  . Active Member of Clubs or Organizations: Not on file  . Attends Banker Meetings: Not on file  . Marital Status: Not on file  Intimate Partner Violence:   . Fear of Current or Ex-Partner: Not on file  . Emotionally Abused: Not on file  . Physically Abused: Not on file  . Sexually Abused: Not on file    Review of Systems  Constitutional: Positive for fatigue.  Respiratory: Positive for shortness of breath. Negative for cough.   Psychiatric/Behavioral: Positive for sleep disturbance.    Vitals:  04/25/20 1411  BP: 138/64  Pulse: 77  Temp: (!) 97.5 F (36.4 C)  SpO2: 91%     Physical Exam Constitutional:      Appearance: Normal appearance.  HENT:     Head: Normocephalic.     Nose: No congestion.  Eyes:     Pupils: Pupils are equal, round, and reactive to light.  Cardiovascular:     Rate and Rhythm: Regular rhythm.     Pulses: Normal pulses.     Heart sounds: Normal heart sounds. No murmur heard.  No friction rub. No gallop.   Pulmonary:     Effort: Pulmonary effort is normal. No respiratory distress.     Breath sounds: Normal breath sounds. No stridor. No wheezing or rhonchi.  Musculoskeletal:     Cervical back: No rigidity or tenderness.  Skin:    General: Skin is warm.  Neurological:     General: No focal deficit present.     Mental Status: She is alert.  Psychiatric:        Mood and Affect: Mood normal.    Data Reviewed: Echocardiogram with moderate pulmonary hypertension  Assessment:  Moderate pulmonary hypertension  Significant snoring  Likely has nocturnal desaturations contributing to her pulmonary hypertension  No underlying lung disease by history  Plan/Recommendations: Schedule patient for home sleep study Daughter-in-law is a Engineer, civil (consulting) and will be able to help with set up  If significant obstructive sleep apnea then will  recommend CPAP treatment If borderline or no significant obstructive sleep apnea may need oxygen supplementation at night  Graded exercises as tolerated  I will follow-up in 6 to 8 weeks   Virl Diamond MD [facility] 04/25/2020, 3:14 PM  CC: Beatrice Lecher, PA-C  Home

## 2020-04-25 NOTE — Patient Instructions (Signed)
Shortness of breath Pulmonary hypertension  We will get a home sleep study If home sleep study is positive then we should consider CPAP treatment  If all we note on sleep study is oxygen desaturations We will need to do a test for oxygen supplementation at night  Graded exercises as tolerated  Follow-up in 8 weeks

## 2020-05-28 ENCOUNTER — Other Ambulatory Visit: Payer: Self-pay

## 2020-05-28 ENCOUNTER — Ambulatory Visit: Payer: Medicare Other

## 2020-05-28 DIAGNOSIS — G4733 Obstructive sleep apnea (adult) (pediatric): Secondary | ICD-10-CM

## 2020-06-11 ENCOUNTER — Telehealth: Payer: Self-pay | Admitting: Pulmonary Disease

## 2020-06-11 DIAGNOSIS — G4733 Obstructive sleep apnea (adult) (pediatric): Secondary | ICD-10-CM | POA: Diagnosis not present

## 2020-06-11 DIAGNOSIS — G479 Sleep disorder, unspecified: Secondary | ICD-10-CM

## 2020-06-11 DIAGNOSIS — J9601 Acute respiratory failure with hypoxia: Secondary | ICD-10-CM

## 2020-06-11 NOTE — Telephone Encounter (Signed)
Pt has been called and given sleep study recommendations has agreed to cpap titration study order has been placed in chart

## 2020-06-11 NOTE — Telephone Encounter (Signed)
Call patient  Sleep study result  Date of study: 05/28/2020  Impression: Mild obstructive sleep apnea Severe oxygen desaturations Nocturnal hypoxia likely related to known pulmonary hypertension   Recommendation: Recommend in lab titration for mild obstructive sleep apnea with severe oxygen desaturations We will require oxygen supplementation with titration and this can be best ascertained with an in lab study  An auto titrating machine will not address the oxygen supplementation that will be required, sleep apnea has to be adequately treated and then oxygen added to the system  Follow-up as previously scheduled  Call with significant concerns

## 2020-06-12 DIAGNOSIS — G4733 Obstructive sleep apnea (adult) (pediatric): Secondary | ICD-10-CM | POA: Diagnosis not present

## 2020-07-09 ENCOUNTER — Telehealth: Payer: Self-pay | Admitting: Pulmonary Disease

## 2020-07-09 NOTE — Telephone Encounter (Signed)
Called and spoke with [name] [facility]) who stated that pt's O2 has been dropping to the low 80s and she wants to try to get O2 for pt. Stated to [name] that we needed to have pt have OV with qualifying for O2 done and she verbalized understanding.  appt scheduled for pt tomorrow 11/4 with [facility]. Nothing further needed.

## 2020-07-10 ENCOUNTER — Encounter: Payer: Self-pay | Admitting: Primary Care

## 2020-07-10 ENCOUNTER — Ambulatory Visit (INDEPENDENT_AMBULATORY_CARE_PROVIDER_SITE_OTHER): Payer: Medicare Other

## 2020-07-10 ENCOUNTER — Ambulatory Visit: Payer: Medicare Other | Admitting: Primary Care

## 2020-07-10 ENCOUNTER — Other Ambulatory Visit: Payer: Self-pay

## 2020-07-10 VITALS — BP 118/68 | HR 70 | Temp 97.4°F | Ht 67.0 in | Wt 176.6 lb

## 2020-07-10 DIAGNOSIS — J449 Chronic obstructive pulmonary disease, unspecified: Secondary | ICD-10-CM

## 2020-07-10 DIAGNOSIS — I5032 Chronic diastolic (congestive) heart failure: Secondary | ICD-10-CM | POA: Diagnosis not present

## 2020-07-10 DIAGNOSIS — G4733 Obstructive sleep apnea (adult) (pediatric): Secondary | ICD-10-CM

## 2020-07-10 DIAGNOSIS — R410 Disorientation, unspecified: Secondary | ICD-10-CM | POA: Diagnosis not present

## 2020-07-10 DIAGNOSIS — G4734 Idiopathic sleep related nonobstructive alveolar hypoventilation: Secondary | ICD-10-CM

## 2020-07-10 DIAGNOSIS — I5033 Acute on chronic diastolic (congestive) heart failure: Secondary | ICD-10-CM

## 2020-07-10 DIAGNOSIS — J9601 Acute respiratory failure with hypoxia: Secondary | ICD-10-CM

## 2020-07-10 LAB — BASIC METABOLIC PANEL
BUN: 14 mg/dL (ref 6–23)
CO2: 34 mEq/L — ABNORMAL HIGH (ref 19–32)
Calcium: 9.8 mg/dL (ref 8.4–10.5)
Chloride: 97 mEq/L (ref 96–112)
Creatinine, Ser: 1.23 mg/dL — ABNORMAL HIGH (ref 0.40–1.20)
GFR: 38.63 mL/min — ABNORMAL LOW (ref >60.00)
Glucose, Bld: 133 mg/dL — ABNORMAL HIGH (ref 70–99)
Potassium: 3.4 mEq/L — ABNORMAL LOW (ref 3.5–5.1)
Sodium: 137 mEq/L (ref 135–145)

## 2020-07-10 LAB — BRAIN NATRIURETIC PEPTIDE: Pro B Natriuretic peptide (BNP): 446 pg/mL — ABNORMAL HIGH (ref 0.0–100.0)

## 2020-07-10 NOTE — Progress Notes (Signed)
@Patient  ID: , female    DOB: 1927/11/11, 86 y.o.   MRN: [id number]  Chief Complaint  Patient presents with  . Follow-up    sob, cough white, oxygen desat. to 77% after gettin up    Referring provider: [id number], MD  HPI: 86 year old female, never smoked.  Past medical history significant for obstructive sleep apnea.  Patient of Dr. 95, for initial consult on 04/25/2020.  Sleep study on 05/28/2020 showed mild obstructive sleep apnea, severe oxygen desaturations.  Nocturnal hypoxia likely related to pulmonary hypertension. In lab titration study scheduled for 07/16/2020.   07/10/2020 Patient presents today to qualify for oxygen. Hx mild OSA, nocturnal hypoxia, afib, CHF and pulmonary hypertension. Accompanied by her daughter and daughter-in-law. Family member called office on 07/09/20 with reports of O2 dropping into low 80s on roomair during the day. She does not current have oxygen at home. Patient was noted to be confused two days ago. She has improved since then. Daughter gave her 0.5mg  ativan FIVE times yesterday (for a total 2.5mg ) d/t anxiety and an additional 20mg  Torsemide tablet (total 40mg ) yesterday. She has moderate shortness of breath at baseline. Associated wheezing. No active cough. She is unable to ambulate d/t hip pain, in wheelchair. O2 dropped to 86% today on transfer, recovered to 90% with sitting. Symptoms are not new, daughter-in-law states that this has been going on for several months. Daughter feels she will not be able to do sleep study which is scheduled for November 11th but they would like to keep appointment for now. She is currently taking 10mg  Ambien at bedtime. Denies fever, chills, sweats, productive cough, chest tightness/pain.    Allergies  Allergen Reactions  . Codeine Nausea And Vomiting  . Penicillins Hives    Has patient had a PCN reaction causing immediate rash, facial/tongue/throat swelling, SOB or lightheadedness with  hypotension: No Has patient had a PCN reaction causing severe rash involving mucus membranes or skin necrosis: No Has patient had a PCN reaction that required hospitalization No Has patient had a PCN reaction occurring within the last 10 years: No If all of the above answers are "NO", then may proceed with Cephalosporin use.   . Sulfur Nausea And Vomiting    Immunization History  Administered Date(s) Administered  . Moderna SARS-COVID-2 Vaccination 05/15/2020, 06/12/2020  . Pneumococcal Polysaccharide-23 03/22/2012    Past Medical History:  Diagnosis Date  . Anxiety   . Arthritis   . Congestive heart failure (HCC)    Congestive heart failure symptoms  . Depression   . Diastolic dysfunction   . Dyspnea    occasionally, with exertion  . Dysrhythmia 05-10-12   hx. A. Fib., rate is controlled-tx. Xarelto  . GERD (gastroesophageal reflux disease) 05-10-12   tx. with meds as needed  . Hearing loss 05-10-12   bilateral hearing aids.  . Heart murmur   . History of kidney stones   . Hyperlipidemia   . Hypertension   . Pneumonia 05-10-12   dx. in July- tx. antibiotics  . Stroke (HCC) 05-10-12   Lt.CVA note on scans-hx. freq. falls/ none past 6 months 01/21/14  . Wears hearing aid    bilateral    Tobacco History: Social History   Tobacco Use  Smoking Status Never Smoker  Smokeless Tobacco Never Used   Counseling given: Not Answered   Outpatient Medications Prior to Visit  Medication Sig Dispense Refill  . apixaban (ELIQUIS) 5 MG TABS tablet Take 1 tablet (5 mg total) by  mouth 2 (two) times daily. 60 tablet 0  . atorvastatin (LIPITOR) 40 MG tablet Take 1 tablet (40 mg total) by mouth daily. 30 tablet 0  . diltiazem (CARTIA XT) 180 MG 24 hr capsule Take 180 mg by mouth daily.    . DULoxetine (CYMBALTA) 60 MG capsule Take 60 mg by mouth daily.     . isosorbide mononitrate (IMDUR) 30 MG 24 hr tablet TAKE 1 TABLET(30 MG) BY MOUTH DAILY 30 tablet 10  . LORazepam (ATIVAN) 0.5 MG tablet  Take 0.5 mg by mouth 3 (three) times daily as needed.    . metoprolol tartrate (LOPRESSOR) 50 MG tablet Take 50 mg by mouth 2 (two) times daily.    . polyethylene glycol (MIRALAX / GLYCOLAX) 17 g packet Take 17 g by mouth daily. 14 each 0  . Potassium Chloride ER 20 MEQ TBCR Take 1 tablet by mouth daily.    Marland Kitchen torsemide (DEMADEX) 20 MG tablet Take 1 tablet (20 mg total) by mouth daily. 90 tablet 2  . zolpidem (AMBIEN) 10 MG tablet Take 10 mg by mouth at bedtime.    . hydrALAZINE (APRESOLINE) 10 MG tablet Take 1 tablet (10 mg total) by mouth 2 (two) times daily. (Patient taking differently: Take 10 mg by mouth 3 (three) times daily. ) 60 tablet 0   No facility-administered medications prior to visit.    Review of Systems  Review of Systems  Constitutional: Positive for fatigue.  HENT: Negative.   Respiratory: Positive for shortness of breath and wheezing. Negative for cough and chest tightness.   Cardiovascular: Negative for chest pain and leg swelling.   Physical Exam  BP 118/68 (BP Location: Left Arm, Cuff Size: Normal)   Pulse 70   Temp (!) 97.4 F (36.3 C) (Temporal)   Ht 5\' 7"  (1.702 m)   Wt 176 lb 9.6 oz (80.1 kg)   SpO2 90%   BMI 27.66 kg/m  Physical Exam Constitutional:      Appearance: Normal appearance.  Cardiovascular:     Rate and Rhythm: Normal rate.     Comments: No edema Pulmonary:     Effort: Pulmonary effort is normal. No respiratory distress.     Breath sounds: Normal breath sounds. No stridor. No wheezing, rhonchi or rales.     Comments: Mostly clear throughout, no respiratory distress.  Musculoskeletal:     Comments: In WC  Skin:    General: Skin is warm and dry.  Neurological:     General: No focal deficit present.     Mental Status: She is alert. Mental status is at baseline.  Psychiatric:        Mood and Affect: Mood normal.      Lab Results:  CBC    Component Value Date/Time   WBC 8.3 03/04/2020 1626   WBC 9.3 06/25/2019 0348   RBC 3.91  03/04/2020 1626   RBC 4.48 06/25/2019 0348   HGB 11.7 03/04/2020 1626   HCT 36.3 03/04/2020 1626   PLT 200 03/04/2020 1626   MCV 93 03/04/2020 1626   MCH 29.9 03/04/2020 1626   MCH 30.8 06/25/2019 0348   MCHC 32.2 03/04/2020 1626   MCHC 35.1 06/25/2019 0348   RDW 14.4 03/04/2020 1626   LYMPHSABS 1.8 09/09/2018 1216   MONOABS 0.7 09/09/2018 1216   EOSABS 0.3 09/09/2018 1216   BASOSABS 0.0 09/09/2018 1216    BMET    Component Value Date/Time   NA 136 03/04/2020 1626   K 4.1 03/04/2020 1626  CL 99 03/04/2020 1626   CO2 28 03/04/2020 1626   GLUCOSE 95 03/04/2020 1626   GLUCOSE 105 (H) 06/26/2019 0206   BUN 19 03/04/2020 1626   CREATININE 1.11 (H) 03/04/2020 1626   CALCIUM 10.2 03/04/2020 1626   GFRNONAA 44 (L) 03/04/2020 1626   GFRAA 51 (L) 03/04/2020 1626    BNP    Component Value Date/Time   BNP 309.6 (H) 09/09/2018 1216    ProBNP    Component Value Date/Time   PROBNP 1,661 (H) 03/04/2020 1626   PROBNP 1,448.0 (H) 03/19/2012 1155    Imaging: DG Chest 2 View  Result Date: 07/10/2020 CLINICAL DATA:  COPD. EXAM: CHEST - 2 VIEW COMPARISON:  06/18/2019. FINDINGS: Mediastinum and hilar structures normal. Heart size stable. Diffuse bilateral prominent interstitial infiltrates and or edema noted. Small bilateral pleural effusions. No pneumothorax. Postsurgical changes right humerus. IMPRESSION: Diffuse bilateral prominent interstitial infiltrates and or edema. Pneumonitis and or CHF could present this fashion. Small bilateral pleural effusions. Electronically Signed   By: Maisie Fus  Register   On: 07/10/2020 12:27     Assessment & Plan:   OSA (obstructive sleep apnea) - Sleep study on 05/28/2020 showed mild obstructive sleep apnea, severe oxygen desaturations - Ordered for in lab titration study scheduled for 07/16/2020 - Recommend patient taper off ambien, to discuss with PCP   Nocturnal hypoxia - Nocturnal hypoxia likely related to pulmonary hypertension - Patient  needs to start supplemental oxygen at 2L   Acute respiratory failure with hypoxia (HCC) - Felt to be more acute on chronic in nature d/t hx afib, pulmonary hypertension and CHF - O2 86% on transfer today, improved to 90% with rest. Unable to perform ambulatory O2 saturation d/t hip pain/WC bound  - Checking BNP/BMET and CXR  - New oxygen start @ 2L continuously - FU first available with Dr. Wynona Neat    Acute on chronic diastolic CHF (congestive heart failure) (HCC) - Does not appear overtly fluid overloaded on exam today - Continue Torsemide 20mg  daily, may take additional tablet as needed for weight gain >3-5 lbs or leg swelling - Checking BNP and CXR  - Follow with cardiology   Acute confusion - Family noticed patient was more confused two days ago, this has improved - Checking urine culture and BMET - Recommend patient taper off Ambien and does not take ativan more than prescribed. She may benefit from nero/psych eval. She should follow-up with PCP.    , NP 07/10/2020

## 2020-07-10 NOTE — Assessment & Plan Note (Signed)
-   Sleep study on 05/28/2020 showed mild obstructive sleep apnea, severe oxygen desaturations - Ordered for in lab titration study scheduled for 07/16/2020 - Recommend patient taper off Remus Loffler, to discuss with PCP

## 2020-07-10 NOTE — Assessment & Plan Note (Signed)
-   Nocturnal hypoxia likely related to pulmonary hypertension - Patient needs to start supplemental oxygen at 2L

## 2020-07-10 NOTE — Patient Instructions (Addendum)
Recommendations: - Wear 1-2 L oxygen continuously to maintain O2 >88-90% - Do not given Ativan more than prescribed  - Continue Torsemide 20mg  daily, may take additional tablet if weight gain >3-5 lbs or leg swelling  - I would recommended discussing patient see neuro/psych with her PCP for medication management, I would prefer her to come off ambien all together (or lowering dose to 5mg ) this can worsen underlying confusion and sleep apnea   Orders: - Labs today - CXR today - Urine culture today  - Cancel cpap titration study   Referral: - DME for new oxygen start @ 1-2L continuous to keep O2>88-90%   Follow-up: - First available with Dr. only   Home Oxygen Use, Adult When a medical condition keeps you from getting enough oxygen, your health care provider may instruct you to take extra oxygen at home. Your health care provider will let you know:  When to take oxygen.  For how long to take oxygen.  How quickly oxygen should be delivered (flow rate), in liters per minute (LPM or L/M). Home oxygen can be given through:  A mask.  A nasal cannula. This is a device or tube that goes in the nostrils.  A transtracheal catheter. This is a small, flexible tube placed in the trachea.  A tracheostomy. This is a surgically made opening in the trachea. These devices are connected with tubing to an oxygen source, such as:  A tank. Tanks hold oxygen in gas form. They must be replaced when the oxygen is used up.  A liquid oxygen device. This holds oxygen in liquid form. It must be replaced when the oxygen is used up.  An oxygen concentrator machine. This filters oxygen in the room. It uses electricity, so you must have a backup cylinder of oxygen in case the power goes out. Supplies needed: To use oxygen, you will need:  A mask, nasal cannula, transtracheal catheter, or tracheostomy.  An oxygen tank, a liquid oxygen device, or an oxygen concentrator.  The tape that your  health care provider recommends (optional). If you use a transtracheal catheter and your prescribed flow rate is 1 LPM or greater, you will also need a humidifier. Risks and complications  Fire. This can happen if the oxygen is exposed to a heat source, flame, or spark.  Injury to skin. This can happen if liquid oxygen touches your skin.  Organ damage. This can happen if you get too little oxygen. How to use oxygen Your health care provider or a representative from your medical device company will show you how to use your oxygen device. Follow her or his instructions. The instructions may look something like this: 1. Wash your hands. 2. If you use an oxygen concentrator, make sure it is plugged in. 3. Place one end of the tube into the port on the tank, device, or machine. 4. Place the mask over your nose and mouth. Or, place the nasal cannula and secure it with tape if instructed. If you use a tracheostomy or transtracheal catheter, connect it to the oxygen source as directed. 5. Make sure the liter-flow setting on the machine is at the level prescribed by your health care provider. 6. Turn on the machine or adjust the knob on the tank or device to the correct liter-flow setting. 7. When you are done, turn off and unplug the machine, or turn the knob to OFF. How to clean and care for the oxygen supplies Nasal cannula  Clean it with a  warm, wet cloth daily or as needed.  Wash it with a liquid soap once a week.  Rinse it thoroughly once or twice a week.  Replace it every 2-4 weeks.  If you have an infection, such as a cold or pneumonia, change the cannula when you get better. Mask  Replace it every 2-4 weeks.  If you have an infection, such as a cold or pneumonia, change the mask when you get better. Humidifier bottle  Wash the bottle between each refill: ? Wash it with soap and warm water. ? Rinse it thoroughly. ? Disinfect it and its top. ? Air-dry it.  Make sure it is dry  before you refill it. Oxygen concentrator  Clean the air filter at least twice a week according to directions from your home medical equipment and service company.  Wipe down the cabinet every day. To do this: ? Unplug the unit. ? Wipe down the cabinet with a damp cloth. ? Dry the cabinet. Other equipment  Change any extra tubing every 1-3 months.  Follow instructions from your health care provider about taking care of any other equipment. Safety tips Fire safety tips   Keep your oxygen and oxygen supplies at least 5 ft away from sources of heat, flames, and sparks at all times.  Do not allow smoking near your oxygen. Put up "no smoking" signs in your home. Avoid smoking areas when in public.  Do not use materials that can burn (are flammable) while you use oxygen.  When you go to a restaurant with portable oxygen, ask to be seated in the nonsmoking section.  Keep a Government social research officer close by. Let your fire department know that you have oxygen in your home.  Test your home smoke detectors regularly. Traveling  Secure your oxygen tank in the vehicle so that it does not move around. Follow instructions from your medical device company about how to safely secure your tank.  Make sure you have enough oxygen for the amount of time you will be away from home.  If you are planning air travel, contact the airline to find out if they allow the use of an approved portable oxygen concentrator. You may also need documents from your health care provider and medical device company before you travel. General safety tips  If you use an oxygen cylinder, make sure it is in a stand or secured to an object that will not move (fixed object).  If you use liquid oxygen, make sure its container is kept upright.  If you use an oxygen concentrator: ? Catering manager company. Make sure you are given priority service in the event that your power goes out. ? Avoid using extension cords, if  possible. Follow these instructions at home:  Use oxygen only as told by your health care provider.  Do not use alcohol or other drugs that make you relax (sedating drugs) unless instructed. They can slow down your breathing rate and make it hard to get in enough oxygen.  Know how and when to order a refill of oxygen.  Always keep a spare tank of oxygen. Plan ahead for holidays when you may not be able to get a prescription filled.  Use water-based lubricants on your lips or nostrils. Do not use oil-based products like petroleum jelly.  To prevent skin irritation on your cheeks or behind your ears, tuck some gauze under the tubing. Contact a health care provider if:  You get headaches often.  You have shortness of breath.  You have a lasting cough.  You have anxiety.  You are sleepy all the time.  You develop an illness that affects your breathing.  You cannot exercise at your regular level.  You are restless.  You have difficult or irregular breathing, and it is getting worse.  You have a fever.  You have persistent redness under your nose. Get help right away if:  You are confused.  You have blue lips or fingernails.  You are struggling to breathe. Summary  Your health care provider or a representative from your medical device company will show you how to use your oxygen device. Follow her or his instructions.  If you use an oxygen concentrator, make sure it is plugged in.  Make sure the liter-flow setting on the machine is at the level prescribed by your health care provider.  Keep your oxygen and oxygen supplies at least 5 ft away from sources of heat, flames, and sparks at all times. This information is not intended to replace advice given to you by your health care provider. Make sure you discuss any questions you have with your health care provider. Document Revised: 02/09/2018 Document Reviewed: 03/16/2016 Elsevier Patient Education  2020 [employer].

## 2020-07-10 NOTE — Assessment & Plan Note (Signed)
-   Does not appear overtly fluid overloaded on exam today - Continue Torsemide 20mg  daily, may take additional tablet as needed for weight gain >3-5 lbs or leg swelling - Checking BNP and CXR  - Follow with cardiology

## 2020-07-10 NOTE — Assessment & Plan Note (Signed)
-   Felt to be more acute on chronic in nature d/t hx afib, pulmonary hypertension and CHF - O2 86% on transfer today, improved to 90% with rest. Unable to perform ambulatory O2 saturation d/t hip pain/WC bound  - Checking BNP/BMET and CXR  - New oxygen start @ 2L continuously - FU first available with Dr. Wynona Neat

## 2020-07-10 NOTE — Assessment & Plan Note (Signed)
-   Family noticed patient was more confused two days ago, this has improved - Checking urine culture and BMET - Recommend patient taper off Ambien and does not take ativan more than prescribed. She may benefit from nero/psych eval. She should follow-up with PCP.

## 2020-07-11 ENCOUNTER — Other Ambulatory Visit: Payer: Self-pay | Admitting: *Deleted

## 2020-07-11 ENCOUNTER — Telehealth: Payer: Self-pay | Admitting: Primary Care

## 2020-07-11 DIAGNOSIS — J189 Pneumonia, unspecified organism: Secondary | ICD-10-CM

## 2020-07-11 LAB — URINE CULTURE
MICRO NUMBER:: 11160844
Result:: NO GROWTH
SPECIMEN QUALITY:: ADEQUATE

## 2020-07-11 LAB — EXTRA URINE SPECIMEN

## 2020-07-11 MED ORDER — AZITHROMYCIN 250 MG PO TABS: ORAL_TABLET | ORAL | 0 refills | Status: DC

## 2020-07-11 NOTE — Telephone Encounter (Signed)
Carrie Bayley, NP  Carrie Best, Carrie Best, CMA Cc: P [facility] Triage Pool BNP was elevated and CXR showed infiltrates consistent with pneumonia and/or CHF. I will send in zpack and have her take additional torsemide and potassium tablet today. Needs repeat CXR in 4 weeks.     Called and spoke with [name] letting her know the results of labwork and cxr and also that [name] sent abx to pharmacy for pt. Also stated to her the other recs per [facility] and that we will have pt repeat cxr at upcoming f/u with AO. [name] verbalized understanding. Nothing further needed.

## 2020-07-11 NOTE — Addendum Note (Signed)
Addended by: Glenford Bayley on: 07/11/2020 09:12 AM   Modules accepted: Orders

## 2020-07-14 ENCOUNTER — Telehealth: Payer: Self-pay | Admitting: Pulmonary Disease

## 2020-07-14 DIAGNOSIS — G4734 Idiopathic sleep related nonobstructive alveolar hypoventilation: Secondary | ICD-10-CM

## 2020-07-14 NOTE — Telephone Encounter (Signed)
She can remain on oxygen supplementation knowing fully well that oxygen supplementation does not treat sleep apnea.  Yes she may not sleep well in the lab if she in a different environment and she is unable to sleep in a strange environment  It will not serve patient well if she goes to the lab and we know she will not sleep.  Study may be canceled if it will not be felt helpful to patient's health

## 2020-07-14 NOTE — Progress Notes (Signed)
Please let patient know urine culture showed no growth

## 2020-07-14 NOTE — Telephone Encounter (Signed)
Patient's daughter in law, [name] (DPR) is aware of below message and voiced her understanding.  [name] would like to cancel cpap titration--- PCC's can you guys help with this?  Dr. Wynona Neat, would you like to start cpap based off of HST results?

## 2020-07-14 NOTE — Telephone Encounter (Signed)
I called sleep lab and cancelled cpap titration that was scheduled for 11/10.

## 2020-07-14 NOTE — Telephone Encounter (Addendum)
Called and spoke to patient's daughter in [place], [name](DPR).  [name] is questioning if patient should still have cpap titration on 07/16/2020, as she now wears 2L QHS.  She is also concerned that patient will not sleep away from home. She is also questioning if the sleep techs will be able to assist patient, as she is immobile.  Dr. Wynona Neat, please advise. thanks

## 2020-07-15 NOTE — Telephone Encounter (Signed)
Let us schedule a nocturnal oximetry on oxygen supplementation  This will probably serve her as best as we can  If she already has significant sleep issues, CPAP may not be easy for her to get used to using, even though it is the most appropriate treatment, may not work for her with the risk of further disruption to her sleep.

## 2020-07-15 NOTE — Telephone Encounter (Signed)
lmtcb for daughter in law [name]

## 2020-07-16 ENCOUNTER — Telehealth: Payer: Self-pay

## 2020-07-16 ENCOUNTER — Encounter (HOSPITAL_BASED_OUTPATIENT_CLINIC_OR_DEPARTMENT_OTHER): Payer: Medicare Other | Admitting: Internal Medicine

## 2020-07-16 NOTE — Telephone Encounter (Signed)
Attempted to call pt/ daughter-in-law Carrie Best but unable to reach. Left message for them to return call. Will try to call back later before we close the message out.

## 2020-07-16 NOTE — Telephone Encounter (Signed)
Pt was left vm to return call back to office to get results.

## 2020-07-16 NOTE — Telephone Encounter (Signed)
-----   Message from Glenford Bayley, NP sent at 07/14/2020  9:26 AM EST ----- Please let patient know urine culture showed no growth

## 2020-07-17 ENCOUNTER — Other Ambulatory Visit: Payer: Self-pay | Admitting: Cardiovascular Disease

## 2020-07-17 NOTE — Telephone Encounter (Signed)
Spoke with [name] and notified her of response per Dr Wynona Neat  She verbalized understanding and ONO was ordered

## 2020-07-17 NOTE — Telephone Encounter (Signed)
Spoke with [name] and notified of results per [facility]  She verbalized understanding and nothing further needed

## 2020-07-17 NOTE — Telephone Encounter (Signed)
lmtcb for Carrie Best.

## 2020-07-23 ENCOUNTER — Other Ambulatory Visit: Payer: Self-pay | Admitting: Pulmonary Disease

## 2020-07-23 ENCOUNTER — Telehealth: Payer: Self-pay | Admitting: Pulmonary Disease

## 2020-07-23 MED ORDER — PREDNISONE 10 MG PO TABS: 10.0000 mg | ORAL_TABLET | Freq: Two times a day (BID) | ORAL | 0 refills | Status: DC

## 2020-07-23 NOTE — Telephone Encounter (Signed)
Primary Pulmonologist: [place] Last office visit and with whom: 07/10/2020 [name] What do we see them for (pulmonary problems): COPD and OSA Last OV assessment/plan:   Was appointment offered to patient (explain)?   Assessment & Plan Note by Glenford Bayley, NP at 07/10/2020 12:43 PM Author: Glenford Bayley, NP Author Type: Nurse Practitioner Filed: 07/10/2020 12:44 PM  Note Status: Written Cosign: Cosign Not Required Encounter Date: 07/10/2020  Problem: OSA (obstructive sleep apnea)  Editor: Glenford Bayley, NP (Nurse Practitioner)                 - Sleep study on 05/28/2020 showed mild obstructive sleep apnea, severe oxygen desaturations - Ordered for in lab titration study scheduled for 07/16/2020 - Recommend patient taper off Remus Loffler, to discuss with PCP     Patient Instructions by Glenford Bayley, NP at 07/10/2020 10:30 AM Author: Glenford Bayley, NP Author Type: Nurse Practitioner Filed: 07/10/2020 11:26 AM  Note Status: Addendum Cosign: Cosign Not Required Encounter Date: 07/10/2020  Editor: Glenford Bayley, NP (Nurse Practitioner)      Prior Versions: 1. Glenford Bayley, NP (Nurse Practitioner) at 07/10/2020 11:23 AM - Addendum   2. Glenford Bayley, NP (Nurse Practitioner) at 07/10/2020 11:22 AM - Addendum   3. Glenford Bayley, NP (Nurse Practitioner) at 07/10/2020 11:19 AM - Addendum   4. Glenford Bayley, NP (Nurse Practitioner) at 07/10/2020 11:16 AM - Addendum   5. Glenford Bayley, NP (Nurse Practitioner) at 07/10/2020 11:10 AM - Addendum   6. Glenford Bayley, NP (Nurse Practitioner) at 07/10/2020 11:00 AM - Signed       Recommendations: - Wear 1-2 L oxygen continuously to maintain O2 >88-90% - Do not given Ativan more than prescribed  - Continue Torsemide 20mg  daily, may take additional tablet if weight gain >3-5 lbs or leg swelling  - I would recommended discussing patient see neuro/psych with her PCP for medication management, I would prefer her  to come off ambien all together (or lowering dose to 5mg ) this can worsen underlying confusion and sleep apnea   Orders: - Labs today - CXR today - Urine culture today  - Cancel cpap titration study   Referral: - DME for new oxygen start @ 1-2L continuous to keep O2>88-90%   Follow-up: - First available with Dr. only   Home Oxygen Use, Adult  When a medical condition keeps you from getting enough oxygen, your health care provider may instruct you to take extra oxygen at home. Your health care provider will let you know:  When to take oxygen.  For how long to take oxygen.  How quickly oxygen should be delivered (flow rate), in liters per minute (LPM or L/M). Home oxygen can be given through:  A mask.  A nasal cannula. This is a device or tube that goes in the nostrils.  A transtracheal catheter. This is a small, flexible tube placed in the trachea.  A tracheostomy. This is a surgically made opening in the trachea. These devices are connected with tubing to an oxygen source, such as:  A tank. Tanks hold oxygen in gas form. They must be replaced when the oxygen is used up.  A liquid oxygen device. This holds oxygen in liquid form. It must be replaced when the oxygen is used up.  An oxygen concentrator machine. This filters oxygen in the room. It uses electricity, so you must have a backup cylinder of oxygen in case the power goes out.  Supplies needed: To use oxygen, you will need:  A mask, nasal cannula, transtracheal catheter, or tracheostomy.  An oxygen tank, a liquid oxygen device, or an oxygen concentrator.  The tape that your health care provider recommends (optional). If you use a transtracheal catheter and your prescribed flow rate is 1 LPM or greater, you will also need a humidifier. Risks and complications  Fire. This can happen if the oxygen is exposed to a heat source, flame, or spark.  Injury to skin. This can happen if liquid oxygen touches  your skin.  Organ damage. This can happen if you get too little oxygen. How to use oxygen  Your health care provider or a representative from your medical device company will show you how to use your oxygen device. Follow her or his instructions. The instructions may look something like this: 1. Wash your hands. 2. If you use an oxygen concentrator, make sure it is plugged in. 3. Place one end of the tube into the port on the tank, device, or machine. 4. Place the mask over your nose and mouth. Or, place the nasal cannula and secure it with tape if instructed. If you use a tracheostomy or transtracheal catheter, connect it to the oxygen source as directed. 5. Make sure the liter-flow setting on the machine is at the level prescribed by your health care provider. 6. Turn on the machine or adjust the knob on the tank or device to the correct liter-flow setting. 7. When you are done, turn off and unplug the machine, or turn the knob to OFF. How to clean and care for the oxygen supplies Nasal cannula  Clean it with a warm, wet cloth daily or as needed.  Wash it with a liquid soap once a week.  Rinse it thoroughly once or twice a week.  Replace it every 2-4 weeks.  If you have an infection, such as a cold or pneumonia, change the cannula when you get better. Mask  Replace it every 2-4 weeks.  If you have an infection, such as a cold or pneumonia, change the mask when you get better. Humidifier bottle  Wash the bottle between each refill: ? Wash it with soap and warm water. ? Rinse it thoroughly. ? Disinfect it and its top. ? Air-dry it.  Make sure it is dry before you refill it. Oxygen concentrator  Clean the air filter at least twice a week according to directions from your home medical equipment and service company.  Wipe down the cabinet every day. To do this: ? Unplug the unit. ? Wipe down the cabinet with a damp cloth. ? Dry the cabinet. Other equipment  Change any  extra tubing every 1-3 months.  Follow instructions from your health care provider about taking care of any other equipment. Safety tips Fire safety tips    Keep your oxygen and oxygen supplies at least 5 ft away from sources of heat, flames, and sparks at all times.  Do not allow smoking near your oxygen. Put up "no smoking" signs in your home. Avoid smoking areas when in public.  Do not use materials that can burn (are flammable) while you use oxygen.  When you go to a restaurant with portable oxygen, ask to be seated in the nonsmoking section.  Keep a Government social research officer close by. Let your fire department know that you have oxygen in your home.  Test your home smoke detectors regularly. Traveling  Secure your oxygen tank in the vehicle so that it  does not move around. Follow instructions from your medical device company about how to safely secure your tank.  Make sure you have enough oxygen for the amount of time you will be away from home.  If you are planning air travel, contact the airline to find out if they allow the use of an approved portable oxygen concentrator. You may also need documents from your health care provider and medical device company before you travel. General safety tips  If you use an oxygen cylinder, make sure it is in a stand or secured to an object that will not move (fixed object).  If you use liquid oxygen, make sure its container is kept upright.  If you use an oxygen concentrator: ? Catering manager company. Make sure you are given priority service in the event that your power goes out. ? Avoid using extension cords, if possible. Follow these instructions at home:  Use oxygen only as told by your health care provider.  Do not use alcohol or other drugs that make you relax (sedating drugs) unless instructed. They can slow down your breathing rate and make it hard to get in enough oxygen.  Know how and when to order a refill of  oxygen.  Always keep a spare tank of oxygen. Plan ahead for holidays when you may not be able to get a prescription filled.  Use water-based lubricants on your lips or nostrils. Do not use oil-based products like petroleum jelly.  To prevent skin irritation on your cheeks or behind your ears, tuck some gauze under the tubing. Contact a health care provider if:  You get headaches often.  You have shortness of breath.  You have a lasting cough.  You have anxiety.  You are sleepy all the time.  You develop an illness that affects your breathing.  You cannot exercise at your regular level.  You are restless.  You have difficult or irregular breathing, and it is getting worse.  You have a fever.  You have persistent redness under your nose. Get help right away if:  You are confused.  You have blue lips or fingernails.  You are struggling to breathe. Summary  Your health care provider or a representative from your medical device company will show you how to use your oxygen device. Follow her or his instructions.  If you use an oxygen concentrator, make sure it is plugged in.  Make sure the liter-flow setting on the machine is at the level prescribed by your health care provider.  Keep your oxygen and oxygen supplies at least 5 ft away from sources of heat, flames, and sparks at all times. This information is not intended to replace advice given to you by your health care provider. Make sure you discuss any questions you have with your health care provider. Document Revised: 02/09/2018 Document Reviewed: 03/16/2016 Elsevier Patient Education  2020 [employer].     Instructions    Return in about 3 weeks (around 07/31/2020).  Recommendations: - Wear 1-2 L oxygen continuously to maintain O2 >88-90% - Do not given Ativan more than prescribed  - Continue Torsemide 20mg  daily, may take additional tablet if weight gain >3-5 lbs or leg swelling  - I would recommended  discussing patient see neuro/psych with her PCP for medication management, I would prefer her to come off ambien all together (or lowering dose to 5mg ) this can worsen underlying confusion and sleep apnea   Orders: - Labs today - CXR today - Urine culture  today  - Cancel cpap titration study   Referral: - DME for new oxygen start @ 1-2L continuous to keep O2>88-90%   Follow-up: - First available with Dr. Wynona Neatlalere only     Reason for call:  Sob, sats dropping.  Sats are 82% on 2L, increased to 3L and is now 93% on 3L.  Nose running, all clear, not new.  Denies cough, chills, fever, body aches.  Fully vaccinated against covid and flu.  Has some puffiness to BLE, nothing unusual.  She is sleeping a lot, but this has been increasing over the last month as they have had problems keeping her oxygen on and levels up.  Daughter in law states she does not want to go to the hospital.  Please advise.  (examples of things to ask: : When did symptoms start? Fever? Cough? Productive? Color to sputum? More sputum than usual? Wheezing? Have you needed increased oxygen? Are you taking your respiratory medications? What over the counter measures have you tried?)  Allergies  Allergen Reactions  . Codeine Nausea And Vomiting  . Penicillins Hives    Has patient had a PCN reaction causing immediate rash, facial/tongue/throat swelling, SOB or lightheadedness with hypotension: No Has patient had a PCN reaction causing severe rash involving mucus membranes or skin necrosis: No Has patient had a PCN reaction that required hospitalization No Has patient had a PCN reaction occurring within the last 10 years: No If all of the above answers are "NO", then may proceed with Cephalosporin use.   . Sulfur Nausea And Vomiting    Immunization History  Administered Date(s) Administered  . Moderna SARS-COVID-2 Vaccination 05/15/2020, 06/12/2020  . Pneumococcal Polysaccharide-23 03/22/2012

## 2020-07-23 NOTE — Telephone Encounter (Signed)
Called and spoke with daughter, [name], listed on [place].  Advised of recommendations per Dr. Wynona Neat.  She verbalized understanding.  Nothing further needed.

## 2020-07-23 NOTE — Telephone Encounter (Signed)
Does not sound like she is acutely ill  -Keep oxygen at 3 L  -Goal should be to maintain saturations greater than 89 to 90%, may titrate oxygen to achieve this  -If she were to get any symptoms suggesting an infection then call, so that we can get on an antibiotic  -I will call in prednisone 10 mg twice a day-use this for 3 to 5 days, the role of this is to help with any inflammation  -Encourage activity as able so that she is not sleeping all the time unless she feels she is getting sick  Call with ongoing concerns

## 2020-07-24 ENCOUNTER — Encounter (HOSPITAL_COMMUNITY): Payer: Self-pay | Admitting: Internal Medicine

## 2020-07-24 ENCOUNTER — Telehealth: Payer: Self-pay | Admitting: Pulmonary Disease

## 2020-07-24 ENCOUNTER — Other Ambulatory Visit: Payer: Self-pay

## 2020-07-24 ENCOUNTER — Inpatient Hospital Stay (HOSPITAL_COMMUNITY)
Admission: EM | Admit: 2020-07-24 | Discharge: 2020-07-27 | DRG: 291 | Disposition: A | Discharge status: Home Health | Payer: Medicare Other | Attending: Family Medicine | Admitting: Family Medicine

## 2020-07-24 ENCOUNTER — Emergency Department (HOSPITAL_COMMUNITY): Payer: Medicare Other

## 2020-07-24 DIAGNOSIS — E871 Hypo-osmolality and hyponatremia: Secondary | ICD-10-CM | POA: Diagnosis present

## 2020-07-24 DIAGNOSIS — J189 Pneumonia, unspecified organism: Secondary | ICD-10-CM

## 2020-07-24 DIAGNOSIS — G4734 Idiopathic sleep related nonobstructive alveolar hypoventilation: Secondary | ICD-10-CM | POA: Diagnosis not present

## 2020-07-24 DIAGNOSIS — Z87442 Personal history of urinary calculi: Secondary | ICD-10-CM

## 2020-07-24 DIAGNOSIS — R7989 Other specified abnormal findings of blood chemistry: Secondary | ICD-10-CM | POA: Diagnosis not present

## 2020-07-24 DIAGNOSIS — I10 Essential (primary) hypertension: Secondary | ICD-10-CM

## 2020-07-24 DIAGNOSIS — R0689 Other abnormalities of breathing: Secondary | ICD-10-CM

## 2020-07-24 DIAGNOSIS — Z7901 Long term (current) use of anticoagulants: Secondary | ICD-10-CM

## 2020-07-24 DIAGNOSIS — J449 Chronic obstructive pulmonary disease, unspecified: Secondary | ICD-10-CM | POA: Diagnosis present

## 2020-07-24 DIAGNOSIS — Z885 Allergy status to narcotic agent status: Secondary | ICD-10-CM | POA: Diagnosis not present

## 2020-07-24 DIAGNOSIS — Z974 Presence of external hearing-aid: Secondary | ICD-10-CM

## 2020-07-24 DIAGNOSIS — K5900 Constipation, unspecified: Secondary | ICD-10-CM | POA: Diagnosis present

## 2020-07-24 DIAGNOSIS — I5033 Acute on chronic diastolic (congestive) heart failure: Secondary | ICD-10-CM | POA: Diagnosis present

## 2020-07-24 DIAGNOSIS — E785 Hyperlipidemia, unspecified: Secondary | ICD-10-CM

## 2020-07-24 DIAGNOSIS — I509 Heart failure, unspecified: Secondary | ICD-10-CM | POA: Diagnosis not present

## 2020-07-24 DIAGNOSIS — F32A Depression, unspecified: Secondary | ICD-10-CM | POA: Diagnosis present

## 2020-07-24 DIAGNOSIS — R269 Unspecified abnormalities of gait and mobility: Secondary | ICD-10-CM | POA: Diagnosis present

## 2020-07-24 DIAGNOSIS — Z9981 Dependence on supplemental oxygen: Secondary | ICD-10-CM

## 2020-07-24 DIAGNOSIS — F419 Anxiety disorder, unspecified: Secondary | ICD-10-CM | POA: Diagnosis present

## 2020-07-24 DIAGNOSIS — H9193 Unspecified hearing loss, bilateral: Secondary | ICD-10-CM | POA: Diagnosis present

## 2020-07-24 DIAGNOSIS — Z8673 Personal history of transient ischemic attack (TIA), and cerebral infarction without residual deficits: Secondary | ICD-10-CM

## 2020-07-24 DIAGNOSIS — K219 Gastro-esophageal reflux disease without esophagitis: Secondary | ICD-10-CM | POA: Diagnosis present

## 2020-07-24 DIAGNOSIS — R0600 Dyspnea, unspecified: Secondary | ICD-10-CM

## 2020-07-24 DIAGNOSIS — T380X5A Adverse effect of glucocorticoids and synthetic analogues, initial encounter: Secondary | ICD-10-CM | POA: Diagnosis not present

## 2020-07-24 DIAGNOSIS — I110 Hypertensive heart disease with heart failure: Principal | ICD-10-CM | POA: Diagnosis present

## 2020-07-24 DIAGNOSIS — Z20822 Contact with and (suspected) exposure to covid-19: Secondary | ICD-10-CM | POA: Diagnosis present

## 2020-07-24 DIAGNOSIS — R0602 Shortness of breath: Secondary | ICD-10-CM | POA: Diagnosis present

## 2020-07-24 DIAGNOSIS — Z880 Allergy status to penicillin: Secondary | ICD-10-CM

## 2020-07-24 DIAGNOSIS — I4891 Unspecified atrial fibrillation: Secondary | ICD-10-CM | POA: Diagnosis present

## 2020-07-24 DIAGNOSIS — Z823 Family history of stroke: Secondary | ICD-10-CM

## 2020-07-24 DIAGNOSIS — G4733 Obstructive sleep apnea (adult) (pediatric): Secondary | ICD-10-CM

## 2020-07-24 DIAGNOSIS — M1990 Unspecified osteoarthritis, unspecified site: Secondary | ICD-10-CM | POA: Diagnosis present

## 2020-07-24 DIAGNOSIS — I482 Chronic atrial fibrillation, unspecified: Secondary | ICD-10-CM | POA: Diagnosis not present

## 2020-07-24 DIAGNOSIS — D72829 Elevated white blood cell count, unspecified: Secondary | ICD-10-CM | POA: Diagnosis not present

## 2020-07-24 DIAGNOSIS — J9621 Acute and chronic respiratory failure with hypoxia: Secondary | ICD-10-CM | POA: Diagnosis present

## 2020-07-24 DIAGNOSIS — Y95 Nosocomial condition: Secondary | ICD-10-CM

## 2020-07-24 DIAGNOSIS — Z91048 Other nonmedicinal substance allergy status: Secondary | ICD-10-CM

## 2020-07-24 LAB — RESP PANEL BY RT-PCR (FLU A&B, COVID) ARPGX2
Influenza A by PCR: NEGATIVE
Influenza B by PCR: NEGATIVE
SARS Coronavirus 2 by RT PCR: NEGATIVE

## 2020-07-24 LAB — BASIC METABOLIC PANEL
Anion gap: 7 (ref 5–15)
BUN: 12 mg/dL (ref 8–23)
CO2: 32 mmol/L (ref 22–32)
Calcium: 10 mg/dL (ref 8.9–10.3)
Chloride: 94 mmol/L — ABNORMAL LOW (ref 98–111)
Creatinine, Ser: 0.9 mg/dL (ref 0.44–1.00)
GFR, Estimated: >60 mL/min (ref >60)
Glucose, Bld: 129 mg/dL — ABNORMAL HIGH (ref 70–99)
Potassium: 4.4 mmol/L (ref 3.5–5.1)
Sodium: 133 mmol/L — ABNORMAL LOW (ref 135–145)

## 2020-07-24 LAB — CBC WITH DIFFERENTIAL/PLATELET
Abs Immature Granulocytes: 0.05 10*3/uL (ref 0.00–0.07)
Basophils Absolute: 0.1 10*3/uL (ref 0.0–0.1)
Basophils Relative: 1 %
Eosinophils Absolute: 0.1 10*3/uL (ref 0.0–0.5)
Eosinophils Relative: 1 %
HCT: 40.6 % (ref 36.0–46.0)
Hemoglobin: 12.9 g/dL (ref 12.0–15.0)
Immature Granulocytes: 0 %
Lymphocytes Relative: 11 %
Lymphs Abs: 1.3 10*3/uL (ref 0.7–4.0)
MCH: 30.3 pg (ref 26.0–34.0)
MCHC: 31.8 g/dL (ref 30.0–36.0)
MCV: 95.3 fL (ref 80.0–100.0)
Monocytes Absolute: 0.9 10*3/uL (ref 0.1–1.0)
Monocytes Relative: 7 %
Neutro Abs: 9.5 10*3/uL — ABNORMAL HIGH (ref 1.7–7.7)
Neutrophils Relative %: 80 %
Platelets: 220 10*3/uL (ref 150–400)
RBC: 4.26 MIL/uL (ref 3.87–5.11)
RDW: 14.7 % (ref 11.5–15.5)
WBC: 12 10*3/uL — ABNORMAL HIGH (ref 4.0–10.5)
nRBC: 0 % (ref 0.0–0.2)

## 2020-07-24 LAB — TROPONIN I (HIGH SENSITIVITY)
Troponin I (High Sensitivity): 11 ng/L (ref <18)
Troponin I (High Sensitivity): 12 ng/L (ref <18)

## 2020-07-24 LAB — BRAIN NATRIURETIC PEPTIDE: B Natriuretic Peptide: 429 pg/mL — ABNORMAL HIGH (ref 0.0–100.0)

## 2020-07-24 LAB — PROCALCITONIN: Procalcitonin: <0.1 ng/mL

## 2020-07-24 MED ORDER — ATORVASTATIN CALCIUM 40 MG PO TABS
40.0000 mg | ORAL_TABLET | Freq: Every day | ORAL | Status: DC
  Administered 2020-07-25 – 2020-07-27 (×3): 40 mg via ORAL
  Filled 2020-07-24 (×4): qty 1

## 2020-07-24 MED ORDER — APIXABAN 5 MG PO TABS
5.0000 mg | ORAL_TABLET | Freq: Two times a day (BID) | ORAL | Status: DC
  Administered 2020-07-24 – 2020-07-27 (×6): 5 mg via ORAL
  Filled 2020-07-24 (×7): qty 1

## 2020-07-24 MED ORDER — DILTIAZEM HCL ER COATED BEADS 180 MG PO CP24
180.0000 mg | ORAL_CAPSULE | Freq: Every day | ORAL | Status: DC
  Administered 2020-07-25 – 2020-07-27 (×3): 180 mg via ORAL
  Filled 2020-07-24 (×4): qty 1

## 2020-07-24 MED ORDER — METOPROLOL TARTRATE 50 MG PO TABS
50.0000 mg | ORAL_TABLET | Freq: Two times a day (BID) | ORAL | Status: DC
  Administered 2020-07-25 – 2020-07-27 (×5): 50 mg via ORAL
  Filled 2020-07-24 (×6): qty 1

## 2020-07-24 MED ORDER — SODIUM CHLORIDE 0.9 % IV SOLN
500.0000 mg | INTRAVENOUS | Status: DC
  Administered 2020-07-24 – 2020-07-26 (×3): 500 mg via INTRAVENOUS
  Filled 2020-07-24 (×3): qty 500

## 2020-07-24 MED ORDER — FUROSEMIDE 10 MG/ML IJ SOLN
40.0000 mg | Freq: Two times a day (BID) | INTRAMUSCULAR | Status: DC
  Administered 2020-07-25 – 2020-07-27 (×5): 40 mg via INTRAVENOUS
  Filled 2020-07-24 (×6): qty 4

## 2020-07-24 MED ORDER — SODIUM CHLORIDE 0.9 % IV SOLN
1.0000 g | Freq: Once | INTRAVENOUS | Status: AC
  Administered 2020-07-24: 1 g via INTRAVENOUS
  Filled 2020-07-24: qty 10

## 2020-07-24 MED ORDER — FUROSEMIDE 10 MG/ML IJ SOLN
40.0000 mg | Freq: Once | INTRAMUSCULAR | Status: AC
  Administered 2020-07-24: 40 mg via INTRAVENOUS
  Filled 2020-07-24: qty 4

## 2020-07-24 MED ORDER — PREDNISONE 10 MG PO TABS
10.0000 mg | ORAL_TABLET | Freq: Two times a day (BID) | ORAL | Status: DC
  Administered 2020-07-25 – 2020-07-27 (×6): 10 mg via ORAL
  Filled 2020-07-24 (×7): qty 1

## 2020-07-24 MED ORDER — POLYETHYLENE GLYCOL 3350 17 G PO PACK
17.0000 g | PACK | Freq: Every day | ORAL | Status: DC
  Administered 2020-07-25 – 2020-07-27 (×3): 17 g via ORAL
  Filled 2020-07-24 (×4): qty 1

## 2020-07-24 MED ORDER — ALBUTEROL SULFATE HFA 108 (90 BASE) MCG/ACT IN AERS
6.0000 | INHALATION_SPRAY | Freq: Once | RESPIRATORY_TRACT | Status: AC
  Administered 2020-07-24: 6 via RESPIRATORY_TRACT
  Filled 2020-07-24: qty 6.7

## 2020-07-24 NOTE — TOC Initial Note (Addendum)
Transition of Care [facility]) - Initial/Assessment Note    Patient Details  Name: Carrie Best MRN: [id number] Date of Birth: [birth year]  [facility]) CM/SW Contact:    Villa Herb, LCSWA Phone Number: 07/24/2020, 8:28 PM  Clinical Narrative:                 Pt admitted for acute exacerbation of CHF. TOC received consult for CHF screen. CSW spoke with pts daughter law Angelis Gates [phone] to complete assessment. Pt lives with her son and daughter in law. Per DIL pt can feed herself however she needs complete assistance with bathing and dressing. Pt has used [facility] for Pt over a year ago after she had a stroke. Pt has a wheelchair, bed rails, and bathroom rails. Pt is on home O2 2L supplied through [employer] though pts daughter in law states over last few days they have had to increase liter flow. Per pts DIL pt sleeps in a regular bed and they are looking for a speciality recliner for pt because they do not think hospital bed will change how pt sleeps in bed with sliding down. Pts DIL states that she spoke with [facility] medical supply in [place] who thinks they would be able to supply pt with infinity mobility recliner as long as they had an order. Pts DIL states she attempted to contact pts pulmonologist about writing order and has not heard back. DIL asking if TOC would have any ideas about how she could get this order. F/U with pts DIL. Pts daughter in law states that the plan is for pt to return home once medically stable.   CSW completed CHF screen with pts daughter in law. Pt cannot be weighed daily due to inability to stand. Pt somewhat follows a heart healthy diet. Pts daughter in law states that they limit pts sodium, however pt has had decrease in appetite recently. Pts cardiologist is Dr. Melburn Popper. Pts daughter in law ensures that pt takes all medications as prescribed. TOC to follow.  Expected Discharge Plan: Home w Home Health Services Barriers to Discharge: Continued  Medical Work up   Patient Goals and CMS Choice Patient states their goals for this hospitalization and ongoing recovery are:: Return home   Choice offered to / list presented to : NA  Expected Discharge Plan and Services Expected Discharge Plan: Home w Home Health Services In-house Referral: Clinical Social Work Discharge Planning Services: CM Consult Post Acute Care Choice: Home Health Living arrangements for the past 2 months: Single Family Home                                      Prior Living Arrangements/Services Living arrangements for the past 2 months: Single Family Home Lives with:: Adult Children Patient language and need for interpreter reviewed:: Yes Do you feel safe going back to the place where you live?: Yes        Care giver support system in place?: Yes (comment) (Schnurr,[name] (Relative) [phone]) Current home services: DME (Wheelchair, bed rails, bathroom rails.) Criminal Activity/Legal Involvement Pertinent to Current Situation/Hospitalization: No - Comment as needed  Activities of Daily Living      Permission Sought/Granted                  Emotional Assessment       Orientation: : Oriented to Self, Oriented to Place, Oriented to  Time Alcohol /  Substance Use: Not Applicable Psych Involvement: No (comment)  Admission diagnosis:  Acute exacerbation of CHF (congestive heart failure) (HCC) [I50.9] Patient Active Problem List   Diagnosis Date Noted  . Acute exacerbation of CHF (congestive heart failure) (HCC) 07/24/2020  . OSA (obstructive sleep apnea) 07/10/2020  . Nocturnal hypoxia 07/10/2020  . Acute confusion 07/10/2020  . CVA (cerebral vascular accident) (HCC) 06/19/2019  . Acute encephalopathy 06/18/2019  . Acute respiratory failure with hypoxia (HCC) 09/10/2018  . Chronic kidney disease (CKD), stage II (mild) 09/10/2018  . Amyloidosis of bladder (HCC) 09/10/2018  . CHF exacerbation (HCC) 09/09/2018  . ARF (acute renal  failure) (HCC) 07/10/2018  . Nausea & vomiting 07/09/2018  . Hypokalemia 12/25/2017  . Acute on chronic diastolic CHF (congestive heart failure) (HCC) 12/24/2017  . 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 Rupture of right patellar tendon 05/19/2016  . H/O nonunion of fracture 03/16/2016  . Arthritis of knee 02/17/2015  . Primary osteoarthritis of right knee Valgus 02/14/2015  . Chronic diastolic CHF (congestive heart failure) (HCC) 02/06/2015  . Proximal humerus fracture 06/10/2014  . Obesity 03/18/2012  . HTN (hypertension) 03/18/2012  . GERD (gastroesophageal reflux disease) 03/18/2012  . Hyperlipidemia with target LDL less than 70 03/18/2012  . Sleep disturbance 03/18/2012  . Atrial fibrillation (HCC) 03/18/2012   PCP:  Kirby Funk, MD Pharmacy:   [facility] - [place], [place] - [address] [place] [address] Phone: [phone] Fax: [phone]     Social Determinants of Health (SDOH) Interventions    Readmission Risk Interventions No flowsheet data found.

## 2020-07-24 NOTE — ED Triage Notes (Signed)
Pt has a hisory of COPD and A fib. Endorses SOB before she came here. Pt on 4L [place] chronically. Family wants her evaluated. Pt doesn't ambulate. This is new in the last week.

## 2020-07-24 NOTE — ED Notes (Signed)
Report given to Tiffany.  Patient to be admitted to room 323.

## 2020-07-24 NOTE — Telephone Encounter (Signed)
Called the pt, no answer LMTCB

## 2020-07-24 NOTE — H&P (Signed)
History and Physical  Carrie Best:[id number] DOB: 07-Feb-1928 DOA: 07/24/2020  Referring physician: Shon Baton, MD PCP: Kirby Funk, MD  Patient coming from: Home  Chief Complaint: Shortness of breath  HPI: Carrie Best is a 86 y.o. female with medical history significant for CHF, hypertension, hyperlipidemia, COPD on supplemental oxygen at 3 LPM at home, afebrile Eliquis, constipation and OSA (not on CPAP) who presents to the emergency department due to 3-day history of worsening shortness of breath which was worse at night.  She does not believe that shortness of breath was related to lying flat, she denies chest pain, increased leg swelling, fever, chills, cough. Patient follows with Dr. Virl Diamond (pulmonologist) and she was recently diagnosed to have mild obstructive sleep apnea with severe oxygen desaturations.  She saw her pulmonologist yesterday and she was prescribed with prednisone 10 mg twice a day for 3 to 5 days, however, patient is yet to start with this medication.  She denies any sick contacts and she states that she has been vaccinated for COVID-19.  ED Course:  In the emergency department, she was initially tachypneic.  Other vital signs are within normal range except for BP at 154/79 on arrival to the ED.  Work-up in the ED showed leukocytosis, mild hyponatremia, elevated BNP, troponin x2 11>12.  Respiratory panel for influenza A, B and SARS coronavirus 2 was negative.  Chest x-ray showed Suspected congestive heart failure with mild cardiomegaly, pulmonary vascular congestion and likely mild interstitial edema. Increased small to moderate right pleural effusion.  Breathing treatment with albuterol was provided, IV Lasix 40 Mg x1 was given and patient was empirically started on IV antibiotics for presumed community-acquired pneumonia.  Hospitalist was asked to admit patient for further evaluation management.   Review of Systems: Constitutional: Negative  for chills and fever.  HENT: Negative for ear pain and sore throat.   Eyes: Negative for pain and visual disturbance.  Respiratory: Positive for shortness of breath.  Negative for cough and chest tightness  Cardiovascular: Negative for chest pain and palpitations.  Gastrointestinal: Negative for abdominal pain and vomiting.  Endocrine: Negative for polyphagia and polyuria.  Genitourinary: Negative for decreased urine volume, dysuria, enuresis Musculoskeletal: Negative for arthralgias and back pain.  Skin: Negative for color change and rash.  Allergic/Immunologic: Negative for immunocompromised state.  Neurological: Negative for tremors, syncope, speech difficulty, weakness, light-headedness and headaches.  Hematological: Does not bruise/bleed easily.  All other systems reviewed and are negative    Past Medical History:  Diagnosis Date  . Anxiety   . Arthritis   . Congestive heart failure (HCC)    Congestive heart failure symptoms  . Depression   . Diastolic dysfunction   . Dyspnea    occasionally, with exertion  . Dysrhythmia 05-10-12   hx. A. Fib., rate is controlled-tx. Xarelto  . GERD (gastroesophageal reflux disease) 05-10-12   tx. with meds as needed  . Hearing loss 05-10-12   bilateral hearing aids.  . Heart murmur   . History of kidney stones   . Hyperlipidemia   . Hypertension   . Pneumonia 05-10-12   dx. in July- tx. antibiotics  . Stroke (HCC) 05-10-12   Lt.CVA note on scans-hx. freq. falls/ none past 6 months 01/21/14  . Wears hearing aid    bilateral   Past Surgical History:  Procedure Laterality Date  . APPENDECTOMY    . CATARACT EXTRACTION, BILATERAL  05-10-12   bilateral  . CYSTOSCOPY WITH RETROGRADE PYELOGRAM, URETEROSCOPY AND  STENT PLACEMENT Right 01/31/2014   Procedure: CYSTOSCOPY WITH bilateral  RETROGRADE PYELOGRAM, right URETEROSCOPY AND right  STENT PLACEMENT, bladder biopsy with fulgeration;  Surgeon: Sebastian Acheheodore Manny, MD;  Location: [facility];  Service: Urology;   Laterality: Right;  . HARDWARE REMOVAL Right 09/09/2014   Procedure: HARDWARE REMOVAL RIGHT SHOULDER;  Surgeon: Mable ParisJustin William Chandler, MD;  Location: [facility];  Service: Orthopedics;  Laterality: Right;  Right shoulder hardware removal  . HOLMIUM LASER APPLICATION Right 01/31/2014   Procedure: HOLMIUM LASER APPLICATION;  Surgeon: Sebastian Acheheodore Manny, MD;  Location: [facility];  Service: Urology;  Laterality: Right;  . HUMERUS IM NAIL Right 06/10/2014   Procedure: INTRAMEDULLARY (IM) NAIL HUMERAL;  Surgeon: Mable ParisJustin William Chandler, MD;  Location: [facility];  Service: Orthopedics;  Laterality: Right;  Right intramedullary humeral nail  . INTERSTIM IMPLANT PLACEMENT  05-10-12   now malfunctioning  . INTERSTIM IMPLANT REMOVAL  05/16/2012   Procedure: REMOVAL OF INTERSTIM IMPLANT;  Surgeon: Martina SinnerScott A MacDiarmid, MD;  Location: [facility];  Service: Urology;  Laterality: N/A;  . JOINT REPLACEMENT    . OTHER SURGICAL HISTORY     Bladder Surgery  . SHOULDER SURGERY    . TOTAL KNEE ARTHROPLASTY Right 02/17/2015   Procedure: TOTAL KNEE ARTHROPLASTY;  Surgeon: Gean BirchwoodFrank Rowan, MD;  Location: [facility];  Service: Orthopedics;  Laterality: Right;    Social History:  reports that she has never smoked. She has never used smokeless tobacco. She reports that she does not drink alcohol and does not use drugs.   Allergies  Allergen Reactions  . Codeine Nausea And Vomiting  . Penicillins Hives    Has patient had a PCN reaction causing immediate rash, facial/tongue/throat swelling, SOB or lightheadedness with hypotension: No Has patient had a PCN reaction causing severe rash involving mucus membranes or skin necrosis: No Has patient had a PCN reaction that required hospitalization No Has patient had a PCN reaction occurring within the last 10 years: No If all of the above answers are "NO", then may proceed with Cephalosporin use.   . Sulfur Nausea And Vomiting    Family History  Problem Relation Age of Onset  .  Stroke Mother   . Stroke Brother     Prior to Admission medications   Medication Sig Start Date End Date Taking? Authorizing Provider  apixaban (ELIQUIS) 5 MG TABS tablet Take 1 tablet (5 mg total) by mouth 2 (two) times daily. 06/26/19  Yes Hall, Carole N, DO  atorvastatin (LIPITOR) 40 MG tablet Take 1 tablet (40 mg total) by mouth daily. 06/26/19  Yes Hall, Carole N, DO  diltiazem (CARTIA XT) 180 MG 24 hr capsule Take 180 mg by mouth daily.   Yes [provider]  DULoxetine (CYMBALTA) 60 MG capsule Take 60 mg by mouth daily.    Yes [provider]  hydrALAZINE (APRESOLINE) 10 MG tablet Take 1 tablet (10 mg total) by mouth 2 (two) times daily. Patient taking differently: Take 10 mg by mouth 2 (two) times daily. As needed for pain 06/26/19 07/24/20 Yes Darlin DropHall, Carole N, DO  HYDROcodone-acetaminophen (NORCO/VICODIN) 5-325 MG tablet Take 1 tablet by mouth every 12 (twelve) hours as needed for moderate pain or severe pain.  06/19/20  Yes [provider]  isosorbide mononitrate (IMDUR) 30 MG 24 hr tablet TAKE 1 TABLET(30 MG) BY MOUTH DAILY 10/15/19  Yes Nahser, Deloris PingPhilip J, MD  LORazepam (ATIVAN) 0.5 MG tablet Take 0.5 mg by mouth 3 (three) times daily as needed. 07/16/19  Yes  [provider]  metoprolol tartrate (LOPRESSOR) 50 MG tablet Take 50 mg by mouth 2 (two) times daily.   Yes [provider]  polyethylene glycol (MIRALAX / GLYCOLAX) 17 g packet Take 17 g by mouth daily. 06/26/19  Yes Darlin Drop, DO  Potassium Chloride ER 20 MEQ TBCR Take 1 tablet by mouth daily. 12/10/19  Yes [provider]  torsemide (DEMADEX) 20 MG tablet TAKE ONE TABLET BY MOUTH ONCE daily 07/17/20  Yes Nahser, Deloris Ping, MD  zolpidem (AMBIEN) 10 MG tablet Take 10 mg by mouth at bedtime.   Yes [provider]  azithromycin (ZITHROMAX) 250 MG tablet Zpack taper as directed Patient not taking: Reported on 07/24/2020 07/11/20   Glenford Bayley, NP  predniSONE  (DELTASONE) 10 MG tablet Take 1 tablet (10 mg total) by mouth in the morning and at bedtime. 07/23/20   Tomma Lightning, MD    Physical Exam: BP 133/65   Pulse 62   Temp 98.4 F (36.9 C) (Oral)   Resp 19   Ht 5\' 7"  (1.702 m)   Wt 80.1 kg   SpO2 98%   BMI 27.66 kg/m   . General: 86 y.o. year-old elderly female in no acute distress.  Alert and oriented x3. 95 HEENT: NCAT, EOMI . Neck: Supple, trachea media . Cardiovascular: Irregularly irregular rate and rhythm with no rubs or gallops.  No thyromegaly or JVD noted.  No lower extremity edema. 2/4 pulses in all 4 extremities. Marland Kitchen Respiratory: Tachypnea.  Decreased breath sounds warms in lower lobes.   . Abdomen: Soft nontender nondistended with normal bowel sounds x4 quadrants. . Muskuloskeletal: No cyanosis, clubbing or edema noted bilaterally . Neuro: CN II-XII intact, strength, sensation, reflexes . Skin: No ulcerative lesions noted or rashes . Psychiatry: Judgement and insight appear normal. Mood is appropriate for condition and setting          Labs on Admission:  Basic Metabolic Panel: Recent Labs  Lab 07/24/20 1507  NA 133*  K 4.4  CL 94*  CO2 32  GLUCOSE 129*  BUN 12  CREATININE 0.90  CALCIUM 10.0   Liver Function Tests: No results for input(s): AST, ALT, ALKPHOS, BILITOT, PROT, ALBUMIN in the last 168 hours. No results for input(s): LIPASE, AMYLASE in the last 168 hours. No results for input(s): AMMONIA in the last 168 hours. CBC: Recent Labs  Lab 07/24/20 1507  WBC 12.0*  NEUTROABS 9.5*  HGB 12.9  HCT 40.6  MCV 95.3  PLT 220   Cardiac Enzymes: No results for input(s): CKTOTAL, CKMB, CKMBINDEX, TROPONINI in the last 168 hours.  BNP (last 3 results) Recent Labs    07/24/20 1507  BNP 429.0*    ProBNP (last 3 results) Recent Labs    03/04/20 1626 07/10/20 1204  PROBNP 1,661* 446.0*    CBG: No results for input(s): GLUCAP in the last 168 hours.  Radiological Exams on Admission: DG Chest  Portable 1 View  Result Date: 07/24/2020 CLINICAL DATA:  Shortness of breath. EXAM: PORTABLE CHEST 1 VIEW COMPARISON:  July 10, 2020. FINDINGS: Mildly enlarged cardiac silhouette. Aortic atherosclerosis. Pulmonary vascular congestion. Mild diffuse interstitial prominence. Left basilar opacities. Interval increase in a small to moderate right pleural effusion with fluid layering along the major fissure. No visible pneumothorax. IMPRESSION: 1. Suspected congestive heart failure with mild cardiomegaly, pulmonary vascular congestion and likely mild interstitial edema. Increased small to moderate right pleural effusion. 2. Left basilar opacities, which could represent alveolar edema, atelectasis, aspiration,  or pneumonia. Electronically Signed   By: Feliberto Harts MD   On: 07/24/2020 15:48    EKG: I independently viewed the EKG done and my findings are as followed: A. fib with rate control and LBBB.  Assessment/Plan Present on Admission: . HTN (hypertension) . Hyperlipidemia with target LDL less than 70 . Atrial fibrillation (HCC) . OSA (obstructive sleep apnea) . Nocturnal hypoxia  Principal Problem:   Acute exacerbation of CHF (congestive heart failure) (HCC) Active Problems:   HTN (hypertension)   Hyperlipidemia with target LDL less than 70   Atrial fibrillation (HCC)   OSA (obstructive sleep apnea)   Nocturnal hypoxia   Constipation   Leukocytosis   Acute exacerbation of CHF  Chest x-ray showed Suspected congestive heart failure with mild cardiomegaly, pulmonary vascular congestion and likely mild interstitial edema. Increased small to moderate right pleural effusion. BNP 429 Patient was started on IV Lasix 40 Mg x1 Continue total input/output, daily weights and fluid restriction Continue IV Lasix 40 twice daily Continue Cardiac diet  Echocardiogram done on 06/20/2019 showed LVEF of 55 to 60%. Echocardiogram will be repeated in the morning   Questionable CAP POA Patient was  empirically started on IV antibiotics in the ED due to elevated WBC at 12.0, however, patient denies fever, cough, chest congestion. We shall empirically continue with IV ceftriaxone and azithromycin with plan to de-escalate based on procalcitonin, sputum culture, blood culture, urine Legionella and strep pneumo  Leukocytosis possibly reactive vs infectious Continue management as per above  OSA with nocturnal hypoxemia Continue supplemental oxygen via [place] to maintain O2 sat > 92% Continue prednisone per home regimen  Atrial fibrillation with rate control  CHA2DS2-VASc score= 5 points which is equivalent to 7.2% stroke risk per year EKG shows A. fib with rate control Continue diltiazem, Lopressor and Eliquis  Essential hypertension Continue Lopressor, diltiazem  Hyperlipidemia Continue Lipitor  Constipation Continue MiraLAX   DVT prophylaxis: Eliquis  Code Status: Full code  Family Communication: None at bedside  Disposition Plan:  Patient is from:                        home Anticipated DC to:                   SNF or family members home Anticipated DC date:               2-3 days Anticipated DC barriers:          Patient is unable to be discharged at this time due to CHF exacerbation requiring inpatient management   Consults called: None  Admission status: Inpatient    Frankey Shown MD [facility]  07/24/2020, 8:42 PM

## 2020-07-24 NOTE — ED Provider Notes (Signed)
[facility] EMERGENCY DEPARTMENT Provider Note   CSN: [id number] Arrival date & time: 07/24/20  1435     History Chief Complaint  Patient presents with  . Shortness of Breath    Carrie Best is a 86 y.o. female.  HPI     This is a 86 year old female with a history of congestive heart failure, reflux, hypertension, hyperlipidemia, COPD on home oxygen who presents with shortness of breath.  Patient reports 3-day history worsening shortness of breath.  She feels that her shortness of breath is worse at night but not necessarily when she lays flat.  She has not noted any leg swelling.  She denies any coughs or fevers.  She has been on her home oxygen requirement.  She reports difficulty catching her breath.  Denies any known Covid contacts or sick exposures.  She is vaccinated against COVID-19.  Denies chest pain.  Past Medical History:  Diagnosis Date  . Anxiety   . Arthritis   . Congestive heart failure (HCC)    Congestive heart failure symptoms  . Depression   . Diastolic dysfunction   . Dyspnea    occasionally, with exertion  . Dysrhythmia 05-10-12   hx. A. Fib., rate is controlled-tx. Xarelto  . GERD (gastroesophageal reflux disease) 05-10-12   tx. with meds as needed  . Hearing loss 05-10-12   bilateral hearing aids.  . Heart murmur   . History of kidney stones   . Hyperlipidemia   . Hypertension   . Pneumonia 05-10-12   dx. in July- tx. antibiotics  . Stroke (HCC) 05-10-12   Lt.CVA note on scans-hx. freq. falls/ none past 6 months 01/21/14  . Wears hearing aid    bilateral    Patient Active Problem List   Diagnosis Date Noted  . OSA (obstructive sleep apnea) 07/10/2020  . Nocturnal hypoxia 07/10/2020  . Acute confusion 07/10/2020  . CVA (cerebral vascular accident) (HCC) 06/19/2019  . Acute encephalopathy 06/18/2019  . Acute respiratory failure with hypoxia (HCC) 09/10/2018  . Chronic kidney disease (CKD), stage II (mild) 09/10/2018  . Amyloidosis of bladder  (HCC) 09/10/2018  . CHF exacerbation (HCC) 09/09/2018  . ARF (acute renal failure) (HCC) 07/10/2018  . Nausea & vomiting 07/09/2018  . Hypokalemia 12/25/2017  . Acute on chronic diastolic CHF (congestive heart failure) (HCC) 12/24/2017  . 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 Rupture of right patellar tendon 05/19/2016  . H/O nonunion of fracture 03/16/2016  . Arthritis of knee 02/17/2015  . Primary osteoarthritis of right knee Valgus 02/14/2015  . Chronic diastolic CHF (congestive heart failure) (HCC) 02/06/2015  . Proximal humerus fracture 06/10/2014  . Obesity 03/18/2012  . HTN (hypertension) 03/18/2012  . GERD (gastroesophageal reflux disease) 03/18/2012  . Hyperlipidemia with target LDL less than 70 03/18/2012  . Sleep disturbance 03/18/2012  . Atrial fibrillation (HCC) 03/18/2012    Past Surgical History:  Procedure Laterality Date  . APPENDECTOMY    . CATARACT EXTRACTION, BILATERAL  05-10-12   bilateral  . CYSTOSCOPY WITH RETROGRADE PYELOGRAM, URETEROSCOPY AND STENT PLACEMENT Right 01/31/2014   Procedure: CYSTOSCOPY WITH bilateral  RETROGRADE PYELOGRAM, right URETEROSCOPY AND right  STENT PLACEMENT, bladder biopsy with fulgeration;  Surgeon: Sebastian Ache, MD;  Location: [facility];  Service: Urology;  Laterality: Right;  . HARDWARE REMOVAL Right 09/09/2014   Procedure: HARDWARE REMOVAL RIGHT SHOULDER;  Surgeon: Mable Paris, MD;  Location: [facility];  Service: Orthopedics;  Laterality: Right;  Right shoulder hardware removal  .  HOLMIUM LASER APPLICATION Right 01/31/2014   Procedure: HOLMIUM LASER APPLICATION;  Surgeon: Sebastian Acheheodore Manny, MD;  Location: [facility];  Service: Urology;  Laterality: Right;  . HUMERUS IM NAIL Right 06/10/2014   Procedure: INTRAMEDULLARY (IM) NAIL HUMERAL;  Surgeon: Mable ParisJustin William Chandler, MD;  Location: [facility];  Service: Orthopedics;  Laterality: Right;  Right intramedullary humeral nail  . INTERSTIM IMPLANT PLACEMENT   05-10-12   now malfunctioning  . INTERSTIM IMPLANT REMOVAL  05/16/2012   Procedure: REMOVAL OF INTERSTIM IMPLANT;  Surgeon: Martina SinnerScott A MacDiarmid, MD;  Location: [facility];  Service: Urology;  Laterality: N/A;  . JOINT REPLACEMENT    . OTHER SURGICAL HISTORY     Bladder Surgery  . SHOULDER SURGERY    . TOTAL KNEE ARTHROPLASTY Right 02/17/2015   Procedure: TOTAL KNEE ARTHROPLASTY;  Surgeon: Gean BirchwoodFrank Rowan, MD;  Location: [facility];  Service: Orthopedics;  Laterality: Right;     OB History   No obstetric history on file.     Family History  Problem Relation Age of Onset  . Stroke Mother   . Stroke Brother     Social History   Tobacco Use  . Smoking status: Never Smoker  . Smokeless tobacco: Never Used  Vaping Use  . Vaping Use: Never used  Substance Use Topics  . Alcohol use: No  . Drug use: No    Home Medications Prior to Admission medications   Medication Sig Start Date End Date Taking? Authorizing Provider  apixaban (ELIQUIS) 5 MG TABS tablet Take 1 tablet (5 mg total) by mouth 2 (two) times daily. 06/26/19  Yes Hall, Carole N, DO  atorvastatin (LIPITOR) 40 MG tablet Take 1 tablet (40 mg total) by mouth daily. 06/26/19  Yes Hall, Carole N, DO  diltiazem (CARTIA XT) 180 MG 24 hr capsule Take 180 mg by mouth daily.   Yes [provider]  DULoxetine (CYMBALTA) 60 MG capsule Take 60 mg by mouth daily.    Yes [provider]  hydrALAZINE (APRESOLINE) 10 MG tablet Take 1 tablet (10 mg total) by mouth 2 (two) times daily. Patient taking differently: Take 10 mg by mouth 2 (two) times daily. As needed for pain 06/26/19 07/24/20 Yes Darlin DropHall, Carole N, DO  HYDROcodone-acetaminophen (NORCO/VICODIN) 5-325 MG tablet Take 1 tablet by mouth every 12 (twelve) hours as needed for moderate pain or severe pain.  06/19/20  Yes [provider]  isosorbide mononitrate (IMDUR) 30 MG 24 hr tablet TAKE 1 TABLET(30 MG) BY MOUTH DAILY 10/15/19  Yes Nahser, Deloris PingPhilip J, MD  LORazepam (ATIVAN) 0.5  MG tablet Take 0.5 mg by mouth 3 (three) times daily as needed. 07/16/19  Yes [provider]  metoprolol tartrate (LOPRESSOR) 50 MG tablet Take 50 mg by mouth 2 (two) times daily.   Yes [provider]  polyethylene glycol (MIRALAX / GLYCOLAX) 17 g packet Take 17 g by mouth daily. 06/26/19  Yes Darlin DropHall, Carole N, DO  Potassium Chloride ER 20 MEQ TBCR Take 1 tablet by mouth daily. 12/10/19  Yes [provider]  torsemide (DEMADEX) 20 MG tablet TAKE ONE TABLET BY MOUTH ONCE daily 07/17/20  Yes Nahser, Deloris PingPhilip J, MD  zolpidem (AMBIEN) 10 MG tablet Take 10 mg by mouth at bedtime.   Yes [provider]  azithromycin (ZITHROMAX) 250 MG tablet Zpack taper as directed Patient not taking: Reported on 07/24/2020 07/11/20   Glenford BayleyWalsh, Elizabeth W, NP  predniSONE (DELTASONE) 10 MG tablet Take 1 tablet (10 mg total) by mouth in the  morning and at bedtime. 07/23/20   Tomma Lightning, MD    Allergies    Codeine, Penicillins, and Sulfur  Review of Systems   Review of Systems  Constitutional: Negative for fever.  Respiratory: Positive for shortness of breath. Negative for cough and wheezing.   Cardiovascular: Negative for chest pain and leg swelling.  Gastrointestinal: Negative for abdominal pain, nausea and vomiting.  Genitourinary: Negative for dysuria.  All other systems reviewed and are negative.   Physical Exam Updated Vital Signs BP 133/65   Pulse 62   Temp 98.4 F (36.9 C) (Oral)   Resp 19   SpO2 98%   Physical Exam Vitals and nursing note reviewed.  Constitutional:      Comments: Elderly, nontoxic-appearing  HENT:     Head: Normocephalic and atraumatic.  Eyes:     Pupils: Pupils are equal, round, and reactive to light.  Cardiovascular:     Rate and Rhythm: Normal rate. Rhythm irregular.     Heart sounds: Normal heart sounds.  Pulmonary:     Effort: Pulmonary effort is normal. No respiratory distress.     Breath sounds: No wheezing.     Comments:  Diminished breath sounds in all lung fields, tachypnea noted with slight splinting Abdominal:     General: Bowel sounds are normal.     Palpations: Abdomen is soft.  Musculoskeletal:     Cervical back: Neck supple.     Right lower leg: No edema.     Left lower leg: No edema.  Skin:    General: Skin is warm and dry.  Neurological:     Mental Status: She is alert and oriented to person, place, and time.  Psychiatric:        Mood and Affect: Mood normal.     ED Results / Procedures / Treatments   Labs (all labs ordered are listed, but only abnormal results are displayed) Labs Reviewed  CBC WITH DIFFERENTIAL/PLATELET - Abnormal; Notable for the following components:      Result Value   WBC 12.0 (*)    Neutro Abs 9.5 (*)    All other components within normal limits  BASIC METABOLIC PANEL - Abnormal; Notable for the following components:   Sodium 133 (*)    Chloride 94 (*)    Glucose, Bld 129 (*)    All other components within normal limits  BRAIN NATRIURETIC PEPTIDE - Abnormal; Notable for the following components:   B Natriuretic Peptide 429.0 (*)    All other components within normal limits  RESP PANEL BY RT-PCR (FLU A&B, COVID) ARPGX2  TROPONIN I (HIGH SENSITIVITY)  TROPONIN I (HIGH SENSITIVITY)    EKG EKG Interpretation  Date/Time:  Thursday July 24 2020 14:46:48 EST Ventricular Rate:  64 PR Interval:    QRS Duration: 143 QT Interval:  429 QTC Calculation: 443 R Axis:   -63 Text Interpretation: Atrial fibrillation Left bundle branch block Baseline wander in lead(s) I III aVL Confirmed by Ross Marcus ([id number]) on 07/24/2020 3:22:20 PM   Radiology DG Chest Portable 1 View  Result Date: 07/24/2020 CLINICAL DATA:  Shortness of breath. EXAM: PORTABLE CHEST 1 VIEW COMPARISON:  July 10, 2020. FINDINGS: Mildly enlarged cardiac silhouette. Aortic atherosclerosis. Pulmonary vascular congestion. Mild diffuse interstitial prominence. Left basilar opacities.  Interval increase in a small to moderate right pleural effusion with fluid layering along the major fissure. No visible pneumothorax. IMPRESSION: 1. Suspected congestive heart failure with mild cardiomegaly, pulmonary vascular congestion and likely mild interstitial edema. Increased  small to moderate right pleural effusion. 2. Left basilar opacities, which could represent alveolar edema, atelectasis, aspiration, or pneumonia. Electronically Signed   By: Feliberto Harts MD   On: 07/24/2020 15:48    Procedures Procedures (including critical care time)  CRITICAL CARE Performed by: Shon Baton   Total critical care time: 40 minutes  Critical care time was exclusive of separately billable procedures and treating other patients.  Critical care was necessary to treat or prevent imminent or life-threatening deterioration.  Critical care was time spent personally by me on the following activities: development of treatment plan with patient and/or surrogate as well as nursing, discussions with consultants, evaluation of patient's response to treatment, examination of patient, obtaining history from patient or surrogate, ordering and performing treatments and interventions, ordering and review of laboratory studies, ordering and review of radiographic studies, pulse oximetry and re-evaluation of patient's condition.   Medications Ordered in ED Medications  cefTRIAXone (ROCEPHIN) 1 g in sodium chloride 0.9 % 100 mL IVPB (has no administration in time range)  azithromycin (ZITHROMAX) 500 mg in sodium chloride 0.9 % 250 mL IVPB (has no administration in time range)  albuterol (VENTOLIN HFA) 108 (90 Base) MCG/ACT inhaler 6 puff (6 puffs Inhalation Given 07/24/20 1647)  furosemide (LASIX) injection 40 mg (40 mg Intravenous Given 07/24/20 1647)    ED Course  I have reviewed the triage vital signs and the nursing notes.  Pertinent labs & imaging results that were available during my care of the  patient were reviewed by me and considered in my medical decision making (see chart for details).  Clinical Course as of Jul 24 1954  Thu Jul 24, 2020  1937 Spoke to patient's daughter-in-law.  She gave the patient an additional dose of diuretic yesterday concerned for CHF.  While she does not have any new oxygen requirement, she is concerned about the fall risk with her diuresing overnight.  Will ask the hospitalist to evaluate for observation admission.   [CH]    Clinical Course User Index [CH] Ernestyne Caldwell, Mayer Masker, MD   MDM Rules/Calculators/A&P                           Patient presents with worsening shortness of breath.  Baseline has COPD and heart failure.  She is mildly tachypneic but on her home oxygen requirement.  She does not overtly appear volume overloaded.  Vital signs reviewed and are largely reassuring.  She is afebrile.  She is some coarse breath sounds and diminished breath sounds bilaterally but no wheezing.  Considerations include but not limited to, COPD, heart failure, pneumonia, Covid.  Work-up initiated.  EKG without acute ischemic changes.  She is in rate controlled atrial fibrillation.  Lab work-up notable for mild leukocytosis.  No significant metabolic derangements.  BNP is just slightly elevated from baseline.  Chest x-ray is concerning for interstitial edema.  She also has an opacity which could reflect edema versus pneumonia.  Clinically, more suspicious for edema; however, given leukocytosis, patient was given Rocephin and azithromycin.  She does not appear septic.  She was given 40 mg of IV Lasix.  Discussed with the daughter-in-law.  Feel it is best for patient to admit for observation for ongoing diuresis and close monitoring.  After history, exam, and medical workup I feel the patient has been appropriately medically screened and is safe for discharge home. Pertinent diagnoses were discussed with the patient. Patient was given return precautions.  Final Clinical  Impression(s) / ED Diagnoses Final diagnoses:  SOB (shortness of breath)  Acute on chronic congestive heart failure, unspecified heart failure type [facility])    Rx / DC Orders ED Discharge Orders    None       Wilkie Aye, Mayer Masker, MD 07/24/20 [id number]

## 2020-07-24 NOTE — ED Notes (Signed)
Phlebotomist to bedside to draw serum labs per order.

## 2020-07-24 NOTE — ED Notes (Signed)
Received care of patient resting in bed.  0 s/s acute distress.  Call bell in reach. 

## 2020-07-25 ENCOUNTER — Inpatient Hospital Stay (HOSPITAL_COMMUNITY): Payer: Medicare Other

## 2020-07-25 DIAGNOSIS — I5033 Acute on chronic diastolic (congestive) heart failure: Secondary | ICD-10-CM

## 2020-07-25 LAB — PHOSPHORUS: Phosphorus: 3.3 mg/dL (ref 2.5–4.6)

## 2020-07-25 LAB — CBC
HCT: 38 % (ref 36.0–46.0)
Hemoglobin: 12.1 g/dL (ref 12.0–15.0)
MCH: 30 pg (ref 26.0–34.0)
MCHC: 31.8 g/dL (ref 30.0–36.0)
MCV: 94.3 fL (ref 80.0–100.0)
Platelets: 192 10*3/uL (ref 150–400)
RBC: 4.03 MIL/uL (ref 3.87–5.11)
RDW: 14.6 % (ref 11.5–15.5)
WBC: 8.2 10*3/uL (ref 4.0–10.5)
nRBC: 0 % (ref 0.0–0.2)

## 2020-07-25 LAB — COMPREHENSIVE METABOLIC PANEL
ALT: 11 U/L (ref 0–44)
AST: 17 U/L (ref 15–41)
Albumin: 3.3 g/dL — ABNORMAL LOW (ref 3.5–5.0)
Alkaline Phosphatase: 100 U/L (ref 38–126)
Anion gap: 10 (ref 5–15)
BUN: 14 mg/dL (ref 8–23)
CO2: 33 mmol/L — ABNORMAL HIGH (ref 22–32)
Calcium: 9.5 mg/dL (ref 8.9–10.3)
Chloride: 92 mmol/L — ABNORMAL LOW (ref 98–111)
Creatinine, Ser: 1 mg/dL (ref 0.44–1.00)
GFR, Estimated: 54 mL/min — ABNORMAL LOW (ref >60)
Glucose, Bld: 103 mg/dL — ABNORMAL HIGH (ref 70–99)
Potassium: 3.3 mmol/L — ABNORMAL LOW (ref 3.5–5.1)
Sodium: 135 mmol/L (ref 135–145)
Total Bilirubin: 1.2 mg/dL (ref 0.3–1.2)
Total Protein: 6.4 g/dL — ABNORMAL LOW (ref 6.5–8.1)

## 2020-07-25 LAB — ECHOCARDIOGRAM COMPLETE
AR max vel: 1.69 cm2
AV Area VTI: 1.73 cm2
AV Area mean vel: 1.69 cm2
AV Mean grad: 5 mmHg
AV Peak grad: 11.1 mmHg
Ao pk vel: 1.67 m/s
Area-P 1/2: 4.04 cm2
Height: 68 in
S' Lateral: 2.48 cm
Weight: 2811.31 oz

## 2020-07-25 LAB — MAGNESIUM: Magnesium: 2 mg/dL (ref 1.7–2.4)

## 2020-07-25 LAB — PROTIME-INR
INR: 1.8 — ABNORMAL HIGH (ref 0.8–1.2)
Prothrombin Time: 20.1 seconds — ABNORMAL HIGH (ref 11.4–15.2)

## 2020-07-25 LAB — APTT: aPTT: 40 seconds — ABNORMAL HIGH (ref 24–36)

## 2020-07-25 MED ORDER — POTASSIUM CHLORIDE CRYS ER 20 MEQ PO TBCR
40.0000 meq | EXTENDED_RELEASE_TABLET | ORAL | Status: AC
  Administered 2020-07-25 (×2): 40 meq via ORAL
  Filled 2020-07-25 (×2): qty 2

## 2020-07-25 MED ORDER — PERFLUTREN LIPID MICROSPHERE
1.0000 mL | INTRAVENOUS | Status: AC | PRN
  Administered 2020-07-25: 2 mL via INTRAVENOUS

## 2020-07-25 MED ORDER — LORAZEPAM 0.5 MG PO TABS
0.5000 mg | ORAL_TABLET | Freq: Three times a day (TID) | ORAL | Status: DC | PRN
  Administered 2020-07-25 – 2020-07-26 (×3): 0.5 mg via ORAL
  Filled 2020-07-25 (×3): qty 1

## 2020-07-25 MED ORDER — SODIUM CHLORIDE 0.9 % IV SOLN
1.0000 g | INTRAVENOUS | Status: DC
  Administered 2020-07-25 – 2020-07-27 (×3): 1 g via INTRAVENOUS
  Filled 2020-07-25 (×3): qty 10

## 2020-07-25 NOTE — Progress Notes (Signed)
Patient Demographics:    Carrie Best, is a 86 y.o. female, DOB - [birth year], VPX:[id number]  Admit date - 07/24/2020   Admitting Physician Carrie Shown, DO  Outpatient Primary MD for the patient is Carrie Funk, MD  LOS - 1   Chief Complaint  Patient presents with  . Shortness of Breath        Subjective:    Carrie Best today has no fevers, no emesis,  No chest pain,  -Dyspnea is not worse, daughter-in-law at bedside, questions answered -During that admission patient's O2 sats dropped to 86% on 3 L of oxygen via nasal cannula, oxygen had to be titrated up to 5 L to keep O2 sats around 90%  Assessment  & Plan :    Principal Problem:   Acute exacerbation of CHF (congestive heart failure) (HCC) Active Problems:   HTN (hypertension)   Hyperlipidemia with target LDL less than 70   Atrial fibrillation (HCC)   OSA (obstructive sleep apnea)   Nocturnal hypoxia   Constipation   Leukocytosis   Elevated brain natriuretic peptide (BNP) level   CAP (community acquired pneumonia)   Acute on chronic heart failure with preserved ejection fraction (HFpEF) (HCC)  Brief Summary:- 86 y.o. female with medical history significant for dCHF, HTN, HLD, COPD on supplemental oxygen at 3 LPM at home, afib on  Eliquis, constipation and OSA (not on CPAP) admitted on 07/24/2020 with worsening hypoxia and dyspnea and found to have CHF with bilateral right more than left pleural effusions  A/p 1) acute on chronic hypoxic respiratory failure--- patient was started on home O2 at 2 to 3 L couple weeks ago -During that admission patient's O2 sats dropped to 86% on 3 L of oxygen via nasal cannula, oxygen had to be titrated up to 5 L to keep O2 sats around 90% -Anticipate that hypoxia will improve with aggressive iv diuresis, otherwise patient will need right-sided thoracentesis  2) acute on chronic diastolic CHF  exacerbation--patient failed oral torsemide at home -Continue IV diuresis with IV Lasix, daily weights and fluid input and output monitoring  3) presumed community-acquired pneumonia--- continue Rocephin and azithromycin--repeat chest x-ray after diuresis to confirm possible underlying pneumonia - 4) chronic atrial fibrillation--- stable, continue Eliquis for stroke prophylaxis, continue metoprolol and Cardizem for rate control  5) generalized weakness/deconditioning/ambulatory dysfunction--- Patient requires frequent re-positioning of the body in ways that cannot be achieved with an ordinary bed or wedge pillow, to eliminate pain, reduce pressure, and the head of the bed to be elevated more than 30 degrees most of the time due to--congestive heart failure with persistent chronic hypoxic respiratory failure --- Patient suffers from chronic hypoxic respiratory failure secondary to chronic diastolic congestive heart failure, patient also suffers from ambulatory dysfunction   which impairs their ability to perform daily activities like ambulating in the home. A walker will not resolve issue with performing activities of daily living.  -Lift chair/recliner will be more appropriate to help patient perform activities of daily living including washing/bathing , brushing her teeth and grooming  Disposition/Need for in-Hospital Stay- patient unable to be discharged at this time due to --patient failed outpatient oral diuretics requires IV diuretics due to dCHF exacerbation, also need IV antibiotics for presumed pneumonia may need  right-sided thoracentesis  Status is: Inpatient  Remains inpatient appropriate because:patient failed outpatient oral diuretics requires IV diuretics due to dCHF exacerbation, also need IV antibiotics for presumed pneumonia may need right-sided thoracentesis   Disposition: The patient is from: Home              Anticipated d/c is to: Home              Anticipated d/c date is: 2  days              Patient currently is not medically stable to d/c. Barriers: Not Clinically Stable- patient failed outpatient oral diuretics requires IV diuretics due to dCHF exacerbation, also need IV antibiotics for presumed pneumonia may need right-sided thoracentesis  Code Status : Full  Family Communication:  (patient is alert, awake and coherent)  --Discussed with daughter-in-law at bedside  Consults  :  na  DVT Prophylaxis  : Apixaban- SCDs   Lab Results  Component Value Date   PLT 192 07/25/2020    Inpatient Medications  Scheduled Meds: . apixaban  5 mg Oral BID  . atorvastatin  40 mg Oral Daily  . diltiazem  180 mg Oral Daily  . furosemide  40 mg Intravenous Q12H  . metoprolol tartrate  50 mg Oral BID  . polyethylene glycol  17 g Oral Daily  . potassium chloride  40 mEq Oral Q3H  . predniSONE  10 mg Oral BID WC   Continuous Infusions: . azithromycin 500 mg (07/24/20 2055)  . cefTRIAXone (ROCEPHIN)  IV 1 g (07/25/20 1104)   PRN Meds:.    Anti-infectives (From admission, onward)   Start     Dose/Rate Route Frequency Ordered Stop   07/25/20 1015  cefTRIAXone (ROCEPHIN) 1 g in sodium chloride 0.9 % 100 mL IVPB        1 g 200 mL/hr over 30 Minutes Intravenous Every 24 hours 07/25/20 0918     07/24/20 1945  cefTRIAXone (ROCEPHIN) 1 g in sodium chloride 0.9 % 100 mL IVPB        1 g 200 mL/hr over 30 Minutes Intravenous  Once 07/24/20 1937 07/24/20 2056   07/24/20 1945  azithromycin (ZITHROMAX) 500 mg in sodium chloride 0.9 % 250 mL IVPB        500 mg 250 mL/hr over 60 Minutes Intravenous Every 24 hours 07/24/20 1937          Objective:   Vitals:   07/25/20 0136 07/25/20 0510 07/25/20 1007 07/25/20 1100  BP: (!) 153/65 (!) 145/68 (!) 131/50   Pulse: 69 73 66 75  Resp: 20 20 19    Temp: 97.7 F (36.5 C) 98.1 F (36.7 C) 97.8 F (36.6 C)   TempSrc:      SpO2: 99% 96% 97%   Weight:      Height:        Wt Readings from Last 3 Encounters:  07/24/20  79.7 kg  07/10/20 80.1 kg  04/25/20 81.6 kg     Intake/Output Summary (Last 24 hours) at 07/25/2020 1350 Last data filed at 07/25/2020 0900 Gross per 24 hour  Intake 499.06 ml  Output 100 ml  Net 399.06 ml     Physical Exam  Gen:- Awake Alert,  In no apparent distress  HEENT:- Carrie Best, No sclera icterus Nose- Carrie Best 4L/min Neck-Supple Neck,No JVD,.  Lungs-diminished breath sounds especially on the right, scattered rales  CV- S1, S2 normal, regular  Abd-  +ve B.Sounds, Abd Soft, No tenderness,  Extremity/Skin:-2+ pitting edema, pedal pulses present  Psych-affect is appropriate, oriented x3 Neuro-generalized weakness, ambulatory dysfunction, chronic neuro-muscular deficits, no additional new focal deficits, no tremors   Data Review:   Micro Results Recent Results (from the past 240 hour(s))  Resp Panel by RT-PCR (Flu A&B, Covid) Nasopharyngeal Swab     Status: None   Collection Time: 07/24/20  2:59 PM   Specimen: Nasopharyngeal Swab; Nasopharyngeal(NP) swabs in vial transport medium  Result Value Ref Range Status   SARS Coronavirus 2 by RT PCR NEGATIVE NEGATIVE Final    Comment: (NOTE) SARS-CoV-2 target nucleic acids are NOT DETECTED.  The SARS-CoV-2 RNA is generally detectable in upper respiratory specimens during the acute phase of infection. The lowest concentration of SARS-CoV-2 viral copies this assay can detect is 138 copies/mL. A negative result does not preclude SARS-Cov-2 infection and should not be used as the sole basis for treatment or other patient management decisions. A negative result may occur with  improper specimen collection/handling, submission of specimen other than nasopharyngeal swab, presence of viral mutation(s) within the areas targeted by this assay, and inadequate number of viral copies(<138 copies/mL). A negative result must be combined with clinical observations, patient history, and epidemiological information. The expected result is  Negative.  Fact Sheet for Patients:  BloggerCourse.com  Fact Sheet for Healthcare Providers:  SeriousBroker.it  This test is no t yet approved or cleared by the [place] FDA and  has been authorized for detection and/or diagnosis of SARS-CoV-2 by FDA under an Emergency Use Authorization (EUA). This EUA will remain  in effect (meaning this test can be used) for the duration of the COVID-19 declaration under Section 564(b)(1) of the Act, 21 U.S.C.section 360bbb-3(b)(1), unless the authorization is terminated  or revoked sooner.       Influenza A by PCR NEGATIVE NEGATIVE Final   Influenza B by PCR NEGATIVE NEGATIVE Final    Comment: (NOTE) The Xpert Xpress SARS-CoV-2/FLU/RSV plus assay is intended as an aid in the diagnosis of influenza from Nasopharyngeal swab specimens and should not be used as a sole basis for treatment. Nasal washings and aspirates are unacceptable for Xpert Xpress SARS-CoV-2/FLU/RSV testing.  Fact Sheet for Patients: BloggerCourse.com  Fact Sheet for Healthcare Providers: SeriousBroker.it  This test is not yet approved or cleared by the [place] FDA and has been authorized for detection and/or diagnosis of SARS-CoV-2 by FDA under an Emergency Use Authorization (EUA). This EUA will remain in effect (meaning this test can be used) for the duration of the COVID-19 declaration under Section 564(b)(1) of the Act, 21 U.S.C. section 360bbb-3(b)(1), unless the authorization is terminated or revoked.  Performed at [facility], [address] [place], [place] [address]   Culture, blood (Routine X 2) w Reflex to ID Panel     Status: None (Preliminary result)   Collection Time: 07/24/20  9:23 PM   Specimen: BLOOD LEFT WRIST  Result Value Ref Range Status   Specimen Description BLOOD LEFT WRIST  Final   Special Requests   Final    BOTTLES DRAWN AEROBIC  AND ANAEROBIC Blood Culture results may not be optimal due to an inadequate volume of blood received in culture bottles   Culture   Final    NO GROWTH < 12 HOURS Performed at [facility], [address], [place], [place] [address]    Report Status PENDING  Incomplete  Culture, blood (Routine X 2) w Reflex to ID Panel     Status: None (Preliminary result)  Collection Time: 07/24/20  9:29 PM   Specimen: BLOOD LEFT HAND  Result Value Ref Range Status   Specimen Description BLOOD LEFT HAND  Final   Special Requests   Final    BOTTLES DRAWN AEROBIC ONLY Blood Culture adequate volume   Culture   Final    NO GROWTH < 12 HOURS Performed at [facility] [address], [place], [place] [address]    Report Status PENDING  Incomplete    Radiology Reports DG Chest 2 View  Result Date: 07/10/2020 CLINICAL DATA:  COPD. EXAM: CHEST - 2 VIEW COMPARISON:  06/18/2019. FINDINGS: Mediastinum and hilar structures normal. Heart size stable. Diffuse bilateral prominent interstitial infiltrates and or edema noted. Small bilateral pleural effusions. No pneumothorax. Postsurgical changes right humerus. IMPRESSION: Diffuse bilateral prominent interstitial infiltrates and or edema. Pneumonitis and or CHF could present this fashion. Small bilateral pleural effusions. Electronically Signed   By: Maisie Fus  Register   On: 07/10/2020 12:27   DG Chest Portable 1 View  Result Date: 07/24/2020 CLINICAL DATA:  Shortness of breath. EXAM: PORTABLE CHEST 1 VIEW COMPARISON:  July 10, 2020. FINDINGS: Mildly enlarged cardiac silhouette. Aortic atherosclerosis. Pulmonary vascular congestion. Mild diffuse interstitial prominence. Left basilar opacities. Interval increase in a small to moderate right pleural effusion with fluid layering along the major fissure. No visible pneumothorax. IMPRESSION: 1. Suspected congestive heart failure with mild cardiomegaly, pulmonary vascular congestion and likely mild interstitial edema.  Increased small to moderate right pleural effusion. 2. Left basilar opacities, which could represent alveolar edema, atelectasis, aspiration, or pneumonia. Electronically Signed   By: Feliberto Harts MD   On: 07/24/2020 15:48   ECHOCARDIOGRAM COMPLETE  Result Date: 07/25/2020    ECHOCARDIOGRAM REPORT   Patient Name:   Carrie Best Date of Exam: 07/25/2020 Medical Rec #:  [id number]        Height:       68.0 in Accession #:    [id number]       Weight:       175.7 lb Date of Birth:  10-30-1927        BSA:          1.934 m Patient Age:    89 years         BP:           145/68 mmHg Patient Gender: F                HR:           73 bpm. [facility] Procedure: 2D Echo, Cardiac Doppler, Color Doppler and Intracardiac            Opacification Agent Indications:    CHF  History:        Patient has prior history of Echocardiogram examinations, most                 recent 06/20/2019. CHF, COPD and Stroke, Arrythmias:Atrial                 Fibrillation, Signs/Symptoms:Shortness of Breath; Risk                 Factors:Hypertension. Pleural effusion.  Sonographer:    Lavenia Atlas RDCS Referring Phys: [id number] OLADAPO ADEFESO IMPRESSIONS  1. Left ventricular ejection fraction, by estimation, is 70 to 75%. The left ventricle has hyperdynamic function. The left ventricle has no regional wall motion abnormalities. There is mild left ventricular hypertrophy. Left ventricular diastolic parameters are indeterminate. Elevated left atrial  pressure.  2. Right ventricular systolic function is normal. The right ventricular size is normal.  3. Left atrial size was moderately dilated.  4. Right atrial size was moderately dilated.  5. The mitral valve is normal in structure. No evidence of mitral valve regurgitation. No evidence of mitral stenosis.  6. The aortic valve is tricuspid. There is mild calcification of the aortic valve. There is mild thickening of the aortic valve. Aortic valve regurgitation is not  visualized. No aortic stenosis is present. FINDINGS  Left Ventricle: Left ventricular ejection fraction, by estimation, is 70 to 75%. The left ventricle has hyperdynamic function. The left ventricle has no regional wall motion abnormalities. The left ventricular internal cavity size was normal in size. There is mild left ventricular hypertrophy. Left ventricular diastolic parameters are indeterminate. Elevated left atrial pressure. Right Ventricle: The right ventricular size is normal. No increase in right ventricular wall thickness. Right ventricular systolic function is normal. Left Atrium: Left atrial size was moderately dilated. Right Atrium: Right atrial size was moderately dilated. Pericardium: There is no evidence of pericardial effusion. Mitral Valve: The mitral valve is normal in structure. There is mild thickening of the mitral valve leaflet(s). There is mild calcification of the mitral valve leaflet(s). Mild mitral annular calcification. No evidence of mitral valve regurgitation. No evidence of mitral valve stenosis. Tricuspid Valve: The tricuspid valve is normal in structure. Tricuspid valve regurgitation is not demonstrated. No evidence of tricuspid stenosis. Aortic Valve: The aortic valve is tricuspid. There is mild calcification of the aortic valve. There is mild thickening of the aortic valve. There is mild aortic valve annular calcification. Aortic valve regurgitation is not visualized. No aortic stenosis  is present. Aortic valve mean gradient measures 5.0 mmHg. Aortic valve peak gradient measures 11.1 mmHg. Aortic valve area, by VTI measures 1.73 cm. Pulmonic Valve: The pulmonic valve was not well visualized. Pulmonic valve regurgitation is not visualized. No evidence of pulmonic stenosis. Aorta: The aortic root is normal in size and structure. Pulmonary Artery: Indeterminant PASP, inadequate TR jet. IAS/Shunts: No atrial level shunt detected by color flow Doppler.  LEFT VENTRICLE PLAX 2D LVIDd:          4.30 cm  Diastology LVIDs:         2.48 cm  LV e' medial:   3.92 cm/s LV PW:         1.12 cm  LV E/e' medial: 20.3 LV IVS:        1.07 cm LVOT diam:     2.10 cm LV SV:         52 LV SV Index:   27 LVOT Area:     3.46 cm  RIGHT VENTRICLE RV Basal diam:  2.92 cm RV S prime:     8.49 cm/s TAPSE (M-mode): 1.9 cm LEFT ATRIUM             Index       RIGHT ATRIUM           Index LA diam:        3.70 cm 1.91 cm/m  RA Area:     21.60 cm LA Vol (A2C):   62.4 ml 32.26 ml/m RA Volume:   68.30 ml  35.31 ml/m LA Vol (A4C):   68.6 ml 35.47 ml/m LA Biplane Vol: 66.2 ml 34.22 ml/m  AORTIC VALVE AV Area (Vmax):    1.69 cm AV Area (Vmean):   1.69 cm AV Area (VTI):     1.73 cm AV Vmax:  166.96 cm/s AV Vmean:          100.510 cm/s AV VTI:            0.297 m AV Peak Grad:      11.1 mmHg AV Mean Grad:      5.0 mmHg LVOT Vmax:         81.41 cm/s LVOT Vmean:        49.131 cm/s LVOT VTI:          0.149 m LVOT/AV VTI ratio: 0.50  AORTA Ao Root diam: 2.60 cm MITRAL VALVE MV Area (PHT): 4.04 cm    SHUNTS MV Decel Time: 188 msec    Systemic VTI:  0.15 m MV E velocity: 79.70 cm/s  Systemic Diam: 2.10 cm MV A velocity: 26.10 cm/s MV E/A ratio:  3.05 MV A Prime:    6.6 cm/s Dina Rich MD Electronically signed by Dina Rich MD Signature Date/Time: 07/25/2020/11:33:13 AM    Final      CBC Recent Labs  Lab 07/24/20 1507 07/25/20 0602  WBC 12.0* 8.2  HGB 12.9 12.1  HCT 40.6 38.0  PLT 220 192  MCV 95.3 94.3  MCH 30.3 30.0  MCHC 31.8 31.8  RDW 14.7 14.6  LYMPHSABS 1.3  --   MONOABS 0.9  --   EOSABS 0.1  --   BASOSABS 0.1  --     Chemistries  Recent Labs  Lab 07/24/20 1507 07/25/20 0602  NA 133* 135  K 4.4 3.3*  CL 94* 92*  CO2 32 33*  GLUCOSE 129* 103*  BUN 12 14  CREATININE 0.90 1.00  CALCIUM 10.0 9.5  MG  --  2.0  AST  --  17  ALT  --  11  ALKPHOS  --  100  BILITOT  --  1.2    ------------------------------------------------------------------------------------------------------------------ No results for input(s): CHOL, HDL, LDLCALC, TRIG, CHOLHDL, LDLDIRECT in the last 72 hours.  Lab Results  Component Value Date   HGBA1C 5.9 (H) 06/20/2019   ------------------------------------------------------------------------------------------------------------------ No results for input(s): TSH, T4TOTAL, T3FREE, THYROIDAB in the last 72 hours.  Invalid input(s): FREET3 ------------------------------------------------------------------------------------------------------------------ No results for input(s): VITAMINB12, FOLATE, FERRITIN, TIBC, IRON, RETICCTPCT in the last 72 hours.  Coagulation profile Recent Labs  Lab 07/25/20 0602  INR 1.8*    No results for input(s): DDIMER in the last 72 hours.  Cardiac Enzymes No results for input(s): CKMB, TROPONINI, MYOGLOBIN in the last 168 hours.  Invalid input(s): CK ------------------------------------------------------------------------------------------------------------------    Component Value Date/Time   BNP 429.0 (H) 07/24/2020 1507   Shon Hale M.D on 07/25/2020 at 1:50 PM  Go to [email or web address] - for contact info  [facility] - Office  [phone]

## 2020-07-25 NOTE — Telephone Encounter (Signed)
Spoke with Daughter in [place] (dpr on file), states that they are getting the requested recliner ordered through a social worker since patient is currently admitted at [facility] for her breathing.  Nothing further needed at this time- will close encounter.

## 2020-07-25 NOTE — TOC Progression Note (Signed)
Transition of Care [facility]) - Progression Note    Patient Details  Name: Carrie Best MRN: [id number] Date of Birth: 1928/04/19  Transition of Care [facility]) CM/SW Contact  Barry Brunner, LCSW Phone Number: 07/25/2020, 2:44 PM  Clinical Narrative:    CSW contacted [facility] medical supply company for patient's chair lift referral. [employer] supply company reported that they required a medical necessity form, order, and prescription. CSW faxed following documents to the provided number [phone]. TOC to follow.   Expected Discharge Plan: Home w Home Health Services Barriers to Discharge: Continued Medical Work up  Expected Discharge Plan and Services Expected Discharge Plan: Home w Home Health Services In-house Referral: Clinical Social Work Discharge Planning Services: CM Consult Post Acute Care Choice: Home Health Living arrangements for the past 2 months: Single Family Home                                       Social Determinants of Health (SDOH) Interventions    Readmission Risk Interventions No flowsheet data found.

## 2020-07-25 NOTE — Progress Notes (Addendum)
  Patient requires frequent re-positioning of the body in ways that cannot be achieved with an ordinary bed or wedge pillow, to eliminate pain, reduce pressure, and the head of the bed to be elevated more than 30 degrees most of the time due to--congestive heart failure with persistent chronic hypoxic respiratory failure --- Patient suffers from chronic hypoxic respiratory failure secondary to chronic diastolic congestive heart failure, patient also suffers from ambulatory dysfunction   which impairs their ability to perform daily activities like ambulating in the home. A walker will not resolve issue with performing activities of daily living.  -Lift chair/recliner will be more appropriate to help patient perform activities of daily living including washing/bathing , brushing her teeth and grooming   Shon Hale, MD

## 2020-07-25 NOTE — Progress Notes (Signed)
  Echocardiogram 2D Echocardiogram has been performed.  Pieter Partridge 07/25/2020, 10:00 AM

## 2020-07-25 NOTE — Plan of Care (Signed)
?  Problem: Education: ?Goal: Ability to demonstrate management of disease process will improve ?Outcome: Progressing ?Goal: Ability to verbalize understanding of medication therapies will improve ?Outcome: Progressing ?  ?Problem: Education: ?Goal: Knowledge of General Education information will improve ?Description: Including pain rating scale, medication(s)/side effects and non-pharmacologic comfort measures ?Outcome: Progressing ?  ?Problem: Health Behavior/Discharge Planning: ?Goal: Ability to manage health-related needs will improve ?Outcome: Progressing ?  ?

## 2020-07-26 DIAGNOSIS — I5033 Acute on chronic diastolic (congestive) heart failure: Secondary | ICD-10-CM

## 2020-07-26 NOTE — Plan of Care (Signed)
  Problem: Education: Goal: Knowledge of General Education information will improve Description: Including pain rating scale, medication(s)/side effects and non-pharmacologic comfort measures Outcome: Progressing   Problem: Education: Goal: Ability to demonstrate management of disease process will improve Outcome: Progressing Goal: Ability to verbalize understanding of medication therapies will improve Outcome: Progressing

## 2020-07-26 NOTE — Plan of Care (Signed)
  Problem: Education: Goal: Ability to demonstrate management of disease process will improve Outcome: Progressing Goal: Ability to verbalize understanding of medication therapies will improve Outcome: Progressing   Problem: Activity: Goal: Capacity to carry out activities will improve Outcome: Progressing   

## 2020-07-26 NOTE — Progress Notes (Signed)
Patient Demographics:    Carrie Best, is a 86 y.o. female, DOB - 1927/10/19, WTU:[id number]  Admit date - 07/24/2020   Admitting Physician Frankey Shown, DO  Outpatient Primary MD for the patient is Kirby Funk, MD  LOS - 2   Chief Complaint  Patient presents with  . Shortness of Breath        Subjective:    Carrie Best today has no fevers, no emesis,  No chest pain,   =-Dyspnea is not worse -Urine output is adequate -Hypoxia is not worse -Denies productive cough  Assessment  & Plan :    Principal Problem:   Acute exacerbation of CHF (congestive heart failure) (HCC) Active Problems:   HTN (hypertension)   Hyperlipidemia with target LDL less than 70   Atrial fibrillation (HCC)   OSA (obstructive sleep apnea)   Nocturnal hypoxia   Constipation   Leukocytosis   Elevated brain natriuretic peptide (BNP) level   CAP (community acquired pneumonia)   Acute on chronic heart failure with preserved ejection fraction (HFpEF) (HCC)  Brief Summary:- 86 y.o. female with medical history significant for dCHF, HTN, HLD, COPD on supplemental oxygen at 3 LPM at home, afib on  Eliquis, constipation and OSA (not on CPAP) admitted on 07/24/2020 with worsening hypoxia and dyspnea and found to have CHF with bilateral right more than left pleural effusions  A/p 1) acute on chronic hypoxic respiratory failure--- patient was started on home O2 at 2 to 3 L couple weeks ago PTA -During this admission patient's O2 sats dropped to 86% on 3 L of oxygen via nasal cannula, oxygen had to be titrated up to 5 L to keep O2 sats around 90% -Hypoxia improving slowly,  patient may need right-sided thoracentesis  2) acute on chronic diastolic CHF exacerbation--patient failed oral torsemide at home -Continue IV diuresis with IV Lasix, daily weights and fluid input and output monitoring -Fluid balance negative over  2700 ml,  bed scale weight appears to be inaccurate  3) presumed community-acquired pneumonia--- continue Rocephin and azithromycin--repeat chest x-ray after diuresis to confirm possible underlying pneumonia - 4) chronic atrial fibrillation--- stable, continue Eliquis for stroke prophylaxis, continue metoprolol and Cardizem for rate control  5) generalized weakness/deconditioning/ambulatory dysfunction--- Patient requires frequent re-positioning of the body in ways that cannot be achieved with an ordinary bed or wedge pillow, to eliminate pain, reduce pressure, and the head of the bed to be elevated more than 30 degrees most of the time due to--congestive heart failure with persistent chronic hypoxic respiratory failure --- Patient suffers from chronic hypoxic respiratory failure secondary to chronic diastolic congestive heart failure, patient also suffers from ambulatory dysfunction   which impairs their ability to perform daily activities like ambulating in the home. A walker will not resolve issue with performing activities of daily living.  -Lift chair/recliner will be more appropriate to help patient perform activities of daily living including washing/bathing , brushing her teeth and grooming  Disposition/Need for in-Hospital Stay- patient unable to be discharged at this time due to --patient failed outpatient oral diuretics requires IV diuretics due to dCHF exacerbation, also need IV antibiotics for presumed pneumonia may need right-sided thoracentesis  Status is: Inpatient  Remains inpatient appropriate because:patient failed outpatient oral diuretics requires  IV diuretics due to dCHF exacerbation, also need IV antibiotics for presumed pneumonia may need right-sided thoracentesis   Disposition: The patient is from: Home              Anticipated d/c is to: Home              Anticipated d/c date is: 2 days              Patient currently is not medically stable to d/c. Barriers: Not  Clinically Stable- patient failed outpatient oral diuretics requires IV diuretics due to dCHF exacerbation, also need IV antibiotics for presumed pneumonia may need right-sided thoracentesis  Code Status : Full  Family Communication:  (patient is alert, awake and coherent)  --Previously Discussed with daughter-in-law   Consults  :  na  DVT Prophylaxis  : Apixaban- SCDs   Lab Results  Component Value Date   PLT 192 07/25/2020    Inpatient Medications  Scheduled Meds: . apixaban  5 mg Oral BID  . atorvastatin  40 mg Oral Daily  . diltiazem  180 mg Oral Daily  . furosemide  40 mg Intravenous Q12H  . metoprolol tartrate  50 mg Oral BID  . polyethylene glycol  17 g Oral Daily  . predniSONE  10 mg Oral BID WC   Continuous Infusions: . azithromycin 500 mg (07/25/20 2215)  . cefTRIAXone (ROCEPHIN)  IV 1 g (07/26/20 0907)   PRN Meds:.    Anti-infectives (From admission, onward)   Start     Dose/Rate Route Frequency Ordered Stop   07/25/20 1015  cefTRIAXone (ROCEPHIN) 1 g in sodium chloride 0.9 % 100 mL IVPB        1 g 200 mL/hr over 30 Minutes Intravenous Every 24 hours 07/25/20 0918     07/24/20 1945  cefTRIAXone (ROCEPHIN) 1 g in sodium chloride 0.9 % 100 mL IVPB        1 g 200 mL/hr over 30 Minutes Intravenous  Once 07/24/20 1937 07/24/20 2056   07/24/20 1945  azithromycin (ZITHROMAX) 500 mg in sodium chloride 0.9 % 250 mL IVPB        500 mg 250 mL/hr over 60 Minutes Intravenous Every 24 hours 07/24/20 1937          Objective:   Vitals:   07/25/20 2132 07/26/20 0600 07/26/20 0638 07/26/20 0909  BP: 135/63  105/81   Pulse: 75  68 72  Resp: 16  16   Temp: 97.9 F (36.6 C)  97.7 F (36.5 C)   TempSrc:      SpO2: 98%  99%   Weight:  80.4 kg    Height:        Wt Readings from Last 3 Encounters:  07/26/20 80.4 kg  07/10/20 80.1 kg  04/25/20 81.6 kg     Intake/Output Summary (Last 24 hours) at 07/26/2020 1120 Last data filed at 07/26/2020 1045 Gross per 24  hour  Intake 794.78 ml  Output 3500 ml  Net -2705.22 ml     Physical Exam  Gen:- Awake Alert,  In no apparent distress  HEENT:- [place].AT, No sclera icterus Nose-  3L/min Neck-Supple Neck,No JVD,.  Lungs-diminished breath sounds especially on the right, scattered rales  CV- S1, S2 normal, regular  Abd-  +ve B.Sounds, Abd Soft, No tenderness,    Extremity/Skin:- improving lower extremity pitting edema, pedal pulses present  Psych-affect is appropriate, oriented x3 Neuro-generalized weakness, ambulatory dysfunction, chronic neuro-muscular deficits, no additional new focal deficits, no tremors  Data Review:   Micro Results Recent Results (from the past 240 hour(s))  Resp Panel by RT-PCR (Flu A&B, Covid) Nasopharyngeal Swab     Status: None   Collection Time: 07/24/20  2:59 PM   Specimen: Nasopharyngeal Swab; Nasopharyngeal(NP) swabs in vial transport medium  Result Value Ref Range Status   SARS Coronavirus 2 by RT PCR NEGATIVE NEGATIVE Final    Comment: (NOTE) SARS-CoV-2 target nucleic acids are NOT DETECTED.  The SARS-CoV-2 RNA is generally detectable in upper respiratory specimens during the acute phase of infection. The lowest concentration of SARS-CoV-2 viral copies this assay can detect is 138 copies/mL. A negative result does not preclude SARS-Cov-2 infection and should not be used as the sole basis for treatment or other patient management decisions. A negative result may occur with  improper specimen collection/handling, submission of specimen other than nasopharyngeal swab, presence of viral mutation(s) within the areas targeted by this assay, and inadequate number of viral copies(<138 copies/mL). A negative result must be combined with clinical observations, patient history, and epidemiological information. The expected result is Negative.  Fact Sheet for Patients:  BloggerCourse.com  Fact Sheet for Healthcare Providers:   SeriousBroker.it  This test is no t yet approved or cleared by the [place] FDA and  has been authorized for detection and/or diagnosis of SARS-CoV-2 by FDA under an Emergency Use Authorization (EUA). This EUA will remain  in effect (meaning this test can be used) for the duration of the COVID-19 declaration under Section 564(b)(1) of the Act, 21 U.S.C.section 360bbb-3(b)(1), unless the authorization is terminated  or revoked sooner.       Influenza A by PCR NEGATIVE NEGATIVE Final   Influenza B by PCR NEGATIVE NEGATIVE Final    Comment: (NOTE) The Xpert Xpress SARS-CoV-2/FLU/RSV plus assay is intended as an aid in the diagnosis of influenza from Nasopharyngeal swab specimens and should not be used as a sole basis for treatment. Nasal washings and aspirates are unacceptable for Xpert Xpress SARS-CoV-2/FLU/RSV testing.  Fact Sheet for Patients: BloggerCourse.com  Fact Sheet for Healthcare Providers: SeriousBroker.it  This test is not yet approved or cleared by the [place] FDA and has been authorized for detection and/or diagnosis of SARS-CoV-2 by FDA under an Emergency Use Authorization (EUA). This EUA will remain in effect (meaning this test can be used) for the duration of the COVID-19 declaration under Section 564(b)(1) of the Act, 21 U.S.C. section 360bbb-3(b)(1), unless the authorization is terminated or revoked.  Performed at [facility], [address] [place], [place] [address]   Culture, blood (Routine X 2) w Reflex to ID Panel     Status: None (Preliminary result)   Collection Time: 07/24/20  9:23 PM   Specimen: BLOOD LEFT WRIST  Result Value Ref Range Status   Specimen Description BLOOD LEFT WRIST  Final   Special Requests   Final    BOTTLES DRAWN AEROBIC AND ANAEROBIC Blood Culture results may not be optimal due to an inadequate volume of blood received in culture bottles    Culture   Final    NO GROWTH 2 DAYS Performed at [facility], [address] [place], [place] [address]    Report Status PENDING  Incomplete  Culture, blood (Routine X 2) w Reflex to ID Panel     Status: None (Preliminary result)   Collection Time: 07/24/20  9:29 PM   Specimen: BLOOD LEFT HAND  Result Value Ref Range Status   Specimen Description BLOOD LEFT HAND  Final  Special Requests   Final    BOTTLES DRAWN AEROBIC ONLY Blood Culture adequate volume   Culture   Final    NO GROWTH 2 DAYS Performed at [facility], [address] [place], [place] [address]    Report Status PENDING  Incomplete    Radiology Reports DG Chest 2 View  Result Date: 07/10/2020 CLINICAL DATA:  COPD. EXAM: CHEST - 2 VIEW COMPARISON:  06/18/2019. FINDINGS: Mediastinum and hilar structures normal. Heart size stable. Diffuse bilateral prominent interstitial infiltrates and or edema noted. Small bilateral pleural effusions. No pneumothorax. Postsurgical changes right humerus. IMPRESSION: Diffuse bilateral prominent interstitial infiltrates and or edema. Pneumonitis and or CHF could present this fashion. Small bilateral pleural effusions. Electronically Signed   By: Maisie Fushomas  Register   On: 07/10/2020 12:27   DG Chest Portable 1 View  Result Date: 07/24/2020 CLINICAL DATA:  Shortness of breath. EXAM: PORTABLE CHEST 1 VIEW COMPARISON:  July 10, 2020. FINDINGS: Mildly enlarged cardiac silhouette. Aortic atherosclerosis. Pulmonary vascular congestion. Mild diffuse interstitial prominence. Left basilar opacities. Interval increase in a small to moderate right pleural effusion with fluid layering along the major fissure. No visible pneumothorax. IMPRESSION: 1. Suspected congestive heart failure with mild cardiomegaly, pulmonary vascular congestion and likely mild interstitial edema. Increased small to moderate right pleural effusion. 2. Left basilar opacities, which could represent alveolar edema, atelectasis,  aspiration, or pneumonia. Electronically Signed   By: Feliberto HartsFrederick S Jones MD   On: 07/24/2020 15:48   ECHOCARDIOGRAM COMPLETE  Result Date: 07/25/2020    ECHOCARDIOGRAM REPORT   Patient Name:   Carrie DikeFRANCES A Mckown Date of Exam: 07/25/2020 Medical Rec #:  [id number]        Height:       68.0 in Accession #:    [id number]       Weight:       175.7 lb Date of Birth:  1928/05/15        BSA:          1.934 m Patient Age:    89 years         BP:           145/68 mmHg Patient Gender: F                HR:           73 bpm. [facility] Procedure: 2D Echo, Cardiac Doppler, Color Doppler and Intracardiac            Opacification Agent Indications:    CHF  History:        Patient has prior history of Echocardiogram examinations, most                 recent 06/20/2019. CHF, COPD and Stroke, Arrythmias:Atrial                 Fibrillation, Signs/Symptoms:Shortness of Breath; Risk                 Factors:Hypertension. Pleural effusion.  Sonographer:    Lavenia AtlasBrooke Strickland RDCS Referring Phys: [id number] OLADAPO ADEFESO IMPRESSIONS  1. Left ventricular ejection fraction, by estimation, is 70 to 75%. The left ventricle has hyperdynamic function. The left ventricle has no regional wall motion abnormalities. There is mild left ventricular hypertrophy. Left ventricular diastolic parameters are indeterminate. Elevated left atrial pressure.  2. Right ventricular systolic function is normal. The right ventricular size is normal.  3. Left atrial size was moderately dilated.  4. Right atrial size was moderately  dilated.  5. The mitral valve is normal in structure. No evidence of mitral valve regurgitation. No evidence of mitral stenosis.  6. The aortic valve is tricuspid. There is mild calcification of the aortic valve. There is mild thickening of the aortic valve. Aortic valve regurgitation is not visualized. No aortic stenosis is present. FINDINGS  Left Ventricle: Left ventricular ejection fraction, by estimation, is 70 to 75%.  The left ventricle has hyperdynamic function. The left ventricle has no regional wall motion abnormalities. The left ventricular internal cavity size was normal in size. There is mild left ventricular hypertrophy. Left ventricular diastolic parameters are indeterminate. Elevated left atrial pressure. Right Ventricle: The right ventricular size is normal. No increase in right ventricular wall thickness. Right ventricular systolic function is normal. Left Atrium: Left atrial size was moderately dilated. Right Atrium: Right atrial size was moderately dilated. Pericardium: There is no evidence of pericardial effusion. Mitral Valve: The mitral valve is normal in structure. There is mild thickening of the mitral valve leaflet(s). There is mild calcification of the mitral valve leaflet(s). Mild mitral annular calcification. No evidence of mitral valve regurgitation. No evidence of mitral valve stenosis. Tricuspid Valve: The tricuspid valve is normal in structure. Tricuspid valve regurgitation is not demonstrated. No evidence of tricuspid stenosis. Aortic Valve: The aortic valve is tricuspid. There is mild calcification of the aortic valve. There is mild thickening of the aortic valve. There is mild aortic valve annular calcification. Aortic valve regurgitation is not visualized. No aortic stenosis  is present. Aortic valve mean gradient measures 5.0 mmHg. Aortic valve peak gradient measures 11.1 mmHg. Aortic valve area, by VTI measures 1.73 cm. Pulmonic Valve: The pulmonic valve was not well visualized. Pulmonic valve regurgitation is not visualized. No evidence of pulmonic stenosis. Aorta: The aortic root is normal in size and structure. Pulmonary Artery: Indeterminant PASP, inadequate TR jet. IAS/Shunts: No atrial level shunt detected by color flow Doppler.  LEFT VENTRICLE PLAX 2D LVIDd:         4.30 cm  Diastology LVIDs:         2.48 cm  LV e' medial:   3.92 cm/s LV PW:         1.12 cm  LV E/e' medial: 20.3 LV IVS:         1.07 cm LVOT diam:     2.10 cm LV SV:         52 LV SV Index:   27 LVOT Area:     3.46 cm  RIGHT VENTRICLE RV Basal diam:  2.92 cm RV S prime:     8.49 cm/s TAPSE (M-mode): 1.9 cm LEFT ATRIUM             Index       RIGHT ATRIUM           Index LA diam:        3.70 cm 1.91 cm/m  RA Area:     21.60 cm LA Vol (A2C):   62.4 ml 32.26 ml/m RA Volume:   68.30 ml  35.31 ml/m LA Vol (A4C):   68.6 ml 35.47 ml/m LA Biplane Vol: 66.2 ml 34.22 ml/m  AORTIC VALVE AV Area (Vmax):    1.69 cm AV Area (Vmean):   1.69 cm AV Area (VTI):     1.73 cm AV Vmax:           166.96 cm/s AV Vmean:          100.510 cm/s AV VTI:  0.297 m AV Peak Grad:      11.1 mmHg AV Mean Grad:      5.0 mmHg LVOT Vmax:         81.41 cm/s LVOT Vmean:        49.131 cm/s LVOT VTI:          0.149 m LVOT/AV VTI ratio: 0.50  AORTA Ao Root diam: 2.60 cm MITRAL VALVE MV Area (PHT): 4.04 cm    SHUNTS MV Decel Time: 188 msec    Systemic VTI:  0.15 m MV E velocity: 79.70 cm/s  Systemic Diam: 2.10 cm MV A velocity: 26.10 cm/s MV E/A ratio:  3.05 MV A Prime:    6.6 cm/s Dina Rich MD Electronically signed by Dina Rich MD Signature Date/Time: 07/25/2020/11:33:13 AM    Final      CBC Recent Labs  Lab 07/24/20 1507 07/25/20 0602  WBC 12.0* 8.2  HGB 12.9 12.1  HCT 40.6 38.0  PLT 220 192  MCV 95.3 94.3  MCH 30.3 30.0  MCHC 31.8 31.8  RDW 14.7 14.6  LYMPHSABS 1.3  --   MONOABS 0.9  --   EOSABS 0.1  --   BASOSABS 0.1  --     Chemistries  Recent Labs  Lab 07/24/20 1507 07/25/20 0602  NA 133* 135  K 4.4 3.3*  CL 94* 92*  CO2 32 33*  GLUCOSE 129* 103*  BUN 12 14  CREATININE 0.90 1.00  CALCIUM 10.0 9.5  MG  --  2.0  AST  --  17  ALT  --  11  ALKPHOS  --  100  BILITOT  --  1.2   ------------------------------------------------------------------------------------------------------------------ No results for input(s): CHOL, HDL, LDLCALC, TRIG, CHOLHDL, LDLDIRECT in the last 72 hours.  Lab Results  Component  Value Date   HGBA1C 5.9 (H) 06/20/2019   ------------------------------------------------------------------------------------------------------------------ No results for input(s): TSH, T4TOTAL, T3FREE, THYROIDAB in the last 72 hours.  Invalid input(s): FREET3 ------------------------------------------------------------------------------------------------------------------ No results for input(s): VITAMINB12, FOLATE, FERRITIN, TIBC, IRON, RETICCTPCT in the last 72 hours.  Coagulation profile Recent Labs  Lab 07/25/20 0602  INR 1.8*    No results for input(s): DDIMER in the last 72 hours.  Cardiac Enzymes No results for input(s): CKMB, TROPONINI, MYOGLOBIN in the last 168 hours.  Invalid input(s): CK ------------------------------------------------------------------------------------------------------------------    Component Value Date/Time   BNP 429.0 (H) 07/24/2020 1507   Shon Hale M.D on 07/26/2020 at 11:20 AM  Go to [email or web address] - for contact info  [facility] - Office  [phone]

## 2020-07-27 ENCOUNTER — Inpatient Hospital Stay (HOSPITAL_COMMUNITY): Payer: Medicare Other

## 2020-07-27 LAB — BASIC METABOLIC PANEL
Anion gap: 8 (ref 5–15)
BUN: 21 mg/dL (ref 8–23)
CO2: 36 mmol/L — ABNORMAL HIGH (ref 22–32)
Calcium: 9.5 mg/dL (ref 8.9–10.3)
Chloride: 93 mmol/L — ABNORMAL LOW (ref 98–111)
Creatinine, Ser: 1.01 mg/dL — ABNORMAL HIGH (ref 0.44–1.00)
GFR, Estimated: 53 mL/min — ABNORMAL LOW (ref >60)
Glucose, Bld: 118 mg/dL — ABNORMAL HIGH (ref 70–99)
Potassium: 3.4 mmol/L — ABNORMAL LOW (ref 3.5–5.1)
Sodium: 137 mmol/L (ref 135–145)

## 2020-07-27 LAB — CBC
HCT: 39.8 % (ref 36.0–46.0)
Hemoglobin: 12.7 g/dL (ref 12.0–15.0)
MCH: 29.8 pg (ref 26.0–34.0)
MCHC: 31.9 g/dL (ref 30.0–36.0)
MCV: 93.4 fL (ref 80.0–100.0)
Platelets: 231 10*3/uL (ref 150–400)
RBC: 4.26 MIL/uL (ref 3.87–5.11)
RDW: 14.5 % (ref 11.5–15.5)
WBC: 12.1 10*3/uL — ABNORMAL HIGH (ref 4.0–10.5)
nRBC: 0 % (ref 0.0–0.2)

## 2020-07-27 MED ORDER — HYDRALAZINE HCL 10 MG PO TABS: 10.0000 mg | ORAL_TABLET | Freq: Two times a day (BID) | ORAL | 3 refills | Status: DC

## 2020-07-27 MED ORDER — POTASSIUM CHLORIDE CRYS ER 20 MEQ PO TBCR
40.0000 meq | EXTENDED_RELEASE_TABLET | ORAL | Status: AC
  Administered 2020-07-27 (×2): 40 meq via ORAL
  Filled 2020-07-27 (×2): qty 2

## 2020-07-27 MED ORDER — PREDNISONE 10 MG PO TABS: 10.0000 mg | ORAL_TABLET | Freq: Every day | ORAL | 0 refills | Status: AC

## 2020-07-27 MED ORDER — APIXABAN 5 MG PO TABS: 5.0000 mg | ORAL_TABLET | Freq: Two times a day (BID) | ORAL | 5 refills | Status: AC

## 2020-07-27 MED ORDER — POTASSIUM CHLORIDE ER 20 MEQ PO TBCR: 1.0000 | EXTENDED_RELEASE_TABLET | Freq: Every day | ORAL | 4 refills | Status: AC

## 2020-07-27 MED ORDER — TORSEMIDE 20 MG PO TABS: 20.0000 mg | ORAL_TABLET | ORAL | 4 refills | Status: DC

## 2020-07-27 MED ORDER — AZITHROMYCIN 250 MG PO TABS
500.0000 mg | ORAL_TABLET | Freq: Once | ORAL | Status: AC
  Administered 2020-07-27: 500 mg via ORAL
  Filled 2020-07-27: qty 2

## 2020-07-27 NOTE — TOC Transition Note (Signed)
Transition of Care [facility]) - CM/SW Discharge Note   Patient Details  Name: JAQLYN GRUENHAGEN MRN: [id number] Date of Birth: 15-Dec-1927  Transition of Care [facility]) CM/SW Contact:  Barry Brunner, LCSW Phone Number: 07/27/2020, 3:33 PM   Clinical Narrative:    CSW received consult for [facility]. CSW contacted patient to inquire about agreeableness to [facility] and previous [facility] providers. Patient reported that she was previously with [facility]. Denyse Amass with [facility] agreeable to provide HHPT for patient. TOC signing off.    Final next level of care: Home w Home Health Services Barriers to Discharge: Barriers Resolved   Patient Goals and CMS Choice Patient states their goals for this hospitalization and ongoing recovery are:: Return home   Choice offered to / list presented to : Patient  Discharge Placement                    Patient and family notified of of transfer: 07/27/20  Discharge Plan and Services In-house Referral: Clinical Social Work Discharge Planning Services: CM Consult Post Acute Care Choice: Home Health                    HH Arranged: PT [facility] Agency: [facility] Date [facility] Agency Contacted: 07/27/20 Time HH Agency Contacted: 1530 Representative spoke with at [facility] Agency: Denyse Amass  Social Determinants of Health (SDOH) Interventions     Readmission Risk Interventions No flowsheet data found.

## 2020-07-27 NOTE — Discharge Instructions (Signed)
1)Very low-salt diet advised 2)Weigh yourself daily if able to , call if you gain more than 3 pounds in 1 day or more than 5 pounds in 1 week as your diuretic medications may need to be adjusted 3) avoid excessive fluid intake 4)Take Torsemide/Demadex 2 Tablets (40 mg) on Tuesday, Thursday, Saturday and Sunday, Take 1 Tab (20 mg) on Monday, Wednesday and Friday--- 5) CBC and BMP blood test with primary care physician in about a week 6) complete prednisone over the next 5 days as prescribed

## 2020-07-27 NOTE — Discharge Summary (Signed)
Carrie Best, is a 86 y.o. female  DOB 07/24/1928  MRN [id number].  Admission date:  07/24/2020  Admitting Physician  Frankey Shown, DO  Discharge Date:  07/27/2020   Primary MD  Kirby Funk, MD  Recommendations for primary care physician for things to follow:   1)Very low-salt diet advised 2)Weigh yourself daily if able to , call if you gain more than 3 pounds in 1 day or more than 5 pounds in 1 week as your diuretic medications may need to be adjusted 3) avoid excessive fluid intake 4)Take Torsemide/Demadex 2 Tablets (40 mg) on Tuesday, Thursday, Saturday and Sunday, Take 1 Tab (20 mg) on Monday, Wednesday and Friday--- 5) CBC and BMP blood test with primary care physician in about a week 6) complete prednisone over the next 5 days as prescribed  Admission Diagnosis  SOB (shortness of breath) [R06.02] Acute exacerbation of CHF (congestive heart failure) (HCC) [I50.9] Acute on chronic congestive heart failure, unspecified heart failure type (HCC) [I50.9]   Discharge Diagnosis  SOB (shortness of breath) [R06.02] Acute exacerbation of CHF (congestive heart failure) (HCC) [I50.9] Acute on chronic congestive heart failure, unspecified heart failure type (HCC) [I50.9]    Principal Problem:   Acute exacerbation of CHF (congestive heart failure) (HCC) Active Problems:   HTN (hypertension)   Hyperlipidemia with target LDL less than 70   Atrial fibrillation (HCC)   OSA (obstructive sleep apnea)   Nocturnal hypoxia   Constipation   Leukocytosis   Elevated brain natriuretic peptide (BNP) level   CAP (community acquired pneumonia)   Acute on chronic heart failure with preserved ejection fraction (HFpEF) (HCC)      Past Medical History:  Diagnosis Date  . Anxiety   . Arthritis   . Congestive heart failure (HCC)    Congestive heart failure symptoms  . Depression   . Diastolic dysfunction    . Dyspnea    occasionally, with exertion  . Dysrhythmia 05-10-12   hx. A. Fib., rate is controlled-tx. Xarelto  . GERD (gastroesophageal reflux disease) 05-10-12   tx. with meds as needed  . Hearing loss 05-10-12   bilateral hearing aids.  . Heart murmur   . History of kidney stones   . Hyperlipidemia   . Hypertension   . Pneumonia 05-10-12   dx. in July- tx. antibiotics  . Stroke (HCC) 05-10-12   Lt.CVA note on scans-hx. freq. falls/ none past 6 months 01/21/14  . Wears hearing aid    bilateral    Past Surgical History:  Procedure Laterality Date  . APPENDECTOMY    . CATARACT EXTRACTION, BILATERAL  05-10-12   bilateral  . CYSTOSCOPY WITH RETROGRADE PYELOGRAM, URETEROSCOPY AND STENT PLACEMENT Right 01/31/2014   Procedure: CYSTOSCOPY WITH bilateral  RETROGRADE PYELOGRAM, right URETEROSCOPY AND right  STENT PLACEMENT, bladder biopsy with fulgeration;  Surgeon: Sebastian Ache, MD;  Location: [facility];  Service: Urology;  Laterality: Right;  . HARDWARE REMOVAL Right 09/09/2014   Procedure: HARDWARE REMOVAL RIGHT SHOULDER;  Surgeon: Mable Paris, MD;  Location:  [facility];  Service: Orthopedics;  Laterality: Right;  Right shoulder hardware removal  . HOLMIUM LASER APPLICATION Right 01/31/2014   Procedure: HOLMIUM LASER APPLICATION;  Surgeon: Sebastian Acheheodore Manny, MD;  Location: [facility];  Service: Urology;  Laterality: Right;  . HUMERUS IM NAIL Right 06/10/2014   Procedure: INTRAMEDULLARY (IM) NAIL HUMERAL;  Surgeon: Mable ParisJustin William Chandler, MD;  Location: [facility];  Service: Orthopedics;  Laterality: Right;  Right intramedullary humeral nail  . INTERSTIM IMPLANT PLACEMENT  05-10-12   now malfunctioning  . INTERSTIM IMPLANT REMOVAL  05/16/2012   Procedure: REMOVAL OF INTERSTIM IMPLANT;  Surgeon: Martina SinnerScott A MacDiarmid, MD;  Location: [facility];  Service: Urology;  Laterality: N/A;  . JOINT REPLACEMENT    . OTHER SURGICAL HISTORY     Bladder Surgery  . SHOULDER SURGERY    . TOTAL KNEE  ARTHROPLASTY Right 02/17/2015   Procedure: TOTAL KNEE ARTHROPLASTY;  Surgeon: Gean BirchwoodFrank Rowan, MD;  Location: [facility];  Service: Orthopedics;  Laterality: Right;     HPI  from the history and physical done on the day of admission:    Chief Complaint: Shortness of breath  HPI: Carrie DikeFrances A Osburn is a 86 y.o. female with medical history significant for CHF, hypertension, hyperlipidemia, COPD on supplemental oxygen at 3 LPM at home, afebrile Eliquis, constipation and OSA (not on CPAP) who presents to the emergency department due to 3-day history of worsening shortness of breath which was worse at night.  She does not believe that shortness of breath was related to lying flat, she denies chest pain, increased leg swelling, fever, chills, cough. Patient follows with Dr. Virl Diamondlalere Adewale (pulmonologist) and she was recently diagnosed to have mild obstructive sleep apnea with severe oxygen desaturations.  She saw her pulmonologist yesterday and she was prescribed with prednisone 10 mg twice a day for 3 to 5 days, however, patient is yet to start with this medication.  She denies any sick contacts and she states that she has been vaccinated for COVID-19.  ED Course:  In the emergency department, she was initially tachypneic.  Other vital signs are within normal range except for BP at 154/79 on arrival to the ED.  Work-up in the ED showed leukocytosis, mild hyponatremia, elevated BNP, troponin x2 11>12.  Respiratory panel for influenza A, B and SARS coronavirus 2 was negative.  Chest x-ray showed Suspected congestive heart failure with mild cardiomegaly, pulmonary vascular congestion and likely mild interstitial edema. Increased small to moderate right pleural effusion.  Breathing treatment with albuterol was provided, IV Lasix 40 Mg x1 was given and patient was empirically started on IV antibiotics for presumed community-acquired pneumonia.  Hospitalist was asked to admit patient for further evaluation management.    Hospital Course:    Brief Summary:- 86 y.o.femalewith medical history significant fordCHF, HTN, HLD, COPD on supplemental oxygen at 3LPM at home, afib on  Eliquis, constipationandOSA (not on CPAP) admitted on 07/24/2020 with worsening hypoxia and dyspnea and found to have CHF with bilateral right more than left pleural effusions  A/p 1) acute on chronic hypoxic respiratory failure--- patient was started on home O2 at 2 to 3 L couple weeks ago PTA -During this admission patient's O2 sats dropped to 86% on 3 L of oxygen via nasal cannula, oxygen had to be titrated up to 5 L to keep O2 sats around 90% -Hypoxia improved with IV diuresis, oxygen requirement is back to 2 L/min via nasal cannula=-which is consistent with patient's recent baseline  2) acute  on chronic diastolic CHF exacerbation--patient failed oral torsemide at home -EF is 70 to 75% per echo from 07/25/2020 -Treated with IV diuresis with IV Lasix,  -Fluid balance negative and weight is down at least 4 pounds -Discharge on adjusted dose of torsemide -Repeat chest x-ray on 07/27/2020 shows significant improvement , with near complete resolution of patient's bilateral right more than left pleural effusions  3) suspicion of community-acquired pneumonia--- patient was treated with Rocephin and azithromycin--repeat chest x-ray after diuresis without evidence for pneumonia -No further antibiotics needed at this time -Leukocytosis is most likely due to steroid therapy - 4) chronic atrial fibrillation--- stable, continue Eliquis for stroke prophylaxis, continue metoprolol and Cardizem for rate control  5) generalized weakness/deconditioning/ambulatory dysfunction--- Patient requires frequent re-positioning of the body in ways that cannot be achieved with an ordinary bed or wedge pillow, to eliminate pain, reduce pressure, and the head of the bed to be elevated more than 30 degrees most of the time due to--congestive heart failure  with persistent chronic hypoxic respiratory failure --- Patient suffers from chronic hypoxic respiratory failure secondary to chronic diastolic congestive heart failure, patient also suffers from ambulatory dysfunction which impairs their ability to perform daily activities like ambulating in the home. A walker will not resolve issue with performing activities of daily living.  -Lift chair/recliner will be more appropriate to help patient perform activities of daily living including washing/bathing , brushing her teeth and grooming  Disposition--- discharge home with home health physical therapy  Disposition: The patient is from: Home  Anticipated d/c is to: Home  Code Status : Full  Family Communication:  (patient is alert, awake and coherent)  Discussed with daughter-in-law prior to discharge  Consults  :  na  Discharge Condition: Stable, oxygen requirement is back to baseline  Follow UP   Follow-up Information    Kirby Funk, MD. Call in 1 week(s).   Specialty: Internal Medicine Why: Repeat CBC and BMP blood test Contact information: [address] [place] [address] [phone]        Nahser, Deloris Ping, MD .   Specialty: Cardiology Contact information: [address] [place] [address] [phone]              Diet and Activity recommendation:  As advised  Discharge Instructions    Discharge Instructions    Call MD for:  difficulty breathing, headache or visual disturbances   Complete by: As directed    Call MD for:  persistant dizziness or light-headedness   Complete by: As directed    Call MD for:  persistant nausea and vomiting   Complete by: As directed    Call MD for:  temperature >100.4   Complete by: As directed    Diet - low sodium heart healthy   Complete by: As directed    Discharge instructions   Complete by: As directed    1)Very low-salt diet advised 2)Weigh yourself  daily if able to , call if you gain more than 3 pounds in 1 day or more than 5 pounds in 1 week as your diuretic medications may need to be adjusted 3) avoid excessive fluid intake 4)Take Torsemide/Demadex 2 Tablets (40 mg) on Tuesday, Thursday, Saturday and Sunday, Take 1 Tab (20 mg) on Monday, Wednesday and Friday--- 5) CBC and BMP blood test with primary care physician in about a week 6) complete prednisone over the next 5 days as prescribed   Increase activity slowly   Complete  by: As directed       Discharge Medications     Allergies as of 07/27/2020      Reactions   Codeine Nausea And Vomiting   Penicillins Hives   Has patient had a PCN reaction causing immediate rash, facial/tongue/throat swelling, SOB or lightheadedness with hypotension: No Has patient had a PCN reaction causing severe rash involving mucus membranes or skin necrosis: No Has patient had a PCN reaction that required hospitalization No Has patient had a PCN reaction occurring within the last 10 years: No If all of the above answers are "NO", then may proceed with Cephalosporin use.   Sulfur Nausea And Vomiting      Medication List    STOP taking these medications   azithromycin 250 MG tablet Commonly known as: ZITHROMAX     TAKE these medications   apixaban 5 MG Tabs tablet Commonly known as: ELIQUIS Take 1 tablet (5 mg total) by mouth 2 (two) times daily.   atorvastatin 40 MG tablet Commonly known as: LIPITOR Take 1 tablet (40 mg total) by mouth daily.   Cartia XT 180 MG 24 hr capsule Generic drug: diltiazem Take 180 mg by mouth daily.   DULoxetine 60 MG capsule Commonly known as: CYMBALTA Take 60 mg by mouth daily.   hydrALAZINE 10 MG tablet Commonly known as: APRESOLINE Take 1 tablet (10 mg total) by mouth 2 (two) times daily. What changed: additional instructions   HYDROcodone-acetaminophen 5-325 MG tablet Commonly known as: NORCO/VICODIN Take 1 tablet by mouth every 12 (twelve) hours  as needed for moderate pain or severe pain.   isosorbide mononitrate 30 MG 24 hr tablet Commonly known as: IMDUR TAKE 1 TABLET(30 MG) BY MOUTH DAILY   LORazepam 0.5 MG tablet Commonly known as: ATIVAN Take 0.5 mg by mouth 3 (three) times daily as needed.   metoprolol tartrate 50 MG tablet Commonly known as: LOPRESSOR Take 50 mg by mouth 2 (two) times daily.   polyethylene glycol 17 g packet Commonly known as: MIRALAX / GLYCOLAX Take 17 g by mouth daily.   Potassium Chloride ER 20 MEQ Tbcr Take 1 tablet by mouth daily.   predniSONE 10 MG tablet Commonly known as: DELTASONE Take 1 tablet (10 mg total) by mouth daily with breakfast for 5 days. What changed: when to take this   torsemide 20 MG tablet Commonly known as: Demadex Take 1 tablet (20 mg total) by mouth See admin instructions. Take Torsemide/Demadex 2 Tablets (40 mg) on Tuesday, Thursday, Saturday and Sunday, Take 1 Tab (20 mg) on Monday, Wednesday and Friday--- What changed:   when to take this  additional instructions   zolpidem 10 MG tablet Commonly known as: AMBIEN Take 10 mg by mouth at bedtime.            Durable Medical Equipment  (From admission, onward)         Start     Ordered   07/25/20 1232  For home use only DME Other see comment  Once       Comments: Patient requires frequent re-positioning of the body in ways that cannot be achieved with an ordinary bed or wedge pillow, to eliminate pain, reduce pressure, and the head of the bed to be elevated more than 30 degrees most of the time due to--congestive heart failure with persistent chronic hypoxic respiratory failure --- Patient suffers from chronic hypoxic respiratory failure secondary to chronic diastolic congestive heart failure, patient also suffers from ambulatory dysfunction and bilateral ankle deformities  which impairs their ability to perform daily activities like ambulating in the home. A walker will not resolve issue with performing  activities of daily living.  -Lift chair/recliner will be more appropriate to help patient perform activities of daily living including washing/bathing , brushing her teeth and grooming  Question:  Length of Need  Answer:  [employer]   07/25/20 1231   07/25/20 1225  For home use only DME Other see comment  Once       Comments: Patient requires frequent re-positioning of the body in ways that cannot be achieved with an ordinary bed or wedge pillow, to eliminate pain, reduce pressure, and the head of the bed to be elevated more than 30 degrees most of the time due to--congestive heart failure with persistent chronic hypoxic respiratory failure --- Patient suffers from chronic hypoxic respiratory failure secondary to chronic diastolic congestive heart failure, patient also suffers from ambulatory dysfunction and bilateral ankle deformities which impairs their ability to perform daily activities like ambulating in the home. A walker will not resolve issue with performing activities of daily living.  -Lift chair/recliner will be more appropriate to help patient perform activities of daily living including washing/bathing , brushing her teeth and grooming  Question:  Length of Need  Answer:  Lifetime   07/25/20 1229          Major procedures and Radiology Reports - PLEASE review detailed and final reports for all details, in brief -   DG Chest 2 View  Result Date: 07/27/2020 CLINICAL DATA:  Shortness of breath. EXAM: CHEST - 2 VIEW COMPARISON:  July 24, 2020 FINDINGS: Heart, hila, and mediastinum are unchanged. No pneumothorax. No nodules or masses. Increased interstitial markings in the lungs suggest pulmonary venous congestion/mild edema. No other acute abnormalities. IMPRESSION: Mild cardiomegaly and mild pulmonary edema.  No other change. Electronically Signed   By: Gerome Sam III M.D   On: 07/27/2020 12:40   DG Chest 2 View  Result Date: 07/10/2020 CLINICAL DATA:  COPD. EXAM: CHEST - 2  VIEW COMPARISON:  06/18/2019. FINDINGS: Mediastinum and hilar structures normal. Heart size stable. Diffuse bilateral prominent interstitial infiltrates and or edema noted. Small bilateral pleural effusions. No pneumothorax. Postsurgical changes right humerus. IMPRESSION: Diffuse bilateral prominent interstitial infiltrates and or edema. Pneumonitis and or CHF could present this fashion. Small bilateral pleural effusions. Electronically Signed   By: Maisie Fus  Register   On: 07/10/2020 12:27   DG Chest Portable 1 View  Result Date: 07/24/2020 CLINICAL DATA:  Shortness of breath. EXAM: PORTABLE CHEST 1 VIEW COMPARISON:  July 10, 2020. FINDINGS: Mildly enlarged cardiac silhouette. Aortic atherosclerosis. Pulmonary vascular congestion. Mild diffuse interstitial prominence. Left basilar opacities. Interval increase in a small to moderate right pleural effusion with fluid layering along the major fissure. No visible pneumothorax. IMPRESSION: 1. Suspected congestive heart failure with mild cardiomegaly, pulmonary vascular congestion and likely mild interstitial edema. Increased small to moderate right pleural effusion. 2. Left basilar opacities, which could represent alveolar edema, atelectasis, aspiration, or pneumonia. Electronically Signed   By: Feliberto Harts MD   On: 07/24/2020 15:48   ECHOCARDIOGRAM COMPLETE  Result Date: 07/25/2020    ECHOCARDIOGRAM REPORT   Patient Name:   Carrie Best Date of Exam: 07/25/2020 Medical Rec #:  [id number]        Height:       68.0 in Accession #:    [id number]       Weight:       175.7 lb Date  of Birth:  December 25, 1927        BSA:          1.934 m Patient Age:    89 years         BP:           145/68 mmHg Patient Gender: F                HR:           73 bpm. [facility] Procedure: 2D Echo, Cardiac Doppler, Color Doppler and Intracardiac            Opacification Agent Indications:    CHF  History:        Patient has prior history of Echocardiogram  examinations, most                 recent 06/20/2019. CHF, COPD and Stroke, Arrythmias:Atrial                 Fibrillation, Signs/Symptoms:Shortness of Breath; Risk                 Factors:Hypertension. Pleural effusion.  Sonographer:    Lavenia Atlas RDCS Referring Phys: [id number] OLADAPO ADEFESO IMPRESSIONS  1. Left ventricular ejection fraction, by estimation, is 70 to 75%. The left ventricle has hyperdynamic function. The left ventricle has no regional wall motion abnormalities. There is mild left ventricular hypertrophy. Left ventricular diastolic parameters are indeterminate. Elevated left atrial pressure.  2. Right ventricular systolic function is normal. The right ventricular size is normal.  3. Left atrial size was moderately dilated.  4. Right atrial size was moderately dilated.  5. The mitral valve is normal in structure. No evidence of mitral valve regurgitation. No evidence of mitral stenosis.  6. The aortic valve is tricuspid. There is mild calcification of the aortic valve. There is mild thickening of the aortic valve. Aortic valve regurgitation is not visualized. No aortic stenosis is present. FINDINGS  Left Ventricle: Left ventricular ejection fraction, by estimation, is 70 to 75%. The left ventricle has hyperdynamic function. The left ventricle has no regional wall motion abnormalities. The left ventricular internal cavity size was normal in size. There is mild left ventricular hypertrophy. Left ventricular diastolic parameters are indeterminate. Elevated left atrial pressure. Right Ventricle: The right ventricular size is normal. No increase in right ventricular wall thickness. Right ventricular systolic function is normal. Left Atrium: Left atrial size was moderately dilated. Right Atrium: Right atrial size was moderately dilated. Pericardium: There is no evidence of pericardial effusion. Mitral Valve: The mitral valve is normal in structure. There is mild thickening of the mitral valve  leaflet(s). There is mild calcification of the mitral valve leaflet(s). Mild mitral annular calcification. No evidence of mitral valve regurgitation. No evidence of mitral valve stenosis. Tricuspid Valve: The tricuspid valve is normal in structure. Tricuspid valve regurgitation is not demonstrated. No evidence of tricuspid stenosis. Aortic Valve: The aortic valve is tricuspid. There is mild calcification of the aortic valve. There is mild thickening of the aortic valve. There is mild aortic valve annular calcification. Aortic valve regurgitation is not visualized. No aortic stenosis  is present. Aortic valve mean gradient measures 5.0 mmHg. Aortic valve peak gradient measures 11.1 mmHg. Aortic valve area, by VTI measures 1.73 cm. Pulmonic Valve: The pulmonic valve was not well visualized. Pulmonic valve regurgitation is not visualized. No evidence of pulmonic stenosis. Aorta: The aortic root is normal in size and structure. Pulmonary Artery: Indeterminant PASP, inadequate TR  jet. IAS/Shunts: No atrial level shunt detected by color flow Doppler.  LEFT VENTRICLE PLAX 2D LVIDd:         4.30 cm  Diastology LVIDs:         2.48 cm  LV e' medial:   3.92 cm/s LV PW:         1.12 cm  LV E/e' medial: 20.3 LV IVS:        1.07 cm LVOT diam:     2.10 cm LV SV:         52 LV SV Index:   27 LVOT Area:     3.46 cm  RIGHT VENTRICLE RV Basal diam:  2.92 cm RV S prime:     8.49 cm/s TAPSE (M-mode): 1.9 cm LEFT ATRIUM             Index       RIGHT ATRIUM           Index LA diam:        3.70 cm 1.91 cm/m  RA Area:     21.60 cm LA Vol (A2C):   62.4 ml 32.26 ml/m RA Volume:   68.30 ml  35.31 ml/m LA Vol (A4C):   68.6 ml 35.47 ml/m LA Biplane Vol: 66.2 ml 34.22 ml/m  AORTIC VALVE AV Area (Vmax):    1.69 cm AV Area (Vmean):   1.69 cm AV Area (VTI):     1.73 cm AV Vmax:           166.96 cm/s AV Vmean:          100.510 cm/s AV VTI:            0.297 m AV Peak Grad:      11.1 mmHg AV Mean Grad:      5.0 mmHg LVOT Vmax:         81.41  cm/s LVOT Vmean:        49.131 cm/s LVOT VTI:          0.149 m LVOT/AV VTI ratio: 0.50  AORTA Ao Root diam: 2.60 cm MITRAL VALVE MV Area (PHT): 4.04 cm    SHUNTS MV Decel Time: 188 msec    Systemic VTI:  0.15 m MV E velocity: 79.70 cm/s  Systemic Diam: 2.10 cm MV A velocity: 26.10 cm/s MV E/A ratio:  3.05 MV A Prime:    6.6 cm/s Dina Rich MD Electronically signed by Dina Rich MD Signature Date/Time: 07/25/2020/11:33:13 AM    Final     Micro Results  Recent Results (from the past 240 hour(s))  Resp Panel by RT-PCR (Flu A&B, Covid) Nasopharyngeal Swab     Status: None   Collection Time: 07/24/20  2:59 PM   Specimen: Nasopharyngeal Swab; Nasopharyngeal(NP) swabs in vial transport medium  Result Value Ref Range Status   SARS Coronavirus 2 by RT PCR NEGATIVE NEGATIVE Final    Comment: (NOTE) SARS-CoV-2 target nucleic acids are NOT DETECTED.  The SARS-CoV-2 RNA is generally detectable in upper respiratory specimens during the acute phase of infection. The lowest concentration of SARS-CoV-2 viral copies this assay can detect is 138 copies/mL. A negative result does not preclude SARS-Cov-2 infection and should not be used as the sole basis for treatment or other patient management decisions. A negative result may occur with  improper specimen collection/handling, submission of specimen other than nasopharyngeal swab, presence of viral mutation(s) within the areas targeted by this assay, and inadequate number of viral copies(<138 copies/mL). A negative result must be  combined with clinical observations, patient history, and epidemiological information. The expected result is Negative.  Fact Sheet for Patients:  BloggerCourse.com  Fact Sheet for Healthcare Providers:  SeriousBroker.it  This test is no t yet approved or cleared by the [place] FDA and  has been authorized for detection and/or diagnosis of SARS-CoV-2 by FDA  under an Emergency Use Authorization (EUA). This EUA will remain  in effect (meaning this test can be used) for the duration of the COVID-19 declaration under Section 564(b)(1) of the Act, 21 U.S.C.section 360bbb-3(b)(1), unless the authorization is terminated  or revoked sooner.       Influenza A by PCR NEGATIVE NEGATIVE Final   Influenza B by PCR NEGATIVE NEGATIVE Final    Comment: (NOTE) The Xpert Xpress SARS-CoV-2/FLU/RSV plus assay is intended as an aid in the diagnosis of influenza from Nasopharyngeal swab specimens and should not be used as a sole basis for treatment. Nasal washings and aspirates are unacceptable for Xpert Xpress SARS-CoV-2/FLU/RSV testing.  Fact Sheet for Patients: BloggerCourse.com  Fact Sheet for Healthcare Providers: SeriousBroker.it  This test is not yet approved or cleared by the [place] FDA and has been authorized for detection and/or diagnosis of SARS-CoV-2 by FDA under an Emergency Use Authorization (EUA). This EUA will remain in effect (meaning this test can be used) for the duration of the COVID-19 declaration under Section 564(b)(1) of the Act, 21 U.S.C. section 360bbb-3(b)(1), unless the authorization is terminated or revoked.  Performed at [facility], [address] [place], [place] [address]   Culture, blood (Routine X 2) w Reflex to ID Panel     Status: None (Preliminary result)   Collection Time: 07/24/20  9:23 PM   Specimen: BLOOD LEFT WRIST  Result Value Ref Range Status   Specimen Description BLOOD LEFT WRIST  Final   Special Requests   Final    BOTTLES DRAWN AEROBIC AND ANAEROBIC Blood Culture results may not be optimal due to an inadequate volume of blood received in culture bottles   Culture   Final    NO GROWTH 3 DAYS Performed at [facility], [address], [place], [place] [address]    Report Status PENDING  Incomplete  Culture, blood (Routine X 2) w Reflex to ID  Panel     Status: None (Preliminary result)   Collection Time: 07/24/20  9:29 PM   Specimen: BLOOD LEFT HAND  Result Value Ref Range Status   Specimen Description BLOOD LEFT HAND  Final   Special Requests   Final    BOTTLES DRAWN AEROBIC ONLY Blood Culture adequate volume   Culture   Final    NO GROWTH 3 DAYS Performed at [facility], [address] [place], [place] [address]    Report Status PENDING  Incomplete       Today   Subjective    Carrie Best today has no new complaints, -Daughter-in-law at bedside, -Patient reports significant improvement in dyspnea, oxygen requirement is much better, patient is voiding very well          Patient has been seen and examined prior to discharge   Objective   Blood pressure (!) 155/53, pulse 66, temperature 97.7 F (36.5 C), temperature source Oral, resp. rate 16, height 5\' 8"  (1.727 m), weight 78.5 kg, SpO2 99 %.   Intake/Output Summary (Last 24 hours) at 07/27/2020 1450 Last data filed at 07/27/2020 0500 Gross per 24 hour  Intake 720 ml  Output 1450 ml  Net -730 ml  Exam Gen:- Awake Alert,  In no apparent distress  HEENT:- [place].AT, No sclera icterus Nose- [place] 2L/min Neck-Supple Neck,No JVD,.  Lungs-improved air movement, no wheezing CV- S1, S2 normal, regular  Abd-  +ve B.Sounds, Abd Soft, No tenderness,    Extremity/Skin:- improved lower extremity edema, pedal pulses present  Psych-affect is appropriate, oriented x3 Neuro-generalized weakness, ambulatory dysfunction, chronic neuro-muscular deficits, no additional new focal deficits, no tremors   Data Review   CBC w Diff:  Lab Results  Component Value Date   WBC 12.1 (H) 07/27/2020   HGB 12.7 07/27/2020   HGB 11.7 03/04/2020   HCT 39.8 07/27/2020   HCT 36.3 03/04/2020   PLT 231 07/27/2020   PLT 200 03/04/2020   LYMPHOPCT 11 07/24/2020   MONOPCT 7 07/24/2020   EOSPCT 1 07/24/2020   BASOPCT 1 07/24/2020    CMP:  Lab Results  Component Value Date   NA  137 07/27/2020   NA 136 03/04/2020   K 3.4 (L) 07/27/2020   CL 93 (L) 07/27/2020   CO2 36 (H) 07/27/2020   BUN 21 07/27/2020   BUN 19 03/04/2020   CREATININE 1.01 (H) 07/27/2020   PROT 6.4 (L) 07/25/2020   ALBUMIN 3.3 (L) 07/25/2020   BILITOT 1.2 07/25/2020   ALKPHOS 100 07/25/2020   AST 17 07/25/2020   ALT 11 07/25/2020  . Total Discharge time is about 33 minutes  Shon Hale M.D on 07/27/2020 at 2:50 PM  Go to [email or web address] -  for contact info  [facility] - Office  [phone]

## 2020-07-29 LAB — CULTURE, BLOOD (ROUTINE X 2)
Culture: NO GROWTH
Culture: NO GROWTH
Special Requests: ADEQUATE

## 2020-08-04 ENCOUNTER — Ambulatory Visit (INDEPENDENT_AMBULATORY_CARE_PROVIDER_SITE_OTHER): Payer: Medicare Other

## 2020-08-04 ENCOUNTER — Ambulatory Visit: Payer: Medicare Other | Admitting: Pulmonary Disease

## 2020-08-04 ENCOUNTER — Encounter: Payer: Self-pay | Admitting: Pulmonary Disease

## 2020-08-04 ENCOUNTER — Other Ambulatory Visit: Payer: Self-pay

## 2020-08-04 VITALS — BP 116/62 | HR 69 | Temp 97.7°F | Ht 68.0 in | Wt 173.6 lb

## 2020-08-04 DIAGNOSIS — G4734 Idiopathic sleep related nonobstructive alveolar hypoventilation: Secondary | ICD-10-CM

## 2020-08-04 DIAGNOSIS — G4733 Obstructive sleep apnea (adult) (pediatric): Secondary | ICD-10-CM | POA: Diagnosis not present

## 2020-08-04 DIAGNOSIS — J189 Pneumonia, unspecified organism: Secondary | ICD-10-CM | POA: Diagnosis not present

## 2020-08-04 NOTE — Progress Notes (Signed)
Carrie Best    [id number]    August 10, 1928  Primary Care Physician:Griffin, Jonny Ruiz, MD  Referring Physician: Kirby Funk, MD [address] [place],  [place] [address]  Chief complaint:   Patient with shortness of breath on exertion She is in for follow-up today  HPI: Was recently hospitalized for shortness of breath Since released from the hospital has been feeling much better  Unfortunately, has not been using oxygen at night  She did have a home sleep study done which showed oxygen desaturations and mild obstructive sleep apnea Upon further discussion, patient will not be able to sleep in the lab I did recommend oxygen supplementation  When patient has tried oxygen supplementation recently, ends up taking it off during the night, her caregiver-daughter-in-law usually will help set it up again  I do believe CPAP treatment will not be helpful, will probably cause significant disruption with her sleep than help  She is wheelchair-bound most times but when she is transferring or doing any activity at home she does get short of breath  No underlying lung disease known  History of significant snoring, no witnessed apneas Son has obstructive sleep apnea  She is noted to have pulmonary hypertension on echocardiogram  Never smoker, some exposure to secondhand smoke No occupational history  She is deconditioned from having chronic knee pain  She has a history of atrial fibrillation, history of congestive heart failure-diastolic heart failure   Outpatient Encounter Medications as of 08/04/2020  Medication Sig  . apixaban (ELIQUIS) 5 MG TABS tablet Take 1 tablet (5 mg total) by mouth 2 (two) times daily.  Marland Kitchen atorvastatin (LIPITOR) 40 MG tablet Take 1 tablet (40 mg total) by mouth daily.  Marland Kitchen diltiazem (CARTIA XT) 180 MG 24 hr capsule Take 180 mg by mouth daily.  . DULoxetine (CYMBALTA) 60 MG capsule Take 60 mg by mouth daily.   . hydrALAZINE  (APRESOLINE) 10 MG tablet Take 1 tablet (10 mg total) by mouth 2 (two) times daily.  Marland Kitchen HYDROcodone-acetaminophen (NORCO/VICODIN) 5-325 MG tablet Take 1 tablet by mouth every 12 (twelve) hours as needed for moderate pain or severe pain.   . isosorbide mononitrate (IMDUR) 30 MG 24 hr tablet TAKE 1 TABLET(30 MG) BY MOUTH DAILY  . LORazepam (ATIVAN) 0.5 MG tablet Take 0.5 mg by mouth 3 (three) times daily as needed.  . metoprolol tartrate (LOPRESSOR) 50 MG tablet Take 50 mg by mouth 2 (two) times daily.  . polyethylene glycol (MIRALAX / GLYCOLAX) 17 g packet Take 17 g by mouth daily.  . Potassium Chloride ER 20 MEQ TBCR Take 1 tablet by mouth daily.  Marland Kitchen torsemide (DEMADEX) 20 MG tablet Take 1 tablet (20 mg total) by mouth See admin instructions. Take Torsemide/Demadex 2 Tablets (40 mg) on Tuesday, Thursday, Saturday and Sunday, Take 1 Tab (20 mg) on Monday, Wednesday and Friday---  . zolpidem (AMBIEN) 10 MG tablet Take 10 mg by mouth at bedtime.   No facility-administered encounter medications on file as of 08/04/2020.    Allergies as of 08/04/2020 - Review Complete 08/04/2020  Allergen Reaction Noted  . Codeine Nausea And Vomiting 03/18/2012  . Penicillins Hives 03/18/2012  . Sulfur Nausea And Vomiting 03/18/2012    Past Medical History:  Diagnosis Date  . Anxiety   . Arthritis   . Congestive heart failure (HCC)    Congestive heart failure symptoms  . Depression   . Diastolic dysfunction   . Dyspnea  occasionally, with exertion  . Dysrhythmia 05-10-12   hx. A. Fib., rate is controlled-tx. Xarelto  . GERD (gastroesophageal reflux disease) 05-10-12   tx. with meds as needed  . Hearing loss 05-10-12   bilateral hearing aids.  . Heart murmur   . History of kidney stones   . Hyperlipidemia   . Hypertension   . Pneumonia 05-10-12   dx. in July- tx. antibiotics  . Stroke (HCC) 05-10-12   Lt.CVA note on scans-hx. freq. falls/ none past 6 months 01/21/14  . Wears hearing aid    bilateral     Past Surgical History:  Procedure Laterality Date  . APPENDECTOMY    . CATARACT EXTRACTION, BILATERAL  05-10-12   bilateral  . CYSTOSCOPY WITH RETROGRADE PYELOGRAM, URETEROSCOPY AND STENT PLACEMENT Right 01/31/2014   Procedure: CYSTOSCOPY WITH bilateral  RETROGRADE PYELOGRAM, right URETEROSCOPY AND right  STENT PLACEMENT, bladder biopsy with fulgeration;  Surgeon: Sebastian Ache, MD;  Location: [facility];  Service: Urology;  Laterality: Right;  . HARDWARE REMOVAL Right 09/09/2014   Procedure: HARDWARE REMOVAL RIGHT SHOULDER;  Surgeon: Mable Paris, MD;  Location: [facility];  Service: Orthopedics;  Laterality: Right;  Right shoulder hardware removal  . HOLMIUM LASER APPLICATION Right 01/31/2014   Procedure: HOLMIUM LASER APPLICATION;  Surgeon: Sebastian Ache, MD;  Location: [facility];  Service: Urology;  Laterality: Right;  . HUMERUS IM NAIL Right 06/10/2014   Procedure: INTRAMEDULLARY (IM) NAIL HUMERAL;  Surgeon: Mable Paris, MD;  Location: [facility];  Service: Orthopedics;  Laterality: Right;  Right intramedullary humeral nail  . INTERSTIM IMPLANT PLACEMENT  05-10-12   now malfunctioning  . INTERSTIM IMPLANT REMOVAL  05/16/2012   Procedure: REMOVAL OF INTERSTIM IMPLANT;  Surgeon: Martina Sinner, MD;  Location: [facility];  Service: Urology;  Laterality: N/A;  . JOINT REPLACEMENT    . OTHER SURGICAL HISTORY     Bladder Surgery  . SHOULDER SURGERY    . TOTAL KNEE ARTHROPLASTY Right 02/17/2015   Procedure: TOTAL KNEE ARTHROPLASTY;  Surgeon: Gean Birchwood, MD;  Location: [facility];  Service: Orthopedics;  Laterality: Right;    Family History  Problem Relation Age of Onset  . Stroke Mother   . Stroke Brother     Social History   Socioeconomic History  . Marital status: Widowed    Spouse name: Not on file  . Number of children: Not on file  . Years of education: Not on file  . Highest education level: Not on file  Occupational History  . Not on file  Tobacco Use   . Smoking status: Never Smoker  . Smokeless tobacco: Never Used  Vaping Use  . Vaping Use: Never used  Substance and Sexual Activity  . Alcohol use: No  . Drug use: No  . Sexual activity: Never  Other Topics Concern  . Not on file  Social History Narrative  . Not on file   Social Determinants of Health   Financial Resource Strain:   . Difficulty of Paying Living Expenses: Not on file  Food Insecurity:   . Worried About Programme researcher, broadcasting/film/video in the Last Year: Not on file  . Ran Out of Food in the Last Year: Not on file  Transportation Needs:   . Lack of Transportation (Medical): Not on file  . Lack of Transportation (Non-Medical): Not on file  Physical Activity:   . Days of Exercise per Week: Not on file  . Minutes of Exercise per Session: Not on file  Stress:   . Feeling of Stress : Not on file  Social Connections:   . Frequency of Communication with Friends and Family: Not on file  . Frequency of Social Gatherings with Friends and Family: Not on file  . Attends Religious Services: Not on file  . Active Member of Clubs or Organizations: Not on file  . Attends Banker Meetings: Not on file  . Marital Status: Not on file  Intimate Partner Violence:   . Fear of Current or Ex-Partner: Not on file  . Emotionally Abused: Not on file  . Physically Abused: Not on file  . Sexually Abused: Not on file    Review of Systems  Constitutional: Positive for fatigue.  Respiratory: Positive for shortness of breath. Negative for cough.   Psychiatric/Behavioral: Positive for sleep disturbance.    There were no vitals filed for this visit.   Physical Exam Constitutional:      Appearance: Normal appearance.  HENT:     Head: Normocephalic and atraumatic.  Cardiovascular:     Rate and Rhythm: Regular rhythm.     Pulses: Normal pulses.     Heart sounds: Normal heart sounds. No murmur heard.  No friction rub. No gallop.   Pulmonary:     Effort: Pulmonary effort is  normal. No respiratory distress.     Breath sounds: Normal breath sounds. No stridor. No wheezing or rhonchi.  Musculoskeletal:     Cervical back: No rigidity or tenderness.  Neurological:     Mental Status: She is alert.  Psychiatric:        Mood and Affect: Mood normal.    Data Reviewed: Echocardiogram with moderate pulmonary hypertension  Patients sleep study reviewed prior to visit  Assessment:  Moderate pulmonary hypertension -Likely multifactorial -Nocturnal desaturations may be contributing hence, oxygen supplementation recommendation  Significant snoring -Diagnosed with mild obstructive sleep apnea  No underlying lung disease by history  Plan/Recommendations:  I am recommending continuing use of oxygen at night  Will not benefit from CPAP therapy, likely will cause more disruption to patient's sleep than help her  Encouraged to stay as active as possible, she is restricted to wheelchair  I will follow-up in 3 months  Virl Diamond MD [facility] 08/04/2020, 2:28 PM  CC: Kirby Funk, MD  Home

## 2020-08-04 NOTE — Patient Instructions (Signed)
I will encourage her to continue using oxygen supplementation at night  CPAP therapy will not be helpful as it may disrupt her sleep much more than help  Call with significant concerns  I will see you back in about 3 months

## 2020-08-04 NOTE — Progress Notes (Signed)
You saw her today, looks better

## 2020-08-05 ENCOUNTER — Encounter: Payer: Self-pay | Admitting: Pulmonary Disease

## 2020-08-06 ENCOUNTER — Telehealth: Payer: Self-pay | Admitting: Pulmonary Disease

## 2020-08-07 NOTE — Telephone Encounter (Signed)
Called and let Carrie Best know Dr. Trena Platt order  Nothing further needed at this time

## 2020-08-07 NOTE — Telephone Encounter (Signed)
AO please advise if ok to give VO for OT/PT for this pt.  Last seen by AO on 08/04/2020

## 2020-08-07 NOTE — Telephone Encounter (Signed)
YES  I am okay with that

## 2020-08-14 ENCOUNTER — Telehealth: Payer: Self-pay | Admitting: Pulmonary Disease

## 2020-08-14 NOTE — Telephone Encounter (Signed)
Call and inform patient   Oximetry reviewed, Adequate oxygen levels maintained on oxygen supplementation  Continue with oxygen supplementation

## 2020-08-14 NOTE — Telephone Encounter (Signed)
Called and spoke with Patient's daughter in [place]  Dr. Trena Platt results and recommendations given.  Understanding stated.  Nothing further at this time.

## 2020-08-14 NOTE — Telephone Encounter (Signed)
08/14/20  Attempted to contact patient.  Left voicemail.  We will leave in triage for 1 more attempt on 08/15/2020 per protocol.  If 1 more attempt is made would recommend sending to normal results full as oxygen test was normal.  And this will prompt a documented letter to be mailed per our policy.  Elisha Headland, FNP

## 2020-09-09 DIAGNOSIS — J449 Chronic obstructive pulmonary disease, unspecified: Secondary | ICD-10-CM | POA: Diagnosis not present

## 2020-10-10 DIAGNOSIS — J449 Chronic obstructive pulmonary disease, unspecified: Secondary | ICD-10-CM | POA: Diagnosis not present

## 2020-10-15 DIAGNOSIS — M179 Osteoarthritis of knee, unspecified: Secondary | ICD-10-CM | POA: Diagnosis not present

## 2020-10-15 DIAGNOSIS — I5032 Chronic diastolic (congestive) heart failure: Secondary | ICD-10-CM | POA: Diagnosis not present

## 2020-10-15 DIAGNOSIS — I4891 Unspecified atrial fibrillation: Secondary | ICD-10-CM | POA: Diagnosis not present

## 2020-10-15 DIAGNOSIS — I10 Essential (primary) hypertension: Secondary | ICD-10-CM | POA: Diagnosis not present

## 2020-10-15 DIAGNOSIS — E7800 Pure hypercholesterolemia, unspecified: Secondary | ICD-10-CM | POA: Diagnosis not present

## 2020-10-15 DIAGNOSIS — M170 Bilateral primary osteoarthritis of knee: Secondary | ICD-10-CM | POA: Diagnosis not present

## 2020-10-21 ENCOUNTER — Ambulatory Visit: Payer: Self-pay

## 2020-10-21 ENCOUNTER — Encounter: Payer: Self-pay | Admitting: Orthopaedic Surgery

## 2020-10-21 ENCOUNTER — Ambulatory Visit: Payer: Medicare Other | Admitting: Orthopaedic Surgery

## 2020-10-21 DIAGNOSIS — M25561 Pain in right knee: Secondary | ICD-10-CM | POA: Diagnosis not present

## 2020-10-21 DIAGNOSIS — G8929 Other chronic pain: Secondary | ICD-10-CM

## 2020-10-21 DIAGNOSIS — M545 Low back pain, unspecified: Secondary | ICD-10-CM | POA: Diagnosis not present

## 2020-10-21 MED ORDER — HYDROCODONE-ACETAMINOPHEN 5-325 MG PO TABS: 1.0000 | ORAL_TABLET | Freq: Two times a day (BID) | ORAL | 0 refills | Status: DC | PRN

## 2020-10-21 MED ORDER — PREDNISONE 5 MG (21) PO TBPK: ORAL_TABLET | ORAL | 0 refills | Status: DC

## 2020-10-21 NOTE — Progress Notes (Signed)
Office Visit Note   Patient: Carrie Best           Date of Birth: 01/06/1928           MRN: [id number] Visit Date: 10/21/2020              Requested by: Kirby Funk, MD [address] [place],  [place] [address] PCP: Kirby Funk, MD   Assessment & Plan: Visit Diagnoses:  1. Low back pain, unspecified back pain laterality, unspecified chronicity, unspecified whether sciatica present   2. Chronic pain of right knee     Plan: Impression is chronic right lower back pain and chronic right knee patella tendon rupture.  In regards to the lower back pain, started on a steroid taper.  We have also discussed home health physical therapy which will try to get approved.  If this is not approved, she will let us know we will start her in some outpatient physical therapy.  She will follow up with [place] as needed.  In regards to her knee, unfortunately, there is nothing to surgically repair at this point.  Follow-Up Instructions: Return if symptoms worsen or fail to improve.   Orders:  Orders Placed This Encounter  Procedures  . XR KNEE 3 VIEW RIGHT  . XR Lumbar Spine 2-3 Views   Meds ordered this encounter  Medications  . predniSONE (STERAPRED UNI-PAK 21 TAB) 5 MG (21) TBPK tablet    Sig: Take as directed    Dispense:  21 tablet    Refill:  0  . HYDROcodone-acetaminophen (NORCO) 5-325 MG tablet    Sig: Take 1 tablet by mouth 2 (two) times daily as needed.    Dispense:  10 tablet    Refill:  0      Procedures: No procedures performed   Clinical Data: No additional findings.   Subjective: Chief Complaint  Patient presents with  . Lower Back - Pain  . Right Knee - Pain    HPI patient is a pleasant 87 year old female who comes in today with right lower back pain.  She has had this for about a month.  No new injury or change in activity.  Pain she has does radiate down the back of her right leg.  No paresthesias.  She has increased pain with transfers as well  as when she is lying on the bed.  She takes Tylenol and Norco which does not significantly help her symptoms.  She also notes pain and dysfunction to her right knee following knee replacement surgery by Dr. Turner Daniels a few years back.  She notes that she sustained a patella tendon rupture soon after her knee replacement which deemed her nonambulatory.  She now ambulates in a wheelchair.  Review of Systems as detailed in HPI.  All others reviewed and are negative.   Objective: Vital Signs: There were no vitals taken for this visit.  Physical Exam well-developed well-nourished female no acute distress.  Alert oriented x3.  Ortho Exam lumbar spine exam shows slight right-sided paraspinous tenderness to the lower lumbar region.  No pain with lumbar flexion or extension.  Negative straight leg raise.  She is neurovascular intact distally.  Right knee exam shows flexion contracture from 10 to 45 degrees.  Specialty Comments:  No specialty comments available.  Imaging: XR KNEE 3 VIEW RIGHT  Result Date: 10/21/2020 X-rays reveal patella alta.  Otherwise, well-seated prosthesis without complication  XR Lumbar Spine 2-3 Views  Result Date: 10/21/2020 Multilevel spondylosis.  Otherwise, no acute fracture    PMFS History: Patient Active Problem List   Diagnosis Date Noted  . Acute on chronic heart failure with preserved ejection fraction (HFpEF) (HCC) 07/25/2020  . Acute exacerbation of CHF (congestive heart failure) (HCC) 07/24/2020  . Constipation 07/24/2020  . Leukocytosis 07/24/2020  . Elevated brain natriuretic peptide (BNP) level 07/24/2020  . CAP (community acquired pneumonia) 07/24/2020  . OSA (obstructive sleep apnea) 07/10/2020  . Nocturnal hypoxia 07/10/2020  . Acute confusion 07/10/2020  . CVA (cerebral vascular accident) (HCC) 06/19/2019  . Acute encephalopathy 06/18/2019  . Acute respiratory failure with hypoxia (HCC) 09/10/2018  . Chronic kidney disease (CKD), stage II (mild)  09/10/2018  . Amyloidosis of bladder (HCC) 09/10/2018  . CHF exacerbation (HCC) 09/09/2018  . ARF (acute renal failure) (HCC) 07/10/2018  . Nausea & vomiting 07/09/2018  . Hypokalemia 12/25/2017  . Acute on chronic diastolic CHF (congestive heart failure) (HCC) 12/24/2017  . 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 Rupture of right patellar tendon 05/19/2016  . H/O nonunion of fracture 03/16/2016  . Arthritis of knee 02/17/2015  . Primary osteoarthritis of right knee Valgus 02/14/2015  . Chronic diastolic CHF (congestive heart failure) (HCC) 02/06/2015  . Proximal humerus fracture 06/10/2014  . Obesity 03/18/2012  . HTN (hypertension) 03/18/2012  . GERD (gastroesophageal reflux disease) 03/18/2012  . Hyperlipidemia with target LDL less than 70 03/18/2012  . Sleep disturbance 03/18/2012  . Atrial fibrillation (HCC) 03/18/2012   Past Medical History:  Diagnosis Date  . Anxiety   . Arthritis   . Congestive heart failure (HCC)    Congestive heart failure symptoms  . Depression   . Diastolic dysfunction   . Dyspnea    occasionally, with exertion  . Dysrhythmia 05-10-12   hx. A. Fib., rate is controlled-tx. Xarelto  . GERD (gastroesophageal reflux disease) 05-10-12   tx. with meds as needed  . Hearing loss 05-10-12   bilateral hearing aids.  . Heart murmur   . History of kidney stones   . Hyperlipidemia   . Hypertension   . Pneumonia 05-10-12   dx. in July- tx. antibiotics  . Stroke (HCC) 05-10-12   Lt.CVA note on scans-hx. freq. falls/ none past 6 months 01/21/14  . Wears hearing aid    bilateral    Family History  Problem Relation Age of Onset  . Stroke Mother   . Stroke Brother     Past Surgical History:  Procedure Laterality Date  . APPENDECTOMY    . CATARACT EXTRACTION, BILATERAL  05-10-12   bilateral  . CYSTOSCOPY WITH RETROGRADE PYELOGRAM, URETEROSCOPY AND STENT PLACEMENT Right 01/31/2014   Procedure: CYSTOSCOPY WITH bilateral  RETROGRADE PYELOGRAM, right  URETEROSCOPY AND right  STENT PLACEMENT, bladder biopsy with fulgeration;  Surgeon: Sebastian Ache, MD;  Location: [facility];  Service: Urology;  Laterality: Right;  . HARDWARE REMOVAL Right 09/09/2014   Procedure: HARDWARE REMOVAL RIGHT SHOULDER;  Surgeon: Mable Paris, MD;  [facility];  Service: Orthopedics;  Laterality: Right;  Right shoulder hardware removal  . HOLMIUM LASER APPLICATION Right 01/31/2014   Procedure: HOLMIUM LASER APPLICATION;  Surgeon: Sebastian Ache, MD;  Location: [facility];  Service: Urology;  Laterality: Right;  . HUMERUS IM NAIL Right 06/10/2014   Procedure: INTRAMEDULLARY (IM) NAIL HUMERAL;  Surgeon: Mable Paris, MD;  [facility];  Service: Orthopedics;  Laterality: Right;  Right intramedullary humeral nail  . INTERSTIM IMPLANT PLACEMENT  05-10-12   now malfunctioning  . Leane Platt  IMPLANT REMOVAL  05/16/2012   Procedure: REMOVAL OF INTERSTIM IMPLANT;  Surgeon: Martina Sinner, MD;  Location: [facility];  Service: Urology;  Laterality: N/A;  . JOINT REPLACEMENT    . OTHER SURGICAL HISTORY     Bladder Surgery  . SHOULDER SURGERY    . TOTAL KNEE ARTHROPLASTY Right 02/17/2015   Procedure: TOTAL KNEE ARTHROPLASTY;  Surgeon: Gean Birchwood, MD;  Location: [facility];  Service: Orthopedics;  Laterality: Right;   Social History   Occupational History  . Not on file  Tobacco Use  . Smoking status: Never Smoker  . Smokeless tobacco: Never Used  Vaping Use  . Vaping Use: Never used  Substance and Sexual Activity  . Alcohol use: No  . Drug use: No  . Sexual activity: Never

## 2020-11-07 DIAGNOSIS — J449 Chronic obstructive pulmonary disease, unspecified: Secondary | ICD-10-CM | POA: Diagnosis not present

## 2020-11-20 DIAGNOSIS — M25551 Pain in right hip: Secondary | ICD-10-CM | POA: Diagnosis not present

## 2020-11-20 DIAGNOSIS — M545 Low back pain, unspecified: Secondary | ICD-10-CM | POA: Diagnosis not present

## 2020-12-01 DIAGNOSIS — M170 Bilateral primary osteoarthritis of knee: Secondary | ICD-10-CM | POA: Diagnosis not present

## 2020-12-01 DIAGNOSIS — I4891 Unspecified atrial fibrillation: Secondary | ICD-10-CM | POA: Diagnosis not present

## 2020-12-01 DIAGNOSIS — M179 Osteoarthritis of knee, unspecified: Secondary | ICD-10-CM | POA: Diagnosis not present

## 2020-12-01 DIAGNOSIS — I5032 Chronic diastolic (congestive) heart failure: Secondary | ICD-10-CM | POA: Diagnosis not present

## 2020-12-01 DIAGNOSIS — I10 Essential (primary) hypertension: Secondary | ICD-10-CM | POA: Diagnosis not present

## 2020-12-01 DIAGNOSIS — E7800 Pure hypercholesterolemia, unspecified: Secondary | ICD-10-CM | POA: Diagnosis not present

## 2020-12-08 DIAGNOSIS — J449 Chronic obstructive pulmonary disease, unspecified: Secondary | ICD-10-CM | POA: Diagnosis not present

## 2020-12-26 DIAGNOSIS — E7800 Pure hypercholesterolemia, unspecified: Secondary | ICD-10-CM | POA: Diagnosis not present

## 2020-12-26 DIAGNOSIS — I5032 Chronic diastolic (congestive) heart failure: Secondary | ICD-10-CM | POA: Diagnosis not present

## 2020-12-26 DIAGNOSIS — I10 Essential (primary) hypertension: Secondary | ICD-10-CM | POA: Diagnosis not present

## 2020-12-26 DIAGNOSIS — Z1389 Encounter for screening for other disorder: Secondary | ICD-10-CM | POA: Diagnosis not present

## 2020-12-26 DIAGNOSIS — R7301 Impaired fasting glucose: Secondary | ICD-10-CM | POA: Diagnosis not present

## 2020-12-26 DIAGNOSIS — Z0000 Encounter for general adult medical examination without abnormal findings: Secondary | ICD-10-CM | POA: Diagnosis not present

## 2020-12-26 DIAGNOSIS — D6869 Other thrombophilia: Secondary | ICD-10-CM | POA: Diagnosis not present

## 2020-12-26 DIAGNOSIS — Z7189 Other specified counseling: Secondary | ICD-10-CM | POA: Diagnosis not present

## 2020-12-26 DIAGNOSIS — I4891 Unspecified atrial fibrillation: Secondary | ICD-10-CM | POA: Diagnosis not present

## 2021-01-07 DIAGNOSIS — J449 Chronic obstructive pulmonary disease, unspecified: Secondary | ICD-10-CM | POA: Diagnosis not present

## 2021-02-07 DIAGNOSIS — J449 Chronic obstructive pulmonary disease, unspecified: Secondary | ICD-10-CM | POA: Diagnosis not present

## 2021-02-17 DIAGNOSIS — I10 Essential (primary) hypertension: Secondary | ICD-10-CM | POA: Diagnosis not present

## 2021-02-17 DIAGNOSIS — I4891 Unspecified atrial fibrillation: Secondary | ICD-10-CM | POA: Diagnosis not present

## 2021-02-17 DIAGNOSIS — M179 Osteoarthritis of knee, unspecified: Secondary | ICD-10-CM | POA: Diagnosis not present

## 2021-02-17 DIAGNOSIS — I5032 Chronic diastolic (congestive) heart failure: Secondary | ICD-10-CM | POA: Diagnosis not present

## 2021-02-17 DIAGNOSIS — E7800 Pure hypercholesterolemia, unspecified: Secondary | ICD-10-CM | POA: Diagnosis not present

## 2021-02-17 DIAGNOSIS — M170 Bilateral primary osteoarthritis of knee: Secondary | ICD-10-CM | POA: Diagnosis not present

## 2021-03-09 DIAGNOSIS — J449 Chronic obstructive pulmonary disease, unspecified: Secondary | ICD-10-CM | POA: Diagnosis not present

## 2021-04-09 DIAGNOSIS — J449 Chronic obstructive pulmonary disease, unspecified: Secondary | ICD-10-CM | POA: Diagnosis not present

## 2021-05-05 DIAGNOSIS — M179 Osteoarthritis of knee, unspecified: Secondary | ICD-10-CM | POA: Diagnosis not present

## 2021-05-05 DIAGNOSIS — M170 Bilateral primary osteoarthritis of knee: Secondary | ICD-10-CM | POA: Diagnosis not present

## 2021-05-05 DIAGNOSIS — E7800 Pure hypercholesterolemia, unspecified: Secondary | ICD-10-CM | POA: Diagnosis not present

## 2021-05-05 DIAGNOSIS — I5032 Chronic diastolic (congestive) heart failure: Secondary | ICD-10-CM | POA: Diagnosis not present

## 2021-05-05 DIAGNOSIS — I4891 Unspecified atrial fibrillation: Secondary | ICD-10-CM | POA: Diagnosis not present

## 2021-05-05 DIAGNOSIS — I10 Essential (primary) hypertension: Secondary | ICD-10-CM | POA: Diagnosis not present

## 2021-05-10 DIAGNOSIS — J449 Chronic obstructive pulmonary disease, unspecified: Secondary | ICD-10-CM | POA: Diagnosis not present

## 2021-05-14 DIAGNOSIS — I10 Essential (primary) hypertension: Secondary | ICD-10-CM | POA: Diagnosis not present

## 2021-05-14 DIAGNOSIS — E7800 Pure hypercholesterolemia, unspecified: Secondary | ICD-10-CM | POA: Diagnosis not present

## 2021-05-14 DIAGNOSIS — I5032 Chronic diastolic (congestive) heart failure: Secondary | ICD-10-CM | POA: Diagnosis not present

## 2021-05-14 DIAGNOSIS — I4891 Unspecified atrial fibrillation: Secondary | ICD-10-CM | POA: Diagnosis not present

## 2021-05-14 DIAGNOSIS — M170 Bilateral primary osteoarthritis of knee: Secondary | ICD-10-CM | POA: Diagnosis not present

## 2021-06-03 ENCOUNTER — Other Ambulatory Visit: Payer: Self-pay

## 2021-06-03 ENCOUNTER — Ambulatory Visit: Payer: Medicare Other | Admitting: Pulmonary Disease

## 2021-06-03 ENCOUNTER — Encounter: Payer: Self-pay | Admitting: Pulmonary Disease

## 2021-06-03 VITALS — BP 134/60 | HR 66 | Temp 98.3°F | Ht 68.0 in | Wt 141.4 lb

## 2021-06-03 DIAGNOSIS — R0602 Shortness of breath: Secondary | ICD-10-CM | POA: Diagnosis not present

## 2021-06-03 DIAGNOSIS — J449 Chronic obstructive pulmonary disease, unspecified: Secondary | ICD-10-CM

## 2021-06-03 NOTE — Patient Instructions (Signed)
Portable oxygen concentrator prescription  At home rehabilitation therapy prescription  Graded exercises as tolerated  Continue oxygen supplementation  I will see you in about 3 months  Call with significant concerns

## 2021-06-03 NOTE — Progress Notes (Signed)
Carrie Best    [id number]    1928-07-23  Primary Care Physician:Griffin, Jonny Ruiz, MD  Referring Physician: Kirby Funk, MD [address] [place],  [place] [address]  Chief complaint:   In for follow-up for shortness of breath Accompanied by daughter in law and son  HPI: Shortness of breath at rest She gets short of breath with any kind of activity  Hospitalized recently for shortness of breath  She is on oxygen around-the-clock Not very active  Sleeps a lot during the day Sometimes not able to sleep at night  She did have a home sleep study done which showed oxygen desaturations and mild obstructive sleep apnea Upon further discussion, patient will not be able to sleep in the lab I did recommend oxygen supplementation  When patient has tried oxygen supplementation recently, ends up taking it off during the night, her caregiver-daughter-in-law usually will help set it up again  She does have obstructive sleep apnea, unable to use CPAP, on oxygen supplementation at night  She is wheelchair-bound most times but when she is transferring or doing any activity at home she does get short of breath  No underlying lung disease known  Son has obstructive sleep apnea  She is noted to have pulmonary hypertension on echocardiogram  Never smoker, some exposure to secondhand smoke No occupational history  She is deconditioned from having chronic knee pain  She has a history of atrial fibrillation, history of congestive heart failure-diastolic heart failure   Outpatient Encounter Medications as of 08/04/2020  Medication Sig   apixaban (ELIQUIS) 5 MG TABS tablet Take 1 tablet (5 mg total) by mouth 2 (two) times daily.   atorvastatin (LIPITOR) 40 MG tablet Take 1 tablet (40 mg total) by mouth daily.   diltiazem (CARTIA XT) 180 MG 24 hr capsule Take 180 mg by mouth daily.   DULoxetine (CYMBALTA) 60 MG capsule Take 60 mg by mouth daily.    hydrALAZINE  (APRESOLINE) 10 MG tablet Take 1 tablet (10 mg total) by mouth 2 (two) times daily.   HYDROcodone-acetaminophen (NORCO/VICODIN) 5-325 MG tablet Take 1 tablet by mouth every 12 (twelve) hours as needed for moderate pain or severe pain.    isosorbide mononitrate (IMDUR) 30 MG 24 hr tablet TAKE 1 TABLET(30 MG) BY MOUTH DAILY   LORazepam (ATIVAN) 0.5 MG tablet Take 0.5 mg by mouth 3 (three) times daily as needed.   metoprolol tartrate (LOPRESSOR) 50 MG tablet Take 50 mg by mouth 2 (two) times daily.   polyethylene glycol (MIRALAX / GLYCOLAX) 17 g packet Take 17 g by mouth daily.   Potassium Chloride ER 20 MEQ TBCR Take 1 tablet by mouth daily.   torsemide (DEMADEX) 20 MG tablet Take 1 tablet (20 mg total) by mouth See admin instructions. Take Torsemide/Demadex 2 Tablets (40 mg) on Tuesday, Thursday, Saturday and Sunday, Take 1 Tab (20 mg) on Monday, Wednesday and Friday---   zolpidem (AMBIEN) 10 MG tablet Take 10 mg by mouth at bedtime.   No facility-administered encounter medications on file as of 08/04/2020.    Allergies as of 08/04/2020 - Review Complete 08/04/2020  Allergen Reaction Noted   Codeine Nausea And Vomiting 03/18/2012   Penicillins Hives 03/18/2012   Sulfur Nausea And Vomiting 03/18/2012    Past Medical History:  Diagnosis Date   Anxiety    Arthritis    Congestive heart failure (HCC)    Congestive heart failure symptoms   Depression  Diastolic dysfunction    Dyspnea    occasionally, with exertion   Dysrhythmia 05-10-12   hx. A. Fib., rate is controlled-tx. Xarelto   GERD (gastroesophageal reflux disease) 05-10-12   tx. with meds as needed   Hearing loss 05-10-12   bilateral hearing aids.   Heart murmur    History of kidney stones    Hyperlipidemia    Hypertension    Pneumonia 05-10-12   dx. in July- tx. antibiotics   Stroke (HCC) 05-10-12   Lt.CVA note on scans-hx. freq. falls/ none past 6 months 01/21/14   Wears hearing aid    bilateral    Past Surgical History:   Procedure Laterality Date   APPENDECTOMY     CATARACT EXTRACTION, BILATERAL  05-10-12   bilateral   CYSTOSCOPY WITH RETROGRADE PYELOGRAM, URETEROSCOPY AND STENT PLACEMENT Right 01/31/2014   Procedure: CYSTOSCOPY WITH bilateral  RETROGRADE PYELOGRAM, right URETEROSCOPY AND right  STENT PLACEMENT, bladder biopsy with fulgeration;  Surgeon: Sebastian Ache, MD;  Location: [facility];  Service: Urology;  Laterality: Right;   HARDWARE REMOVAL Right 09/09/2014   Procedure: HARDWARE REMOVAL RIGHT SHOULDER;  Surgeon: Mable Paris, MD;  Location: [facility];  Service: Orthopedics;  Laterality: Right;  Right shoulder hardware removal   HOLMIUM LASER APPLICATION Right 01/31/2014   Procedure: HOLMIUM LASER APPLICATION;  Surgeon: Sebastian Ache, MD;  Location: [facility];  Service: Urology;  Laterality: Right;   HUMERUS IM NAIL Right 06/10/2014   Procedure: INTRAMEDULLARY (IM) NAIL HUMERAL;  Surgeon: Mable Paris, MD;  Location: [facility];  Service: Orthopedics;  Laterality: Right;  Right intramedullary humeral nail   INTERSTIM IMPLANT PLACEMENT  05-10-12   now malfunctioning   INTERSTIM IMPLANT REMOVAL  05/16/2012   Procedure: REMOVAL OF INTERSTIM IMPLANT;  Surgeon: Martina Sinner, MD;  Location: [facility];  Service: Urology;  Laterality: N/A;   JOINT REPLACEMENT     OTHER SURGICAL HISTORY     Bladder Surgery   SHOULDER SURGERY     TOTAL KNEE ARTHROPLASTY Right 02/17/2015   Procedure: TOTAL KNEE ARTHROPLASTY;  Surgeon: Gean Birchwood, MD;  Location: [facility];  Service: Orthopedics;  Laterality: Right;    Family History  Problem Relation Age of Onset   Stroke Mother    Stroke Brother     Social History   Socioeconomic History   Marital status: Widowed    Spouse name: Not on file   Number of children: Not on file   Years of education: Not on file   Highest education level: Not on file  Occupational History   Not on file  Tobacco Use   Smoking status: Never Smoker   Smokeless  tobacco: Never Used  Vaping Use   Vaping Use: Never used  Substance and Sexual Activity   Alcohol use: No   Drug use: No   Sexual activity: Never  Other Topics Concern   Not on file  Social History Narrative   Not on file   Social Determinants of Health   Financial Resource Strain:    Difficulty of Paying Living Expenses: Not on file  Food Insecurity:    Worried About Running Out of Food in the Last Year: Not on file   Ran Out of Food in the Last Year: Not on file  Transportation Needs:    Lack of Transportation (Medical): Not on file   Lack of Transportation (Non-Medical): Not on file  Physical Activity:    Days of Exercise per Week: Not on file  Minutes of Exercise per Session: Not on file  Stress:    Feeling of Stress : Not on file  Social Connections:    Frequency of Communication with Friends and Family: Not on file   Frequency of Social Gatherings with Friends and Family: Not on file   Attends Religious Services: Not on file   Active Member of Clubs or Organizations: Not on file   Attends Banker Meetings: Not on file   Marital Status: Not on file  Intimate Partner Violence:    Fear of Current or Ex-Partner: Not on file   Emotionally Abused: Not on file   Physically Abused: Not on file   Sexually Abused: Not on file    Review of Systems  Constitutional:  Positive for fatigue.  Respiratory:  Positive for shortness of breath. Negative for cough.   Psychiatric/Behavioral:  Positive for sleep disturbance.    There were no vitals filed for this visit.   Physical Exam Constitutional:      Appearance: Normal appearance.  HENT:     Head: Normocephalic and atraumatic.     Mouth/Throat:     Mouth: Mucous membranes are moist.  Cardiovascular:     Rate and Rhythm: Regular rhythm.     Pulses: Normal pulses.     Heart sounds: Normal heart sounds. No murmur heard.   No friction rub. No gallop.  Pulmonary:     Effort: Pulmonary effort is normal. No  respiratory distress.     Breath sounds: Normal breath sounds. No stridor. No wheezing or rhonchi.  Musculoskeletal:     Cervical back: No rigidity or tenderness.  Neurological:     Mental Status: She is alert.     Comments: Wheelchair borne  Psychiatric:        Mood and Affect: Mood normal.   Data Reviewed: Most recent echocardiogram did not show significant pulmonary hypertension  Previous sleep study reviewed  Assessment:  Dyspnea with activity  Significant deconditioning  Snoring  No underlying lung disease previously diagnosed  Nocturnal desaturations  Most of her discussions today was about how to get her more active, avoid daytime naps, encourage nighttime sleep Physical activity does not have to entail too strenuous activity. Profoundly deconditioned  Anxiety also likely playing a role in symptomatology, she does report that anxiolytic does help whenever she is short of breath  Plan/Recommendations:  Continue oxygen use at night  We will schedule the patient for in-home rehab/physical therapy  Continue oxygen segmentation  Graded activity as tolerated  Follow-up in 3 months  I spent 30 minutes dedicated to the care of this patient on the date of this encounter to include previsit review of records, face-to-face time with the patient discussing conditions above, post visit ordering of testing, clinical documentation with electronic health record and communicated necessary findings to members of the patient's care team  Virl Diamond MD [facility] 08/04/2020, 2:28 PM  CC: Kirby Funk, MD  Home

## 2021-06-05 ENCOUNTER — Emergency Department (HOSPITAL_COMMUNITY)
Admission: EM | Admit: 2021-06-05 | Discharge: 2021-06-06 | Disposition: A | Discharge status: Home / Self Care | Payer: Medicare Other | Attending: Emergency Medicine | Admitting: Emergency Medicine

## 2021-06-05 ENCOUNTER — Encounter (HOSPITAL_COMMUNITY): Payer: Self-pay | Admitting: Emergency Medicine

## 2021-06-05 ENCOUNTER — Telehealth: Payer: Self-pay | Admitting: Pulmonary Disease

## 2021-06-05 ENCOUNTER — Other Ambulatory Visit: Payer: Self-pay

## 2021-06-05 DIAGNOSIS — Z79899 Other long term (current) drug therapy: Secondary | ICD-10-CM | POA: Diagnosis not present

## 2021-06-05 DIAGNOSIS — Z9981 Dependence on supplemental oxygen: Secondary | ICD-10-CM | POA: Insufficient documentation

## 2021-06-05 DIAGNOSIS — I130 Hypertensive heart and chronic kidney disease with heart failure and stage 1 through stage 4 chronic kidney disease, or unspecified chronic kidney disease: Secondary | ICD-10-CM | POA: Insufficient documentation

## 2021-06-05 DIAGNOSIS — E1122 Type 2 diabetes mellitus with diabetic chronic kidney disease: Secondary | ICD-10-CM | POA: Insufficient documentation

## 2021-06-05 DIAGNOSIS — I4891 Unspecified atrial fibrillation: Secondary | ICD-10-CM | POA: Diagnosis not present

## 2021-06-05 DIAGNOSIS — I5041 Acute combined systolic (congestive) and diastolic (congestive) heart failure: Secondary | ICD-10-CM | POA: Insufficient documentation

## 2021-06-05 DIAGNOSIS — N182 Chronic kidney disease, stage 2 (mild): Secondary | ICD-10-CM | POA: Diagnosis not present

## 2021-06-05 DIAGNOSIS — J9611 Chronic respiratory failure with hypoxia: Secondary | ICD-10-CM

## 2021-06-05 DIAGNOSIS — Z743 Need for continuous supervision: Secondary | ICD-10-CM | POA: Diagnosis not present

## 2021-06-05 DIAGNOSIS — Z96651 Presence of right artificial knee joint: Secondary | ICD-10-CM | POA: Diagnosis not present

## 2021-06-05 DIAGNOSIS — R6889 Other general symptoms and signs: Secondary | ICD-10-CM | POA: Diagnosis not present

## 2021-06-05 NOTE — ED Triage Notes (Signed)
Pt brought in by [facility] because she has no power @ home (states probably won't be back on until Monday) and requires 2-3 L O2 at all times. Pt has no medical complaints, just needs a place to stay until power comes on.

## 2021-06-05 NOTE — Telephone Encounter (Signed)
Called and spoke with [facility] with [employer] and she stated that they were going to start the pt today with PT but the pt requested that they wait until Monday.  They needed an ok with AO for this.  Please advise. thanks

## 2021-06-05 NOTE — ED Provider Notes (Signed)
[facility] EMERGENCY DEPARTMENT Provider Note   CSN: [id number] Arrival date & time: 06/05/21  2041     History Chief Complaint  Patient presents with   Power Loss- O2 dependent    Carrie Best is a 87 y.o. female.  HPI    87 year old female comes in with chief complaint of power loss.  Patient has history of CHF and is on oxygen 2 to 3 L constantly.  She lives at her home with her kids.  The power went out, prompting them to call EMS before she decompensates.  She has no complaints at this time.   Past Medical History:  Diagnosis Date   Anxiety    Arthritis    Congestive heart failure (HCC)    Congestive heart failure symptoms   Depression    Diastolic dysfunction    Dyspnea    occasionally, with exertion   Dysrhythmia 05-10-12   hx. A. Fib., rate is controlled-tx. Xarelto   GERD (gastroesophageal reflux disease) 05-10-12   tx. with meds as needed   Hearing loss 05-10-12   bilateral hearing aids.   Heart murmur    History of kidney stones    Hyperlipidemia    Hypertension    Pneumonia 05-10-12   dx. in July- tx. antibiotics   Stroke (HCC) 05-10-12   Lt.CVA note on scans-hx. freq. falls/ none past 6 months 01/21/14   Wears hearing aid    bilateral    Patient Active Problem List   Diagnosis Date Noted   Acute on chronic heart failure with preserved ejection fraction (HFpEF) (HCC) 07/25/2020   Acute exacerbation of CHF (congestive heart failure) (HCC) 07/24/2020   Constipation 07/24/2020   Leukocytosis 07/24/2020   Elevated brain natriuretic peptide (BNP) level 07/24/2020   CAP (community acquired pneumonia) 07/24/2020   OSA (obstructive sleep apnea) 07/10/2020   Nocturnal hypoxia 07/10/2020   Acute confusion 07/10/2020   CVA (cerebral vascular accident) (HCC) 06/19/2019   Acute encephalopathy 06/18/2019   Acute respiratory failure with hypoxia (HCC) 09/10/2018   Chronic kidney disease (CKD), stage II (mild) 09/10/2018   Amyloidosis of bladder (HCC) 09/10/2018    CHF exacerbation (HCC) 09/09/2018   ARF (acute renal failure) (HCC) 07/10/2018   Nausea & vomiting 07/09/2018   Hypokalemia 12/25/2017   Acute on chronic diastolic CHF (congestive heart failure) (HCC) 12/24/2017   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/19/2016   Rupture of right patellar tendon 05/19/2016   H/O nonunion of fracture 03/16/2016   Arthritis of knee 02/17/2015   Primary osteoarthritis of right knee Valgus 02/14/2015   Chronic diastolic CHF (congestive heart failure) (HCC) 02/06/2015   Proximal humerus fracture 06/10/2014   Obesity 03/18/2012   HTN (hypertension) 03/18/2012   GERD (gastroesophageal reflux disease) 03/18/2012   Hyperlipidemia with target LDL less than 70 03/18/2012   Sleep disturbance 03/18/2012   Atrial fibrillation (HCC) 03/18/2012    Past Surgical History:  Procedure Laterality Date   APPENDECTOMY     CATARACT EXTRACTION, BILATERAL  05-10-12   bilateral   CYSTOSCOPY WITH RETROGRADE PYELOGRAM, URETEROSCOPY AND STENT PLACEMENT Right 01/31/2014   Procedure: CYSTOSCOPY WITH bilateral  RETROGRADE PYELOGRAM, right URETEROSCOPY AND right  STENT PLACEMENT, bladder biopsy with fulgeration;  Surgeon: Sebastian Ache, MD;  Location: [facility];  Service: Urology;  Laterality: Right;   HARDWARE REMOVAL Right 09/09/2014   Procedure: HARDWARE REMOVAL RIGHT SHOULDER;  Surgeon: Mable Paris, MD;  Location: [facility];  Service: Orthopedics;  Laterality: Right;  Right shoulder hardware removal  HOLMIUM LASER APPLICATION Right 01/31/2014   Procedure: HOLMIUM LASER APPLICATION;  Surgeon: Sebastian Ache, MD;  Location: [facility];  Service: Urology;  Laterality: Right;   HUMERUS IM NAIL Right 06/10/2014   Procedure: INTRAMEDULLARY (IM) NAIL HUMERAL;  Surgeon: Mable Paris, MD;  Location: [facility];  Service: Orthopedics;  Laterality: Right;  Right intramedullary humeral nail   INTERSTIM IMPLANT PLACEMENT  05-10-12   now malfunctioning   INTERSTIM  IMPLANT REMOVAL  05/16/2012   Procedure: REMOVAL OF INTERSTIM IMPLANT;  Surgeon: Martina Sinner, MD;  Location: [facility];  Service: Urology;  Laterality: N/A;   JOINT REPLACEMENT     OTHER SURGICAL HISTORY     Bladder Surgery   SHOULDER SURGERY     TOTAL KNEE ARTHROPLASTY Right 02/17/2015   Procedure: TOTAL KNEE ARTHROPLASTY;  Surgeon: Gean Birchwood, MD;  Location: [facility];  Service: Orthopedics;  Laterality: Right;     OB History   No obstetric history on file.     Family History  Problem Relation Age of Onset   Stroke Mother    Stroke Brother     Social History   Tobacco Use   Smoking status: Never   Smokeless tobacco: Never  Vaping Use   Vaping Use: Never used  Substance Use Topics   Alcohol use: No   Drug use: No    Home Medications Prior to Admission medications   Medication Sig Start Date End Date Taking? Authorizing Provider  apixaban (ELIQUIS) 5 MG TABS tablet Take 1 tablet (5 mg total) by mouth 2 (two) times daily. 07/27/20   Shon Hale, MD  atorvastatin (LIPITOR) 40 MG tablet Take 1 tablet (40 mg total) by mouth daily. 06/26/19   Darlin Drop, DO  diltiazem (CARDIZEM CD) 180 MG 24 hr capsule Take 180 mg by mouth daily.    [provider]  DULoxetine (CYMBALTA) 60 MG capsule Take 60 mg by mouth daily.     [provider]  hydrALAZINE (APRESOLINE) 10 MG tablet Take 1 tablet (10 mg total) by mouth 2 (two) times daily. 07/27/20 08/26/20  Shon Hale, MD  HYDROcodone-acetaminophen (NORCO) 5-325 MG tablet Take 1 tablet by mouth 2 (two) times daily as needed. 10/21/20   Cristie Hem, PA-C  isosorbide mononitrate (IMDUR) 30 MG 24 hr tablet TAKE 1 TABLET(30 MG) BY MOUTH DAILY 10/15/19   Nahser, Deloris Ping, MD  LORazepam (ATIVAN) 0.5 MG tablet Take 0.5 mg by mouth 3 (three) times daily as needed. 07/16/19   [provider]  metoprolol tartrate (LOPRESSOR) 50 MG tablet Take 50 mg by mouth 2 (two) times daily.    [provider]   polyethylene glycol (MIRALAX / GLYCOLAX) 17 g packet Take 17 g by mouth daily. 06/26/19   Darlin Drop, DO  Potassium Chloride ER 20 MEQ TBCR Take 1 tablet by mouth daily. 07/27/20   Shon Hale, MD  predniSONE (STERAPRED UNI-PAK 21 TAB) 5 MG (21) TBPK tablet Take as directed 10/21/20   Cristie Hem, PA-C  torsemide (DEMADEX) 20 MG tablet Take 1 tablet (20 mg total) by mouth See admin instructions. Take Torsemide/Demadex 2 Tablets (40 mg) on Tuesday, Thursday, Saturday and Sunday, Take 1 Tab (20 mg) on Monday, Wednesday and Friday--- 07/27/20   Shon Hale, MD  zolpidem (AMBIEN) 10 MG tablet Take 10 mg by mouth at bedtime.    [provider]    Allergies    Codeine, Elemental sulfur, and Penicillins  Review of Systems   Review  of Systems  Constitutional:  Negative for activity change.  Respiratory:  Negative for shortness of breath.    Physical Exam Updated Vital Signs BP (!) 134/55 (BP Location: Left Arm)   Pulse 66   Temp 97.8 F (36.6 C) (Oral)   Resp 17   Ht 5\' 8"  (1.727 m)   Wt 64 kg   SpO2 97%   BMI 21.45 kg/m   Physical Exam Vitals and nursing note reviewed.  Constitutional:      Appearance: She is well-developed.  HENT:     Head: Atraumatic.  Cardiovascular:     Rate and Rhythm: Normal rate.  Pulmonary:     Effort: Pulmonary effort is normal.  Musculoskeletal:     Cervical back: Normal range of motion and neck supple.  Skin:    General: Skin is warm and dry.  Neurological:     Mental Status: She is alert and oriented to person, place, and time.    ED Results / Procedures / Treatments   Labs (all labs ordered are listed, but only abnormal results are displayed) Labs Reviewed - No data to display  EKG None  Radiology No results found.  Procedures Procedures   Medications Ordered in ED Medications - No data to display  ED Course  I have reviewed the triage vital signs and the nursing notes.  Pertinent labs & imaging  results that were available during my care of the patient were reviewed by me and considered in my medical decision making (see chart for details).    MDM Rules/Calculators/A&P                           Pt comes in after power loss. She is on 2-3 L of O2 constantly due to her CHF.  She has no complaints at this time and is resting comfortably  No lab work-up indicated, no x-rays indicated.  Home medications will be ordered.  Patient took her night meds already.  Final Clinical Impression(s) / ED Diagnoses Final diagnoses:  None    Rx / DC Orders ED Discharge Orders     None        , MD 06/05/21 2358

## 2021-06-06 DIAGNOSIS — J9611 Chronic respiratory failure with hypoxia: Secondary | ICD-10-CM | POA: Diagnosis not present

## 2021-06-06 MED ORDER — APIXABAN 5 MG PO TABS: 5.0000 mg | ORAL_TABLET | Freq: Two times a day (BID) | ORAL | Status: DC

## 2021-06-06 MED ORDER — DILTIAZEM HCL ER COATED BEADS 180 MG PO CP24: 180.0000 mg | ORAL_CAPSULE | Freq: Every day | ORAL | Status: DC

## 2021-06-06 MED ORDER — TORSEMIDE 20 MG PO TABS: 20.0000 mg | ORAL_TABLET | ORAL | Status: DC

## 2021-06-06 MED ORDER — TORSEMIDE 20 MG PO TABS: 40.0000 mg | ORAL_TABLET | ORAL | Status: DC

## 2021-06-06 MED ORDER — METOPROLOL TARTRATE 50 MG PO TABS: 50.0000 mg | ORAL_TABLET | Freq: Two times a day (BID) | ORAL | Status: DC

## 2021-06-06 MED ORDER — ISOSORBIDE MONONITRATE ER 30 MG PO TB24: 30.0000 mg | ORAL_TABLET | Freq: Every day | ORAL | Status: DC

## 2021-06-06 MED ORDER — POTASSIUM CHLORIDE CRYS ER 20 MEQ PO TBCR: 20.0000 meq | EXTENDED_RELEASE_TABLET | Freq: Every day | ORAL | Status: DC

## 2021-06-06 MED ORDER — POTASSIUM CHLORIDE ER 20 MEQ PO TBCR: 1.0000 | EXTENDED_RELEASE_TABLET | Freq: Every day | ORAL | Status: DC

## 2021-06-06 MED ORDER — ATORVASTATIN CALCIUM 40 MG PO TABS: 40.0000 mg | ORAL_TABLET | Freq: Every day | ORAL | Status: DC

## 2021-06-06 MED ORDER — DULOXETINE HCL 30 MG PO CPEP: 60.0000 mg | ORAL_CAPSULE | Freq: Every day | ORAL | Status: DC

## 2021-06-06 MED ORDER — HYDRALAZINE HCL 10 MG PO TABS: 10.0000 mg | ORAL_TABLET | Freq: Two times a day (BID) | ORAL | Status: DC

## 2021-06-06 MED ORDER — ZOLPIDEM TARTRATE 5 MG PO TABS: 5.0000 mg | ORAL_TABLET | Freq: Every day | ORAL | Status: DC

## 2021-06-06 MED ORDER — ZOLPIDEM TARTRATE 5 MG PO TABS: 10.0000 mg | ORAL_TABLET | Freq: Every day | ORAL | Status: DC

## 2021-06-06 NOTE — ED Notes (Signed)
Pt son called and states they have power again and he can come pick her up. EDP notified for dc orders. Son will pick up around 10am

## 2021-06-06 NOTE — ED Provider Notes (Signed)
Per RN son has called to say the patient's power is back on and he will come get her to take her home.    Pollyann Savoy, MD 06/06/21 [phone]

## 2021-06-06 NOTE — ED Notes (Signed)
Pt daughter notified staff before she left that she had already given her mother her nighttime medications. Pt unable to tell this nurse what meds she was given so for this reason I will hold ordered meds.

## 2021-06-08 NOTE — Telephone Encounter (Signed)
Okay to start with physical therapy

## 2021-06-08 NOTE — Telephone Encounter (Signed)
Called [facility] and spoke with Tiffany.  Dr. Trena Platt recommendations given. Understanding stated.  Nothing further at this time.

## 2021-06-09 ENCOUNTER — Telehealth: Payer: Self-pay | Admitting: Pulmonary Disease

## 2021-06-09 DIAGNOSIS — J449 Chronic obstructive pulmonary disease, unspecified: Secondary | ICD-10-CM | POA: Diagnosis not present

## 2021-06-09 DIAGNOSIS — F32A Depression, unspecified: Secondary | ICD-10-CM | POA: Diagnosis not present

## 2021-06-09 DIAGNOSIS — Z7901 Long term (current) use of anticoagulants: Secondary | ICD-10-CM | POA: Diagnosis not present

## 2021-06-09 DIAGNOSIS — Z9181 History of falling: Secondary | ICD-10-CM | POA: Diagnosis not present

## 2021-06-09 DIAGNOSIS — E785 Hyperlipidemia, unspecified: Secondary | ICD-10-CM | POA: Diagnosis not present

## 2021-06-09 DIAGNOSIS — H9193 Unspecified hearing loss, bilateral: Secondary | ICD-10-CM | POA: Diagnosis not present

## 2021-06-09 DIAGNOSIS — R011 Cardiac murmur, unspecified: Secondary | ICD-10-CM | POA: Diagnosis not present

## 2021-06-09 DIAGNOSIS — Z9981 Dependence on supplemental oxygen: Secondary | ICD-10-CM | POA: Diagnosis not present

## 2021-06-09 DIAGNOSIS — Z993 Dependence on wheelchair: Secondary | ICD-10-CM | POA: Diagnosis not present

## 2021-06-09 DIAGNOSIS — Z79891 Long term (current) use of opiate analgesic: Secondary | ICD-10-CM | POA: Diagnosis not present

## 2021-06-09 DIAGNOSIS — I272 Pulmonary hypertension, unspecified: Secondary | ICD-10-CM | POA: Diagnosis not present

## 2021-06-09 DIAGNOSIS — I110 Hypertensive heart disease with heart failure: Secondary | ICD-10-CM | POA: Diagnosis not present

## 2021-06-09 DIAGNOSIS — G4733 Obstructive sleep apnea (adult) (pediatric): Secondary | ICD-10-CM | POA: Diagnosis not present

## 2021-06-09 DIAGNOSIS — K219 Gastro-esophageal reflux disease without esophagitis: Secondary | ICD-10-CM | POA: Diagnosis not present

## 2021-06-09 DIAGNOSIS — I5030 Unspecified diastolic (congestive) heart failure: Secondary | ICD-10-CM | POA: Diagnosis not present

## 2021-06-09 DIAGNOSIS — I4891 Unspecified atrial fibrillation: Secondary | ICD-10-CM | POA: Diagnosis not present

## 2021-06-09 DIAGNOSIS — Z8673 Personal history of transient ischemic attack (TIA), and cerebral infarction without residual deficits: Secondary | ICD-10-CM | POA: Diagnosis not present

## 2021-06-09 DIAGNOSIS — M1990 Unspecified osteoarthritis, unspecified site: Secondary | ICD-10-CM | POA: Diagnosis not present

## 2021-06-10 NOTE — Telephone Encounter (Signed)
Dr. Val Eagle, please advise if you are okay providing the verbal to [facility] based off of what they have stated.

## 2021-06-11 NOTE — Telephone Encounter (Signed)
Called [place] but she did not answer. Left message for her to call us back tomorrow.

## 2021-06-11 NOTE — Telephone Encounter (Signed)
She is okay to have at home physical therapy  What ever scheduling is most appropriate for her

## 2021-06-15 DIAGNOSIS — Z8673 Personal history of transient ischemic attack (TIA), and cerebral infarction without residual deficits: Secondary | ICD-10-CM | POA: Diagnosis not present

## 2021-06-15 DIAGNOSIS — G4733 Obstructive sleep apnea (adult) (pediatric): Secondary | ICD-10-CM | POA: Diagnosis not present

## 2021-06-15 DIAGNOSIS — K219 Gastro-esophageal reflux disease without esophagitis: Secondary | ICD-10-CM | POA: Diagnosis not present

## 2021-06-15 DIAGNOSIS — I272 Pulmonary hypertension, unspecified: Secondary | ICD-10-CM | POA: Diagnosis not present

## 2021-06-15 DIAGNOSIS — F32A Depression, unspecified: Secondary | ICD-10-CM | POA: Diagnosis not present

## 2021-06-15 DIAGNOSIS — I110 Hypertensive heart disease with heart failure: Secondary | ICD-10-CM | POA: Diagnosis not present

## 2021-06-15 DIAGNOSIS — Z9981 Dependence on supplemental oxygen: Secondary | ICD-10-CM | POA: Diagnosis not present

## 2021-06-15 DIAGNOSIS — Z9181 History of falling: Secondary | ICD-10-CM | POA: Diagnosis not present

## 2021-06-15 DIAGNOSIS — E785 Hyperlipidemia, unspecified: Secondary | ICD-10-CM | POA: Diagnosis not present

## 2021-06-15 DIAGNOSIS — H9193 Unspecified hearing loss, bilateral: Secondary | ICD-10-CM | POA: Diagnosis not present

## 2021-06-15 DIAGNOSIS — J449 Chronic obstructive pulmonary disease, unspecified: Secondary | ICD-10-CM | POA: Diagnosis not present

## 2021-06-15 DIAGNOSIS — R011 Cardiac murmur, unspecified: Secondary | ICD-10-CM | POA: Diagnosis not present

## 2021-06-15 DIAGNOSIS — I5030 Unspecified diastolic (congestive) heart failure: Secondary | ICD-10-CM | POA: Diagnosis not present

## 2021-06-15 DIAGNOSIS — Z7901 Long term (current) use of anticoagulants: Secondary | ICD-10-CM | POA: Diagnosis not present

## 2021-06-15 DIAGNOSIS — Z993 Dependence on wheelchair: Secondary | ICD-10-CM | POA: Diagnosis not present

## 2021-06-15 DIAGNOSIS — I4891 Unspecified atrial fibrillation: Secondary | ICD-10-CM | POA: Diagnosis not present

## 2021-06-15 DIAGNOSIS — Z79891 Long term (current) use of opiate analgesic: Secondary | ICD-10-CM | POA: Diagnosis not present

## 2021-06-15 DIAGNOSIS — M1990 Unspecified osteoarthritis, unspecified site: Secondary | ICD-10-CM | POA: Diagnosis not present

## 2021-06-15 NOTE — Telephone Encounter (Signed)
Spoke with Clearence Cheek and advised PT ok per AO  She verbalized understanding and nothing further needed

## 2021-06-16 ENCOUNTER — Other Ambulatory Visit: Payer: Self-pay | Admitting: Internal Medicine

## 2021-06-16 ENCOUNTER — Ambulatory Visit
Admission: RE | Admit: 2021-06-16 | Discharge: 2021-06-16 | Disposition: A | Discharge status: Home / Self Care | Payer: Medicare Other | Source: Ambulatory Visit | Attending: Internal Medicine | Admitting: Internal Medicine

## 2021-06-16 ENCOUNTER — Other Ambulatory Visit: Payer: Self-pay

## 2021-06-16 DIAGNOSIS — R1012 Left upper quadrant pain: Secondary | ICD-10-CM | POA: Diagnosis not present

## 2021-06-16 DIAGNOSIS — R634 Abnormal weight loss: Secondary | ICD-10-CM | POA: Diagnosis not present

## 2021-06-16 DIAGNOSIS — R0609 Other forms of dyspnea: Secondary | ICD-10-CM

## 2021-06-16 DIAGNOSIS — I10 Essential (primary) hypertension: Secondary | ICD-10-CM | POA: Diagnosis not present

## 2021-06-16 DIAGNOSIS — I5032 Chronic diastolic (congestive) heart failure: Secondary | ICD-10-CM | POA: Diagnosis not present

## 2021-06-16 DIAGNOSIS — Z23 Encounter for immunization: Secondary | ICD-10-CM | POA: Diagnosis not present

## 2021-06-17 DIAGNOSIS — Z8673 Personal history of transient ischemic attack (TIA), and cerebral infarction without residual deficits: Secondary | ICD-10-CM | POA: Diagnosis not present

## 2021-06-17 DIAGNOSIS — I272 Pulmonary hypertension, unspecified: Secondary | ICD-10-CM | POA: Diagnosis not present

## 2021-06-17 DIAGNOSIS — K219 Gastro-esophageal reflux disease without esophagitis: Secondary | ICD-10-CM | POA: Diagnosis not present

## 2021-06-17 DIAGNOSIS — F32A Depression, unspecified: Secondary | ICD-10-CM | POA: Diagnosis not present

## 2021-06-17 DIAGNOSIS — G4733 Obstructive sleep apnea (adult) (pediatric): Secondary | ICD-10-CM | POA: Diagnosis not present

## 2021-06-17 DIAGNOSIS — H9193 Unspecified hearing loss, bilateral: Secondary | ICD-10-CM | POA: Diagnosis not present

## 2021-06-17 DIAGNOSIS — R011 Cardiac murmur, unspecified: Secondary | ICD-10-CM | POA: Diagnosis not present

## 2021-06-17 DIAGNOSIS — Z993 Dependence on wheelchair: Secondary | ICD-10-CM | POA: Diagnosis not present

## 2021-06-17 DIAGNOSIS — Z9981 Dependence on supplemental oxygen: Secondary | ICD-10-CM | POA: Diagnosis not present

## 2021-06-17 DIAGNOSIS — Z79891 Long term (current) use of opiate analgesic: Secondary | ICD-10-CM | POA: Diagnosis not present

## 2021-06-17 DIAGNOSIS — I5030 Unspecified diastolic (congestive) heart failure: Secondary | ICD-10-CM | POA: Diagnosis not present

## 2021-06-17 DIAGNOSIS — I4891 Unspecified atrial fibrillation: Secondary | ICD-10-CM | POA: Diagnosis not present

## 2021-06-17 DIAGNOSIS — Z9181 History of falling: Secondary | ICD-10-CM | POA: Diagnosis not present

## 2021-06-17 DIAGNOSIS — I110 Hypertensive heart disease with heart failure: Secondary | ICD-10-CM | POA: Diagnosis not present

## 2021-06-17 DIAGNOSIS — M1990 Unspecified osteoarthritis, unspecified site: Secondary | ICD-10-CM | POA: Diagnosis not present

## 2021-06-17 DIAGNOSIS — E785 Hyperlipidemia, unspecified: Secondary | ICD-10-CM | POA: Diagnosis not present

## 2021-06-17 DIAGNOSIS — J449 Chronic obstructive pulmonary disease, unspecified: Secondary | ICD-10-CM | POA: Diagnosis not present

## 2021-06-17 DIAGNOSIS — Z7901 Long term (current) use of anticoagulants: Secondary | ICD-10-CM | POA: Diagnosis not present

## 2021-06-22 DIAGNOSIS — M1990 Unspecified osteoarthritis, unspecified site: Secondary | ICD-10-CM | POA: Diagnosis not present

## 2021-06-22 DIAGNOSIS — Z7901 Long term (current) use of anticoagulants: Secondary | ICD-10-CM | POA: Diagnosis not present

## 2021-06-22 DIAGNOSIS — Z8673 Personal history of transient ischemic attack (TIA), and cerebral infarction without residual deficits: Secondary | ICD-10-CM | POA: Diagnosis not present

## 2021-06-22 DIAGNOSIS — Z79891 Long term (current) use of opiate analgesic: Secondary | ICD-10-CM | POA: Diagnosis not present

## 2021-06-22 DIAGNOSIS — I4891 Unspecified atrial fibrillation: Secondary | ICD-10-CM | POA: Diagnosis not present

## 2021-06-22 DIAGNOSIS — I272 Pulmonary hypertension, unspecified: Secondary | ICD-10-CM | POA: Diagnosis not present

## 2021-06-22 DIAGNOSIS — R011 Cardiac murmur, unspecified: Secondary | ICD-10-CM | POA: Diagnosis not present

## 2021-06-22 DIAGNOSIS — H9193 Unspecified hearing loss, bilateral: Secondary | ICD-10-CM | POA: Diagnosis not present

## 2021-06-22 DIAGNOSIS — G4733 Obstructive sleep apnea (adult) (pediatric): Secondary | ICD-10-CM | POA: Diagnosis not present

## 2021-06-22 DIAGNOSIS — Z993 Dependence on wheelchair: Secondary | ICD-10-CM | POA: Diagnosis not present

## 2021-06-22 DIAGNOSIS — I5030 Unspecified diastolic (congestive) heart failure: Secondary | ICD-10-CM | POA: Diagnosis not present

## 2021-06-22 DIAGNOSIS — E785 Hyperlipidemia, unspecified: Secondary | ICD-10-CM | POA: Diagnosis not present

## 2021-06-22 DIAGNOSIS — J449 Chronic obstructive pulmonary disease, unspecified: Secondary | ICD-10-CM | POA: Diagnosis not present

## 2021-06-22 DIAGNOSIS — F32A Depression, unspecified: Secondary | ICD-10-CM | POA: Diagnosis not present

## 2021-06-22 DIAGNOSIS — Z9981 Dependence on supplemental oxygen: Secondary | ICD-10-CM | POA: Diagnosis not present

## 2021-06-22 DIAGNOSIS — Z9181 History of falling: Secondary | ICD-10-CM | POA: Diagnosis not present

## 2021-06-22 DIAGNOSIS — I110 Hypertensive heart disease with heart failure: Secondary | ICD-10-CM | POA: Diagnosis not present

## 2021-06-22 DIAGNOSIS — K219 Gastro-esophageal reflux disease without esophagitis: Secondary | ICD-10-CM | POA: Diagnosis not present

## 2021-06-24 DIAGNOSIS — Z7901 Long term (current) use of anticoagulants: Secondary | ICD-10-CM | POA: Diagnosis not present

## 2021-06-24 DIAGNOSIS — Z9981 Dependence on supplemental oxygen: Secondary | ICD-10-CM | POA: Diagnosis not present

## 2021-06-24 DIAGNOSIS — J449 Chronic obstructive pulmonary disease, unspecified: Secondary | ICD-10-CM | POA: Diagnosis not present

## 2021-06-24 DIAGNOSIS — Z993 Dependence on wheelchair: Secondary | ICD-10-CM | POA: Diagnosis not present

## 2021-06-24 DIAGNOSIS — K219 Gastro-esophageal reflux disease without esophagitis: Secondary | ICD-10-CM | POA: Diagnosis not present

## 2021-06-24 DIAGNOSIS — H9193 Unspecified hearing loss, bilateral: Secondary | ICD-10-CM | POA: Diagnosis not present

## 2021-06-24 DIAGNOSIS — I110 Hypertensive heart disease with heart failure: Secondary | ICD-10-CM | POA: Diagnosis not present

## 2021-06-24 DIAGNOSIS — Z9181 History of falling: Secondary | ICD-10-CM | POA: Diagnosis not present

## 2021-06-24 DIAGNOSIS — I4891 Unspecified atrial fibrillation: Secondary | ICD-10-CM | POA: Diagnosis not present

## 2021-06-24 DIAGNOSIS — E785 Hyperlipidemia, unspecified: Secondary | ICD-10-CM | POA: Diagnosis not present

## 2021-06-24 DIAGNOSIS — I272 Pulmonary hypertension, unspecified: Secondary | ICD-10-CM | POA: Diagnosis not present

## 2021-06-24 DIAGNOSIS — R011 Cardiac murmur, unspecified: Secondary | ICD-10-CM | POA: Diagnosis not present

## 2021-06-24 DIAGNOSIS — M1990 Unspecified osteoarthritis, unspecified site: Secondary | ICD-10-CM | POA: Diagnosis not present

## 2021-06-24 DIAGNOSIS — Z79891 Long term (current) use of opiate analgesic: Secondary | ICD-10-CM | POA: Diagnosis not present

## 2021-06-24 DIAGNOSIS — F32A Depression, unspecified: Secondary | ICD-10-CM | POA: Diagnosis not present

## 2021-06-24 DIAGNOSIS — G4733 Obstructive sleep apnea (adult) (pediatric): Secondary | ICD-10-CM | POA: Diagnosis not present

## 2021-06-24 DIAGNOSIS — Z8673 Personal history of transient ischemic attack (TIA), and cerebral infarction without residual deficits: Secondary | ICD-10-CM | POA: Diagnosis not present

## 2021-06-24 DIAGNOSIS — I5030 Unspecified diastolic (congestive) heart failure: Secondary | ICD-10-CM | POA: Diagnosis not present

## 2021-06-25 ENCOUNTER — Telehealth: Payer: Self-pay | Admitting: Pulmonary Disease

## 2021-06-25 NOTE — Telephone Encounter (Signed)
Order was sent on 9/30 to [facility] and confirmation received from [facility] on 9/30.  I called Danielle to check on this.  She is going to look it up and call me back.

## 2021-06-25 NOTE — Telephone Encounter (Signed)
Correct phone number for [name] is [phone]. [facility] fax number is [phone].

## 2021-06-25 NOTE — Telephone Encounter (Signed)
Carrie Best called me back.  Order was received.  Pt did not qualify for poc.  She is going to call [name] now and make her aware.  Nothing further needed.

## 2021-06-29 DIAGNOSIS — I110 Hypertensive heart disease with heart failure: Secondary | ICD-10-CM | POA: Diagnosis not present

## 2021-06-29 DIAGNOSIS — G4733 Obstructive sleep apnea (adult) (pediatric): Secondary | ICD-10-CM | POA: Diagnosis not present

## 2021-06-29 DIAGNOSIS — Z9181 History of falling: Secondary | ICD-10-CM | POA: Diagnosis not present

## 2021-06-29 DIAGNOSIS — R011 Cardiac murmur, unspecified: Secondary | ICD-10-CM | POA: Diagnosis not present

## 2021-06-29 DIAGNOSIS — Z9981 Dependence on supplemental oxygen: Secondary | ICD-10-CM | POA: Diagnosis not present

## 2021-06-29 DIAGNOSIS — K219 Gastro-esophageal reflux disease without esophagitis: Secondary | ICD-10-CM | POA: Diagnosis not present

## 2021-06-29 DIAGNOSIS — Z7901 Long term (current) use of anticoagulants: Secondary | ICD-10-CM | POA: Diagnosis not present

## 2021-06-29 DIAGNOSIS — M1990 Unspecified osteoarthritis, unspecified site: Secondary | ICD-10-CM | POA: Diagnosis not present

## 2021-06-29 DIAGNOSIS — F32A Depression, unspecified: Secondary | ICD-10-CM | POA: Diagnosis not present

## 2021-06-29 DIAGNOSIS — H9193 Unspecified hearing loss, bilateral: Secondary | ICD-10-CM | POA: Diagnosis not present

## 2021-06-29 DIAGNOSIS — I4891 Unspecified atrial fibrillation: Secondary | ICD-10-CM | POA: Diagnosis not present

## 2021-06-29 DIAGNOSIS — E785 Hyperlipidemia, unspecified: Secondary | ICD-10-CM | POA: Diagnosis not present

## 2021-06-29 DIAGNOSIS — Z993 Dependence on wheelchair: Secondary | ICD-10-CM | POA: Diagnosis not present

## 2021-06-29 DIAGNOSIS — J449 Chronic obstructive pulmonary disease, unspecified: Secondary | ICD-10-CM | POA: Diagnosis not present

## 2021-06-29 DIAGNOSIS — Z8673 Personal history of transient ischemic attack (TIA), and cerebral infarction without residual deficits: Secondary | ICD-10-CM | POA: Diagnosis not present

## 2021-06-29 DIAGNOSIS — Z79891 Long term (current) use of opiate analgesic: Secondary | ICD-10-CM | POA: Diagnosis not present

## 2021-06-29 DIAGNOSIS — I5030 Unspecified diastolic (congestive) heart failure: Secondary | ICD-10-CM | POA: Diagnosis not present

## 2021-06-29 DIAGNOSIS — I272 Pulmonary hypertension, unspecified: Secondary | ICD-10-CM | POA: Diagnosis not present

## 2021-07-01 DIAGNOSIS — I5030 Unspecified diastolic (congestive) heart failure: Secondary | ICD-10-CM | POA: Diagnosis not present

## 2021-07-01 DIAGNOSIS — Z993 Dependence on wheelchair: Secondary | ICD-10-CM | POA: Diagnosis not present

## 2021-07-01 DIAGNOSIS — M1990 Unspecified osteoarthritis, unspecified site: Secondary | ICD-10-CM | POA: Diagnosis not present

## 2021-07-01 DIAGNOSIS — J449 Chronic obstructive pulmonary disease, unspecified: Secondary | ICD-10-CM | POA: Diagnosis not present

## 2021-07-01 DIAGNOSIS — G4733 Obstructive sleep apnea (adult) (pediatric): Secondary | ICD-10-CM | POA: Diagnosis not present

## 2021-07-01 DIAGNOSIS — I272 Pulmonary hypertension, unspecified: Secondary | ICD-10-CM | POA: Diagnosis not present

## 2021-07-01 DIAGNOSIS — R011 Cardiac murmur, unspecified: Secondary | ICD-10-CM | POA: Diagnosis not present

## 2021-07-01 DIAGNOSIS — E785 Hyperlipidemia, unspecified: Secondary | ICD-10-CM | POA: Diagnosis not present

## 2021-07-01 DIAGNOSIS — K219 Gastro-esophageal reflux disease without esophagitis: Secondary | ICD-10-CM | POA: Diagnosis not present

## 2021-07-01 DIAGNOSIS — I4891 Unspecified atrial fibrillation: Secondary | ICD-10-CM | POA: Diagnosis not present

## 2021-07-01 DIAGNOSIS — Z8673 Personal history of transient ischemic attack (TIA), and cerebral infarction without residual deficits: Secondary | ICD-10-CM | POA: Diagnosis not present

## 2021-07-01 DIAGNOSIS — F32A Depression, unspecified: Secondary | ICD-10-CM | POA: Diagnosis not present

## 2021-07-01 DIAGNOSIS — Z9981 Dependence on supplemental oxygen: Secondary | ICD-10-CM | POA: Diagnosis not present

## 2021-07-01 DIAGNOSIS — Z79891 Long term (current) use of opiate analgesic: Secondary | ICD-10-CM | POA: Diagnosis not present

## 2021-07-01 DIAGNOSIS — H9193 Unspecified hearing loss, bilateral: Secondary | ICD-10-CM | POA: Diagnosis not present

## 2021-07-01 DIAGNOSIS — Z7901 Long term (current) use of anticoagulants: Secondary | ICD-10-CM | POA: Diagnosis not present

## 2021-07-01 DIAGNOSIS — Z9181 History of falling: Secondary | ICD-10-CM | POA: Diagnosis not present

## 2021-07-01 DIAGNOSIS — I110 Hypertensive heart disease with heart failure: Secondary | ICD-10-CM | POA: Diagnosis not present

## 2021-07-02 DIAGNOSIS — E7800 Pure hypercholesterolemia, unspecified: Secondary | ICD-10-CM | POA: Diagnosis not present

## 2021-07-02 DIAGNOSIS — I4891 Unspecified atrial fibrillation: Secondary | ICD-10-CM | POA: Diagnosis not present

## 2021-07-02 DIAGNOSIS — M170 Bilateral primary osteoarthritis of knee: Secondary | ICD-10-CM | POA: Diagnosis not present

## 2021-07-02 DIAGNOSIS — I5032 Chronic diastolic (congestive) heart failure: Secondary | ICD-10-CM | POA: Diagnosis not present

## 2021-07-02 DIAGNOSIS — I10 Essential (primary) hypertension: Secondary | ICD-10-CM | POA: Diagnosis not present

## 2021-07-07 DIAGNOSIS — Z79891 Long term (current) use of opiate analgesic: Secondary | ICD-10-CM | POA: Diagnosis not present

## 2021-07-07 DIAGNOSIS — G4733 Obstructive sleep apnea (adult) (pediatric): Secondary | ICD-10-CM | POA: Diagnosis not present

## 2021-07-07 DIAGNOSIS — H9193 Unspecified hearing loss, bilateral: Secondary | ICD-10-CM | POA: Diagnosis not present

## 2021-07-07 DIAGNOSIS — I4891 Unspecified atrial fibrillation: Secondary | ICD-10-CM | POA: Diagnosis not present

## 2021-07-07 DIAGNOSIS — K219 Gastro-esophageal reflux disease without esophagitis: Secondary | ICD-10-CM | POA: Diagnosis not present

## 2021-07-07 DIAGNOSIS — I272 Pulmonary hypertension, unspecified: Secondary | ICD-10-CM | POA: Diagnosis not present

## 2021-07-07 DIAGNOSIS — R011 Cardiac murmur, unspecified: Secondary | ICD-10-CM | POA: Diagnosis not present

## 2021-07-07 DIAGNOSIS — M1990 Unspecified osteoarthritis, unspecified site: Secondary | ICD-10-CM | POA: Diagnosis not present

## 2021-07-07 DIAGNOSIS — Z9181 History of falling: Secondary | ICD-10-CM | POA: Diagnosis not present

## 2021-07-07 DIAGNOSIS — I5030 Unspecified diastolic (congestive) heart failure: Secondary | ICD-10-CM | POA: Diagnosis not present

## 2021-07-07 DIAGNOSIS — Z993 Dependence on wheelchair: Secondary | ICD-10-CM | POA: Diagnosis not present

## 2021-07-07 DIAGNOSIS — Z8673 Personal history of transient ischemic attack (TIA), and cerebral infarction without residual deficits: Secondary | ICD-10-CM | POA: Diagnosis not present

## 2021-07-07 DIAGNOSIS — Z9981 Dependence on supplemental oxygen: Secondary | ICD-10-CM | POA: Diagnosis not present

## 2021-07-07 DIAGNOSIS — Z7901 Long term (current) use of anticoagulants: Secondary | ICD-10-CM | POA: Diagnosis not present

## 2021-07-07 DIAGNOSIS — F32A Depression, unspecified: Secondary | ICD-10-CM | POA: Diagnosis not present

## 2021-07-07 DIAGNOSIS — J449 Chronic obstructive pulmonary disease, unspecified: Secondary | ICD-10-CM | POA: Diagnosis not present

## 2021-07-07 DIAGNOSIS — E785 Hyperlipidemia, unspecified: Secondary | ICD-10-CM | POA: Diagnosis not present

## 2021-07-07 DIAGNOSIS — I110 Hypertensive heart disease with heart failure: Secondary | ICD-10-CM | POA: Diagnosis not present

## 2021-07-10 DIAGNOSIS — J449 Chronic obstructive pulmonary disease, unspecified: Secondary | ICD-10-CM | POA: Diagnosis not present

## 2021-07-13 DIAGNOSIS — I272 Pulmonary hypertension, unspecified: Secondary | ICD-10-CM | POA: Diagnosis not present

## 2021-07-13 DIAGNOSIS — F32A Depression, unspecified: Secondary | ICD-10-CM | POA: Diagnosis not present

## 2021-07-13 DIAGNOSIS — J449 Chronic obstructive pulmonary disease, unspecified: Secondary | ICD-10-CM | POA: Diagnosis not present

## 2021-07-13 DIAGNOSIS — I4891 Unspecified atrial fibrillation: Secondary | ICD-10-CM | POA: Diagnosis not present

## 2021-07-13 DIAGNOSIS — Z993 Dependence on wheelchair: Secondary | ICD-10-CM | POA: Diagnosis not present

## 2021-07-13 DIAGNOSIS — K219 Gastro-esophageal reflux disease without esophagitis: Secondary | ICD-10-CM | POA: Diagnosis not present

## 2021-07-13 DIAGNOSIS — I110 Hypertensive heart disease with heart failure: Secondary | ICD-10-CM | POA: Diagnosis not present

## 2021-07-13 DIAGNOSIS — E785 Hyperlipidemia, unspecified: Secondary | ICD-10-CM | POA: Diagnosis not present

## 2021-07-13 DIAGNOSIS — Z7901 Long term (current) use of anticoagulants: Secondary | ICD-10-CM | POA: Diagnosis not present

## 2021-07-13 DIAGNOSIS — Z79891 Long term (current) use of opiate analgesic: Secondary | ICD-10-CM | POA: Diagnosis not present

## 2021-07-13 DIAGNOSIS — I5030 Unspecified diastolic (congestive) heart failure: Secondary | ICD-10-CM | POA: Diagnosis not present

## 2021-07-13 DIAGNOSIS — R011 Cardiac murmur, unspecified: Secondary | ICD-10-CM | POA: Diagnosis not present

## 2021-07-13 DIAGNOSIS — Z8673 Personal history of transient ischemic attack (TIA), and cerebral infarction without residual deficits: Secondary | ICD-10-CM | POA: Diagnosis not present

## 2021-07-13 DIAGNOSIS — G4733 Obstructive sleep apnea (adult) (pediatric): Secondary | ICD-10-CM | POA: Diagnosis not present

## 2021-07-13 DIAGNOSIS — H9193 Unspecified hearing loss, bilateral: Secondary | ICD-10-CM | POA: Diagnosis not present

## 2021-07-13 DIAGNOSIS — Z9181 History of falling: Secondary | ICD-10-CM | POA: Diagnosis not present

## 2021-07-13 DIAGNOSIS — M1990 Unspecified osteoarthritis, unspecified site: Secondary | ICD-10-CM | POA: Diagnosis not present

## 2021-07-13 DIAGNOSIS — Z9981 Dependence on supplemental oxygen: Secondary | ICD-10-CM | POA: Diagnosis not present

## 2021-07-22 ENCOUNTER — Encounter: Payer: Self-pay | Admitting: Cardiovascular Disease

## 2021-07-22 ENCOUNTER — Other Ambulatory Visit: Payer: Self-pay

## 2021-07-22 ENCOUNTER — Ambulatory Visit: Payer: Medicare Other | Admitting: Cardiovascular Disease

## 2021-07-22 VITALS — BP 132/66 | HR 63 | Ht 68.0 in | Wt 164.8 lb

## 2021-07-22 DIAGNOSIS — I5033 Acute on chronic diastolic (congestive) heart failure: Secondary | ICD-10-CM | POA: Diagnosis not present

## 2021-07-22 DIAGNOSIS — I482 Chronic atrial fibrillation, unspecified: Secondary | ICD-10-CM

## 2021-07-22 DIAGNOSIS — I10 Essential (primary) hypertension: Secondary | ICD-10-CM

## 2021-07-22 NOTE — Progress Notes (Signed)
Cardiology Office Note   Date:  07/22/2021   ID:  Carrie Best, DOB [birth year], MRN [id number]  PCP:  Kirby Funk, MD  Cardiologist:   Kristeen Miss, MD   Chief Complaint  Patient presents with   Congestive Heart Failure        1. Atrial fibrillation 2. Hypertension 3. Dementia 4. Diastolic dysfunction 5. Hyperlipidemia   Carrie Best is an 87 yo who I have seen in the past.  She was admitted with respiratory failure last year.   She was found to  She has developed some dementia.  She has had frequent episodes of dyspnea.    Typically with exertion.   She does not eat any extra salt.    She still feels poorly - has good days and bad days   She is not able to walk any distance primarily due to leg weakness and leg pain.   Jan 16, 2014:  Carrie Best is doing oK.  Able to do her normal activies with minimal dyspnea ( she does not do much).      February 06, 2015:  Carrie Best is a 87 y.o. female who presents for her  Atrial fib and chronic diastolic congestive heart failure. She was recently hospitalized for mental status changes ( hypersomulence)  No specific etiology was found   She needs to have knee surgery.   Will need to hold the Xarelto  February 06, 2016: Had her knee surgery last year but it was not successful.   Now she is wheelchair buond. Wants to have shoulder surgery now   Oct. 23, 2018:    Had shoulder surgery .   Had some complications apparently . Had right knee surgery that did not go well.    June 25, 2018:  Has not felt well since last week .  Has had some dyspnea,   Is in a wheelchair all the time  + orthopnea  Does not eat much salty foods  Has had some leg swelling  Takes furosemide 40 BID   Echo last year showed normal LV systolic function  Feb. 18, 2020:  Carrie Best is seen back for follow-up of her atrial fibrillation, hypertension and dementia. She was hospitalized with CHF exacerbation in Jan.     torsemide has been decreased  to 20 mg a day .   August 24, 2019:  Carrie Best is seen today for follow-up visit.   Seen with daughter in [place], [place].  She has a history of atrial fibrillation, hypertension and dementia.  She also has congestive heart failure.  Echo to perform June 20, 2019 revealed normal left ventricular systolic function.  She has presumed diastolic dysfunction although this cannot be evaluated because of her atrial fibrillation. Does not walk.   Due to previous knee surgery .    Examined in her wheel chair  Is getting progressively more and more short of breat Eats liver pudding weekly .   Nov. 16, 2022: Carrie Best is see for follow up of her Atrial fib, HTN, dementia .  Seen with her son, [name] and [place], [name] . Is unable to walk Can transfer fairly well.  Going to her appointments wears her out significantly . It takes 2 family members to bring her here  We discussed having her come on an as needed basis - assuming Dr. Valentina Lucks will assume responsibility for all her prescriptions     Past Medical History:  Diagnosis Date   Anxiety    Arthritis  Congestive heart failure (HCC)    Congestive heart failure symptoms   Depression    Diastolic dysfunction    Dyspnea    occasionally, with exertion   Dysrhythmia 05-10-12   hx. A. Fib., rate is controlled-tx. Xarelto   GERD (gastroesophageal reflux disease) 05-10-12   tx. with meds as needed   Hearing loss 05-10-12   bilateral hearing aids.   Heart murmur    History of kidney stones    Hyperlipidemia    Hypertension    Pneumonia 05-10-12   dx. in July- tx. antibiotics   Stroke ([place]) 05-10-12   Lt.CVA note on scans-hx. freq. falls/ none past 6 months 01/21/14   Wears hearing aid    bilateral    Past Surgical History:  Procedure Laterality Date   APPENDECTOMY     CATARACT EXTRACTION, BILATERAL  05-10-12   bilateral   CYSTOSCOPY WITH RETROGRADE PYELOGRAM, URETEROSCOPY AND STENT PLACEMENT Right 01/31/2014   Procedure: CYSTOSCOPY WITH bilateral   RETROGRADE PYELOGRAM, right URETEROSCOPY AND right  STENT PLACEMENT, bladder biopsy with fulgeration;  Surgeon: Alexis Frock, MD;  Location: [facility];  Service: Urology;  Laterality: Right;   HARDWARE REMOVAL Right 09/09/2014   Procedure: HARDWARE REMOVAL RIGHT SHOULDER;  Surgeon: Nita Sells, MD;  Location: [facility];  Service: Orthopedics;  Laterality: Right;  Right shoulder hardware removal   HOLMIUM LASER APPLICATION Right [id number]   Procedure: HOLMIUM LASER APPLICATION;  Surgeon: Alexis Frock, MD;  Location: [facility];  Service: Urology;  Laterality: Right;   HUMERUS IM NAIL Right 06/10/2014   Procedure: INTRAMEDULLARY (IM) NAIL HUMERAL;  Surgeon: Nita Sells, MD;  Location: [facility];  Service: Orthopedics;  Laterality: Right;  Right intramedullary humeral nail   INTERSTIM IMPLANT PLACEMENT  05-10-12   now malfunctioning   INTERSTIM IMPLANT REMOVAL  05/16/2012   Procedure: REMOVAL OF INTERSTIM IMPLANT;  Surgeon: Reece Packer, MD;  Location: [facility];  Service: Urology;  Laterality: N/A;   JOINT REPLACEMENT     OTHER SURGICAL HISTORY     Bladder Surgery   SHOULDER SURGERY     TOTAL KNEE ARTHROPLASTY Right 02/17/2015   Procedure: TOTAL KNEE ARTHROPLASTY;  Surgeon: Frederik Pear, MD;  Location: [facility];  Service: Orthopedics;  Laterality: Right;     Current Outpatient Medications  Medication Sig Dispense Refill   apixaban (ELIQUIS) 5 MG TABS tablet Take 1 tablet (5 mg total) by mouth 2 (two) times daily. 60 tablet 5   atorvastatin (LIPITOR) 40 MG tablet Take 1 tablet (40 mg total) by mouth daily. 30 tablet 0   diltiazem (CARDIZEM CD) 180 MG 24 hr capsule Take 180 mg by mouth daily.     DULoxetine (CYMBALTA) 60 MG capsule Take 60 mg by mouth daily.      HYDROcodone-acetaminophen (NORCO) 5-325 MG tablet Take 1 tablet by mouth 2 (two) times daily as needed. 10 tablet 0   isosorbide mononitrate (IMDUR) 30 MG 24 hr tablet TAKE 1 TABLET(30 MG) BY MOUTH DAILY 30  tablet 10   LORazepam (ATIVAN) 0.5 MG tablet Take 0.5 mg by mouth 3 (three) times daily as needed.     metoprolol tartrate (LOPRESSOR) 50 MG tablet Take 50 mg by mouth 2 (two) times daily.     polyethylene glycol (MIRALAX / GLYCOLAX) 17 g packet Take 17 g by mouth daily. 14 each 0   Potassium Chloride ER 20 MEQ TBCR Take 1 tablet by mouth daily. 30 tablet 4   torsemide (DEMADEX) 20 MG tablet  Take 1 tablet (20 mg total) by mouth See admin instructions. Take Torsemide/Demadex 2 Tablets (40 mg) on Tuesday, Thursday, Saturday and Sunday, Take 1 Tab (20 mg) on Monday, Wednesday and Friday--- 60 tablet 4   zolpidem (AMBIEN) 10 MG tablet Take 10 mg by mouth at bedtime.     hydrALAZINE (APRESOLINE) 10 MG tablet Take 1 tablet (10 mg total) by mouth 2 (two) times daily. 60 tablet 3   predniSONE (STERAPRED UNI-PAK 21 TAB) 5 MG (21) TBPK tablet Take as directed (Patient not taking: Reported on 07/22/2021) 21 tablet 0   No current facility-administered medications for this visit.    Allergies:   Codeine, Elemental sulfur, and Penicillins    Social History:  The patient  reports that she has never smoked. She has never used smokeless tobacco. She reports that she does not drink alcohol and does not use drugs.   Family History:  The patient's family history includes Stroke in her brother and mother.    ROS:  Please see the history of present illness.      All other systems are reviewed and negative.   Physical Exam: Blood pressure 132/66, pulse 63, height 5\' 8"  (1.727 m), weight 164 lb 12.8 oz (74.8 kg), SpO2 97 %.  GEN:  elderly female,  NAD examined in the wheelchair  HEENT: Normal NECK: No JVD; No carotid bruits LYMPHATICS: No lymphadenopathy CARDIAC: irreg. Irreg. , no murmurs, rubs, gallops RESPIRATORY:  Clear to auscultation without rales, wheezing or rhonchi  ABDOMEN: Soft, non-tender, non-distended MUSCULOSKELETAL:  No edema; No deformity  SKIN: Warm and dry NEUROLOGIC:  Alert and  oriented x 3   EKG:   July 22, 2021: Atrial fibrillation with a heart rate of 63.  Left bundle branch block.   Recent Labs: 07/24/2020: B Natriuretic Peptide 429.0 07/25/2020: ALT 11; Magnesium 2.0 07/27/2020: BUN 21; Creatinine, Ser 1.01; Hemoglobin 12.7; Platelets 231; Potassium 3.4; Sodium 137    Lipid Panel    Component Value Date/Time   CHOL 126 06/20/2019 0559   TRIG 75 06/20/2019 0559   HDL 45 06/20/2019 0559   CHOLHDL 2.8 06/20/2019 0559   VLDL 15 06/20/2019 0559   LDLCALC 66 06/20/2019 0559      Wt Readings from Last 3 Encounters:  07/22/21 164 lb 12.8 oz (74.8 kg)  06/05/21 141 lb 1.5 oz (64 kg)  06/03/21 141 lb 6.4 oz (64.1 kg)      Other studies Reviewed: Additional studies/ records that were reviewed today include: . Review of the above records demonstrates:   ASSESSMENT AND PLAN:  1. Atrial fibrillation -   she has chronic atrial fibrillation.  Heart rate is well controlled.  Continue current medications.  It has been coming increasingly difficult for her to get out to her appointments.  It wears her out completely and she spends the next day generally in bed according to her son.  She sees Dr. Laurann Montana on a regular basis as her primary care doctor.  She is now 87 years old, has significant dementia, and is a DNR.  I do not anticipate making any significant medication changes unless she develops some severe issues.  My hope is that Dr. Laurann Montana will be able to pick up and assume responsibility for her cardiac medications.  We will plan on seeing her on an as-needed basis.    2. Hypertension -    blood pressure is well controlled.  Continue current medications.   3. Dementia -she has significant and progressive dementia.  4. Chronic Diastolic dysfunction: . .   5. Hyperlipidemia :    6. CVA    Current medicines are reviewed at length with the patient today.  The patient does not have concerns regarding medicines.  The following changes have  been made:  no change  Labs/ tests ordered today include:   Orders Placed This Encounter  Procedures   EKG 12-Lead      Disposition:   follow up with [place] as needed.      Mertie Moores, MD  07/22/2021 2:28 PM    [facility] [address], [place], [place]  [address] Phone: [phone]; Fax: [phone]

## 2021-07-22 NOTE — Patient Instructions (Signed)
Medication Instructions:  °NO CHANGES °*If you need a refill on your cardiac medications before your next appointment, please call your pharmacy* ° ° °Lab Work: °NONE °If you have labs (blood work) drawn today and your tests are completely normal, you will receive your results only by: °MyChart Message (if you have MyChart) OR °A paper copy in the mail °If you have any lab test that is abnormal or we need to change your treatment, we will call you to review the results. ° ° °Testing/Procedures: °NONE ° ° °Follow-Up: °At [facility], you and your health needs are our priority.  As part of our continuing mission to provide you with exceptional heart care, we have created designated Provider Care Teams.  These Care Teams include your primary Cardiologist (physician) and Advanced Practice Providers (APPs -  Physician Assistants and Nurse Practitioners) who all work together to provide you with the care you need, when you need it. ° °We recommend signing up for the patient portal called "MyChart".  Sign up information is provided on this After Visit Summary.  MyChart is used to connect with patients for Virtual Visits (Telemedicine).  Patients are able to view lab/test results, encounter notes, upcoming appointments, etc.  Non-urgent messages can be sent to your provider as well.   °To learn more about what you can do with MyChart, go to [email or web address].   ° °Your next appointment:   °AS NEEDED ° ° °

## 2021-07-24 DIAGNOSIS — I110 Hypertensive heart disease with heart failure: Secondary | ICD-10-CM | POA: Diagnosis not present

## 2021-07-24 DIAGNOSIS — I4891 Unspecified atrial fibrillation: Secondary | ICD-10-CM | POA: Diagnosis not present

## 2021-07-24 DIAGNOSIS — F32A Depression, unspecified: Secondary | ICD-10-CM | POA: Diagnosis not present

## 2021-07-24 DIAGNOSIS — Z79891 Long term (current) use of opiate analgesic: Secondary | ICD-10-CM | POA: Diagnosis not present

## 2021-07-24 DIAGNOSIS — K219 Gastro-esophageal reflux disease without esophagitis: Secondary | ICD-10-CM | POA: Diagnosis not present

## 2021-07-24 DIAGNOSIS — E785 Hyperlipidemia, unspecified: Secondary | ICD-10-CM | POA: Diagnosis not present

## 2021-07-24 DIAGNOSIS — I5030 Unspecified diastolic (congestive) heart failure: Secondary | ICD-10-CM | POA: Diagnosis not present

## 2021-07-24 DIAGNOSIS — Z7901 Long term (current) use of anticoagulants: Secondary | ICD-10-CM | POA: Diagnosis not present

## 2021-07-24 DIAGNOSIS — Z9981 Dependence on supplemental oxygen: Secondary | ICD-10-CM | POA: Diagnosis not present

## 2021-07-24 DIAGNOSIS — Z9181 History of falling: Secondary | ICD-10-CM | POA: Diagnosis not present

## 2021-07-24 DIAGNOSIS — J449 Chronic obstructive pulmonary disease, unspecified: Secondary | ICD-10-CM | POA: Diagnosis not present

## 2021-07-24 DIAGNOSIS — Z8673 Personal history of transient ischemic attack (TIA), and cerebral infarction without residual deficits: Secondary | ICD-10-CM | POA: Diagnosis not present

## 2021-07-24 DIAGNOSIS — H9193 Unspecified hearing loss, bilateral: Secondary | ICD-10-CM | POA: Diagnosis not present

## 2021-07-24 DIAGNOSIS — R011 Cardiac murmur, unspecified: Secondary | ICD-10-CM | POA: Diagnosis not present

## 2021-07-24 DIAGNOSIS — Z993 Dependence on wheelchair: Secondary | ICD-10-CM | POA: Diagnosis not present

## 2021-07-24 DIAGNOSIS — M1990 Unspecified osteoarthritis, unspecified site: Secondary | ICD-10-CM | POA: Diagnosis not present

## 2021-07-24 DIAGNOSIS — G4733 Obstructive sleep apnea (adult) (pediatric): Secondary | ICD-10-CM | POA: Diagnosis not present

## 2021-07-24 DIAGNOSIS — I272 Pulmonary hypertension, unspecified: Secondary | ICD-10-CM | POA: Diagnosis not present

## 2021-07-28 DIAGNOSIS — M25511 Pain in right shoulder: Secondary | ICD-10-CM | POA: Diagnosis not present

## 2021-08-09 DIAGNOSIS — J449 Chronic obstructive pulmonary disease, unspecified: Secondary | ICD-10-CM | POA: Diagnosis not present

## 2021-08-27 ENCOUNTER — Inpatient Hospital Stay (HOSPITAL_COMMUNITY)
Admission: EM | Admit: 2021-08-27 | Discharge: 2021-09-01 | DRG: 177 | Disposition: A | Discharge status: Skilled Nursing Facility | Payer: Medicare Other | Attending: Internal Medicine | Admitting: Internal Medicine

## 2021-08-27 ENCOUNTER — Other Ambulatory Visit: Payer: Self-pay

## 2021-08-27 ENCOUNTER — Emergency Department (HOSPITAL_COMMUNITY): Payer: Medicare Other

## 2021-08-27 ENCOUNTER — Encounter (HOSPITAL_COMMUNITY): Payer: Self-pay

## 2021-08-27 DIAGNOSIS — E785 Hyperlipidemia, unspecified: Secondary | ICD-10-CM | POA: Diagnosis present

## 2021-08-27 DIAGNOSIS — J1282 Pneumonia due to coronavirus disease 2019: Secondary | ICD-10-CM | POA: Diagnosis present

## 2021-08-27 DIAGNOSIS — R7303 Prediabetes: Secondary | ICD-10-CM | POA: Diagnosis present

## 2021-08-27 DIAGNOSIS — I480 Paroxysmal atrial fibrillation: Secondary | ICD-10-CM | POA: Diagnosis present

## 2021-08-27 DIAGNOSIS — N1832 Chronic kidney disease, stage 3b: Secondary | ICD-10-CM | POA: Diagnosis not present

## 2021-08-27 DIAGNOSIS — M6281 Muscle weakness (generalized): Secondary | ICD-10-CM | POA: Diagnosis not present

## 2021-08-27 DIAGNOSIS — I5032 Chronic diastolic (congestive) heart failure: Secondary | ICD-10-CM | POA: Diagnosis present

## 2021-08-27 DIAGNOSIS — I130 Hypertensive heart and chronic kidney disease with heart failure and stage 1 through stage 4 chronic kidney disease, or unspecified chronic kidney disease: Secondary | ICD-10-CM | POA: Diagnosis not present

## 2021-08-27 DIAGNOSIS — Z87442 Personal history of urinary calculi: Secondary | ICD-10-CM

## 2021-08-27 DIAGNOSIS — U071 COVID-19: Principal | ICD-10-CM | POA: Diagnosis present

## 2021-08-27 DIAGNOSIS — F419 Anxiety disorder, unspecified: Secondary | ICD-10-CM | POA: Diagnosis present

## 2021-08-27 DIAGNOSIS — Z880 Allergy status to penicillin: Secondary | ICD-10-CM | POA: Diagnosis not present

## 2021-08-27 DIAGNOSIS — K219 Gastro-esophageal reflux disease without esophagitis: Secondary | ICD-10-CM | POA: Diagnosis not present

## 2021-08-27 DIAGNOSIS — R011 Cardiac murmur, unspecified: Secondary | ICD-10-CM | POA: Diagnosis present

## 2021-08-27 DIAGNOSIS — Z66 Do not resuscitate: Secondary | ICD-10-CM | POA: Diagnosis present

## 2021-08-27 DIAGNOSIS — R531 Weakness: Secondary | ICD-10-CM | POA: Diagnosis not present

## 2021-08-27 DIAGNOSIS — R739 Hyperglycemia, unspecified: Secondary | ICD-10-CM | POA: Diagnosis not present

## 2021-08-27 DIAGNOSIS — R279 Unspecified lack of coordination: Secondary | ICD-10-CM | POA: Diagnosis not present

## 2021-08-27 DIAGNOSIS — Z823 Family history of stroke: Secondary | ICD-10-CM

## 2021-08-27 DIAGNOSIS — Z96651 Presence of right artificial knee joint: Secondary | ICD-10-CM | POA: Diagnosis present

## 2021-08-27 DIAGNOSIS — Z882 Allergy status to sulfonamides status: Secondary | ICD-10-CM

## 2021-08-27 DIAGNOSIS — I10 Essential (primary) hypertension: Secondary | ICD-10-CM

## 2021-08-27 DIAGNOSIS — Z885 Allergy status to narcotic agent status: Secondary | ICD-10-CM | POA: Diagnosis not present

## 2021-08-27 DIAGNOSIS — E876 Hypokalemia: Secondary | ICD-10-CM | POA: Diagnosis not present

## 2021-08-27 DIAGNOSIS — N183 Chronic kidney disease, stage 3 unspecified: Secondary | ICD-10-CM | POA: Diagnosis present

## 2021-08-27 DIAGNOSIS — Z7901 Long term (current) use of anticoagulants: Secondary | ICD-10-CM

## 2021-08-27 DIAGNOSIS — Z743 Need for continuous supervision: Secondary | ICD-10-CM | POA: Diagnosis not present

## 2021-08-27 DIAGNOSIS — F32A Depression, unspecified: Secondary | ICD-10-CM | POA: Diagnosis present

## 2021-08-27 DIAGNOSIS — Z8673 Personal history of transient ischemic attack (TIA), and cerebral infarction without residual deficits: Secondary | ICD-10-CM

## 2021-08-27 DIAGNOSIS — R059 Cough, unspecified: Secondary | ICD-10-CM | POA: Diagnosis not present

## 2021-08-27 DIAGNOSIS — M1990 Unspecified osteoarthritis, unspecified site: Secondary | ICD-10-CM | POA: Diagnosis not present

## 2021-08-27 DIAGNOSIS — Z974 Presence of external hearing-aid: Secondary | ICD-10-CM

## 2021-08-27 DIAGNOSIS — Z7401 Bed confinement status: Secondary | ICD-10-CM | POA: Diagnosis not present

## 2021-08-27 DIAGNOSIS — R262 Difficulty in walking, not elsewhere classified: Secondary | ICD-10-CM | POA: Diagnosis not present

## 2021-08-27 DIAGNOSIS — R0602 Shortness of breath: Secondary | ICD-10-CM | POA: Diagnosis not present

## 2021-08-27 DIAGNOSIS — R5381 Other malaise: Secondary | ICD-10-CM | POA: Diagnosis not present

## 2021-08-27 DIAGNOSIS — J9601 Acute respiratory failure with hypoxia: Secondary | ICD-10-CM | POA: Diagnosis not present

## 2021-08-27 DIAGNOSIS — J9621 Acute and chronic respiratory failure with hypoxia: Secondary | ICD-10-CM | POA: Diagnosis present

## 2021-08-27 DIAGNOSIS — R2689 Other abnormalities of gait and mobility: Secondary | ICD-10-CM | POA: Diagnosis not present

## 2021-08-27 DIAGNOSIS — Z79899 Other long term (current) drug therapy: Secondary | ICD-10-CM

## 2021-08-27 DIAGNOSIS — I482 Chronic atrial fibrillation, unspecified: Secondary | ICD-10-CM | POA: Diagnosis present

## 2021-08-27 LAB — URINALYSIS, ROUTINE W REFLEX MICROSCOPIC
Bilirubin Urine: NEGATIVE
Glucose, UA: NEGATIVE mg/dL
Hgb urine dipstick: NEGATIVE
Ketones, ur: NEGATIVE mg/dL
Leukocytes,Ua: NEGATIVE
Nitrite: NEGATIVE
Protein, ur: NEGATIVE mg/dL
Specific Gravity, Urine: 1.015 (ref 1.005–1.030)
pH: 6.5 (ref 5.0–8.0)

## 2021-08-27 LAB — LACTIC ACID, PLASMA
Lactic Acid, Venous: 2 mmol/L (ref 0.5–1.9)
Lactic Acid, Venous: 1.5 mmol/L (ref 0.5–1.9)

## 2021-08-27 LAB — COMPREHENSIVE METABOLIC PANEL
ALT: 10 U/L (ref 0–44)
AST: 19 U/L (ref 15–41)
Albumin: 3.3 g/dL — ABNORMAL LOW (ref 3.5–5.0)
Alkaline Phosphatase: 90 U/L (ref 38–126)
Anion gap: 12 (ref 5–15)
BUN: 16 mg/dL (ref 8–23)
CO2: 30 mmol/L (ref 22–32)
Calcium: 9.8 mg/dL (ref 8.9–10.3)
Chloride: 97 mmol/L — ABNORMAL LOW (ref 98–111)
Creatinine, Ser: 1.04 mg/dL — ABNORMAL HIGH (ref 0.44–1.00)
GFR, Estimated: 51 mL/min — ABNORMAL LOW (ref >60)
Glucose, Bld: 128 mg/dL — ABNORMAL HIGH (ref 70–99)
Potassium: 3 mmol/L — ABNORMAL LOW (ref 3.5–5.1)
Sodium: 139 mmol/L (ref 135–145)
Total Bilirubin: 1 mg/dL (ref 0.3–1.2)
Total Protein: 6.8 g/dL (ref 6.5–8.1)

## 2021-08-27 LAB — CBC WITH DIFFERENTIAL/PLATELET
Abs Immature Granulocytes: 0.03 10*3/uL (ref 0.00–0.07)
Basophils Absolute: 0 10*3/uL (ref 0.0–0.1)
Basophils Relative: 0 %
Eosinophils Absolute: 0 10*3/uL (ref 0.0–0.5)
Eosinophils Relative: 1 %
HCT: 40.4 % (ref 36.0–46.0)
Hemoglobin: 12.8 g/dL (ref 12.0–15.0)
Immature Granulocytes: 0 %
Lymphocytes Relative: 12 %
Lymphs Abs: 1 10*3/uL (ref 0.7–4.0)
MCH: 31.3 pg (ref 26.0–34.0)
MCHC: 31.7 g/dL (ref 30.0–36.0)
MCV: 98.8 fL (ref 80.0–100.0)
Monocytes Absolute: 0.4 10*3/uL (ref 0.1–1.0)
Monocytes Relative: 6 %
Neutro Abs: 6.4 10*3/uL (ref 1.7–7.7)
Neutrophils Relative %: 81 %
Platelets: 204 10*3/uL (ref 150–400)
RBC: 4.09 MIL/uL (ref 3.87–5.11)
RDW: 14.5 % (ref 11.5–15.5)
WBC: 7.9 10*3/uL (ref 4.0–10.5)
nRBC: 0 % (ref 0.0–0.2)

## 2021-08-27 LAB — RESP PANEL BY RT-PCR (FLU A&B, COVID) ARPGX2
Influenza A by PCR: NEGATIVE
Influenza B by PCR: NEGATIVE
SARS Coronavirus 2 by RT PCR: POSITIVE — AB

## 2021-08-27 LAB — PROCALCITONIN: Procalcitonin: <0.1 ng/mL

## 2021-08-27 LAB — LACTATE DEHYDROGENASE: LDH: 155 U/L (ref 98–192)

## 2021-08-27 LAB — FIBRINOGEN: Fibrinogen: 385 mg/dL (ref 210–475)

## 2021-08-27 LAB — TRIGLYCERIDES: Triglycerides: 68 mg/dL (ref <150)

## 2021-08-27 LAB — BRAIN NATRIURETIC PEPTIDE: B Natriuretic Peptide: 392 pg/mL — ABNORMAL HIGH (ref 0.0–100.0)

## 2021-08-27 LAB — C-REACTIVE PROTEIN: CRP: 1.2 mg/dL — ABNORMAL HIGH (ref <1.0)

## 2021-08-27 LAB — LIPASE, BLOOD: Lipase: 26 U/L (ref 11–51)

## 2021-08-27 LAB — TROPONIN I (HIGH SENSITIVITY)
Troponin I (High Sensitivity): 25 ng/L — ABNORMAL HIGH (ref <18)
Troponin I (High Sensitivity): 28 ng/L — ABNORMAL HIGH (ref <18)

## 2021-08-27 LAB — D-DIMER, QUANTITATIVE: D-Dimer, Quant: 1.63 ug/mL-FEU — ABNORMAL HIGH (ref 0.00–0.50)

## 2021-08-27 LAB — FERRITIN: Ferritin: 143 ng/mL (ref 11–307)

## 2021-08-27 MED ORDER — IPRATROPIUM-ALBUTEROL 20-100 MCG/ACT IN AERS
1.0000 | INHALATION_SPRAY | Freq: Four times a day (QID) | RESPIRATORY_TRACT | Status: DC
  Administered 2021-08-27: 21:00:00 1 via RESPIRATORY_TRACT
  Filled 2021-08-27: qty 4

## 2021-08-27 MED ORDER — DEXAMETHASONE SODIUM PHOSPHATE 10 MG/ML IJ SOLN
10.0000 mg | Freq: Once | INTRAMUSCULAR | Status: AC
  Administered 2021-08-27: 14:00:00 10 mg via INTRAVENOUS
  Filled 2021-08-27: qty 1

## 2021-08-27 MED ORDER — TRAMADOL HCL 50 MG PO TABS
50.0000 mg | ORAL_TABLET | Freq: Four times a day (QID) | ORAL | Status: DC | PRN
  Administered 2021-08-27: 22:00:00 50 mg via ORAL
  Filled 2021-08-27: qty 1

## 2021-08-27 MED ORDER — DULOXETINE HCL 60 MG PO CPEP
60.0000 mg | ORAL_CAPSULE | Freq: Every day | ORAL | Status: DC
  Administered 2021-08-27 – 2021-09-01 (×6): 60 mg via ORAL
  Filled 2021-08-27 (×6): qty 1

## 2021-08-27 MED ORDER — SODIUM CHLORIDE 0.9 % IV SOLN
100.0000 mg | INTRAVENOUS | Status: AC
  Administered 2021-08-27 (×2): 100 mg via INTRAVENOUS
  Filled 2021-08-27 (×2): qty 20

## 2021-08-27 MED ORDER — SODIUM CHLORIDE 0.9 % IV SOLN
100.0000 mg | Freq: Every day | INTRAVENOUS | Status: AC
  Administered 2021-08-28 – 2021-08-31 (×4): 100 mg via INTRAVENOUS
  Filled 2021-08-27 (×4): qty 20

## 2021-08-27 MED ORDER — METOPROLOL TARTRATE 50 MG PO TABS
50.0000 mg | ORAL_TABLET | Freq: Two times a day (BID) | ORAL | Status: DC
  Administered 2021-08-27 – 2021-09-01 (×10): 50 mg via ORAL
  Filled 2021-08-27 (×10): qty 1

## 2021-08-27 MED ORDER — POTASSIUM CHLORIDE CRYS ER 10 MEQ PO TBCR
40.0000 meq | EXTENDED_RELEASE_TABLET | Freq: Every day | ORAL | Status: DC
  Administered 2021-08-27 – 2021-08-31 (×5): 40 meq via ORAL
  Filled 2021-08-27 (×7): qty 4

## 2021-08-27 MED ORDER — SODIUM CHLORIDE 0.9 % IV BOLUS
500.0000 mL | Freq: Once | INTRAVENOUS | Status: AC
  Administered 2021-08-27: 11:00:00 500 mL via INTRAVENOUS

## 2021-08-27 MED ORDER — ONDANSETRON HCL 4 MG/2ML IJ SOLN: 4.0000 mg | Freq: Four times a day (QID) | INTRAMUSCULAR | Status: DC | PRN

## 2021-08-27 MED ORDER — IPRATROPIUM-ALBUTEROL 20-100 MCG/ACT IN AERS
1.0000 | INHALATION_SPRAY | Freq: Two times a day (BID) | RESPIRATORY_TRACT | Status: DC
  Administered 2021-08-28 – 2021-09-01 (×10): 1 via RESPIRATORY_TRACT

## 2021-08-27 MED ORDER — SODIUM CHLORIDE 0.9 % IV SOLN: INTRAVENOUS | Status: AC

## 2021-08-27 MED ORDER — ASCORBIC ACID 500 MG PO TABS
500.0000 mg | ORAL_TABLET | Freq: Every day | ORAL | Status: DC
  Administered 2021-08-27 – 2021-09-01 (×6): 500 mg via ORAL
  Filled 2021-08-27 (×6): qty 1

## 2021-08-27 MED ORDER — HYDROCOD POLST-CPM POLST ER 10-8 MG/5ML PO SUER
5.0000 mL | Freq: Two times a day (BID) | ORAL | Status: DC | PRN
  Administered 2021-08-29: 12:00:00 5 mL via ORAL
  Filled 2021-08-27: qty 5

## 2021-08-27 MED ORDER — APIXABAN 5 MG PO TABS
5.0000 mg | ORAL_TABLET | Freq: Two times a day (BID) | ORAL | Status: DC
  Administered 2021-08-27 – 2021-09-01 (×10): 5 mg via ORAL
  Filled 2021-08-27 (×10): qty 1

## 2021-08-27 MED ORDER — POLYETHYLENE GLYCOL 3350 17 G PO PACK: 17.0000 g | PACK | Freq: Every day | ORAL | Status: DC | PRN

## 2021-08-27 MED ORDER — ATORVASTATIN CALCIUM 40 MG PO TABS
40.0000 mg | ORAL_TABLET | Freq: Every day | ORAL | Status: DC
  Administered 2021-08-27 – 2021-09-01 (×6): 40 mg via ORAL
  Filled 2021-08-27 (×6): qty 1

## 2021-08-27 MED ORDER — ISOSORBIDE MONONITRATE ER 60 MG PO TB24
30.0000 mg | ORAL_TABLET | Freq: Every day | ORAL | Status: DC
  Administered 2021-08-27 – 2021-09-01 (×6): 30 mg via ORAL
  Filled 2021-08-27 (×6): qty 1

## 2021-08-27 MED ORDER — METHYLPREDNISOLONE SODIUM SUCC 125 MG IJ SOLR
1.0000 mg/kg | Freq: Two times a day (BID) | INTRAMUSCULAR | Status: AC
  Administered 2021-08-27 – 2021-08-30 (×7): 75 mg via INTRAVENOUS
  Filled 2021-08-27 (×7): qty 2

## 2021-08-27 MED ORDER — ONDANSETRON HCL 4 MG PO TABS: 4.0000 mg | ORAL_TABLET | Freq: Four times a day (QID) | ORAL | Status: DC | PRN

## 2021-08-27 MED ORDER — HYDRALAZINE HCL 10 MG PO TABS
10.0000 mg | ORAL_TABLET | Freq: Two times a day (BID) | ORAL | Status: DC
  Administered 2021-08-27 – 2021-09-01 (×10): 10 mg via ORAL
  Filled 2021-08-27 (×9): qty 1

## 2021-08-27 MED ORDER — ZINC SULFATE 220 (50 ZN) MG PO CAPS
220.0000 mg | ORAL_CAPSULE | Freq: Every day | ORAL | Status: DC
  Administered 2021-08-27 – 2021-09-01 (×6): 220 mg via ORAL
  Filled 2021-08-27 (×6): qty 1

## 2021-08-27 MED ORDER — LORAZEPAM 0.5 MG PO TABS
0.5000 mg | ORAL_TABLET | Freq: Three times a day (TID) | ORAL | Status: DC | PRN
  Administered 2021-08-30: 18:00:00 0.5 mg via ORAL
  Filled 2021-08-27: qty 1

## 2021-08-27 MED ORDER — PREDNISONE 20 MG PO TABS
50.0000 mg | ORAL_TABLET | Freq: Every day | ORAL | Status: DC
  Administered 2021-08-31 – 2021-09-01 (×2): 50 mg via ORAL
  Filled 2021-08-27 (×2): qty 1

## 2021-08-27 MED ORDER — GUAIFENESIN-DM 100-10 MG/5ML PO SYRP
10.0000 mL | ORAL_SOLUTION | ORAL | Status: DC | PRN
  Administered 2021-08-28 – 2021-09-01 (×3): 10 mL via ORAL
  Filled 2021-08-27 (×3): qty 10

## 2021-08-27 MED ORDER — DILTIAZEM HCL ER COATED BEADS 180 MG PO CP24
180.0000 mg | ORAL_CAPSULE | Freq: Every day | ORAL | Status: DC
  Administered 2021-08-27 – 2021-09-01 (×6): 180 mg via ORAL
  Filled 2021-08-27 (×6): qty 1

## 2021-08-27 NOTE — ED Triage Notes (Signed)
Pt brought in by EMS for generalized weakness, UTI  symptoms per EMS, family concerned she may have COVID as well.  Pt alert to voice and follows commands.

## 2021-08-27 NOTE — ED Notes (Signed)
Date and time results received: 08/27/21 11:30 AM  Test: Lactic Acid Critical Value: 2.0  Name of Provider Notified:   Orders Received? Or Actions Taken?: See Orders

## 2021-08-27 NOTE — ED Notes (Signed)
Iv placed  L forearm, blood culture number one and blood drawn, blood culture number two drawn via 23 gauge butterfly from R ac on second attempt. Pt tolerated well.

## 2021-08-27 NOTE — Progress Notes (Signed)
Received admission information via phone call with patient's daughter in-law Loney Peto. [name] and Dmiyah Liscano are her primary full-time caregivers. [name] requested assistance/information from Social Work on resources for help with care at home due to the patient's decline in mobility and strength over the past year. Nursing order for SW consult to be placed.

## 2021-08-27 NOTE — H&P (Addendum)
History and Physical    Carrie Best [id number] DOB: Nov 17, 1927 DOA: 08/27/2021  PCP: Kirby FunkGriffin, John, MD   Patient coming from: Home  I have personally briefly reviewed patient's old medical [facility]  Chief Complaint: General malaise, shortness of breath and confusion.  HPI: Carrie Best is a 87 y.o. female with medical history significant of anxiety/depression, paroxysmal atrial fibrillation, hypertension, hyperlipidemia, gastroesophageal reflux disease, diastolic heart failure, chronic respiratory failure with nocturnal hypoxia using 3-4 L nasal cannula supplementation at baseline; who presented to the hospital secondary to general malaise, confusion and shortness of breath.  Patient's symptoms have been present for the last 2-3 days and worsening.  Family initially felt patient has UTI based on her presentation and prior similar symptoms with that condition.  They had noted some low-grade temperature and she is requiring up to 5 L in order to keep her saturation above 89%.  There is no chest pain, no nausea, no vomiting, no overt bleeding, new focal deficits or any other complaints.  Patient is vaccinated against COVID, but no boosted  ED Course: Chest x-ray demonstrating son lateral right hemithorax that could reflect loculated pleural fluid versus pleural mass; Potassium 3.0, ferritin 143, LDH 155, procalcitonin < 0.10, D-dimer 1.63 positive respiratory panel for COVID PCR; CRP pending.  Given hypoxia and advanced age patient has been started on steroids and remdesivir.  [facility] hospital[facility] has been called to admit to the hospital for further evaluation and management of COVID-19 pneumonia.   Review of Systems: As per HPI otherwise all other systems reviewed and are negative.  Past Medical History:  Diagnosis Date   Anxiety    Arthritis    Congestive heart failure (HCC)    Congestive heart failure symptoms   Depression    Diastolic dysfunction    Dyspnea     occasionally, with exertion   Dysrhythmia 05-10-12   hx. A. Fib., rate is controlled-tx. Xarelto   GERD (gastroesophageal reflux disease) 05-10-12   tx. with meds as needed   Hearing loss 05-10-12   bilateral hearing aids.   Heart murmur    History of kidney stones    Hyperlipidemia    Hypertension    Pneumonia 05-10-12   dx. in July- tx. antibiotics   Stroke (HCC) 05-10-12   Lt.CVA note on scans-hx. freq. falls/ none past 6 months 01/21/14   Wears hearing aid    bilateral    Past Surgical History:  Procedure Laterality Date   APPENDECTOMY     CATARACT EXTRACTION, BILATERAL  05-10-12   bilateral   CYSTOSCOPY WITH RETROGRADE PYELOGRAM, URETEROSCOPY AND STENT PLACEMENT Right 01/31/2014   Procedure: CYSTOSCOPY WITH bilateral  RETROGRADE PYELOGRAM, right URETEROSCOPY AND right  STENT PLACEMENT, bladder biopsy with fulgeration;  Surgeon: Sebastian Acheheodore Manny, MD;  Location: [facility];  Service: Urology;  Laterality: Right;   HARDWARE REMOVAL Right 09/09/2014   Procedure: HARDWARE REMOVAL RIGHT SHOULDER;  Surgeon: Mable ParisJustin William Chandler, MD;  Location: [facility];  Service: Orthopedics;  Laterality: Right;  Right shoulder hardware removal   HOLMIUM LASER APPLICATION Right 01/31/2014   Procedure: HOLMIUM LASER APPLICATION;  Surgeon: Sebastian Acheheodore Manny, MD;  Location: [facility];  Service: Urology;  Laterality: Right;   HUMERUS IM NAIL Right 06/10/2014   Procedure: INTRAMEDULLARY (IM) NAIL HUMERAL;  Surgeon: Mable ParisJustin William Chandler, MD;  Location: [facility];  Service: Orthopedics;  Laterality: Right;  Right intramedullary humeral nail   INTERSTIM IMPLANT PLACEMENT  05-10-12   now malfunctioning  INTERSTIM IMPLANT REMOVAL  05/16/2012   Procedure: REMOVAL OF INTERSTIM IMPLANT;  Surgeon: Reece Packer, MD;  Location: [facility];  Service: Urology;  Laterality: N/A;   JOINT REPLACEMENT     OTHER SURGICAL HISTORY     Bladder Surgery   SHOULDER SURGERY     TOTAL KNEE ARTHROPLASTY Right 02/17/2015   Procedure:  TOTAL KNEE ARTHROPLASTY;  Surgeon: Frederik Pear, MD;  Location: [facility];  Service: Orthopedics;  Laterality: Right;    Social History  reports that she has never smoked. She has never used smokeless tobacco. She reports that she does not drink alcohol and does not use drugs.  Allergies  Allergen Reactions   Codeine Nausea And Vomiting   Elemental Sulfur Nausea And Vomiting   Penicillins Hives    Has patient had a PCN reaction causing immediate rash, facial/tongue/throat swelling, SOB or lightheadedness with hypotension: No Has patient had a PCN reaction causing severe rash involving mucus membranes or skin necrosis: No Has patient had a PCN reaction that required hospitalization No Has patient had a PCN reaction occurring within the last 10 years: No If all of the above answers are "NO", then may proceed with Cephalosporin use.     Family History  Problem Relation Age of Onset   Stroke Mother    Stroke Brother     Prior to Admission medications   Medication Sig Start Date End Date Taking? Authorizing Provider  apixaban (ELIQUIS) 5 MG TABS tablet Take 1 tablet (5 mg total) by mouth 2 (two) times daily. 07/27/20  Yes Emokpae, Courage, MD  atorvastatin (LIPITOR) 40 MG tablet Take 1 tablet (40 mg total) by mouth daily. 06/26/19  Yes Kayleen Memos, DO  diltiazem (CARDIZEM CD) 180 MG 24 hr capsule Take 180 mg by mouth daily.   Yes [provider]  DULoxetine (CYMBALTA) 60 MG capsule Take 60 mg by mouth daily.    Yes [provider]  hydrALAZINE (APRESOLINE) 10 MG tablet Take 1 tablet (10 mg total) by mouth 2 (two) times daily. 07/27/20 08/27/21 Yes Emokpae, Courage, MD  HYDROcodone-acetaminophen (NORCO) 5-325 MG tablet Take 1 tablet by mouth 2 (two) times daily as needed. 10/21/20  Yes Aundra Dubin, PA-C  isosorbide mononitrate (IMDUR) 30 MG 24 hr tablet TAKE 1 TABLET(30 MG) BY MOUTH DAILY 10/15/19  Yes Nahser, Wonda Cheng, MD  LORazepam (ATIVAN) 0.5 MG tablet Take 0.5 mg by  mouth 3 (three) times daily as needed. 07/16/19  Yes [provider]  metoprolol tartrate (LOPRESSOR) 50 MG tablet Take 50 mg by mouth 2 (two) times daily.   Yes [provider]  polyethylene glycol (MIRALAX / GLYCOLAX) 17 g packet Take 17 g by mouth daily. 06/26/19  Yes Kayleen Memos, DO  Potassium Chloride ER 20 MEQ TBCR Take 1 tablet by mouth daily. 07/27/20  Yes Emokpae, Courage, MD  torsemide (DEMADEX) 20 MG tablet Take 1 tablet (20 mg total) by mouth See admin instructions. Take Torsemide/Demadex 2 Tablets (40 mg) on Tuesday, Thursday, Saturday and Sunday, Take 1 Tab (20 mg) on Monday, Wednesday and Friday--- 07/27/20  Yes Emokpae, Courage, MD  zolpidem (AMBIEN) 10 MG tablet Take 10 mg by mouth at bedtime.   Yes [provider]    Physical Exam: Vitals:   08/27/21 1300 08/27/21 1315 08/27/21 1600 08/27/21 1753  BP: (!) 145/111  (!) 182/72 (!) 183/77  Pulse: 84 (!) 55 (!) 108 (!) 103  Resp: 19 19 20    Temp:  98.6 F (37 C)  TempSrc:    Oral  SpO2: (!) 89% 97% 93% 98%  Weight:      Height:        Constitutional: Chronically ill in appearance; no complaining of chest pain.  Good saturation on 5 L nasal cannula supplementation.  Currently afebrile. Vitals:   08/27/21 1300 08/27/21 1315 08/27/21 1600 08/27/21 1753  BP: (!) 145/111  (!) 182/72 (!) 183/77  Pulse: 84 (!) 55 (!) 108 (!) 103  Resp: 19 19 20    Temp:    98.6 F (37 C)  TempSrc:    Oral  SpO2: (!) 89% 97% 93% 98%  Weight:      Height:       Eyes: PERRL, lids and conjunctivae normal; no icterus, no nystagmus. ENMT: Mucous membranes dry on examination. Posterior pharynx clear of any exudate or lesions. Neck: normal, supple, no masses, no thyromegaly; no JVD. Respiratory: clear to auscultation bilaterally, no wheezing, no crackles. Normal respiratory effort. No accessory muscle use.  Cardiovascular: Rate controlled, no rubs, no gallops, no JVD on exam.  Positive systolic murmur  appreciated. Abdomen: no tenderness, no masses palpated. No hepatosplenomegaly. Bowel sounds positive.  Musculoskeletal: no clubbing / cyanosis. No joint deformity upper and lower extremities. Good ROM, no contractures. Normal muscle tone.  No lower extremity edema. Skin: no petechiae. Neurologic: CN 2-12 grossly intact.  Following commands appropriately.  No new focal deficits. Psychiatric: Normal judgment and insight.  Stable mood.  Labs on Admission: I have personally reviewed following labs and imaging studies  CBC: Recent Labs  Lab 08/27/21 1020  WBC 7.9  NEUTROABS 6.4  HGB 12.8  HCT 40.4  MCV 98.8  PLT 204    Basic Metabolic Panel: Recent Labs  Lab 08/27/21 1020  NA 139  K 3.0*  CL 97*  CO2 30  GLUCOSE 128*  BUN 16  CREATININE 1.04*  CALCIUM 9.8    GFR: Estimated Creatinine Clearance: 35.5 mL/min (A) (by C-G formula based on SCr of 1.04 mg/dL (H)).  Liver Function Tests: Recent Labs  Lab 08/27/21 1020  AST 19  ALT 10  ALKPHOS 90  BILITOT 1.0  PROT 6.8  ALBUMIN 3.3*    Urine analysis:    Component Value Date/Time   COLORURINE YELLOW 08/27/2021 1412   APPEARANCEUR CLEAR 08/27/2021 1412   LABSPEC 1.015 08/27/2021 1412   PHURINE 6.5 08/27/2021 1412   GLUCOSEU NEGATIVE 08/27/2021 1412   HGBUR NEGATIVE 08/27/2021 1412   BILIRUBINUR NEGATIVE 08/27/2021 1412   KETONESUR NEGATIVE 08/27/2021 1412   PROTEINUR NEGATIVE 08/27/2021 1412   UROBILINOGEN 0.2 02/07/2015 1534   NITRITE NEGATIVE 08/27/2021 1412   LEUKOCYTESUR NEGATIVE 08/27/2021 1412    Radiological Exams on Admission: DG Chest Port 1 View  Result Date: 08/27/2021 CLINICAL DATA:  Weakness, cough, shortness of breath EXAM: PORTABLE CHEST 1 VIEW COMPARISON:  Chest radiograph 06/16/2021 FINDINGS: The cardiomediastinal silhouette is within normal limits. There is density projecting over the lateral right hemithorax which may reflect an extrapleural mass or loculated pleural fluid. There is hazy  density in the adjacent right lower lobe. The left lung is clear. There is no left effusion. There is no pneumothorax. Postsurgical changes are seen in the right humerus. IMPRESSION: Density projecting over the lateral right hemithorax may reflect loculated pleural fluid or a pleural-based mass. There is hazy opacity in the adjacent right lower lobe. Recommend CT chest (preferably with contrast) for further evaluation. Electronically Signed   By: 08/16/2021.D.  On: 08/27/2021 12:12    EKG: Independently reviewed.  No acute ischemic changes.  Assessment/Plan 1-COVID-19 virus infection/COVID-19 pneumonia -Patient high risk for decompensation given underlying and chronic problems. -Continue oxygen supplementation and wean down to baseline as tolerated (normally uses 3-4 L nasal cannula supplementation). -Continue treatment with steroids and remdesivir -As needed bronchodilators and flutter valve. -As needed mucolytic/antitussive regimen. -Vitamin C and zinc will be provided. -Follow inflammatory markers. -Gentle fluid resuscitation to be provided over the Hours.  2-hypokalemia -Will replete and follow electrolytes trend -Check magnesium level and phosphorus level. -Holding Demadex overnight.  3-chronic diastolic heart failure -Currently compensated, on examination physical findings suggesting component of dehydration -Follow daily weights, strict intake/output and low-sodium diet. -Holding diuretics overnight.  4-paroxysmal atrial fibrillation on chronic anticoagulation -Resume the use of beta-blocker and Cardizem -Continue Eliquis -Follow on telemetry.  5-chronic renal failure stage IIIb -Creatinine close to her baseline at this moment -Minimize the use of nephrotoxic agents, hypotension and contrast as much as possible. -Gentle fluid resuscitation will be provided -Follow renal function trend and stability.  6-essential hypertension -Resume home antihypertensive  agent -Heart healthy diet/low-sodium has been ordered.  7-gastroesophageal disease -Continue PPI.  8-hyperlipidemia -Continue statins.  9-acute on chronic respiratory failure with hypoxia in the setting of problem #1 -Please see above for treatment -Wean off oxygen supplementation as tolerated to baseline.  DVT prophylaxis: Chronically on Eliquis Code Status:   Full code Family Communication:  No family at bedside. Disposition Plan:   Patient is from:  Home  Anticipated DC to:  To be determined  Anticipated DC date:  3-4 days  Anticipated DC barriers: Stabilization of her  respiratory status and improvement in her symptoms.  Actively receiving management for COVID infection with IV infusions (steroids/remdesivir).  Consults called:  None  Admission status:  Inpatient, length of stay more than 2 midnights; telemetry bed.  Severity of Illness: The appropriate patient status for this patient is INPATIENT. Inpatient status is judged to be reasonable and necessary in order to provide the required intensity of service to ensure the patient's safety. The patient's presenting symptoms, physical exam findings, and initial radiographic and laboratory data in the context of their chronic comorbidities is felt to place them at high risk for further clinical deterioration. Furthermore, it is not anticipated that the patient will be medically stable for discharge from the hospital within 2 midnights of admission.   * I certify that at the point of admission it is my clinical judgment that the patient will require inpatient hospital care spanning beyond 2 midnights from the point of admission due to high intensity of service, high risk for further deterioration and high frequency of surveillance required.Barton Dubois MD [facility]  How to contact the [facility] Attending or Consulting provider [place] or covering provider during after hours [place], for this patient?   Check the care team  in [facility] and look for a) attending/consulting [facility] provider listed and b) the [facility] team listed Log into [email or web address] and use [place]'s universal password to access. If you do not have the password, please contact the hospital operator. Locate the [facility] provider you are looking for under [facility] and page to a number that you can be directly reached. If you still have difficulty reaching the provider, please page the [facility] (Director on Call) for the Hospitalists listed on amion for assistance.  08/27/2021, 6:06 PM

## 2021-08-27 NOTE — ED Provider Notes (Signed)
Emergency Department Provider Note   I have reviewed the triage vital signs and the nursing notes.   HISTORY  Chief Complaint Weakness   HPI Carrie Best is a 87 y.o. female with past medical history reviewed below including congestive heart failure on home O2 presents to the emergency department by EMS with shortness of breath with generalized weakness and confusion.  Patient was at home with her son.  EMS report some clinical concern for urinary tract infection with her weakness but patient denies any dysuria, hesitancy, urgency.  She tells me she is feeling weak.  She denies any chest pain or pressure currently.  She has had some chills.  Denies vomiting or diarrhea.  Some report from family to the effect of patient being altered and so unclear how reliable the history is currently. Level 5 caveat: AMS    Past Medical History:  Diagnosis Date   Anxiety    Arthritis    Congestive heart failure (HCC)    Congestive heart failure symptoms   Depression    Diastolic dysfunction    Dyspnea    occasionally, with exertion   Dysrhythmia 05-10-12   hx. A. Fib., rate is controlled-tx. Xarelto   GERD (gastroesophageal reflux disease) 05-10-12   tx. with meds as needed   Hearing loss 05-10-12   bilateral hearing aids.   Heart murmur    History of kidney stones    Hyperlipidemia    Hypertension    Pneumonia 05-10-12   dx. in July- tx. antibiotics   Stroke ([place]) 05-10-12   Lt.CVA note on scans-hx. freq. falls/ none past 6 months 01/21/14   Wears hearing aid    bilateral    Patient Active Problem List   Diagnosis Date Noted   COVID-19 virus infection 08/27/2021   Acute on chronic heart failure with preserved ejection fraction (HFpEF) ([place]) 07/25/2020   Acute exacerbation of CHF (congestive heart failure) ([place]) 07/24/2020   Constipation 07/24/2020   Leukocytosis 07/24/2020   Elevated brain natriuretic peptide (BNP) level 07/24/2020   CAP (community acquired pneumonia) 07/24/2020    OSA (obstructive sleep apnea) 07/10/2020   Nocturnal hypoxia 07/10/2020   Acute confusion 07/10/2020   CVA (cerebral vascular accident) ([place]) 06/19/2019   Acute encephalopathy 06/18/2019   Acute respiratory failure with hypoxia ([place]) 09/10/2018   Chronic kidney disease (CKD), stage II (mild) 09/10/2018   Amyloidosis of bladder ([place]) 09/10/2018   CHF exacerbation ([place]) 09/09/2018   ARF (acute renal failure) ([place]) 07/10/2018   Nausea & vomiting 07/09/2018   Hypokalemia 12/25/2017   Acute on chronic diastolic CHF (congestive heart failure) ([place]) 12/24/2017   Failed total knee arthroplasty ([place]) 05/19/2016   Rupture of right patellar tendon 05/19/2016   H/O nonunion of fracture 03/16/2016   Arthritis of knee 02/17/2015   Primary osteoarthritis of right knee Valgus 02/14/2015   Chronic diastolic CHF (congestive heart failure) ([place]) 02/06/2015   Proximal humerus fracture 06/10/2014   Obesity 03/18/2012   HTN (hypertension) 03/18/2012   GERD (gastroesophageal reflux disease) 03/18/2012   Hyperlipidemia with target LDL less than 70 03/18/2012   Sleep disturbance 03/18/2012   Atrial fibrillation ([place]) 03/18/2012    Past Surgical History:  Procedure Laterality Date   APPENDECTOMY     CATARACT EXTRACTION, BILATERAL  05-10-12   bilateral   CYSTOSCOPY WITH RETROGRADE PYELOGRAM, URETEROSCOPY AND STENT PLACEMENT Right 01/31/2014   Procedure: CYSTOSCOPY WITH bilateral  RETROGRADE PYELOGRAM, right URETEROSCOPY AND right  STENT PLACEMENT, bladder biopsy with fulgeration;  Surgeon: Hubbard Robinson  Tresa Moore, MD;  Location: [facility];  Service: Urology;  Laterality: Right;   HARDWARE REMOVAL Right 09/09/2014   Procedure: HARDWARE REMOVAL RIGHT SHOULDER;  Surgeon: Nita Sells, MD;  Location: [place];  Service: Orthopedics;  Laterality: Right;  Right shoulder hardware removal   HOLMIUM LASER APPLICATION Right [id number]   Procedure: HOLMIUM LASER APPLICATION;  Surgeon: Alexis Frock, MD;   Location: [facility];  Service: Urology;  Laterality: Right;   HUMERUS IM NAIL Right 06/10/2014   Procedure: INTRAMEDULLARY (IM) NAIL HUMERAL;  Surgeon: Nita Sells, MD;  Location: [place];  Service: Orthopedics;  Laterality: Right;  Right intramedullary humeral nail   INTERSTIM IMPLANT PLACEMENT  05-10-12   now malfunctioning   INTERSTIM IMPLANT REMOVAL  05/16/2012   Procedure: REMOVAL OF INTERSTIM IMPLANT;  Surgeon: Reece Packer, MD;  Location: [facility];  Service: Urology;  Laterality: N/A;   JOINT REPLACEMENT     OTHER SURGICAL HISTORY     Bladder Surgery   SHOULDER SURGERY     TOTAL KNEE ARTHROPLASTY Right 02/17/2015   Procedure: TOTAL KNEE ARTHROPLASTY;  Surgeon: Frederik Pear, MD;  Location: [place];  Service: Orthopedics;  Laterality: Right;    Allergies Codeine, Elemental sulfur, and Penicillins  Family History  Problem Relation Age of Onset   Stroke Mother    Stroke Brother     Social History Social History   Tobacco Use   Smoking status: Never   Smokeless tobacco: Never  Vaping Use   Vaping Use: Never used  Substance Use Topics   Alcohol use: No   Drug use: No    Review of Systems  Constitutional: No fever. Positive occasional chills and weakness.  Eyes: No visual changes. ENT: No sore throat. Cardiovascular: Denies chest pain. Respiratory: Mild shortness of breath. Gastrointestinal: No abdominal pain.  No nausea, no vomiting.  No diarrhea.  No constipation. Genitourinary: Negative for dysuria. Musculoskeletal: Negative for back pain. Skin: Negative for rash. Neurological: Negative for headaches, focal weakness or numbness.  10-point ROS otherwise negative.  ____________________________________________   PHYSICAL EXAM:  VITAL SIGNS: ED Triage Vitals  Enc Vitals Group     BP 08/27/21 1009 (!) 162/89     Pulse Rate 08/27/21 1005 95     Resp 08/27/21 1005 18     Temp 08/27/21 1005 97.9 F (36.6 C)     Temp Source 08/27/21 1005 Oral     SpO2  08/27/21 1005 96 %     Weight 08/27/21 1007 165 lb 5.5 oz (75 kg)     Height 08/27/21 1007 5\' 8"  (1.727 m)    Constitutional: Alert. Conversational but HOH. No acute distress.  Eyes: Conjunctivae are normal.  Head: Atraumatic. Nose: No congestion/rhinnorhea. Mouth/Throat: Mucous membranes are moist.  Neck: No stridor.   Cardiovascular: Normal rate, regular rhythm. Good peripheral circulation. Grossly normal heart sounds.   Respiratory: Slight increased respiratory effort. No retractions. Lungs with crackles at the bases. No wheezing.  Gastrointestinal: Soft and nontender. No distention.  Musculoskeletal: No lower extremity tenderness nor edema. No gross deformities of extremities. Neurologic:  Normal speech and language. No gross focal neurologic deficits are appreciated.  Skin:  Skin is warm, dry and intact. No rash noted.   ____________________________________________   LABS (all labs ordered are listed, but only abnormal results are displayed)  Labs Reviewed  RESP PANEL BY RT-PCR (FLU A&B, COVID) ARPGX2 - Abnormal; Notable for the following components:      Result Value   SARS Coronavirus  2 by RT PCR POSITIVE (*)    All other components within normal limits  COMPREHENSIVE METABOLIC PANEL - Abnormal; Notable for the following components:   Potassium 3.0 (*)    Chloride 97 (*)    Glucose, Bld 128 (*)    Creatinine, Ser 1.04 (*)    Albumin 3.3 (*)    GFR, Estimated 51 (*)    All other components within normal limits  LACTIC ACID, PLASMA - Abnormal; Notable for the following components:   Lactic Acid, Venous 2.0 (*)    All other components within normal limits  BRAIN NATRIURETIC PEPTIDE - Abnormal; Notable for the following components:   B Natriuretic Peptide 392.0 (*)    All other components within normal limits  TROPONIN I (HIGH SENSITIVITY) - Abnormal; Notable for the following components:   Troponin I (High Sensitivity) 25 (*)    All other components within normal  limits  URINE CULTURE  CULTURE, BLOOD (ROUTINE X 2)  CULTURE, BLOOD (ROUTINE X 2)  LACTIC ACID, PLASMA  LIPASE, BLOOD  CBC WITH DIFFERENTIAL/PLATELET  URINALYSIS, ROUTINE W REFLEX MICROSCOPIC  D-DIMER, QUANTITATIVE  PROCALCITONIN  LACTATE DEHYDROGENASE  FERRITIN  TRIGLYCERIDES  FIBRINOGEN  C-REACTIVE PROTEIN  TROPONIN I (HIGH SENSITIVITY)   ____________________________________________  EKG   EKG Interpretation  Date/Time:  Thursday August 27 2021 11:05:47 EST Ventricular Rate:  87 PR Interval:    QRS Duration: 141 QT Interval:  395 QTC Calculation: 476 R Axis:   -50 Text Interpretation: Atrial fibrillation Left bundle branch block Confirmed by Alona BeneLong, Karine Garn [phone]([id number]) on 08/27/2021 11:59:48 AM        ____________________________________________  RADIOLOGY  DG Chest Port 1 View  Result Date: 08/27/2021 CLINICAL DATA:  Weakness, cough, shortness of breath EXAM: PORTABLE CHEST 1 VIEW COMPARISON:  Chest radiograph 06/16/2021 FINDINGS: The cardiomediastinal silhouette is within normal limits. There is density projecting over the lateral right hemithorax which may reflect an extrapleural mass or loculated pleural fluid. There is hazy density in the adjacent right lower lobe. The left lung is clear. There is no left effusion. There is no pneumothorax. Postsurgical changes are seen in the right humerus. IMPRESSION: Density projecting over the lateral right hemithorax may reflect loculated pleural fluid or a pleural-based mass. There is hazy opacity in the adjacent right lower lobe. Recommend CT chest (preferably with contrast) for further evaluation. Electronically Signed   By: Lesia HausenPeter  Noone M.D.   On: 08/27/2021 12:12    ____________________________________________   PROCEDURES  Procedure(s) performed:   Procedures  CRITICAL CARE Performed by: Maia PlanJoshua G Homero Hyson Total critical care time: 35 minutes Critical care time was exclusive of separately billable procedures and  treating other patients. Critical care was necessary to treat or prevent imminent or life-threatening deterioration. Critical care was time spent personally by me on the following activities: development of treatment plan with patient and/or surrogate as well as nursing, discussions with consultants, evaluation of patient's response to treatment, examination of patient, obtaining history from patient or surrogate, ordering and performing treatments and interventions, ordering and review of laboratory studies, ordering and review of radiographic studies, pulse oximetry and re-evaluation of patient's condition.  Alona BeneJoshua Mackenzy Grumbine, MD Emergency Medicine  ____________________________________________   INITIAL IMPRESSION / ASSESSMENT AND PLAN / ED COURSE  Pertinent labs & imaging results that were available during my care of the patient were reviewed by me and considered in my medical decision making (see chart for details).   Patient presents emergency room with generalized weakness, shortness of breath, increased O2  requirement here, and report from family of some confusion.  Differential is broad and includes acute infection such as viral process, bacterial pneumonia, CHF exacerbation, toxic/metabolic derangement.   Patient's lab work here shows no leukocytosis.  Her COVID has come back positive which may explain some of her increased oxygen requirement as well as fatigue and perhaps her altered mental status.  She has mild troponin elevation with slight elevated BNP but on exam does not appear acutely volume overloaded.  She has some mild hypokalemia which I do plan to replace.   Chest x-ray shows opacity with question of loculated effusion. COVID is positive. Will wait on D-dimer to order CT chest to see if angio would be indicated or not.   Discussed patient's case with [facility] to request admission. Patient and family (if present) updated with plan. Care transferred to [facility] service.  I reviewed all  nursing notes, vitals, pertinent old records, EKGs, labs, imaging (as available).    ____________________________________________  FINAL CLINICAL IMPRESSION(S) / ED DIAGNOSES  Final diagnoses:  Acute respiratory failure with hypoxia (HCC)  COVID-19     MEDICATIONS GIVEN DURING THIS VISIT:  Medications  remdesivir 100 mg in sodium chloride 0.9 % 100 mL IVPB (0 mg Intravenous Stopped 08/27/21 1420)    Followed by  remdesivir 100 mg in sodium chloride 0.9 % 100 mL IVPB (has no administration in time range)  apixaban (ELIQUIS) tablet 5 mg (has no administration in time range)  sodium chloride 0.9 % bolus 500 mL (0 mLs Intravenous Stopped 08/27/21 1150)  dexamethasone (DECADRON) injection 10 mg (10 mg Intravenous Given 08/27/21 1336)    Note:  This document was prepared using Dragon voice recognition software and may include unintentional dictation errors.  Alona Bene, MD, [facility] Emergency Medicine    Arron Mcnaught, Arlyss Repress, MD 08/28/21 [phone]

## 2021-08-28 DIAGNOSIS — R531 Weakness: Secondary | ICD-10-CM

## 2021-08-28 DIAGNOSIS — I5032 Chronic diastolic (congestive) heart failure: Secondary | ICD-10-CM

## 2021-08-28 LAB — COMPREHENSIVE METABOLIC PANEL
ALT: 11 U/L (ref 0–44)
AST: 17 U/L (ref 15–41)
Albumin: 3 g/dL — ABNORMAL LOW (ref 3.5–5.0)
Alkaline Phosphatase: 78 U/L (ref 38–126)
Anion gap: 8 (ref 5–15)
BUN: 19 mg/dL (ref 8–23)
CO2: 32 mmol/L (ref 22–32)
Calcium: 9.8 mg/dL (ref 8.9–10.3)
Chloride: 98 mmol/L (ref 98–111)
Creatinine, Ser: 0.82 mg/dL (ref 0.44–1.00)
GFR, Estimated: >60 mL/min (ref >60)
Glucose, Bld: 153 mg/dL — ABNORMAL HIGH (ref 70–99)
Potassium: 4.2 mmol/L (ref 3.5–5.1)
Sodium: 138 mmol/L (ref 135–145)
Total Bilirubin: 1 mg/dL (ref 0.3–1.2)
Total Protein: 6 g/dL — ABNORMAL LOW (ref 6.5–8.1)

## 2021-08-28 LAB — CBC WITH DIFFERENTIAL/PLATELET
Abs Immature Granulocytes: 0.03 10*3/uL (ref 0.00–0.07)
Basophils Absolute: 0 10*3/uL (ref 0.0–0.1)
Basophils Relative: 0 %
Eosinophils Absolute: 0 10*3/uL (ref 0.0–0.5)
Eosinophils Relative: 0 %
HCT: 34 % — ABNORMAL LOW (ref 36.0–46.0)
Hemoglobin: 11.1 g/dL — ABNORMAL LOW (ref 12.0–15.0)
Immature Granulocytes: 1 %
Lymphocytes Relative: 14 %
Lymphs Abs: 0.7 10*3/uL (ref 0.7–4.0)
MCH: 31.5 pg (ref 26.0–34.0)
MCHC: 32.6 g/dL (ref 30.0–36.0)
MCV: 96.6 fL (ref 80.0–100.0)
Monocytes Absolute: 0.1 10*3/uL (ref 0.1–1.0)
Monocytes Relative: 2 %
Neutro Abs: 4.4 10*3/uL (ref 1.7–7.7)
Neutrophils Relative %: 83 %
Platelets: 213 10*3/uL (ref 150–400)
RBC: 3.52 MIL/uL — ABNORMAL LOW (ref 3.87–5.11)
RDW: 14.1 % (ref 11.5–15.5)
WBC: 5.3 10*3/uL (ref 4.0–10.5)
nRBC: 0 % (ref 0.0–0.2)

## 2021-08-28 LAB — D-DIMER, QUANTITATIVE: D-Dimer, Quant: 1.61 ug/mL-FEU — ABNORMAL HIGH (ref 0.00–0.50)

## 2021-08-28 LAB — FERRITIN: Ferritin: 140 ng/mL (ref 11–307)

## 2021-08-28 LAB — PHOSPHORUS: Phosphorus: 2.7 mg/dL (ref 2.5–4.6)

## 2021-08-28 LAB — MAGNESIUM: Magnesium: 2.1 mg/dL (ref 1.7–2.4)

## 2021-08-28 LAB — C-REACTIVE PROTEIN: CRP: 1.5 mg/dL — ABNORMAL HIGH (ref <1.0)

## 2021-08-28 NOTE — Plan of Care (Signed)
°  Problem: Acute Rehab PT Goals(only PT should resolve) Goal: Pt will Roll Supine to Side Outcome: Progressing Flowsheets (Taken 08/28/2021 1257) Pt will Roll Supine to Side: with min assist Goal: Pt Will Go Supine/Side To Sit Outcome: Progressing Flowsheets (Taken 08/28/2021 1257) Pt will go Supine/Side to Sit:  with minimal assist  with HOB elevated Goal: Pt Will Go Sit To Supine/Side Outcome: Progressing Flowsheets (Taken 08/28/2021 1257) Pt will go Sit to Supine/Side:  with minimal assist  with HOB  elevated Goal: Patient Will Transfer Sit To/From Stand Outcome: Progressing Flowsheets (Taken 08/28/2021 1257) Patient will transfer sit to/from stand:  with minimal assist  from elevated surface Goal: Pt Will Transfer Bed To Chair/Chair To Bed Outcome: Progressing Flowsheets (Taken 08/28/2021 1257) Pt will Transfer Bed to Chair/Chair to Bed: with min assist Goal: Pt/caregiver will Perform Home Exercise Program Outcome: Progressing Flowsheets (Taken 08/28/2021 1257) Pt/caregiver will Perform Home Exercise Program:  For increased strengthening  For improved balance  With minimal assist   Britta Mccreedy D. Hartnett-Rands, MS, PT [facility] [place] [facility] [phone] 08/28/2021

## 2021-08-28 NOTE — Progress Notes (Signed)
Attempted to call patient's family via phone, got voicemail. Wrote their number on patient's white board so she would have it to try again.

## 2021-08-28 NOTE — Plan of Care (Signed)

## 2021-08-28 NOTE — Evaluation (Signed)
Physical Therapy Evaluation Patient Details Name: Carrie Best MRN: [id number] DOB: 11/27/1927 Today's Date: 08/28/2021  History of Present Illness  Carrie Best is a 87 y.o. female with medical history significant of anxiety/depression, paroxysmal atrial fibrillation, hypertension, hyperlipidemia, gastroesophageal reflux disease, diastolic heart failure, chronic respiratory failure with nocturnal hypoxia using 3-4 L nasal cannula supplementation at baseline; who presented to the hospital secondary to general malaise, confusion and shortness of breath.  Patient's symptoms have been present for the last 2-3 days and worsening.  Family initially felt patient has UTI based on her presentation and prior similar symptoms with that condition.  They had noted some low-grade temperature and she is requiring up to 5 L in order to keep her saturation above 89%.  There is no chest pain, no nausea, no vomiting, no overt bleeding, new focal deficits or any other complaints.   Clinical Impression  Patient lying in bed upon entry. Patient agreeable to participating in evaluation today but very hard of hearing. PT called home phone number and spoke with daughter-in-law, Gavin Pound. She reports patient has been requiring more and more assistance for the past few months. Gavin Pound provided most of the subjective information. Upon evaluation, patient requires min to mod assist for all mobility. Ambulation was not assessed this date. Prior to admission, it was reported patient used to perform stand pivot transfers from bed to wheelchair with very minimal assistance, largely for safety. Today, patient required moderate assistance.  Patient would continue to benefit from skilled physical therapy in current environment and next venue to continue return to prior function and increase strength, endurance, balance, coordination, and functional mobility and gait skills.       Recommendations for follow up therapy are one  component of a multi-disciplinary discharge planning process, led by the attending physician.  Recommendations may be updated based on patient status, additional functional criteria and insurance authorization.  Follow Up Recommendations Skilled nursing-short term rehab (<3 hours/day)    Assistance Recommended at Discharge Frequent or constant Supervision/Assistance  Functional Status Assessment Patient has had a recent decline in their functional status and demonstrates the ability to make significant improvements in function in a reasonable and predictable amount of time.  Equipment Recommendations  None recommended by PT    Recommendations for Other Services       Precautions / Restrictions Precautions Precautions: Fall Precaution Comments: no falls in last six months Restrictions Weight Bearing Restrictions: No      Mobility  Bed Mobility Overal bed mobility: Needs Assistance Bed Mobility: Supine to Sit;Sit to Supine     Supine to sit: Min assist;HOB elevated Sit to supine: Mod assist;HOB elevated   General bed mobility comments: for trunk and lower extremities    Transfers Overall transfer level: Needs assistance Equipment used: 1 person hand held assist Transfers: Sit to/from Stand Sit to Stand: Mod assist      Ambulation/Gait     General Gait Details: not attempted this date  Stairs      Wheelchair Mobility    Modified Rankin (Stroke Patients Only)       Balance Overall balance assessment: Needs assistance Sitting-balance support: Bilateral upper extremity supported;Feet supported Sitting balance-Leahy Scale: Fair     Standing balance support: Bilateral upper extremity supported;During functional activity Standing balance-Leahy Scale: Poor Standing balance comment: fair/poor with both hands held         Pertinent Vitals/Pain Pain Assessment: No/denies pain    Home Living Family/patient expects to be discharged to:: Skilled nursing  facility Living Arrangements: Children Available Help at Discharge: Family;Available 24 hours/day (daughter-in-law, Neoma Laming, and son) Type of Home: House Home Access: Stairs to enter;Ramped entrance       Home Layout: Two level;Able to live on main level with bedroom/bathroom Home Equipment: Shower seat;Rolling Walker (2 wheels);BSC/3in1;Grab bars - tub/shower;Grab bars - toilet;Transport chair Additional Comments: used legs to propel transport chair in home    Prior Function Prior Level of Function : Needs assist       Physical Assist : Mobility (physical);ADLs (physical) Mobility (physical): Transfers;Gait;Stairs ADLs (physical): Grooming;Bathing;Dressing;Toileting;IADLs Mobility Comments: Neoma Laming reports patient now requiring assist from her and husband to complete transfers; patient reports she hasn't walked in a few years       Hand Dominance        Extremity/Trunk Assessment   Upper Extremity Assessment Upper Extremity Assessment: Generalized weakness    Lower Extremity Assessment Lower Extremity Assessment: Generalized weakness    Cervical / Trunk Assessment Cervical / Trunk Assessment: Kyphotic  Communication   Communication: HOH;Receptive difficulties  Cognition Arousal/Alertness: Awake/alert Behavior During Therapy: WFL for tasks assessed/performed Overall Cognitive Status: History of cognitive impairments - at baseline     General Comments: Neoma Laming does report recent cognitive decline        General Comments      Exercises     Assessment/Plan    PT Assessment Patient needs continued PT services  PT Problem List Decreased strength;Decreased mobility;Decreased activity tolerance;Decreased cognition;Decreased balance;Decreased knowledge of use of DME       PT Treatment Interventions DME instruction;Therapeutic exercise;Gait training;Balance training;Neuromuscular re-education;Wheelchair mobility training;Functional mobility training;Cognitive  remediation;Therapeutic activities;Patient/family education    PT Goals (Current goals can be found in the Care Plan section)  Acute Rehab PT Goals Patient Stated Goal: Rehab and then maybe back home. PT Goal Formulation: With patient/family Time For Goal Achievement: 09/11/21 Potential to Achieve Goals: Fair    Frequency Min 2X/week   Barriers to discharge           AM-PAC PT "6 Clicks" Mobility  Outcome Measure Help needed turning from your back to your side while in a flat bed without using bedrails?: A Little Help needed moving from lying on your back to sitting on the side of a flat bed without using bedrails?: A Little Help needed moving to and from a bed to a chair (including a wheelchair)?: A Lot Help needed standing up from a chair using your arms (e.g., wheelchair or bedside chair)?: A Lot Help needed to walk in hospital room?: Total Help needed climbing 3-5 steps with a railing? : Total 6 Click Score: 12    End of Session   Activity Tolerance: Patient limited by fatigue Patient left: in bed;with call bell/phone within reach;with bed alarm set Nurse Communication: Mobility status PT Visit Diagnosis: Unsteadiness on feet (R26.81);Muscle weakness (generalized) (M62.81);Other abnormalities of gait and mobility (R26.89);Difficulty in walking, not elsewhere classified (R26.2)    Time: [id number] PT Time Calculation (min) (ACUTE ONLY): 30 min   Charges:   PT Evaluation $PT Eval Low Complexity: 1 Low PT Treatments $Therapeutic Activity: 8-22 mins        Floria Raveling. Hartnett-Rands, MS, PT Per [place] [phone]  Pamala Hurry  Hartnett-Rands 08/28/2021, 12:53 PM

## 2021-08-28 NOTE — [place] FL2 (Signed)
[place] MEDICAID FL2 LEVEL OF CARE SCREENING TOOL     IDENTIFICATION  Patient Name: Carrie Best Birthdate: 07-17-1928 Sex: female Admission Date (Current Location): 08/27/2021  [facility] and [place] Number:  [employer] and Address:  [facility],  [address], [place] [address]      Provider Number: [id number]  Attending Physician Name and Address:  Vassie Loll, MD  Relative Name and Phone Number:  Sentoria Brent  [phone]    Current Level of Care: Hospital Recommended Level of Care: Skilled Nursing Facility Prior Approval Number:    Date Approved/Denied:   PASRR Number: [id number]  Discharge Plan: SNF    Current Diagnoses: Patient Active Problem List   Diagnosis Date Noted   COVID-19 virus infection 08/27/2021   Chronic renal disease, stage III (HCC) 08/27/2021   Acute on chronic heart failure with preserved ejection fraction (HFpEF) (HCC) 07/25/2020   Acute exacerbation of CHF (congestive heart failure) (HCC) 07/24/2020   Constipation 07/24/2020   Leukocytosis 07/24/2020   Elevated brain natriuretic peptide (BNP) level 07/24/2020   CAP (community acquired pneumonia) 07/24/2020   OSA (obstructive sleep apnea) 07/10/2020   Nocturnal hypoxia 07/10/2020   Acute confusion 07/10/2020   CVA (cerebral vascular accident) (HCC) 06/19/2019   Acute encephalopathy 06/18/2019   Acute respiratory failure with hypoxia (HCC) 09/10/2018   Chronic kidney disease (CKD), stage II (mild) 09/10/2018   Amyloidosis of bladder (HCC) 09/10/2018   CHF exacerbation (HCC) 09/09/2018   ARF (acute renal failure) (HCC) 07/10/2018   Nausea & vomiting 07/09/2018   Hypokalemia 12/25/2017   Acute on chronic diastolic CHF (congestive heart failure) (HCC) 12/24/2017   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/19/2016   Rupture of right patellar tendon 05/19/2016   H/O nonunion of fracture 03/16/2016   Arthritis of knee 02/17/2015   Primary osteoarthritis of  right knee Valgus 02/14/2015   Chronic diastolic CHF (congestive heart failure) (HCC) 02/06/2015   Proximal humerus fracture 06/10/2014   Obesity 03/18/2012   HTN (hypertension) 03/18/2012   GERD (gastroesophageal reflux disease) 03/18/2012   Hyperlipidemia with target LDL less than 70 03/18/2012   Sleep disturbance 03/18/2012   Atrial fibrillation (HCC) 03/18/2012    Orientation RESPIRATION BLADDER Height & Weight     Self, Time, Situation, Place  O2 (2L) External catheter Weight: 70.2 kg Height:  5\' 8"  (172.7 cm)  BEHAVIORAL SYMPTOMS/MOOD NEUROLOGICAL BOWEL NUTRITION STATUS      Continent Diet (See DC summary)  AMBULATORY STATUS COMMUNICATION OF NEEDS Skin       Normal                       Personal Care Assistance Level of Assistance  Bathing, Feeding, Dressing Bathing Assistance: Maximum assistance Feeding assistance: Limited assistance Dressing Assistance: Maximum assistance     Functional Limitations Info  Sight, Hearing, Speech Sight Info: Adequate Hearing Info: Adequate Speech Info: Adequate    SPECIAL CARE FACTORS FREQUENCY  PT (By licensed PT)     PT Frequency: 5 x a week              Contractures Contractures Info: Not present    Additional Factors Info  Allergies, Code Status Code Status Info: FULL Allergies Info: Codeine , Penicillins, Elemental Sulfur           Current Medications (08/28/2021):  This is the current hospital active medication list Current Facility-Administered Medications  Medication Dose Route Frequency Provider Last Rate Last Admin   apixaban (  ELIQUIS) tablet 5 mg  5 mg Oral BID Barton Dubois, MD   5 mg at 08/28/21 [id number]   ascorbic acid (VITAMIN C) tablet 500 mg  500 mg Oral Daily Barton Dubois, MD   500 mg at 08/28/21 0854   atorvastatin (LIPITOR) tablet 40 mg  40 mg Oral Daily Barton Dubois, MD   40 mg at 08/28/21 0854   chlorpheniramine-HYDROcodone (TUSSIONEX) 10-8 MG/5ML suspension 5 mL  5 mL Oral Q12H PRN  Barton Dubois, MD       diltiazem (CARDIZEM CD) 24 hr capsule 180 mg  180 mg Oral Daily Barton Dubois, MD   180 mg at 08/28/21 0854   DULoxetine (CYMBALTA) DR capsule 60 mg  60 mg Oral Daily Barton Dubois, MD   60 mg at 08/28/21 0854   guaiFENesin-dextromethorphan (ROBITUSSIN DM) 100-10 MG/5ML syrup 10 mL  10 mL Oral Q4H PRN Barton Dubois, MD   10 mL at 08/28/21 0854   hydrALAZINE (APRESOLINE) tablet 10 mg  10 mg Oral BID Barton Dubois, MD   10 mg at 08/28/21 [id number]   Ipratropium-Albuterol (COMBIVENT) respimat 1 puff  1 puff Inhalation BID Barton Dubois, MD   1 puff at 08/28/21 [id number]   isosorbide mononitrate (IMDUR) 24 hr tablet 30 mg  30 mg Oral Daily Barton Dubois, MD   30 mg at 08/28/21 0854   LORazepam (ATIVAN) tablet 0.5 mg  0.5 mg Oral Q8H PRN Barton Dubois, MD       methylPREDNISolone sodium succinate (SOLU-MEDROL) 125 mg/2 mL injection 75 mg  1 mg/kg Intravenous Q12H Barton Dubois, MD   75 mg at 08/28/21 [id number]   Followed by   Derrill Memo ON 08/31/2021] predniSONE (DELTASONE) tablet 50 mg  50 mg Oral Daily Barton Dubois, MD       metoprolol tartrate (LOPRESSOR) tablet 50 mg  50 mg Oral BID Barton Dubois, MD   50 mg at 08/28/21 0854   ondansetron [facility]) tablet 4 mg  4 mg Oral Q6H PRN Barton Dubois, MD       Or   ondansetron [facility]) injection 4 mg  4 mg Intravenous Q6H PRN Barton Dubois, MD       polyethylene glycol (MIRALAX / GLYCOLAX) packet 17 g  17 g Oral Daily PRN Barton Dubois, MD       potassium chloride (KLOR-CON M) CR tablet 40 mEq  40 mEq Oral Daily Barton Dubois, MD   40 mEq at 08/28/21 [id number]   remdesivir 100 mg in sodium chloride 0.9 % 100 mL IVPB  100 mg Intravenous Daily Barton Dubois, MD 200 mL/hr at 08/28/21 0859 100 mg at 08/28/21 0859   traMADol (ULTRAM) tablet 50 mg  50 mg Oral Q6H PRN Barton Dubois, MD   50 mg at 08/27/21 2228   zinc sulfate capsule 220 mg  220 mg Oral Daily Barton Dubois, MD   220 mg at 08/28/21 [id number]     Discharge Medications: Please see  discharge summary for a list of discharge medications.  Relevant Imaging Results:  Relevant Lab Results:   Additional Information SS# [id number]  Boneta Lucks, RN

## 2021-08-28 NOTE — TOC Initial Note (Addendum)
Transition of Care [facility]) - Initial/Assessment Note    Patient Details  Name: Carrie Best MRN: [id number] Date of Birth: 1928-08-07  [facility]) CM/SW Contact:    Leitha Bleak, RN Phone Number: 08/28/2021, 2:25 PM  Clinical Narrative:     Patient admitted with  COVID. Patient lives at home with son and his wife. TOC spoke with [name].   She states patient is a two person assist to transfer, and does not walk in the home. She is getting weaker and harder for them to handle. They want her to go to SNF for rehab before coming home.  TOC explained that due to COVID we can only offer [facility] for rehab. Patient is to weak for PT to assess today. TOC to follow up after PT eval.      Addendum: PT is recommending SNF- FL2 completed and [facility] to [facility].      Expected Discharge Plan: Skilled Nursing Facility Barriers to Discharge: Continued Medical Work up  Patient Goals and CMS Choice Patient states their goals for this hospitalization and ongoing recovery are:: to get better CMS Medicare.gov Compare Post Acute Care list provided to:: Patient Represenative (must comment) Choice offered to / list presented to : Adult Children  Expected Discharge Plan and Services Expected Discharge Plan: Skilled Nursing Facility      Prior Living Arrangements/Services    Patient language and need for interpreter reviewed:: Yes        Need for Family Participation in Patient Care: Yes (Comment) Care giver support system in place?: Yes (comment) Current home services: DME Criminal Activity/Legal Involvement Pertinent to Current Situation/Hospitalization: No - Comment as needed  Activities of Daily Living Home Assistive Devices/Equipment: Wheelchair, Environmental consultant (specify type), Transfer board, Transfer belt, Shower chair with back, Raised toilet seat with rails, Oxygen, Hearing aid, Grab bars in shower, Grab bars around toilet ADL Screening (condition at time of admission) Patient's cognitive  ability adequate to safely complete daily activities?: No Is the patient deaf or have difficulty hearing?: Yes Does the patient have difficulty seeing, even when wearing glasses/contacts?: Yes Does the patient have difficulty concentrating, remembering, or making decisions?: Yes Patient able to express need for assistance with ADLs?: Yes Does the patient have difficulty dressing or bathing?: Yes Independently performs ADLs?: No Communication: Needs assistance Is this a change from baseline?: Pre-admission baseline Dressing (OT): Dependent Is this a change from baseline?: Pre-admission baseline Grooming: Dependent Is this a change from baseline?: Pre-admission baseline Feeding: Appropriate for developmental age Bathing: Needs assistance Is this a change from baseline?: Pre-admission baseline Toileting: Dependent Is this a change from baseline?: Pre-admission baseline In/Out Bed: Dependent Is this a change from baseline?: Pre-admission baseline Walks in Home: Dependent Is this a change from baseline?: Pre-admission baseline Does the patient have difficulty walking or climbing stairs?: Yes Weakness of Legs: Both Weakness of Arms/Hands: Both  Permission Sought/Granted      Emotional Assessment      Alcohol / Substance Use: Not Applicable Psych Involvement: No (comment)  Admission diagnosis:  Weakness [R53.1] Acute respiratory failure with hypoxia (HCC) [J96.01] COVID-19 virus infection [U07.1] COVID-19 [U07.1] Patient Active Problem List   Diagnosis Date Noted   COVID-19 virus infection 08/27/2021   Chronic renal disease, stage III (HCC) 08/27/2021   Acute on chronic heart failure with preserved ejection fraction (HFpEF) (HCC) 07/25/2020   Acute exacerbation of CHF (congestive heart failure) (HCC) 07/24/2020   Constipation 07/24/2020   Leukocytosis 07/24/2020   Elevated brain natriuretic peptide (BNP)  level 07/24/2020   CAP (community acquired pneumonia) 07/24/2020   OSA  (obstructive sleep apnea) 07/10/2020   Nocturnal hypoxia 07/10/2020   Acute confusion 07/10/2020   CVA (cerebral vascular accident) (HCC) 06/19/2019   Acute encephalopathy 06/18/2019   Acute respiratory failure with hypoxia (HCC) 09/10/2018   Chronic kidney disease (CKD), stage II (mild) 09/10/2018   Amyloidosis of bladder (HCC) 09/10/2018   CHF exacerbation (HCC) 09/09/2018   ARF (acute renal failure) (HCC) 07/10/2018   Nausea & vomiting 07/09/2018   Hypokalemia 12/25/2017   Acute on chronic diastolic CHF (congestive heart failure) (HCC) 12/24/2017   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/19/2016   Rupture of right patellar tendon 05/19/2016   H/O nonunion of fracture 03/16/2016   Arthritis of knee 02/17/2015   Primary osteoarthritis of right knee Valgus 02/14/2015   Chronic diastolic CHF (congestive heart failure) (HCC) 02/06/2015   Proximal humerus fracture 06/10/2014   Obesity 03/18/2012   HTN (hypertension) 03/18/2012   GERD (gastroesophageal reflux disease) 03/18/2012   Hyperlipidemia with target LDL less than 70 03/18/2012   Sleep disturbance 03/18/2012   Atrial fibrillation (HCC) 03/18/2012   PCP:  Kirby Funk, MD Pharmacy:   [facility] - [place], [place] - [address] [place] [address] Phone: [phone] Fax: [phone]     Social Determinants of Health (SDOH) Interventions    Readmission Risk Interventions No flowsheet data found.

## 2021-08-28 NOTE — Progress Notes (Signed)
PROGRESS NOTE    Carrie Best  VFI:[id number] DOB: 05-03-1928 DOA: 08/27/2021 PCP: Kirby Funk, MD    Chief Complaint  Patient presents with   Weakness    Brief Narrative:  Carrie Best is a 87 y.o. female with medical history significant of anxiety/depression, paroxysmal atrial fibrillation, hypertension, hyperlipidemia, gastroesophageal reflux disease, diastolic heart failure, chronic respiratory failure with nocturnal hypoxia using 3-4 L nasal cannula supplementation at baseline; who presented to the hospital secondary to general malaise, confusion and shortness of breath.  Patient's symptoms have been present for the last 2-3 days and worsening.  Family initially felt patient has UTI based on her presentation and prior similar symptoms with that condition.  They had noted some low-grade temperature and she is requiring up to 5 L in order to keep her saturation above 89%.  There is no chest pain, no nausea, no vomiting, no overt bleeding, new focal deficits or any other complaints.   Patient is vaccinated against COVID, but no boosted   ED Course: Chest x-ray demonstrating son lateral right hemithorax that could reflect loculated pleural fluid versus pleural mass; Potassium 3.0, ferritin 143, LDH 155, procalcitonin < 0.10, D-dimer 1.63 positive respiratory panel for COVID PCR; CRP pending.  Given hypoxia and advanced age patient has been started on steroids and remdesivir.  [facility] hospitalist has been called to admit to the hospital for further evaluation and management of COVID-19 pneumonia.    Assessment & Plan: 1-acute on chronic respiratory failure with hypoxia secondary to COVID-19 virus infection/pneumonia. -Continue to follow inflammatory markers -Continue the use of vitamin C and zinc -Continue supportive care, mucolytic/antitussive and as needed bronchodilators. -Continue to wean oxygen supplementation as tolerated back to her baseline. -Continue treatment with  steroids and remdesivir. -Overall responding appropriately.  2-generalized weakness/deconditioning -Further exacerbated from baseline in the setting of acute COVID-19 infection/pneumonia. -Continue treatment as mentioned above -Physical therapy has evaluated patient with recommendation for a skilled nursing facility at discharge. -TOC has been made aware.  3-hypokalemia -Repleted and currently stabilized -Continue to follow electrolytes trend. -Will watch telemetry overnight.  4-paroxysmal atrial fibrillation -Continue Eliquis, beta-blocker and Cardizem. -Overall rate controlled and stable.  5-chronic diastolic heart failure -Stable and compensated -Continue to follow daily weights/low-sodium diet and strict I's and O's. -Planning to resume adjusted dose of diuretics on 08/29/2021.  6-chronic kidney disease a stage IIIb -Creatinine remained stable and at her baseline -Continue to follow trend and instability. -Minimize nephrotoxic agents and the use of contrast as much as possible.  7-essential hypertension -Overall stable and well-controlled -Continue heart healthy/low-sodium diet and current antihypertensive agents.  8-hyperlipidemia -Continue statins. -Heart healthy diet has been ordered.  9-gastroesophageal reflux disease -Continue PPI.  DVT prophylaxis: Chronically on Eliquis. Code Status: DNR/DNI. Family Communication: Patient's son over the phone. Disposition:   Status is: Inpatient   Consultants:  None  Procedures:  See below for x-ray reports.  Antimicrobials/antiviral:  Remdesivir (day 2 out of 5)   Subjective: Short winded with activity; intermittent coughing spells appreciated.  Improvement in her saturation appreciated demonstrating O2 sat above 92% on 4 L nasal cannula currently.  Patient is afebrile and reports no nausea or vomiting.  Objective: Vitals:   08/27/21 2226 08/28/21 0124 08/28/21 0504 08/28/21 0815  BP: (!) 147/63 (!) 157/73 (!)  163/86   Pulse: 98 78 80   Resp: 16 16 20    Temp: 97.9 F (36.6 C) 98 F (36.7 C) 97.9 F (36.6 C)   TempSrc: Oral Oral Oral  SpO2: 93% 95% 100% 99%  Weight:   70.2 kg   Height:        Intake/Output Summary (Last 24 hours) at 08/28/2021 1623 Last data filed at 08/28/2021 1500 Gross per 24 hour  Intake 1213.81 ml  Output 250 ml  Net 963.81 ml   Filed Weights   08/27/21 1007 08/28/21 0504  Weight: 75 kg 70.2 kg    Examination: General exam: Currently afebrile, no chest pain, no nausea, no vomiting.  Very hard of hearing.  Short winded with minimal exertion.  Intermittent coughing spells appreciated. Respiratory system: Positive rhonchi bilaterally; no wheezing on exam. Cardiovascular system: Rate controlled, no rubs, no gallops, no JVD. Gastrointestinal system: Abdomen is nondistended, soft and nontender. No organomegaly or masses felt. Normal bowel sounds heard. Central nervous system: Able to follow commands appropriately; very hard of hearing.  Reports feeling generally weak and with unstable balance. Extremities: No cyanosis or clubbing. Skin: No petechiae. Psychiatry: Stable mood.  Data Reviewed: I have personally reviewed following labs and imaging studies  CBC: Recent Labs  Lab 08/27/21 1020 08/28/21 0543  WBC 7.9 5.3  NEUTROABS 6.4 4.4  HGB 12.8 11.1*  HCT 40.4 34.0*  MCV 98.8 96.6  PLT 204 [id number]    Basic Metabolic Panel: Recent Labs  Lab 08/27/21 1020 08/28/21 0543  NA 139 138  K 3.0* 4.2  CL 97* 98  CO2 30 32  GLUCOSE 128* 153*  BUN 16 19  CREATININE 1.04* 0.82  CALCIUM 9.8 9.8  MG  --  2.1  PHOS  --  2.7    GFR: Estimated Creatinine Clearance: 45.1 mL/min (by C-G formula based on SCr of 0.82 mg/dL).  Liver Function Tests: Recent Labs  Lab 08/27/21 1020 08/28/21 0543  AST 19 17  ALT 10 11  ALKPHOS 90 78  BILITOT 1.0 1.0  PROT 6.8 6.0*  ALBUMIN 3.3* 3.0*    CBG: No results for input(s): GLUCAP in the last 168 hours.   Recent  Results (from the past 240 hour(s))  Resp Panel by RT-PCR (Flu A&B, Covid) Nasopharyngeal Swab     Status: Abnormal   Collection Time: 08/27/21 10:32 AM   Specimen: Nasopharyngeal Swab; Nasopharyngeal(NP) swabs in vial transport medium  Result Value Ref Range Status   SARS Coronavirus 2 by RT PCR POSITIVE (A) NEGATIVE Final    Comment: (NOTE) SARS-CoV-2 target nucleic acids are DETECTED.  The SARS-CoV-2 RNA is generally detectable in upper respiratory specimens during the acute phase of infection. Positive results are indicative of the presence of the identified virus, but do not rule out bacterial infection or co-infection with other pathogens not detected by the test. Clinical correlation with patient history and other diagnostic information is necessary to determine patient infection status. The expected result is Negative.  Fact Sheet for Patients: EntrepreneurPulse.com.au  Fact Sheet for Healthcare Providers: IncredibleEmployment.be  This test is not yet approved or cleared by the [place] FDA and  has been authorized for detection and/or diagnosis of SARS-CoV-2 by FDA under an Emergency Use Authorization (EUA).  This EUA will remain in effect (meaning this test can be used) for the duration of  the COVID-19 declaration under Section 564(b)(1) of the A ct, 21 U.S.C. section 360bbb-3(b)(1), unless the authorization is terminated or revoked sooner.     Influenza A by PCR NEGATIVE NEGATIVE Final   Influenza B by PCR NEGATIVE NEGATIVE Final    Comment: (NOTE) The Xpert Xpress SARS-CoV-2/FLU/RSV plus assay is intended as an aid in  the diagnosis of influenza from Nasopharyngeal swab specimens and should not be used as a sole basis for treatment. Nasal washings and aspirates are unacceptable for Xpert Xpress SARS-CoV-2/FLU/RSV testing.  Fact Sheet for Patients: EntrepreneurPulse.com.au  Fact Sheet for Healthcare  Providers: IncredibleEmployment.be  This test is not yet approved or cleared by the [place] FDA and has been authorized for detection and/or diagnosis of SARS-CoV-2 by FDA under an Emergency Use Authorization (EUA). This EUA will remain in effect (meaning this test can be used) for the duration of the COVID-19 declaration under Section 564(b)(1) of the Act, 21 U.S.C. section 360bbb-3(b)(1), unless the authorization is terminated or revoked.  Performed at [facility], [address], [place], [place] [address]   Blood Culture (routine x 2)     Status: None (Preliminary result)   Collection Time: 08/27/21  1:08 PM   Specimen: BLOOD  Result Value Ref Range Status   Specimen Description BLOOD BLOOD RIGHT ARM  Final   Special Requests   Final    BOTTLES DRAWN AEROBIC AND ANAEROBIC Blood Culture adequate volume   Culture   Final    NO GROWTH < 24 HOURS Performed at [facility], [address] [place], [place] [address]    Report Status PENDING  Incomplete  Blood Culture (routine x 2)     Status: None (Preliminary result)   Collection Time: 08/27/21  1:08 PM   Specimen: BLOOD  Result Value Ref Range Status   Specimen Description BLOOD BLOOD LEFT ARM  Final   Special Requests   Final    BOTTLES DRAWN AEROBIC AND ANAEROBIC Blood Culture adequate volume   Culture   Final    NO GROWTH < 24 HOURS Performed at [facility], [address], [place], [place] [address]    Report Status PENDING  Incomplete     Radiology Studies: DG Chest Port 1 View  Result Date: 08/27/2021 CLINICAL DATA:  Weakness, cough, shortness of breath EXAM: PORTABLE CHEST 1 VIEW COMPARISON:  Chest radiograph 06/16/2021 FINDINGS: The cardiomediastinal silhouette is within normal limits. There is density projecting over the lateral right hemithorax which may reflect an extrapleural mass or loculated pleural fluid. There is hazy density in the adjacent right lower lobe. The left lung is clear.  There is no left effusion. There is no pneumothorax. Postsurgical changes are seen in the right humerus. IMPRESSION: Density projecting over the lateral right hemithorax may reflect loculated pleural fluid or a pleural-based mass. There is hazy opacity in the adjacent right lower lobe. Recommend CT chest (preferably with contrast) for further evaluation. Electronically Signed   By: Valetta Mole M.D.   On: 08/27/2021 12:12     Scheduled Meds:  apixaban  5 mg Oral BID   vitamin C  500 mg Oral Daily   atorvastatin  40 mg Oral Daily   diltiazem  180 mg Oral Daily   DULoxetine  60 mg Oral Daily   hydrALAZINE  10 mg Oral BID   Ipratropium-Albuterol  1 puff Inhalation BID   isosorbide mononitrate  30 mg Oral Daily   methylPREDNISolone (SOLU-MEDROL) injection  1 mg/kg Intravenous Q12H   Followed by   Derrill Memo ON 08/31/2021] predniSONE  50 mg Oral Daily   metoprolol tartrate  50 mg Oral BID   potassium chloride  40 mEq Oral Daily   zinc sulfate  220 mg Oral Daily   Continuous Infusions:  remdesivir 100 mg in NS 100 mL 100 mg (08/28/21 0859)     LOS:  1 day    Barton Dubois, MD [facility]   To contact the attending provider between 7A-7P or the covering provider during after hours 7P-7A, please log into the web site [email or web address] and access using universal [place] password for that web site. If you do not have the password, please call the hospital operator.  08/28/2021, 4:23 PM

## 2021-08-29 LAB — CBC WITH DIFFERENTIAL/PLATELET
Abs Immature Granulocytes: 0.07 10*3/uL (ref 0.00–0.07)
Basophils Absolute: 0 10*3/uL (ref 0.0–0.1)
Basophils Relative: 0 %
Eosinophils Absolute: 0 10*3/uL (ref 0.0–0.5)
Eosinophils Relative: 0 %
HCT: 36.4 % (ref 36.0–46.0)
Hemoglobin: 11.8 g/dL — ABNORMAL LOW (ref 12.0–15.0)
Immature Granulocytes: 1 %
Lymphocytes Relative: 7 %
Lymphs Abs: 0.7 10*3/uL (ref 0.7–4.0)
MCH: 30.8 pg (ref 26.0–34.0)
MCHC: 32.4 g/dL (ref 30.0–36.0)
MCV: 95 fL (ref 80.0–100.0)
Monocytes Absolute: 0.3 10*3/uL (ref 0.1–1.0)
Monocytes Relative: 3 %
Neutro Abs: 9.6 10*3/uL — ABNORMAL HIGH (ref 1.7–7.7)
Neutrophils Relative %: 89 %
Platelets: 231 10*3/uL (ref 150–400)
RBC: 3.83 MIL/uL — ABNORMAL LOW (ref 3.87–5.11)
RDW: 14.5 % (ref 11.5–15.5)
WBC: 10.7 10*3/uL — ABNORMAL HIGH (ref 4.0–10.5)
nRBC: 0 % (ref 0.0–0.2)

## 2021-08-29 LAB — COMPREHENSIVE METABOLIC PANEL WITH GFR
ALT: 11 U/L (ref 0–44)
AST: 19 U/L (ref 15–41)
Albumin: 3.3 g/dL — ABNORMAL LOW (ref 3.5–5.0)
Alkaline Phosphatase: 82 U/L (ref 38–126)
Anion gap: 7 (ref 5–15)
BUN: 21 mg/dL (ref 8–23)
CO2: 31 mmol/L (ref 22–32)
Calcium: 10.4 mg/dL — ABNORMAL HIGH (ref 8.9–10.3)
Chloride: 99 mmol/L (ref 98–111)
Creatinine, Ser: 0.89 mg/dL (ref 0.44–1.00)
GFR, Estimated: >60 mL/min
Glucose, Bld: 169 mg/dL — ABNORMAL HIGH (ref 70–99)
Potassium: 4.8 mmol/L (ref 3.5–5.1)
Sodium: 137 mmol/L (ref 135–145)
Total Bilirubin: 0.9 mg/dL (ref 0.3–1.2)
Total Protein: 6.5 g/dL (ref 6.5–8.1)

## 2021-08-29 LAB — URINE CULTURE

## 2021-08-29 LAB — FERRITIN: Ferritin: 193 ng/mL (ref 11–307)

## 2021-08-29 LAB — C-REACTIVE PROTEIN: CRP: 0.8 mg/dL (ref <1.0)

## 2021-08-29 LAB — MAGNESIUM: Magnesium: 2.3 mg/dL (ref 1.7–2.4)

## 2021-08-29 LAB — PHOSPHORUS: Phosphorus: 3 mg/dL (ref 2.5–4.6)

## 2021-08-29 LAB — D-DIMER, QUANTITATIVE: D-Dimer, Quant: 1.89 ug/mL-FEU — ABNORMAL HIGH (ref 0.00–0.50)

## 2021-08-29 MED ORDER — TORSEMIDE 20 MG PO TABS
20.0000 mg | ORAL_TABLET | Freq: Every day | ORAL | Status: DC
  Administered 2021-08-29 – 2021-09-01 (×4): 20 mg via ORAL
  Filled 2021-08-29 (×4): qty 1

## 2021-08-29 NOTE — TOC Progression Note (Signed)
Transition of Care [facility]) - Progression Note    Patient Details  Name: Carrie Best MRN: [id number] Date of Birth: [birth year]  [facility]) CM/SW Contact  Barry Brunner, LCSW Phone Number: 08/29/2021, 12:52 PM  Clinical Narrative:    CSW notified Debbie with [facility] that patient is Covid + so that they are able to plan for isolation bed. TOC to follow.   Expected Discharge Plan: Skilled Nursing Facility Barriers to Discharge: Continued Medical Work up  Expected Discharge Plan and Services Expected Discharge Plan: Skilled Nursing Facility                                               Social Determinants of Health (SDOH) Interventions    Readmission Risk Interventions No flowsheet data found.

## 2021-08-29 NOTE — Progress Notes (Signed)
PROGRESS NOTE    Carrie Best  [id number] DOB: [birth year] DOA: 08/27/2021 PCP: [name], MD    Chief Complaint  Patient presents with   Weakness    Brief Narrative:  Carrie Best is a 87 y.o. female with medical history significant of anxiety/depression, paroxysmal atrial fibrillation, hypertension, hyperlipidemia, gastroesophageal reflux disease, diastolic heart failure, chronic respiratory failure with nocturnal hypoxia using 3-4 L nasal cannula supplementation at baseline; who presented to the hospital secondary to general malaise, confusion and shortness of breath.  Patient's symptoms have been present for the last 2-3 days and worsening.  Family initially felt patient has UTI based on her presentation and prior similar symptoms with that condition.  They had noted some low-grade temperature and she is requiring up to 5 L in order to keep her saturation above 89%.  There is no chest pain, no nausea, no vomiting, no overt bleeding, new focal deficits or any other complaints.   Patient is vaccinated against COVID, but no boosted   ED Course: Chest x-ray demonstrating son lateral right hemithorax that could reflect loculated pleural fluid versus pleural mass; Potassium 3.0, ferritin 143, LDH 155, procalcitonin < 0.10, D-dimer 1.63 positive respiratory panel for COVID PCR; CRP pending.  Given hypoxia and advanced age patient has been started on steroids and remdesivir.  [facility] has been called to admit to the hospital for further evaluation and management of COVID-19 pneumonia.    Assessment & Plan: 1-acute on chronic respiratory failure with hypoxia secondary to COVID-19 virus infection/pneumonia. -Continue to follow inflammatory markers -Continue the use of vitamin C and zinc -Continue supportive care, mucolytic/antitussive and as needed bronchodilators. -Continue to wean oxygen supplementation as tolerated back to her baseline. -Continue treatment with  steroids and remdesivir. (Day 3/5) -Overall responding appropriately.  2-generalized weakness/deconditioning -Further exacerbated from baseline in the setting of acute COVID-19 infection/pneumonia. -Continue treatment as mentioned above -Physical therapy has evaluated patient with recommendation for a skilled nursing facility at discharge. -TOC has been made aware.  3-hypokalemia -Repleted and currently stabilized -Continue to follow electrolytes trend. -Will watch telemetry overnight.  4-paroxysmal atrial fibrillation -Continue Eliquis, beta-blocker and Cardizem. -Overall rate controlled and stable.  5-chronic diastolic heart failure -Stable and compensated -Continue to follow daily weights/low-sodium diet and strict I's and O's. -start low dose diuretics (demadex 20mg  daily)  6-chronic kidney disease a stage IIIb -Creatinine remained stable and at her baseline -Continue to follow trend and instability. -Minimize nephrotoxic agents and the use of contrast as much as possible.  7-essential hypertension -Overall stable and well-controlled -Continue heart healthy/low-sodium diet and current antihypertensive agents.  8-hyperlipidemia -Continue statins. -Heart healthy diet has been ordered.  9-gastroesophageal reflux disease -Continue PPI.  DVT prophylaxis: Chronically on Eliquis. Code Status: DNR/DNI. Family Communication: Patient's son over the phone. Disposition:   Status is: Inpatient   Consultants:  None  Procedures:  See below for x-ray reports.  Antimicrobials/antiviral:  Remdesivir (day 3 out of 5)   Subjective: Good oxygen saturation on 4 L nasal cannula supplementation; no fever, no nausea, no vomiting.  Reported to be generally weak and is still short winded with activity and having intermittent coughing spells.  Objective: Vitals:   08/29/21 0435 08/29/21 0500 08/29/21 0737 08/29/21 1444  BP: (!) 150/76   (!) 150/64  Pulse: 66   86  Resp: 18    20  Temp: 97.8 F (36.6 C)   98 F (36.7 C)  TempSrc:    Oral  SpO2: 98%  98%  96%  Weight:  71.8 kg    Height:        Intake/Output Summary (Last 24 hours) at 08/29/2021 1540 Last data filed at 08/29/2021 0500 Gross per 24 hour  Intake 240 ml  Output 900 ml  Net -660 ml   Filed Weights   08/27/21 1007 08/28/21 0504 08/29/21 0500  Weight: 75 kg 70.2 kg 71.8 kg    Examination: General exam: Alert, awake, following commands.  Patient forgetful and very hard of hearing.  Reporting breathing is better. Respiratory system: Positive scattered rhonchi, no using accessory muscles.  Feeling slightly short winded with activity.  Positive intermittent coughing spells. Cardiovascular system:RRR. No rubs, gallops or JVD. Gastrointestinal system: Abdomen is nondistended, soft and nontender. No organomegaly or masses felt. Normal bowel sounds heard. Central nervous system: No focal deficit. Extremities: No cyanosis or clubbing. Skin: No petechiae. Psychiatry: Stable mood.  Data Reviewed: I have personally reviewed following labs and imaging studies  CBC: Recent Labs  Lab 08/27/21 1020 08/28/21 0543 08/29/21 0415  WBC 7.9 5.3 10.7*  NEUTROABS 6.4 4.4 9.6*  HGB 12.8 11.1* 11.8*  HCT 40.4 34.0* 36.4  MCV 98.8 96.6 95.0  PLT 204 213 231    Basic Metabolic Panel: Recent Labs  Lab 08/27/21 1020 08/28/21 0543 08/29/21 0415  NA 139 138 137  K 3.0* 4.2 4.8  CL 97* 98 99  CO2 30 32 31  GLUCOSE 128* 153* 169*  BUN 16 19 21   CREATININE 1.04* 0.82 0.89  CALCIUM 9.8 9.8 10.4*  MG  --  2.1 2.3  PHOS  --  2.7 3.0    GFR: Estimated Creatinine Clearance: 41.5 mL/min (by C-G formula based on SCr of 0.89 mg/dL).  Liver Function Tests: Recent Labs  Lab 08/27/21 1020 08/28/21 0543 08/29/21 0415  AST 19 17 19   ALT 10 11 11   ALKPHOS 90 78 82  BILITOT 1.0 1.0 0.9  PROT 6.8 6.0* 6.5  ALBUMIN 3.3* 3.0* 3.3*    CBG: No results for input(s): GLUCAP in the last 168  hours.   Recent Results (from the past 240 hour(s))  Resp Panel by RT-PCR (Flu A&B, Covid) Nasopharyngeal Swab     Status: Abnormal   Collection Time: 08/27/21 10:32 AM   Specimen: Nasopharyngeal Swab; Nasopharyngeal(NP) swabs in vial transport medium  Result Value Ref Range Status   SARS Coronavirus 2 by RT PCR POSITIVE (A) NEGATIVE Final    Comment: (NOTE) SARS-CoV-2 target nucleic acids are DETECTED.  The SARS-CoV-2 RNA is generally detectable in upper respiratory specimens during the acute phase of infection. Positive results are indicative of the presence of the identified virus, but do not rule out bacterial infection or co-infection with other pathogens not detected by the test. Clinical correlation with patient history and other diagnostic information is necessary to determine patient infection status. The expected result is Negative.  Fact Sheet for Patients:  Fact Sheet for Healthcare Providers:  This test is not yet approved or cleared by the 08/29/21 FDA and  has been authorized for detection and/or diagnosis of SARS-CoV-2 by FDA under an Emergency Use Authorization (EUA).  This EUA will remain in effect (meaning this test can be used) for the duration of  the COVID-19 declaration under Section 564(b)(1) of the A ct, 21 U.S.C. section 360bbb-3(b)(1), unless the authorization is terminated or revoked sooner.     Influenza A by PCR NEGATIVE NEGATIVE Final   Influenza B by PCR NEGATIVE NEGATIVE Final  Comment: (NOTE) The Xpert Xpress SARS-CoV-2/FLU/RSV plus assay is intended as an aid in the diagnosis of influenza from Nasopharyngeal swab specimens and should not be used as a sole basis for treatment. Nasal washings and aspirates are unacceptable for Xpert Xpress SARS-CoV-2/FLU/RSV testing.  Fact Sheet for Patients: EntrepreneurPulse.com.au  Fact Sheet  for Healthcare Providers: IncredibleEmployment.be  This test is not yet approved or cleared by the [place] FDA and has been authorized for detection and/or diagnosis of SARS-CoV-2 by FDA under an Emergency Use Authorization (EUA). This EUA will remain in effect (meaning this test can be used) for the duration of the COVID-19 declaration under Section 564(b)(1) of the Act, 21 U.S.C. section 360bbb-3(b)(1), unless the authorization is terminated or revoked.  Performed at [facility], [address] [place], [place] [address]   Blood Culture (routine x 2)     Status: None (Preliminary result)   Collection Time: 08/27/21  1:08 PM   Specimen: BLOOD  Result Value Ref Range Status   Specimen Description BLOOD BLOOD RIGHT ARM  Final   Special Requests   Final    BOTTLES DRAWN AEROBIC AND ANAEROBIC Blood Culture adequate volume   Culture   Final    NO GROWTH 2 DAYS Performed at [facility], [address], [place], [place] [address]    Report Status PENDING  Incomplete  Blood Culture (routine x 2)     Status: None (Preliminary result)   Collection Time: 08/27/21  1:08 PM   Specimen: BLOOD  Result Value Ref Range Status   Specimen Description BLOOD BLOOD LEFT ARM  Final   Special Requests   Final    BOTTLES DRAWN AEROBIC AND ANAEROBIC Blood Culture adequate volume   Culture   Final    NO GROWTH 2 DAYS Performed at [facility], [address], [place], [place] [address]    Report Status PENDING  Incomplete  Urine Culture     Status: Abnormal   Collection Time: 08/27/21  2:11 PM   Specimen: Urine, Clean Catch  Result Value Ref Range Status   Specimen Description   Final    URINE, CLEAN CATCH Performed at [facility], [address] [place], [place] [address]    Special Requests   Final    NONE Performed at [facility], [address] [place], [place] [address]    Culture MULTIPLE SPECIES PRESENT, SUGGEST RECOLLECTION (A)  Final   Report Status  08/29/2021 FINAL  Final     Radiology Studies: No results found.   Scheduled Meds:  apixaban  5 mg Oral BID   vitamin C  500 mg Oral Daily   atorvastatin  40 mg Oral Daily   diltiazem  180 mg Oral Daily   DULoxetine  60 mg Oral Daily   hydrALAZINE  10 mg Oral BID   Ipratropium-Albuterol  1 puff Inhalation BID   isosorbide mononitrate  30 mg Oral Daily   methylPREDNISolone (SOLU-MEDROL) injection  1 mg/kg Intravenous Q12H   Followed by   Derrill Memo ON 08/31/2021] predniSONE  50 mg Oral Daily   metoprolol tartrate  50 mg Oral BID   potassium chloride  40 mEq Oral Daily   zinc sulfate  220 mg Oral Daily   Continuous Infusions:  remdesivir 100 mg in NS 100 mL 100 mg (08/29/21 1030)     LOS: 2 days    Barton Dubois, MD [facility]   To contact the attending provider between 7A-7P or the covering provider during after hours 7P-7A,  please log into the web site [email or web address] and access using universal [place] password for that web site. If you do not have the password, please call the hospital operator.  08/29/2021, 3:40 PM

## 2021-08-30 LAB — CBC WITH DIFFERENTIAL/PLATELET
Abs Immature Granulocytes: 0.06 10*3/uL (ref 0.00–0.07)
Basophils Absolute: 0 10*3/uL (ref 0.0–0.1)
Basophils Relative: 0 %
Eosinophils Absolute: 0 10*3/uL (ref 0.0–0.5)
Eosinophils Relative: 0 %
HCT: 34.5 % — ABNORMAL LOW (ref 36.0–46.0)
Hemoglobin: 11.1 g/dL — ABNORMAL LOW (ref 12.0–15.0)
Immature Granulocytes: 1 %
Lymphocytes Relative: 5 %
Lymphs Abs: 0.6 10*3/uL — ABNORMAL LOW (ref 0.7–4.0)
MCH: 31.5 pg (ref 26.0–34.0)
MCHC: 32.2 g/dL (ref 30.0–36.0)
MCV: 98 fL (ref 80.0–100.0)
Monocytes Absolute: 0.4 10*3/uL (ref 0.1–1.0)
Monocytes Relative: 3 %
Neutro Abs: 11.6 10*3/uL — ABNORMAL HIGH (ref 1.7–7.7)
Neutrophils Relative %: 91 %
Platelets: 213 10*3/uL (ref 150–400)
RBC: 3.52 MIL/uL — ABNORMAL LOW (ref 3.87–5.11)
RDW: 14.5 % (ref 11.5–15.5)
WBC: 12.7 10*3/uL — ABNORMAL HIGH (ref 4.0–10.5)
nRBC: 0 % (ref 0.0–0.2)

## 2021-08-30 LAB — MAGNESIUM: Magnesium: 2.2 mg/dL (ref 1.7–2.4)

## 2021-08-30 LAB — COMPREHENSIVE METABOLIC PANEL
ALT: 12 U/L (ref 0–44)
AST: 20 U/L (ref 15–41)
Albumin: 3.2 g/dL — ABNORMAL LOW (ref 3.5–5.0)
Alkaline Phosphatase: 78 U/L (ref 38–126)
Anion gap: 11 (ref 5–15)
BUN: 31 mg/dL — ABNORMAL HIGH (ref 8–23)
CO2: 29 mmol/L (ref 22–32)
Calcium: 9.7 mg/dL (ref 8.9–10.3)
Chloride: 95 mmol/L — ABNORMAL LOW (ref 98–111)
Creatinine, Ser: 1.04 mg/dL — ABNORMAL HIGH (ref 0.44–1.00)
GFR, Estimated: 51 mL/min — ABNORMAL LOW (ref >60)
Glucose, Bld: 171 mg/dL — ABNORMAL HIGH (ref 70–99)
Potassium: 3.9 mmol/L (ref 3.5–5.1)
Sodium: 135 mmol/L (ref 135–145)
Total Bilirubin: 0.9 mg/dL (ref 0.3–1.2)
Total Protein: 6.1 g/dL — ABNORMAL LOW (ref 6.5–8.1)

## 2021-08-30 LAB — C-REACTIVE PROTEIN: CRP: 0.6 mg/dL (ref <1.0)

## 2021-08-30 LAB — D-DIMER, QUANTITATIVE: D-Dimer, Quant: 2.07 ug/mL-FEU — ABNORMAL HIGH (ref 0.00–0.50)

## 2021-08-30 LAB — FERRITIN: Ferritin: 152 ng/mL (ref 11–307)

## 2021-08-30 LAB — PHOSPHORUS: Phosphorus: 2.9 mg/dL (ref 2.5–4.6)

## 2021-08-30 NOTE — Progress Notes (Signed)
PROGRESS NOTE    Carrie Best  BMZ:[id number] DOB: 08-16-1928 DOA: 08/27/2021 PCP: Kirby Funk, MD    Chief Complaint  Patient presents with   Weakness    Brief Narrative:  Carrie Best is a 87 y.o. female with medical history significant of anxiety/depression, paroxysmal atrial fibrillation, hypertension, hyperlipidemia, gastroesophageal reflux disease, diastolic heart failure, chronic respiratory failure with nocturnal hypoxia using 3-4 L nasal cannula supplementation at baseline; who presented to the hospital secondary to general malaise, confusion and shortness of breath.  Patient's symptoms have been present for the last 2-3 days and worsening.  Family initially felt patient has UTI based on her presentation and prior similar symptoms with that condition.  They had noted some low-grade temperature and she is requiring up to 5 L in order to keep her saturation above 89%.  There is no chest pain, no nausea, no vomiting, no overt bleeding, new focal deficits or any other complaints.   Patient is vaccinated against COVID, but no boosted   ED Course: Chest x-ray demonstrating son lateral right hemithorax that could reflect loculated pleural fluid versus pleural mass; Potassium 3.0, ferritin 143, LDH 155, procalcitonin < 0.10, D-dimer 1.63 positive respiratory panel for COVID PCR; CRP pending.  Given hypoxia and advanced age patient has been started on steroids and remdesivir.  [facility] hospitalist has been called to admit to the hospital for further evaluation and management of COVID-19 pneumonia.    Assessment & Plan: 1-acute on chronic respiratory failure with hypoxia secondary to COVID-19 virus infection/pneumonia. -Continue to follow inflammatory markers -Continue the use of vitamin C and zinc -Continue supportive care, mucolytic/antitussive and as needed bronchodilators. -Continue to wean oxygen supplementation as tolerated back to her baseline. -Continue treatment with  steroids and remdesivir. (Day 4/5) -Overall responding appropriately.  2-generalized weakness/deconditioning -Further exacerbated from baseline in the setting of acute COVID-19 infection/pneumonia. -Continue treatment as mentioned above -Physical therapy has evaluated patient with recommendation for a skilled nursing facility at discharge. -TOC has been made aware.  3-hypokalemia -Repleted and currently stabilized -Continue to follow electrolytes trend. -Will watch telemetry overnight.  4-paroxysmal atrial fibrillation -Continue Eliquis, beta-blocker and Cardizem. -Overall rate controlled and stable.  5-chronic diastolic heart failure -Stable and compensated -Continue to follow daily weights/low-sodium diet and strict I's and O's. -start low dose diuretics (demadex 20mg  daily)  6-chronic kidney disease a stage IIIb -Creatinine remained stable and at her baseline -Continue to follow trend and instability. -Minimize nephrotoxic agents and the use of contrast as much as possible.  7-essential hypertension -Overall stable and well-controlled -Continue heart healthy/low-sodium diet and current antihypertensive agents.  8-hyperlipidemia -Continue statins. -Heart healthy diet has been ordered.  9-gastroesophageal reflux disease -Continue PPI.  DVT prophylaxis: Chronically on Eliquis. Code Status: DNR/DNI. Family Communication: Patient's son over the phone. Disposition:   Status is: Inpatient   Consultants:  None  Procedures:  See below for x-ray reports.  Antimicrobials/antiviral:  Remdesivir (day 4 out of 5)   Subjective: Afebrile, no chest pain, no nausea, no vomiting.  Good saturation on 4 L nasal cannula supplementation.  Continue reporting generalized weakness and intermittent coughing spells.  Objective: Vitals:   08/30/21 0500 08/30/21 0838 08/30/21 0845 08/30/21 0908  BP:  (!) 189/101 (!) 189/101   Pulse:  68    Resp:      Temp:      TempSrc:       SpO2:    95%  Weight: 71.9 kg     Height:  Intake/Output Summary (Last 24 hours) at 08/30/2021 1407 Last data filed at 08/30/2021 0851 Gross per 24 hour  Intake 960 ml  Output 1800 ml  Net -840 ml   Filed Weights   08/28/21 0504 08/29/21 0500 08/30/21 0500  Weight: 70.2 kg 71.8 kg 71.9 kg    Examination: General exam: In no major distress; good saturation on chronic supplementation.  Patient is afebrile and expressing generalized weakness, deconditioning and intermittent coughing spells. Respiratory system: No using accessory muscles; no crackles on exam.  Positive scattered rhonchi; no wheezing. Cardiovascular system:RRR. No murmurs, rubs, gallops.  No JVD Gastrointestinal system: Abdomen is nondistended, soft and nontender. No organomegaly or masses felt. Normal bowel sounds heard. Central nervous system: No focal deficits Extremities: No cyanosis or clubbing. Skin: No petechiae. Psychiatry: Judgment and insight appears stable overall; normal mood.  Data Reviewed: I have personally reviewed following labs and imaging studies  CBC: Recent Labs  Lab 08/27/21 1020 08/28/21 0543 08/29/21 0415 08/30/21 0443  WBC 7.9 5.3 10.7* 12.7*  NEUTROABS 6.4 4.4 9.6* 11.6*  HGB 12.8 11.1* 11.8* 11.1*  HCT 40.4 34.0* 36.4 34.5*  MCV 98.8 96.6 95.0 98.0  PLT 204 213 231 213    Basic Metabolic Panel: Recent Labs  Lab 08/27/21 1020 08/28/21 0543 08/29/21 0415 08/30/21 0443  NA 139 138 137 135  K 3.0* 4.2 4.8 3.9  CL 97* 98 99 95*  CO2 30 32 31 29  GLUCOSE 128* 153* 169* 171*  BUN 16 19 21  31*  CREATININE 1.04* 0.82 0.89 1.04*  CALCIUM 9.8 9.8 10.4* 9.7  MG  --  2.1 2.3 2.2  PHOS  --  2.7 3.0 2.9    GFR: Estimated Creatinine Clearance: 35.5 mL/min (A) (by C-G formula based on SCr of 1.04 mg/dL (H)).  Liver Function Tests: Recent Labs  Lab 08/27/21 1020 08/28/21 0543 08/29/21 0415 08/30/21 0443  AST 19 17 19 20   ALT 10 11 11 12   ALKPHOS 90 78 82 78   BILITOT 1.0 1.0 0.9 0.9  PROT 6.8 6.0* 6.5 6.1*  ALBUMIN 3.3* 3.0* 3.3* 3.2*    CBG: No results for input(s): GLUCAP in the last 168 hours.   Recent Results (from the past 240 hour(s))  Resp Panel by RT-PCR (Flu A&B, Covid) Nasopharyngeal Swab     Status: Abnormal   Collection Time: 08/27/21 10:32 AM   Specimen: Nasopharyngeal Swab; Nasopharyngeal(NP) swabs in vial transport medium  Result Value Ref Range Status   SARS Coronavirus 2 by RT PCR POSITIVE (A) NEGATIVE Final    Comment: (NOTE) SARS-CoV-2 target nucleic acids are DETECTED.  The SARS-CoV-2 RNA is generally detectable in upper respiratory specimens during the acute phase of infection. Positive results are indicative of the presence of the identified virus, but do not rule out bacterial infection or co-infection with other pathogens not detected by the test. Clinical correlation with patient history and other diagnostic information is necessary to determine patient infection status. The expected result is Negative.  Fact Sheet for Patients:  Fact Sheet for Healthcare Providers:  This test is not yet approved or cleared by the 08/29/21 FDA and  has been authorized for detection and/or diagnosis of SARS-CoV-2 by FDA under an Emergency Use Authorization (EUA).  This EUA will remain in effect (meaning this test can be used) for the duration of  the COVID-19 declaration under Section 564(b)(1) of the A ct, 21 U.S.C. section 360bbb-3(b)(1), unless the authorization is terminated or revoked sooner.  Influenza A by PCR NEGATIVE NEGATIVE Final   Influenza B by PCR NEGATIVE NEGATIVE Final    Comment: (NOTE) The Xpert Xpress SARS-CoV-2/FLU/RSV plus assay is intended as an aid in the diagnosis of influenza from Nasopharyngeal swab specimens and should not be used as a sole basis for treatment. Nasal washings and aspirates are  unacceptable for Xpert Xpress SARS-CoV-2/FLU/RSV testing.  Fact Sheet for Patients: EntrepreneurPulse.com.au  Fact Sheet for Healthcare Providers: IncredibleEmployment.be  This test is not yet approved or cleared by the [place] FDA and has been authorized for detection and/or diagnosis of SARS-CoV-2 by FDA under an Emergency Use Authorization (EUA). This EUA will remain in effect (meaning this test can be used) for the duration of the COVID-19 declaration under Section 564(b)(1) of the Act, 21 U.S.C. section 360bbb-3(b)(1), unless the authorization is terminated or revoked.  Performed at [facility], [address] [place], [place] [address]   Blood Culture (routine x 2)     Status: None (Preliminary result)   Collection Time: 08/27/21  1:08 PM   Specimen: BLOOD  Result Value Ref Range Status   Specimen Description BLOOD BLOOD RIGHT ARM  Final   Special Requests   Final    BOTTLES DRAWN AEROBIC AND ANAEROBIC Blood Culture adequate volume   Culture   Final    NO GROWTH 2 DAYS Performed at [facility], [address] [place], [place] [address]    Report Status PENDING  Incomplete  Blood Culture (routine x 2)     Status: None (Preliminary result)   Collection Time: 08/27/21  1:08 PM   Specimen: BLOOD  Result Value Ref Range Status   Specimen Description BLOOD BLOOD LEFT ARM  Final   Special Requests   Final    BOTTLES DRAWN AEROBIC AND ANAEROBIC Blood Culture adequate volume   Culture   Final    NO GROWTH 2 DAYS Performed at [facility], [address] [place], [place] [address]    Report Status PENDING  Incomplete  Urine Culture     Status: Abnormal   Collection Time: 08/27/21  2:11 PM   Specimen: Urine, Clean Catch  Result Value Ref Range Status   Specimen Description   Final    URINE, CLEAN CATCH Performed at [facility], [address], [place], [place] [address]    Special Requests   Final    NONE Performed at [facility], [address] [place],  [address]    Culture MULTIPLE SPECIES PRESENT, SUGGEST RECOLLECTION (A)  Final   Report Status 08/29/2021 FINAL  Final     Radiology Studies: No results found.   Scheduled Meds:  apixaban  5 mg Oral BID   vitamin C  500 mg Oral Daily   atorvastatin  40 mg Oral Daily   diltiazem  180 mg Oral Daily   DULoxetine  60 mg Oral Daily   hydrALAZINE  10 mg Oral BID   Ipratropium-Albuterol  1 puff Inhalation BID   isosorbide mononitrate  30 mg Oral Daily   methylPREDNISolone (SOLU-MEDROL) injection  1 mg/kg Intravenous Q12H   Followed by   Derrill Memo ON 08/31/2021] predniSONE  50 mg Oral Daily   metoprolol tartrate  50 mg Oral BID   potassium chloride  40 mEq Oral Daily   torsemide  20 mg Oral Daily   zinc sulfate  220 mg Oral Daily   Continuous Infusions:  remdesivir 100 mg in NS 100 mL 100 mg (08/30/21 0835)     LOS:  3 days    Barton Dubois, MD [facility]   To contact the attending provider between 7A-7P or the covering provider during after hours 7P-7A, please log into the web site [email or web address] and access using universal [place] password for that web site. If you do not have the password, please call the hospital operator.  08/30/2021, 2:07 PM

## 2021-08-30 NOTE — Progress Notes (Signed)
Patient reports feeling closed up and like she was in a closet. Ativan given. MD informed. No new orders placed at this time. Pulled curtain so patient's door is visible she states this helps some. HX of anxiety attacks per patient. Will continue to monitor patient.

## 2021-08-30 NOTE — TOC Progression Note (Signed)
Transition of Care [facility]) - Progression Note    Patient Details  Name: Carrie Best MRN: [id number] Date of Birth: Sep 05, 1928  [facility] of [facility]) CM/SW Contact  Elliot Gault, LCSW Phone Number: 08/30/2021, 1:49 PM  Clinical Narrative:     TOC following. MD states pt will likely be stable for dc to SNF tomorrow. Pt will need insurance authorization for SNF before she can transfer. TOC will follow up in AM.  Expected Discharge Plan: Skilled Nursing Facility Barriers to Discharge: Continued Medical Work up  Expected Discharge Plan and Services Expected Discharge Plan: Skilled Nursing Facility                                               Social Determinants of Health (SDOH) Interventions    Readmission Risk Interventions No flowsheet data found.

## 2021-08-30 NOTE — Plan of Care (Signed)

## 2021-08-31 LAB — CBC WITH DIFFERENTIAL/PLATELET
Abs Immature Granulocytes: 0.08 10*3/uL — ABNORMAL HIGH (ref 0.00–0.07)
Basophils Absolute: 0 10*3/uL (ref 0.0–0.1)
Basophils Relative: 0 %
Eosinophils Absolute: 0 10*3/uL (ref 0.0–0.5)
Eosinophils Relative: 0 %
HCT: 33.8 % — ABNORMAL LOW (ref 36.0–46.0)
Hemoglobin: 11.1 g/dL — ABNORMAL LOW (ref 12.0–15.0)
Immature Granulocytes: 1 %
Lymphocytes Relative: 5 %
Lymphs Abs: 0.5 10*3/uL — ABNORMAL LOW (ref 0.7–4.0)
MCH: 31.8 pg (ref 26.0–34.0)
MCHC: 32.8 g/dL (ref 30.0–36.0)
MCV: 96.8 fL (ref 80.0–100.0)
Monocytes Absolute: 0.2 10*3/uL (ref 0.1–1.0)
Monocytes Relative: 3 %
Neutro Abs: 8.3 10*3/uL — ABNORMAL HIGH (ref 1.7–7.7)
Neutrophils Relative %: 91 %
Platelets: 172 10*3/uL (ref 150–400)
RBC: 3.49 MIL/uL — ABNORMAL LOW (ref 3.87–5.11)
RDW: 14.2 % (ref 11.5–15.5)
WBC: 9 10*3/uL (ref 4.0–10.5)
nRBC: 0 % (ref 0.0–0.2)

## 2021-08-31 LAB — COMPREHENSIVE METABOLIC PANEL
ALT: 15 U/L (ref 0–44)
AST: 19 U/L (ref 15–41)
Albumin: 2.9 g/dL — ABNORMAL LOW (ref 3.5–5.0)
Alkaline Phosphatase: 74 U/L (ref 38–126)
Anion gap: 11 (ref 5–15)
BUN: 36 mg/dL — ABNORMAL HIGH (ref 8–23)
CO2: 27 mmol/L (ref 22–32)
Calcium: 9.6 mg/dL (ref 8.9–10.3)
Chloride: 93 mmol/L — ABNORMAL LOW (ref 98–111)
Creatinine, Ser: 1.13 mg/dL — ABNORMAL HIGH (ref 0.44–1.00)
GFR, Estimated: 46 mL/min — ABNORMAL LOW (ref >60)
Glucose, Bld: 275 mg/dL — ABNORMAL HIGH (ref 70–99)
Potassium: 4 mmol/L (ref 3.5–5.1)
Sodium: 131 mmol/L — ABNORMAL LOW (ref 135–145)
Total Bilirubin: 0.4 mg/dL (ref 0.3–1.2)
Total Protein: 5.8 g/dL — ABNORMAL LOW (ref 6.5–8.1)

## 2021-08-31 LAB — C-REACTIVE PROTEIN: CRP: 0.5 mg/dL (ref <1.0)

## 2021-08-31 LAB — FERRITIN: Ferritin: 145 ng/mL (ref 11–307)

## 2021-08-31 LAB — MAGNESIUM: Magnesium: 2.2 mg/dL (ref 1.7–2.4)

## 2021-08-31 LAB — PHOSPHORUS: Phosphorus: 2.5 mg/dL (ref 2.5–4.6)

## 2021-08-31 LAB — D-DIMER, QUANTITATIVE: D-Dimer, Quant: 2.3 ug/mL-FEU — ABNORMAL HIGH (ref 0.00–0.50)

## 2021-08-31 NOTE — Progress Notes (Signed)
PROGRESS NOTE    Carrie Best  [id number] DOB: Oct 01, 1927 DOA: 08/27/2021 PCP: [name], MD    Chief Complaint  Patient presents with   Weakness    Brief Narrative:  Carrie Best is a 87 y.o. female with medical history significant of anxiety/depression, paroxysmal atrial fibrillation, hypertension, hyperlipidemia, gastroesophageal reflux disease, diastolic heart failure, chronic respiratory failure with nocturnal hypoxia using 3-4 L nasal cannula supplementation at baseline; who presented to the hospital secondary to general malaise, confusion and shortness of breath.  Patient's symptoms have been present for the last 2-3 days and worsening.  Family initially felt patient has UTI based on her presentation and prior similar symptoms with that condition.  They had noted some low-grade temperature and she is requiring up to 5 L in order to keep her saturation above 89%.  There is no chest pain, no nausea, no vomiting, no overt bleeding, new focal deficits or any other complaints.   Patient is vaccinated against COVID, but no boosted   ED Course: Chest x-ray demonstrating son lateral right hemithorax that could reflect loculated pleural fluid versus pleural mass; Potassium 3.0, ferritin 143, LDH 155, procalcitonin < 0.10, D-dimer 1.63 positive respiratory panel for COVID PCR; CRP pending.  Given hypoxia and advanced age patient has been started on steroids and remdesivir.  [facility] has been called to admit to the hospital for further evaluation and management of COVID-19 pneumonia.    Assessment & Plan: 1-acute on chronic respiratory failure with hypoxia secondary to COVID-19 virus infection/pneumonia. -Continue to follow inflammatory markers -Continue the use of vitamin C and zinc -Continue supportive care, mucolytic/antitussive and as needed bronchodilators. -Continue home O2 supplementation. -Continue treatment with steroids and remdesivir. (Day 5/5) -Overall  responding appropriately.  2-generalized weakness/deconditioning -Further exacerbated from baseline in the setting of acute COVID-19 infection/pneumonia. -Continue treatment as mentioned above -Physical therapy has evaluated patient with recommendation for a skilled nursing facility at discharge. -TOC has been made aware. -hopefully able to go to [facility] SNF in am.  3-hypokalemia -Repleted and currently stabilized -Continue to follow electrolytes trend. -Will discontinue telemetry. -Electrolytes has remained stable..  4-paroxysmal atrial fibrillation -Continue Eliquis, beta-blocker and Cardizem. -Overall rate controlled and stable.  5-chronic diastolic heart failure -Stable and compensated; no crackles or complaints of orthopnea. -Continue to follow daily weights/low-sodium diet and strict I's and O's. -Continue low-dose diuretic (demadex 20mg  daily)  6-chronic kidney disease a stage IIIb -Creatinine remained stable and at her baseline -Continue to follow renal function trend and stability. -Minimize nephrotoxic agents and the use of contrast as much as possible.  7-essential hypertension -Overall stable and well-controlled -Continue heart healthy/low-sodium diet and current antihypertensive agents.  8-hyperlipidemia -Continue statins. -Continue heart healthy diet.  9-gastroesophageal reflux disease -Continue PPI.  DVT prophylaxis: Chronically on Eliquis. Code Status: DNR/DNI. Family Communication: No family at bedside; unable to leave son over the phone; voicemail message left. Disposition:   Status is: Inpatient   Consultants:  None  Procedures:  See below for x-ray reports.  Antimicrobials/antiviral:  Remdesivir (day 5 out of 5)   Subjective: Afebrile, no chest pain, no nausea, no vomiting.  Stable breathing and expressing just general weakness.  Objective: Vitals:   08/31/21 0500 08/31/21 0748 08/31/21 0759 08/31/21 0805  BP:   (!) 190/84 (!) 190/84   Pulse:   72 72  Resp:   18   Temp:      TempSrc:      SpO2:  96% 98%   Weight: 73.3  kg     Height:        Intake/Output Summary (Last 24 hours) at 08/31/2021 1321 Last data filed at 08/31/2021 0900 Gross per 24 hour  Intake 600 ml  Output 600 ml  Net 0 ml   Filed Weights   08/29/21 0500 08/30/21 0500 08/31/21 0500  Weight: 71.8 kg 71.9 kg 73.3 kg    Examination: General exam: Very hard of hearing, afebrile, denying chest pain and reporting a stable breathing. Respiratory system: Good air movement bilaterally; no using accessory muscles.  Positive scattered rhonchi.  Good saturation on chronic supplementation. Cardiovascular system:RRR. No murmurs, rubs, gallops.  No JVD. Gastrointestinal system: Abdomen is nondistended, soft and nontender. No organomegaly or masses felt. Normal bowel sounds heard. Central nervous system: Generally weak; no focal deficits. Extremities: No cyanosis or clubbing. Skin: No petechiae. Psychiatry: Judgement and insight appears stable overall.   Data Reviewed: I have personally reviewed following labs and imaging studies  CBC: Recent Labs  Lab 08/27/21 1020 08/28/21 0543 08/29/21 0415 08/30/21 0443 08/31/21 0442  WBC 7.9 5.3 10.7* 12.7* 9.0  NEUTROABS 6.4 4.4 9.6* 11.6* 8.3*  HGB 12.8 11.1* 11.8* 11.1* 11.1*  HCT 40.4 34.0* 36.4 34.5* 33.8*  MCV 98.8 96.6 95.0 98.0 96.8  PLT 204 213 231 213 172    Basic Metabolic Panel: Recent Labs  Lab 08/27/21 1020 08/28/21 0543 08/29/21 0415 08/30/21 0443 08/31/21 0442  NA 139 138 137 135 131*  K 3.0* 4.2 4.8 3.9 4.0  CL 97* 98 99 95* 93*  CO2 30 32 31 29 27   GLUCOSE 128* 153* 169* 171* 275*  BUN 16 19 21  31* 36*  CREATININE 1.04* 0.82 0.89 1.04* 1.13*  CALCIUM 9.8 9.8 10.4* 9.7 9.6  MG  --  2.1 2.3 2.2 2.2  PHOS  --  2.7 3.0 2.9 2.5    GFR: Estimated Creatinine Clearance: 32.7 mL/min (A) (by C-G formula based on SCr of 1.13 mg/dL (H)).  Liver Function Tests: Recent Labs  Lab  08/27/21 1020 08/28/21 0543 08/29/21 0415 08/30/21 0443 08/31/21 0442  AST 19 17 19 20 19   ALT 10 11 11 12 15   ALKPHOS 90 78 82 78 74  BILITOT 1.0 1.0 0.9 0.9 0.4  PROT 6.8 6.0* 6.5 6.1* 5.8*  ALBUMIN 3.3* 3.0* 3.3* 3.2* 2.9*    CBG: No results for input(s): GLUCAP in the last 168 hours.   Recent Results (from the past 240 hour(s))  Resp Panel by RT-PCR (Flu A&B, Covid) Nasopharyngeal Swab     Status: Abnormal   Collection Time: 08/27/21 10:32 AM   Specimen: Nasopharyngeal Swab; Nasopharyngeal(NP) swabs in vial transport medium  Result Value Ref Range Status   SARS Coronavirus 2 by RT PCR POSITIVE (A) NEGATIVE Final    Comment: (NOTE) SARS-CoV-2 target nucleic acids are DETECTED.  The SARS-CoV-2 RNA is generally detectable in upper respiratory specimens during the acute phase of infection. Positive results are indicative of the presence of the identified virus, but do not rule out bacterial infection or co-infection with other pathogens not detected by the test. Clinical correlation with patient history and other diagnostic information is necessary to determine patient infection status. The expected result is Negative.  Fact Sheet for Patients: 09/02/21  Fact Sheet for Healthcare Providers:  This test is not yet approved or cleared by the FDA and  has been authorized for detection and/or diagnosis of SARS-CoV-2 by FDA under an Emergency Use Authorization (EUA).  This EUA will remain  in effect (meaning this test can be used) for the duration of  the COVID-19 declaration under Section 564(b)(1) of the A ct, 21 U.S.C. section 360bbb-3(b)(1), unless the authorization is terminated or revoked sooner.     Influenza A by PCR NEGATIVE NEGATIVE Final   Influenza B by PCR NEGATIVE NEGATIVE Final    Comment: (NOTE) The Xpert Xpress SARS-CoV-2/FLU/RSV plus assay is intended as an aid in  the diagnosis of influenza from Nasopharyngeal swab specimens and should not be used as a sole basis for treatment. Nasal washings and aspirates are unacceptable for Xpert Xpress SARS-CoV-2/FLU/RSV testing.  Fact Sheet for Patients: EntrepreneurPulse.com.au  Fact Sheet for Healthcare Providers: IncredibleEmployment.be  This test is not yet approved or cleared by the [place] FDA and has been authorized for detection and/or diagnosis of SARS-CoV-2 by FDA under an Emergency Use Authorization (EUA). This EUA will remain in effect (meaning this test can be used) for the duration of the COVID-19 declaration under Section 564(b)(1) of the Act, 21 U.S.C. section 360bbb-3(b)(1), unless the authorization is terminated or revoked.  Performed at [facility], [address] [place], [place] [address]   Blood Culture (routine x 2)     Status: None (Preliminary result)   Collection Time: 08/27/21  1:08 PM   Specimen: BLOOD  Result Value Ref Range Status   Specimen Description BLOOD BLOOD RIGHT ARM  Final   Special Requests   Final    BOTTLES DRAWN AEROBIC AND ANAEROBIC Blood Culture adequate volume   Culture   Final    NO GROWTH 4 DAYS Performed at [facility], [address], [place], [place] [address]    Report Status PENDING  Incomplete  Blood Culture (routine x 2)     Status: None (Preliminary result)   Collection Time: 08/27/21  1:08 PM   Specimen: BLOOD  Result Value Ref Range Status   Specimen Description BLOOD BLOOD LEFT ARM  Final   Special Requests   Final    BOTTLES DRAWN AEROBIC AND ANAEROBIC Blood Culture adequate volume   Culture   Final    NO GROWTH 4 DAYS Performed at [facility], [address], [place], [place] [address]    Report Status PENDING  Incomplete  Urine Culture     Status: Abnormal   Collection Time: 08/27/21  2:11 PM   Specimen: Urine, Clean Catch  Result Value Ref Range Status   Specimen Description   Final     URINE, CLEAN CATCH Performed at [facility], [address] [place], [place] [address]    Special Requests   Final    NONE Performed at [facility], [address], [place], [place] [address]    Culture MULTIPLE SPECIES PRESENT, SUGGEST RECOLLECTION (A)  Final   Report Status 08/29/2021 FINAL  Final     Radiology Studies: No results found.   Scheduled Meds:  apixaban  5 mg Oral BID   vitamin C  500 mg Oral Daily   atorvastatin  40 mg Oral Daily   diltiazem  180 mg Oral Daily   DULoxetine  60 mg Oral Daily   hydrALAZINE  10 mg Oral BID   Ipratropium-Albuterol  1 puff Inhalation BID   isosorbide mononitrate  30 mg Oral Daily   metoprolol tartrate  50 mg Oral BID   potassium chloride  40 mEq Oral Daily   predniSONE  50 mg Oral Daily   torsemide  20 mg Oral Daily   zinc sulfate  220 mg Oral  Daily   Continuous Infusions:   LOS: 4 days    Barton Dubois, MD [facility]   To contact the attending provider between 7A-7P or the covering provider during after hours 7P-7A, please log into the web site [email or web address] and access using universal [place] password for that web site. If you do not have the password, please call the hospital operator.  08/31/2021, 1:21 PM

## 2021-08-31 NOTE — Progress Notes (Signed)
O2 decreased to 3lpm cann

## 2021-08-31 NOTE — TOC Progression Note (Signed)
Transition of Care [facility]) - Progression Note    Patient Details  Name: Carrie Best MRN: [id number] Date of Birth: February 19, 1928  [facility]) CM/SW Contact  Karn Cassis, [place] Phone Number: 08/31/2021, 1:16 PM  Clinical Narrative:  CMA started authorization this morning and auth received: Reference ID: [id number].  Approved 12/26 - 12/29. LCSW notified Debbie at [facility]. She reports they do not have a COVID bed available until tomorrow. Pt's daughter-in-law and MD updated. Will plan on d/c tomorrow to SNF.      Expected Discharge Plan: Skilled Nursing Facility Barriers to Discharge: No SNF bed (COVID + bed at SNF not available until tomorrow.)  Expected Discharge Plan and Services Expected Discharge Plan: Skilled Nursing Facility                                               Social Determinants of Health (SDOH) Interventions    Readmission Risk Interventions No flowsheet data found.

## 2021-08-31 NOTE — Progress Notes (Signed)
Physical Therapy Treatment Patient Details Name: Carrie Best MRN: [id number] DOB: 1928/05/20 Today's Date: 08/31/2021   History of Present Illness Carrie Best is a 87 y.o. female with medical history significant of anxiety/depression, paroxysmal atrial fibrillation, hypertension, hyperlipidemia, gastroesophageal reflux disease, diastolic heart failure, chronic respiratory failure with nocturnal hypoxia using 3-4 L nasal cannula supplementation at baseline; who presented to the hospital secondary to general malaise, confusion and shortness of breath.  Patient's symptoms have been present for the last 2-3 days and worsening.  Family initially felt patient has UTI based on her presentation and prior similar symptoms with that condition.  They had noted some low-grade temperature and she is requiring up to 5 L in order to keep her saturation above 89%.  There is no chest pain, no nausea, no vomiting, no overt bleeding, new focal deficits or any other complaints.    PT Comments    Pt reports she is overall feeling better but notes increased exertion/fatigue for all activities and requiring min-mod A for bed mobility. Mod-max A needed for sit to stand with difficulty in maintaining RLE weight acceptance which she attributes to chronic right knee issues which has limited her ambulation.  Pt requires max A for safe stand-pivot transfers and requires cues for sequence and safe performance of mobility and demonstrates fatigue/exertion with mobility tasks requiring frequent therapeutic rest periods and cues for pursed-lip breathing.  Pt would benefit from continued therapy services while hospitalized to improve strength and mobility.  Continued rehab recommended in SNF venue to facilitate return to PLOF following more extensive rehabilitation services.     Recommendations for follow up therapy are one component of a multi-disciplinary discharge planning process, led by the attending physician.   Recommendations may be updated based on patient status, additional functional criteria and insurance authorization.  Follow Up Recommendations  Skilled nursing-short term rehab (<3 hours/day)     Assistance Recommended at Discharge Frequent or constant Supervision/Assistance  Equipment Recommendations  None recommended by PT    Recommendations for Other Services       Precautions / Restrictions Restrictions Weight Bearing Restrictions: No     Mobility  Bed Mobility Overal bed mobility: Needs Assistance Bed Mobility: Supine to Sit;Sit to Supine     Supine to sit: Min assist;HOB elevated Sit to supine: Mod assist;HOB elevated   General bed mobility comments: for trunk and lower extremities Patient Response: Cooperative  Transfers Overall transfer level: Needs assistance Equipment used: Rolling walker (2 wheels) Transfers: Sit to/from Stand;Bed to chair/wheelchair/BSC Sit to Stand: Mod assist Stand pivot transfers: Max assist         General transfer comment: difficulty with RLE weight acceptance due to hx of chronic right knee issues    Ambulation/Gait               General Gait Details: not attempted this date. Pt reports she does not typically ambulate   Stairs             Wheelchair Mobility    Modified Rankin (Stroke Patients Only)       Balance Overall balance assessment: Needs assistance Sitting-balance support: Bilateral upper extremity supported;Feet supported Sitting balance-Leahy Scale: Fair     Standing balance support: Bilateral upper extremity supported;During functional activity Standing balance-Leahy Scale: Poor Standing balance comment: fair/poor with HHA and with RW                            Cognition  Arousal/Alertness: Awake/alert Behavior During Therapy: WFL for tasks assessed/performed Overall Cognitive Status: History of cognitive impairments - at baseline                                  General Comments: Neoma Laming does reports recent cognitive decline        Exercises General Exercises - Lower Extremity Ankle Circles/Pumps: AROM;Both;20 reps Quad Sets: Strengthening;Both;20 reps Gluteal Sets: Strengthening;Both;20 reps Heel Slides: AAROM;Both;15 reps    General Comments        Pertinent Vitals/Pain      Home Living                          Prior Function            PT Goals (current goals can now be found in the care plan section) Acute Rehab PT Goals Patient Stated Goal: Rehab and then maybe back home. PT Goal Formulation: With patient/family Time For Goal Achievement: 09/11/21 Potential to Achieve Goals: Fair Progress towards PT goals: Progressing toward goals    Frequency    Min 2X/week      PT Plan Current plan remains appropriate    Co-evaluation              AM-PAC PT "6 Clicks" Mobility   Outcome Measure  Help needed turning from your back to your side while in a flat bed without using bedrails?: A Little Help needed moving from lying on your back to sitting on the side of a flat bed without using bedrails?: A Little Help needed moving to and from a bed to a chair (including a wheelchair)?: A Lot Help needed standing up from a chair using your arms (e.g., wheelchair or bedside chair)?: A Lot Help needed to walk in hospital room?: Total Help needed climbing 3-5 steps with a railing? : Total 6 Click Score: 12    End of Session Equipment Utilized During Treatment: Gait belt;Oxygen Activity Tolerance: Patient limited by fatigue;Patient tolerated treatment well Patient left: in bed;with call bell/phone within reach;with bed alarm set Nurse Communication: Mobility status PT Visit Diagnosis: Unsteadiness on feet (R26.81);Muscle weakness (generalized) (M62.81);Other abnormalities of gait and mobility (R26.89);Difficulty in walking, not elsewhere classified (R26.2)     Time: 1030-1100 PT Time Calculation (min) (ACUTE  ONLY): 30 min  Charges:  $Therapeutic Exercise: 8-22 mins $Therapeutic Activity: 8-22 mins                     11:06 AM, 08/31/21 M. Sherlyn Lees, PT, DPT Physical Therapist- [place] Office Number: [phone]

## 2021-08-31 NOTE — Care Management Important Message (Signed)
Important Message  Patient Details  Name: LANI HAVLIK MRN: [id number] Date of Birth: 1928-06-20   Medicare Important Message Given:  Other (see comment)  Left message with Wendell Fiebig at [phone] to review Medicare IM. Encouraged a callback.  Will attempt at a later time.    Johnell Comings 08/31/2021, 5:04 PM

## 2021-09-01 DIAGNOSIS — R7303 Prediabetes: Secondary | ICD-10-CM | POA: Diagnosis not present

## 2021-09-01 DIAGNOSIS — Z974 Presence of external hearing-aid: Secondary | ICD-10-CM | POA: Diagnosis not present

## 2021-09-01 DIAGNOSIS — J90 Pleural effusion, not elsewhere classified: Secondary | ICD-10-CM | POA: Diagnosis not present

## 2021-09-01 DIAGNOSIS — M6281 Muscle weakness (generalized): Secondary | ICD-10-CM | POA: Diagnosis not present

## 2021-09-01 DIAGNOSIS — R2689 Other abnormalities of gait and mobility: Secondary | ICD-10-CM | POA: Diagnosis not present

## 2021-09-01 DIAGNOSIS — Z7401 Bed confinement status: Secondary | ICD-10-CM | POA: Diagnosis not present

## 2021-09-01 DIAGNOSIS — R0902 Hypoxemia: Secondary | ICD-10-CM | POA: Diagnosis not present

## 2021-09-01 DIAGNOSIS — F0394 Unspecified dementia, unspecified severity, with anxiety: Secondary | ICD-10-CM | POA: Diagnosis not present

## 2021-09-01 DIAGNOSIS — I10 Essential (primary) hypertension: Secondary | ICD-10-CM | POA: Diagnosis not present

## 2021-09-01 DIAGNOSIS — G9340 Encephalopathy, unspecified: Secondary | ICD-10-CM | POA: Diagnosis not present

## 2021-09-01 DIAGNOSIS — J9601 Acute respiratory failure with hypoxia: Secondary | ICD-10-CM | POA: Diagnosis not present

## 2021-09-01 DIAGNOSIS — E871 Hypo-osmolality and hyponatremia: Secondary | ICD-10-CM | POA: Diagnosis not present

## 2021-09-01 DIAGNOSIS — R279 Unspecified lack of coordination: Secondary | ICD-10-CM | POA: Diagnosis not present

## 2021-09-01 DIAGNOSIS — E876 Hypokalemia: Secondary | ICD-10-CM | POA: Diagnosis not present

## 2021-09-01 DIAGNOSIS — R0602 Shortness of breath: Secondary | ICD-10-CM | POA: Diagnosis not present

## 2021-09-01 DIAGNOSIS — E1122 Type 2 diabetes mellitus with diabetic chronic kidney disease: Secondary | ICD-10-CM | POA: Diagnosis not present

## 2021-09-01 DIAGNOSIS — Z9181 History of falling: Secondary | ICD-10-CM | POA: Diagnosis not present

## 2021-09-01 DIAGNOSIS — R739 Hyperglycemia, unspecified: Secondary | ICD-10-CM

## 2021-09-01 DIAGNOSIS — I130 Hypertensive heart and chronic kidney disease with heart failure and stage 1 through stage 4 chronic kidney disease, or unspecified chronic kidney disease: Secondary | ICD-10-CM | POA: Diagnosis not present

## 2021-09-01 DIAGNOSIS — R5381 Other malaise: Secondary | ICD-10-CM | POA: Diagnosis not present

## 2021-09-01 DIAGNOSIS — I5032 Chronic diastolic (congestive) heart failure: Secondary | ICD-10-CM | POA: Diagnosis not present

## 2021-09-01 DIAGNOSIS — J1282 Pneumonia due to coronavirus disease 2019: Secondary | ICD-10-CM | POA: Diagnosis not present

## 2021-09-01 DIAGNOSIS — R531 Weakness: Secondary | ICD-10-CM

## 2021-09-01 DIAGNOSIS — Z823 Family history of stroke: Secondary | ICD-10-CM | POA: Diagnosis not present

## 2021-09-01 DIAGNOSIS — F0393 Unspecified dementia, unspecified severity, with mood disturbance: Secondary | ICD-10-CM | POA: Diagnosis not present

## 2021-09-01 DIAGNOSIS — I482 Chronic atrial fibrillation, unspecified: Secondary | ICD-10-CM | POA: Diagnosis not present

## 2021-09-01 DIAGNOSIS — E785 Hyperlipidemia, unspecified: Secondary | ICD-10-CM | POA: Diagnosis not present

## 2021-09-01 DIAGNOSIS — J189 Pneumonia, unspecified organism: Secondary | ICD-10-CM | POA: Diagnosis present

## 2021-09-01 DIAGNOSIS — Z96651 Presence of right artificial knee joint: Secondary | ICD-10-CM | POA: Diagnosis not present

## 2021-09-01 DIAGNOSIS — Z743 Need for continuous supervision: Secondary | ICD-10-CM | POA: Diagnosis not present

## 2021-09-01 DIAGNOSIS — H9190 Unspecified hearing loss, unspecified ear: Secondary | ICD-10-CM | POA: Diagnosis not present

## 2021-09-01 DIAGNOSIS — Z66 Do not resuscitate: Secondary | ICD-10-CM | POA: Diagnosis not present

## 2021-09-01 DIAGNOSIS — Z794 Long term (current) use of insulin: Secondary | ICD-10-CM | POA: Diagnosis not present

## 2021-09-01 DIAGNOSIS — Z87442 Personal history of urinary calculi: Secondary | ICD-10-CM | POA: Diagnosis not present

## 2021-09-01 DIAGNOSIS — R262 Difficulty in walking, not elsewhere classified: Secondary | ICD-10-CM | POA: Diagnosis not present

## 2021-09-01 DIAGNOSIS — F0390 Unspecified dementia without behavioral disturbance: Secondary | ICD-10-CM | POA: Diagnosis not present

## 2021-09-01 DIAGNOSIS — Z8616 Personal history of COVID-19: Secondary | ICD-10-CM | POA: Diagnosis not present

## 2021-09-01 DIAGNOSIS — I480 Paroxysmal atrial fibrillation: Secondary | ICD-10-CM | POA: Diagnosis not present

## 2021-09-01 DIAGNOSIS — N1832 Chronic kidney disease, stage 3b: Secondary | ICD-10-CM | POA: Diagnosis not present

## 2021-09-01 DIAGNOSIS — A419 Sepsis, unspecified organism: Secondary | ICD-10-CM | POA: Diagnosis not present

## 2021-09-01 DIAGNOSIS — Y95 Nosocomial condition: Secondary | ICD-10-CM | POA: Diagnosis not present

## 2021-09-01 DIAGNOSIS — U071 COVID-19: Secondary | ICD-10-CM | POA: Diagnosis not present

## 2021-09-01 LAB — GLUCOSE, CAPILLARY
Glucose-Capillary: 484 mg/dL — ABNORMAL HIGH (ref 70–99)
Glucose-Capillary: 509 mg/dL (ref 70–99)
Glucose-Capillary: 381 mg/dL — ABNORMAL HIGH (ref 70–99)

## 2021-09-01 LAB — MAGNESIUM: Magnesium: 2.3 mg/dL (ref 1.7–2.4)

## 2021-09-01 LAB — CBC WITH DIFFERENTIAL/PLATELET
Abs Immature Granulocytes: 0.09 10*3/uL — ABNORMAL HIGH (ref 0.00–0.07)
Basophils Absolute: 0 10*3/uL (ref 0.0–0.1)
Basophils Relative: 0 %
Eosinophils Absolute: 0 10*3/uL (ref 0.0–0.5)
Eosinophils Relative: 0 %
HCT: 34.5 % — ABNORMAL LOW (ref 36.0–46.0)
Hemoglobin: 11 g/dL — ABNORMAL LOW (ref 12.0–15.0)
Immature Granulocytes: 1 %
Lymphocytes Relative: 7 %
Lymphs Abs: 0.8 10*3/uL (ref 0.7–4.0)
MCH: 30.5 pg (ref 26.0–34.0)
MCHC: 31.9 g/dL (ref 30.0–36.0)
MCV: 95.6 fL (ref 80.0–100.0)
Monocytes Absolute: 0.7 10*3/uL (ref 0.1–1.0)
Monocytes Relative: 6 %
Neutro Abs: 9.7 10*3/uL — ABNORMAL HIGH (ref 1.7–7.7)
Neutrophils Relative %: 86 %
Platelets: 176 10*3/uL (ref 150–400)
RBC: 3.61 MIL/uL — ABNORMAL LOW (ref 3.87–5.11)
RDW: 14 % (ref 11.5–15.5)
WBC: 11.3 10*3/uL — ABNORMAL HIGH (ref 4.0–10.5)
nRBC: 0 % (ref 0.0–0.2)

## 2021-09-01 LAB — COMPREHENSIVE METABOLIC PANEL
ALT: 17 U/L (ref 0–44)
AST: 17 U/L (ref 15–41)
Albumin: 2.9 g/dL — ABNORMAL LOW (ref 3.5–5.0)
Alkaline Phosphatase: 73 U/L (ref 38–126)
Anion gap: 6 (ref 5–15)
BUN: 38 mg/dL — ABNORMAL HIGH (ref 8–23)
CO2: 34 mmol/L — ABNORMAL HIGH (ref 22–32)
Calcium: 9.6 mg/dL (ref 8.9–10.3)
Chloride: 95 mmol/L — ABNORMAL LOW (ref 98–111)
Creatinine, Ser: 1.16 mg/dL — ABNORMAL HIGH (ref 0.44–1.00)
GFR, Estimated: 45 mL/min — ABNORMAL LOW (ref >60)
Glucose, Bld: 335 mg/dL — ABNORMAL HIGH (ref 70–99)
Potassium: 5.1 mmol/L (ref 3.5–5.1)
Sodium: 135 mmol/L (ref 135–145)
Total Bilirubin: 0.8 mg/dL (ref 0.3–1.2)
Total Protein: 5.7 g/dL — ABNORMAL LOW (ref 6.5–8.1)

## 2021-09-01 LAB — D-DIMER, QUANTITATIVE: D-Dimer, Quant: 2.49 ug/mL-FEU — ABNORMAL HIGH (ref 0.00–0.50)

## 2021-09-01 LAB — GLUCOSE, RANDOM: Glucose, Bld: 529 mg/dL (ref 70–99)

## 2021-09-01 LAB — HEMOGLOBIN A1C
Hgb A1c MFr Bld: 6.1 % — ABNORMAL HIGH (ref 4.8–5.6)
Mean Plasma Glucose: 128.37 mg/dL

## 2021-09-01 LAB — C-REACTIVE PROTEIN: CRP: 0.6 mg/dL (ref <1.0)

## 2021-09-01 LAB — FERRITIN: Ferritin: 135 ng/mL (ref 11–307)

## 2021-09-01 LAB — PHOSPHORUS: Phosphorus: 2.2 mg/dL — ABNORMAL LOW (ref 2.5–4.6)

## 2021-09-01 MED ORDER — ASCORBIC ACID 500 MG PO TABS: 500.0000 mg | ORAL_TABLET | Freq: Every day | ORAL | Status: AC

## 2021-09-01 MED ORDER — INSULIN ASPART 100 UNIT/ML IJ SOLN
12.0000 [IU] | Freq: Once | INTRAMUSCULAR | Status: AC
  Administered 2021-09-01: 14:00:00 12 [IU] via SUBCUTANEOUS

## 2021-09-01 MED ORDER — PREDNISONE 20 MG PO TABS: 40.0000 mg | ORAL_TABLET | Freq: Every day | ORAL | Status: DC

## 2021-09-01 MED ORDER — TRAMADOL HCL 50 MG PO TABS: 50.0000 mg | ORAL_TABLET | Freq: Three times a day (TID) | ORAL | 0 refills | Status: DC | PRN

## 2021-09-01 MED ORDER — INSULIN DETEMIR 100 UNIT/ML ~~LOC~~ SOLN: 7.0000 [IU] | Freq: Every day | SUBCUTANEOUS | Status: DC

## 2021-09-01 MED ORDER — INSULIN ASPART 100 UNIT/ML IJ SOLN: 0.0000 [IU] | Freq: Every day | INTRAMUSCULAR | Status: DC

## 2021-09-01 MED ORDER — ZINC SULFATE 220 (50 ZN) MG PO CAPS: 220.0000 mg | ORAL_CAPSULE | Freq: Every day | ORAL | Status: AC

## 2021-09-01 MED ORDER — TORSEMIDE 20 MG PO TABS
20.0000 mg | ORAL_TABLET | Freq: Every day | ORAL | Status: DC
Start: 2021-09-01 — End: 2021-09-14

## 2021-09-01 MED ORDER — INSULIN ASPART 100 UNIT/ML IJ SOLN
0.0000 [IU] | Freq: Three times a day (TID) | INTRAMUSCULAR | Status: DC
  Administered 2021-09-01: 12:00:00 6 [IU] via SUBCUTANEOUS
  Administered 2021-09-01: 17:00:00 5 [IU] via SUBCUTANEOUS

## 2021-09-01 MED ORDER — LORAZEPAM 0.5 MG PO TABS: 0.5000 mg | ORAL_TABLET | Freq: Three times a day (TID) | ORAL | 0 refills | Status: DC | PRN

## 2021-09-01 MED ORDER — INSULIN ASPART 100 UNIT/ML IJ SOLN: 0.0000 [IU] | Freq: Three times a day (TID) | INTRAMUSCULAR | Status: DC

## 2021-09-01 MED ORDER — INSULIN DETEMIR 100 UNIT/ML ~~LOC~~ SOLN
7.0000 [IU] | Freq: Every day | SUBCUTANEOUS | Status: DC
  Administered 2021-09-01: 17:00:00 7 [IU] via SUBCUTANEOUS
  Filled 2021-09-01 (×2): qty 0.07

## 2021-09-01 MED ORDER — GUAIFENESIN-DM 100-10 MG/5ML PO SYRP: 10.0000 mL | ORAL_SOLUTION | ORAL | Status: AC | PRN

## 2021-09-01 MED ORDER — IPRATROPIUM-ALBUTEROL 20-100 MCG/ACT IN AERS: 1.0000 | INHALATION_SPRAY | Freq: Two times a day (BID) | RESPIRATORY_TRACT | Status: AC

## 2021-09-01 NOTE — Discharge Summary (Signed)
Physician Discharge Summary  Carrie Best [id number] DOB: Jul 13, 1928 DOA: 08/27/2021  PCP: [name], MD  Admit date: 08/27/2021 Discharge date: 09/01/2021  Time spent: 35 minutes  Recommendations for Outpatient Follow-up:  Close monitoring to patient's CBG recommended with further adjustment to hypoglycemic regimen as required. Follow-up with PCP in 10 days after discharge from the skilled nursing facility Repeat basic metabolic panel in 5 days to follow electrolytes and renal function Reassess blood pressure and adjust antihypertensive regimen as needed Follow daily weights Heart healthy/modified carbohydrate diet.  Discharge Diagnoses:  Principal Problem:   COVID-19 virus infection Active Problems:   HTN (hypertension)   GERD (gastroesophageal reflux disease)   Hyperlipidemia with target LDL less than 70   Atrial fibrillation (HCC)   Hyperglycemia   Chronic diastolic CHF (congestive heart failure) (HCC)   Acute on chronic respiratory failure with hypoxia (HCC)   Chronic renal disease, stage III (HCC)   Prediabetes   Weakness   Discharge Condition: Stable and improved.  Discharge to skilled nursing facility for further care and rehabilitation.  CODE STATUS: DNR  Diet recommendation: Modified carbohydrates and heart healthy diet.  Filed Weights   08/30/21 0500 08/31/21 0500 09/01/21 0600  Weight: 71.9 kg 73.3 kg 75.8 kg    History of present illness:  Carrie Best is a 87 y.o. female with medical history significant of anxiety/depression, paroxysmal atrial fibrillation, hypertension, hyperlipidemia, gastroesophageal reflux disease, diastolic heart failure, chronic respiratory failure with nocturnal hypoxia using 3-4 L nasal cannula supplementation at baseline; who presented to the hospital secondary to general malaise, confusion and shortness of breath.  Patient's symptoms have been present for the last 2-3 days and worsening.  Family initially felt  patient has UTI based on her presentation and prior similar symptoms with that condition.  They had noted some low-grade temperature and she is requiring up to 5 L in order to keep her saturation above 89%.  There is no chest pain, no nausea, no vomiting, no overt bleeding, new focal deficits or any other complaints.   Patient is vaccinated against COVID, but no boosted   ED Course: Chest x-ray demonstrating son lateral right hemithorax that could reflect loculated pleural fluid versus pleural mass; Potassium 3.0, ferritin 143, LDH 155, procalcitonin < 0.10, D-dimer 1.63 positive respiratory panel for COVID PCR; CRP pending.  Given hypoxia and advanced age patient has been started on steroids and remdesivir.  [facility] has been called to admit to the hospital for further evaluation and management of COVID-19 pneumonia.   Hospital Course:  1-acute on chronic respiratory failure with hypoxia secondary to COVID-19 virus infection/pneumonia. -Continue the use of vitamin C and zinc -Continue home O2 supplementation (chronically using 3-4 L nasal cannula supplementation at baseline).. -Patient completed treatment of remdesivir (5 days infusion); will be discharged on oral steroids tapering, as needed bronchodilators and mucolytic management. -Overall responded appropriately is currently stable to be discharged..   2-generalized weakness/deconditioning -Further exacerbated from baseline in the setting of acute COVID-19 infection/pneumonia. -Continue treatment as mentioned above -Physical therapy has evaluated patient with recommendation for a skilled nursing facility at discharge. -TOC has been made aware. -Patient will be [facility] to [facility] a skilled nursing facility for further care, rehabilitation and conditioning.   3-hypokalemia -Repleted and currently stabilized -Continue to follow electrolytes trend. -Continue daily supplementation.   4-paroxysmal atrial fibrillation -Continue  Eliquis, beta-blocker and Cardizem. -Overall rate controlled and stable.   5-chronic diastolic heart failure -Stable and compensated; no crackles or complaints of  orthopnea currently. -Continue to follow daily weights and low-sodium diet. -Continue low-dose diuretic (demadex 20mg  daily)   6-chronic kidney disease stage IIIb -Creatinine remained stable and at her baseline -Repeat basic metabolic panel to follow electrolytes and renal function and stability -Minimize nephrotoxic agents and the use of contrast as much as possible.   7-essential hypertension -Overall stable and well-controlled -Continue heart healthy/low-sodium diet and current antihypertensive agents.   8-hyperlipidemia -Continue statins. -Continue heart healthy diet.   9-gastroesophageal reflux disease -Continue PPI.  10-hyperglycemia: Patient with prior history of prediabetes -A1c has been ordered and pending at discharge; please follow final results. -Patient elevated CBGs most likely in the setting of his steroids usage -At discharge will recommend acute therapy with sliding scale insulin and very low-dose of long-acting insulin. -Close monitoring of patient's CBGs along with modified carbohydrate diet -Anticipating not need in future insulin after steroid therapy completed.  Procedures: See below for x-ray reports  Consultations: None  Discharge Exam: Vitals:   09/01/21 0758 09/01/21 1407  BP:  (!) 150/69  Pulse:  75  Resp:  16  Temp:  98.6 F (37 C)  SpO2: 96% 94%   General exam: Very hard of hearing, afebrile, denying chest pain and reporting a stable breathing. Respiratory system: Good air movement bilaterally; no using accessory muscles.  Positive scattered rhonchi.  Good saturation on chronic supplementation. Cardiovascular system:RRR. No murmurs, rubs, gallops.  No JVD. Gastrointestinal system: Abdomen is nondistended, soft and nontender. No organomegaly or masses felt. Normal bowel sounds  heard. Central nervous system: Generally weak; no focal deficits. Extremities: No cyanosis or clubbing. Skin: No petechiae. Psychiatry: Judgement and insight appears stable overall.   Discharge Instructions   Discharge Instructions     (HEART FAILURE PATIENTS) Call MD:  Anytime you have any of the following symptoms: 1) 3 pound weight gain in 24 hours or 5 pounds in 1 week 2) shortness of breath, with or without a dry hacking cough 3) swelling in the hands, feet or stomach 4) if you have to sleep on extra pillows at night in order to breathe.   Complete by: As directed    Diet - low sodium heart healthy   Complete by: As directed    Diet Carb Modified   Complete by: As directed    Discharge instructions   Complete by: As directed    Maintain adequate hydration Close monitoring to patient's CBG recommended with further adjustment to hypoglycemic regimen as required. Follow-up with PCP in 10 days after discharge from the skilled nursing facility Repeat basic metabolic panel in 5 days to follow electrolytes and renal function Reassess blood pressure and adjust antihypertensive regimen as needed Follow daily weights Heart healthy/modified carbohydrate diet.   Increase activity slowly   Complete by: As directed       Allergies as of 09/01/2021       Reactions   Codeine Nausea And Vomiting   Elemental Sulfur Nausea And Vomiting   Penicillins Hives   Has patient had a PCN reaction causing immediate rash, facial/tongue/throat swelling, SOB or lightheadedness with hypotension: No Has patient had a PCN reaction causing severe rash involving mucus membranes or skin necrosis: No Has patient had a PCN reaction that required hospitalization No Has patient had a PCN reaction occurring within the last 10 years: No If all of the above answers are "NO", then may proceed with Cephalosporin use.        Medication List     STOP taking these medications  HYDROcodone-acetaminophen  5-325 MG tablet Commonly known as: Norco   zolpidem 10 MG tablet Commonly known as: AMBIEN       TAKE these medications    apixaban 5 MG Tabs tablet Commonly known as: ELIQUIS Take 1 tablet (5 mg total) by mouth 2 (two) times daily.   ascorbic acid 500 MG tablet Commonly known as: VITAMIN C Take 1 tablet (500 mg total) by mouth daily. Start taking on: September 02, 2021   atorvastatin 40 MG tablet Commonly known as: LIPITOR Take 1 tablet (40 mg total) by mouth daily.   diltiazem 180 MG 24 hr capsule Commonly known as: CARDIZEM CD Take 180 mg by mouth daily.   DULoxetine 60 MG capsule Commonly known as: CYMBALTA Take 60 mg by mouth daily.   guaiFENesin-dextromethorphan 100-10 MG/5ML syrup Commonly known as: ROBITUSSIN DM Take 10 mLs by mouth every 4 (four) hours as needed for cough.   hydrALAZINE 10 MG tablet Commonly known as: APRESOLINE Take 1 tablet (10 mg total) by mouth 2 (two) times daily.   insulin aspart 100 UNIT/ML injection Commonly known as: novoLOG Inject 0-5 Units into the skin at bedtime. CBG 70-200 provide 0 units CBG 201-250 provide 2 units CBG 251-300 provide 3 units CBG 301-350 provide 4 units CBG 351-400 provide 5 units CBG over 400 please contact medical provider.   insulin aspart 100 UNIT/ML injection Commonly known as: novoLOG Inject 0-6 Units into the skin 3 (three) times daily with meals. CBG is 70-120 provide 0 units CBG 121-150 provide 1 unit CBG 151-250 provide 2 units CBG 251-300 provide 3 units CBG 301-350 provide 4 units CBG more than 400 provide 6 units and call medical provider.   insulin detemir 100 UNIT/ML injection Commonly known as: LEVEMIR Inject 0.07 mLs (7 Units total) into the skin daily.   Ipratropium-Albuterol 20-100 MCG/ACT Aers respimat Commonly known as: COMBIVENT Inhale 1 puff into the lungs 2 (two) times daily.   isosorbide mononitrate 30 MG 24 hr tablet Commonly known as: IMDUR TAKE 1 TABLET(30 MG) BY  MOUTH DAILY   LORazepam 0.5 MG tablet Commonly known as: ATIVAN Take 1 tablet (0.5 mg total) by mouth 3 (three) times daily as needed.   metoprolol tartrate 50 MG tablet Commonly known as: LOPRESSOR Take 50 mg by mouth 2 (two) times daily.   polyethylene glycol 17 g packet Commonly known as: MIRALAX / GLYCOLAX Take 17 g by mouth daily.   Potassium Chloride ER 20 MEQ Tbcr Take 1 tablet by mouth daily.   predniSONE 20 MG tablet Commonly known as: DELTASONE Take 2 tablets (40 mg total) by mouth daily. Take 2 tablets by mouth daily x1 day; then 1 tablet by mouth daily x3 days; then half tablet by mouth daily x3 days and stop prednisone. Start taking on: September 02, 2021   torsemide 20 MG tablet Commonly known as: Demadex Take 1 tablet (20 mg total) by mouth daily. What changed:  when to take this additional instructions   traMADol 50 MG tablet Commonly known as: ULTRAM Take 1 tablet (50 mg total) by mouth every 8 (eight) hours as needed for severe pain.   zinc sulfate 220 (50 Zn) MG capsule Take 1 capsule (220 mg total) by mouth daily. Start taking on: September 02, 2021       Allergies  Allergen Reactions   Codeine Nausea And Vomiting   Elemental Sulfur Nausea And Vomiting   Penicillins Hives    Has patient had a PCN reaction causing immediate rash,  facial/tongue/throat swelling, SOB or lightheadedness with hypotension: No Has patient had a PCN reaction causing severe rash involving mucus membranes or skin necrosis: No Has patient had a PCN reaction that required hospitalization No Has patient had a PCN reaction occurring within the last 10 years: No If all of the above answers are "NO", then may proceed with Cephalosporin use.     Contact information for follow-up providers     Kirby Funk, MD. Schedule an appointment as soon as possible for a visit in 10 day(s).   Specialty: Internal Medicine Why: after discharge from SNF. Contact information: [address] [place] [address] [phone]         Nahser, Deloris Ping, MD .   Specialty: Cardiology Contact information: [address] [place] [address] [phone]              Contact information for after-discharge care     Destination     [facility] .   Service: Skilled Nursing Contact information: [address] [place] [address] [phone]                     The results of significant diagnostics from this hospitalization (including imaging, microbiology, ancillary and laboratory) are listed below for reference.    Significant Diagnostic Studies: DG Chest Port 1 View  Result Date: 08/27/2021 CLINICAL DATA:  Weakness, cough, shortness of breath EXAM: PORTABLE CHEST 1 VIEW COMPARISON:  Chest radiograph 06/16/2021 FINDINGS: The cardiomediastinal silhouette is within normal limits. There is density projecting over the lateral right hemithorax which may reflect an extrapleural mass or loculated pleural fluid. There is hazy density in the adjacent right lower lobe. The left lung is clear. There is no left effusion. There is no pneumothorax. Postsurgical changes are seen in the right humerus. IMPRESSION: Density projecting over the lateral right hemithorax may reflect loculated pleural fluid or a pleural-based mass. There is hazy opacity in the adjacent right lower lobe. Recommend CT chest (preferably with contrast) for further evaluation. Electronically Signed   By: Lesia Hausen M.D.   On: 08/27/2021 12:12    Microbiology: Recent Results (from the past 240 hour(s))  Resp Panel by RT-PCR (Flu A&B, Covid) Nasopharyngeal Swab     Status: Abnormal   Collection Time: 08/27/21 10:32 AM   Specimen: Nasopharyngeal Swab; Nasopharyngeal(NP) swabs in vial transport medium  Result Value Ref Range Status   SARS Coronavirus 2 by RT PCR POSITIVE (A) NEGATIVE Final    Comment:  (NOTE) SARS-CoV-2 target nucleic acids are DETECTED.  The SARS-CoV-2 RNA is generally detectable in upper respiratory specimens during the acute phase of infection. Positive results are indicative of the presence of the identified virus, but do not rule out bacterial infection or co-infection with other pathogens not detected by the test. Clinical correlation with patient history and other diagnostic information is necessary to determine patient infection status. The expected result is Negative.  Fact Sheet for Patients: BloggerCourse.com  Fact Sheet for Healthcare Providers: SeriousBroker.it  This test is not yet approved or cleared by the [place] FDA and  has been authorized for detection and/or diagnosis of SARS-CoV-2 by FDA under an Emergency Use Authorization (EUA).  This EUA will remain in effect (meaning this test can be used) for the duration of  the COVID-19 declaration under Section 564(b)(1) of the A ct, 21 U.S.C. section 360bbb-3(b)(1), unless the authorization is terminated or revoked sooner.  Influenza A by PCR NEGATIVE NEGATIVE Final   Influenza B by PCR NEGATIVE NEGATIVE Final    Comment: (NOTE) The Xpert Xpress SARS-CoV-2/FLU/RSV plus assay is intended as an aid in the diagnosis of influenza from Nasopharyngeal swab specimens and should not be used as a sole basis for treatment. Nasal washings and aspirates are unacceptable for Xpert Xpress SARS-CoV-2/FLU/RSV testing.  Fact Sheet for Patients: EntrepreneurPulse.com.au  Fact Sheet for Healthcare Providers: IncredibleEmployment.be  This test is not yet approved or cleared by the [place] FDA and has been authorized for detection and/or diagnosis of SARS-CoV-2 by FDA under an Emergency Use Authorization (EUA). This EUA will remain in effect (meaning this test can be used) for the duration of the COVID-19  declaration under Section 564(b)(1) of the Act, 21 U.S.C. section 360bbb-3(b)(1), unless the authorization is terminated or revoked.  Performed at [facility], [address], [place], [place] [address]   Blood Culture (routine x 2)     Status: None (Preliminary result)   Collection Time: 08/27/21  1:08 PM   Specimen: BLOOD  Result Value Ref Range Status   Specimen Description BLOOD BLOOD RIGHT ARM  Final   Special Requests   Final    BOTTLES DRAWN AEROBIC AND ANAEROBIC Blood Culture adequate volume   Culture   Final    NO GROWTH 4 DAYS Performed at [facility], [address] [place], [place] [address]    Report Status PENDING  Incomplete  Blood Culture (routine x 2)     Status: None (Preliminary result)   Collection Time: 08/27/21  1:08 PM   Specimen: BLOOD  Result Value Ref Range Status   Specimen Description BLOOD BLOOD LEFT ARM  Final   Special Requests   Final    BOTTLES DRAWN AEROBIC AND ANAEROBIC Blood Culture adequate volume   Culture   Final    NO GROWTH 4 DAYS Performed at [facility], [address], [place], [place] [address]    Report Status PENDING  Incomplete  Urine Culture     Status: Abnormal   Collection Time: 08/27/21  2:11 PM   Specimen: Urine, Clean Catch  Result Value Ref Range Status   Specimen Description   Final    URINE, CLEAN CATCH Performed at [facility], [address], [place], [place] [address]    Special Requests   Final    NONE Performed at [facility], [address] [place], [place] [address]    Culture MULTIPLE SPECIES PRESENT, SUGGEST RECOLLECTION (A)  Final   Report Status 08/29/2021 FINAL  Final     Labs: Basic Metabolic Panel: Recent Labs  Lab 08/28/21 0543 08/29/21 0415 08/30/21 0443 08/31/21 0442 09/01/21 0557 09/01/21 1204  NA 138 137 135 131* 135  --   K 4.2 4.8 3.9 4.0 5.1  --   CL 98 99 95* 93* 95*  --   CO2 32 31 29 27  34*  --   GLUCOSE 153* 169* 171* 275* 335* 529*  BUN 19 21 31* 36* 38*  --   CREATININE  0.82 0.89 1.04* 1.13* 1.16*  --   CALCIUM 9.8 10.4* 9.7 9.6 9.6  --   MG 2.1 2.3 2.2 2.2 2.3  --   PHOS 2.7 3.0 2.9 2.5 2.2*  --    Liver Function Tests: Recent Labs  Lab 08/28/21 0543 08/29/21 0415 08/30/21 0443 08/31/21 0442 09/01/21 0557  AST 17 19 20 19 17   ALT 11 11 12 15 17   ALKPHOS 78 82  78 74 73  BILITOT 1.0 0.9 0.9 0.4 0.8  PROT 6.0* 6.5 6.1* 5.8* 5.7*  ALBUMIN 3.0* 3.3* 3.2* 2.9* 2.9*   Recent Labs  Lab 08/27/21 1020  LIPASE 26   CBC: Recent Labs  Lab 08/28/21 0543 08/29/21 0415 08/30/21 0443 08/31/21 0442 09/01/21 0557  WBC 5.3 10.7* 12.7* 9.0 11.3*  NEUTROABS 4.4 9.6* 11.6* 8.3* 9.7*  HGB 11.1* 11.8* 11.1* 11.1* 11.0*  HCT 34.0* 36.4 34.5* 33.8* 34.5*  MCV 96.6 95.0 98.0 96.8 95.6  PLT 213 231 213 172 176   BNP (last 3 results) Recent Labs    08/27/21 1020  BNP 392.0*   CBG: Recent Labs  Lab 09/01/21 1147 09/01/21 1338  GLUCAP 484* 509*   Signed:  Barton Dubois MD.  [facility] 09/01/2021, 3:03 PM

## 2021-09-01 NOTE — Progress Notes (Signed)
Inpatient Diabetes Program Recommendations  AACE/ADA: New Consensus Statement on Inpatient Glycemic Control (2015)  Target Ranges:  Prepandial:   less than 140 mg/dL      Peak postprandial:   less than 180 mg/dL (1-2 hours)      Critically ill patients:  140 - 180 mg/dL   Lab Results  Component Value Date   GLUCAP 143 (H) 06/18/2019   HGBA1C 5.9 (H) 06/20/2019    Review of Glycemic Control  Latest Reference Range & Units 08/30/21 04:43 08/31/21 04:42 09/01/21 05:57  Glucose 70 - 99 mg/dL 358 (H) 251 (H) 898 (H)   Diabetes history: None Outpatient Diabetes medications:  None Current orders for Inpatient glycemic control:  Prednisone 50 mg daily  Inpatient Diabetes Program Recommendations:   No history of DM.  Note that glucose increased with steroids.  If appropriate, may consider adding Novolog very sensitive tid with meals and HS.   Thanks,  Beryl Meager, RN, BC-ADM Inpatient Diabetes Coordinator Pager [phone]  (8a-5p)

## 2021-09-01 NOTE — TOC Transition Note (Signed)
Transition of Care [facility]) - CM/SW Discharge Note   Patient Details  Name: Carrie Best MRN: [id number] Date of Birth: [birth year]  Transition of [facility]) CM/SW Contact:  Leitha Bleak, RN Phone Number: 09/01/2021, 1:13 PM   Clinical Narrative:   Patient is medically ready to discharge to SNF. [facility] provided a room number B13-1. RN to call report. TOC will call EMS when RN is ready. TOC updated [name], daughter with discharge plan    Final next level of care: Skilled Nursing Facility Barriers to Discharge: Barriers Resolved   Patient Goals and CMS Choice Patient states their goals for this hospitalization and ongoing recovery are:: agreeable to go to SNF CMS Medicare.gov Compare Post Acute Care list provided to:: Patient Represenative (must comment) Choice offered to / list presented to : Adult Children  Discharge Placement                Patient to be transferred to facility by: EMS Name of family member notified: [name] - daughter Patient and family notified of of transfer: 09/01/21  Discharge Plan and Services   [facility]    Readmission Risk Interventions Readmission Risk Prevention Plan 09/01/2021  Transportation Screening Complete  PCP or Specialist Appt within 5-7 Days Complete  Home Care Screening Complete  Medication Review (RN CM) Complete  Some recent data might be hidden

## 2021-09-01 NOTE — Treatment Plan (Signed)
Patient left via ambulance at 1940 to SNF. IV took out. Vital signs stables. Belongings sent with patient.

## 2021-09-01 NOTE — Progress Notes (Signed)
Blood glucose has been elevated.  After communication with Dr. Gwenlyn Perking he has given orders twice for one time doses of novolog and sliding scale plus levemir 7 units and waiting on the levemir from pharmacy.

## 2021-09-01 NOTE — Progress Notes (Signed)
Report called to Dia at [facility])

## 2021-09-02 LAB — CULTURE, BLOOD (ROUTINE X 2)
Culture: NO GROWTH
Culture: NO GROWTH
Special Requests: ADEQUATE
Special Requests: ADEQUATE

## 2021-09-07 ENCOUNTER — Emergency Department (HOSPITAL_COMMUNITY): Payer: Medicare Other

## 2021-09-07 ENCOUNTER — Inpatient Hospital Stay (HOSPITAL_COMMUNITY)
Admission: EM | Admit: 2021-09-07 | Discharge: 2021-09-15 | DRG: 871 | Payer: Medicare Other | Attending: Internal Medicine | Admitting: Internal Medicine

## 2021-09-07 ENCOUNTER — Encounter (HOSPITAL_COMMUNITY): Payer: Self-pay | Admitting: Emergency Medicine

## 2021-09-07 DIAGNOSIS — I480 Paroxysmal atrial fibrillation: Secondary | ICD-10-CM | POA: Diagnosis not present

## 2021-09-07 DIAGNOSIS — Z823 Family history of stroke: Secondary | ICD-10-CM | POA: Diagnosis not present

## 2021-09-07 DIAGNOSIS — I130 Hypertensive heart and chronic kidney disease with heart failure and stage 1 through stage 4 chronic kidney disease, or unspecified chronic kidney disease: Secondary | ICD-10-CM | POA: Diagnosis not present

## 2021-09-07 DIAGNOSIS — Y95 Nosocomial condition: Secondary | ICD-10-CM | POA: Diagnosis not present

## 2021-09-07 DIAGNOSIS — Z96651 Presence of right artificial knee joint: Secondary | ICD-10-CM | POA: Diagnosis not present

## 2021-09-07 DIAGNOSIS — Z794 Long term (current) use of insulin: Secondary | ICD-10-CM | POA: Diagnosis not present

## 2021-09-07 DIAGNOSIS — Z8616 Personal history of COVID-19: Secondary | ICD-10-CM | POA: Diagnosis not present

## 2021-09-07 DIAGNOSIS — E1122 Type 2 diabetes mellitus with diabetic chronic kidney disease: Secondary | ICD-10-CM | POA: Diagnosis present

## 2021-09-07 DIAGNOSIS — R7303 Prediabetes: Secondary | ICD-10-CM

## 2021-09-07 DIAGNOSIS — E871 Hypo-osmolality and hyponatremia: Secondary | ICD-10-CM | POA: Diagnosis present

## 2021-09-07 DIAGNOSIS — K219 Gastro-esophageal reflux disease without esophagitis: Secondary | ICD-10-CM | POA: Diagnosis present

## 2021-09-07 DIAGNOSIS — F0394 Unspecified dementia, unspecified severity, with anxiety: Secondary | ICD-10-CM | POA: Diagnosis present

## 2021-09-07 DIAGNOSIS — F0393 Unspecified dementia, unspecified severity, with mood disturbance: Secondary | ICD-10-CM | POA: Diagnosis present

## 2021-09-07 DIAGNOSIS — J9601 Acute respiratory failure with hypoxia: Secondary | ICD-10-CM | POA: Diagnosis not present

## 2021-09-07 DIAGNOSIS — Z7951 Long term (current) use of inhaled steroids: Secondary | ICD-10-CM

## 2021-09-07 DIAGNOSIS — I482 Chronic atrial fibrillation, unspecified: Secondary | ICD-10-CM | POA: Diagnosis not present

## 2021-09-07 DIAGNOSIS — I10 Essential (primary) hypertension: Secondary | ICD-10-CM | POA: Diagnosis not present

## 2021-09-07 DIAGNOSIS — H9190 Unspecified hearing loss, unspecified ear: Secondary | ICD-10-CM | POA: Diagnosis not present

## 2021-09-07 DIAGNOSIS — E785 Hyperlipidemia, unspecified: Secondary | ICD-10-CM | POA: Diagnosis present

## 2021-09-07 DIAGNOSIS — E876 Hypokalemia: Secondary | ICD-10-CM | POA: Diagnosis present

## 2021-09-07 DIAGNOSIS — Z880 Allergy status to penicillin: Secondary | ICD-10-CM

## 2021-09-07 DIAGNOSIS — R0902 Hypoxemia: Secondary | ICD-10-CM | POA: Diagnosis not present

## 2021-09-07 DIAGNOSIS — R5381 Other malaise: Secondary | ICD-10-CM

## 2021-09-07 DIAGNOSIS — A419 Sepsis, unspecified organism: Secondary | ICD-10-CM | POA: Diagnosis not present

## 2021-09-07 DIAGNOSIS — F0390 Unspecified dementia without behavioral disturbance: Secondary | ICD-10-CM | POA: Insufficient documentation

## 2021-09-07 DIAGNOSIS — J449 Chronic obstructive pulmonary disease, unspecified: Secondary | ICD-10-CM | POA: Diagnosis not present

## 2021-09-07 DIAGNOSIS — J1282 Pneumonia due to coronavirus disease 2019: Secondary | ICD-10-CM | POA: Diagnosis not present

## 2021-09-07 DIAGNOSIS — J189 Pneumonia, unspecified organism: Secondary | ICD-10-CM | POA: Diagnosis present

## 2021-09-07 DIAGNOSIS — J90 Pleural effusion, not elsewhere classified: Secondary | ICD-10-CM | POA: Diagnosis not present

## 2021-09-07 DIAGNOSIS — N1832 Chronic kidney disease, stage 3b: Secondary | ICD-10-CM | POA: Diagnosis present

## 2021-09-07 DIAGNOSIS — Z974 Presence of external hearing-aid: Secondary | ICD-10-CM

## 2021-09-07 DIAGNOSIS — I5032 Chronic diastolic (congestive) heart failure: Secondary | ICD-10-CM | POA: Diagnosis present

## 2021-09-07 DIAGNOSIS — Z7952 Long term (current) use of systemic steroids: Secondary | ICD-10-CM

## 2021-09-07 DIAGNOSIS — Z9181 History of falling: Secondary | ICD-10-CM

## 2021-09-07 DIAGNOSIS — Z79899 Other long term (current) drug therapy: Secondary | ICD-10-CM

## 2021-09-07 DIAGNOSIS — Z87442 Personal history of urinary calculi: Secondary | ICD-10-CM

## 2021-09-07 DIAGNOSIS — G9340 Encephalopathy, unspecified: Secondary | ICD-10-CM | POA: Diagnosis present

## 2021-09-07 DIAGNOSIS — R0602 Shortness of breath: Secondary | ICD-10-CM | POA: Diagnosis not present

## 2021-09-07 DIAGNOSIS — Z7901 Long term (current) use of anticoagulants: Secondary | ICD-10-CM

## 2021-09-07 DIAGNOSIS — Z882 Allergy status to sulfonamides status: Secondary | ICD-10-CM

## 2021-09-07 DIAGNOSIS — Z66 Do not resuscitate: Secondary | ICD-10-CM | POA: Diagnosis present

## 2021-09-07 DIAGNOSIS — R739 Hyperglycemia, unspecified: Secondary | ICD-10-CM | POA: Diagnosis not present

## 2021-09-07 DIAGNOSIS — U071 COVID-19: Secondary | ICD-10-CM | POA: Diagnosis not present

## 2021-09-07 DIAGNOSIS — Z743 Need for continuous supervision: Secondary | ICD-10-CM | POA: Diagnosis not present

## 2021-09-07 DIAGNOSIS — Z885 Allergy status to narcotic agent status: Secondary | ICD-10-CM

## 2021-09-07 LAB — CBC WITH DIFFERENTIAL/PLATELET
Abs Immature Granulocytes: 0.13 10*3/uL — ABNORMAL HIGH (ref 0.00–0.07)
Basophils Absolute: 0 10*3/uL (ref 0.0–0.1)
Basophils Relative: 0 %
Eosinophils Absolute: 0.1 10*3/uL (ref 0.0–0.5)
Eosinophils Relative: 1 %
HCT: 35.4 % — ABNORMAL LOW (ref 36.0–46.0)
Hemoglobin: 12.1 g/dL (ref 12.0–15.0)
Immature Granulocytes: 1 %
Lymphocytes Relative: 5 %
Lymphs Abs: 0.7 10*3/uL (ref 0.7–4.0)
MCH: 31.7 pg (ref 26.0–34.0)
MCHC: 34.2 g/dL (ref 30.0–36.0)
MCV: 92.7 fL (ref 80.0–100.0)
Monocytes Absolute: 0.9 10*3/uL (ref 0.1–1.0)
Monocytes Relative: 6 %
Neutro Abs: 12.7 10*3/uL — ABNORMAL HIGH (ref 1.7–7.7)
Neutrophils Relative %: 87 %
Platelets: 178 10*3/uL (ref 150–400)
RBC: 3.82 MIL/uL — ABNORMAL LOW (ref 3.87–5.11)
RDW: 13.9 % (ref 11.5–15.5)
WBC: 14.6 10*3/uL — ABNORMAL HIGH (ref 4.0–10.5)
nRBC: 0 % (ref 0.0–0.2)

## 2021-09-07 LAB — BLOOD GAS, VENOUS
Acid-Base Excess: 9.1 mmol/L — ABNORMAL HIGH (ref 0.0–2.0)
Bicarbonate: 32.2 mmol/L — ABNORMAL HIGH (ref 20.0–28.0)
Drawn by: 6352
FIO2: 36
O2 Saturation: 88.9 %
Patient temperature: 38
pCO2, Ven: 48.1 mmHg (ref 44.0–60.0)
pH, Ven: 7.457 — ABNORMAL HIGH (ref 7.250–7.430)
pO2, Ven: 59.1 mmHg — ABNORMAL HIGH (ref 32.0–45.0)

## 2021-09-07 LAB — COMPREHENSIVE METABOLIC PANEL
ALT: 17 U/L (ref 0–44)
AST: 20 U/L (ref 15–41)
Albumin: 2.8 g/dL — ABNORMAL LOW (ref 3.5–5.0)
Alkaline Phosphatase: 104 U/L (ref 38–126)
Anion gap: 9 (ref 5–15)
BUN: 20 mg/dL (ref 8–23)
CO2: 32 mmol/L (ref 22–32)
Calcium: 8.8 mg/dL — ABNORMAL LOW (ref 8.9–10.3)
Chloride: 89 mmol/L — ABNORMAL LOW (ref 98–111)
Creatinine, Ser: 1.01 mg/dL — ABNORMAL HIGH (ref 0.44–1.00)
GFR, Estimated: 53 mL/min — ABNORMAL LOW (ref >60)
Glucose, Bld: 246 mg/dL — ABNORMAL HIGH (ref 70–99)
Potassium: 3.5 mmol/L (ref 3.5–5.1)
Sodium: 130 mmol/L — ABNORMAL LOW (ref 135–145)
Total Bilirubin: 1 mg/dL (ref 0.3–1.2)
Total Protein: 5.7 g/dL — ABNORMAL LOW (ref 6.5–8.1)

## 2021-09-07 LAB — RESP PANEL BY RT-PCR (FLU A&B, COVID) ARPGX2
Influenza A by PCR: NEGATIVE
Influenza B by PCR: NEGATIVE
SARS Coronavirus 2 by RT PCR: POSITIVE — AB

## 2021-09-07 LAB — LACTIC ACID, PLASMA
Lactic Acid, Venous: 2.6 mmol/L (ref 0.5–1.9)
Lactic Acid, Venous: 2.2 mmol/L (ref 0.5–1.9)

## 2021-09-07 LAB — TROPONIN I (HIGH SENSITIVITY)
Troponin I (High Sensitivity): 22 ng/L — ABNORMAL HIGH (ref <18)
Troponin I (High Sensitivity): 22 ng/L — ABNORMAL HIGH (ref <18)

## 2021-09-07 LAB — GLUCOSE, CAPILLARY: Glucose-Capillary: 152 mg/dL — ABNORMAL HIGH (ref 70–99)

## 2021-09-07 LAB — BRAIN NATRIURETIC PEPTIDE: B Natriuretic Peptide: 237 pg/mL — ABNORMAL HIGH (ref 0.0–100.0)

## 2021-09-07 MED ORDER — POTASSIUM CHLORIDE CRYS ER 20 MEQ PO TBCR
20.0000 meq | EXTENDED_RELEASE_TABLET | Freq: Every day | ORAL | Status: DC
  Administered 2021-09-08 – 2021-09-14 (×7): 20 meq via ORAL
  Filled 2021-09-07 (×9): qty 1

## 2021-09-07 MED ORDER — POLYETHYLENE GLYCOL 3350 17 G PO PACK
17.0000 g | PACK | Freq: Every day | ORAL | Status: DC
  Administered 2021-09-08 – 2021-09-14 (×6): 17 g via ORAL
  Filled 2021-09-07 (×8): qty 1

## 2021-09-07 MED ORDER — METOPROLOL TARTRATE 50 MG PO TABS
50.0000 mg | ORAL_TABLET | Freq: Two times a day (BID) | ORAL | Status: DC
  Administered 2021-09-07 – 2021-09-15 (×15): 50 mg via ORAL
  Filled 2021-09-07 (×16): qty 1

## 2021-09-07 MED ORDER — VANCOMYCIN HCL 1250 MG/250ML IV SOLN: 1250.0000 mg | INTRAVENOUS | Status: DC

## 2021-09-07 MED ORDER — ENOXAPARIN SODIUM 40 MG/0.4ML IJ SOSY: 40.0000 mg | PREFILLED_SYRINGE | INTRAMUSCULAR | Status: DC

## 2021-09-07 MED ORDER — DILTIAZEM HCL ER COATED BEADS 180 MG PO CP24
180.0000 mg | ORAL_CAPSULE | Freq: Every day | ORAL | Status: DC
  Administered 2021-09-08 – 2021-09-15 (×8): 180 mg via ORAL
  Filled 2021-09-07 (×8): qty 1

## 2021-09-07 MED ORDER — APIXABAN 5 MG PO TABS
5.0000 mg | ORAL_TABLET | Freq: Two times a day (BID) | ORAL | Status: DC
  Administered 2021-09-07 – 2021-09-15 (×16): 5 mg via ORAL
  Filled 2021-09-07 (×16): qty 1

## 2021-09-07 MED ORDER — GUAIFENESIN-DM 100-10 MG/5ML PO SYRP: 10.0000 mL | ORAL_SOLUTION | ORAL | Status: DC | PRN

## 2021-09-07 MED ORDER — LORAZEPAM 0.5 MG PO TABS: 0.5000 mg | ORAL_TABLET | Freq: Three times a day (TID) | ORAL | Status: DC | PRN

## 2021-09-07 MED ORDER — INSULIN ASPART 100 UNIT/ML IJ SOLN: 0.0000 [IU] | Freq: Every day | INTRAMUSCULAR | Status: DC

## 2021-09-07 MED ORDER — TRAMADOL HCL 50 MG PO TABS: 50.0000 mg | ORAL_TABLET | Freq: Three times a day (TID) | ORAL | Status: DC | PRN

## 2021-09-07 MED ORDER — SODIUM CHLORIDE 0.9 % IV SOLN: 2.0000 g | INTRAVENOUS | Status: DC

## 2021-09-07 MED ORDER — ACETAMINOPHEN 325 MG PO TABS
650.0000 mg | ORAL_TABLET | Freq: Once | ORAL | Status: AC
  Administered 2021-09-07: 650 mg via ORAL
  Filled 2021-09-07: qty 2

## 2021-09-07 MED ORDER — DULOXETINE HCL 60 MG PO CPEP
60.0000 mg | ORAL_CAPSULE | Freq: Every day | ORAL | Status: DC
  Administered 2021-09-08 – 2021-09-15 (×8): 60 mg via ORAL
  Filled 2021-09-07 (×8): qty 1

## 2021-09-07 MED ORDER — SODIUM CHLORIDE 0.9 % IV SOLN
2.0000 g | Freq: Two times a day (BID) | INTRAVENOUS | Status: DC
  Administered 2021-09-08 – 2021-09-11 (×8): 2 g via INTRAVENOUS
  Filled 2021-09-07 (×8): qty 2

## 2021-09-07 MED ORDER — AZITHROMYCIN 250 MG PO TABS
500.0000 mg | ORAL_TABLET | Freq: Every day | ORAL | Status: DC
  Administered 2021-09-07: 500 mg via ORAL
  Filled 2021-09-07: qty 2

## 2021-09-07 MED ORDER — INSULIN ASPART 100 UNIT/ML IJ SOLN
3.0000 [IU] | Freq: Three times a day (TID) | INTRAMUSCULAR | Status: DC
  Administered 2021-09-08 – 2021-09-14 (×6): 3 [IU] via SUBCUTANEOUS

## 2021-09-07 MED ORDER — ATORVASTATIN CALCIUM 40 MG PO TABS
40.0000 mg | ORAL_TABLET | Freq: Every day | ORAL | Status: DC
  Administered 2021-09-08 – 2021-09-14 (×7): 40 mg via ORAL
  Filled 2021-09-07 (×8): qty 1

## 2021-09-07 MED ORDER — TORSEMIDE 20 MG PO TABS
20.0000 mg | ORAL_TABLET | Freq: Every day | ORAL | Status: DC
  Administered 2021-09-08: 20 mg via ORAL
  Filled 2021-09-07: qty 1

## 2021-09-07 MED ORDER — ASCORBIC ACID 500 MG PO TABS
500.0000 mg | ORAL_TABLET | Freq: Every day | ORAL | Status: DC
  Administered 2021-09-08 – 2021-09-14 (×7): 500 mg via ORAL
  Filled 2021-09-07 (×8): qty 1

## 2021-09-07 MED ORDER — INSULIN ASPART 100 UNIT/ML IJ SOLN
0.0000 [IU] | Freq: Three times a day (TID) | INTRAMUSCULAR | Status: DC
  Administered 2021-09-08: 5 [IU] via SUBCUTANEOUS
  Administered 2021-09-08: 2 [IU] via SUBCUTANEOUS
  Administered 2021-09-09: 3 [IU] via SUBCUTANEOUS
  Administered 2021-09-10: 2 [IU] via SUBCUTANEOUS
  Administered 2021-09-11: 5 [IU] via SUBCUTANEOUS
  Administered 2021-09-12 – 2021-09-13 (×2): 8 [IU] via SUBCUTANEOUS
  Administered 2021-09-13: 2 [IU] via SUBCUTANEOUS
  Administered 2021-09-14: 3 [IU] via SUBCUTANEOUS

## 2021-09-07 MED ORDER — ZINC SULFATE 220 (50 ZN) MG PO CAPS
220.0000 mg | ORAL_CAPSULE | Freq: Every day | ORAL | Status: DC
  Administered 2021-09-08 – 2021-09-14 (×7): 220 mg via ORAL
  Filled 2021-09-07 (×8): qty 1

## 2021-09-07 MED ORDER — LEVOFLOXACIN IN D5W 750 MG/150ML IV SOLN: 750.0000 mg | INTRAVENOUS | Status: DC

## 2021-09-07 MED ORDER — VANCOMYCIN HCL 1500 MG/300ML IV SOLN
1500.0000 mg | Freq: Once | INTRAVENOUS | Status: AC
  Administered 2021-09-07: 1500 mg via INTRAVENOUS
  Filled 2021-09-07: qty 300

## 2021-09-07 MED ORDER — INSULIN DETEMIR 100 UNIT/ML ~~LOC~~ SOLN
7.0000 [IU] | Freq: Every day | SUBCUTANEOUS | Status: DC
  Administered 2021-09-08 – 2021-09-15 (×7): 7 [IU] via SUBCUTANEOUS
  Filled 2021-09-07 (×9): qty 0.07

## 2021-09-07 MED ORDER — ISOSORBIDE MONONITRATE ER 60 MG PO TB24
30.0000 mg | ORAL_TABLET | Freq: Every day | ORAL | Status: DC
  Administered 2021-09-08 – 2021-09-15 (×8): 30 mg via ORAL
  Filled 2021-09-07 (×8): qty 1

## 2021-09-07 MED ORDER — SODIUM CHLORIDE 0.9 % IV SOLN
1.0000 g | Freq: Once | INTRAVENOUS | Status: AC
  Administered 2021-09-07: 1 g via INTRAVENOUS
  Filled 2021-09-07: qty 1

## 2021-09-07 NOTE — Progress Notes (Signed)
Pharmacy Antibiotic Note  Carrie Best is a 88 y.o. female admitted on 09/07/2021 with sepsis.  Pharmacy has been consulted for Vancomycin dosing.  Plan: Vancomycin 1500mg  x 1 then 1250mg  IV Q 36 hrs. Goal AUC 400-550. Expected AUC: 439.1 SCr used: 1.01     Temp (24hrs), Avg:99.1 F (37.3 C), Min:99.1 F (37.3 C), Max:99.1 F (37.3 C)  Recent Labs  Lab 09/01/21 0557 09/07/21 1536  WBC 11.3* 14.6*  CREATININE 1.16* 1.01*    Estimated Creatinine Clearance: 36.6 mL/min (A) (by C-G formula based on SCr of 1.01 mg/dL (H)).    Allergies  Allergen Reactions   Codeine Nausea And Vomiting   Elemental Sulfur Nausea And Vomiting   Penicillins Hives    Has patient had a PCN reaction causing immediate rash, facial/tongue/throat swelling, SOB or lightheadedness with hypotension: No Has patient had a PCN reaction causing severe rash involving mucus membranes or skin necrosis: No Has patient had a PCN reaction that required hospitalization No Has patient had a PCN reaction occurring within the last 10 years: No If all of the above answers are "NO", then may proceed with Cephalosporin use.     Antimicrobials this admission: Cefepime 1/2 >>  Vancomycin 1/2 >>   Dose adjustments this admission:   Microbiology results:  BCx: pending  UCx: pending   Sputum: pending   MRSA PCR: pending  Thank you for allowing pharmacy to be a part of this patients care.  09/03/21 A 09/07/2021 4:29 PM

## 2021-09-07 NOTE — ED Notes (Signed)
Pt.'s grand-daughter requests pt be transferred to [facility] due to family living in the [place] area. MD/PA made aware. PA has talked to grand-daughter about treatment plan. As of right now, pt will be treated at [facility]

## 2021-09-07 NOTE — ED Notes (Signed)
Grand-daughter at bedside states that a hospitalist is supposed to talk to her about her request to move pt to [place]

## 2021-09-07 NOTE — H&P (Signed)
Carrie Best is an 88 y.o. female.   has a past medical history of Anxiety, Arthritis, Congestive heart failure ([place]), Depression, Diastolic dysfunction, Dyspnea, Dysrhythmia (05-10-12), GERD (gastroesophageal reflux disease) (05-10-12), Hearing loss (05-10-12), Heart murmur, History of kidney stones, Hyperlipidemia, Hypertension, Pneumonia (05-10-12), Stroke ([place]) (05-10-12), and Wears hearing aid.  Chief Complaint:  Chief Complaint  Patient presents with   Shortness of Breath    HPI:  Presented via EMS from SNF for evaluation of shortness of breath, altered mental status/increased tiredness, hypoxia as low as 77% on 3 L nasal cannula at SNF.  HPI obtained from granddaughter at bedside, ED provider, chart review.  Patient was difficult to wake up this morning at SNF, low appetite did not eat much of her breakfast, reporting increased thirst.  Admission for COVID illness and was discharged to SNF for rehab.  Currently in ED, patient is arousable but not answering questions appropriately, not participating in interview.  At one point wakes up and asks granddaughter "where is everybody?"  But does not answer questions directed at her.    ASSESSMENT & PLAN:  Respiratory and Infectious: Acute hypoxemic respiratory failure, sepsis, encephalopathy due to healthcare acquired pneumonia, complicated by underlying chronic respiratory failure on 2 to 3 L O2, recent COVID infection Continue O2 support, BiPAP as needed Cefepime, Levaquin, vancomycin   Cardiac: History of chronic congestive heart failure, no active exacerbation. Essential HTN, paroxysmal Afib Last echocardiogram on file 07/25/2020 showed EF 70 to 75% hyperdynamic, normal right ventricular systolic function Continue home torsemide, potassium, metoprolol, isosorbide mononitrate, diltiazem, epiquis  Renal: Chronic kidney disease stage II-III Creatinine currently 1.01, relative baseline  Endocrine: Prediabetes SSI ac/hs  Psychiatric: Dementia  at baseline without history of agitation or severe confusion, anxiety/depression  VTE prophylaxis: On Eliquis for paroxysmal A. fib, continue CODE STATUS: DNR per MOST form on file Dispo: SNF when medically stable Family communication: Granddaughter updated at bedside, granddaughter reports that POA is patient's daughter, Jackelyn Poling who can be reached at [phone]       Past Medical History:  Diagnosis Date   Anxiety    Arthritis    Congestive heart failure (HCC)    Congestive heart failure symptoms   Depression    Diastolic dysfunction    Dyspnea    occasionally, with exertion   Dysrhythmia 05-10-12   hx. A. Fib., rate is controlled-tx. Xarelto   GERD (gastroesophageal reflux disease) 05-10-12   tx. with meds as needed   Hearing loss 05-10-12   bilateral hearing aids.   Heart murmur    History of kidney stones    Hyperlipidemia    Hypertension    Pneumonia 05-10-12   dx. in July- tx. antibiotics   Stroke ([place]) 05-10-12   Lt.CVA note on scans-hx. freq. falls/ none past 6 months 01/21/14   Wears hearing aid    bilateral    Past Surgical History:  Procedure Laterality Date   APPENDECTOMY     CATARACT EXTRACTION, BILATERAL  05-10-12   bilateral   CYSTOSCOPY WITH RETROGRADE PYELOGRAM, URETEROSCOPY AND STENT PLACEMENT Right 01/31/2014   Procedure: CYSTOSCOPY WITH bilateral  RETROGRADE PYELOGRAM, right URETEROSCOPY AND right  STENT PLACEMENT, bladder biopsy with fulgeration;  Surgeon: Alexis Frock, MD;  Location: [facility];  Service: Urology;  Laterality: Right;   HARDWARE REMOVAL Right 09/09/2014   Procedure: HARDWARE REMOVAL RIGHT SHOULDER;  Surgeon: Nita Sells, MD;  Location: [facility];  Service: Orthopedics;  Laterality: Right;  Right shoulder hardware removal   HOLMIUM  LASER APPLICATION Right [id number]   Procedure: HOLMIUM LASER APPLICATION;  Surgeon: Alexis Frock, MD;  Location: [facility];  Service: Urology;  Laterality: Right;   HUMERUS IM NAIL Right  06/10/2014   Procedure: INTRAMEDULLARY (IM) NAIL HUMERAL;  Surgeon: Nita Sells, MD;  Location: [facility];  Service: Orthopedics;  Laterality: Right;  Right intramedullary humeral nail   INTERSTIM IMPLANT PLACEMENT  05-10-12   now malfunctioning   INTERSTIM IMPLANT REMOVAL  05/16/2012   Procedure: REMOVAL OF INTERSTIM IMPLANT;  Surgeon: Reece Packer, MD;  Location: [facility];  Service: Urology;  Laterality: N/A;   JOINT REPLACEMENT     OTHER SURGICAL HISTORY     Bladder Surgery   SHOULDER SURGERY     TOTAL KNEE ARTHROPLASTY Right 02/17/2015   Procedure: TOTAL KNEE ARTHROPLASTY;  Surgeon: Frederik Pear, MD;  Location: [facility];  Service: Orthopedics;  Laterality: Right;    Family History  Problem Relation Age of Onset   Stroke Mother    Stroke Brother    Social History:  reports that she has never smoked. She has never used smokeless tobacco. She reports that she does not drink alcohol and does not use drugs.  Allergies:  Allergies  Allergen Reactions   Codeine Nausea And Vomiting   Elemental Sulfur Nausea And Vomiting   Penicillins Hives    Has patient had a PCN reaction causing immediate rash, facial/tongue/throat swelling, SOB or lightheadedness with hypotension: No Has patient had a PCN reaction causing severe rash involving mucus membranes or skin necrosis: No Has patient had a PCN reaction that required hospitalization No Has patient had a PCN reaction occurring within the last 10 years: No If all of the above answers are "NO", then may proceed with Cephalosporin use.     (Not in a hospital admission)   Results for orders placed or performed during the hospital encounter of 09/07/21 (from the past 48 hour(s))  CBC with Differential     Status: Abnormal   Collection Time: 09/07/21  3:36 PM  Result Value Ref Range   WBC 14.6 (H) 4.0 - 10.5 K/uL   RBC 3.82 (L) 3.87 - 5.11 MIL/uL   Hemoglobin 12.1 12.0 - 15.0 g/dL   HCT 35.4 (L) 36.0 - 46.0 %   MCV 92.7 80.0 - 100.0 fL    MCH 31.7 26.0 - 34.0 pg   MCHC 34.2 30.0 - 36.0 g/dL   RDW 13.9 11.5 - 15.5 %   Platelets 178 150 - 400 K/uL   nRBC 0.0 0.0 - 0.2 %   Neutrophils Relative % 87 %   Neutro Abs 12.7 (H) 1.7 - 7.7 K/uL   Lymphocytes Relative 5 %   Lymphs Abs 0.7 0.7 - 4.0 K/uL   Monocytes Relative 6 %   Monocytes Absolute 0.9 0.1 - 1.0 K/uL   Eosinophils Relative 1 %   Eosinophils Absolute 0.1 0.0 - 0.5 K/uL   Basophils Relative 0 %   Basophils Absolute 0.0 0.0 - 0.1 K/uL   Immature Granulocytes 1 %   Abs Immature Granulocytes 0.13 (H) 0.00 - 0.07 K/uL    Comment: Performed at [facility], [address] [place],  [address]  Comprehensive metabolic panel     Status: Abnormal   Collection Time: 09/07/21  3:36 PM  Result Value Ref Range   Sodium 130 (L) 135 - 145 mmol/L   Potassium 3.5 3.5 - 5.1 mmol/L   Chloride 89 (L) 98 - 111 mmol/L   CO2 32 22 -  32 mmol/L   Glucose, Bld 246 (H) 70 - 99 mg/dL    Comment: Glucose reference range applies only to samples taken after fasting for at least 8 hours.   BUN 20 8 - 23 mg/dL   Creatinine, Ser 4.031.01 (H) 0.44 - 1.00 mg/dL   Calcium 8.8 (L) 8.9 - 10.3 mg/dL   Total Protein 5.7 (L) 6.5 - 8.1 g/dL   Albumin 2.8 (L) 3.5 - 5.0 g/dL   AST 20 15 - 41 U/L   ALT 17 0 - 44 U/L   Alkaline Phosphatase 104 38 - 126 U/L   Total Bilirubin 1.0 0.3 - 1.2 mg/dL   GFR, Estimated 53 (L) >60 mL/min    Comment: (NOTE) Calculated using the CKD-EPI Creatinine Equation (2021)    Anion gap 9 5 - 15    Comment: Performed at [facility], [address] [place], [place] [address]  Troponin I (High Sensitivity)     Status: Abnormal   Collection Time: 09/07/21  3:36 PM  Result Value Ref Range   Troponin I (High Sensitivity) 22 (H) <18 ng/L    Comment: (NOTE) Elevated high sensitivity troponin I (hsTnI) values and significant  changes across serial measurements may suggest ACS but many other  chronic and acute conditions are known to elevate hsTnI results.  Refer to  the "Links" section for chest pain algorithms and additional  guidance. Performed at [facility], [address] [place], [place] [address]   Brain natriuretic peptide     Status: Abnormal   Collection Time: 09/07/21  3:37 PM  Result Value Ref Range   B Natriuretic Peptide 237.0 (H) 0.0 - 100.0 pg/mL    Comment: Performed at [facility], [address] [place], [place] [address]  Lactic acid, plasma     Status: Abnormal   Collection Time: 09/07/21  4:19 PM  Result Value Ref Range   Lactic Acid, Venous 2.6 (HH) 0.5 - 1.9 mmol/L    Comment: CRITICAL RESULT CALLED TO, READ BACK BY AND VERIFIED WITH: CUGINO,M ON 09/07/21 AT 1715 BY LOY,C Performed at [facility], [address] [place], [place] [address]   Blood gas, venous (at [facility], not at [facility])     Status: Abnormal   Collection Time: 09/07/21  4:19 PM  Result Value Ref Range   FIO2 36.00    pH, Ven 7.457 (H) 7.250 - 7.430   pCO2, Ven 48.1 44.0 - 60.0 mmHg   pO2, Ven 59.1 (H) 32.0 - 45.0 mmHg   Bicarbonate 32.2 (H) 20.0 - 28.0 mmol/L   Acid-Base Excess 9.1 (H) 0.0 - 2.0 mmol/L   O2 Saturation 88.9 %   Patient temperature 38.0    Collection site RIGHT ANTECUBITAL    Drawn by [id number]    Sample type VENOUS     Comment: Performed at [facility], [address] [place], [place] [address]  Blood culture (routine x 2)     Status: None (Preliminary result)   Collection Time: 09/07/21  4:19 PM   Specimen: Right Antecubital; Blood  Result Value Ref Range   Specimen Description RIGHT ANTECUBITAL    Special Requests      BOTTLES DRAWN AEROBIC AND ANAEROBIC Blood Culture adequate volume Performed at [facility], [address] [place], [place] [address]    Culture PENDING    Report Status PENDING   Blood culture (routine x 2)     Status: None (Preliminary result)   Collection Time: 09/07/21  4:19 PM  Specimen: Left Antecubital; Blood  Result Value Ref Range   Specimen Description LEFT ANTECUBITAL    Special Requests       BOTTLES DRAWN AEROBIC AND ANAEROBIC Blood Culture adequate volume Performed at [facility], [address], [place], [place] [address]    Culture PENDING    Report Status PENDING    DG Chest 2 View  Result Date: 09/07/2021 CLINICAL DATA:  Shortness of breath. EXAM: CHEST - 2 VIEW COMPARISON:  Chest x-ray 08/27/2021. FINDINGS: Right lower lobe airspace disease has resolved. There is a new small left pleural effusion. There are new patchy opacities in left lung base. There is no evidence for pneumothorax. The heart is enlarged, unchanged. No acute fractures are seen. Orthopedic nail is identified in the proximal right humerus. IMPRESSION: 1. New small left pleural effusion with new left basilar airspace disease. 2. Interval resolution of right lower lobe airspace disease. Electronically Signed   By: Ronney Asters M.D.   On: 09/07/2021 16:09    Review of Systems  Reason unable to perform ROS: acuity of condition, pt rousable but not participating in interview, ROS obtained from granddaughter.  Constitutional:  Positive for activity change and fatigue.  Respiratory:  Positive for cough and shortness of breath.    Blood pressure 134/61, pulse 68, temperature (!) 100.4 F (38 C), temperature source Rectal, resp. rate 20, SpO2 95 %. Physical Exam Constitutional:      General: She is not in acute distress.    Appearance: She is ill-appearing.  HENT:     Head: Normocephalic and atraumatic.  Eyes:     Extraocular Movements: Extraocular movements intact.  Cardiovascular:     Rate and Rhythm: Regular rhythm. Tachycardia present.  Pulmonary:     Effort: Pulmonary effort is normal.     Breath sounds: Wheezing present.  Abdominal:     General: Bowel sounds are normal.     Palpations: Abdomen is soft.  Musculoskeletal:     Cervical back: Neck supple.     Right lower leg: No edema.     Left lower leg: No edema.  Skin:    General: Skin is warm and dry.  Neurological:     General: No focal  deficit present.     Mental Status: She is disoriented.  Psychiatric:        Behavior: Behavior is not agitated.       Emeterio Reeve, DO 09/07/2021, 5:22 PM

## 2021-09-07 NOTE — ED Notes (Signed)
Both sets of cultures were drawn before antibiotic administration

## 2021-09-07 NOTE — ED Notes (Signed)
Pt transported to Xray. 

## 2021-09-07 NOTE — ED Triage Notes (Signed)
Pt arrived via [facility] w c/o SOB. Pt O2 was 88 on Room air per EMS. Pts o2 is now 93 on 3 liters. Pt is Covid positive

## 2021-09-07 NOTE — ED Provider Notes (Signed)
[facility] Provider Note   CSN: [id number] Arrival date & time: 09/07/21  1422     History  Chief Complaint  Patient presents with   Shortness of Breath    Carrie Best is a 88 y.o. female presenting for evaluation of shortness of breath, increased tiredness, hypoxia.  Level 5 caveat due to dementia.  History provided by SNF staff member.  State this morning patient was hard to wake up, did not eat much of her breakfast.  She had an episode where she became cold/shaky and clammy.  During physical therapy today, she was asking for people who were not in the room.  During this time, her SPO2 dropped to 77% on her baseline 3 L and she had labored breathing.  As such, she was sent to the ER.  Additionally, staff states she has been very thirsty, requesting to drink lots of water which is not her normal.  She was recently admitted, staff states she has been doing well until today.     HPI     Home Medications Prior to Admission medications   Medication Sig Start Date End Date Taking? Authorizing Provider  apixaban (ELIQUIS) 5 MG TABS tablet Take 1 tablet (5 mg total) by mouth 2 (two) times daily. 07/27/20   Roxan Hockey, MD  ascorbic acid (VITAMIN C) 500 MG tablet Take 1 tablet (500 mg total) by mouth daily. 09/02/21   Barton Dubois, MD  atorvastatin (LIPITOR) 40 MG tablet Take 1 tablet (40 mg total) by mouth daily. 06/26/19   Kayleen Memos, DO  diltiazem (CARDIZEM CD) 180 MG 24 hr capsule Take 180 mg by mouth daily.    [provider]  DULoxetine (CYMBALTA) 60 MG capsule Take 60 mg by mouth daily.     [provider]  guaiFENesin-dextromethorphan (ROBITUSSIN DM) 100-10 MG/5ML syrup Take 10 mLs by mouth every 4 (four) hours as needed for cough. 09/01/21   Barton Dubois, MD  hydrALAZINE (APRESOLINE) 10 MG tablet Take 1 tablet (10 mg total) by mouth 2 (two) times daily. 07/27/20 08/27/21  Roxan Hockey, MD  insulin aspart (NOVOLOG)  100 UNIT/ML injection Inject 0-5 Units into the skin at bedtime. CBG 70-200 provide 0 units CBG 201-250 provide 2 units CBG 251-300 provide 3 units CBG 301-350 provide 4 units CBG 351-400 provide 5 units CBG over 400 please contact medical provider. 09/01/21   Barton Dubois, MD  insulin aspart (NOVOLOG) 100 UNIT/ML injection Inject 0-6 Units into the skin 3 (three) times daily with meals. CBG is 70-120 provide 0 units CBG 121-150 provide 1 unit CBG 151-250 provide 2 units CBG 251-300 provide 3 units CBG 301-350 provide 4 units CBG more than 400 provide 6 units and call medical provider. 09/01/21   Barton Dubois, MD  insulin detemir (LEVEMIR) 100 UNIT/ML injection Inject 0.07 mLs (7 Units total) into the skin daily. 09/01/21   Barton Dubois, MD  Ipratropium-Albuterol (COMBIVENT) 20-100 MCG/ACT AERS respimat Inhale 1 puff into the lungs 2 (two) times daily. 09/01/21   Barton Dubois, MD  isosorbide mononitrate (IMDUR) 30 MG 24 hr tablet TAKE 1 TABLET(30 MG) BY MOUTH DAILY 10/15/19   Nahser, Wonda Cheng, MD  LORazepam (ATIVAN) 0.5 MG tablet Take 1 tablet (0.5 mg total) by mouth 3 (three) times daily as needed. 09/01/21   Barton Dubois, MD  metoprolol tartrate (LOPRESSOR) 50 MG tablet Take 50 mg by mouth 2 (two) times daily.    [provider]  polyethylene glycol (MIRALAX / GLYCOLAX)  17 g packet Take 17 g by mouth daily. 06/26/19   Darlin Drop, DO  Potassium Chloride ER 20 MEQ TBCR Take 1 tablet by mouth daily. 07/27/20   Shon Hale, MD  predniSONE (DELTASONE) 20 MG tablet Take 2 tablets (40 mg total) by mouth daily. Take 2 tablets by mouth daily x1 day; then 1 tablet by mouth daily x3 days; then half tablet by mouth daily x3 days and stop prednisone. 09/02/21   Vassie Loll, MD  torsemide (DEMADEX) 20 MG tablet Take 1 tablet (20 mg total) by mouth daily. 09/01/21   Vassie Loll, MD  traMADol (ULTRAM) 50 MG tablet Take 1 tablet (50 mg total) by mouth every 8 (eight) hours as  needed for severe pain. 09/01/21   Vassie Loll, MD  zinc sulfate 220 (50 Zn) MG capsule Take 1 capsule (220 mg total) by mouth daily. 09/02/21   Vassie Loll, MD      Allergies    Codeine, Elemental sulfur, and Penicillins    Review of Systems   Review of Systems  Unable to perform ROS: Dementia  Respiratory:  Positive for shortness of breath.   Psychiatric/Behavioral:  Positive for confusion.    Physical Exam Updated Vital Signs BP 104/62    Pulse 63    Temp (!) 100.4 F (38 C) (Rectal)    Resp (!) 22    SpO2 95%  Physical Exam Vitals and nursing note reviewed.  Constitutional:      General: She is not in acute distress.    Appearance: Normal appearance. She is ill-appearing (appears chornically ill).     Comments: Tired, difficult to wake up  HENT:     Head: Normocephalic and atraumatic.  Eyes:     Conjunctiva/sclera: Conjunctivae normal.     Pupils: Pupils are equal, round, and reactive to light.  Cardiovascular:     Rate and Rhythm: Normal rate. Rhythm irregular.     Pulses: Normal pulses.     Comments: Irregularly irregular Pulmonary:     Effort: Pulmonary effort is normal. No respiratory distress.     Breath sounds: Normal breath sounds. No wheezing.     Comments: Diminished lung sounds throughout, likely 2/2 poor effort. SpO2 mid 90's on baseline O2. Abdominal:     General: There is no distension.     Palpations: Abdomen is soft. There is no mass.     Tenderness: There is no abdominal tenderness. There is no guarding or rebound.  Musculoskeletal:        General: Normal range of motion.     Cervical back: Normal range of motion and neck supple.     Right lower leg: Edema present.     Left lower leg: Edema present.     Comments: Mild bilateral pitting edema  Skin:    General: Skin is warm and dry.     Capillary Refill: Capillary refill takes less than 2 seconds.  Psychiatric:        Mood and Affect: Mood and affect normal.        Speech: Speech normal.         Behavior: Behavior normal.    ED Results / Procedures / Treatments   Labs (all labs ordered are listed, but only abnormal results are displayed) Labs Reviewed  CBC WITH DIFFERENTIAL/PLATELET - Abnormal; Notable for the following components:      Result Value   WBC 14.6 (*)    RBC 3.82 (*)    HCT 35.4 (*)  Neutro Abs 12.7 (*)    Abs Immature Granulocytes 0.13 (*)    All other components within normal limits  COMPREHENSIVE METABOLIC PANEL - Abnormal; Notable for the following components:   Sodium 130 (*)    Chloride 89 (*)    Glucose, Bld 246 (*)    Creatinine, Ser 1.01 (*)    Calcium 8.8 (*)    Total Protein 5.7 (*)    Albumin 2.8 (*)    GFR, Estimated 53 (*)    All other components within normal limits  LACTIC ACID, PLASMA - Abnormal; Notable for the following components:   Lactic Acid, Venous 2.6 (*)    All other components within normal limits  BRAIN NATRIURETIC PEPTIDE - Abnormal; Notable for the following components:   B Natriuretic Peptide 237.0 (*)    All other components within normal limits  BLOOD GAS, VENOUS - Abnormal; Notable for the following components:   pH, Ven 7.457 (*)    pO2, Ven 59.1 (*)    Bicarbonate 32.2 (*)    Acid-Base Excess 9.1 (*)    All other components within normal limits  TROPONIN I (HIGH SENSITIVITY) - Abnormal; Notable for the following components:   Troponin I (High Sensitivity) 22 (*)    All other components within normal limits  CULTURE, BLOOD (ROUTINE X 2)  CULTURE, BLOOD (ROUTINE X 2)  LACTIC ACID, PLASMA  TROPONIN I (HIGH SENSITIVITY)    EKG EKG Interpretation  Date/Time:  Monday September 07 2021 14:34:18 EST Ventricular Rate:  65 PR Interval:    QRS Duration: 138 QT Interval:  442 QTC Calculation: 460 R Axis:   -59 Text Interpretation: Atrial fibrillation Ventricular premature complex Left bundle branch block Confirmed by Campbell Stall ([id number]) on [id number] 4:36:31 PM  Radiology DG Chest 2 View  Result Date:  09/07/2021 CLINICAL DATA:  Shortness of breath. EXAM: CHEST - 2 VIEW COMPARISON:  Chest x-ray 08/27/2021. FINDINGS: Right lower lobe airspace disease has resolved. There is a new small left pleural effusion. There are new patchy opacities in left lung base. There is no evidence for pneumothorax. The heart is enlarged, unchanged. No acute fractures are seen. Orthopedic nail is identified in the proximal right humerus. IMPRESSION: 1. New small left pleural effusion with new left basilar airspace disease. 2. Interval resolution of right lower lobe airspace disease. Electronically Signed   By: Ronney Asters M.D.   On: 09/07/2021 16:09    Procedures Procedures    Medications Ordered in ED Medications  vancomycin (VANCOREADY) IVPB 1500 mg/300 mL (1,500 mg Intravenous New Bag/Given 09/07/21 1727)  vancomycin (VANCOREADY) IVPB 1250 mg/250 mL (has no administration in time range)  ceFEPIme (MAXIPIME) 1 g in sodium chloride 0.9 % 100 mL IVPB (0 g Intravenous Stopped 09/07/21 1725)  acetaminophen (TYLENOL) tablet 650 mg (650 mg Oral Given 09/07/21 1648)    ED Course/ Medical Decision Making/ A&P                           Medical Decision Making   This patient presents to the ED for concern of shortness of breath and confusion. This involves an extensive number of treatment options, and is a complaint that carries with it a high risk of complications and morbidity.  The differential diagnosis includes pneumonia, viral illness, electrolyte abnormality, anemia, stroke, delirium, psychosis, chf exacerbation, ACS.    Co morbidities:  Recent covid infection, HTN, CKD, afib on anticoagulation, CHF, CVA   Additional history: Reviewed  recent hospitalization notes.  Obtained history from [facility] staff members. Additionally, discussed with patient's granddaughter who states patient is more tired than normal  Lab Tests:  I ordered, and personally interpreted labs.  The pertinent results include: Leukocytosis  of 14.6.  Lactic minimally elevated at 2.6.  BNP is not significantly elevated, electrolytes are stable.  Troponin minimally elevated, however similar to recent hospitalization.  As such, this is most consistent with infection, likely superimposed bacterial infection after recent COVID illness.   Imaging Studies:  I ordered imaging studies including chest x-ray I independently visualized and interpreted imaging which showed left lower lobe opacity and pleural effusion, most consistent with pneumonia in the setting of fever, hypoxia, shortness of breath. I agree with the radiologist interpretation   Cardiac Monitoring:  The patient was maintained on a cardiac monitor.  I personally viewed and interpreted the cardiac monitored which showed an underlying rhythm of: afib   Medicines ordered:  I ordered medication including abx and tylenol for tx of infection Reevaluation of the patient after these medicines showed that the patient stayed the same   Test Considered:  Considered CTA for PE, however patient is anticoagulated, as such, less likely.    Dispostion:  After consideration of the diagnostic results and the patients response to treatment, I feel that the patent would benefit from admission for further management of pneumonia and pleural effusion.  Discussed with Dr. Sheppard Coil from [facility] hospitalist service, patient to be admitted.  Final Clinical Impression(s) / ED Diagnoses Final diagnoses:  Hospital acquired PNA  Pleural effusion    Rx / DC Orders ED Discharge Orders     None         Franchot Heidelberg, PA-C 09/07/21 1759    Truddie Hidden, MD 09/07/21 2148

## 2021-09-07 NOTE — Progress Notes (Signed)
Pharmacy Antibiotic Note  Carrie Best is a 88 y.o. female admitted on 09/07/2021 with  HCAP .  Pharmacy has been consulted for Cefepime dosing this evening. Received Cefepime 1gm IV~4:30pm.  Vancomycin dosed earlier today.     MD has also added Levofloxacin IV.   Azithromycin 500 mg PO given at 1837.    Recent admission 12/22>>09/01/21 with COVID and received Remdesivir x 5 days.    Plan: Cefepime 2gm IV q12h. Adjusted Levofloxacin 750 mg IV q24h to q48h for current renal function.  Begin 1/3 since Azithromycin given ~6:30pm. Vancomycin 1500 mg loading dose given ~5:30pm. Vancomycin 1250 mg IV q36h to begin 1/4 at 6am. Goal AUC 400-550. Expected AUC: 439; Scr used 1.01 Will follow renal function, culture data, clinical progress and antibiotic plans.    Temp (24hrs), Avg:99.3 F (37.4 C), Min:98.4 F (36.9 C), Max:100.4 F (38 C)  Recent Labs  Lab 09/01/21 0557 09/07/21 1536 09/07/21 1619 09/07/21 1801  WBC 11.3* 14.6*  --   --   CREATININE 1.16* 1.01*  --   --   LATICACIDVEN  --   --  2.6* 2.2*    Estimated Creatinine Clearance: 36.6 mL/min (A) (by C-G formula based on SCr of 1.01 mg/dL (H)).    Allergies  Allergen Reactions   Codeine Nausea And Vomiting   Elemental Sulfur Nausea And Vomiting   Penicillins Hives    Has patient had a PCN reaction causing immediate rash, facial/tongue/throat swelling, SOB or lightheadedness with hypotension: No Has patient had a PCN reaction causing severe rash involving mucus membranes or skin necrosis: No Has patient had a PCN reaction that required hospitalization No Has patient had a PCN reaction occurring within the last 10 years: No If all of the above answers are "NO", then may proceed with Cephalosporin use.     Antimicrobials this admission:  Vancomycin 1/2 >>  Cefepime 1/2>>  Azithromycin 500 mg PO x 1 on 1/2  Levaquin IV 1/3 >>  Dose adjustments this admission: 1/2: Levaquin 750 mg IV q24h adjusted to q48h for renal  function  Microbiology results:  1/2 COVID: positive    - hx recent COVID + 08/27/21  1/2 influenza: negative  1/2 blood x 2:  1/2 MRSA PCR:   Thank you for allowing pharmacy to be a part of this patients care.  Dennie Fetters, [place] 09/07/2021 9:01 PM

## 2021-09-08 LAB — BASIC METABOLIC PANEL
Anion gap: 8 (ref 5–15)
BUN: 22 mg/dL (ref 8–23)
CO2: 33 mmol/L — ABNORMAL HIGH (ref 22–32)
Calcium: 8.8 mg/dL — ABNORMAL LOW (ref 8.9–10.3)
Chloride: 91 mmol/L — ABNORMAL LOW (ref 98–111)
Creatinine, Ser: 1.08 mg/dL — ABNORMAL HIGH (ref 0.44–1.00)
GFR, Estimated: 48 mL/min — ABNORMAL LOW (ref >60)
Glucose, Bld: 139 mg/dL — ABNORMAL HIGH (ref 70–99)
Potassium: 3.1 mmol/L — ABNORMAL LOW (ref 3.5–5.1)
Sodium: 132 mmol/L — ABNORMAL LOW (ref 135–145)

## 2021-09-08 LAB — CBC
HCT: 31.5 % — ABNORMAL LOW (ref 36.0–46.0)
Hemoglobin: 10.7 g/dL — ABNORMAL LOW (ref 12.0–15.0)
MCH: 31.8 pg (ref 26.0–34.0)
MCHC: 34 g/dL (ref 30.0–36.0)
MCV: 93.5 fL (ref 80.0–100.0)
Platelets: 150 10*3/uL (ref 150–400)
RBC: 3.37 MIL/uL — ABNORMAL LOW (ref 3.87–5.11)
RDW: 13.9 % (ref 11.5–15.5)
WBC: 11.1 10*3/uL — ABNORMAL HIGH (ref 4.0–10.5)
nRBC: 0 % (ref 0.0–0.2)

## 2021-09-08 LAB — GLUCOSE, CAPILLARY
Glucose-Capillary: 131 mg/dL — ABNORMAL HIGH (ref 70–99)
Glucose-Capillary: 237 mg/dL — ABNORMAL HIGH (ref 70–99)
Glucose-Capillary: 78 mg/dL (ref 70–99)
Glucose-Capillary: 148 mg/dL — ABNORMAL HIGH (ref 70–99)

## 2021-09-08 LAB — HIV ANTIBODY (ROUTINE TESTING W REFLEX): HIV Screen 4th Generation wRfx: NONREACTIVE

## 2021-09-08 LAB — MRSA NEXT GEN BY PCR, NASAL: MRSA by PCR Next Gen: NOT DETECTED

## 2021-09-08 MED ORDER — SODIUM CHLORIDE 0.9 % IV SOLN
500.0000 mg | INTRAVENOUS | Status: DC
  Administered 2021-09-08 – 2021-09-10 (×3): 500 mg via INTRAVENOUS
  Filled 2021-09-08 (×3): qty 5

## 2021-09-08 MED ORDER — VANCOMYCIN HCL 750 MG/150ML IV SOLN: 750.0000 mg | INTRAVENOUS | Status: DC

## 2021-09-08 MED ORDER — POTASSIUM CHLORIDE CRYS ER 20 MEQ PO TBCR
40.0000 meq | EXTENDED_RELEASE_TABLET | ORAL | Status: AC
  Administered 2021-09-08 (×2): 40 meq via ORAL
  Filled 2021-09-08: qty 2

## 2021-09-08 MED ORDER — POTASSIUM CHLORIDE 20 MEQ PO PACK
40.0000 meq | PACK | Freq: Once | ORAL | Status: AC
  Administered 2021-09-08: 40 meq via ORAL
  Filled 2021-09-08: qty 2

## 2021-09-08 NOTE — Progress Notes (Signed)
PROGRESS NOTE     Carrie Best, is a 88 y.o. female, DOB - 05-26-1928, ZYY:[id number]  Admit date - 09/07/2021   Admitting Physician Carrie Reeve, DO  Outpatient Primary MD for the patient is Carrie Orn, MD  LOS - 1  Chief Complaint  Patient presents with   Shortness of Breath        Brief Narrative:  88 y.o. female.   has a past medical history of Anxiety, Arthritis, Congestive heart failure ([place]), Depression, Diastolic dysfunction, Dyspnea, Dysrhythmia (05-10-12), GERD (gastroesophageal reflux disease) (05-10-12), Hearing loss (05-10-12), Heart murmur, History of kidney stones, Hyperlipidemia, Hypertension, Pneumonia (05-10-12), Stroke ([place]) (05-10-12), and Wears hearing aid was admitted on 09/07/2021  with acute hypoxic respiratory failure in the setting of recent COVID-19 infection  Assessment & Plan:   Principal Problem:   Acute hypoxemic respiratory failure ([place]) Active Problems:   Hospital acquired PNA   1) sepsis secondary to bilateral pneumonia--patient with recent COVID-19 infection -Met sepsis criteria on admission with leukocytosis, fevers, tachycardia, tachypnea and finding of pneumonia --Admission chest x-ray with bibasilar pneumonia and small left pleural effusion WBC 14.6 >> 11.1 -Lactic acid was greater than 2 -MRSA PCR negative okay to stop vancomycin -STOP Levaquin -Continue IV cefepime -Add azithromycin  2) acute hypoxic respiratory failure-- in the setting of recent COVID-19 infection (Dxed 08/27/21) =--- Secondary to 1 above -Continue to attempt to wean off oxygen currently requiring 2 L of oxygen via nasal cannula  3)PAFIB--continue apixaban for stroke prophylaxis, Cardizem and metoprolol for rate control  4)Covid 19--hx recent COVID + 08/27/21--- symptomatic and supportive care  -patient is vaccinated but not boosted - 12/22>>09/01/21 received Remdesivir x 5 days.  5)HTN/HFpEF--- patient on chronic diastolic CHF, no evidence of acute CHF  exacerbation at this time -Continue metoprolol, as well as isosorbide -Stop torsemide due to sepsis and concerns for dehydration  6)CKD 3B--  renally adjust medications, avoid nephrotoxic agents / dehydration  / hypotension  7)Social/Ethics--- patient is a DNR/DNI, readmitted from SNF  8)DEmentia/Depression/Anxiety--patient is not agitated, continue Cymbalta, use lorazepam as needed  9)DM2-A1c 6.1 reflecting excellent diabetic control PTA, Use Novolog/Humalog Sliding scale insulin with Accu-Cheks/Fingersticks as ordered   10) hyponatremia/hypokalemia--- stop torsemide, replace potassium  Disposition/Need for in-Hospital Stay- patient unable to be discharged at this time due to sepsis secondary to pneumonia requiring IV fluids and IV antibiotics -Possible discharge back to SNF in 1 to 2 days if improves  Status is: Inpatient  Remains inpatient appropriate because: As above  Disposition: The patient is from: SNF              Anticipated d/c is to: SNF              Anticipated d/c date is: 2 days              Patient currently is not medically stable to d/c. Barriers: Not Clinically Stable-   Code Status :  -  Code Status: DNR   Family Communication:  POA is patient's daughter, Carrie Best who can be reached at [phone]  Consults  :  Na  DVT Prophylaxis  :   - SCDs  apixaban (ELIQUIS) tablet 5 mg    Lab Results  Component Value Date   PLT 150 09/08/2021    Inpatient Medications  Scheduled Meds:  apixaban  5 mg Oral BID   ascorbic acid  500 mg Oral Daily   atorvastatin  40 mg Oral Daily   diltiazem  180 mg Oral Daily  DULoxetine  60 mg Oral Daily   insulin aspart  0-15 Units Subcutaneous TID WC   insulin aspart  0-5 Units Subcutaneous QHS   insulin aspart  3 Units Subcutaneous TID WC   insulin detemir  7 Units Subcutaneous Daily   isosorbide mononitrate  30 mg Oral Daily   metoprolol tartrate  50 mg Oral BID   polyethylene glycol  17 g Oral Daily   potassium  chloride SA  20 mEq Oral Daily   torsemide  20 mg Oral Daily   zinc sulfate  220 mg Oral Daily   Continuous Infusions:  ceFEPime (MAXIPIME) IV 2 g (09/08/21 1248)   PRN Meds:.guaiFENesin-dextromethorphan, LORazepam, traMADol   Anti-infectives (From admission, onward)    Start     Dose/Rate Route Frequency Ordered Stop   09/09/21 0600  vancomycin (VANCOREADY) IVPB 1250 mg/250 mL  Status:  Discontinued        1,250 mg 166.7 mL/hr over 90 Minutes Intravenous Every 36 hours 09/07/21 1629 09/08/21 0842   09/08/21 1800  levofloxacin (LEVAQUIN) IVPB 750 mg  Status:  Discontinued        750 mg 100 mL/hr over 90 Minutes Intravenous Every 48 hours 09/07/21 2013 09/08/21 1008   09/08/21 1800  vancomycin (VANCOREADY) IVPB 750 mg/150 mL  Status:  Discontinued        750 mg 150 mL/hr over 60 Minutes Intravenous Every 24 hours 09/08/21 0842 09/08/21 1518   09/08/21 0200  ceFEPIme (MAXIPIME) 2 g in sodium chloride 0.9 % 100 mL IVPB        2 g 200 mL/hr over 30 Minutes Intravenous Every 12 hours 09/07/21 2058     09/07/21 2200  cefTRIAXone (ROCEPHIN) 2 g in sodium chloride 0.9 % 100 mL IVPB  Status:  Discontinued        2 g 200 mL/hr over 30 Minutes Intravenous Every 24 hours 09/07/21 1816 09/07/21 2012   09/07/21 1830  azithromycin (ZITHROMAX) tablet 500 mg  Status:  Discontinued        500 mg Oral Daily 09/07/21 1816 09/07/21 2012   09/07/21 1700  ceFEPIme (MAXIPIME) 1 g in sodium chloride 0.9 % 100 mL IVPB        1 g 200 mL/hr over 30 Minutes Intravenous  Once 09/07/21 1616 09/07/21 1725   09/07/21 1700  vancomycin (VANCOREADY) IVPB 1500 mg/300 mL        1,500 mg 150 mL/hr over 120 Minutes Intravenous  Once 09/07/21 1625 09/07/21 1934         Subjective: Carrie Best today has no fevers, no emesis,  No chest pain,   --Cough and hypoxia persist - Her intake is fair   Objective: Vitals:   09/08/21 0227 09/08/21 0526 09/08/21 0949 09/08/21 1625  BP: 137/67 126/64 124/64 106/83   Pulse: (!) 52 (!) 103 62 (!) 56  Resp: 18 16 18 20   Temp: 98.3 F (36.8 C) 98.5 F (36.9 C)  97.7 F (36.5 C)  TempSrc:  Oral  Oral  SpO2: 100% (!) 84% 100% 100%  Weight:      Height:        Intake/Output Summary (Last 24 hours) at 09/08/2021 1939 Last data filed at 09/08/2021 1500 Gross per 24 hour  Intake 569.91 ml  Output 1550 ml  Net -980.09 ml   Filed Weights   09/07/21 2200  Weight: 70.4 kg     Physical Exam  Gen:- Awake Alert, chronically ill-appearing HEENT:- [place].AT, No sclera icterus Neck-Supple Neck,No JVD,.  Lungs-diminished breath sounds with scattered rhonchi bilaterally, CV- S1, S2 normal, regular  Abd-  +ve B.Sounds, Abd Soft, No tenderness,    Extremity/Skin:- No  edema, pedal pulses present  Psych-underlying/baseline cognitive and memory deficits, somewhat disoriented Neuro-generalized weakness, no new focal deficits, no tremors  Data Reviewed: I have personally reviewed following labs and imaging studies  CBC: Recent Labs  Lab 09/07/21 1536 09/08/21 0544  WBC 14.6* 11.1*  NEUTROABS 12.7*  --   HGB 12.1 10.7*  HCT 35.4* 31.5*  MCV 92.7 93.5  PLT 178 462   Basic Metabolic Panel: Recent Labs  Lab 09/07/21 1536 09/08/21 0544  NA 130* 132*  K 3.5 3.1*  CL 89* 91*  CO2 32 33*  GLUCOSE 246* 139*  BUN 20 22  CREATININE 1.01* 1.08*  CALCIUM 8.8* 8.8*   GFR: Estimated Creatinine Clearance: 34.2 mL/min (A) (by C-G formula based on SCr of 1.08 mg/dL (H)). Liver Function Tests: Recent Labs  Lab 09/07/21 1536  AST 20  ALT 17  ALKPHOS 104  BILITOT 1.0  PROT 5.7*  ALBUMIN 2.8*   No results for input(s): LIPASE, AMYLASE in the last 168 hours. No results for input(s): AMMONIA in the last 168 hours. Coagulation Profile: No results for input(s): INR, PROTIME in the last 168 hours. Cardiac Enzymes: No results for input(s): CKTOTAL, CKMB, CKMBINDEX, TROPONINI in the last 168 hours. BNP (last 3 results) No results for input(s): PROBNP in  the last 8760 hours. HbA1C: No results for input(s): HGBA1C in the last 72 hours. CBG: Recent Labs  Lab 09/07/21 2304 09/08/21 0817 09/08/21 1125 09/08/21 1626  GLUCAP 152* 131* 237* 78   Lipid Profile: No results for input(s): CHOL, HDL, LDLCALC, TRIG, CHOLHDL, LDLDIRECT in the last 72 hours. Thyroid Function Tests: No results for input(s): TSH, T4TOTAL, FREET4, T3FREE, THYROIDAB in the last 72 hours. Anemia Panel: No results for input(s): VITAMINB12, FOLATE, FERRITIN, TIBC, IRON, RETICCTPCT in the last 72 hours. Urine analysis:    Component Value Date/Time   COLORURINE YELLOW 08/27/2021 1412   APPEARANCEUR CLEAR 08/27/2021 1412   LABSPEC 1.015 08/27/2021 1412   PHURINE 6.5 08/27/2021 1412   GLUCOSEU NEGATIVE 08/27/2021 1412   HGBUR NEGATIVE 08/27/2021 1412   [place] 08/27/2021 1412   KETONESUR NEGATIVE 08/27/2021 1412   PROTEINUR NEGATIVE 08/27/2021 1412   UROBILINOGEN 0.2 02/07/2015 1534   NITRITE NEGATIVE 08/27/2021 1412   LEUKOCYTESUR NEGATIVE 08/27/2021 1412   Sepsis Labs: @LABRCNTIP (procalcitonin:4,lacticidven:4)  ) Recent Results (from the past 240 hour(s))  Blood culture (routine x 2)     Status: None (Preliminary result)   Collection Time: 09/07/21  4:19 PM   Specimen: Right Antecubital; Blood  Result Value Ref Range Status   Specimen Description RIGHT ANTECUBITAL  Final   Special Requests   Final    BOTTLES DRAWN AEROBIC AND ANAEROBIC Blood Culture adequate volume   Culture   Final    NO GROWTH < 24 HOURS Performed at [facility], [address], [place], [place] [address]    Report Status PENDING  Incomplete  Blood culture (routine x 2)     Status: None (Preliminary result)   Collection Time: 09/07/21  4:19 PM   Specimen: Left Antecubital; Blood  Result Value Ref Range Status   Specimen Description LEFT ANTECUBITAL  Final   Special Requests   Final    BOTTLES DRAWN AEROBIC AND ANAEROBIC Blood Culture adequate volume   Culture   Final     NO GROWTH < 24 HOURS Performed  at [facility], [address], [place], [place] [address]    Report Status PENDING  Incomplete  Resp Panel by RT-PCR (Flu A&B, Covid) Nasopharyngeal Swab     Status: Abnormal   Collection Time: 09/07/21  6:27 PM   Specimen: Nasopharyngeal Swab; Nasopharyngeal(NP) swabs in vial transport medium  Result Value Ref Range Status   SARS Coronavirus 2 by RT PCR POSITIVE (A) NEGATIVE Final    Comment: (NOTE) SARS-CoV-2 target nucleic acids are DETECTED.  The SARS-CoV-2 RNA is generally detectable in upper respiratory specimens during the acute phase of infection. Positive results are indicative of the presence of the identified virus, but do not rule out bacterial infection or co-infection with other pathogens not detected by the test. Clinical correlation with patient history and other diagnostic information is necessary to determine patient infection status. The expected result is Negative.  Fact Sheet for Patients: EntrepreneurPulse.com.au  Fact Sheet for Healthcare Providers: IncredibleEmployment.be  This test is not yet approved or cleared by the [place] FDA and  has been authorized for detection and/or diagnosis of SARS-CoV-2 by FDA under an Emergency Use Authorization (EUA).  This EUA will remain in effect (meaning this test can be used) for the duration of  the COVID-19 declaration under Section 564(b)(1) of the A ct, 21 U.S.C. section 360bbb-3(b)(1), unless the authorization is terminated or revoked sooner.     Influenza A by PCR NEGATIVE NEGATIVE Final   Influenza B by PCR NEGATIVE NEGATIVE Final    Comment: (NOTE) The Xpert Xpress SARS-CoV-2/FLU/RSV plus assay is intended as an aid in the diagnosis of influenza from Nasopharyngeal swab specimens and should not be used as a sole basis for treatment. Nasal washings and aspirates are unacceptable for Xpert Xpress SARS-CoV-2/FLU/RSV testing.  Fact  Sheet for Patients: EntrepreneurPulse.com.au  Fact Sheet for Healthcare Providers: IncredibleEmployment.be  This test is not yet approved or cleared by the [place] FDA and has been authorized for detection and/or diagnosis of SARS-CoV-2 by FDA under an Emergency Use Authorization (EUA). This EUA will remain in effect (meaning this test can be used) for the duration of the COVID-19 declaration under Section 564(b)(1) of the Act, 21 U.S.C. section 360bbb-3(b)(1), unless the authorization is terminated or revoked.  Performed at [facility], [address] [place], [place] [address]   MRSA Next Gen by PCR, Nasal     Status: None   Collection Time: 09/08/21 12:24 PM   Specimen: Nasal Mucosa; Nasal Swab  Result Value Ref Range Status   MRSA by PCR Next Gen NOT DETECTED NOT DETECTED Final    Comment: (NOTE) The GeneXpert MRSA Assay (FDA approved for NASAL specimens only), is one component of a comprehensive MRSA colonization surveillance program. It is not intended to diagnose MRSA infection nor to guide or monitor treatment for MRSA infections. Test performance is not FDA approved in patients less than 72 years old. Performed at [facility], [address] [place],  [address]       Radiology Studies: DG Chest 2 View  Result Date: 09/07/2021 CLINICAL DATA:  Shortness of breath. EXAM: CHEST - 2 VIEW COMPARISON:  Chest x-ray 08/27/2021. FINDINGS: Right lower lobe airspace disease has resolved. There is a new small left pleural effusion. There are new patchy opacities in left lung base. There is no evidence for pneumothorax. The heart is enlarged, unchanged. No acute fractures are seen. Orthopedic nail is identified in the proximal right humerus. IMPRESSION: 1. New small left pleural effusion with new left basilar  airspace disease. 2. Interval resolution of right lower lobe airspace disease. Electronically Signed   By: Ronney Asters M.D.   On:  09/07/2021 16:09     Scheduled Meds:  apixaban  5 mg Oral BID   ascorbic acid  500 mg Oral Daily   atorvastatin  40 mg Oral Daily   diltiazem  180 mg Oral Daily   DULoxetine  60 mg Oral Daily   insulin aspart  0-15 Units Subcutaneous TID WC   insulin aspart  0-5 Units Subcutaneous QHS   insulin aspart  3 Units Subcutaneous TID WC   insulin detemir  7 Units Subcutaneous Daily   isosorbide mononitrate  30 mg Oral Daily   metoprolol tartrate  50 mg Oral BID   polyethylene glycol  17 g Oral Daily   potassium chloride SA  20 mEq Oral Daily   torsemide  20 mg Oral Daily   zinc sulfate  220 mg Oral Daily   Continuous Infusions:  ceFEPime (MAXIPIME) IV 2 g (09/08/21 1248)     LOS: 1 day    Roxan Hockey M.D on 09/08/2021 at 7:39 PM  Go to [email or web address] - for contact info  [facility] - Office  [phone]  If 7PM-7AM, please contact night-coverage [email or web address] Password [facility] 09/08/2021, 7:39 PM

## 2021-09-09 LAB — RENAL FUNCTION PANEL
Albumin: 2.6 g/dL — ABNORMAL LOW (ref 3.5–5.0)
Anion gap: 9 (ref 5–15)
BUN: 25 mg/dL — ABNORMAL HIGH (ref 8–23)
CO2: 24 mmol/L (ref 22–32)
Calcium: 8.5 mg/dL — ABNORMAL LOW (ref 8.9–10.3)
Chloride: 98 mmol/L (ref 98–111)
Creatinine, Ser: 1.04 mg/dL — ABNORMAL HIGH (ref 0.44–1.00)
GFR, Estimated: 51 mL/min — ABNORMAL LOW (ref >60)
Glucose, Bld: 113 mg/dL — ABNORMAL HIGH (ref 70–99)
Phosphorus: 2.6 mg/dL (ref 2.5–4.6)
Potassium: 4.7 mmol/L (ref 3.5–5.1)
Sodium: 131 mmol/L — ABNORMAL LOW (ref 135–145)

## 2021-09-09 LAB — GLUCOSE, CAPILLARY
Glucose-Capillary: 114 mg/dL — ABNORMAL HIGH (ref 70–99)
Glucose-Capillary: 171 mg/dL — ABNORMAL HIGH (ref 70–99)
Glucose-Capillary: 120 mg/dL — ABNORMAL HIGH (ref 70–99)
Glucose-Capillary: 149 mg/dL — ABNORMAL HIGH (ref 70–99)

## 2021-09-09 LAB — CBC
HCT: 33.4 % — ABNORMAL LOW (ref 36.0–46.0)
Hemoglobin: 11.3 g/dL — ABNORMAL LOW (ref 12.0–15.0)
MCH: 31.5 pg (ref 26.0–34.0)
MCHC: 33.8 g/dL (ref 30.0–36.0)
MCV: 93 fL (ref 80.0–100.0)
Platelets: 164 10*3/uL (ref 150–400)
RBC: 3.59 MIL/uL — ABNORMAL LOW (ref 3.87–5.11)
RDW: 14.3 % (ref 11.5–15.5)
WBC: 11.7 10*3/uL — ABNORMAL HIGH (ref 4.0–10.5)
nRBC: 0 % (ref 0.0–0.2)

## 2021-09-09 LAB — MAGNESIUM: Magnesium: 2.1 mg/dL (ref 1.7–2.4)

## 2021-09-09 NOTE — NC FL2 (Signed)
[place] LEVEL OF CARE SCREENING TOOL     IDENTIFICATION  Patient Name: Carrie Best Birthdate: [birth year] Sex: female Admission Date (Current Location): 09/07/2021  [facility] and [place] Number:  [employer] and Address:  [place] [address] [place]      Provider Number: [id number]  Attending Physician Name and Address:  Barton Dubois, MD  Relative Name and Phone Number:  Shayda Curtsinger  [phone]    Current Level of Care: Hospital Recommended Level of Care: [place] Prior Approval Number:    Date Approved/Denied:   PASRR Number:    Discharge Plan: SNF    Current Diagnoses: Patient Active Problem List   Diagnosis Date Noted   Acute hypoxemic respiratory failure ([place]) 09/07/2021   Pleural effusion    Dementia without behavioral disturbance, psychotic disturbance, mood disturbance, or anxiety ([place])    History of 2019 novel coronavirus disease (COVID-19)    Sepsis without acute organ dysfunction ([place])    Prediabetes    Weakness    COVID-19 virus infection 08/27/2021   Chronic renal disease, stage III ([place]) 08/27/2021   Acute on chronic heart failure with preserved ejection fraction (HFpEF) ([place]) 07/25/2020   Acute exacerbation of CHF (congestive heart failure) ([place]) 07/24/2020   Constipation 07/24/2020   Leukocytosis 07/24/2020   Elevated brain natriuretic peptide (BNP) level 07/24/2020   Hospital acquired PNA 07/24/2020   OSA (obstructive sleep apnea) 07/10/2020   Nocturnal hypoxia 07/10/2020   Acute confusion 07/10/2020   CVA (cerebral vascular accident) ([place]) 06/19/2019   Acute encephalopathy 06/18/2019   Acute on chronic respiratory failure with hypoxia ([place]) 09/10/2018   Chronic kidney disease (CKD), stage II (mild) 09/10/2018   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   CHF exacerbation ([place]) 09/09/2018   ARF (acute renal failure) ([place]) 07/10/2018   Nausea & vomiting 07/09/2018    Hypokalemia 12/25/2017   Acute on chronic diastolic CHF (congestive heart failure) ([place]) 12/24/2017   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   Rupture of right patellar tendon 05/19/2016   H/O nonunion of fracture 03/16/2016   Arthritis of knee 02/17/2015   Primary osteoarthritis of right knee Valgus 02/14/2015   Chronic diastolic CHF (congestive heart failure) ([place]) 02/06/2015   Hyperglycemia 01/28/2015   Proximal humerus fracture 06/10/2014   Obesity 03/18/2012   HTN (hypertension) 03/18/2012   GERD (gastroesophageal reflux disease) 03/18/2012   Hyperlipidemia with target LDL less than 70 03/18/2012   Sleep disturbance 03/18/2012   Atrial fibrillation ([place]) 03/18/2012    Orientation RESPIRATION BLADDER Height & Weight     Self  O2 (3L) External catheter Weight: 155 lb 3.3 oz (70.4 kg) Height:  5\' 8"  (172.7 cm)  BEHAVIORAL SYMPTOMS/MOOD NEUROLOGICAL BOWEL NUTRITION STATUS      Continent Diet (Heart healthy. See d/c summary for updates.)  AMBULATORY STATUS COMMUNICATION OF NEEDS Skin   Extensive Assist Verbally Bruising                       Personal Care Assistance Level of Assistance  Bathing, Feeding, Dressing Bathing Assistance: Maximum assistance Feeding assistance: Limited assistance Dressing Assistance: Maximum assistance     Functional Limitations Info  Sight, Hearing, Speech Sight Info: Adequate Hearing Info: Impaired Speech Info: Adequate    SPECIAL CARE FACTORS FREQUENCY  PT (By licensed PT)     PT Frequency: 5x weekly              Contractures Contractures  Info: Not present    Additional Factors Info  Allergies, Code Status Code Status Info: DNR Allergies Info: Codeine, Penicillins, Elemental Sulfur           Current Medications (09/09/2021):  This is the current hospital active medication list Current Facility-Administered Medications  Medication Dose Route Frequency Provider Last Rate Last Admin   apixaban (ELIQUIS) tablet 5  mg  5 mg Oral BID Emeterio Reeve, DO   5 mg at 09/09/21 [id number]   ascorbic acid (VITAMIN C) tablet 500 mg  500 mg Oral Daily Emeterio Reeve, DO   500 mg at 09/09/21 [id number]   atorvastatin (LIPITOR) tablet 40 mg  40 mg Oral Daily Emeterio Reeve, DO   40 mg at 09/09/21 0926   azithromycin (ZITHROMAX) 500 mg in sodium chloride 0.9 % 250 mL IVPB  500 mg Intravenous Q24H Emokpae, Courage, MD 250 mL/hr at 09/08/21 2046 500 mg at 09/08/21 2046   ceFEPIme (MAXIPIME) 2 g in sodium chloride 0.9 % 100 mL IVPB  2 g Intravenous Q12H Skeet Simmer, RPH 200 mL/hr at 09/09/21 0226 2 g at 09/09/21 0226   diltiazem (CARDIZEM CD) 24 hr capsule 180 mg  180 mg Oral Daily Emeterio Reeve, DO   180 mg at 09/09/21 0931   DULoxetine (CYMBALTA) DR capsule 60 mg  60 mg Oral Daily Emeterio Reeve, DO   60 mg at 09/09/21 0925   guaiFENesin-dextromethorphan (ROBITUSSIN DM) 100-10 MG/5ML syrup 10 mL  10 mL Oral Q4H PRN Emeterio Reeve, DO       insulin aspart (novoLOG) injection 0-15 Units  0-15 Units Subcutaneous TID WC Emeterio Reeve, DO   5 Units at 09/08/21 1245   insulin aspart (novoLOG) injection 0-5 Units  0-5 Units Subcutaneous QHS Emeterio Reeve, DO       insulin aspart (novoLOG) injection 3 Units  3 Units Subcutaneous TID WC Emeterio Reeve, DO   3 Units at 09/08/21 1245   insulin detemir (LEVEMIR) injection 7 Units  7 Units Subcutaneous Daily Emeterio Reeve, DO   7 Units at 09/08/21 1047   isosorbide mononitrate (IMDUR) 24 hr tablet 30 mg  30 mg Oral Daily Emeterio Reeve, DO   30 mg at 09/09/21 [id number]   LORazepam (ATIVAN) tablet 0.5 mg  0.5 mg Oral TID PRN Emeterio Reeve, DO       metoprolol tartrate (LOPRESSOR) tablet 50 mg  50 mg Oral BID Emeterio Reeve, DO   50 mg at 09/09/21 [id number]   polyethylene glycol (MIRALAX / GLYCOLAX) packet 17 g  17 g Oral Daily Emeterio Reeve, DO   17 g at 09/09/21 0931   potassium chloride SA (KLOR-CON M) CR tablet 20 mEq  20 mEq Oral Daily  Emeterio Reeve, DO   20 mEq at 09/09/21 0931   traMADol (ULTRAM) tablet 50 mg  50 mg Oral Q8H PRN Emeterio Reeve, DO       zinc sulfate capsule 220 mg  220 mg Oral Daily Emeterio Reeve, DO   220 mg at 09/09/21 [id number]     Discharge Medications: Please see discharge summary for a list of discharge medications.  Relevant Imaging Results:  Relevant Lab Results:   Additional Information SS# [id number]  Salome Arnt, [place]

## 2021-09-09 NOTE — Progress Notes (Signed)
PROGRESS NOTE     Carrie Best, is a 88 y.o. female, DOB - 03-Mar-1928, LYY:[id number]  Admit date - 09/07/2021   Admitting Physician Carrie Reeve, DO  Outpatient Primary MD for the patient is Carrie Orn, MD  LOS - 2  Chief Complaint  Patient presents with   Shortness of Breath        Brief Narrative:  88 y.o. female.   has a past medical history of Anxiety, Arthritis, Congestive heart failure ([place]), Depression, Diastolic dysfunction, Dyspnea, Dysrhythmia (05-10-12), GERD (gastroesophageal reflux disease) (05-10-12), Hearing loss (05-10-12), Heart murmur, History of kidney stones, Hyperlipidemia, Hypertension, Pneumonia (05-10-12), Stroke ([place]) (05-10-12), and Wears hearing aid was admitted on 09/07/2021  with acute hypoxic respiratory failure in the setting of recent COVID-19 infection  Assessment & Plan:   Principal Problem:   Acute hypoxemic respiratory failure ([place]) Active Problems:   Hospital acquired PNA   1) sepsis secondary to bilateral pneumonia--patient with recent COVID-19 infection -Met sepsis criteria on admission with leukocytosis, fevers, tachycardia, tachypnea and finding of pneumonia --Admission chest x-ray with bibasilar pneumonia and small left pleural effusion WBC 14.6 >> 11.7; continue to follow trend. -Lactic acid was greater than 2 -MRSA PCR negative; vancomycin has been discontinued. -Continue IV cefepime and Zithromax -Follow clinical response and culture results.  2) acute hypoxic respiratory failure =--- Secondary to 1 above -Continue to attempt to wean off oxygen currently requiring 2 L of oxygen via nasal cannula  3)PAFIB- -continue apixaban for stroke prophylaxis -continue Cardizem and metoprolol metoprolol for rate control  4)Covid 19--hx recent COVID + 08/27/21---  -Continue symptomatic and supportive care  -patient is vaccinated but not boosted - 12/22>>09/01/21 received Remdesivir x 5 days.  5)HTN/HFpEF--- patient on chronic diastolic  CHF, no evidence of acute CHF exacerbation at this time -Continue metoprolol, as well as isosorbide -Stop torsemide due to sepsis and concerns for dehydration  6)CKD 3B -Overall stable and at baseline -renally adjust medications, avoid nephrotoxic agents / dehydration  / hypotension. -Continue to follow renal function trend.   7)Social/Ethics---  -patient is a DNR/DNI, readmitted from SNF  8)DEmentia/Depression/Anxiety- -patient is not agitated, continue Cymbalta, use lorazepam as needed -Continue providing constant reorientation.  9)DM2-A1c 6.1 reflecting excellent diabetic control PTA, Use Novolog/Humalog Sliding scale insulin with Accu-Cheks/Fingersticks as ordered   10) hyponatremia/hypokalemia- -Continue to follow electrolytes and further replete as needed -Continue holding torsemide for now.  Disposition/Need for in-Hospital Stay- patient unable to be discharged at this time due to sepsis secondary to pneumonia requiring IV fluids and IV antibiotics -Possible discharge back to SNF in 1 to 2 days if improves  Status is: Inpatient  Remains inpatient appropriate because: As above  Disposition: The patient is from: SNF              Anticipated d/c is to: SNF              Anticipated d/c date is: 2 days              Patient currently is not medically stable to d/c.  Barriers: Not Clinically Stable-   Code Status :  -  Code Status: DNR   Family Communication: No family at bedside.  Consults  :  Na  DVT Prophylaxis  :   - SCDs  apixaban (ELIQUIS) tablet 5 mg    Lab Results  Component Value Date   PLT 164 09/09/2021    Inpatient Medications  Scheduled Meds:  apixaban  5 mg Oral BID   ascorbic  acid  500 mg Oral Daily   atorvastatin  40 mg Oral Daily   diltiazem  180 mg Oral Daily   DULoxetine  60 mg Oral Daily   insulin aspart  0-15 Units Subcutaneous TID WC   insulin aspart  0-5 Units Subcutaneous QHS   insulin aspart  3 Units Subcutaneous TID WC    insulin detemir  7 Units Subcutaneous Daily   isosorbide mononitrate  30 mg Oral Daily   metoprolol tartrate  50 mg Oral BID   polyethylene glycol  17 g Oral Daily   potassium chloride SA  20 mEq Oral Daily   zinc sulfate  220 mg Oral Daily   Continuous Infusions:  azithromycin 500 mg (09/08/21 2046)   ceFEPime (MAXIPIME) IV 2 g (09/09/21 1348)   PRN Meds:.guaiFENesin-dextromethorphan, LORazepam, traMADol   Anti-infectives (From admission, onward)    Start     Dose/Rate Route Frequency Ordered Stop   09/09/21 0600  vancomycin (VANCOREADY) IVPB 1250 mg/250 mL  Status:  Discontinued        1,250 mg 166.7 mL/hr over 90 Minutes Intravenous Every 36 hours 09/07/21 1629 09/08/21 0842   09/08/21 2045  azithromycin (ZITHROMAX) 500 mg in sodium chloride 0.9 % 250 mL IVPB        500 mg 250 mL/hr over 60 Minutes Intravenous Every 24 hours 09/08/21 1945     09/08/21 1800  levofloxacin (LEVAQUIN) IVPB 750 mg  Status:  Discontinued        750 mg 100 mL/hr over 90 Minutes Intravenous Every 48 hours 09/07/21 2013 09/08/21 1008   09/08/21 1800  vancomycin (VANCOREADY) IVPB 750 mg/150 mL  Status:  Discontinued        750 mg 150 mL/hr over 60 Minutes Intravenous Every 24 hours 09/08/21 0842 09/08/21 1518   09/08/21 0200  ceFEPIme (MAXIPIME) 2 g in sodium chloride 0.9 % 100 mL IVPB        2 g 200 mL/hr over 30 Minutes Intravenous Every 12 hours 09/07/21 2058     09/07/21 2200  cefTRIAXone (ROCEPHIN) 2 g in sodium chloride 0.9 % 100 mL IVPB  Status:  Discontinued        2 g 200 mL/hr over 30 Minutes Intravenous Every 24 hours 09/07/21 1816 09/07/21 2012   09/07/21 1830  azithromycin (ZITHROMAX) tablet 500 mg  Status:  Discontinued        500 mg Oral Daily 09/07/21 1816 09/07/21 2012   09/07/21 1700  ceFEPIme (MAXIPIME) 1 g in sodium chloride 0.9 % 100 mL IVPB        1 g 200 mL/hr over 30 Minutes Intravenous  Once 09/07/21 1616 09/07/21 1725   09/07/21 1700  vancomycin (VANCOREADY) IVPB 1500 mg/300  mL        1,500 mg 150 mL/hr over 120 Minutes Intravenous  Once 09/07/21 1625 09/07/21 1934         Subjective: Carrie Best patient reports improvement in her breathing; still with a scattered productive coughing spells.  Using 2-3 L nasal collar supplementation.   Objective: Vitals:   09/08/21 2038 09/09/21 0355 09/09/21 0927 09/09/21 1417  BP: 138/60 137/66 (!) 151/60 (!) 121/53  Pulse: 70 (!) 55 80 (!) 56  Resp: 20 18  18   Temp: 98.2 F (36.8 C) 98.7 F (37.1 C)  98.1 F (36.7 C)  TempSrc: Oral Oral    SpO2: 92% 90%  98%  Weight:      Height:        Intake/Output  Summary (Last 24 hours) at 09/09/2021 1735 Last data filed at 09/09/2021 0900 Gross per 24 hour  Intake 850.11 ml  Output --  Net 850.11 ml   Filed Weights   09/07/21 2200  Weight: 70.4 kg     Physical Exam General exam: Alert, awake and following commands appropriately.  Patient is significantly hard of hearing and no hearing aids available.  Currently afebrile, reports breathing is slightly better.  No chest pain Respiratory system: Using 2-3 L nasal cannula supplementation; no using accessory muscle.  Appreciated  rhonchi bilaterally. Cardiovascular system: Rate controlled, no rubs, no gallops, no JVD on exam. Gastrointestinal system: Abdomen is nondistended, soft and nontender. No organomegaly or masses felt. Normal bowel sounds heard. Central nervous system: No focal neurological deficits. Extremities: No cyanosis or clubbing. Skin: No petechiae. Psychiatry: Mood & affect appropriate.   Data Reviewed: I have personally reviewed following labs and imaging studies  CBC: Recent Labs  Lab 09/07/21 1536 09/08/21 0544 09/09/21 0647  WBC 14.6* 11.1* 11.7*  NEUTROABS 12.7*  --   --   HGB 12.1 10.7* 11.3*  HCT 35.4* 31.5* 33.4*  MCV 92.7 93.5 93.0  PLT 178 150 726   Basic Metabolic Panel: Recent Labs  Lab 09/07/21 1536 09/08/21 0544 09/09/21 0641 09/09/21 0647  NA 130* 132* 131*  --   K  3.5 3.1* 4.7  --   CL 89* 91* 98  --   CO2 32 33* 24  --   GLUCOSE 246* 139* 113*  --   BUN 20 22 25*  --   CREATININE 1.01* 1.08* 1.04*  --   CALCIUM 8.8* 8.8* 8.5*  --   MG  --   --   --  2.1  PHOS  --   --  2.6  --    GFR: Estimated Creatinine Clearance: 35.5 mL/min (A) (by C-G formula based on SCr of 1.04 mg/dL (H)).  Liver Function Tests: Recent Labs  Lab 09/07/21 1536 09/09/21 0641  AST 20  --   ALT 17  --   ALKPHOS 104  --   BILITOT 1.0  --   PROT 5.7*  --   ALBUMIN 2.8* 2.6*   CBG: Recent Labs  Lab 09/08/21 1626 09/08/21 2157 09/09/21 0735 09/09/21 1202 09/09/21 1655  GLUCAP 78 148* 114* 171* 120*   Urine analysis:    Component Value Date/Time   COLORURINE YELLOW 08/27/2021 [place] 08/27/2021 1412   LABSPEC 1.015 08/27/2021 1412   PHURINE 6.5 08/27/2021 1412   GLUCOSEU NEGATIVE 08/27/2021 1412   HGBUR NEGATIVE 08/27/2021 [place] 08/27/2021 1412   [place] 08/27/2021 1412   PROTEINUR NEGATIVE 08/27/2021 1412   UROBILINOGEN 0.2 02/07/2015 1534   NITRITE NEGATIVE 08/27/2021 1412   LEUKOCYTESUR NEGATIVE 08/27/2021 1412   Sepsis Labs:  Recent Results (from the past 240 hour(s))  Blood culture (routine x 2)     Status: None (Preliminary result)   Collection Time: 09/07/21  4:19 PM   Specimen: Right Antecubital; Blood  Result Value Ref Range Status   Specimen Description RIGHT ANTECUBITAL  Final   Special Requests   Final    BOTTLES DRAWN AEROBIC AND ANAEROBIC Blood Culture adequate volume   Culture   Final    NO GROWTH 2 DAYS Performed at [facility], [address] [place], [place] [address]    Report Status PENDING  Incomplete  Blood culture (routine x 2)     Status: None (Preliminary result)  Collection Time: 09/07/21  4:19 PM   Specimen: Left Antecubital; Blood  Result Value Ref Range Status   Specimen Description LEFT ANTECUBITAL  Final   Special Requests   Final    BOTTLES DRAWN AEROBIC  AND ANAEROBIC Blood Culture adequate volume   Culture   Final    NO GROWTH 2 DAYS Performed at [facility], [address], [place], [place] [address]    Report Status PENDING  Incomplete  Resp Panel by RT-PCR (Flu A&B, Covid) Nasopharyngeal Swab     Status: Abnormal   Collection Time: 09/07/21  6:27 PM   Specimen: Nasopharyngeal Swab; Nasopharyngeal(NP) swabs in vial transport medium  Result Value Ref Range Status   SARS Coronavirus 2 by RT PCR POSITIVE (A) NEGATIVE Final    Comment: (NOTE) SARS-CoV-2 target nucleic acids are DETECTED.  The SARS-CoV-2 RNA is generally detectable in upper respiratory specimens during the acute phase of infection. Positive results are indicative of the presence of the identified virus, but do not rule out bacterial infection or co-infection with other pathogens not detected by the test. Clinical correlation with patient history and other diagnostic information is necessary to determine patient infection status. The expected result is Negative.  Fact Sheet for Patients: EntrepreneurPulse.com.au  Fact Sheet for Healthcare Providers: IncredibleEmployment.be  This test is not yet approved or cleared by the [place] FDA and  has been authorized for detection and/or diagnosis of SARS-CoV-2 by FDA under an Emergency Use Authorization (EUA).  This EUA will remain in effect (meaning this test can be used) for the duration of  the COVID-19 declaration under Section 564(b)(1) of the A ct, 21 U.S.C. section 360bbb-3(b)(1), unless the authorization is terminated or revoked sooner.     Influenza A by PCR NEGATIVE NEGATIVE Final   Influenza B by PCR NEGATIVE NEGATIVE Final    Comment: (NOTE) The Xpert Xpress SARS-CoV-2/FLU/RSV plus assay is intended as an aid in the diagnosis of influenza from Nasopharyngeal swab specimens and should not be used as a sole basis for treatment. Nasal washings and aspirates are  unacceptable for Xpert Xpress SARS-CoV-2/FLU/RSV testing.  Fact Sheet for Patients: EntrepreneurPulse.com.au  Fact Sheet for Healthcare Providers: IncredibleEmployment.be  This test is not yet approved or cleared by the [place] FDA and has been authorized for detection and/or diagnosis of SARS-CoV-2 by FDA under an Emergency Use Authorization (EUA). This EUA will remain in effect (meaning this test can be used) for the duration of the COVID-19 declaration under Section 564(b)(1) of the Act, 21 U.S.C. section 360bbb-3(b)(1), unless the authorization is terminated or revoked.  Performed at [facility], [address] [place], [place] [address]   MRSA Next Gen by PCR, Nasal     Status: None   Collection Time: 09/08/21 12:24 PM   Specimen: Nasal Mucosa; Nasal Swab  Result Value Ref Range Status   MRSA by PCR Next Gen NOT DETECTED NOT DETECTED Final    Comment: (NOTE) The GeneXpert MRSA Assay (FDA approved for NASAL specimens only), is one component of a comprehensive MRSA colonization surveillance program. It is not intended to diagnose MRSA infection nor to guide or monitor treatment for MRSA infections. Test performance is not FDA approved in patients less than 48 years old. Performed at [facility], [address], [place],  [address]      Radiology Studies: No results found.   Scheduled Meds:  apixaban  5 mg Oral BID   ascorbic acid  500 mg Oral Daily   atorvastatin  40 mg Oral Daily   diltiazem  180 mg Oral Daily   DULoxetine  60 mg Oral Daily   insulin aspart  0-15 Units Subcutaneous TID WC   insulin aspart  0-5 Units Subcutaneous QHS   insulin aspart  3 Units Subcutaneous TID WC   insulin detemir  7 Units Subcutaneous Daily   isosorbide mononitrate  30 mg Oral Daily   metoprolol tartrate  50 mg Oral BID   polyethylene glycol  17 g Oral Daily   potassium chloride SA  20 mEq Oral Daily   zinc sulfate  220 mg Oral  Daily   Continuous Infusions:  azithromycin 500 mg (09/08/21 2046)   ceFEPime (MAXIPIME) IV 2 g (09/09/21 1348)     LOS: 2 days    Barton Dubois M.D on 09/09/2021 at 5:35 PM  Go to [email or web address] - for contact info  [facility] - Office  [phone]  If 7PM-7AM, please contact night-coverage [email or web address] Password [facility] 09/09/2021, 5:35 PM

## 2021-09-09 NOTE — TOC Initial Note (Addendum)
Transition of Care [facility]) - Initial/Assessment Note    Patient Details  Name: Carrie Best MRN: [id number] Date of Birth: [birth year]  [facility]) CM/SW Contact:    Salome Arnt, [place] Phone Number: 09/09/2021, 12:02 PM  Clinical Narrative:  Pt admitted from [facility]. Per Jackelyn Poling at facility, okay for return. Pt will require new authorization. MD notified for PT evaluation. Pt oriented to self only per chart. LCSW left voicemails for both [name] and Dominica Severin listed on chart and requested return call. TOC will continue to follow and anticipate return to [facility] when medically stable.                 Update: Pt's daughter-in-law [name] returned call and requests new SNF in [place]. She is aware we will initiate bed search, but if no bed is available, pt will need to return to [facility] or go home.   Expected Discharge Plan: Skilled Nursing Facility Barriers to Discharge: Continued Medical Work up   Patient Goals and CMS Choice Patient states their goals for this hospitalization and ongoing recovery are:: anticipate return to SNF      Expected Discharge Plan and Services Expected Discharge Plan: [place] In-house Referral: Clinical Social Work     Living arrangements for the past 2 months: [place]                                      Prior Living Arrangements/Services Living arrangements for the past 2 months: [place] Lives with:: Facility Resident Patient language and need for interpreter reviewed:: Yes        Need for Family Participation in Patient Care: Yes (Comment) Care giver support system in place?: Yes (comment)   Criminal Activity/Legal Involvement Pertinent to Current Situation/Hospitalization: No - Comment as needed  Activities of Daily Living Home Assistive Devices/Equipment: Wheelchair, Environmental consultant (specify type), Transfer board, Transfer belt, Shower chair with back,  Raised toilet seat with rails, Oxygen, Hearing aid, Grab bars in shower, Grab bars around toilet ADL Screening (condition at time of admission) Patient's cognitive ability adequate to safely complete daily activities?: No Is the patient deaf or have difficulty hearing?: Yes Does the patient have difficulty seeing, even when wearing glasses/contacts?: Yes Does the patient have difficulty concentrating, remembering, or making decisions?: Yes Patient able to express need for assistance with ADLs?: Yes Does the patient have difficulty dressing or bathing?: Yes Independently performs ADLs?: No Communication: Needs assistance Is this a change from baseline?: Pre-admission baseline Dressing (OT): Dependent Is this a change from baseline?: Pre-admission baseline Grooming: Dependent Is this a change from baseline?: Pre-admission baseline Feeding: Appropriate for developmental age Bathing: Needs assistance Is this a change from baseline?: Pre-admission baseline Toileting: Dependent Is this a change from baseline?: Pre-admission baseline In/Out Bed: Dependent Is this a change from baseline?: Pre-admission baseline Walks in Home: Dependent Is this a change from baseline?: Pre-admission baseline Does the patient have difficulty walking or climbing stairs?: Yes Weakness of Legs: Both Weakness of Arms/Hands: Both  Permission Sought/Granted                  Emotional Assessment       Orientation: : Oriented to Self Alcohol / Substance Use: Not Applicable Psych Involvement: No (comment)  Admission diagnosis:  Pleural effusion [facility] acquired PNA [J18.9, Y95] Acute hypoxemic respiratory failure ([place]) [J96.01] Patient Active  Problem List   Diagnosis Date Noted   Acute hypoxemic respiratory failure ([place]) 09/07/2021   Pleural effusion    Dementia without behavioral disturbance, psychotic disturbance, mood disturbance, or anxiety ([place])    History of 2019 novel coronavirus  disease (COVID-19)    Sepsis without acute organ dysfunction ([place])    Prediabetes    Weakness    COVID-19 virus infection 08/27/2021   Chronic renal disease, stage III ([place]) 08/27/2021   Acute on chronic heart failure with preserved ejection fraction (HFpEF) ([place]) 07/25/2020   Acute exacerbation of CHF (congestive heart failure) ([place]) 07/24/2020   Constipation 07/24/2020   Leukocytosis 07/24/2020   Elevated brain natriuretic peptide (BNP) level 07/24/2020   Hospital acquired PNA 07/24/2020   OSA (obstructive sleep apnea) 07/10/2020   Nocturnal hypoxia 07/10/2020   Acute confusion 07/10/2020   CVA (cerebral vascular accident) ([place]) 06/19/2019   Acute encephalopathy 06/18/2019   Acute on chronic respiratory failure with hypoxia ([place]) 09/10/2018   Chronic kidney disease (CKD), stage II (mild) 09/10/2018   Amyloidosis of bladder ([place]) 09/10/2018   CHF exacerbation ([place]) 09/09/2018   ARF (acute renal failure) ([place]) 07/10/2018   Nausea & vomiting 07/09/2018   Hypokalemia 12/25/2017   Acute on chronic diastolic CHF (congestive heart failure) ([place]) 12/24/2017   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/19/2016   Rupture of right patellar tendon 05/19/2016   H/O nonunion of fracture 03/16/2016   Arthritis of knee 02/17/2015   Primary osteoarthritis of right knee Val[place] 02/14/2015   Chronic diastolic CHF (congestive heart failure) ([place]) 02/06/2015   Hyperglycemia 01/28/2015   Proximal humerus fracture 06/10/2014   Obesity 03/18/2012   HTN (hypertension) 03/18/2012   GERD (gastroesophageal reflux disease) 03/18/2012   Hyperlipidemia with target LDL less than 70 03/18/2012   Sleep disturbance 03/18/2012   Atrial fibrillation ([place]) 03/18/2012   PCP:  [name], MD Pharmacy:   [facility] - [place], [place] - [address] [place] [address] Phone: [phone] Fax: [phone]     Social Determinants of Health (SDOH)  Interventions    Readmission Risk Interventions Readmission Risk Prevention Plan 09/09/2021 09/01/2021  Transportation Screening Complete Complete  PCP or Specialist Appt within 5-7 Days - Complete  Home Care Screening - Complete  Medication Review (RN CM) - Complete  HRI or Home Care Consult Complete -  Social Work Consult for [place] Planning/Counseling Complete -  Palliative Care Screening Not Applicable -  Medication Review (RN Care Manager) Complete -  Some recent data might be hidden

## 2021-09-10 DIAGNOSIS — R5381 Other malaise: Secondary | ICD-10-CM

## 2021-09-10 LAB — GLUCOSE, CAPILLARY
Glucose-Capillary: 98 mg/dL (ref 70–99)
Glucose-Capillary: 120 mg/dL — ABNORMAL HIGH (ref 70–99)
Glucose-Capillary: 143 mg/dL — ABNORMAL HIGH (ref 70–99)
Glucose-Capillary: 160 mg/dL — ABNORMAL HIGH (ref 70–99)

## 2021-09-10 NOTE — Progress Notes (Signed)
Pharmacy Antibiotic Note  Carrie Best is a 88 y.o. female admitted on 09/07/2021 with PNA/sepsis.  Pharmacy has been consulted for Cefepime dosing.  Plan: Cefepime 2gm IV q12h   Height: 5\' 8"  (172.7 cm) Weight: 70.4 kg (155 lb 3.3 oz) IBW/kg (Calculated) : 63.9  Temp (24hrs), Avg:98.4 F (36.9 C), Min:98.1 F (36.7 C), Max:98.7 F (37.1 C)  Recent Labs  Lab 09/07/21 1536 09/07/21 1619 09/07/21 1801 09/08/21 0544 09/09/21 0641 09/09/21 0647  WBC 14.6*  --   --  11.1*  --  11.7*  CREATININE 1.01*  --   --  1.08* 1.04*  --   LATICACIDVEN  --  2.6* 2.2*  --   --   --      Estimated Creatinine Clearance: 35.5 mL/min (A) (by C-G formula based on SCr of 1.04 mg/dL (H)).    Allergies  Allergen Reactions   Codeine Nausea And Vomiting   Elemental Sulfur Nausea And Vomiting   Penicillins Hives    Has patient had a PCN reaction causing immediate rash, facial/tongue/throat swelling, SOB or lightheadedness with hypotension: No Has patient had a PCN reaction causing severe rash involving mucus membranes or skin necrosis: No Has patient had a PCN reaction that required hospitalization No Has patient had a PCN reaction occurring within the last 10 years: No If all of the above answers are "NO", then may proceed with Cephalosporin use.     Antimicrobials this admission:  Vanc 1/2   Cefepime 1/2>> Azith 1/3 >>  Azithro  1/2   Dose adjustments this admission:   Microbiology results:   1/2 COVID: positive (recent + 12/22 and got Remdes x 5 daily)  1/2 flu: negative  1/2 blood x 2: ngtd  1/3 MRSA PCR: neg  Thank you for allowing pharmacy to be a part of this patients care.  1/23 09/10/2021 8:43 AM

## 2021-09-10 NOTE — TOC Progression Note (Signed)
Transition of Care [facility]) - Progression Note    Patient Details  Name: Carrie Best MRN: [id number] Date of Birth: 15-Jun-1928  [facility]) CM/SW Contact  Villa Herb, [place] Phone Number: 09/10/2021, 1:40 PM  Clinical Narrative:    CSW spoke with pts daughter in law [name] who states that she is looking at two facilities in [place] where they will private pay for pts care. The facilities are [address] and [facility] (on [address] [name] states that she has an appointment to view one facility today. [name] will view the second facility tomorrow. CSW explained that if the process for placement is not timely pt will not be able to board in the hospital and another plan will need to be made. TOC to follow.   Expected Discharge Plan: Skilled Nursing Facility Barriers to Discharge: Continued Medical Work up  Expected Discharge Plan and Services Expected Discharge Plan: Skilled Nursing Facility In-house Referral: Clinical Social Work     Living arrangements for the past 2 months: Skilled Nursing Facility                                       Social Determinants of Health (SDOH) Interventions    Readmission Risk Interventions Readmission Risk Prevention Plan 09/09/2021 09/01/2021  Transportation Screening Complete Complete  PCP or Specialist Appt within 5-7 Days - Complete  Home Care Screening - Complete  Medication Review (RN CM) - Complete  HRI or Home Care Consult Complete -  Social Work Consult for [place] Care Planning/Counseling Complete -  Palliative Care Screening Not Applicable -  Medication Review Oceanographer) Complete -  Some recent data might be hidden

## 2021-09-10 NOTE — Progress Notes (Signed)
PROGRESS NOTE     Carrie Best, is a 88 y.o. female, DOB - Sep 09, 1927, [id number]  Admit date - 09/07/2021   Admitting Physician Emeterio Reeve, DO  Outpatient Primary MD for the patient is [name], MD  LOS - 3  Chief Complaint  Patient presents with   Shortness of Breath        Brief Narrative:  88 y.o. female.   has a past medical history of Anxiety, Arthritis, Congestive heart failure ([place]), Depression, Diastolic dysfunction, Dyspnea, Dysrhythmia (05-10-12), GERD (gastroesophageal reflux disease) (05-10-12), Hearing loss (05-10-12), Heart murmur, History of kidney stones, Hyperlipidemia, Hypertension, Pneumonia (05-10-12), Stroke ([place]) (05-10-12), and Wears hearing aid was admitted on 09/07/2021  with acute hypoxic respiratory failure in the setting of recent COVID-19 infection.  Assessment & Plan:   Principal Problem:   Acute hypoxemic respiratory failure (HCC) Active Problems:   Hospital acquired PNA   1) sepsis secondary to bilateral pneumonia--patient with recent COVID-19 infection -Met sepsis criteria on admission with leukocytosis, fevers, tachycardia, tachypnea and finding of pneumonia -Admission chest x-ray with bibasilar pneumonia and small left pleural effusion WBC 14.6 >> 11.7; continue to follow trend. -Lactic acid was greater than 2 at time of admission. -MRSA PCR negative; vancomycin has been discontinued. -Continue IV cefepime and Zithromax -Follow clinical response and culture results.  2) acute hypoxic respiratory failure =--- Secondary to 1 above -Continue to attempt to wean off oxygen currently requiring 2 L of oxygen via nasal cannula  3)PAFIB- -continue apixaban for stroke prophylaxis -continue Cardizem and metoprolol for rate control -HR stable currently.  4)Covid 19--hx recent COVID + 08/27/21---  -Continue symptomatic and supportive care  -patient is vaccinated but not boosted -12/22>>09/01/21 received Remdesivir x 5  days.  5)HTN/HFpEF--- patient on chronic diastolic CHF, no evidence of acute CHF exacerbation at this time -Continue metoprolol, as well as isosorbide -Stop torsemide due to sepsis and concerns for dehydration. -Follow daily weights, strict intake and output.  6)CKD 3B -Overall stable and at baseline -Continue to renally adjust medications, avoid nephrotoxic agents / dehydration  / hypotension. -Continue to follow renal function trend.  7)Social/Ethics---  -patient is a DNR/DNI, readmitted from SNF. -Patient will require SNF at discharge for rehabilitation and conditioning.. -At this moment family is no looking for patient to return to same prior to admission SNF.  8)Dementia/Depression/Anxiety- -patient is not agitated, continue Cymbalta, use lorazepam as needed -Continue providing constant reorientation. -Mood is stable currently.  9)DM2-A1c 6.1 reflecting excellent diabetic control PTA, Use Novolog/Humalog Sliding scale insulin with Accu-Cheks/Fingersticks as ordered   10) hyponatremia/hypokalemia- -Continue holding torsemide for now. -Continue to follow electrolytes trends and further replete as needed.  Disposition/Need for in-Hospital Stay- patient is medically stable.  -Possible discharge back to SNF in 1 day; now pending bed availability at new receiving SNF.  Status is: Inpatient  Remains inpatient appropriate because: As above  Disposition: The patient is from: SNF              Anticipated d/c is to: SNF              Anticipated d/c date is: medically stable.              Patient currently is not medically stable to d/c.  Barriers: Not Clinically Stable-   Code Status :  -  Code Status: DNR   Family Communication: No family at bedside.  Consults  :  Na  DVT Prophylaxis  :   - SCDs  apixaban (ELIQUIS) tablet  5 mg    Lab Results  Component Value Date   PLT 164 09/09/2021   Inpatient Medications  Scheduled Meds:  apixaban  5 mg Oral BID   ascorbic  acid  500 mg Oral Daily   atorvastatin  40 mg Oral Daily   diltiazem  180 mg Oral Daily   DULoxetine  60 mg Oral Daily   insulin aspart  0-15 Units Subcutaneous TID WC   insulin aspart  0-5 Units Subcutaneous QHS   insulin aspart  3 Units Subcutaneous TID WC   insulin detemir  7 Units Subcutaneous Daily   isosorbide mononitrate  30 mg Oral Daily   metoprolol tartrate  50 mg Oral BID   polyethylene glycol  17 g Oral Daily   potassium chloride SA  20 mEq Oral Daily   zinc sulfate  220 mg Oral Daily   Continuous Infusions:  azithromycin 500 mg (09/09/21 2007)   ceFEPime (MAXIPIME) IV 2 g (09/10/21 0217)   PRN Meds:.guaiFENesin-dextromethorphan, LORazepam, traMADol   Anti-infectives (From admission, onward)    Start     Dose/Rate Route Frequency Ordered Stop   09/09/21 0600  vancomycin (VANCOREADY) IVPB 1250 mg/250 mL  Status:  Discontinued        1,250 mg 166.7 mL/hr over 90 Minutes Intravenous Every 36 hours 09/07/21 1629 09/08/21 0842   09/08/21 2045  azithromycin (ZITHROMAX) 500 mg in sodium chloride 0.9 % 250 mL IVPB        500 mg 250 mL/hr over 60 Minutes Intravenous Every 24 hours 09/08/21 1945     09/08/21 1800  levofloxacin (LEVAQUIN) IVPB 750 mg  Status:  Discontinued        750 mg 100 mL/hr over 90 Minutes Intravenous Every 48 hours 09/07/21 2013 09/08/21 1008   09/08/21 1800  vancomycin (VANCOREADY) IVPB 750 mg/150 mL  Status:  Discontinued        750 mg 150 mL/hr over 60 Minutes Intravenous Every 24 hours 09/08/21 0842 09/08/21 1518   09/08/21 0200  ceFEPIme (MAXIPIME) 2 g in sodium chloride 0.9 % 100 mL IVPB        2 g 200 mL/hr over 30 Minutes Intravenous Every 12 hours 09/07/21 2058     09/07/21 2200  cefTRIAXone (ROCEPHIN) 2 g in sodium chloride 0.9 % 100 mL IVPB  Status:  Discontinued        2 g 200 mL/hr over 30 Minutes Intravenous Every 24 hours 09/07/21 1816 09/07/21 2012   09/07/21 1830  azithromycin (ZITHROMAX) tablet 500 mg  Status:  Discontinued         500 mg Oral Daily 09/07/21 1816 09/07/21 2012   09/07/21 1700  ceFEPIme (MAXIPIME) 1 g in sodium chloride 0.9 % 100 mL IVPB        1 g 200 mL/hr over 30 Minutes Intravenous  Once 09/07/21 1616 09/07/21 1725   09/07/21 1700  vancomycin (VANCOREADY) IVPB 1500 mg/300 mL        1,500 mg 150 mL/hr over 120 Minutes Intravenous  Once 09/07/21 1625 09/07/21 1934       Subjective: Carrie Best in not acute distress, reports breathing is better and expressed no nausea or vomtiing. Good saturation on 2-3L [place] supplementation.   Objective: Vitals:   09/09/21 1417 09/09/21 2100 09/10/21 0558 09/10/21 1350  BP: (!) 121/53 136/62 136/63 133/78  Pulse: (!) 56 85 63 72  Resp: _0 Temp: 98.1 F (36.7 C)  98.7 F (37.1 C) (!) 97.3 F (  36.3 C)  TempSrc:    Oral  SpO2: 98% 100% 100% 97%  Weight:      Height:        Intake/Output Summary (Last 24 hours) at 09/10/2021 1544 Last data filed at 09/10/2021 1300 Gross per 24 hour  Intake 840 ml  Output 2450 ml  Net -1610 ml   Filed Weights   09/07/21 2200  Weight: 70.4 kg    Physical Exam General exam: Alert, awake, oriented x 2; hard of hearing and reporting intermittent coughing spells.  Breathing overall improved and is currently afebrile.  Generally weak. Respiratory system: Positive rhonchi bilaterally; no crackles, no using accessory muscle. Cardiovascular system: Rate controlled, no rubs, no gallops, no JVD on exam. Gastrointestinal system: Abdomen is nondistended, soft and nontender. No organomegaly or masses felt. Normal bowel sounds heard. Central nervous system: No focal deficits appreciated on exam. Extremities: No cyanosis or clubbing. Skin: No petechiae. Psychiatry: Mood & affect appropriate.    Data Reviewed: I have personally reviewed following labs and imaging studies  CBC: Recent Labs  Lab 09/07/21 1536 09/08/21 0544 09/09/21 0647  WBC 14.6* 11.1* 11.7*  NEUTROABS 12.7*  --   --   HGB 12.1 10.7* 11.3*  HCT  35.4* 31.5* 33.4*  MCV 92.7 93.5 93.0  PLT 178 150 096   Basic Metabolic Panel: Recent Labs  Lab 09/07/21 1536 09/08/21 0544 09/09/21 0641 09/09/21 0647  NA 130* 132* 131*  --   K 3.5 3.1* 4.7  --   CL 89* 91* 98  --   CO2 32 33* 24  --   GLUCOSE 246* 139* 113*  --   BUN 20 22 25*  --   CREATININE 1.01* 1.08* 1.04*  --   CALCIUM 8.8* 8.8* 8.5*  --   MG  --   --   --  2.1  PHOS  --   --  2.6  --    GFR: Estimated Creatinine Clearance: 35.5 mL/min (A) (by C-G formula based on SCr of 1.04 mg/dL (H)).  Liver Function Tests: Recent Labs  Lab 09/07/21 1536 09/09/21 0641  AST 20  --   ALT 17  --   ALKPHOS 104  --   BILITOT 1.0  --   PROT 5.7*  --   ALBUMIN 2.8* 2.6*   CBG: Recent Labs  Lab 09/09/21 1202 09/09/21 1655 09/09/21 2212 09/10/21 0806 09/10/21 1121  GLUCAP 171* 120* 149* 98 120*   Urine analysis:    Component Value Date/Time   COLORURINE YELLOW 08/27/2021 [place] 08/27/2021 1412   LABSPEC 1.015 08/27/2021 1412   PHURINE 6.5 08/27/2021 1412   GLUCOSEU NEGATIVE 08/27/2021 1412   HGBUR NEGATIVE 08/27/2021 [place] 08/27/2021 [place] 08/27/2021 1412   PROTEINUR NEGATIVE 08/27/2021 1412   UROBILINOGEN 0.2 02/07/2015 1534   NITRITE NEGATIVE 08/27/2021 1412   LEUKOCYTESUR NEGATIVE 08/27/2021 1412   Sepsis Labs:  Recent Results (from the past 240 hour(s))  Blood culture (routine x 2)     Status: None (Preliminary result)   Collection Time: 09/07/21  4:19 PM   Specimen: Right Antecubital; Blood  Result Value Ref Range Status   Specimen Description RIGHT ANTECUBITAL  Final   Special Requests   Final    BOTTLES DRAWN AEROBIC AND ANAEROBIC Blood Culture adequate volume   Culture   Final    NO GROWTH 3 DAYS Performed at [facility], [address], [place], [place] [address]  Report Status PENDING  Incomplete  Blood culture (routine x 2)     Status: None (Preliminary result)   Collection  Time: 09/07/21  4:19 PM   Specimen: Left Antecubital; Blood  Result Value Ref Range Status   Specimen Description LEFT ANTECUBITAL  Final   Special Requests   Final    BOTTLES DRAWN AEROBIC AND ANAEROBIC Blood Culture adequate volume   Culture   Final    NO GROWTH 3 DAYS Performed at [facility], [address], [place], [place] [address]    Report Status PENDING  Incomplete  Resp Panel by RT-PCR (Flu A&B, Covid) Nasopharyngeal Swab     Status: Abnormal   Collection Time: 09/07/21  6:27 PM   Specimen: Nasopharyngeal Swab; Nasopharyngeal(NP) swabs in vial transport medium  Result Value Ref Range Status   SARS Coronavirus 2 by RT PCR POSITIVE (A) NEGATIVE Final    Comment: (NOTE) SARS-CoV-2 target nucleic acids are DETECTED.  The SARS-CoV-2 RNA is generally detectable in upper respiratory specimens during the acute phase of infection. Positive results are indicative of the presence of the identified virus, but do not rule out bacterial infection or co-infection with other pathogens not detected by the test. Clinical correlation with patient history and other diagnostic information is necessary to determine patient infection status. The expected result is Negative.  Fact Sheet for Patients: EntrepreneurPulse.com.au  Fact Sheet for Healthcare Providers: IncredibleEmployment.be  This test is not yet approved or cleared by the [place] FDA and  has been authorized for detection and/or diagnosis of SARS-CoV-2 by FDA under an Emergency Use Authorization (EUA).  This EUA will remain in effect (meaning this test can be used) for the duration of  the COVID-19 declaration under Section 564(b)(1) of the A ct, 21 U.S.C. section 360bbb-3(b)(1), unless the authorization is terminated or revoked sooner.     Influenza A by PCR NEGATIVE NEGATIVE Final   Influenza B by PCR NEGATIVE NEGATIVE Final    Comment: (NOTE) The Xpert Xpress SARS-CoV-2/FLU/RSV  plus assay is intended as an aid in the diagnosis of influenza from Nasopharyngeal swab specimens and should not be used as a sole basis for treatment. Nasal washings and aspirates are unacceptable for Xpert Xpress SARS-CoV-2/FLU/RSV testing.  Fact Sheet for Patients: EntrepreneurPulse.com.au  Fact Sheet for Healthcare Providers: IncredibleEmployment.be  This test is not yet approved or cleared by the [place] FDA and has been authorized for detection and/or diagnosis of SARS-CoV-2 by FDA under an Emergency Use Authorization (EUA). This EUA will remain in effect (meaning this test can be used) for the duration of the COVID-19 declaration under Section 564(b)(1) of the Act, 21 U.S.C. section 360bbb-3(b)(1), unless the authorization is terminated or revoked.  Performed at [facility], [address], [place], [place] [address]   MRSA Next Gen by PCR, Nasal     Status: None   Collection Time: 09/08/21 12:24 PM   Specimen: Nasal Mucosa; Nasal Swab  Result Value Ref Range Status   MRSA by PCR Next Gen NOT DETECTED NOT DETECTED Final    Comment: (NOTE) The GeneXpert MRSA Assay (FDA approved for NASAL specimens only), is one component of a comprehensive MRSA colonization surveillance program. It is not intended to diagnose MRSA infection nor to guide or monitor treatment for MRSA infections. Test performance is not FDA approved in patients less than 56 years old. Performed at [facility], [address] [place], [place] [address]      Radiology Studies: No results found.  Scheduled Meds:  apixaban  5 mg Oral BID   ascorbic acid  500 mg Oral Daily   atorvastatin  40 mg Oral Daily   diltiazem  180 mg Oral Daily   DULoxetine  60 mg Oral Daily   insulin aspart  0-15 Units Subcutaneous TID WC   insulin aspart  0-5 Units Subcutaneous QHS   insulin aspart  3 Units Subcutaneous TID WC   insulin detemir  7 Units Subcutaneous Daily    isosorbide mononitrate  30 mg Oral Daily   metoprolol tartrate  50 mg Oral BID   polyethylene glycol  17 g Oral Daily   potassium chloride SA  20 mEq Oral Daily   zinc sulfate  220 mg Oral Daily   Continuous Infusions:  azithromycin 500 mg (09/09/21 2007)   ceFEPime (MAXIPIME) IV 2 g (09/10/21 0217)     LOS: 3 days    Barton Dubois M.D on 09/10/2021 at 3:44 PM  Go to [email or web address] - for contact info  [facility] - Office  [phone]  If 7PM-7AM, please contact night-coverage [email or web address] Password [facility] 09/10/2021, 3:44 PM

## 2021-09-11 LAB — GLUCOSE, CAPILLARY
Glucose-Capillary: 111 mg/dL — ABNORMAL HIGH (ref 70–99)
Glucose-Capillary: 85 mg/dL (ref 70–99)
Glucose-Capillary: 249 mg/dL — ABNORMAL HIGH (ref 70–99)
Glucose-Capillary: 120 mg/dL — ABNORMAL HIGH (ref 70–99)

## 2021-09-11 MED ORDER — AZITHROMYCIN 250 MG PO TABS
250.0000 mg | ORAL_TABLET | Freq: Every day | ORAL | Status: DC
  Administered 2021-09-11: 250 mg via ORAL
  Filled 2021-09-11: qty 1

## 2021-09-11 MED ORDER — CEFDINIR 300 MG PO CAPS
300.0000 mg | ORAL_CAPSULE | Freq: Two times a day (BID) | ORAL | Status: DC
  Administered 2021-09-11 – 2021-09-15 (×8): 300 mg via ORAL
  Filled 2021-09-11 (×8): qty 1

## 2021-09-11 NOTE — Care Management Important Message (Signed)
Important Message  Patient Details  Name: Carrie Best MRN: [id number] Date of Birth: Aug 11, 1928   Medicare Important Message Given:  Yes - Important Message mailed due to current National Emergency     Tommy Medal 09/11/2021, 3:58 PM

## 2021-09-11 NOTE — Progress Notes (Signed)
PROGRESS NOTE     Carrie Best, is a 88 y.o. female, DOB - 19-Oct-1927, MHW:[id number]  Admit date - 09/07/2021   Admitting Physician Emeterio Reeve, DO  Outpatient Primary MD for the patient is [name], MD  LOS - 4  Chief Complaint  Patient presents with   Shortness of Breath        Brief Narrative:  88 y.o. female.   has a past medical history of Anxiety, Arthritis, Congestive heart failure ([place]), Depression, Diastolic dysfunction, Dyspnea, Dysrhythmia (05-10-12), GERD (gastroesophageal reflux disease) (05-10-12), Hearing loss (05-10-12), Heart murmur, History of kidney stones, Hyperlipidemia, Hypertension, Pneumonia (05-10-12), Stroke ([place]) (05-10-12), and Wears hearing aid was admitted on 09/07/2021  with acute hypoxic respiratory failure in the setting of recent COVID-19 infection.  Assessment & Plan:   Principal Problem:   Acute hypoxemic respiratory failure (HCC) Active Problems:   Hospital acquired PNA   1) sepsis secondary to bilateral pneumonia--patient with recent COVID-19 infection -Met sepsis criteria on admission with leukocytosis, fevers, tachycardia, tachypnea and finding of pneumonia -Admission chest x-ray with bibasilar pneumonia and small left pleural effusion WBC 14.6 >> 11.7; continue to follow trend. -Lactic acid was greater than 2 at time of admission. -MRSA PCR negative; vancomycin has been discontinued. -Patient antibiotics will be transition to oral route. -Follow clinical response and culture results.  2) acute hypoxic respiratory failure =--- Secondary to 1 above -Continue to attempt to wean off oxygen currently requiring 2 L of oxygen via nasal cannula  3)PAFIB- -continue apixaban for stroke prophylaxis -continue Cardizem and metoprolol for rate control -HR stable currently.  4)Covid 19--hx recent COVID + 08/27/21---  -Continue symptomatic and supportive care  -patient is vaccinated but not boosted -12/22>>09/01/21 received Remdesivir x 5  days.  5)HTN/HFpEF--- patient on chronic diastolic CHF, no evidence of acute CHF exacerbation at this time -Continue metoprolol, as well as isosorbide -Stop torsemide due to sepsis and concerns for dehydration. -Follow daily weights, strict intake and output.  6)CKD 3B -Overall stable and at baseline -Continue to renally adjust medications, avoid nephrotoxic agents / dehydration  / hypotension. -Continue to follow renal function trend.  7)Social/Ethics---  -patient is a DNR/DNI, readmitted from SNF. -Patient will require SNF at discharge for rehabilitation and conditioning.. -At this moment family is no looking for patient to return to same prior to admission SNF; patient is significantly weak and deconditioned; high risk for falls and inability to perform ADLs on her own at this moment.  Unfortunately family members unable to provide around-the-clock supervision and assistance for her. -New receiving skilled nursing facility has been peaked and then will have ability to receive patient on 09/14/2021 at discharge.  8)Dementia/Depression/Anxiety- -patient is not agitated, continue Cymbalta, use lorazepam as needed -Continue providing constant reorientation. -Mood is stable currently.  9)DM2-A1c 6.1 reflecting excellent diabetic control PTA, Use Novolog/Humalog Sliding scale insulin with Accu-Cheks/Fingersticks as ordered   10) hyponatremia/hypokalemia- -Continue holding torsemide for now. -Continue to follow electrolytes trends and further replete as needed.  Disposition/Need for in-Hospital Stay- patient is medically stable.  -Possible discharge back to SNF in 2 days (09/14/2021); now pending bed availability at new receiving SNF; new facility has been picked, expecting able to take patient on Monday for further care and rehab.  Status is: Inpatient  Remains inpatient appropriate because: As above  Disposition: The patient is from: SNF              Anticipated d/c is to: SNF  Anticipated d/c date is: medically stable.              Patient currently is not medically stable to d/c.  Barriers: Not Clinically Stable-   Code Status :  -  Code Status: DNR   Family Communication: No family at bedside.  Consults  :  Na  DVT Prophylaxis  :   - SCDs  apixaban (ELIQUIS) tablet 5 mg    Lab Results  Component Value Date   PLT 164 09/09/2021   Inpatient Medications  Scheduled Meds:  apixaban  5 mg Oral BID   ascorbic acid  500 mg Oral Daily   atorvastatin  40 mg Oral Daily   diltiazem  180 mg Oral Daily   DULoxetine  60 mg Oral Daily   insulin aspart  0-15 Units Subcutaneous TID WC   insulin aspart  0-5 Units Subcutaneous QHS   insulin aspart  3 Units Subcutaneous TID WC   insulin detemir  7 Units Subcutaneous Daily   isosorbide mononitrate  30 mg Oral Daily   metoprolol tartrate  50 mg Oral BID   polyethylene glycol  17 g Oral Daily   potassium chloride SA  20 mEq Oral Daily   zinc sulfate  220 mg Oral Daily   Continuous Infusions:  azithromycin Stopped (09/10/21 2139)   ceFEPime (MAXIPIME) IV 2 g (09/11/21 1445)   PRN Meds:.guaiFENesin-dextromethorphan, LORazepam, traMADol   Anti-infectives (From admission, onward)    Start     Dose/Rate Route Frequency Ordered Stop   09/09/21 0600  vancomycin (VANCOREADY) IVPB 1250 mg/250 mL  Status:  Discontinued        1,250 mg 166.7 mL/hr over 90 Minutes Intravenous Every 36 hours 09/07/21 1629 09/08/21 0842   09/08/21 2045  azithromycin (ZITHROMAX) 500 mg in sodium chloride 0.9 % 250 mL IVPB        500 mg 250 mL/hr over 60 Minutes Intravenous Every 24 hours 09/08/21 1945     09/08/21 1800  levofloxacin (LEVAQUIN) IVPB 750 mg  Status:  Discontinued        750 mg 100 mL/hr over 90 Minutes Intravenous Every 48 hours 09/07/21 2013 09/08/21 1008   09/08/21 1800  vancomycin (VANCOREADY) IVPB 750 mg/150 mL  Status:  Discontinued        750 mg 150 mL/hr over 60 Minutes Intravenous Every 24 hours 09/08/21  0842 09/08/21 1518   09/08/21 0200  ceFEPIme (MAXIPIME) 2 g in sodium chloride 0.9 % 100 mL IVPB        2 g 200 mL/hr over 30 Minutes Intravenous Every 12 hours 09/07/21 2058     09/07/21 2200  cefTRIAXone (ROCEPHIN) 2 g in sodium chloride 0.9 % 100 mL IVPB  Status:  Discontinued        2 g 200 mL/hr over 30 Minutes Intravenous Every 24 hours 09/07/21 1816 09/07/21 2012   09/07/21 1830  azithromycin (ZITHROMAX) tablet 500 mg  Status:  Discontinued        500 mg Oral Daily 09/07/21 1816 09/07/21 2012   09/07/21 1700  ceFEPIme (MAXIPIME) 1 g in sodium chloride 0.9 % 100 mL IVPB        1 g 200 mL/hr over 30 Minutes Intravenous  Once 09/07/21 1616 09/07/21 1725   09/07/21 1700  vancomycin (VANCOREADY) IVPB 1500 mg/300 mL        1,500 mg 150 mL/hr over 120 Minutes Intravenous  Once 09/07/21 1625 09/07/21 1934       Subjective: Carrie Best  good oxygen saturation on 2 L; continue experiencing intermittent coughing spells.  No using accessory muscle.  Currently afebrile.  Chronically ill in appearance, generally weak requiring significant assistance for transferring out of bed.   Objective: Vitals:   09/10/21 1350 09/10/21 2241 09/11/21 0500 09/11/21 1429  BP: 133/78 (!) 132/53 130/66 105/62  Pulse: 72 62 65 64  Resp: _0 Temp: (!) 97.3 F (36.3 C) 98.5 F (36.9 C) 98.6 F (37 C) 98.3 F (36.8 C)  TempSrc: Oral Oral Oral Oral  SpO2: 97% 97% 98% 93%  Weight:      Height:        Intake/Output Summary (Last 24 hours) at 09/11/2021 1735 Last data filed at 09/11/2021 1400 Gross per 24 hour  Intake 960 ml  Output 1000 ml  Net -40 ml   Filed Weights   09/07/21 2200  Weight: 70.4 kg    Physical Exam General exam: Alert, awake, oriented x 2, able to sit on the chair with assistance, no fever, no nausea, no vomiting.  Tolerating diet and with good saturation on 2 L supplementation. Respiratory system: Positive rhonchi; no wheezing on exam.  No using accessory  muscle. Cardiovascular system: Rate controlled, no rubs, no gallops, no JVD. Gastrointestinal system: Abdomen is nondistended, soft and nontender. No organomegaly or masses felt. Normal bowel sounds heard. Central nervous system: No focal neurological deficits.  Generally weak and deconditioned. Extremities: No cyanosis or clubbing. Skin: No petechiae. Psychiatry: Judgement and insight appear normal. Mood & affect appropriate.   Data Reviewed: I have personally reviewed following labs and imaging studies  CBC: Recent Labs  Lab 09/07/21 1536 09/08/21 0544 09/09/21 0647  WBC 14.6* 11.1* 11.7*  NEUTROABS 12.7*  --   --   HGB 12.1 10.7* 11.3*  HCT 35.4* 31.5* 33.4*  MCV 92.7 93.5 93.0  PLT 178 150 759   Basic Metabolic Panel: Recent Labs  Lab 09/07/21 1536 09/08/21 0544 09/09/21 0641 09/09/21 0647  NA 130* 132* 131*  --   K 3.5 3.1* 4.7  --   CL 89* 91* 98  --   CO2 32 33* 24  --   GLUCOSE 246* 139* 113*  --   BUN 20 22 25*  --   CREATININE 1.01* 1.08* 1.04*  --   CALCIUM 8.8* 8.8* 8.5*  --   MG  --   --   --  2.1  PHOS  --   --  2.6  --    GFR: Estimated Creatinine Clearance: 35.5 mL/min (A) (by C-G formula based on SCr of 1.04 mg/dL (H)).  Liver Function Tests: Recent Labs  Lab 09/07/21 1536 09/09/21 0641  AST 20  --   ALT 17  --   ALKPHOS 104  --   BILITOT 1.0  --   PROT 5.7*  --   ALBUMIN 2.8* 2.6*   CBG: Recent Labs  Lab 09/10/21 1629 09/10/21 2239 09/11/21 0816 09/11/21 1142 09/11/21 1707  GLUCAP 143* 160* 111* 85 249*   Urine analysis:    Component Value Date/Time   COLORURINE YELLOW 08/27/2021 [place] 08/27/2021 1412   LABSPEC 1.015 08/27/2021 1412   PHURINE 6.5 08/27/2021 [place] 08/27/2021 1412   [place] 08/27/2021 [place] 08/27/2021 1412   [place] 08/27/2021 1412   PROTEINUR NEGATIVE 08/27/2021 1412   UROBILINOGEN 0.2 02/07/2015 1534   NITRITE NEGATIVE  08/27/2021 [place] 08/27/2021 1412  Sepsis Labs:  Recent Results (from the past 240 hour(s))  Blood culture (routine x 2)     Status: None (Preliminary result)   Collection Time: 09/07/21  4:19 PM   Specimen: Right Antecubital; Blood  Result Value Ref Range Status   Specimen Description RIGHT ANTECUBITAL  Final   Special Requests   Final    BOTTLES DRAWN AEROBIC AND ANAEROBIC Blood Culture adequate volume   Culture   Final    NO GROWTH 4 DAYS Performed at [facility], [address], [place], [place] [address]    Report Status PENDING  Incomplete  Blood culture (routine x 2)     Status: None (Preliminary result)   Collection Time: 09/07/21  4:19 PM   Specimen: Left Antecubital; Blood  Result Value Ref Range Status   Specimen Description LEFT ANTECUBITAL  Final   Special Requests   Final    BOTTLES DRAWN AEROBIC AND ANAEROBIC Blood Culture adequate volume   Culture   Final    NO GROWTH 4 DAYS Performed at [facility], [address], [place], [place] [address]    Report Status PENDING  Incomplete  Resp Panel by RT-PCR (Flu A&B, Covid) Nasopharyngeal Swab     Status: Abnormal   Collection Time: 09/07/21  6:27 PM   Specimen: Nasopharyngeal Swab; Nasopharyngeal(NP) swabs in vial transport medium  Result Value Ref Range Status   SARS Coronavirus 2 by RT PCR POSITIVE (A) NEGATIVE Final    Comment: (NOTE) SARS-CoV-2 target nucleic acids are DETECTED.  The SARS-CoV-2 RNA is generally detectable in upper respiratory specimens during the acute phase of infection. Positive results are indicative of the presence of the identified virus, but do not rule out bacterial infection or co-infection with other pathogens not detected by the test. Clinical correlation with patient history and other diagnostic information is necessary to determine patient infection status. The expected result is Negative.  Fact Sheet for  Patients: EntrepreneurPulse.com.au  Fact Sheet for Healthcare Providers: IncredibleEmployment.be  This test is not yet approved or cleared by the [place] FDA and  has been authorized for detection and/or diagnosis of SARS-CoV-2 by FDA under an Emergency Use Authorization (EUA).  This EUA will remain in effect (meaning this test can be used) for the duration of  the COVID-19 declaration under Section 564(b)(1) of the A ct, 21 U.S.C. section 360bbb-3(b)(1), unless the authorization is terminated or revoked sooner.     Influenza A by PCR NEGATIVE NEGATIVE Final   Influenza B by PCR NEGATIVE NEGATIVE Final    Comment: (NOTE) The Xpert Xpress SARS-CoV-2/FLU/RSV plus assay is intended as an aid in the diagnosis of influenza from Nasopharyngeal swab specimens and should not be used as a sole basis for treatment. Nasal washings and aspirates are unacceptable for Xpert Xpress SARS-CoV-2/FLU/RSV testing.  Fact Sheet for Patients: EntrepreneurPulse.com.au  Fact Sheet for Healthcare Providers: IncredibleEmployment.be  This test is not yet approved or cleared by the [place] FDA and has been authorized for detection and/or diagnosis of SARS-CoV-2 by FDA under an Emergency Use Authorization (EUA). This EUA will remain in effect (meaning this test can be used) for the duration of the COVID-19 declaration under Section 564(b)(1) of the Act, 21 U.S.C. section 360bbb-3(b)(1), unless the authorization is terminated or revoked.  Performed at [facility], [address] [place], [place] [address]   MRSA Next Gen by PCR, Nasal     Status: None   Collection Time: 09/08/21 12:24 PM   Specimen: Nasal Mucosa; Nasal  Swab  Result Value Ref Range Status   MRSA by PCR Next Gen NOT DETECTED NOT DETECTED Final    Comment: (NOTE) The GeneXpert MRSA Assay (FDA approved for NASAL specimens only), is one component of a  comprehensive MRSA colonization surveillance program. It is not intended to diagnose MRSA infection nor to guide or monitor treatment for MRSA infections. Test performance is not FDA approved in patients less than 68 years old. Performed at [facility], [address] [place], [place] [address]      Radiology Studies: No results found.   Scheduled Meds:  apixaban  5 mg Oral BID   ascorbic acid  500 mg Oral Daily   atorvastatin  40 mg Oral Daily   diltiazem  180 mg Oral Daily   DULoxetine  60 mg Oral Daily   insulin aspart  0-15 Units Subcutaneous TID WC   insulin aspart  0-5 Units Subcutaneous QHS   insulin aspart  3 Units Subcutaneous TID WC   insulin detemir  7 Units Subcutaneous Daily   isosorbide mononitrate  30 mg Oral Daily   metoprolol tartrate  50 mg Oral BID   polyethylene glycol  17 g Oral Daily   potassium chloride SA  20 mEq Oral Daily   zinc sulfate  220 mg Oral Daily   Continuous Infusions:  azithromycin Stopped (09/10/21 2139)   ceFEPime (MAXIPIME) IV 2 g (09/11/21 1445)     LOS: 4 days    Barton Dubois M.D on 09/11/2021 at 5:35 PM  Go to [email or web address] - for contact info  [facility] - Office  [phone]  If 7PM-7AM, please contact night-coverage [email or web address] Password [email or web address] 09/11/2021, 5:35 PM

## 2021-09-11 NOTE — Plan of Care (Signed)
°  Problem: Acute Rehab PT Goals(only PT should resolve) Goal: Pt Will Go Supine/Side To Sit Outcome: Progressing Flowsheets (Taken 09/11/2021 1541) Pt will go Supine/Side to Sit:  with modified independence  with supervision Goal: Patient Will Perform Sitting Balance Outcome: Progressing Flowsheets (Taken 09/11/2021 1541) Patient will perform sitting balance: with modified independence Goal: Patient Will Transfer Sit To/From Stand Outcome: Progressing Flowsheets (Taken 09/11/2021 1541) Patient will transfer sit to/from stand:  with moderate assist  with minimal assist Goal: Pt Will Transfer Bed To Chair/Chair To Bed Outcome: Progressing Flowsheets (Taken 09/11/2021 1541) Pt will Transfer Bed to Chair/Chair to Bed: with mod assist   3:41 PM, 09/11/21 Ocie Bob, MPT Physical Therapist with [facility] [phone] office [phone] mobile phone

## 2021-09-11 NOTE — Plan of Care (Signed)

## 2021-09-11 NOTE — Progress Notes (Signed)
Patient pleasant, vitals stable. She did not eat very much of her breakfast. There is considerable language barrier due to her hearing. Attempts to write information to patient better received, but still some limitations due to sight. She appears comfortable and states "im okay".

## 2021-09-11 NOTE — TOC Progression Note (Signed)
Transition of Care [facility]) - Progression Note    Patient Details  Name: DANAJAH BIRDSELL MRN: [id number] Date of Birth: 1928/03/10  [facility]) CM/SW Contact  Annice Needy, LCSW Phone Number: 09/11/2021, 2:04 PM  Clinical Narrative:    Spoke with [name], daughter-in-law, patient will go to a new facility in [place], [facility]. Facility does not open until Monday and will begin taking patients on Monday. Reiterated that if patient is medically stable for d/c prior to Monday, that she would not be able to board in the hospital until Monday and that she would be discharge home. [name] wants patient to be able to stay in the hospital until Monday reiterated again that once patient was medically stable for discharge, she would be discharged and could not board in the hospital until Monday if stable prior to Monday. [name] advised that the Clinical Manager was coming to the hospital to assess patient today.    Expected Discharge Plan: Skilled Nursing Facility Barriers to Discharge: Other (must enter comment) (Family working on placement plan)  Expected Discharge Plan and Services Expected Discharge Plan: Skilled Nursing Facility In-house Referral: Clinical Social Work     Living arrangements for the past 2 months: Skilled Nursing Facility                                       Social Determinants of Health (SDOH) Interventions    Readmission Risk Interventions Readmission Risk Prevention Plan 09/09/2021 09/01/2021  Transportation Screening Complete Complete  PCP or Specialist Appt within 5-7 Days - Complete  Home Care Screening - Complete  Medication Review (RN CM) - Complete  HRI or Home Care Consult Complete -  Social Work Consult for Recovery Care Planning/Counseling Complete -  Palliative Care Screening Not Applicable -  Medication Review Oceanographer) Complete -  Some recent data might be hidden

## 2021-09-11 NOTE — Progress Notes (Signed)
Patient family at bedside with clinical manager of another facility. Spoke with Dr. Gwenlyn Perking about multiple requests and questions from them. Asked if he would come speak to them.

## 2021-09-11 NOTE — Evaluation (Signed)
Physical Therapy Evaluation Patient Details Name: Carrie Best MRN: [id number] DOB: Oct 30, 1927 Today's Date: 09/11/2021  History of Present Illness  Presented via EMS from SNF for evaluation of shortness of breath, altered mental status/increased tiredness, hypoxia as low as 77% on 3 L nasal cannula at SNF.  HPI obtained from granddaughter at bedside, ED provider, chart review.  Patient was difficult to wake up this morning at SNF, low appetite did not eat much of her breakfast, reporting increased thirst.  Admission for COVID illness and was discharged to SNF for rehab.  Currently in ED, patient is arousable but not answering questions appropriately, not participating in interview.  At one point wakes up and asks granddaughter "where is everybody?"  But does not answer questions directed at her.   Clinical Impression  Patient demonstrates good return for moving BLE during supine to sitting with HOB raised, poor tolerance for standing on RLE due to c/o increasing right knee pain and unable to take steps using RW.  Patient required left knee blocked and Max assist to complete stand pivot transfer to chair.  Patient tolerated sitting up in chair after therapy with family member present in room after therapy - RN aware.  Patient will benefit from continued skilled physical therapy in hospital and recommended venue below to increase strength, balance, endurance for safe ADLs and gait.         Recommendations for follow up therapy are one component of a multi-disciplinary discharge planning process, led by the attending physician.  Recommendations may be updated based on patient status, additional functional criteria and insurance authorization.  Follow Up Recommendations Skilled nursing-short term rehab (<3 hours/day)    Assistance Recommended at Discharge Intermittent Supervision/Assistance  Patient can return home with the following  A lot of help with walking and/or transfers;A lot of help with  bathing/dressing/bathroom;Assistance with cooking/housework    Equipment Recommendations None recommended by PT  Recommendations for Other Services       Functional Status Assessment Patient has had a recent decline in their functional status and demonstrates the ability to make significant improvements in function in a reasonable and predictable amount of time.     Precautions / Restrictions Precautions Precautions: Fall Restrictions Weight Bearing Restrictions: No      Mobility  Bed Mobility Overal bed mobility: Needs Assistance Bed Mobility: Supine to Sit     Supine to sit: Min guard;HOB elevated     General bed mobility comments: demonstrates good return for moving BLE during supine to sitting with HOB raised    Transfers Overall transfer level: Needs assistance Equipment used: Rolling walker (2 wheels);1 person hand held assist Transfers: Sit to/from Stand;Bed to chair/wheelchair/BSC Sit to Stand: Mod assist Stand pivot transfers: Max assist         General transfer comment: had diffiuclty due to c/o increased right knee pain, required left knee blocked during stand pivot transfer    Ambulation/Gait                  Stairs            Wheelchair Mobility    Modified Rankin (Stroke Patients Only)       Balance Overall balance assessment: Needs assistance Sitting-balance support: Feet supported;No upper extremity supported Sitting balance-Leahy Scale: Fair Sitting balance - Comments: fair/good seated at EOB   Standing balance support: Reliant on assistive device for balance;During functional activity;Bilateral upper extremity supported Standing balance-Leahy Scale: Poor Standing balance comment: using RW  Pertinent Vitals/Pain Pain Assessment: Faces Faces Pain Scale: Hurts little more Pain Location: right knee when standing Pain Descriptors / Indicators: Sore;Grimacing;Guarding Pain  Intervention(s): Limited activity within patient's tolerance;Monitored during session;Repositioned    Home Living Family/patient expects to be discharged to:: Private residence Living Arrangements: Children Available Help at Discharge: Family;Available 24 hours/day Type of Home: House Home Access: Stairs to enter;Ramped entrance       Home Layout: Two level;Able to live on main level with bedroom/bathroom;Full bath on main level Home Equipment: Pharmacist, hospitalhower seat;Rolling Walker (2 wheels);BSC/3in1;Grab bars - tub/shower;Grab bars - toilet;Transport chair Additional Comments: used legs to propel transport chair in home    Prior Function Prior Level of Function : Needs assist       Physical Assist : Mobility (physical) Mobility (physical): Transfers;Gait;Stairs;Bed mobility   Mobility Comments: Assisted transfers, non-ambulatory due to chronic right knee pain ADLs Comments: assisted by family     Hand Dominance   Dominant Hand: Right    Extremity/Trunk Assessment   Upper Extremity Assessment Upper Extremity Assessment: Generalized weakness    Lower Extremity Assessment Lower Extremity Assessment: Generalized weakness;RLE deficits/detail RLE Deficits / Details: grossly -3/5 RLE: Unable to fully assess due to pain RLE Sensation: WNL RLE Coordination: WNL    Cervical / Trunk Assessment Cervical / Trunk Assessment: Kyphotic  Communication   Communication: HOH  Cognition Arousal/Alertness: Awake/alert Behavior During Therapy: WFL for tasks assessed/performed Overall Cognitive Status: History of cognitive impairments - at baseline                                          General Comments      Exercises     Assessment/Plan    PT Assessment Patient needs continued PT services  PT Problem List Decreased strength;Decreased activity tolerance;Decreased balance;Decreased mobility;Pain       PT Treatment Interventions DME instruction;Functional mobility  training;Therapeutic activities;Therapeutic exercise;Wheelchair mobility training;Balance training    PT Goals (Current goals can be found in the Care Plan section)  Acute Rehab PT Goals Patient Stated Goal: Rehab and then maybe back home. PT Goal Formulation: With patient/family Time For Goal Achievement: 09/25/21 Potential to Achieve Goals: Good    Frequency Min 3X/week     Co-evaluation               AM-PAC PT "6 Clicks" Mobility  Outcome Measure Help needed turning from your back to your side while in a flat bed without using bedrails?: A Little Help needed moving from lying on your back to sitting on the side of a flat bed without using bedrails?: A Little Help needed moving to and from a bed to a chair (including a wheelchair)?: A Lot Help needed standing up from a chair using your arms (e.g., wheelchair or bedside chair)?: A Lot Help needed to walk in hospital room?: Total Help needed climbing 3-5 steps with a railing? : Total 6 Click Score: 12    End of Session   Activity Tolerance: Patient tolerated treatment well;Patient limited by fatigue Patient left: in chair;with call bell/phone within reach;with family/visitor present Nurse Communication: Mobility status PT Visit Diagnosis: Unsteadiness on feet (R26.81);Muscle weakness (generalized) (M62.81);Other abnormalities of gait and mobility (R26.89);Difficulty in walking, not elsewhere classified (R26.2)    Time: [id number] PT Time Calculation (min) (ACUTE ONLY): 22 min   Charges:   PT Evaluation $PT Eval Moderate Complexity: 1 Mod PT Treatments $Therapeutic  Activity: 8-22 mins        3:39 PM, 09/11/21 Ocie Bob, MPT Physical Therapist with [facility] [phone] office [phone] mobile phone

## 2021-09-11 NOTE — TOC Progression Note (Addendum)
Transition of Care [facility]) - Progression Note    Patient Details  Name: Carrie Best MRN: [id number] Date of Birth: 1928/03/17  [facility] of [facility]) CM/SW Contact  Annice Needy, LCSW Phone Number: 09/11/2021, 3:51 PM  Clinical Narrative:    Per attending, patient is not safe to go home. [employer] auth started for SNF. If [facility] Harvey is provided patient will d/c to [facility] over weekend. If [facility] Harvey is not received she will admit to [facility] on Monday.  Elease Hashimoto with [facility] is prepared for patient to admit on Monday and has arranged PT and OT with [facility].  Patient has home oxygen.  Elease Hashimoto will need a hospital bed.   Expected Discharge Plan: Skilled Nursing Facility Barriers to Discharge: Other (must enter comment) (Family working on placement plan)  Expected Discharge Plan and Services Expected Discharge Plan: Skilled Nursing Facility In-house Referral: Clinical Social Work     Living arrangements for the past 2 months: Skilled Nursing Facility                                       Social Determinants of Health (SDOH) Interventions    Readmission Risk Interventions Readmission Risk Prevention Plan 09/09/2021 09/01/2021  Transportation Screening Complete Complete  PCP or Specialist Appt within 5-7 Days - Complete  Home Care Screening - Complete  Medication Review (RN CM) - Complete  HRI or Home Care Consult Complete -  Social Work Consult for Recovery Care Planning/Counseling Complete -  Palliative Care Screening Not Applicable -  Medication Review Oceanographer) Complete -  Some recent data might be hidden

## 2021-09-12 LAB — GLUCOSE, CAPILLARY
Glucose-Capillary: 84 mg/dL (ref 70–99)
Glucose-Capillary: 96 mg/dL (ref 70–99)
Glucose-Capillary: 294 mg/dL — ABNORMAL HIGH (ref 70–99)
Glucose-Capillary: 61 mg/dL — ABNORMAL LOW (ref 70–99)
Glucose-Capillary: 114 mg/dL — ABNORMAL HIGH (ref 70–99)

## 2021-09-12 LAB — CULTURE, BLOOD (ROUTINE X 2)
Culture: NO GROWTH
Culture: NO GROWTH
Special Requests: ADEQUATE
Special Requests: ADEQUATE

## 2021-09-12 MED ORDER — AZITHROMYCIN 250 MG PO TABS
250.0000 mg | ORAL_TABLET | Freq: Every day | ORAL | Status: AC
  Administered 2021-09-12: 250 mg via ORAL
  Filled 2021-09-12: qty 1

## 2021-09-12 NOTE — Progress Notes (Signed)
PROGRESS NOTE     Carrie Best, is a 88 y.o. female, DOB - February 14, 1928, RWE:[id number]  Admit date - 09/07/2021   Admitting Physician Emeterio Reeve, DO  Outpatient Primary MD for the patient is [name], MD  LOS - 5  Chief Complaint  Patient presents with   Shortness of Breath        Brief Narrative:  88 y.o. female.   has a past medical history of Anxiety, Arthritis, Congestive heart failure ([place]), Depression, Diastolic dysfunction, Dyspnea, Dysrhythmia (05-10-12), GERD (gastroesophageal reflux disease) (05-10-12), Hearing loss (05-10-12), Heart murmur, History of kidney stones, Hyperlipidemia, Hypertension, Pneumonia (05-10-12), Stroke ([place]) (05-10-12), and Wears hearing aid was admitted on 09/07/2021  with acute hypoxic respiratory failure in the setting of recent COVID-19 infection.  Assessment & Plan:   Principal Problem:   Acute hypoxemic respiratory failure (HCC) Active Problems:   Hospital acquired PNA   1) sepsis secondary to bilateral pneumonia--patient with recent COVID-19 infection -Met sepsis criteria on admission with leukocytosis, fevers, tachycardia, tachypnea and finding of pneumonia -Admission chest x-ray with bibasilar pneumonia and small left pleural effusion WBC 14.6 >> 11.7; continue to follow trend. -Lactic acid was greater than 2 at time of admission. -MRSA PCR negative; vancomycin has been discontinued. -Patient antibiotics transitioned to oral route and so far well tolerated.   -Follow clinical response and culture results.  2) acute hypoxic respiratory failure =--- Secondary to 1 above -Continue to attempt to wean off oxygen currently requiring 2 L of oxygen via nasal cannula  3)PAFIB- -continue apixaban for stroke prophylaxis -continue Cardizem and metoprolol for rate control -HR stable currently.  4)Covid 19--hx recent COVID + 08/27/21---  -Continue symptomatic and supportive care  -patient is vaccinated but not boosted -12/22>>09/01/21  received Remdesivir x 5 days.  5)HTN/HFpEF--- patient on chronic diastolic CHF, no evidence of acute CHF exacerbation at this time -Continue metoprolol, as well as isosorbide -Stop torsemide due to sepsis and concerns for dehydration. -Follow daily weights, strict intake and output.  6)CKD 3B -Overall stable and at baseline -Continue to renally adjust medications, avoid nephrotoxic agents / dehydration  / hypotension. -Continue to follow renal function trend.  7)Social/Ethics---  -patient is a DNR/DNI, readmitted from SNF. -Patient will require SNF at discharge for rehabilitation and conditioning.. -At this moment family is no looking for patient to return to same prior to admission SNF; patient is significantly weak and deconditioned; high risk for falls and inability to perform ADLs on her own at this moment.  Unfortunately family members unable to provide around-the-clock supervision and assistance for her. -New receiving skilled nursing facility has been peaked and then will have ability to receive patient on 09/14/2021 at discharge.  8)Dementia/Depression/Anxiety- -patient is not agitated, continue Cymbalta, use lorazepam as needed -Continue providing constant reorientation. -Mood is stable currently.  9)DM2-A1c 6.1 reflecting excellent diabetic control PTA, Use Novolog/Humalog Sliding scale insulin with Accu-Cheks/Fingersticks as ordered   10) hyponatremia/hypokalemia- -Continue holding torsemide for now. -Continue to follow electrolytes trends and further replete as needed.  Disposition/Need for in-Hospital Stay- patient is medically stable.  -Possible discharge back to SNF in 2 days (09/14/2021); now pending bed availability at new receiving SNF; new facility has been picked, expecting able to take patient on Monday for further care and rehab.  Status is: Inpatient  Remains inpatient appropriate because: As above  Disposition: The patient is from: SNF               Anticipated d/c is to: SNF  Anticipated d/c date is: medically stable.              Patient currently is not medically stable to d/c.  Barriers: Not Clinically Stable-   Code Status :  -  Code Status: DNR   Family Communication: No family at bedside.  Consults  :  Na  DVT Prophylaxis  :   - SCDs  apixaban (ELIQUIS) tablet 5 mg    Lab Results  Component Value Date   PLT 164 09/09/2021   Inpatient Medications  Scheduled Meds:  apixaban  5 mg Oral BID   ascorbic acid  500 mg Oral Daily   atorvastatin  40 mg Oral Daily   azithromycin  250 mg Oral q1800   cefdinir  300 mg Oral Q12H   diltiazem  180 mg Oral Daily   DULoxetine  60 mg Oral Daily   insulin aspart  0-15 Units Subcutaneous TID WC   insulin aspart  0-5 Units Subcutaneous QHS   insulin aspart  3 Units Subcutaneous TID WC   insulin detemir  7 Units Subcutaneous Daily   isosorbide mononitrate  30 mg Oral Daily   metoprolol tartrate  50 mg Oral BID   polyethylene glycol  17 g Oral Daily   potassium chloride SA  20 mEq Oral Daily   zinc sulfate  220 mg Oral Daily   Continuous Infusions:   PRN Meds:.guaiFENesin-dextromethorphan, LORazepam, traMADol   Anti-infectives (From admission, onward)    Start     Dose/Rate Route Frequency Ordered Stop   09/12/21 1800  azithromycin (ZITHROMAX) tablet 250 mg        250 mg Oral Daily-1800 09/12/21 0951 09/13/21 1759   09/11/21 2200  cefdinir (OMNICEF) capsule 300 mg        300 mg Oral Every 12 hours 09/11/21 1742     09/11/21 1800  azithromycin (ZITHROMAX) tablet 250 mg  Status:  Discontinued        250 mg Oral Daily-1800 09/11/21 1742 09/12/21 0951   09/09/21 0600  vancomycin (VANCOREADY) IVPB 1250 mg/250 mL  Status:  Discontinued        1,250 mg 166.7 mL/hr over 90 Minutes Intravenous Every 36 hours 09/07/21 1629 09/08/21 0842   09/08/21 2045  azithromycin (ZITHROMAX) 500 mg in sodium chloride 0.9 % 250 mL IVPB  Status:  Discontinued        500 mg 250 mL/hr  over 60 Minutes Intravenous Every 24 hours 09/08/21 1945 09/11/21 1742   09/08/21 1800  levofloxacin (LEVAQUIN) IVPB 750 mg  Status:  Discontinued        750 mg 100 mL/hr over 90 Minutes Intravenous Every 48 hours 09/07/21 2013 09/08/21 1008   09/08/21 1800  vancomycin (VANCOREADY) IVPB 750 mg/150 mL  Status:  Discontinued        750 mg 150 mL/hr over 60 Minutes Intravenous Every 24 hours 09/08/21 0842 09/08/21 1518   09/08/21 0200  ceFEPIme (MAXIPIME) 2 g in sodium chloride 0.9 % 100 mL IVPB  Status:  Discontinued        2 g 200 mL/hr over 30 Minutes Intravenous Every 12 hours 09/07/21 2058 09/11/21 1742   09/07/21 2200  cefTRIAXone (ROCEPHIN) 2 g in sodium chloride 0.9 % 100 mL IVPB  Status:  Discontinued        2 g 200 mL/hr over 30 Minutes Intravenous Every 24 hours 09/07/21 1816 09/07/21 2012   09/07/21 1830  azithromycin (ZITHROMAX) tablet 500 mg  Status:  Discontinued  500 mg Oral Daily 09/07/21 1816 09/07/21 2012   09/07/21 1700  ceFEPIme (MAXIPIME) 1 g in sodium chloride 0.9 % 100 mL IVPB        1 g 200 mL/hr over 30 Minutes Intravenous  Once 09/07/21 1616 09/07/21 1725   09/07/21 1700  vancomycin (VANCOREADY) IVPB 1500 mg/300 mL        1,500 mg 150 mL/hr over 120 Minutes Intravenous  Once 09/07/21 1625 09/07/21 1934       Subjective: Carrie Best good oxygen saturation on 2L; no CP, no nausea, no vomiting. Patient with intermittent coughing spells and demonstrating generalized weakness and deconditioning.    Objective: Vitals:   09/11/21 1429 09/11/21 2303 09/12/21 0505 09/12/21 0909  BP: 105/62 (!) 155/70 136/80 (!) 154/69  Pulse: 64 70 (!) 56 66  Resp: 20 16    Temp: 98.3 F (36.8 C) 98.1 F (36.7 C) 98.6 F (37 C)   TempSrc: Oral     SpO2: 93% 98% 96%   Weight:      Height:        Intake/Output Summary (Last 24 hours) at 09/12/2021 1023 Last data filed at 09/12/2021 0500 Gross per 24 hour  Intake 840 ml  Output 800 ml  Net 40 ml   Filed Weights    09/07/21 2200  Weight: 70.4 kg    Physical Exam General exam: Alert, awake, oriented x 2, following commands appropriately and in no major distress. Using 2L [place]. Respiratory system: improved air movement, no wheezing, no crackles, positive rhonchi bilaterally.  Cardiovascular system: RRR. No murmurs, rubs, gallops. No JVD Gastrointestinal system: Abdomen is nondistended, soft and nontender. No organomegaly or masses felt. Normal bowel sounds heard. Central nervous system: No focal neurological deficits. Extremities: No cyanosis, no clubbing. Skin: No petechiae. Psychiatry: Mood & affect appropriate.    Data Reviewed: I have personally reviewed following labs and imaging studies  CBC: Recent Labs  Lab 09/07/21 1536 09/08/21 0544 09/09/21 0647  WBC 14.6* 11.1* 11.7*  NEUTROABS 12.7*  --   --   HGB 12.1 10.7* 11.3*  HCT 35.4* 31.5* 33.4*  MCV 92.7 93.5 93.0  PLT 178 150 481   Basic Metabolic Panel: Recent Labs  Lab 09/07/21 1536 09/08/21 0544 09/09/21 0641 09/09/21 0647  NA 130* 132* 131*  --   K 3.5 3.1* 4.7  --   CL 89* 91* 98  --   CO2 32 33* 24  --   GLUCOSE 246* 139* 113*  --   BUN 20 22 25*  --   CREATININE 1.01* 1.08* 1.04*  --   CALCIUM 8.8* 8.8* 8.5*  --   MG  --   --   --  2.1  PHOS  --   --  2.6  --    GFR: Estimated Creatinine Clearance: 35.5 mL/min (A) (by C-G formula based on SCr of 1.04 mg/dL (H)).  Liver Function Tests: Recent Labs  Lab 09/07/21 1536 09/09/21 0641  AST 20  --   ALT 17  --   ALKPHOS 104  --   BILITOT 1.0  --   PROT 5.7*  --   ALBUMIN 2.8* 2.6*   CBG: Recent Labs  Lab 09/11/21 0816 09/11/21 1142 09/11/21 1707 09/11/21 2218 09/12/21 0750  GLUCAP 111* 85 249* 120* 84   Urine analysis:    Component Value Date/Time   COLORURINE YELLOW 08/27/2021 [place] 08/27/2021 1412   LABSPEC 1.015 08/27/2021 1412   PHURINE 6.5 08/27/2021 1412  GLUCOSEU NEGATIVE 08/27/2021 1412   HGBUR NEGATIVE 08/27/2021  1412   [place] 08/27/2021 [facility] 08/27/2021 1412   PROTEINUR NEGATIVE 08/27/2021 1412   UROBILINOGEN 0.2 02/07/2015 1534   NITRITE NEGATIVE 08/27/2021 1412   LEUKOCYTESUR NEGATIVE 08/27/2021 1412   Sepsis Labs:  Recent Results (from the past 240 hour(s))  Blood culture (routine x 2)     Status: None   Collection Time: 09/07/21  4:19 PM   Specimen: Right Antecubital; Blood  Result Value Ref Range Status   Specimen Description RIGHT ANTECUBITAL  Final   Special Requests   Final    BOTTLES DRAWN AEROBIC AND ANAEROBIC Blood Culture adequate volume   Culture   Final    NO GROWTH 5 DAYS Performed at [facility], [address] [place], [place] [address]    Report Status 09/12/2021 FINAL  Final  Blood culture (routine x 2)     Status: None   Collection Time: 09/07/21  4:19 PM   Specimen: Left Antecubital; Blood  Result Value Ref Range Status   Specimen Description LEFT ANTECUBITAL  Final   Special Requests   Final    BOTTLES DRAWN AEROBIC AND ANAEROBIC Blood Culture adequate volume   Culture   Final    NO GROWTH 5 DAYS Performed at [facility], [address], [place],  [address]    Report Status 09/12/2021 FINAL  Final  Resp Panel by RT-PCR (Flu A&B, Covid) Nasopharyngeal Swab     Status: Abnormal   Collection Time: 09/07/21  6:27 PM   Specimen: Nasopharyngeal Swab; Nasopharyngeal(NP) swabs in vial transport medium  Result Value Ref Range Status   SARS Coronavirus 2 by RT PCR POSITIVE (A) NEGATIVE Final    Comment: (NOTE) SARS-CoV-2 target nucleic acids are DETECTED.  The SARS-CoV-2 RNA is generally detectable in upper respiratory specimens during the acute phase of infection. Positive results are indicative of the presence of the identified virus, but do not rule out bacterial infection or co-infection with other pathogens not detected by the test. Clinical correlation with patient history and other diagnostic information is necessary  to determine patient infection status. The expected result is Negative.  Fact Sheet for Patients: EntrepreneurPulse.com.au  Fact Sheet for Healthcare Providers: IncredibleEmployment.be  This test is not yet approved or cleared by the [place] FDA and  has been authorized for detection and/or diagnosis of SARS-CoV-2 by FDA under an Emergency Use Authorization (EUA).  This EUA will remain in effect (meaning this test can be used) for the duration of  the COVID-19 declaration under Section 564(b)(1) of the A ct, 21 U.S.C. section 360bbb-3(b)(1), unless the authorization is terminated or revoked sooner.     Influenza A by PCR NEGATIVE NEGATIVE Final   Influenza B by PCR NEGATIVE NEGATIVE Final    Comment: (NOTE) The Xpert Xpress SARS-CoV-2/FLU/RSV plus assay is intended as an aid in the diagnosis of influenza from Nasopharyngeal swab specimens and should not be used as a sole basis for treatment. Nasal washings and aspirates are unacceptable for Xpert Xpress SARS-CoV-2/FLU/RSV testing.  Fact Sheet for Patients: EntrepreneurPulse.com.au  Fact Sheet for Healthcare Providers: IncredibleEmployment.be  This test is not yet approved or cleared by the [place] FDA and has been authorized for detection and/or diagnosis of SARS-CoV-2 by FDA under an Emergency Use Authorization (EUA). This EUA will remain in effect (meaning this test can be used) for the duration of the COVID-19 declaration under Section 564(b)(1) of the Act, 21 U.S.C. section  360bbb-3(b)(1), unless the authorization is terminated or revoked.  Performed at [facility], [address] [place], [place] [address]   MRSA Next Gen by PCR, Nasal     Status: None   Collection Time: 09/08/21 12:24 PM   Specimen: Nasal Mucosa; Nasal Swab  Result Value Ref Range Status   MRSA by PCR Next Gen NOT DETECTED NOT DETECTED Final    Comment: (NOTE) The  GeneXpert MRSA Assay (FDA approved for NASAL specimens only), is one component of a comprehensive MRSA colonization surveillance program. It is not intended to diagnose MRSA infection nor to guide or monitor treatment for MRSA infections. Test performance is not FDA approved in patients less than 36 years old. Performed at [facility], [address] [place],  [address]      Radiology Studies: No results found.   Scheduled Meds:  apixaban  5 mg Oral BID   ascorbic acid  500 mg Oral Daily   atorvastatin  40 mg Oral Daily   azithromycin  250 mg Oral q1800   cefdinir  300 mg Oral Q12H   diltiazem  180 mg Oral Daily   DULoxetine  60 mg Oral Daily   insulin aspart  0-15 Units Subcutaneous TID WC   insulin aspart  0-5 Units Subcutaneous QHS   insulin aspart  3 Units Subcutaneous TID WC   insulin detemir  7 Units Subcutaneous Daily   isosorbide mononitrate  30 mg Oral Daily   metoprolol tartrate  50 mg Oral BID   polyethylene glycol  17 g Oral Daily   potassium chloride SA  20 mEq Oral Daily   zinc sulfate  220 mg Oral Daily   Continuous Infusions:     LOS: 5 days    Barton Dubois M.D on 09/12/2021 at 10:23 AM  Go to [email or web address] - for contact info  [facility] - Office  [phone]  If 7PM-7AM, please contact night-coverage [email or web address] Password [facility] 09/12/2021, 10:23 AM

## 2021-09-12 NOTE — Plan of Care (Signed)
°  Problem: Clinical Measurements: Goal: Will remain free from infection Outcome: Progressing   Problem: Education: Goal: Knowledge of risk factors and measures for prevention of condition will improve Outcome: Progressing

## 2021-09-13 LAB — GLUCOSE, CAPILLARY
Glucose-Capillary: 126 mg/dL — ABNORMAL HIGH (ref 70–99)
Glucose-Capillary: 118 mg/dL — ABNORMAL HIGH (ref 70–99)
Glucose-Capillary: 262 mg/dL — ABNORMAL HIGH (ref 70–99)
Glucose-Capillary: 113 mg/dL — ABNORMAL HIGH (ref 70–99)

## 2021-09-13 NOTE — Plan of Care (Signed)
  Problem: Education: Goal: Knowledge of General Education information will improve Description: Including pain rating scale, medication(s)/side effects and non-pharmacologic comfort measures Outcome: Progressing   Problem: Clinical Measurements: Goal: Ability to maintain clinical measurements within normal limits will improve Outcome: Progressing Goal: Will remain free from infection Outcome: Progressing   

## 2021-09-13 NOTE — Progress Notes (Signed)
PROGRESS NOTE     Carrie Best, is a 88 y.o. female, DOB - 1928/05/13, ZOX:[id number]  Admit date - 09/07/2021   Admitting Physician Emeterio Reeve, DO  Outpatient Primary MD for the patient is [name], MD  LOS - 6  Chief Complaint  Patient presents with   Shortness of Breath        Brief Narrative:  88 y.o. female.   has a past medical history of Anxiety, Arthritis, Congestive heart failure ([place]), Depression, Diastolic dysfunction, Dyspnea, Dysrhythmia (05-10-12), GERD (gastroesophageal reflux disease) (05-10-12), Hearing loss (05-10-12), Heart murmur, History of kidney stones, Hyperlipidemia, Hypertension, Pneumonia (05-10-12), Stroke ([place]) (05-10-12), and Wears hearing aid was admitted on 09/07/2021  with acute hypoxic respiratory failure in the setting of recent COVID-19 infection.  Assessment & Plan:   Principal Problem:   Acute hypoxemic respiratory failure (HCC) Active Problems:   Hospital acquired PNA   1) sepsis secondary to bilateral pneumonia--patient with recent COVID-19 infection -Met sepsis criteria on admission with leukocytosis, fevers, tachycardia, tachypnea and finding of pneumonia -Admission chest x-ray with bibasilar pneumonia and small left pleural effusion WBC 14.6 >> 11.7; continue to follow trend intermittently. -Lactic acid was greater than 2 at time of admission. -MRSA PCR negative; vancomycin was appropriately discontinued. -Patient antibiotics transitioned to oral route and so far well tolerated.   -continue to follow clinical response and provide supportive care.  2) acute hypoxic respiratory failure =--- Secondary to 1 above -Continue to attempt to wean off oxygen currently requiring 2 L of oxygen via nasal cannula  3)PAFIB- -continue apixaban for stroke prophylaxis -continue Cardizem and metoprolol for rate control -HR stable currently.  4)Covid 19--hx recent COVID + 08/27/21---  -Continue symptomatic and supportive care  -patient is  vaccinated but not boosted -12/22>>09/01/21 patient received Remdesivir x 5 days.  5)HTN/HFpEF--- patient on chronic diastolic CHF, no evidence of acute CHF exacerbation at this time -Continue metoprolol, as well as isosorbide -Stop torsemide due to sepsis and concerns for dehydration. -Follow daily weights, strict intake and output.  6)CKD 3B -Overall stable and at baseline -Continue to renally adjust medications, avoid nephrotoxic agents / dehydration  / hypotension. -Continue to follow renal function trend.  7)Social/Ethics---  -patient is a DNR/DNI, readmitted from SNF. -Patient will require SNF at discharge for rehabilitation and conditioning.. -At this moment family is no looking for patient to return to same prior to admission SNF; patient is significantly weak and deconditioned; high risk for falls and inability to perform ADLs on her own at this moment.  Unfortunately family members unable to provide around-the-clock supervision and assistance for her. -New receiving skilled nursing facility has been peaked and then will have ability to receive patient on 09/14/2021 at discharge.  8)Dementia/Depression/Anxiety- -patient is not agitated, continue Cymbalta, use lorazepam as needed -Continue providing constant reorientation. -Mood is stable currently.  9)DM2-A1c 6.1 reflecting excellent diabetic control PTA, Use Novolog/Humalog Sliding scale insulin with Accu-Cheks/Fingersticks as ordered   10) hyponatremia/hypokalemia- -Continue holding torsemide for now. -Continue to follow electrolytes trends and further replete as needed.  Disposition/Need for in-Hospital Stay- patient is medically stable.  -Possible discharge back to SNF in 1 day (09/14/2021); now pending bed availability at new receiving SNF; new facility has been picked, expecting able to take patient on Monday 09/14/21 for further care and rehab.  Status is: Inpatient  Remains inpatient appropriate because: As  above  Disposition: The patient is from: SNF              Anticipated  d/c is to: SNF              Anticipated d/c date is: medically stable.              Patient currently is medically stable for discharge.  Barriers: Not Clinically Stable-   Code Status :  -  Code Status: DNR   Family Communication: No family at bedside.  Consults  :  Na  DVT Prophylaxis  :   - SCDs  apixaban (ELIQUIS) tablet 5 mg    Lab Results  Component Value Date   PLT 164 09/09/2021   Inpatient Medications  Scheduled Meds:  apixaban  5 mg Oral BID   ascorbic acid  500 mg Oral Daily   atorvastatin  40 mg Oral Daily   cefdinir  300 mg Oral Q12H   diltiazem  180 mg Oral Daily   DULoxetine  60 mg Oral Daily   insulin aspart  0-15 Units Subcutaneous TID WC   insulin aspart  0-5 Units Subcutaneous QHS   insulin aspart  3 Units Subcutaneous TID WC   insulin detemir  7 Units Subcutaneous Daily   isosorbide mononitrate  30 mg Oral Daily   metoprolol tartrate  50 mg Oral BID   polyethylene glycol  17 g Oral Daily   potassium chloride SA  20 mEq Oral Daily   zinc sulfate  220 mg Oral Daily   Continuous Infusions:   PRN Meds:.guaiFENesin-dextromethorphan, LORazepam, traMADol   Anti-infectives (From admission, onward)    Start     Dose/Rate Route Frequency Ordered Stop   09/12/21 1800  azithromycin (ZITHROMAX) tablet 250 mg        250 mg Oral Daily-1800 09/12/21 0951 09/12/21 1747   09/11/21 2200  cefdinir (OMNICEF) capsule 300 mg        300 mg Oral Every 12 hours 09/11/21 1742     09/11/21 1800  azithromycin (ZITHROMAX) tablet 250 mg  Status:  Discontinued        250 mg Oral Daily-1800 09/11/21 1742 09/12/21 0951   09/09/21 0600  vancomycin (VANCOREADY) IVPB 1250 mg/250 mL  Status:  Discontinued        1,250 mg 166.7 mL/hr over 90 Minutes Intravenous Every 36 hours 09/07/21 1629 09/08/21 0842   09/08/21 2045  azithromycin (ZITHROMAX) 500 mg in sodium chloride 0.9 % 250 mL IVPB  Status:   Discontinued        500 mg 250 mL/hr over 60 Minutes Intravenous Every 24 hours 09/08/21 1945 09/11/21 1742   09/08/21 1800  levofloxacin (LEVAQUIN) IVPB 750 mg  Status:  Discontinued        750 mg 100 mL/hr over 90 Minutes Intravenous Every 48 hours 09/07/21 2013 09/08/21 1008   09/08/21 1800  vancomycin (VANCOREADY) IVPB 750 mg/150 mL  Status:  Discontinued        750 mg 150 mL/hr over 60 Minutes Intravenous Every 24 hours 09/08/21 0842 09/08/21 1518   09/08/21 0200  ceFEPIme (MAXIPIME) 2 g in sodium chloride 0.9 % 100 mL IVPB  Status:  Discontinued        2 g 200 mL/hr over 30 Minutes Intravenous Every 12 hours 09/07/21 2058 09/11/21 1742   09/07/21 2200  cefTRIAXone (ROCEPHIN) 2 g in sodium chloride 0.9 % 100 mL IVPB  Status:  Discontinued        2 g 200 mL/hr over 30 Minutes Intravenous Every 24 hours 09/07/21 1816 09/07/21 2012   09/07/21 1830  azithromycin (ZITHROMAX) tablet 500  mg  Status:  Discontinued        500 mg Oral Daily 09/07/21 1816 09/07/21 2012   09/07/21 1700  ceFEPIme (MAXIPIME) 1 g in sodium chloride 0.9 % 100 mL IVPB        1 g 200 mL/hr over 30 Minutes Intravenous  Once 09/07/21 1616 09/07/21 1725   09/07/21 1700  vancomycin (VANCOREADY) IVPB 1500 mg/300 mL        1,500 mg 150 mL/hr over 120 Minutes Intravenous  Once 09/07/21 1625 09/07/21 1934       Subjective: [name] good saturation on 1-2 L nasal cannula supplementation; no chest pain, no nausea or vomiting.  Generally weak and deconditioned but in no major distress.  Reports improvement in her appetite.  No overnight events.   Objective: Vitals:   09/12/21 0909 09/12/21 1616 09/12/21 2127 09/13/21 0415  BP: (!) 154/69 (!) 130/57 (!) 125/59 (!) 147/67  Pulse: 66 78 71 62  Resp:  _0 Temp:  98.2 F (36.8 C) 97.6 F (36.4 C) 97.8 F (36.6 C)  TempSrc:  Oral Oral   SpO2:  98% 99% 96%  Weight:      Height:        Intake/Output Summary (Last 24 hours) at 09/13/2021 0806 Last data filed  at 09/13/2021 0700 Gross per 24 hour  Intake 440 ml  Output 1650 ml  Net -1210 ml   Filed Weights   09/07/21 2200  Weight: 70.4 kg    Physical Exam General exam: Alert, awake, oriented x 2; in no acute distress; reports breathing improved and stable currently.  No nausea or vomiting. Respiratory system: Improved air movement bilaterally; no wheezing, no crackles.  Positive rhonchi appreciated.  1-2 L nasal cannula in place. Cardiovascular system:RRR. No murmurs, rubs, gallops.  No JVD. Gastrointestinal system: Abdomen is nondistended, soft and nontender. No organomegaly or masses felt. Normal bowel sounds heard. Central nervous system: No focal neurological deficits.  Very hard of hearing. Extremities: No cyanosis or clubbing. Skin: No petechiae. Psychiatry: Appropriate mood and affect.   Data Reviewed: I have personally reviewed following labs and imaging studies  CBC: Recent Labs  Lab 09/07/21 1536 09/08/21 0544 09/09/21 0647  WBC 14.6* 11.1* 11.7*  NEUTROABS 12.7*  --   --   HGB 12.1 10.7* 11.3*  HCT 35.4* 31.5* 33.4*  MCV 92.7 93.5 93.0  PLT 178 150 865   Basic Metabolic Panel: Recent Labs  Lab 09/07/21 1536 09/08/21 0544 09/09/21 0641 09/09/21 0647  NA 130* 132* 131*  --   K 3.5 3.1* 4.7  --   CL 89* 91* 98  --   CO2 32 33* 24  --   GLUCOSE 246* 139* 113*  --   BUN 20 22 25*  --   CREATININE 1.01* 1.08* 1.04*  --   CALCIUM 8.8* 8.8* 8.5*  --   MG  --   --   --  2.1  PHOS  --   --  2.6  --    GFR: Estimated Creatinine Clearance: 35.5 mL/min (A) (by C-G formula based on SCr of 1.04 mg/dL (H)).  Liver Function Tests: Recent Labs  Lab 09/07/21 1536 09/09/21 0641  AST 20  --   ALT 17  --   ALKPHOS 104  --   BILITOT 1.0  --   PROT 5.7*  --   ALBUMIN 2.8* 2.6*   CBG: Recent Labs  Lab 09/12/21 1143 09/12/21 1613 09/12/21 2119 09/12/21 2153 09/13/21 7846  GLUCAP 96 294* 61* 114* 126*   Urine analysis:    Component Value Date/Time    COLORURINE YELLOW 08/27/2021 [place] 08/27/2021 1412   LABSPEC 1.015 08/27/2021 1412   PHURINE 6.5 08/27/2021 1412   GLUCOSEU NEGATIVE 08/27/2021 1412   HGBUR NEGATIVE 08/27/2021 1412   BILIRUBINUR NEGATIVE 08/27/2021 1412   KETONESUR NEGATIVE 08/27/2021 1412   PROTEINUR NEGATIVE 08/27/2021 1412   UROBILINOGEN 0.2 02/07/2015 1534   NITRITE NEGATIVE 08/27/2021 1412   LEUKOCYTESUR NEGATIVE 08/27/2021 1412   Sepsis Labs:  Recent Results (from the past 240 hour(s))  Blood culture (routine x 2)     Status: None   Collection Time: 09/07/21  4:19 PM   Specimen: Right Antecubital; Blood  Result Value Ref Range Status   Specimen Description RIGHT ANTECUBITAL  Final   Special Requests   Final    BOTTLES DRAWN AEROBIC AND ANAEROBIC Blood Culture adequate volume   Culture   Final    NO GROWTH 5 DAYS Performed at [facility], [address] [place] [address], [place], [place] [address]    Report Status 09/12/2021 FINAL  Final  Blood culture (routine x 2)     Status: None   Collection Time: 09/07/21  4:19 PM   Specimen: Left Antecubital; Blood  Result Value Ref Range Status   Specimen Description LEFT ANTECUBITAL  Final   Special Requests   Final    BOTTLES DRAWN AEROBIC AND ANAEROBIC Blood Culture adequate volume   Culture   Final    NO GROWTH 5 DAYS Performed at [facility], [address] [place], [place] [address]    Report Status 09/12/2021 FINAL  Final  Resp Panel by RT-PCR (Flu A&B, Covid) Nasopharyngeal Swab     Status: Abnormal   Collection Time: 09/07/21  6:27 PM   Specimen: Nasopharyngeal Swab; Nasopharyngeal(NP) swabs in vial transport medium  Result Value Ref Range Status   SARS Coronavirus 2 by RT PCR POSITIVE (A) NEGATIVE Final    Comment: (NOTE) SARS-CoV-2 target nucleic acids are DETECTED.  The SARS-CoV-2 RNA is generally detectable in upper respiratory specimens during the acute phase of infection. Positive results are indicative of the presence of the  identified virus, but do not rule out bacterial infection or co-infection with other pathogens not detected by the test. Clinical correlation with patient history and other diagnostic information is necessary to determine patient infection status. The expected result is Negative.  Fact Sheet for Patients: EntrepreneurPulse.com.au  Fact Sheet for Healthcare Providers: IncredibleEmployment.be  This test is not yet approved or cleared by the [place] FDA and  has been authorized for detection and/or diagnosis of SARS-CoV-2 by FDA under an Emergency Use Authorization (EUA).  This EUA will remain in effect (meaning this test can be used) for the duration of  the COVID-19 declaration under Section 564(b)(1) of the A ct, 21 U.S.C. section 360bbb-3(b)(1), unless the authorization is terminated or revoked sooner.     Influenza A by PCR NEGATIVE NEGATIVE Final   Influenza B by PCR NEGATIVE NEGATIVE Final    Comment: (NOTE) The Xpert Xpress SARS-CoV-2/FLU/RSV plus assay is intended as an aid in the diagnosis of influenza from Nasopharyngeal swab specimens and should not be used as a sole basis for treatment. Nasal washings and aspirates are unacceptable for Xpert Xpress SARS-CoV-2/FLU/RSV testing.  Fact Sheet for Patients: EntrepreneurPulse.com.au  Fact Sheet for Healthcare Providers: IncredibleEmployment.be  This test is not yet approved or cleared by the [place] and has been authorized  for detection and/or diagnosis of SARS-CoV-2 by FDA under an Emergency Use Authorization (EUA). This EUA will remain in effect (meaning this test can be used) for the duration of the COVID-19 declaration under Section 564(b)(1) of the Act, 21 U.S.C. section 360bbb-3(b)(1), unless the authorization is terminated or revoked.  Performed at [facility], [address] [place], [place] [address]   MRSA Next Gen by PCR,  Nasal     Status: None   Collection Time: 09/08/21 12:24 PM   Specimen: Nasal Mucosa; Nasal Swab  Result Value Ref Range Status   MRSA by PCR Next Gen NOT DETECTED NOT DETECTED Final    Comment: (NOTE) The GeneXpert MRSA Assay (FDA approved for NASAL specimens only), is one component of a comprehensive MRSA colonization surveillance program. It is not intended to diagnose MRSA infection nor to guide or monitor treatment for MRSA infections. Test performance is not FDA approved in patients less than 40 years old. Performed at [facility], [address] [place], [place] [address]      Radiology Studies: No results found.   Scheduled Meds:  apixaban  5 mg Oral BID   ascorbic acid  500 mg Oral Daily   atorvastatin  40 mg Oral Daily   cefdinir  300 mg Oral Q12H   diltiazem  180 mg Oral Daily   DULoxetine  60 mg Oral Daily   insulin aspart  0-15 Units Subcutaneous TID WC   insulin aspart  0-5 Units Subcutaneous QHS   insulin aspart  3 Units Subcutaneous TID WC   insulin detemir  7 Units Subcutaneous Daily   isosorbide mononitrate  30 mg Oral Daily   metoprolol tartrate  50 mg Oral BID   polyethylene glycol  17 g Oral Daily   potassium chloride SA  20 mEq Oral Daily   zinc sulfate  220 mg Oral Daily   Continuous Infusions:     LOS: 6 days    Barton Dubois M.D on 09/13/2021 at 8:06 AM  Go to [email or web address] - for contact info  [facility] - Office  [phone]  If 7PM-7AM, please contact night-coverage [email or web address] Password [email or web address] 09/13/2021, 8:06 AM

## 2021-09-14 DIAGNOSIS — R5381 Other malaise: Secondary | ICD-10-CM

## 2021-09-14 DIAGNOSIS — R7303 Prediabetes: Secondary | ICD-10-CM

## 2021-09-14 DIAGNOSIS — I482 Chronic atrial fibrillation, unspecified: Secondary | ICD-10-CM

## 2021-09-14 DIAGNOSIS — I10 Essential (primary) hypertension: Secondary | ICD-10-CM

## 2021-09-14 DIAGNOSIS — I5032 Chronic diastolic (congestive) heart failure: Secondary | ICD-10-CM

## 2021-09-14 LAB — GLUCOSE, CAPILLARY
Glucose-Capillary: 112 mg/dL — ABNORMAL HIGH (ref 70–99)
Glucose-Capillary: 157 mg/dL — ABNORMAL HIGH (ref 70–99)
Glucose-Capillary: 96 mg/dL (ref 70–99)
Glucose-Capillary: 108 mg/dL — ABNORMAL HIGH (ref 70–99)

## 2021-09-14 MED ORDER — CEFDINIR 300 MG PO CAPS: 300.0000 mg | ORAL_CAPSULE | Freq: Two times a day (BID) | ORAL | 0 refills | Status: AC

## 2021-09-14 MED ORDER — LORAZEPAM 0.5 MG PO TABS: 0.5000 mg | ORAL_TABLET | Freq: Three times a day (TID) | ORAL | 0 refills | Status: DC | PRN

## 2021-09-14 MED ORDER — TORSEMIDE 20 MG PO TABS: 20.0000 mg | ORAL_TABLET | ORAL | Status: AC

## 2021-09-14 MED ORDER — TRAMADOL HCL 50 MG PO TABS: 50.0000 mg | ORAL_TABLET | Freq: Two times a day (BID) | ORAL | 0 refills | Status: DC | PRN

## 2021-09-14 NOTE — Care Management Important Message (Signed)
Important Message  Patient Details  Name: Carrie Best MRN: [id number] Date of Birth: 1928-05-29   Medicare Important Message Given:  No (spoke with daughter [name] on 1/6 , copy mailed due to contact precautions)     Tommy Medal 09/14/2021, 3:34 PM

## 2021-09-14 NOTE — TOC Transition Note (Signed)
Transition of Care [facility]) - CM/SW Discharge Note   Patient Details  Name: Carrie Best MRN: [id number] Date of Birth: Jan 31, 1928  Transition of Care [facility] CM/SW Contact:  Annice Needy, LCSW Phone Number: 09/14/2021, 5:04 PM   Clinical Narrative:    D/c clinicals faxed to the facility. Facility to call RN once equipment has been delivered so that transport can be called.    Final next level of care: Assisted Living Barriers to Discharge: No Barriers Identified   Patient Goals and CMS Choice Patient states their goals for this hospitalization and ongoing recovery are:: anticipate return to SNF      Discharge Placement              Patient chooses bed at: Other - please specify in the comment section below: ([facility]) Patient to be transferred to facility by: [facility] Name of family member notified: Debbie, dil Patient and family notified of of transfer: 09/14/21  Discharge Plan and Services In-house Referral: Clinical Social Work                                   Social Determinants of Health (SDOH) Interventions     Readmission Risk Interventions Readmission Risk Prevention Plan 09/09/2021 09/01/2021  Transportation Screening Complete Complete  PCP or Specialist Appt within 5-7 Days - Complete  Home Care Screening - Complete  Medication Review (RN CM) - Complete  HRI or Home Care Consult Complete -  Social Work Consult for Recovery Care Planning/Counseling Complete -  Palliative Care Screening Not Applicable -  Medication Review Oceanographer) Complete -  Some recent data might be hidden

## 2021-09-14 NOTE — Progress Notes (Signed)
Patient requires frequent re-positioning of the body in ways that cannot be achieved with an ordinary bed or wedge pillow to eliminate pain and the head of the bed needs to be evaluated more than 30 degrees most of the time.    Traycen Goyer D, LCSW  

## 2021-09-14 NOTE — Discharge Summary (Addendum)
Physician Discharge Summary  Carrie Best:[id number] DOB: 1928-04-12 DOA: 09/07/2021  PCP: [name], MD  Admit date: 09/07/2021 Discharge date: 09/15/2021  Time spent: 35 minutes  Recommendations for Outpatient Follow-up:  Repeat basic metabolic panel to follow twice renal function. Reassessed volume status with further adjustment to diuretic regimen as required. Continue close monitoring of patient's CBGs with further adjustment to hypoglycemia regimen as needed. Outpatient follow-up with cardiology service as previously instructed.  Discharge Diagnoses:  Principal Problem:   Acute hypoxemic respiratory failure (HCC) Active Problems:   Atrial fibrillation, chronic [facility])   Hospital acquired PNA   Physical deconditioning   Discharge Condition: Stable and improved.  Discharge to ALF for further care and rehabilitation.  Patient to follow-up with PCP in 10 days after discharge from facility.  CODE STATUS: DNR  Diet recommendation: Heart healthy modified carbohydrate diet.  Filed Weights   09/07/21 2200  Weight: 70.4 kg    History of present illness:  88 y.o. female.   has a past medical history of Anxiety, Arthritis, Congestive heart failure ([place]), Depression, Diastolic dysfunction, Dyspnea, Dysrhythmia (05-10-12), GERD (gastroesophageal reflux disease) (05-10-12), Hearing loss (05-10-12), Heart murmur, History of kidney stones, Hyperlipidemia, Hypertension, Pneumonia (05-10-12), Stroke ([place]) (05-10-12), and Wears hearing aid was admitted on 09/07/2021  with acute hypoxic respiratory failure in the setting of recent COVID-19 infection.  Hospital Course:    1) sepsis secondary to bilateral pneumonia--patient with recent COVID-19 infection -Met sepsis criteria on admission with leukocytosis, fevers, tachycardia, tachypnea and finding of pneumonia -Admission chest x-ray with bibasilar pneumonia and small left pleural effusion WBC 14.6 >> 11.7; continue to follow trend  intermittently. -Lactic acid was greater than 2 at time of admission. -MRSA PCR negative; vancomycin was appropriately discontinued. -Patient antibiotics transitioned to oral route and so far well tolerated.   -continue to follow clinical response and provide supportive care. -Repeat chest x-ray in 6 weeks to assure complete resolution of infiltrates.   2) acute hypoxic respiratory failure =--- Secondary to 1 above -Continue to attempt to wean off oxygen currently requiring 2 L of oxygen supplementation via nasal cannula.   3) chronic PAFIB- -continue apixaban for stroke prophylaxis -continue Cardizem and metoprolol for rate control -HR stable and well-controlled currently.   4)Covid 19--hx recent COVID + 08/27/21---  -Continue symptomatic and supportive care  -patient is vaccinated but not boosted -12/22>>09/01/21 patient received Remdesivir x 5 days on her last admission.   5)HTN/HFpEF--- patient on chronic diastolic CHF, no evidence of acute CHF exacerbation at this time -Continue metoprolol, as well as isosorbide -Follow daily weights and low-sodium diet. -Resume adjusted dose of torsemide at discharge.   6)CKD 3B -Overall stable and at baseline -Continue to renally adjust medications, avoid nephrotoxic agents / dehydration / hypotension. -Continue to follow renal function trend.   7)Social/Ethics---  -patient is a DNR/DNI, readmitted from SNF. -Patient will require SNF at discharge for rehabilitation and conditioning.. -At this moment family is not looking for patient to return to same prior to admission SNF; patient is significantly weak and deconditioned; high risk for falls and inability to perform ADLs on her own at this moment.  Unfortunately family members unable to provide around-the-clock supervision and assistance for her. -New receiving facility has been peaked and they will have ability to receive patient later today 09/14/2021 at discharge.    8)Dementia/Depression/Anxiety- -patient is not agitated -Continue Cymbalta, use lorazepam as needed. -Continue providing constant reorientation and supportive care.. -Mood is stable currently.   9)DM2 -A1c  6.1 reflecting excellent diabetic control PTA, -continue the use on Novolog/Humalog Sliding scale insulin with Accu-Cheks/Fingersticks as ordered. -Continue to follow CBGs.   10) hyponatremia/hypokalemia- -Safe to resume torsemide adjusted dose -Reassess volume status at follow-up visit and further adjust diuretics as needed -Patient advised to maintain adequate hydration.    Procedures: See below for x-ray reports.  Consultations: None  Discharge Exam: Vitals:   09/14/21 0624 09/14/21 1026  BP: (!) 146/52 (!) 148/71  Pulse: 84 68  Resp: 18   Temp: 98.1 F (36.7 C)   SpO2: 100%     General: Afebrile, no chest pain, no nausea, no vomiting.  Good saturation on 1-2 L nasal cannula supplementation.  Very hard of hearing.  Physically deconditioned and weak. Cardiovascular: Rate controlled, no rubs, no gallops, no JVD. Respiratory: Positive rhonchi bilaterally, no using accessory muscles.  Good saturation on current supplementation. Abdomen: Soft, nontender, nondistended, positive bowel sounds Extremities: No cyanosis or clubbing.  Discharge Instructions   Discharge Instructions     (HEART FAILURE PATIENTS) Call MD:  Anytime you have any of the following symptoms: 1) 3 pound weight gain in 24 hours or 5 pounds in 1 week 2) shortness of breath, with or without a dry hacking cough 3) swelling in the hands, feet or stomach 4) if you have to sleep on extra pillows at night in order to breathe.   Complete by: As directed    Diet - low sodium heart healthy   Complete by: As directed    Diet Carb Modified   Complete by: As directed    Increase activity slowly   Complete by: As directed       Allergies as of 09/14/2021       Reactions   Codeine Nausea And Vomiting    Elemental Sulfur Nausea And Vomiting   Penicillins Hives   Has patient had a PCN reaction causing immediate rash, facial/tongue/throat swelling, SOB or lightheadedness with hypotension: No Has patient had a PCN reaction causing severe rash involving mucus membranes or skin necrosis: No Has patient had a PCN reaction that required hospitalization No Has patient had a PCN reaction occurring within the last 10 years: No If all of the above answers are "NO", then may proceed with Cephalosporin use.        Medication List     STOP taking these medications    hydrALAZINE 10 MG tablet Commonly known as: APRESOLINE   predniSONE 20 MG tablet Commonly known as: DELTASONE       TAKE these medications    apixaban 5 MG Tabs tablet Commonly known as: ELIQUIS Take 1 tablet (5 mg total) by mouth 2 (two) times daily.   ascorbic acid 500 MG tablet Commonly known as: VITAMIN C Take 1 tablet (500 mg total) by mouth daily.   atorvastatin 40 MG tablet Commonly known as: LIPITOR Take 1 tablet (40 mg total) by mouth daily.   cefdinir 300 MG capsule Commonly known as: OMNICEF Take 1 capsule (300 mg total) by mouth every 12 (twelve) hours for 3 days.   diltiazem 180 MG 24 hr capsule Commonly known as: CARDIZEM CD Take 180 mg by mouth daily.   DULoxetine 60 MG capsule Commonly known as: CYMBALTA Take 60 mg by mouth daily.   guaiFENesin-dextromethorphan 100-10 MG/5ML syrup Commonly known as: ROBITUSSIN DM Take 10 mLs by mouth every 4 (four) hours as needed for cough.   insulin aspart 100 UNIT/ML injection Commonly known as: novoLOG Inject 0-5 Units into the skin  at bedtime. CBG 70-200 provide 0 units CBG 201-250 provide 2 units CBG 251-300 provide 3 units CBG 301-350 provide 4 units CBG 351-400 provide 5 units CBG over 400 please contact medical provider.   insulin aspart 100 UNIT/ML injection Commonly known as: novoLOG Inject 0-6 Units into the skin 3 (three) times daily with  meals. CBG is 70-120 provide 0 units CBG 121-150 provide 1 unit CBG 151-250 provide 2 units CBG 251-300 provide 3 units CBG 301-350 provide 4 units CBG more than 400 provide 6 units and call medical provider.   insulin detemir 100 UNIT/ML injection Commonly known as: LEVEMIR Inject 0.07 mLs (7 Units total) into the skin daily. What changed:  how much to take when to take this   Ipratropium-Albuterol 20-100 MCG/ACT Aers respimat Commonly known as: COMBIVENT Inhale 1 puff into the lungs 2 (two) times daily.   isosorbide mononitrate 30 MG 24 hr tablet Commonly known as: IMDUR TAKE 1 TABLET(30 MG) BY MOUTH DAILY   LORazepam 0.5 MG tablet Commonly known as: ATIVAN Take 1 tablet (0.5 mg total) by mouth every 8 (eight) hours as needed for anxiety. What changed:  when to take this reasons to take this   metoprolol tartrate 50 MG tablet Commonly known as: LOPRESSOR Take 50 mg by mouth 2 (two) times daily.   polyethylene glycol 17 g packet Commonly known as: MIRALAX / GLYCOLAX Take 17 g by mouth daily.   Potassium Chloride ER 20 MEQ Tbcr Take 1 tablet by mouth daily.   torsemide 20 MG tablet Commonly known as: Demadex Take 1 tablet (20 mg total) by mouth every other day. What changed: when to take this   traMADol 50 MG tablet Commonly known as: ULTRAM Take 1 tablet (50 mg total) by mouth every 12 (twelve) hours as needed for severe pain. What changed: when to take this   zinc sulfate 220 (50 Zn) MG capsule Take 1 capsule (220 mg total) by mouth daily.               Durable Medical Equipment  (From admission, onward)           Start     Ordered   09/14/21 1418  For home use only DME oxygen  Once       Question Answer Comment  Length of Need 12 Months   Mode or (Route) Nasal cannula   Liters per Minute 2   Oxygen delivery system Gas      09/14/21 1440   09/14/21 1416  For home use only DME Hospital bed  Once       Comments: 35 degrees head elevation   Question Answer Comment  Length of Need 12 Months   Patient has (list medical condition): chronic diastolic heart failure, GERD   The above medical condition requires: Patient requires the ability to reposition frequently   Head must be elevated greater than: Other see comments   Bed type Semi-electric   Trapeze Bar Yes      09/14/21 1417           Allergies  Allergen Reactions   Codeine Nausea And Vomiting   Elemental Sulfur Nausea And Vomiting   Penicillins Hives    Has patient had a PCN reaction causing immediate rash, facial/tongue/throat swelling, SOB or lightheadedness with hypotension: No Has patient had a PCN reaction causing severe rash involving mucus membranes or skin necrosis: No Has patient had a PCN reaction that required hospitalization No Has patient had a PCN reaction  occurring within the last 10 years: No If all of the above answers are "NO", then may proceed with Cephalosporin use.     Follow-up Information     [name], MD. Schedule an appointment as soon as possible for a visit in 10 day(s).   Specialty: Internal Medicine Contact information: [address] [place] [address] [phone]         Nahser, Wonda Cheng, MD .   Specialty: Cardiology Contact information: [place] [address] [phone]                 The results of significant diagnostics from this hospitalization (including imaging, microbiology, ancillary and laboratory) are listed below for reference.    Significant Diagnostic Studies: DG Chest 2 View  Result Date: 09/07/2021 CLINICAL DATA:  Shortness of breath. EXAM: CHEST - 2 VIEW COMPARISON:  Chest x-ray 08/27/2021. FINDINGS: Right lower lobe airspace disease has resolved. There is a new small left pleural effusion. There are new patchy opacities in left lung base. There is no evidence for pneumothorax. The heart is enlarged, unchanged. No acute fractures are  seen. Orthopedic nail is identified in the proximal right humerus. IMPRESSION: 1. New small left pleural effusion with new left basilar airspace disease. 2. Interval resolution of right lower lobe airspace disease. Electronically Signed   By: Ronney Asters M.D.   On: 09/07/2021 16:09   DG Chest Port 1 View  Result Date: 08/27/2021 CLINICAL DATA:  Weakness, cough, shortness of breath EXAM: PORTABLE CHEST 1 VIEW COMPARISON:  Chest radiograph 06/16/2021 FINDINGS: The cardiomediastinal silhouette is within normal limits. There is density projecting over the lateral right hemithorax which may reflect an extrapleural mass or loculated pleural fluid. There is hazy density in the adjacent right lower lobe. The left lung is clear. There is no left effusion. There is no pneumothorax. Postsurgical changes are seen in the right humerus. IMPRESSION: Density projecting over the lateral right hemithorax may reflect loculated pleural fluid or a pleural-based mass. There is hazy opacity in the adjacent right lower lobe. Recommend CT chest (preferably with contrast) for further evaluation. Electronically Signed   By: Valetta Mole M.D.   On: 08/27/2021 12:12    Microbiology: Recent Results (from the past 240 hour(s))  Blood culture (routine x 2)     Status: None   Collection Time: 09/07/21  4:19 PM   Specimen: Right Antecubital; Blood  Result Value Ref Range Status   Specimen Description RIGHT ANTECUBITAL  Final   Special Requests   Final    BOTTLES DRAWN AEROBIC AND ANAEROBIC Blood Culture adequate volume   Culture   Final    NO GROWTH 5 DAYS Performed at [facility], [address] [place], [place] [address]    Report Status 09/12/2021 FINAL  Final  Blood culture (routine x 2)     Status: None   Collection Time: 09/07/21  4:19 PM   Specimen: Left Antecubital; Blood  Result Value Ref Range Status   Specimen Description LEFT ANTECUBITAL  Final   Special Requests   Final    BOTTLES DRAWN AEROBIC AND  ANAEROBIC Blood Culture adequate volume   Culture   Final    NO GROWTH 5 DAYS Performed at [facility], [address], [place], [place] [address]    Report Status 09/12/2021 FINAL  Final  Resp Panel by RT-PCR (Flu A&B, Covid) Nasopharyngeal Swab     Status: Abnormal   Collection Time: 09/07/21  6:27 PM   Specimen: Nasopharyngeal Swab; Nasopharyngeal(NP) swabs in vial transport medium  Result Value Ref Range Status   SARS Coronavirus 2 by RT PCR POSITIVE (A) NEGATIVE Final    Comment: (NOTE) SARS-CoV-2 target nucleic acids are DETECTED.  The SARS-CoV-2 RNA is generally detectable in upper respiratory specimens during the acute phase of infection. Positive results are indicative of the presence of the identified virus, but do not rule out bacterial infection or co-infection with other pathogens not detected by the test. Clinical correlation with patient history and other diagnostic information is necessary to determine patient infection status. The expected result is Negative.  Fact Sheet for Patients: EntrepreneurPulse.com.au  Fact Sheet for Healthcare Providers: IncredibleEmployment.be  This test is not yet approved or cleared by the [place] FDA and  has been authorized for detection and/or diagnosis of SARS-CoV-2 by FDA under an Emergency Use Authorization (EUA).  This EUA will remain in effect (meaning this test can be used) for the duration of  the COVID-19 declaration under Section 564(b)(1) of the A ct, 21 U.S.C. section 360bbb-3(b)(1), unless the authorization is terminated or revoked sooner.     Influenza A by PCR NEGATIVE NEGATIVE Final   Influenza B by PCR NEGATIVE NEGATIVE Final    Comment: (NOTE) The Xpert Xpress SARS-CoV-2/FLU/RSV plus assay is intended as an aid in the diagnosis of influenza from Nasopharyngeal swab specimens and should not be used as a sole basis for treatment. Nasal washings and aspirates are  unacceptable for Xpert Xpress SARS-CoV-2/FLU/RSV testing.  Fact Sheet for Patients: EntrepreneurPulse.com.au  Fact Sheet for Healthcare Providers: IncredibleEmployment.be  This test is not yet approved or cleared by the [place] FDA and has been authorized for detection and/or diagnosis of SARS-CoV-2 by FDA under an Emergency Use Authorization (EUA). This EUA will remain in effect (meaning this test can be used) for the duration of the COVID-19 declaration under Section 564(b)(1) of the Act, 21 U.S.C. section 360bbb-3(b)(1), unless the authorization is terminated or revoked.  Performed at [facility], [address], [place], [place] [address]   MRSA Next Gen by PCR, Nasal     Status: None   Collection Time: 09/08/21 12:24 PM   Specimen: Nasal Mucosa; Nasal Swab  Result Value Ref Range Status   MRSA by PCR Next Gen NOT DETECTED NOT DETECTED Final    Comment: (NOTE) The GeneXpert MRSA Assay (FDA approved for NASAL specimens only), is one component of a comprehensive MRSA colonization surveillance program. It is not intended to diagnose MRSA infection nor to guide or monitor treatment for MRSA infections. Test performance is not FDA approved in patients less than 56 years old. Performed at [facility], [address], [place], [place] [address]      Labs: Basic Metabolic Panel: Recent Labs  Lab 09/07/21 1536 09/08/21 0544 09/09/21 0641 09/09/21 0647  NA 130* 132* 131*  --   K 3.5 3.1* 4.7  --   CL 89* 91* 98  --   CO2 32 33* 24  --   GLUCOSE 246* 139* 113*  --   BUN 20 22 25*  --   CREATININE 1.01* 1.08* 1.04*  --   CALCIUM 8.8* 8.8* 8.5*  --   MG  --   --   --  2.1  PHOS  --   --  2.6  --    Liver Function Tests: Recent Labs  Lab 09/07/21 1536 09/09/21 0641  AST 20  --   ALT 17  --  ALKPHOS 104  --   BILITOT 1.0  --   PROT 5.7*  --   ALBUMIN 2.8* 2.6*   CBC: Recent Labs  Lab 09/07/21 1536 09/08/21 0544  09/09/21 0647  WBC 14.6* 11.1* 11.7*  NEUTROABS 12.7*  --   --   HGB 12.1 10.7* 11.3*  HCT 35.4* 31.5* 33.4*  MCV 92.7 93.5 93.0  PLT 178 150 164   BNP (last 3 results) Recent Labs    08/27/21 1020 09/07/21 1537  BNP 392.0* 237.0*   CBG: Recent Labs  Lab 09/13/21 1212 09/13/21 1629 09/13/21 2140 09/14/21 0733 09/14/21 1120  GLUCAP 118* 262* 113* 112* 157*    Signed:  Barton Dubois MD.  [facility] 09/14/2021, 3:14 PM

## 2021-09-14 NOTE — TOC Progression Note (Signed)
Transition of Care [facility]) - Progression Note    Patient Details  Name: Carrie Best MRN: [id number] Date of Birth: 1927-12-10  Transition of Care [facility]) CM/SW Contact  Annice Needy, LCSW Phone Number: 09/14/2021, 12:37 PM  Clinical Narrative:    Jory Sims spoke with Sherrian Divers at [facility], [address] [place], [place] [address]. Confirmed admission for 09/14/21.  Message left for daughter-in-law, [name], requesting return contact regarding discharge.  Patient's home oxygen is currently through [employer]. Spoke with Morrie Sheldon with [employer] regarding transferring oxygen and ordering hospital bed.  Attending aware of orders needed.    Expected Discharge Plan: Skilled Nursing Facility Barriers to Discharge: Other (must enter comment) (Family working on placement plan)  Expected Discharge Plan and Services Expected Discharge Plan: Skilled Nursing Facility In-house Referral: Clinical Social Work     Living arrangements for the past 2 months: Skilled Nursing Facility                                       Social Determinants of Health (SDOH) Interventions    Readmission Risk Interventions Readmission Risk Prevention Plan 09/09/2021 09/01/2021  Transportation Screening Complete Complete  PCP or Specialist Appt within 5-7 Days - Complete  Home Care Screening - Complete  Medication Review (RN CM) - Complete  HRI or Home Care Consult Complete -  Social Work Consult for Recovery Care Planning/Counseling Complete -  Palliative Care Screening Not Applicable -  Medication Review Oceanographer) Complete -  Some recent data might be hidden

## 2021-09-14 NOTE — NC FL2 (Signed)
[place] LEVEL OF CARE SCREENING TOOL     IDENTIFICATION  Patient Name: Carrie Best Birthdate: 1927-11-05 Sex: female Admission Date (Current Location): 09/07/2021  [facility] and [place] Number:  [employer] and Address:  [place] [address] [place]      Provider Number: [id number]  Attending Physician Name and Address:  Barton Dubois, MD  Relative Name and Phone Number:  Deneice Brautigan  [phone]    Current Level of Care: Hospital Recommended Level of Care: [place] Prior Approval Number:    Date Approved/Denied:   PASRR Number:    Discharge Plan: Domiciliary (Rest home)    Current Diagnoses: Patient Active Problem List   Diagnosis Date Noted   Physical deconditioning    Acute hypoxemic respiratory failure ([place]) 09/07/2021   Pleural effusion    Dementia without behavioral disturbance, psychotic disturbance, mood disturbance, or anxiety ([place])    History of 2019 novel coronavirus disease (COVID-19)    Sepsis without acute organ dysfunction ([place])    Prediabetes    Weakness    COVID-19 virus infection 08/27/2021   Chronic renal disease, stage III ([place]) 08/27/2021   Acute on chronic heart failure with preserved ejection fraction (HFpEF) ([place]) 07/25/2020   Acute exacerbation of CHF (congestive heart failure) ([place]) 07/24/2020   Constipation 07/24/2020   Leukocytosis 07/24/2020   Elevated brain natriuretic peptide (BNP) level 07/24/2020   Hospital acquired PNA 07/24/2020   OSA (obstructive sleep apnea) 07/10/2020   Nocturnal hypoxia 07/10/2020   Acute confusion 07/10/2020   CVA (cerebral vascular accident) ([place]) 06/19/2019   Acute encephalopathy 06/18/2019   Acute on chronic respiratory failure with hypoxia ([place]) 09/10/2018   Chronic kidney disease (CKD), stage II (mild) 09/10/2018   Amyloidosis of bladder ([place]) 09/10/2018   CHF exacerbation ([place]) 09/09/2018   ARF (acute renal failure)  ([place]) 07/10/2018   Nausea & vomiting 07/09/2018   Hypokalemia 12/25/2017   Acute on chronic diastolic CHF (congestive heart failure) ([place]) 12/24/2017   Failed total knee arthroplasty ([place]) 05/19/2016   Rupture of right patellar tendon 05/19/2016   H/O nonunion of fracture 03/16/2016   Arthritis of knee 02/17/2015   Primary osteoarthritis of right knee Valgus 02/14/2015   Chronic diastolic CHF (congestive heart failure) ([place]) 02/06/2015   Hyperglycemia 01/28/2015   Proximal humerus fracture 06/10/2014   Obesity 03/18/2012   HTN (hypertension) 03/18/2012   GERD (gastroesophageal reflux disease) 03/18/2012   Hyperlipidemia with target LDL less than 70 03/18/2012   Sleep disturbance 03/18/2012   Atrial fibrillation, chronic ([place]) 03/18/2012    Orientation RESPIRATION BLADDER Height & Weight     Self  O2 (3L) External catheter Weight: 155 lb 3.3 oz (70.4 kg) Height:  5\' 8"  (172.7 cm)  BEHAVIORAL SYMPTOMS/MOOD NEUROLOGICAL BOWEL NUTRITION STATUS      Continent Diet (Heart healthy. See d/c summary for updates.)  AMBULATORY STATUS COMMUNICATION OF NEEDS Skin   Extensive Assist Verbally Bruising                       Personal Care Assistance Level of Assistance  Bathing, Feeding, Dressing Bathing Assistance: Maximum assistance Feeding assistance: Limited assistance Dressing Assistance: Maximum assistance     Functional Limitations Info  Sight, Hearing, Speech Sight Info: Adequate Hearing Info: Impaired Speech Info: Adequate    SPECIAL CARE FACTORS FREQUENCY  PT (By licensed PT)     PT Frequency: 5x weekly  Contractures Contractures Info: Not present    Additional Factors Info  Isolation Precautions Code Status Info: DNR Allergies Info: Codeine, Penicillins, Elemental Sulfur     Isolation Precautions Info: Covid + 08/27/21     Current Medications (09/14/2021):  This is the current hospital active medication list Current Facility-Administered  Medications  Medication Dose Route Frequency Provider Last Rate Last Admin   apixaban (ELIQUIS) tablet 5 mg  5 mg Oral BID Emeterio Reeve, DO   5 mg at 09/14/21 1027   ascorbic acid (VITAMIN C) tablet 500 mg  500 mg Oral Daily Emeterio Reeve, DO   500 mg at 09/14/21 1026   atorvastatin (LIPITOR) tablet 40 mg  40 mg Oral Daily Emeterio Reeve, DO   40 mg at 09/14/21 1026   cefdinir (OMNICEF) capsule 300 mg  300 mg Oral Q12H Barton Dubois, MD   300 mg at 09/14/21 1026   diltiazem (CARDIZEM CD) 24 hr capsule 180 mg  180 mg Oral Daily Emeterio Reeve, DO   180 mg at 09/14/21 1027   DULoxetine (CYMBALTA) DR capsule 60 mg  60 mg Oral Daily Emeterio Reeve, DO   60 mg at 09/14/21 1026   guaiFENesin-dextromethorphan (ROBITUSSIN DM) 100-10 MG/5ML syrup 10 mL  10 mL Oral Q4H PRN Emeterio Reeve, DO       insulin aspart (novoLOG) injection 0-15 Units  0-15 Units Subcutaneous TID WC Emeterio Reeve, DO   3 Units at 09/14/21 1226   insulin aspart (novoLOG) injection 0-5 Units  0-5 Units Subcutaneous QHS Emeterio Reeve, DO       insulin aspart (novoLOG) injection 3 Units  3 Units Subcutaneous TID WC Emeterio Reeve, DO   3 Units at 09/14/21 1226   insulin detemir (LEVEMIR) injection 7 Units  7 Units Subcutaneous Daily Emeterio Reeve, DO   7 Units at 09/14/21 1055   isosorbide mononitrate (IMDUR) 24 hr tablet 30 mg  30 mg Oral Daily Emeterio Reeve, DO   30 mg at 09/14/21 1027   LORazepam (ATIVAN) tablet 0.5 mg  0.5 mg Oral TID PRN Emeterio Reeve, DO       metoprolol tartrate (LOPRESSOR) tablet 50 mg  50 mg Oral BID Emeterio Reeve, DO   50 mg at 09/14/21 1026   polyethylene glycol (MIRALAX / GLYCOLAX) packet 17 g  17 g Oral Daily Emeterio Reeve, DO   17 g at 09/14/21 1026   potassium chloride SA (KLOR-CON M) CR tablet 20 mEq  20 mEq Oral Daily Emeterio Reeve, DO   20 mEq at 09/14/21 1026   traMADol (ULTRAM) tablet 50 mg  50 mg Oral Q8H PRN Emeterio Reeve,  DO       zinc sulfate capsule 220 mg  220 mg Oral Daily Emeterio Reeve, DO   220 mg at 09/14/21 1027     Discharge Medications: TAKE these medications     apixaban 5 MG Tabs tablet Commonly known as: ELIQUIS Take 1 tablet (5 mg total) by mouth 2 (two) times daily.    ascorbic acid 500 MG tablet Commonly known as: VITAMIN C Take 1 tablet (500 mg total) by mouth daily.    atorvastatin 40 MG tablet Commonly known as: LIPITOR Take 1 tablet (40 mg total) by mouth daily.    cefdinir 300 MG capsule Commonly known as: OMNICEF Take 1 capsule (300 mg total) by mouth every 12 (twelve) hours for 3 days.    diltiazem 180 MG 24 hr capsule Commonly known as: CARDIZEM CD Take 180 mg by mouth daily.  DULoxetine 60 MG capsule Commonly known as: CYMBALTA Take 60 mg by mouth daily.    guaiFENesin-dextromethorphan 100-10 MG/5ML syrup Commonly known as: ROBITUSSIN DM Take 10 mLs by mouth every 4 (four) hours as needed for cough.    insulin aspart 100 UNIT/ML injection Commonly known as: novoLOG Inject 0-5 Units into the skin at bedtime. CBG 70-200 provide 0 units CBG 201-250 provide 2 units CBG 251-300 provide 3 units CBG 301-350 provide 4 units CBG 351-400 provide 5 units CBG over 400 please contact medical provider.    insulin aspart 100 UNIT/ML injection Commonly known as: novoLOG Inject 0-6 Units into the skin 3 (three) times daily with meals. CBG is 70-120 provide 0 units CBG 121-150 provide 1 unit CBG 151-250 provide 2 units CBG 251-300 provide 3 units CBG 301-350 provide 4 units CBG more than 400 provide 6 units and call medical provider.    insulin detemir 100 UNIT/ML injection Commonly known as: LEVEMIR Inject 0.07 mLs (7 Units total) into the skin daily. What changed:  how much to take when to take this    Ipratropium-Albuterol 20-100 MCG/ACT Aers respimat Commonly known as: COMBIVENT Inhale 1 puff into the lungs 2 (two) times daily.    isosorbide  mononitrate 30 MG 24 hr tablet Commonly known as: IMDUR TAKE 1 TABLET(30 MG) BY MOUTH DAILY    LORazepam 0.5 MG tablet Commonly known as: ATIVAN Take 1 tablet (0.5 mg total) by mouth every 8 (eight) hours as needed for anxiety. What changed:  when to take this reasons to take this    metoprolol tartrate 50 MG tablet Commonly known as: LOPRESSOR Take 50 mg by mouth 2 (two) times daily.    polyethylene glycol 17 g packet Commonly known as: MIRALAX / GLYCOLAX Take 17 g by mouth daily.    Potassium Chloride ER 20 MEQ Tbcr Take 1 tablet by mouth daily.    torsemide 20 MG tablet Commonly known as: Demadex Take 1 tablet (20 mg total) by mouth every other day. What changed: when to take this    traMADol 50 MG tablet Commonly known as: ULTRAM Take 1 tablet (50 mg total) by mouth every 12 (twelve) hours as needed for severe pain. What changed: when to take this    zinc sulfate 220 (50 Zn) MG capsule Take 1 capsule (220 mg total) by mouth daily.      Relevant Imaging Results:  Relevant Lab Results:   Additional Information SS# [id number]  Ihor Gully, LCSW

## 2021-09-15 LAB — GLUCOSE, CAPILLARY: Glucose-Capillary: 92 mg/dL (ref 70–99)

## 2021-09-15 NOTE — TOC Transition Note (Signed)
Transition of Care [facility]) - CM/SW Discharge Note   Patient Details  Name: Carrie Best MRN: [id number] Date of Birth: December 22, 1927  Transition of Care [facility] CM/SW Contact:  Salome Arnt, [place] Phone Number: 09/15/2021, 10:15 AM   Clinical Narrative:   LCSW confirmed pt on EMS list. Per Mardene Celeste, [facility] facility, they have everything they need for pt and are ready for admission. RN updated.     Final next level of care: Assisted Living Barriers to Discharge: No Barriers Identified   Patient Goals and CMS Choice Patient states their goals for this hospitalization and ongoing recovery are:: anticipate return to SNF      Discharge Placement              Patient chooses bed at: Other - please specify in the comment section below: ([facility]) Patient to be transferred to facility by: [facility] Name of family member notified: Debbie, dil Patient and family notified of of transfer: 09/14/21  Discharge Plan and Services In-house Referral: Clinical Social Work                                   Social Determinants of Health (SDOH) Interventions     Readmission Risk Interventions Readmission Risk Prevention Plan 09/09/2021 09/01/2021  Transportation Screening Complete Complete  PCP or Specialist Appt within 5-7 Days - Complete  Home Care Screening - Complete  Medication Review (RN CM) - Complete  HRI or Home Care Consult Complete -  Social Work Consult for [place] Planning/Counseling Complete -  Palliative Care Screening Not Applicable -  Medication Review Press photographer) Complete -  Some recent data might be hidden

## 2021-09-15 NOTE — Progress Notes (Signed)
Pt discharged to [facility] via [facility]. EMS has possession of pt's belongings  Pt family member updated on discharge.

## 2021-09-15 NOTE — Progress Notes (Signed)
No overnight events. Chart and discharge summary reviewed, no changes needed. Patient is hemodynamically stable and ready for discharge to designated facility; unfortunately not discharge as planned on 09/14/21 due to lack of transportation.  Please referred to discharge summary written on 09/14/21 for further info/details on discharge instructions and plans.  Vassie Loll MD [phone]

## 2021-09-16 DIAGNOSIS — F03A4 Unspecified dementia, mild, with anxiety: Secondary | ICD-10-CM | POA: Diagnosis not present

## 2021-09-16 DIAGNOSIS — E119 Type 2 diabetes mellitus without complications: Secondary | ICD-10-CM | POA: Diagnosis not present

## 2021-09-16 DIAGNOSIS — N183 Chronic kidney disease, stage 3 unspecified: Secondary | ICD-10-CM | POA: Diagnosis not present

## 2021-09-16 DIAGNOSIS — I5032 Chronic diastolic (congestive) heart failure: Secondary | ICD-10-CM | POA: Diagnosis not present

## 2021-09-16 DIAGNOSIS — J9621 Acute and chronic respiratory failure with hypoxia: Secondary | ICD-10-CM | POA: Diagnosis not present

## 2021-09-16 DIAGNOSIS — I10 Essential (primary) hypertension: Secondary | ICD-10-CM | POA: Diagnosis not present

## 2021-09-29 DIAGNOSIS — E785 Hyperlipidemia, unspecified: Secondary | ICD-10-CM | POA: Diagnosis not present

## 2021-09-29 DIAGNOSIS — U099 Post covid-19 condition, unspecified: Secondary | ICD-10-CM | POA: Diagnosis not present

## 2021-09-29 DIAGNOSIS — F32A Depression, unspecified: Secondary | ICD-10-CM | POA: Diagnosis not present

## 2021-09-29 DIAGNOSIS — M6281 Muscle weakness (generalized): Secondary | ICD-10-CM | POA: Diagnosis not present

## 2021-09-29 DIAGNOSIS — I5022 Chronic systolic (congestive) heart failure: Secondary | ICD-10-CM | POA: Diagnosis not present

## 2021-09-29 DIAGNOSIS — Z9981 Dependence on supplemental oxygen: Secondary | ICD-10-CM | POA: Diagnosis not present

## 2021-09-29 DIAGNOSIS — I10 Essential (primary) hypertension: Secondary | ICD-10-CM | POA: Diagnosis not present

## 2021-09-29 DIAGNOSIS — Z7901 Long term (current) use of anticoagulants: Secondary | ICD-10-CM | POA: Diagnosis not present

## 2021-09-29 DIAGNOSIS — J449 Chronic obstructive pulmonary disease, unspecified: Secondary | ICD-10-CM | POA: Diagnosis not present

## 2021-09-29 DIAGNOSIS — I69398 Other sequelae of cerebral infarction: Secondary | ICD-10-CM | POA: Diagnosis not present

## 2021-10-03 DIAGNOSIS — Z7901 Long term (current) use of anticoagulants: Secondary | ICD-10-CM | POA: Diagnosis not present

## 2021-10-03 DIAGNOSIS — M6281 Muscle weakness (generalized): Secondary | ICD-10-CM | POA: Diagnosis not present

## 2021-10-03 DIAGNOSIS — I10 Essential (primary) hypertension: Secondary | ICD-10-CM | POA: Diagnosis not present

## 2021-10-03 DIAGNOSIS — I5022 Chronic systolic (congestive) heart failure: Secondary | ICD-10-CM | POA: Diagnosis not present

## 2021-10-03 DIAGNOSIS — Z9981 Dependence on supplemental oxygen: Secondary | ICD-10-CM | POA: Diagnosis not present

## 2021-10-03 DIAGNOSIS — U099 Post covid-19 condition, unspecified: Secondary | ICD-10-CM | POA: Diagnosis not present

## 2021-10-03 DIAGNOSIS — I69398 Other sequelae of cerebral infarction: Secondary | ICD-10-CM | POA: Diagnosis not present

## 2021-10-03 DIAGNOSIS — F32A Depression, unspecified: Secondary | ICD-10-CM | POA: Diagnosis not present

## 2021-10-03 DIAGNOSIS — J449 Chronic obstructive pulmonary disease, unspecified: Secondary | ICD-10-CM | POA: Diagnosis not present

## 2021-10-03 DIAGNOSIS — E785 Hyperlipidemia, unspecified: Secondary | ICD-10-CM | POA: Diagnosis not present

## 2021-10-07 DIAGNOSIS — N390 Urinary tract infection, site not specified: Secondary | ICD-10-CM | POA: Diagnosis not present

## 2021-10-07 DIAGNOSIS — I5022 Chronic systolic (congestive) heart failure: Secondary | ICD-10-CM | POA: Diagnosis not present

## 2021-10-07 DIAGNOSIS — I5032 Chronic diastolic (congestive) heart failure: Secondary | ICD-10-CM | POA: Diagnosis not present

## 2021-10-07 DIAGNOSIS — U099 Post covid-19 condition, unspecified: Secondary | ICD-10-CM | POA: Diagnosis not present

## 2021-10-07 DIAGNOSIS — E785 Hyperlipidemia, unspecified: Secondary | ICD-10-CM | POA: Diagnosis not present

## 2021-10-07 DIAGNOSIS — Z9981 Dependence on supplemental oxygen: Secondary | ICD-10-CM | POA: Diagnosis not present

## 2021-10-07 DIAGNOSIS — F32A Depression, unspecified: Secondary | ICD-10-CM | POA: Diagnosis not present

## 2021-10-07 DIAGNOSIS — Z7901 Long term (current) use of anticoagulants: Secondary | ICD-10-CM | POA: Diagnosis not present

## 2021-10-07 DIAGNOSIS — I10 Essential (primary) hypertension: Secondary | ICD-10-CM | POA: Diagnosis not present

## 2021-10-07 DIAGNOSIS — J449 Chronic obstructive pulmonary disease, unspecified: Secondary | ICD-10-CM | POA: Diagnosis not present

## 2021-10-07 DIAGNOSIS — M6281 Muscle weakness (generalized): Secondary | ICD-10-CM | POA: Diagnosis not present

## 2021-10-07 DIAGNOSIS — N183 Chronic kidney disease, stage 3 unspecified: Secondary | ICD-10-CM | POA: Diagnosis not present

## 2021-10-07 DIAGNOSIS — E119 Type 2 diabetes mellitus without complications: Secondary | ICD-10-CM | POA: Diagnosis not present

## 2021-10-07 DIAGNOSIS — I69398 Other sequelae of cerebral infarction: Secondary | ICD-10-CM | POA: Diagnosis not present

## 2021-10-07 DIAGNOSIS — J9621 Acute and chronic respiratory failure with hypoxia: Secondary | ICD-10-CM | POA: Diagnosis not present

## 2021-10-08 DIAGNOSIS — E119 Type 2 diabetes mellitus without complications: Secondary | ICD-10-CM | POA: Diagnosis not present

## 2021-10-08 DIAGNOSIS — D518 Other vitamin B12 deficiency anemias: Secondary | ICD-10-CM | POA: Diagnosis not present

## 2021-10-08 DIAGNOSIS — E7849 Other hyperlipidemia: Secondary | ICD-10-CM | POA: Diagnosis not present

## 2021-10-08 DIAGNOSIS — E559 Vitamin D deficiency, unspecified: Secondary | ICD-10-CM | POA: Diagnosis not present

## 2021-10-08 DIAGNOSIS — Z79899 Other long term (current) drug therapy: Secondary | ICD-10-CM | POA: Diagnosis not present

## 2021-10-10 DIAGNOSIS — J449 Chronic obstructive pulmonary disease, unspecified: Secondary | ICD-10-CM | POA: Diagnosis not present

## 2021-10-14 DIAGNOSIS — R011 Cardiac murmur, unspecified: Secondary | ICD-10-CM | POA: Diagnosis not present

## 2021-10-15 ENCOUNTER — Inpatient Hospital Stay (HOSPITAL_COMMUNITY)
Admission: EM | Admit: 2021-10-15 | Discharge: 2021-10-20 | DRG: 189 | Disposition: A | Discharge status: Skilled Nursing Facility | Payer: Medicare Other | Source: Skilled Nursing Facility | Attending: Internal Medicine | Admitting: Internal Medicine

## 2021-10-15 ENCOUNTER — Emergency Department (HOSPITAL_COMMUNITY): Payer: Medicare Other

## 2021-10-15 ENCOUNTER — Other Ambulatory Visit: Payer: Self-pay

## 2021-10-15 ENCOUNTER — Encounter (HOSPITAL_COMMUNITY): Payer: Self-pay | Admitting: Emergency Medicine

## 2021-10-15 DIAGNOSIS — Z8616 Personal history of COVID-19: Secondary | ICD-10-CM

## 2021-10-15 DIAGNOSIS — J9601 Acute respiratory failure with hypoxia: Principal | ICD-10-CM

## 2021-10-15 DIAGNOSIS — F0390 Unspecified dementia without behavioral disturbance: Secondary | ICD-10-CM | POA: Diagnosis present

## 2021-10-15 DIAGNOSIS — F03B0 Unspecified dementia, moderate, without behavioral disturbance, psychotic disturbance, mood disturbance, and anxiety: Secondary | ICD-10-CM | POA: Diagnosis not present

## 2021-10-15 DIAGNOSIS — J9621 Acute and chronic respiratory failure with hypoxia: Secondary | ICD-10-CM | POA: Diagnosis not present

## 2021-10-15 DIAGNOSIS — N390 Urinary tract infection, site not specified: Secondary | ICD-10-CM | POA: Diagnosis not present

## 2021-10-15 DIAGNOSIS — R404 Transient alteration of awareness: Secondary | ICD-10-CM | POA: Diagnosis not present

## 2021-10-15 DIAGNOSIS — Z7901 Long term (current) use of anticoagulants: Secondary | ICD-10-CM | POA: Diagnosis not present

## 2021-10-15 DIAGNOSIS — R5381 Other malaise: Secondary | ICD-10-CM | POA: Diagnosis present

## 2021-10-15 DIAGNOSIS — Z888 Allergy status to other drugs, medicaments and biological substances status: Secondary | ICD-10-CM

## 2021-10-15 DIAGNOSIS — Z96651 Presence of right artificial knee joint: Secondary | ICD-10-CM | POA: Diagnosis not present

## 2021-10-15 DIAGNOSIS — Z7189 Other specified counseling: Secondary | ICD-10-CM | POA: Diagnosis not present

## 2021-10-15 DIAGNOSIS — A419 Sepsis, unspecified organism: Secondary | ICD-10-CM | POA: Diagnosis not present

## 2021-10-15 DIAGNOSIS — Z974 Presence of external hearing-aid: Secondary | ICD-10-CM

## 2021-10-15 DIAGNOSIS — J90 Pleural effusion, not elsewhere classified: Secondary | ICD-10-CM | POA: Diagnosis not present

## 2021-10-15 DIAGNOSIS — I5032 Chronic diastolic (congestive) heart failure: Secondary | ICD-10-CM | POA: Diagnosis present

## 2021-10-15 DIAGNOSIS — J9622 Acute and chronic respiratory failure with hypercapnia: Secondary | ICD-10-CM | POA: Diagnosis not present

## 2021-10-15 DIAGNOSIS — F32A Depression, unspecified: Secondary | ICD-10-CM | POA: Diagnosis not present

## 2021-10-15 DIAGNOSIS — R011 Cardiac murmur, unspecified: Secondary | ICD-10-CM | POA: Diagnosis not present

## 2021-10-15 DIAGNOSIS — J9602 Acute respiratory failure with hypercapnia: Secondary | ICD-10-CM | POA: Diagnosis not present

## 2021-10-15 DIAGNOSIS — J449 Chronic obstructive pulmonary disease, unspecified: Secondary | ICD-10-CM | POA: Diagnosis not present

## 2021-10-15 DIAGNOSIS — G9341 Metabolic encephalopathy: Secondary | ICD-10-CM | POA: Diagnosis present

## 2021-10-15 DIAGNOSIS — R262 Difficulty in walking, not elsewhere classified: Secondary | ICD-10-CM | POA: Diagnosis not present

## 2021-10-15 DIAGNOSIS — I482 Chronic atrial fibrillation, unspecified: Secondary | ICD-10-CM | POA: Diagnosis not present

## 2021-10-15 DIAGNOSIS — R2689 Other abnormalities of gait and mobility: Secondary | ICD-10-CM | POA: Diagnosis not present

## 2021-10-15 DIAGNOSIS — Z9841 Cataract extraction status, right eye: Secondary | ICD-10-CM

## 2021-10-15 DIAGNOSIS — I130 Hypertensive heart and chronic kidney disease with heart failure and stage 1 through stage 4 chronic kidney disease, or unspecified chronic kidney disease: Secondary | ICD-10-CM | POA: Diagnosis present

## 2021-10-15 DIAGNOSIS — I5033 Acute on chronic diastolic (congestive) heart failure: Secondary | ICD-10-CM | POA: Diagnosis not present

## 2021-10-15 DIAGNOSIS — I447 Left bundle-branch block, unspecified: Secondary | ICD-10-CM | POA: Diagnosis not present

## 2021-10-15 DIAGNOSIS — R0600 Dyspnea, unspecified: Secondary | ICD-10-CM | POA: Diagnosis not present

## 2021-10-15 DIAGNOSIS — G9340 Encephalopathy, unspecified: Secondary | ICD-10-CM | POA: Diagnosis not present

## 2021-10-15 DIAGNOSIS — F419 Anxiety disorder, unspecified: Secondary | ICD-10-CM | POA: Diagnosis present

## 2021-10-15 DIAGNOSIS — Y95 Nosocomial condition: Secondary | ICD-10-CM | POA: Diagnosis present

## 2021-10-15 DIAGNOSIS — R1312 Dysphagia, oropharyngeal phase: Secondary | ICD-10-CM | POA: Diagnosis not present

## 2021-10-15 DIAGNOSIS — N1832 Chronic kidney disease, stage 3b: Secondary | ICD-10-CM | POA: Diagnosis not present

## 2021-10-15 DIAGNOSIS — Z79899 Other long term (current) drug therapy: Secondary | ICD-10-CM

## 2021-10-15 DIAGNOSIS — J189 Pneumonia, unspecified organism: Secondary | ICD-10-CM | POA: Diagnosis present

## 2021-10-15 DIAGNOSIS — I509 Heart failure, unspecified: Secondary | ICD-10-CM | POA: Diagnosis not present

## 2021-10-15 DIAGNOSIS — E1122 Type 2 diabetes mellitus with diabetic chronic kidney disease: Secondary | ICD-10-CM | POA: Diagnosis present

## 2021-10-15 DIAGNOSIS — Z515 Encounter for palliative care: Secondary | ICD-10-CM

## 2021-10-15 DIAGNOSIS — R2681 Unsteadiness on feet: Secondary | ICD-10-CM | POA: Diagnosis not present

## 2021-10-15 DIAGNOSIS — Z66 Do not resuscitate: Secondary | ICD-10-CM | POA: Diagnosis not present

## 2021-10-15 DIAGNOSIS — R278 Other lack of coordination: Secondary | ICD-10-CM | POA: Diagnosis not present

## 2021-10-15 DIAGNOSIS — Z823 Family history of stroke: Secondary | ICD-10-CM | POA: Diagnosis not present

## 2021-10-15 DIAGNOSIS — Z9842 Cataract extraction status, left eye: Secondary | ICD-10-CM | POA: Diagnosis not present

## 2021-10-15 DIAGNOSIS — Z7401 Bed confinement status: Secondary | ICD-10-CM | POA: Diagnosis not present

## 2021-10-15 DIAGNOSIS — R0689 Other abnormalities of breathing: Secondary | ICD-10-CM | POA: Diagnosis not present

## 2021-10-15 DIAGNOSIS — N183 Chronic kidney disease, stage 3 unspecified: Secondary | ICD-10-CM | POA: Diagnosis present

## 2021-10-15 DIAGNOSIS — U071 COVID-19: Secondary | ICD-10-CM | POA: Diagnosis not present

## 2021-10-15 DIAGNOSIS — I517 Cardiomegaly: Secondary | ICD-10-CM | POA: Diagnosis not present

## 2021-10-15 DIAGNOSIS — E785 Hyperlipidemia, unspecified: Secondary | ICD-10-CM | POA: Diagnosis not present

## 2021-10-15 DIAGNOSIS — Z880 Allergy status to penicillin: Secondary | ICD-10-CM

## 2021-10-15 DIAGNOSIS — Z8673 Personal history of transient ischemic attack (TIA), and cerebral infarction without residual deficits: Secondary | ICD-10-CM | POA: Diagnosis not present

## 2021-10-15 DIAGNOSIS — M6281 Muscle weakness (generalized): Secondary | ICD-10-CM | POA: Diagnosis not present

## 2021-10-15 DIAGNOSIS — E119 Type 2 diabetes mellitus without complications: Secondary | ICD-10-CM

## 2021-10-15 DIAGNOSIS — K219 Gastro-esophageal reflux disease without esophagitis: Secondary | ICD-10-CM | POA: Diagnosis not present

## 2021-10-15 DIAGNOSIS — J811 Chronic pulmonary edema: Secondary | ICD-10-CM | POA: Diagnosis not present

## 2021-10-15 DIAGNOSIS — J9690 Respiratory failure, unspecified, unspecified whether with hypoxia or hypercapnia: Secondary | ICD-10-CM | POA: Diagnosis not present

## 2021-10-15 DIAGNOSIS — Z743 Need for continuous supervision: Secondary | ICD-10-CM | POA: Diagnosis not present

## 2021-10-15 DIAGNOSIS — G459 Transient cerebral ischemic attack, unspecified: Secondary | ICD-10-CM | POA: Diagnosis not present

## 2021-10-15 DIAGNOSIS — Z885 Allergy status to narcotic agent status: Secondary | ICD-10-CM

## 2021-10-15 LAB — CBG MONITORING, ED: Glucose-Capillary: 143 mg/dL — ABNORMAL HIGH (ref 70–99)

## 2021-10-15 LAB — I-STAT ARTERIAL BLOOD GAS, ED
Acid-Base Excess: 10 mmol/L — ABNORMAL HIGH (ref 0.0–2.0)
Bicarbonate: 38.8 mmol/L — ABNORMAL HIGH (ref 20.0–28.0)
Calcium, Ion: 1.39 mmol/L (ref 1.15–1.40)
HCT: 34 % — ABNORMAL LOW (ref 36.0–46.0)
Hemoglobin: 11.6 g/dL — ABNORMAL LOW (ref 12.0–15.0)
O2 Saturation: 96 %
Potassium: 4.3 mmol/L (ref 3.5–5.1)
Sodium: 136 mmol/L (ref 135–145)
TCO2: 41 mmol/L — ABNORMAL HIGH (ref 22–32)
pCO2 arterial: 75.1 mmHg (ref 32.0–48.0)
pH, Arterial: 7.321 — ABNORMAL LOW (ref 7.350–7.450)
pO2, Arterial: 92 mmHg (ref 83.0–108.0)

## 2021-10-15 LAB — I-STAT CHEM 8, ED
BUN: 24 mg/dL — ABNORMAL HIGH (ref 8–23)
Calcium, Ion: 1.31 mmol/L (ref 1.15–1.40)
Chloride: 91 mmol/L — ABNORMAL LOW (ref 98–111)
Creatinine, Ser: 1.2 mg/dL — ABNORMAL HIGH (ref 0.44–1.00)
Glucose, Bld: 165 mg/dL — ABNORMAL HIGH (ref 70–99)
HCT: 40 % (ref 36.0–46.0)
Hemoglobin: 13.6 g/dL (ref 12.0–15.0)
Potassium: 4.3 mmol/L (ref 3.5–5.1)
Sodium: 135 mmol/L (ref 135–145)
TCO2: 36 mmol/L — ABNORMAL HIGH (ref 22–32)

## 2021-10-15 LAB — CBC WITH DIFFERENTIAL/PLATELET
Abs Immature Granulocytes: 0.11 10*3/uL — ABNORMAL HIGH (ref 0.00–0.07)
Basophils Absolute: 0 10*3/uL (ref 0.0–0.1)
Basophils Relative: 0 %
Eosinophils Absolute: 0 10*3/uL (ref 0.0–0.5)
Eosinophils Relative: 0 %
HCT: 38.9 % (ref 36.0–46.0)
Hemoglobin: 12.3 g/dL (ref 12.0–15.0)
Immature Granulocytes: 1 %
Lymphocytes Relative: 9 %
Lymphs Abs: 0.9 10*3/uL (ref 0.7–4.0)
MCH: 31.7 pg (ref 26.0–34.0)
MCHC: 31.6 g/dL (ref 30.0–36.0)
MCV: 100.3 fL — ABNORMAL HIGH (ref 80.0–100.0)
Monocytes Absolute: 0.4 10*3/uL (ref 0.1–1.0)
Monocytes Relative: 4 %
Neutro Abs: 9.2 10*3/uL — ABNORMAL HIGH (ref 1.7–7.7)
Neutrophils Relative %: 86 %
Platelets: 196 10*3/uL (ref 150–400)
RBC: 3.88 MIL/uL (ref 3.87–5.11)
RDW: 14.5 % (ref 11.5–15.5)
WBC: 10.7 10*3/uL — ABNORMAL HIGH (ref 4.0–10.5)
nRBC: 0 % (ref 0.0–0.2)

## 2021-10-15 LAB — LACTIC ACID, PLASMA
Lactic Acid, Venous: 2.5 mmol/L (ref 0.5–1.9)
Lactic Acid, Venous: 1.9 mmol/L (ref 0.5–1.9)

## 2021-10-15 LAB — URINALYSIS, ROUTINE W REFLEX MICROSCOPIC
Bilirubin Urine: NEGATIVE
Glucose, UA: NEGATIVE mg/dL
Hgb urine dipstick: NEGATIVE
Ketones, ur: NEGATIVE mg/dL
Leukocytes,Ua: NEGATIVE
Nitrite: NEGATIVE
Protein, ur: NEGATIVE mg/dL
Specific Gravity, Urine: 1.013 (ref 1.005–1.030)
pH: 6 (ref 5.0–8.0)

## 2021-10-15 LAB — COMPREHENSIVE METABOLIC PANEL
ALT: 8 U/L (ref 0–44)
AST: 39 U/L (ref 15–41)
Albumin: 3.3 g/dL — ABNORMAL LOW (ref 3.5–5.0)
Alkaline Phosphatase: 97 U/L (ref 38–126)
Anion gap: 9 (ref 5–15)
BUN: 23 mg/dL (ref 8–23)
CO2: 33 mmol/L — ABNORMAL HIGH (ref 22–32)
Calcium: 10.1 mg/dL (ref 8.9–10.3)
Chloride: 92 mmol/L — ABNORMAL LOW (ref 98–111)
Creatinine, Ser: 1.24 mg/dL — ABNORMAL HIGH (ref 0.44–1.00)
GFR, Estimated: 41 mL/min — ABNORMAL LOW (ref >60)
Glucose, Bld: 167 mg/dL — ABNORMAL HIGH (ref 70–99)
Potassium: 5.7 mmol/L — ABNORMAL HIGH (ref 3.5–5.1)
Sodium: 134 mmol/L — ABNORMAL LOW (ref 135–145)
Total Bilirubin: 2.1 mg/dL — ABNORMAL HIGH (ref 0.3–1.2)
Total Protein: 6.6 g/dL (ref 6.5–8.1)

## 2021-10-15 LAB — MRSA NEXT GEN BY PCR, NASAL: MRSA by PCR Next Gen: NOT DETECTED

## 2021-10-15 LAB — TROPONIN I (HIGH SENSITIVITY)
Troponin I (High Sensitivity): 19 ng/L — ABNORMAL HIGH (ref <18)
Troponin I (High Sensitivity): 23 ng/L — ABNORMAL HIGH (ref <18)

## 2021-10-15 LAB — RESP PANEL BY RT-PCR (FLU A&B, COVID) ARPGX2
Influenza A by PCR: NEGATIVE
Influenza B by PCR: NEGATIVE
SARS Coronavirus 2 by RT PCR: POSITIVE — AB

## 2021-10-15 MED ORDER — GUAIFENESIN-DM 100-10 MG/5ML PO SYRP: 10.0000 mL | ORAL_SOLUTION | ORAL | Status: DC | PRN

## 2021-10-15 MED ORDER — SODIUM CHLORIDE 0.9 % IV SOLN
2.0000 g | Freq: Once | INTRAVENOUS | Status: AC
  Administered 2021-10-15: 2 g via INTRAVENOUS
  Filled 2021-10-15: qty 2

## 2021-10-15 MED ORDER — ASCORBIC ACID 500 MG PO TABS
500.0000 mg | ORAL_TABLET | Freq: Every day | ORAL | Status: DC
  Administered 2021-10-16 – 2021-10-20 (×5): 500 mg via ORAL
  Filled 2021-10-15 (×5): qty 1

## 2021-10-15 MED ORDER — TORSEMIDE 20 MG PO TABS
20.0000 mg | ORAL_TABLET | ORAL | Status: DC
  Administered 2021-10-16: 20 mg via ORAL
  Filled 2021-10-15: qty 1

## 2021-10-15 MED ORDER — METOPROLOL TARTRATE 50 MG PO TABS
50.0000 mg | ORAL_TABLET | Freq: Two times a day (BID) | ORAL | Status: DC
  Administered 2021-10-16 (×2): 50 mg via ORAL
  Filled 2021-10-15 (×2): qty 1

## 2021-10-15 MED ORDER — VANCOMYCIN HCL 750 MG/150ML IV SOLN
750.0000 mg | INTRAVENOUS | Status: DC
  Filled 2021-10-15: qty 150

## 2021-10-15 MED ORDER — IPRATROPIUM-ALBUTEROL 20-100 MCG/ACT IN AERS
1.0000 | INHALATION_SPRAY | Freq: Two times a day (BID) | RESPIRATORY_TRACT | Status: DC
  Administered 2021-10-15 – 2021-10-19 (×9): 1 via RESPIRATORY_TRACT
  Filled 2021-10-15: qty 4

## 2021-10-15 MED ORDER — ATORVASTATIN CALCIUM 40 MG PO TABS
40.0000 mg | ORAL_TABLET | Freq: Every day | ORAL | Status: DC
  Administered 2021-10-15 – 2021-10-20 (×6): 40 mg via ORAL
  Filled 2021-10-15 (×6): qty 1

## 2021-10-15 MED ORDER — SODIUM CHLORIDE 0.9 % IV SOLN
2.0000 g | INTRAVENOUS | Status: DC
  Administered 2021-10-16 – 2021-10-18 (×3): 2 g via INTRAVENOUS
  Filled 2021-10-15 (×4): qty 2

## 2021-10-15 MED ORDER — APIXABAN 5 MG PO TABS
5.0000 mg | ORAL_TABLET | Freq: Two times a day (BID) | ORAL | Status: DC
  Administered 2021-10-15 – 2021-10-16 (×3): 5 mg via ORAL
  Filled 2021-10-15 (×4): qty 1

## 2021-10-15 MED ORDER — FUROSEMIDE 10 MG/ML IJ SOLN
80.0000 mg | Freq: Once | INTRAMUSCULAR | Status: AC
  Administered 2021-10-15: 80 mg via INTRAVENOUS
  Filled 2021-10-15: qty 8

## 2021-10-15 MED ORDER — ZINC SULFATE 220 (50 ZN) MG PO CAPS
220.0000 mg | ORAL_CAPSULE | Freq: Every day | ORAL | Status: DC
  Administered 2021-10-16 – 2021-10-20 (×5): 220 mg via ORAL
  Filled 2021-10-15 (×5): qty 1

## 2021-10-15 MED ORDER — VANCOMYCIN HCL 1500 MG/300ML IV SOLN
1500.0000 mg | Freq: Once | INTRAVENOUS | Status: AC
  Administered 2021-10-15: 1500 mg via INTRAVENOUS
  Filled 2021-10-15: qty 300

## 2021-10-15 MED ORDER — DILTIAZEM HCL ER COATED BEADS 180 MG PO CP24
180.0000 mg | ORAL_CAPSULE | Freq: Every day | ORAL | Status: DC
  Administered 2021-10-15 – 2021-10-16 (×2): 180 mg via ORAL
  Filled 2021-10-15 (×3): qty 1

## 2021-10-15 MED ORDER — ISOSORBIDE MONONITRATE ER 30 MG PO TB24
30.0000 mg | ORAL_TABLET | Freq: Every day | ORAL | Status: DC
  Administered 2021-10-16: 30 mg via ORAL
  Filled 2021-10-15: qty 1

## 2021-10-15 NOTE — Assessment & Plan Note (Addendum)
Currently kidney function at baseline.Monitor BMP

## 2021-10-15 NOTE — ED Triage Notes (Signed)
Pt BIB [facility] from [facility]. Called out for low O2 sats, 70% on 4L via [place]. Lungs clear, increased respiratory effort and increased accessory muscle use. Pt placed on NRB, SpO2 94% and above on NRB. Hx afib, rate between 80-92 beats/min. LBBB on EKG. Denies pain.   150/76

## 2021-10-15 NOTE — ED Notes (Signed)
Pt removed from NRB and placed on 6L via [place]. SpO2 98% on 6L. Respirations remain regular, labored on [place], unchanged from NRB.

## 2021-10-15 NOTE — Assessment & Plan Note (Addendum)
Likely secondary to metabolic encephalopathy from pneumonia, hypoxia.    Patient has  dementia.  Delirium precautions.Currently she is oriented to place only

## 2021-10-15 NOTE — Assessment & Plan Note (Signed)
Recent hemoglobin A1c of 6.1.  Currently not taking any medications.  Monitor blood sugars.

## 2021-10-15 NOTE — Assessment & Plan Note (Signed)
Recent history of COVID in December.  COVID screen test still positive.  No need of isolation or treatment for now.

## 2021-10-15 NOTE — Progress Notes (Signed)
Pt on 3LNC tolerating well. PT refusing bipap. Bipap is in room on standby. No resp distress noted. Will continue to monitor.

## 2021-10-15 NOTE — Assessment & Plan Note (Addendum)
Multiple comorbidities on patient with  dementia.  CODE STATUS is DNR.  We consulted palliative care.  Family wants to get her back to ALF/skilled nursing facility with hospice follow-up

## 2021-10-15 NOTE — Assessment & Plan Note (Addendum)
Multifactorial secondary to possible pneumonia versus CHF exacerbation.   Uses oxygen at 2-4 liters at nursing facility.  She was admitted here in January this year for the management of bilateral pneumonia. She was found to be hypoxic on her usual requirement of oxygen. ABG showed hypercarbia,briefly  put on BiPAP. Repeat ABG shows improvement in the CO2 level, pH is normal so she is compensated.  She does not tolerate BiPAP.  Currently she is maintaining her saturation on 1 L.

## 2021-10-15 NOTE — ED Notes (Signed)
Pt placed on BiPAP per MD order by RT. Pt not able to tolerate BiPAP mask, removing mask, becoming very agitated. Pt placed on 6L via [place]. Respirations even/unlabored. Pt alert at this time. SpO2 100%.

## 2021-10-15 NOTE — Progress Notes (Signed)
Pt placed on Bipap by RT. Pt tolerating well at this time,RN aware, RT will continue to monitor.

## 2021-10-15 NOTE — Progress Notes (Signed)
Pharmacy Antibiotic Note  Carrie Best is a 88 y.o. female admitted on 10/15/2021 presenting from assisted living with low O2 sats, concern for pna.  Pharmacy has been consulted for vancomycin dosing.  Cefepime per MD  Plan: Vancomycin 1500 mg IV x 1, then 750 mg IV q 24h (eAUC 476, Goal AUC 400-550, SCr 1.24) Add MRSA PCR Monitor renal function, Cx/PCR to narrow Vancomycin levels as needed     Temp (24hrs), Avg:96.4 F (35.8 C), Min:96.4 F (35.8 C), Max:96.4 F (35.8 C)  Recent Labs  Lab 10/15/21 0806 10/15/21 0823  WBC 10.7*  --   CREATININE 1.24* 1.20*  LATICACIDVEN 2.5*  --     CrCl cannot be calculated (Unknown ideal weight.).    Allergies  Allergen Reactions   Codeine Nausea And Vomiting   Elemental Sulfur Nausea And Vomiting   Penicillins Hives    Has patient had a PCN reaction causing immediate rash, facial/tongue/throat swelling, SOB or lightheadedness with hypotension: No Has patient had a PCN reaction causing severe rash involving mucus membranes or skin necrosis: No Has patient had a PCN reaction that required hospitalization No Has patient had a PCN reaction occurring within the last 10 years: No If all of the above answers are "NO", then may proceed with Cephalosporin use.     Bertis Ruddy, PharmD Clinical Pharmacist ED Pharmacist Phone # [phone] 10/15/2021 10:55 AM

## 2021-10-15 NOTE — Assessment & Plan Note (Addendum)
Monitor on telemetry.  On Eliquis for anticoagulation .  Also on Cardizem and metoprolol for rate control.  Currently rate is well controlled.

## 2021-10-15 NOTE — ED Notes (Signed)
RN at bedside to obtain labs, informed pt of what was happening, pt stated "hey you scared me". Pt previously not speaking. Pt able to speak a few words at this time, unable to hold a conversation.

## 2021-10-15 NOTE — Assessment & Plan Note (Addendum)
Takes BuSpar, duloxetine, risperidone.  Currently on hold due to increased confusion.  We will resume this on discharge

## 2021-10-15 NOTE — Assessment & Plan Note (Addendum)
She was recently admitted here for pneumonia.  Chest x-ray showed bilateral hazy opacities suspicious for pneumonia versus atelectasis.On cefepime. Follow-up cultures, no growth till date.  Respiratory status stable

## 2021-10-15 NOTE — H&P (Signed)
History and Physical    Patient: Carrie Best [id number] DOB: 10/20/1927 DOA: 10/15/2021 DOS: the patient was seen and examined on 10/15/2021 PCP: [name], MD  Patient coming from: ALF  Chief Complaint:  Chief Complaint  Patient presents with   Shortness of Breath    HPI: Carrie Best is a 88 y.o. female with medical history significant of diastolic congestive heart failure, recent COVID infection, paroxysmal A-fib on Xarelto, dementia, diabetes type 2, hyperlipidemia, GERD who was brought from [facility] assisted [facility] facility after she was found to be hypoxic, more confused from her baseline.  Patient is chronically on 2-4 L of oxygen per minute at the nursing facility.  Today she was found to be saturating at 70% on 4 L and more confused than her baseline.  Patient has history of dementia and usually not oriented.  She was admitted here in January for management of sepsis secondary to bilateral pneumonia and was discharged to skilled facility on supplemental oxygen.  Patient has recent history of COVID diagnosed in December.   On presentation, she was hypoxic.  ABG showed hypercarbia.  ED physician were planning to put her on BiPAP.  COVID screen test found to be positive again. Lab work showed mild elevated troponin, mild elevated lactic acid level, mild leukocytosis.  UA was not suggestive of UTI.  Chest x-ray done in the emergency department showed features of CHF, small bilateral pleural effusion, bilateral hazy opacities consistent with pneumonia versus atelectasis.  CT head did not show any acute intracranial abnormalities. She was given a dose of Lasix 80 mg once and was also given a dose of vancomycin and cefepime. Patient was admitted for the management of possible healthcare associated pneumonia, acute encephalopathy, acute on chronic diastolic congestive heart failure. Patient has advanced dementia and is a very poor historian.  As per nursing facility staff, there  was no report of fever, chills, abdominal pain chest pain, diarrhea, vomiting, hematochezia, melena. During my evaluation the emergency department, she looked comfortable, hemodynamically stable and actually alert and oriented.  Review of Systems: As mentioned in the history of present illness. All other systems reviewed and are negative. Past Medical History:  Diagnosis Date   Anxiety    Arthritis    Congestive heart failure (HCC)    Congestive heart failure symptoms   Depression    Diastolic dysfunction    Dyspnea    occasionally, with exertion   Dysrhythmia 05-10-12   hx. A. Fib., rate is controlled-tx. Xarelto   GERD (gastroesophageal reflux disease) 05-10-12   tx. with meds as needed   Hearing loss 05-10-12   bilateral hearing aids.   Heart murmur    History of kidney stones    Hyperlipidemia    Hypertension    Pneumonia 05-10-12   dx. in July- tx. antibiotics   Stroke ([place]) 05-10-12   Lt.CVA note on scans-hx. freq. falls/ none past 6 months 01/21/14   Wears hearing aid    bilateral   Past Surgical History:  Procedure Laterality Date   APPENDECTOMY     CATARACT EXTRACTION, BILATERAL  05-10-12   bilateral   CYSTOSCOPY WITH RETROGRADE PYELOGRAM, URETEROSCOPY AND STENT PLACEMENT Right 01/31/2014   Procedure: CYSTOSCOPY WITH bilateral  RETROGRADE PYELOGRAM, right URETEROSCOPY AND right  STENT PLACEMENT, bladder biopsy with fulgeration;  Surgeon: Alexis Frock, MD;  Location: [facility];  Service: Urology;  Laterality: Right;   HARDWARE REMOVAL Right 09/09/2014   Procedure: HARDWARE REMOVAL RIGHT SHOULDER;  Surgeon: Francesco Runner  Tamera Punt, MD;  Location: [facility];  Service: Orthopedics;  Laterality: Right;  Right shoulder hardware removal   HOLMIUM LASER APPLICATION Right [id number]   Procedure: HOLMIUM LASER APPLICATION;  Surgeon: Alexis Frock, MD;  Location: [facility];  Service: Urology;  Laterality: Right;   HUMERUS IM NAIL Right 06/10/2014   Procedure: INTRAMEDULLARY (IM)  NAIL HUMERAL;  Surgeon: Nita Sells, MD;  Location: [facility];  Service: Orthopedics;  Laterality: Right;  Right intramedullary humeral nail   INTERSTIM IMPLANT PLACEMENT  05-10-12   now malfunctioning   INTERSTIM IMPLANT REMOVAL  05/16/2012   Procedure: REMOVAL OF INTERSTIM IMPLANT;  Surgeon: Reece Packer, MD;  Location: [facility];  Service: Urology;  Laterality: N/A;   JOINT REPLACEMENT     OTHER SURGICAL HISTORY     Bladder Surgery   SHOULDER SURGERY     TOTAL KNEE ARTHROPLASTY Right 02/17/2015   Procedure: TOTAL KNEE ARTHROPLASTY;  Surgeon: Frederik Pear, MD;  Location: [facility];  Service: Orthopedics;  Laterality: Right;   Social History:  reports that she has never smoked. She has never used smokeless tobacco. She reports that she does not drink alcohol and does not use drugs.  Allergies  Allergen Reactions   Codeine Nausea And Vomiting   Elemental Sulfur Nausea And Vomiting   Penicillins Hives    Has patient had a PCN reaction causing immediate rash, facial/tongue/throat swelling, SOB or lightheadedness with hypotension: No Has patient had a PCN reaction causing severe rash involving mucus membranes or skin necrosis: No Has patient had a PCN reaction that required hospitalization No Has patient had a PCN reaction occurring within the last 10 years: No If all of the above answers are "NO", then may proceed with Cephalosporin use.     Family History  Problem Relation Age of Onset   Stroke Mother    Stroke Brother     Prior to Admission medications   Medication Sig Start Date End Date Taking? Authorizing Provider  apixaban (ELIQUIS) 5 MG TABS tablet Take 1 tablet (5 mg total) by mouth 2 (two) times daily. 07/27/20  Yes Emokpae, Courage, MD  ascorbic acid (VITAMIN C) 500 MG tablet Take 1 tablet (500 mg total) by mouth daily. 09/02/21  Yes Barton Dubois, MD  atorvastatin (LIPITOR) 40 MG tablet Take 1 tablet (40 mg total) by mouth daily. 06/26/19  Yes Hall, Carole N, DO   busPIRone (BUSPAR) 5 MG tablet Take 2.5 mg by mouth 3 (three) times daily.   Yes [provider]  diltiazem (CARDIZEM CD) 180 MG 24 hr capsule Take 180 mg by mouth daily.   Yes [provider]  DULoxetine (CYMBALTA) 60 MG capsule Take 60 mg by mouth daily.    Yes [provider]  Ipratropium-Albuterol (COMBIVENT) 20-100 MCG/ACT AERS respimat Inhale 1 puff into the lungs 2 (two) times daily. 09/01/21  Yes Barton Dubois, MD  isosorbide mononitrate (IMDUR) 30 MG 24 hr tablet TAKE 1 TABLET(30 MG) BY MOUTH DAILY Patient taking differently: Take 30 mg by mouth daily. 10/15/19  Yes Nahser, Wonda Cheng, MD  metoprolol tartrate (LOPRESSOR) 50 MG tablet Take 50 mg by mouth 2 (two) times daily.   Yes [provider]  Potassium Chloride ER 20 MEQ TBCR Take 1 tablet by mouth daily. Patient taking differently: Take 20 mEq by mouth daily. 07/27/20  Yes Emokpae, Courage, MD  risperiDONE (RISPERDAL) 0.25 MG tablet Take 0.25-0.5 mg by mouth See admin instructions. Take 0.25mg  oral at 0800 and take 0.50mg  oral  at 2000 10/12/21  Yes [provider]  torsemide (DEMADEX) 20 MG tablet Take 1 tablet (20 mg total) by mouth every other day. 09/14/21  Yes Vassie Loll, MD  zinc sulfate 220 (50 Zn) MG capsule Take 1 capsule (220 mg total) by mouth daily. 09/02/21  Yes Vassie Loll, MD  guaiFENesin-dextromethorphan [facility] DM) 100-10 MG/5ML syrup Take 10 mLs by mouth every 4 (four) hours as needed for cough. Patient not taking: Reported on 10/15/2021 09/01/21   Vassie Loll, MD  polyethylene glycol (MIRALAX / GLYCOLAX) 17 g packet Take 17 g by mouth daily. Patient not taking: Reported on 10/15/2021 06/26/19   Darlin Drop, DO    Physical Exam: Vitals:   10/15/21 1328 10/15/21 1400 10/15/21 1430 10/15/21 1445  BP:  140/81 (!) 145/66 (!) 149/79  Pulse:  67 (!) 45 65  Resp:  (!) 21 (!) 22 18  Temp: 97.6 F (36.4 C)     TempSrc: Oral     SpO2:  98% 96% 94%   General  exam:Not in distress, very deconditioned elderly female HEENT:PERRL,Oral mucosa moist, Ear/Nose normal on gross exam Respiratory system: Bilateral diminished air sounds but no frank no wheezes or crackles  Cardiovascular system: Irregularly irregular rhythm, no JVD, murmurs, rubs, gallops or clicks. Gastrointestinal system: Abdomen is nondistended, soft and nontender. No organomegaly or masses felt. Normal bowel sounds heard. Central nervous system: Alert and awake, knew that she is in the hospital, knew current month, obeys commands  extremities: No edema, no clubbing ,no cyanosis Skin: No rashes, lesions or ulcers,no icterus ,no pallor   Assessment and Plan: * Acute respiratory failure with hypoxia (HCC)- (present on admission) Multifactorial secondary to possible pneumonia versus CHF exacerbation.   Uses oxygen at 2-4 liters at nursing facility.  She was admitted here in January this year for the management of bilateral pneumonia. She was found to be hypoxic on her usual requirement of oxygen. Continue current antibiotics for now. ABG showed hypercarbia, planning to put on BiPAP.  Continue BiPAP at least for today .  Check ABG tomorrow   Hospital acquired PNA- (present on admission) She was recently admitted here for pneumonia.  Chest x-ray showed bilateral hazy opacities suspicious for pneumonia versus atelectasis.  She was given a dose of vancomycin and cefepime.  Continue current antibiotics for now.  Follow-up cultures  Acute encephalopathy- (present on admission) Likely secondary to metabolic encephalopathy from pneumonia, hypoxia.  Monitor mental status.  Patient has  dementia.  Delirium precautions. During my evaluation, her mental status has improved and she is oriented to place and knew the current month  Chronic diastolic CHF (congestive heart failure) (HCC)- (present on admission) History of heart failure with preserved ejection fraction.  Chest x-ray showed features of CHF.   ABG showed elevated CO2.  On torsemide, metoprolol, isosorbide at home.  She was given a dose of Lasix 40 mg once in the emergency department.  Check BMP.  Continue to monitor input/output, daily weight. Last echo done on 11/21 showed EF of 70 to 75%, indeterminate left ventricular diastolic parameters.  Atrial fibrillation, chronic (HCC)- (present on admission) Monitor on telemetry.  On Eliquis for anticoagulation for history of prophylaxis.  Also on Cardizem and metoprolol for rate control.  Currently rate is well controlled.  Goals of care, counseling/discussion Multiple comorbidities on patient with  dementia.  CODE STATUS is DNR.  We will consult palliative care for goals of care.  If she does not improve, she might be a candidate  for hospice  Hyperlipidemia with target LDL less than 70- (present on admission) On Lipitor 40 mg daily.  We will continue  Controlled type 2 diabetes mellitus without complication, without long-term current use of insulin (HCC) Recent hemoglobin A1c of 6.1.  Currently not taking any medications.  Monitor blood sugars.  Physical deconditioning- (present on admission) Lives at assisted living facility.  Patient has advanced dementia.  Will request for PT/OT evaluation  History of 2019 novel coronavirus disease (COVID-19)- (present on admission) Recent history of COVID in December.  COVID screen test still positive.  No need of isolation or treatment for now.  Dementia without behavioral disturbance, psychotic disturbance, mood disturbance, or anxiety ([place])- (present on admission)  Takes BuSpar, duloxetine, risperidone.  Currently on hold due to increased confusion  Chronic renal disease, stage III ([place])- (present on admission) Currently kidney function at baseline.     Advance Care Planning:   Code Status: DNR   Consults: Palliative care  Family Communication: Called both daughter-in-law and son on [place] phones, could not connect  Severity of  Illness: The appropriate patient status for this patient is INPATIENT.   Author: Shelly Coss, MD 10/15/2021 3:00 PM  For on call review [email or web address].

## 2021-10-15 NOTE — Assessment & Plan Note (Signed)
On Lipitor 40 mg daily.  We will continue

## 2021-10-15 NOTE — Assessment & Plan Note (Addendum)
History of heart failure with preserved ejection fraction.  Chest x-ray showed features of CHF.  ABG showed elevated CO2.  On torsemide, metoprolol, isosorbide at home.  She was given a dose of Lasix 80 mg once in the emergency department. Elevated BNP.  Continue to monitor input/output, daily weight. Last echo done on 11/21 showed EF of 70 to 75%, indeterminate left ventricular diastolic parameters.  Takes torsemide at home which is currently on hold due to worsening kidney function.  Currently she looks euvolemic

## 2021-10-15 NOTE — ED Provider Notes (Signed)
[facility] EMERGENCY DEPARTMENT Provider Note   CSN: [id number] Arrival date & time: 10/15/21  [id number]     History  Chief Complaint  Patient presents with   Shortness of Breath    Carrie Best is a 88 y.o. female.  Patient is a 88 year old female who presents with weakness and altered mental status with hypoxia.  She has a history of CHF, GERD, hypertension, hyperlipidemia, atrial fibrillation on Xarelto, dementia, diabetes.  She was treated for COVID in December.  She was recently admitted in January for sepsis associated with healthcare associated pneumonia and hypoxia.  She was discharged to a skilled nursing facility.  Per EMS report, she has been on 4 L nasal cannula oxygen at the nursing facility.  She was found today to have oxygen saturations at 70% and has a decreased mental status per report.  History is limited due to her dementia and altered mental status.  Of note, she does have a DNR at bedside.      Home Medications Prior to Admission medications   Medication Sig Start Date End Date Taking? Authorizing Provider  apixaban (ELIQUIS) 5 MG TABS tablet Take 1 tablet (5 mg total) by mouth 2 (two) times daily. 07/27/20  Yes Emokpae, Courage, MD  ascorbic acid (VITAMIN C) 500 MG tablet Take 1 tablet (500 mg total) by mouth daily. 09/02/21  Yes Barton Dubois, MD  atorvastatin (LIPITOR) 40 MG tablet Take 1 tablet (40 mg total) by mouth daily. 06/26/19  Yes Hall, Carole N, DO  busPIRone (BUSPAR) 5 MG tablet Take 2.5 mg by mouth 3 (three) times daily.   Yes [provider]  diltiazem (CARDIZEM CD) 180 MG 24 hr capsule Take 180 mg by mouth daily.   Yes [provider]  DULoxetine (CYMBALTA) 60 MG capsule Take 60 mg by mouth daily.    Yes [provider]  Ipratropium-Albuterol (COMBIVENT) 20-100 MCG/ACT AERS respimat Inhale 1 puff into the lungs 2 (two) times daily. 09/01/21  Yes Barton Dubois, MD  isosorbide mononitrate (IMDUR) 30 MG  24 hr tablet TAKE 1 TABLET(30 MG) BY MOUTH DAILY Patient taking differently: Take 30 mg by mouth daily. 10/15/19  Yes Nahser, Wonda Cheng, MD  metoprolol tartrate (LOPRESSOR) 50 MG tablet Take 50 mg by mouth 2 (two) times daily.   Yes [provider]  Potassium Chloride ER 20 MEQ TBCR Take 1 tablet by mouth daily. Patient taking differently: Take 20 mEq by mouth daily. 07/27/20  Yes Emokpae, Courage, MD  risperiDONE (RISPERDAL) 0.25 MG tablet Take 0.25-0.5 mg by mouth See admin instructions. Take 0.25mg  oral at 0800 and take 0.50mg  oral at 2000 10/12/21  Yes [provider]  torsemide (DEMADEX) 20 MG tablet Take 1 tablet (20 mg total) by mouth every other day. 09/14/21  Yes Barton Dubois, MD  zinc sulfate 220 (50 Zn) MG capsule Take 1 capsule (220 mg total) by mouth daily. 09/02/21  Yes Barton Dubois, MD  guaiFENesin-dextromethorphan [facility] DM) 100-10 MG/5ML syrup Take 10 mLs by mouth every 4 (four) hours as needed for cough. Patient not taking: Reported on 10/15/2021 09/01/21   Barton Dubois, MD  insulin aspart (NOVOLOG) 100 UNIT/ML injection Inject 0-5 Units into the skin at bedtime. CBG 70-200 provide 0 units CBG 201-250 provide 2 units CBG 251-300 provide 3 units CBG 301-350 provide 4 units CBG 351-400 provide 5 units CBG over 400 please contact medical provider. Patient not taking: Reported on 10/15/2021 09/01/21   Barton Dubois, MD  insulin aspart (  NOVOLOG) 100 UNIT/ML injection Inject 0-6 Units into the skin 3 (three) times daily with meals. CBG is 70-120 provide 0 units CBG 121-150 provide 1 unit CBG 151-250 provide 2 units CBG 251-300 provide 3 units CBG 301-350 provide 4 units CBG more than 400 provide 6 units and call medical provider. Patient not taking: Reported on 09/08/2021 09/01/21   Vassie Loll, MD  insulin detemir (LEVEMIR) 100 UNIT/ML injection Inject 0.07 mLs (7 Units total) into the skin daily. Patient not taking: Reported on 10/15/2021 09/01/21   Vassie Loll, MD  LORazepam (ATIVAN) 0.5 MG tablet Take 1 tablet (0.5 mg total) by mouth every 8 (eight) hours as needed for anxiety. Patient not taking: Reported on 10/15/2021 09/14/21   Vassie Loll, MD  polyethylene glycol (MIRALAX / GLYCOLAX) 17 g packet Take 17 g by mouth daily. Patient not taking: Reported on 10/15/2021 06/26/19   Darlin Drop, DO  traMADol (ULTRAM) 50 MG tablet Take 1 tablet (50 mg total) by mouth every 12 (twelve) hours as needed for severe pain. Patient not taking: Reported on 10/15/2021 09/14/21   Vassie Loll, MD      Allergies    Codeine, Elemental sulfur, and Penicillins    Review of Systems   Review of Systems  Unable to perform ROS: Dementia   Physical Exam Updated Vital Signs BP (!) 111/100    Pulse (!) 102    Temp (!) 96.4 F (35.8 C) (Rectal)    Resp 16    SpO2 100%  Physical Exam Constitutional:      Appearance: She is well-developed. She is ill-appearing.  HENT:     Head: Normocephalic and atraumatic.  Eyes:     Pupils: Pupils are equal, round, and reactive to light.  Cardiovascular:     Rate and Rhythm: Normal rate and regular rhythm.     Heart sounds: Normal heart sounds.  Pulmonary:     Effort: Pulmonary effort is normal. Tachypnea present. No respiratory distress.     Breath sounds: Rhonchi present. No wheezing or rales.  Chest:     Chest wall: No tenderness.  Abdominal:     General: Bowel sounds are normal.     Palpations: Abdomen is soft.     Tenderness: There is no abdominal tenderness. There is no guarding or rebound.  Musculoskeletal:        General: Normal range of motion.     Cervical back: Normal range of motion and neck supple.     Right lower leg: No edema.     Left lower leg: No edema.  Lymphadenopathy:     Cervical: No cervical adenopathy.  Skin:    General: Skin is warm and dry.     Findings: No rash.  Neurological:     Mental Status: She is alert.     Comments: Is asleep with eyes closed but will respond to painful  stimuli    ED Results / Procedures / Treatments   Labs (all labs ordered are listed, but only abnormal results are displayed) Labs Reviewed  RESP PANEL BY RT-PCR (FLU A&B, COVID) ARPGX2 - Abnormal; Notable for the following components:      Result Value   SARS Coronavirus 2 by RT PCR POSITIVE (*)    All other components within normal limits  COMPREHENSIVE METABOLIC PANEL - Abnormal; Notable for the following components:   Sodium 134 (*)    Potassium 5.7 (*)    Chloride 92 (*)    CO2 33 (*)  Glucose, Bld 167 (*)    Creatinine, Ser 1.24 (*)    Albumin 3.3 (*)    Total Bilirubin 2.1 (*)    GFR, Estimated 41 (*)    All other components within normal limits  LACTIC ACID, PLASMA - Abnormal; Notable for the following components:   Lactic Acid, Venous 2.5 (*)    All other components within normal limits  CBC WITH DIFFERENTIAL/PLATELET - Abnormal; Notable for the following components:   WBC 10.7 (*)    MCV 100.3 (*)    Neutro Abs 9.2 (*)    Abs Immature Granulocytes 0.11 (*)    All other components within normal limits  CBG MONITORING, ED - Abnormal; Notable for the following components:   Glucose-Capillary 143 (*)    All other components within normal limits  I-STAT CHEM 8, ED - Abnormal; Notable for the following components:   Chloride 91 (*)    BUN 24 (*)    Creatinine, Ser 1.20 (*)    Glucose, Bld 165 (*)    TCO2 36 (*)    All other components within normal limits  I-STAT ARTERIAL BLOOD GAS, ED - Abnormal; Notable for the following components:   pH, Arterial 7.321 (*)    pCO2 arterial 75.1 (*)    Bicarbonate 38.8 (*)    TCO2 41 (*)    Acid-Base Excess 10.0 (*)    HCT 34.0 (*)    Hemoglobin 11.6 (*)    All other components within normal limits  TROPONIN I (HIGH SENSITIVITY) - Abnormal; Notable for the following components:   Troponin I (High Sensitivity) 19 (*)    All other components within normal limits  TROPONIN I (HIGH SENSITIVITY) - Abnormal; Notable for the  following components:   Troponin I (High Sensitivity) 23 (*)    All other components within normal limits  MRSA NEXT GEN BY PCR, NASAL  LACTIC ACID, PLASMA  URINALYSIS, ROUTINE W REFLEX MICROSCOPIC  CBG MONITORING, ED    EKG EKG Interpretation  Date/Time:  Thursday October 15 2021 07:44:04 EST Ventricular Rate:  74 PR Interval:    QRS Duration: 137 QT Interval:  421 QTC Calculation: 468 R Axis:   -47 Text Interpretation: Atrial fibrillation Left bundle branch block since last tracing no significant change Confirmed by Malvin Johns [phone]) on 10/15/2021 10:52:06 AM  Radiology CT Head Wo Contrast  Result Date: 10/15/2021 CLINICAL DATA:  Mental status change, unknown cause EXAM: CT HEAD WITHOUT CONTRAST TECHNIQUE: Contiguous axial images were obtained from the base of the skull through the vertex without intravenous contrast. RADIATION DOSE REDUCTION: This exam was performed according to the departmental dose-optimization program which includes automated exposure control, adjustment of the mA and/or kV according to patient size and/or use of iterative reconstruction technique. COMPARISON:  06/19/2019 FINDINGS: Brain: There is no acute intracranial hemorrhage, mass effect, or edema. No new loss of gray-white differentiation. Confluent areas of hypoattenuation in the supratentorial white matter are nonspecific but probably reflect similar advanced chronic microvascular ischemic changes. Probable chronic infarcts of bilateral occipital lobes. Prominence of the ventricles and sulci reflects similar parenchymal volume loss. There is no extra-axial fluid collection. Vascular: There is atherosclerotic calcification at the skull base. Skull: Calvarium is unremarkable. Sinuses/Orbits: No acute finding. Other: None. IMPRESSION: No acute intracranial abnormality. Stable chronic/nonemergent findings detailed above. Electronically Signed   By: Macy Mis M.D.   On: 10/15/2021 09:03   DG Chest Port 1  View  Result Date: 10/15/2021 CLINICAL DATA:  Dyspnea EXAM: PORTABLE CHEST  1 VIEW COMPARISON:  09/07/2021 chest radiograph. FINDINGS: Stable cardiomediastinal silhouette with mild cardiomegaly. No pneumothorax. Small bilateral pleural effusions, right greater than left. Hazy bibasilar lung opacities. Borderline mild pulmonary edema. IMPRESSION: 1. Borderline mild congestive heart failure. 2. Small bilateral pleural effusions, right greater than left. 3. Hazy bibasilar lung opacities, favor atelectasis. Electronically Signed   By: Ilona Sorrel M.D.   On: 10/15/2021 08:23    Procedures Procedures    Medications Ordered in ED Medications  vancomycin (VANCOREADY) IVPB 1500 mg/300 mL (has no administration in time range)  vancomycin (VANCOREADY) IVPB 750 mg/150 mL (has no administration in time range)  furosemide (LASIX) injection 80 mg (80 mg Intravenous Given 10/15/21 1012)  ceFEPIme (MAXIPIME) 2 g in sodium chloride 0.9 % 100 mL IVPB (2 g Intravenous New Bag/Given 10/15/21 1018)    ED Course/ Medical Decision Making/ A&P                           Medical Decision Making Problems Addressed: Acute respiratory failure with hypoxia and hypercapnia ([place]): acute illness or injury that poses a threat to life or bodily functions  Amount and/or Complexity of Data Reviewed External Data Reviewed: labs, ECG and notes. Labs: ordered. Decision-making details documented in ED Course. Radiology: ordered and independent interpretation performed. Decision-making details documented in ED Course. ECG/medicine tests: ordered and independent interpretation performed. Decision-making details documented in ED Course.  Risk Prescription drug management. Decision regarding hospitalization.  Critical Care Total time providing critical care: 30-74 minutes  Patient is a 88 year old female who presents with decline in mental status and hypoxia.  Initially she was fairly unresponsive on arrival but was reacting to  painful stimuli.  Chest x-ray showed some atelectasis versus infiltrate.  Her COVID test is positive but likely a remnant from recent infection.  Her lactate is elevated.  She was slightly hypothermic on arrival.  Given this, she was treated for potential sepsis with IV antibiotics.  Her chest x-ray also had showed some increase vascular congestion.  She was given dose of Lasix for this.  The chest x-ray was interpreted by me as well.  Urine does not appear to be infected.  Her head CT shows no acute abnormality.  On reexam, she was more alert and would answer some questions although still confused.  An ABG was performed which did show evidence of slight acidosis with hypercapnia.  She was started on BiPAP.  I spoke with the hospitalist who will admit the patient for further treatment.  Final Clinical Impression(s) / ED Diagnoses Final diagnoses:  Sepsis without acute organ dysfunction, due to unspecified organism [facility])  Acute respiratory failure with hypoxia and hypercapnia [facility])    Rx / DC Orders ED Discharge Orders     None         Malvin Johns, MD 10/15/21 1154

## 2021-10-15 NOTE — Assessment & Plan Note (Addendum)
Lives at assisted living facility.  Patient has advanced dementia.  PT/OT recommending skilled nursing facility

## 2021-10-16 DIAGNOSIS — I5032 Chronic diastolic (congestive) heart failure: Secondary | ICD-10-CM

## 2021-10-16 DIAGNOSIS — Z8616 Personal history of COVID-19: Secondary | ICD-10-CM

## 2021-10-16 DIAGNOSIS — Z7189 Other specified counseling: Secondary | ICD-10-CM

## 2021-10-16 DIAGNOSIS — G9340 Encephalopathy, unspecified: Secondary | ICD-10-CM

## 2021-10-16 DIAGNOSIS — J189 Pneumonia, unspecified organism: Secondary | ICD-10-CM

## 2021-10-16 DIAGNOSIS — J9621 Acute and chronic respiratory failure with hypoxia: Secondary | ICD-10-CM

## 2021-10-16 DIAGNOSIS — Y95 Nosocomial condition: Secondary | ICD-10-CM

## 2021-10-16 DIAGNOSIS — F03B0 Unspecified dementia, moderate, without behavioral disturbance, psychotic disturbance, mood disturbance, and anxiety: Secondary | ICD-10-CM

## 2021-10-16 DIAGNOSIS — Z515 Encounter for palliative care: Secondary | ICD-10-CM

## 2021-10-16 DIAGNOSIS — J9622 Acute and chronic respiratory failure with hypercapnia: Secondary | ICD-10-CM

## 2021-10-16 LAB — CBC
HCT: 31.9 % — ABNORMAL LOW (ref 36.0–46.0)
Hemoglobin: 10.2 g/dL — ABNORMAL LOW (ref 12.0–15.0)
MCH: 31.8 pg (ref 26.0–34.0)
MCHC: 32 g/dL (ref 30.0–36.0)
MCV: 99.4 fL (ref 80.0–100.0)
Platelets: 171 10*3/uL (ref 150–400)
RBC: 3.21 MIL/uL — ABNORMAL LOW (ref 3.87–5.11)
RDW: 14.5 % (ref 11.5–15.5)
WBC: 9.6 10*3/uL (ref 4.0–10.5)
nRBC: 0.2 % (ref 0.0–0.2)

## 2021-10-16 LAB — BASIC METABOLIC PANEL
Anion gap: 11 (ref 5–15)
BUN: 29 mg/dL — ABNORMAL HIGH (ref 8–23)
CO2: 33 mmol/L — ABNORMAL HIGH (ref 22–32)
Calcium: 9.3 mg/dL (ref 8.9–10.3)
Chloride: 92 mmol/L — ABNORMAL LOW (ref 98–111)
Creatinine, Ser: 1.43 mg/dL — ABNORMAL HIGH (ref 0.44–1.00)
GFR, Estimated: 35 mL/min — ABNORMAL LOW (ref >60)
Glucose, Bld: 93 mg/dL (ref 70–99)
Potassium: 3.4 mmol/L — ABNORMAL LOW (ref 3.5–5.1)
Sodium: 136 mmol/L (ref 135–145)

## 2021-10-16 LAB — I-STAT ARTERIAL BLOOD GAS, ED
Acid-Base Excess: 14 mmol/L — ABNORMAL HIGH (ref 0.0–2.0)
Bicarbonate: 40.9 mmol/L — ABNORMAL HIGH (ref 20.0–28.0)
Calcium, Ion: 1.32 mmol/L (ref 1.15–1.40)
HCT: 30 % — ABNORMAL LOW (ref 36.0–46.0)
Hemoglobin: 10.2 g/dL — ABNORMAL LOW (ref 12.0–15.0)
O2 Saturation: 95 %
Patient temperature: 97.6
Potassium: 3.7 mmol/L (ref 3.5–5.1)
Sodium: 137 mmol/L (ref 135–145)
TCO2: 43 mmol/L — ABNORMAL HIGH (ref 22–32)
pCO2 arterial: 65.7 mmHg (ref 32.0–48.0)
pH, Arterial: 7.4 (ref 7.350–7.450)
pO2, Arterial: 78 mmHg — ABNORMAL LOW (ref 83.0–108.0)

## 2021-10-16 LAB — BRAIN NATRIURETIC PEPTIDE: B Natriuretic Peptide: 343.9 pg/mL — ABNORMAL HIGH (ref 0.0–100.0)

## 2021-10-16 NOTE — Evaluation (Signed)
Occupational Therapy Evaluation Patient Details Name: Carrie Best MRN: [id number] DOB: 01-27-1928 Today's Date: 10/16/2021   History of Present Illness Carrie Best is a 88 y.o. female who presented with hypoxia, AMS and hypercarbia requiring BiPAP. Pt admitted for possible PNA, acute CHF, and acute encephalopathy. PMH: CHF, COIVD-19 (dec 2022), a fib, dementia, DM, HLD, and GERD, CVA.   Clinical Impression   PTA, pt from [facility] per chart with hx of assist needed for transfers to/from wheelchair at baseline. Pt A&Ox1, pleasant and follows one directions well. Pt overall Min A for UB ADLs, Total A for LB ADLs and likely close to ADL baseline. Pt able to sit EOB with Mod A at most, but unable to progress OOB due to pre-syncopal symptoms with resolution of symptoms once returned to supine. VSS on 5 L O2. Recommend SNF rehab at DC unless ALF staff able to provide the physical assist pt currently needs for ADLs/transfers. Will continue to follow acutely.      Recommendations for follow up therapy are one component of a multi-disciplinary discharge planning process, led by the attending physician.  Recommendations may be updated based on patient status, additional functional criteria and insurance authorization.   Follow Up Recommendations  Skilled nursing-short term rehab (<3 hours/day) (vs return to ALF if staff able to provide the physical assist needed)    Assistance Recommended at Discharge Frequent or constant Supervision/Assistance  Patient can return home with the following Two people to help with walking and/or transfers;A lot of help with bathing/dressing/bathroom    Functional Status Assessment  Patient has had a recent decline in their functional status and demonstrates the ability to make significant improvements in function in a reasonable and predictable amount of time.  Equipment Recommendations  Hospital bed    Recommendations for Other Services        Precautions / Restrictions Precautions Precautions: Fall;Other (comment) Precaution Comments: painful R knee, monitor O2 (2-4 L O2 baseline per chart) Restrictions Weight Bearing Restrictions: No      Mobility Bed Mobility Overal bed mobility: Needs Assistance Bed Mobility: Supine to Sit, Rolling Rolling: Min assist   Supine to sit: Mod assist     General bed mobility comments: Min A to min guard to fully roll for bed pan placement/removal, cues to reach to bedrail with good carryover    Transfers                   General transfer comment: unable to attempt due to pre-syncopal episode presentation      Balance Overall balance assessment: Needs assistance Sitting-balance support: No upper extremity supported, Feet supported Sitting balance-Leahy Scale: Fair                                     ADL either performed or assessed with clinical judgement   ADL Overall ADL's : Needs assistance/impaired Eating/Feeding: Set up;Sitting Eating/Feeding Details (indicate cue type and reason): able to reach for cup and drink from it Grooming: Minimal assistance;Sitting Grooming Details (indicate cue type and reason): demo skills to complete but after questionable syncopal episode sitting EOB, required increased assist to wash face Upper Body Bathing: Minimal assistance;Sitting   Lower Body Bathing: Maximal assistance;Sitting/lateral leans;Bed level   Upper Body Dressing : Minimal assistance;Sitting   Lower Body Dressing: Total assistance;Bed level;Sitting/lateral leans       Toileting- Clothing Manipulation and Hygiene: Total assistance;Bed  level Toileting - Clothing Manipulation Details (indicate cue type and reason): for peri care after BM via bed pan       General ADL Comments: Likely not far from functional baseline, follows one step commands consistently     Vision Baseline Vision/History: 1 Wears glasses Ability to See in Adequate Light: 0  Adequate Patient Visual Report: No change from baseline Vision Assessment?: No apparent visual deficits     Perception     Praxis      Pertinent Vitals/Pain Pain Assessment Pain Assessment: Faces Faces Pain Scale: Hurts a little bit Pain Location: R knee with initial bed mobility Pain Descriptors / Indicators: Grimacing Pain Intervention(s): Monitored during session, Limited activity within patient's tolerance     Hand Dominance Right   Extremity/Trunk Assessment Upper Extremity Assessment Upper Extremity Assessment: Generalized weakness   Lower Extremity Assessment Lower Extremity Assessment: Defer to PT evaluation   Cervical / Trunk Assessment Cervical / Trunk Assessment: Kyphotic   Communication Communication Communication: HOH   Cognition Arousal/Alertness: Awake/alert Behavior During Therapy: WFL for tasks assessed/performed Overall Cognitive Status: History of cognitive impairments - at baseline                                 General Comments: hx of dementia, able to state name and follow one step directions consistently, pleasant and agreeable. Did call out for gary x 2 during session (pt's son) but easily redirected after prompting that son was not present in room     General Comments  SpO2 92% on 5 L O2, sudden pre syncopal type episode sitting EOB but BP WFL. Flat affect, decreased responsiveness and L lateral lean. When returned to bed, pt alertness improved and answered appropriately though fatigued and quickly fell asleep    Exercises     Shoulder Instructions      Home Living Family/patient expects to be discharged to:: Assisted living                             Home Equipment: Wheelchair - manual   Additional Comments: [facility]      Prior Functioning/Environment Prior Level of Function : Needs assist;Patient poor historian/Family not available       Physical Assist : Mobility (physical);ADLs  (physical) Mobility (physical): Transfers;Gait;Stairs;Bed mobility ADLs (physical): Grooming;Bathing;Dressing;Toileting;IADLs Mobility Comments: Assisted transfers, non-ambulatory due to chronic right knee pain per 12/22 admission ADLs Comments: previous assisted by family prior to SNF rehab [place] after 12/22 admission. Pt likely receiving continued assist for ADLs at current ALF but unsure of levels of assist. Appears familiar with rolling side to side in bed for LB ADls        OT Problem List: Decreased strength;Decreased activity tolerance;Impaired balance (sitting and/or standing);Decreased cognition      OT Treatment/Interventions: Self-care/ADL training;Therapeutic exercise;Energy conservation;DME and/or AE instruction;Therapeutic activities;Patient/family education;Balance training    OT Goals(Current goals can be found in the care plan section) Acute Rehab OT Goals Patient Stated Goal: agreeable to sit EOB, asking for a cup of water (provided) OT Goal Formulation: With patient Time For Goal Achievement: 10/30/21 Potential to Achieve Goals: Good ADL Goals Pt Will Perform Grooming: with supervision;sitting Pt Will Perform Upper Body Bathing: with supervision;sitting Pt Will Perform Upper Body Dressing: with supervision;sitting Additional ADL Goal #1: Pt to demo bed mobility at Supervision in prep for ADL transfers Additional ADL Goal #2: Pt  to demo functional pivot transfers with Mod A to reduce caregiver burden  OT Frequency: Min 2X/week    Co-evaluation              AM-PAC OT "6 Clicks" Daily Activity     Outcome Measure Help from another person eating meals?: A Little Help from another person taking care of personal grooming?: A Little Help from another person toileting, which includes using toliet, bedpan, or urinal?: Total Help from another person bathing (including washing, rinsing, drying)?: A Lot Help from another person to put on and taking off regular upper body  clothing?: A Little Help from another person to put on and taking off regular lower body clothing?: Total 6 Click Score: 13   End of Session Equipment Utilized During Treatment: Oxygen Nurse Communication: Mobility status  Activity Tolerance: Patient tolerated treatment well Patient left: in bed;with call bell/phone within reach;with bed alarm set  OT Visit Diagnosis: Other abnormalities of gait and mobility (R26.89);Muscle weakness (generalized) (M62.81);Other symptoms and signs involving cognitive function                Time: [id number] OT Time Calculation (min): 28 min Charges:  OT General Charges $OT Visit: 1 Visit OT Evaluation $OT Eval Moderate Complexity: 1 Mod  Carrie Best, OTR/L Acute Rehab Services Office: [phone]   Carrie Best 10/16/2021, 1:04 PM

## 2021-10-16 NOTE — Hospital Course (Signed)
Carrie Best is a 88 y.o. female with medical history significant of diastolic congestive heart failure, recent COVID infection, paroxysmal A-fib on Xarelto, dementia, diabetes type 2, hyperlipidemia, GERD who was brought from [facility] facility after she was found to be hypoxic, more confused from her baseline.  Patient is chronically on 2-4 L of oxygen per minute at the nursing facility.  Today she was found to be saturating at 70% on 4 L and more confused than her baseline.  Patient has history of dementia and usually not oriented.  She was admitted here in January for management of sepsis secondary to bilateral pneumonia and was discharged to skilled facility on supplemental oxygen.  Patient has recent history of COVID diagnosed in December.  On presentation, she was hypoxic.  ABG showed hypercarbia. Chest x-ray done in the emergency department showed features of CHF, small bilateral pleural effusion, bilateral hazy opacities consistent with pneumonia versus atelectasis.  CT head did not show any acute intracranial abnormalities. Patient was admitted for the management of possible healthcare associated pneumonia, acute encephalopathy, acute on chronic diastolic congestive heart failure.

## 2021-10-16 NOTE — Evaluation (Signed)
Physical Therapy Evaluation Patient Details Name: Carrie Best MRN: [id number] DOB: [birth year] Today's Date: 10/16/2021  History of Present Illness  Carrie Best is a 88 y.o. female admitted from ALF on 10/15/21 with hypoxia, AMS and hypercarbia requiring BiPAP. Workup for possible PNA, acute CHF, and acute encephalopathy. PMH includes CHF, COIVD-19 (08/2021), a fib, dementia, DM, HLD, GERD, CVA.   Clinical Impression  Pt presents with an overall decrease in functional mobility secondary to above. Pt with dementia and poor historian; per chart, from ALF and non-ambulatory due to chronic knee pain. Today, pt pleasantly confused, following majority of simple commands with good initiation of mobility. OOB activity deferred due to brief episode of unresponsiveness (syncope?) while seated EOB; pt arousable return with return to supine, BP 125/50, SpO2 94%, HR 60s. Suspect pt would most benefit from return to familiar environment if ALF able to provide necessary assist; if not, may require SNF-level therapies to maximize functional mobility and decreased caregiver burden.    Recommendations for follow up therapy are one component of a multi-disciplinary discharge planning process, led by the attending physician.  Recommendations may be updated based on patient status, additional functional criteria and insurance authorization.  Follow Up Recommendations Skilled nursing-short term rehab (<3 hours/day) (vs. return to ALF if able to provide necessary assist)    Assistance Recommended at Discharge Frequent or constant Supervision/Assistance  Patient can return home with the following  A lot of help with walking and/or transfers;A lot of help with bathing/dressing/bathroom    Equipment Recommendations None recommended by PT  Recommendations for Other Services       Functional Status Assessment Patient has had a recent decline in their functional status and demonstrates the ability to make  significant improvements in function in a reasonable and predictable amount of time.     Precautions / Restrictions Precautions Precautions: Fall;Other (comment) Precaution Comments: Bladder/bowel incontinence; watch SpO2 (2-4L O2 baseline per chart); pre-syncopal/syncopal episode(??) during eval 2/10 - BP seemed stable Restrictions Weight Bearing Restrictions: No      Mobility  Bed Mobility Overal bed mobility: Needs Assistance Bed Mobility: Rolling, Supine to Sit, Sit to Supine Rolling: Min assist   Supine to sit: Mod assist, HOB elevated Sit to supine: Max assist, +2 for physical assistance   General bed mobility comments: ModA for trunk elevation and BLE management, pt initiating bed mobility well. pt with episode of unresponsiveness sitting EOB requiring maxA+2 for return to supine    Transfers                   General transfer comment: deferred due to pre-syncopal episode presentation while seated bedside    Ambulation/Gait                  Stairs            Wheelchair Mobility    Modified Rankin (Stroke Patients Only)       Balance Overall balance assessment: Needs assistance Sitting-balance support: No upper extremity supported, Feet supported Sitting balance-Leahy Scale: Fair                                       Pertinent Vitals/Pain Pain Assessment Pain Assessment: Faces Faces Pain Scale: Hurts a little bit Pain Location: R knee with initial bed mobility Pain Descriptors / Indicators: Grimacing Pain Intervention(s): Monitored during session, Limited activity within patient's tolerance  Home Living Family/patient expects to be discharged to:: Assisted living                 Home Equipment: Wheelchair - manual Additional Comments: Per chart, pt from [facility]    Prior Function Prior Level of Function : Needs assist;Patient poor historian/Family not available       Physical Assist : Mobility  (physical);ADLs (physical) Mobility (physical): Transfers;Gait;Stairs;Bed mobility ADLs (physical): Grooming;Bathing;Dressing;Toileting;IADLs Mobility Comments: Assisted transfers, non-ambulatory due to chronic right knee pain per 12/22 admission ADLs Comments: previous assisted by family prior to SNF rehab D/C after 12/22 admission. Pt likely receiving continued assist for ADLs at current ALF but unsure of levels of assist. Appears familiar with rolling side to side in bed for LB ADls     Hand Dominance   Dominant Hand: Right    Extremity/Trunk Assessment   Upper Extremity Assessment Upper Extremity Assessment: Generalized weakness    Lower Extremity Assessment Lower Extremity Assessment: Generalized weakness    Cervical / Trunk Assessment Cervical / Trunk Assessment: Kyphotic  Communication   Communication: HOH  Cognition Arousal/Alertness: Awake/alert, Lethargic Behavior During Therapy: WFL for tasks assessed/performed Overall Cognitive Status: History of cognitive impairments - at baseline                                 General Comments: h/o dementia; able to state name and follow majority of simple commands, pleasant and agreeable. Seems pleasantly confused. pt with episode of decreased repsonsiveness and eyes closed while sitting EOB requiring return to supine, arousable to voice but then continuing to close eyes (fatigue? pre-syncope?)        General Comments General comments (skin integrity, edema, etc.): SpO2 94% on O2 [place]; sudden pre syncopal type episode sitting EOB but BP 125/50. Flat affect, decreased responsiveness and L lateral lean. When returned to bed, pt alertness improved and answered appropriately though fatigued and quickly fell asleep    Exercises     Assessment/Plan    PT Assessment Patient needs continued PT services  PT Problem List Decreased strength;Decreased activity tolerance;Decreased balance;Decreased mobility;Decreased  cognition;Decreased knowledge of use of DME;Cardiopulmonary status limiting activity       PT Treatment Interventions DME instruction;Gait training;Functional mobility training;Therapeutic activities;Therapeutic exercise;Balance training;Patient/family education;Wheelchair mobility training    PT Goals (Current goals can be found in the Care Plan section)  Acute Rehab PT Goals PT Goal Formulation: Patient unable to participate in goal setting Time For Goal Achievement: 10/30/21 Potential to Achieve Goals: Fair    Frequency Min 2X/week     Co-evaluation               AM-PAC PT "6 Clicks" Mobility  Outcome Measure Help needed turning from your back to your side while in a flat bed without using bedrails?: A Lot Help needed moving from lying on your back to sitting on the side of a flat bed without using bedrails?: A Lot Help needed moving to and from a bed to a chair (including a wheelchair)?: A Lot Help needed standing up from a chair using your arms (e.g., wheelchair or bedside chair)?: A Lot Help needed to walk in hospital room?: Total Help needed climbing 3-5 steps with a railing? : Total 6 Click Score: 10    End of Session Equipment Utilized During Treatment: Gait belt Activity Tolerance: Patient tolerated treatment well;Treatment limited secondary to medical complications (Comment);Patient limited by fatigue Patient left: in  bed;with call bell/phone within reach;with bed alarm set Nurse Communication: Mobility status PT Visit Diagnosis: Other abnormalities of gait and mobility (R26.89);Muscle weakness (generalized) (M62.81)    Time: [id number] PT Time Calculation (min) (ACUTE ONLY): 17 min   Charges:   PT Evaluation $PT Eval Moderate Complexity: [place], PT, DPT Acute Rehabilitation Services  Pager [phone] Office [phone]  Derry Lory 10/16/2021, 2:24 PM

## 2021-10-16 NOTE — Consult Note (Signed)
Consultation Note Date: 10/16/2021   Patient Name: Carrie Best  DOB: 1927-11-19  MRN: [id number]  Age / Sex: 88 y.o., female  PCP: [name], MD Referring Physician: Shelly Coss, MD  Reason for Consultation: Establishing goals of care "Elderly patient with dementia and multiple co-morbidities presented again with acute hypoxic respirtory failure. Has been frequently admitted. Might be a candidate for hospice."  HPI/Patient Profile: 88 y.o. female  with past medical history of dementia, diastolic heart failure, recent COVID, chronic respiratory failure on 2-4 L of oxygen at baseline, paroxysmal A-fib on Xarelto, diabetes type 2, hyperlipidemia who presented to the emergency department from her assisted living facility on 10/15/2021 after she was found to be hypoxic and more confused than baseline. In the ED, ABG showed hypercarbia. Lab work showed mildly elevated troponin, mildly elevated lactic acid, and mild leukocytosis. UA was not suggestive of UTI. Chest x-ray showed features of CHF, small bilateral pleural effusion, and bilateral hazy opacities consistent with pneumonia versus atelectasis. CT negative for acute intracranial abnormalities.  Admitted to Hospitalist service for management of possible healthcare associated pneumonia, acute encephalopathy, and acute on chronic diastolic CHF.   [id number] - 09/01/21: Hospitalized at [facility] with acute on chronic hypoxic respiratory failure secondary to COVID infection/pneumonia 09/07/21 - 09/15/21 Hospitalize at [facility] with acute hypoxic respiratory failure, afib, hospital acquired pneumonia, discharged to ALF  Clinical Assessment and Goals of Care: I have reviewed medical records including EPIC notes, labs and imaging, and went to see patient at bedside. She is sleeping and was difficult to arouse on my assessment. However, I later checked with her RN who reports she  will wake up and has been eating well.   I spoke with her son Carrie Best and daughter-in-law Carrie Best by phone to discuss diagnosis, prognosis, GOC, EOL wishes, disposition, and options. I introduced Palliative Medicine as specialized medical care for people living with serious illness. It focuses on providing relief from the symptoms and stress of a serious illness.   We discussed a brief life review of the patient. [name] has lived her entire life in the [place] area. 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 They had 2 children, son Carrie Best and daughter Carrie Best. She is a widow - her husband passed away in 12/27/10.   In 2013-12-26, [name] had knee surgery after her patellar tendon ruptured. Family reports this was a major turning point for her as she essentially became unable to walk. She was however able to transfer to a wheelchair. After she had a stroke in December 27, 2018, she was no longer able to live in her home and subsequently came to live with Carrie Best and Carrie Best.   We discussed her current illness and what it means in the larger context of her ongoing co-morbidities.  Natural disease trajectory of chronic illness and debility was discussed. They report significant decline in her functional status since her COVID infection in December 2022.  She became progressively weaker, despite PT services at home which family reports did not really help. She ultimately went to an assisted living facility in [facility]).    The difference between full scope medical intervention and comfort care was considered.  I introduced the concept of a comfort path to family, emphasizing that this path involves de-escalating and stopping full scope medical interventions, allowing a natural course to occur. Discussed that the goal is comfort and dignity rather than prolonging life. Family confirms that [name] would not  want any aggressive or extraordinary interventions to prolong her life.   Family reports [name] has received excellent care at the assisted living facility. The owner has reassured them that will continue to care for [name] after discharge. I discussed with family my recommendation to consider the addition of hospice support at the facility. I explained that hospice would be extra care for patient, communication with family, and assistance with symptom management and care when she further declines. Family understands and agrees this would be appropriate.   Questions and concerns were addressed.  The family was encouraged to call with questions or concerns.    Primary decision maker: son Carrie Best and daughter-in-law Carrie Best. There is also a daughter Carrie Best), but family reports she has early onset dementia and is not able to participate in decision making.    SUMMARY OF RECOMMENDATIONS   Continue current supportive care Goal is discharge back to assisted living facility Family agrees to addition of hospice support at the facility PMT will continue to follow  Code Status/Advance Care Planning: DNR  Prognosis:  < 6 months  Discharge Planning: ALF with hospice      Primary Diagnoses: Present on Admission:  Acute respiratory failure with hypoxia (HCC)  Atrial fibrillation, chronic (HCC)  Chronic diastolic CHF (congestive heart failure) ([place])  Hyperlipidemia with target LDL less than 70  Hospital acquired PNA  Chronic renal disease, stage III ([place])  History of 2019 novel coronavirus disease (COVID-19)  Dementia without behavioral disturbance, psychotic disturbance, mood disturbance, or anxiety ([place])  Acute encephalopathy  Physical deconditioning   I have reviewed the medical record, interviewed the patient and family, and examined the patient. The following aspects are pertinent.  Past Medical History:  Diagnosis Date   Anxiety    Arthritis    Congestive heart failure (HCC)     Congestive heart failure symptoms   Depression    Diastolic dysfunction    Dyspnea    occasionally, with exertion   Dysrhythmia 05-10-12   hx. A. Fib., rate is controlled-tx. Xarelto   GERD (gastroesophageal reflux disease) 05-10-12   tx. with meds as needed   Hearing loss 05-10-12   bilateral hearing aids.   Heart murmur    History of kidney stones    Hyperlipidemia    Hypertension    Pneumonia 05-10-12   dx. in July- tx. antibiotics   Stroke ([place]) 05-10-12   Lt.CVA note on scans-hx. freq. falls/ none past 6 months 01/21/14   Wears hearing aid    bilateral    Family History  Problem Relation Age of Onset   Stroke Mother    Stroke Brother    Scheduled Meds:  apixaban  5 mg Oral BID   ascorbic acid  500 mg Oral Daily   atorvastatin  40 mg Oral Daily   diltiazem  180 mg Oral Daily   Ipratropium-Albuterol  1 puff Inhalation BID   isosorbide mononitrate  30 mg Oral Daily   metoprolol tartrate  50 mg Oral BID   torsemide  20 mg Oral QODAY   zinc sulfate  220 mg Oral Daily   Continuous Infusions:  ceFEPime (MAXIPIME) IV 2 g (10/16/21 1013)   PRN Meds:.guaiFENesin-dextromethorphan   Allergies  Allergen Reactions   Codeine Nausea And Vomiting   Elemental Sulfur Nausea And Vomiting   Penicillins Hives    Has patient had a PCN reaction causing immediate rash, facial/tongue/throat swelling, SOB or lightheadedness with hypotension: No Has patient had a PCN reaction causing severe rash involving mucus membranes or skin necrosis: No Has patient had a PCN reaction that required hospitalization No Has patient had a PCN reaction occurring within the last 10 years: No If all of the above answers are "NO", then may proceed with Cephalosporin use.    Review of Systems  Unable to perform ROS: Other   Physical Exam Vitals reviewed.  Constitutional:      General: She is sleeping. She is not in acute distress.    Comments: Frail, chronically ill-appearing  Pulmonary:     Effort:  Pulmonary effort is normal.    Vital Signs: BP 128/63 (BP Location: Left Arm)    Pulse 73    Temp 98.2 F (36.8 C) (Oral)    Resp 20    Ht 5\' 8"  (1.727 m)    Wt 65.5 kg    SpO2 93%    BMI 21.96 kg/m  Pain Scale: PAINAD       SpO2: SpO2: 93 % O2 Device:SpO2: 93 % O2 Flow Rate: .O2 Flow Rate (L/min): 4 L/min   LBM:   Baseline Weight: Weight: 65.5 kg Most recent weight: Weight: 65.5 kg      Palliative Assessment/Data: PPS 30-40%     Time Total: 76 minutes Greater than 50%  of this time was spent counseling and coordinating care related to the above assessment and plan.  Signed by: Lavena Bullion, NP   Please contact Palliative Medicine Team phone at [phone] for questions and concerns.  For individual provider: See Shea Evans

## 2021-10-16 NOTE — Progress Notes (Signed)
Pt not placed on Bi-PAP for the night. Pt seemed to be confused. And not ready to wear Bi-PAP. RT will continue to monitor as needed.

## 2021-10-16 NOTE — Progress Notes (Signed)
PROGRESS NOTE  Carrie Best  [id number] DOB: [birth year] DOA: 10/15/2021 PCP: [name], MD   Brief Narrative: Carrie Best is a 88 y.o. female with medical history significant of diastolic congestive heart failure, recent COVID infection, paroxysmal A-fib on Xarelto, dementia, diabetes type 2, hyperlipidemia, GERD who was brought from [facility] facility after she was found to be hypoxic, more confused from her baseline.  Patient is chronically on 2-4 L of oxygen per minute at the nursing facility.  Today she was found to be saturating at 70% on 4 L and more confused than her baseline.  Patient has history of dementia and usually not oriented.  She was admitted here in January for management of sepsis secondary to bilateral pneumonia and was discharged to skilled facility on supplemental oxygen.  Patient has recent history of COVID diagnosed in December.  On presentation, she was hypoxic.  ABG showed hypercarbia. Chest x-ray done in the emergency department showed features of CHF, small bilateral pleural effusion, bilateral hazy opacities consistent with pneumonia versus atelectasis.  CT head did not show any acute intracranial abnormalities. Patient was admitted for the management of possible healthcare associated pneumonia, acute encephalopathy, acute on chronic diastolic congestive heart failure.   Assessment & Plan:  Principal Problem:   Acute respiratory failure with hypoxia (HCC) Active Problems:   Chronic diastolic CHF (congestive heart failure) (HCC)   Acute encephalopathy   Hospital acquired PNA   Atrial fibrillation, chronic (HCC)   Hyperlipidemia with target LDL less than 70   Goals of care, counseling/discussion   Chronic renal disease, stage III ([place])   Dementia without behavioral disturbance, psychotic disturbance, mood disturbance, or anxiety (HCC)   History of 2019 novel coronavirus disease (COVID-19)   Physical deconditioning   Controlled type 2  diabetes mellitus without complication, without long-term current use of insulin (HCC)   Assessment and Plan: * Acute respiratory failure with hypoxia ([place])- (present on admission) Multifactorial secondary to possible pneumonia versus CHF exacerbation.   Uses oxygen at 2-4 liters at nursing facility.  She was admitted here in January this year for the management of bilateral pneumonia. She was found to be hypoxic on her usual requirement of oxygen. ABG showed hypercarbia,briefly  put on BiPAP. Repeat ABG shows improvement in the CO2 level, pH is normal so she is compensated.  She does not tolerate BiPAP.  Currently she is maintaining her saturation on 1 L.   Hospital acquired PNA- (present on admission) She was recently admitted here for pneumonia.  Chest x-ray showed bilateral hazy opacities suspicious for pneumonia versus atelectasis.On cefepime. Follow-up cultures  Acute encephalopathy- (present on admission) Likely secondary to metabolic encephalopathy from pneumonia, hypoxia.    Patient has  dementia.  Delirium precautions.Currently she is oriented to place only  Chronic diastolic CHF (congestive heart failure) ([place])- (present on admission) History of heart failure with preserved ejection fraction.  Chest x-ray showed features of CHF.  ABG showed elevated CO2.  On torsemide, metoprolol, isosorbide at home.  She was given a dose of Lasix 80 mg once in the emergency department. Elevated BNP.  Continue to monitor input/output, daily weight. Last echo done on 11/21 showed EF of 70 to 75%, indeterminate left ventricular diastolic parameters.Continue home torsemide.She currently looks euvolemic  Atrial fibrillation, chronic ([place])- (present on admission) Monitor on telemetry.  On Eliquis for anticoagulation .  Also on Cardizem and metoprolol for rate control.  Currently rate is well controlled.  Goals of care, counseling/discussion Multiple comorbidities  on patient with  dementia.  CODE  STATUS is DNR.  We will consult palliative care for goals of care.  If she does not improve, she might be a candidate for hospice  Hyperlipidemia with target LDL less than 70- (present on admission) On Lipitor 40 mg daily.  We will continue  Chronic renal disease, stage III ([place])- (present on admission) Currently kidney function at baseline.Monitor BMP  Controlled type 2 diabetes mellitus without complication, without long-term current use of insulin (HCC) Recent hemoglobin A1c of 6.1.  Currently not taking any medications.  Monitor blood sugars.  Physical deconditioning- (present on admission) Lives at assisted living facility.  Patient has advanced dementia.  Will request for PT/OT evaluation  History of 2019 novel coronavirus disease (COVID-19)- (present on admission) Recent history of COVID in December.  COVID screen test still positive.  No need of isolation or treatment for now.  Dementia without behavioral disturbance, psychotic disturbance, mood disturbance, or anxiety ([place])- (present on admission)  Takes BuSpar, duloxetine, risperidone.  Currently on hold due to increased confusion              DVT prophylaxis: apixaban (ELIQUIS) tablet 5 mg     Code Status: DNR  Family Communication: Called and discussed with daughter in law on phone on 2/10.  Patient status:Inpatient  Patient is from :ALF  Anticipated discharge to:SNF vs ALF  Estimated DC date:in 2-3 days   Consultants: Palliative care  Procedures: None  Antimicrobials:  Anti-infectives (From admission, onward)    Start     Dose/Rate Route Frequency Ordered Stop   10/16/21 1200  vancomycin (VANCOREADY) IVPB 750 mg/150 mL  Status:  Discontinued        750 mg 150 mL/hr over 60 Minutes Intravenous Every 24 hours 10/15/21 1058 10/16/21 1024   10/16/21 1100  ceFEPIme (MAXIPIME) 2 g in sodium chloride 0.9 % 100 mL IVPB        2 g 200 mL/hr over 30 Minutes Intravenous Every 24 hours 10/15/21 1202      10/15/21 1045  vancomycin (VANCOREADY) IVPB 1500 mg/300 mL        1,500 mg 150 mL/hr over 120 Minutes Intravenous  Once 10/15/21 1036 10/15/21 1411   10/15/21 1015  ceFEPIme (MAXIPIME) 2 g in sodium chloride 0.9 % 100 mL IVPB        2 g 200 mL/hr over 30 Minutes Intravenous  Once 10/15/21 1003 10/15/21 1139       Subjective:  Patient seen and examined at the bedside this morning.  Hemodynamically stable .  Lying in bed, confused but oriented to place only.  Not in any kind of distress.  Does not look volume overloaded  Objective: Vitals:   10/16/21 0635 10/16/21 0817 10/16/21 1007 10/16/21 1206  BP: (!) 155/59 (!) 155/56  128/63  Pulse: 86 76  73  Resp: 20 19  20   Temp: 98.7 F (37.1 C) 98.6 F (37 C)  98.2 F (36.8 C)  TempSrc: Oral Oral  Oral  SpO2: 91% 100%  93%  Weight:   65.5 kg   Height:   5\' 8"  (1.727 m)     Intake/Output Summary (Last 24 hours) at 10/16/2021 1250 Last data filed at 10/16/2021 0900 Gross per 24 hour  Intake 120 ml  Output 850 ml  Net -730 ml   Filed Weights   10/16/21 1007  Weight: 65.5 kg    Examination:  General exam: Very deconditioned, debilitated, elderly female HEENT: PERRL Respiratory system: Diminished  air entry bilaterally, no wheezes or crackles  Cardiovascular system: Irregularly irregular rhythm.  Gastrointestinal system: Abdomen is nondistended, soft and nontender. Central nervous system: Alert and awake, oriented to place only  Extremities: No edema, no clubbing ,no cyanosis Skin: No rashes, no ulcers,no icterus     Data Reviewed: I have personally reviewed following labs and imaging studies  CBC: Recent Labs  Lab 10/15/21 0806 10/15/21 0823 10/15/21 1024 10/16/21 0323 10/16/21 0531  WBC 10.7*  --   --  9.6  --   NEUTROABS 9.2*  --   --   --   --   HGB 12.3 13.6 11.6* 10.2* 10.2*  HCT 38.9 40.0 34.0* 31.9* 30.0*  MCV 100.3*  --   --  99.4  --   PLT 196  --   --  171  --    Basic Metabolic Panel: Recent Labs   Lab 10/15/21 0806 10/15/21 0823 10/15/21 1024 10/16/21 0323 10/16/21 0531  NA 134* 135 136 136 137  K 5.7* 4.3 4.3 3.4* 3.7  CL 92* 91*  --  92*  --   CO2 33*  --   --  33*  --   GLUCOSE 167* 165*  --  93  --   BUN 23 24*  --  29*  --   CREATININE 1.24* 1.20*  --  1.43*  --   CALCIUM 10.1  --   --  9.3  --      Recent Results (from the past 240 hour(s))  Culture, blood (routine x 2)     Status: None (Preliminary result)   Collection Time: 10/15/21  7:58 AM   Specimen: BLOOD LEFT FOREARM  Result Value Ref Range Status   Specimen Description BLOOD LEFT FOREARM  Final   Special Requests   Final    BOTTLES DRAWN AEROBIC AND ANAEROBIC Blood Culture results may not be optimal due to an inadequate volume of blood received in culture bottles   Culture   Final    NO GROWTH < 24 HOURS Performed at [facility], [address], [place], [place] [address]    Report Status PENDING  Incomplete  Resp Panel by RT-PCR (Flu A&B, Covid) Nasopharyngeal Swab     Status: Abnormal   Collection Time: 10/15/21  8:06 AM   Specimen: Nasopharyngeal Swab; Nasopharyngeal(NP) swabs in vial transport medium  Result Value Ref Range Status   SARS Coronavirus 2 by RT PCR POSITIVE (A) NEGATIVE Final    Comment: (NOTE) SARS-CoV-2 target nucleic acids are DETECTED.  The SARS-CoV-2 RNA is generally detectable in upper respiratory specimens during the acute phase of infection. Positive results are indicative of the presence of the identified virus, but do not rule out bacterial infection or co-infection with other pathogens not detected by the test. Clinical correlation with patient history and other diagnostic information is necessary to determine patient infection status. The expected result is Negative.  Fact Sheet for Patients: EntrepreneurPulse.com.au  Fact Sheet for Healthcare Providers: IncredibleEmployment.be  This test is not yet approved or cleared by  the [place] FDA and  has been authorized for detection and/or diagnosis of SARS-CoV-2 by FDA under an Emergency Use Authorization (EUA).  This EUA will remain in effect (meaning this test can be used) for the duration of  the COVID-19 declaration under Section 564(b)(1) of the A ct, 21 U.S.C. section 360bbb-3(b)(1), unless the authorization is terminated or revoked sooner.     Influenza A by PCR NEGATIVE NEGATIVE Final  Influenza B by PCR NEGATIVE NEGATIVE Final    Comment: (NOTE) The Xpert Xpress SARS-CoV-2/FLU/RSV plus assay is intended as an aid in the diagnosis of influenza from Nasopharyngeal swab specimens and should not be used as a sole basis for treatment. Nasal washings and aspirates are unacceptable for Xpert Xpress SARS-CoV-2/FLU/RSV testing.  Fact Sheet for Patients: EntrepreneurPulse.com.au  Fact Sheet for Healthcare Providers: IncredibleEmployment.be  This test is not yet approved or cleared by the [place] FDA and has been authorized for detection and/or diagnosis of SARS-CoV-2 by FDA under an Emergency Use Authorization (EUA). This EUA will remain in effect (meaning this test can be used) for the duration of the COVID-19 declaration under Section 564(b)(1) of the Act, 21 U.S.C. section 360bbb-3(b)(1), unless the authorization is terminated or revoked.  Performed at [facility], [place] [address] [place], [place] [address]   Culture, blood (routine x 2)     Status: None (Preliminary result)   Collection Time: 10/15/21  8:06 AM   Specimen: BLOOD  Result Value Ref Range Status   Specimen Description BLOOD RIGHT ANTECUBITAL  Final   Special Requests   Final    BOTTLES DRAWN AEROBIC AND ANAEROBIC Blood Culture adequate volume   Culture   Final    NO GROWTH < 24 HOURS Performed at [facility], [place] [address] [place], [place] [address]    Report Status PENDING  Incomplete  MRSA Next Gen by PCR, Nasal      Status: None   Collection Time: 10/15/21 12:38 PM   Specimen: Nasal Mucosa; Nasal Swab  Result Value Ref Range Status   MRSA by PCR Next Gen NOT DETECTED NOT DETECTED Final    Comment: (NOTE) The GeneXpert MRSA Assay (FDA approved for NASAL specimens only), is one component of a comprehensive MRSA colonization surveillance program. It is not intended to diagnose MRSA infection nor to guide or monitor treatment for MRSA infections. Test performance is not FDA approved in patients less than 34 years old. Performed at [facility], [place] [address] [place], [place] [address]      Radiology Studies: CT Head Wo Contrast  Result Date: 10/15/2021 CLINICAL DATA:  Mental status change, unknown cause EXAM: CT HEAD WITHOUT CONTRAST TECHNIQUE: Contiguous axial images were obtained from the base of the skull through the vertex without intravenous contrast. RADIATION DOSE REDUCTION: This exam was performed according to the departmental dose-optimization program which includes automated exposure control, adjustment of the mA and/or kV according to patient size and/or use of iterative reconstruction technique. COMPARISON:  06/19/2019 FINDINGS: Brain: There is no acute intracranial hemorrhage, mass effect, or edema. No new loss of gray-white differentiation. Confluent areas of hypoattenuation in the supratentorial white matter are nonspecific but probably reflect similar advanced chronic microvascular ischemic changes. Probable chronic infarcts of bilateral occipital lobes. Prominence of the ventricles and sulci reflects similar parenchymal volume loss. There is no extra-axial fluid collection. Vascular: There is atherosclerotic calcification at the skull base. Skull: Calvarium is unremarkable. Sinuses/Orbits: No acute finding. Other: None. IMPRESSION: No acute intracranial abnormality. Stable chronic/nonemergent findings detailed above. Electronically Signed   By: Macy Mis M.D.   On: 10/15/2021 09:03    DG Chest Port 1 View  Result Date: 10/15/2021 CLINICAL DATA:  Dyspnea EXAM: PORTABLE CHEST 1 VIEW COMPARISON:  09/07/2021 chest radiograph. FINDINGS: Stable cardiomediastinal silhouette with mild cardiomegaly. No pneumothorax. Small bilateral pleural effusions, right greater than left. Hazy bibasilar lung opacities. Borderline mild pulmonary edema. IMPRESSION: 1.  Borderline mild congestive heart failure. 2. Small bilateral pleural effusions, right greater than left. 3. Hazy bibasilar lung opacities, favor atelectasis. Electronically Signed   By: Ilona Sorrel M.D.   On: 10/15/2021 08:23    Scheduled Meds:  apixaban  5 mg Oral BID   ascorbic acid  500 mg Oral Daily   atorvastatin  40 mg Oral Daily   diltiazem  180 mg Oral Daily   Ipratropium-Albuterol  1 puff Inhalation BID   isosorbide mononitrate  30 mg Oral Daily   metoprolol tartrate  50 mg Oral BID   torsemide  20 mg Oral QODAY   zinc sulfate  220 mg Oral Daily   Continuous Infusions:  ceFEPime (MAXIPIME) IV 2 g (10/16/21 1013)     LOS: 1 day   Shelly Coss, MD [facility] P2/06/2022, 12:50 PM

## 2021-10-16 NOTE — Progress Notes (Signed)
Pt agreed to wear bipap. Placed pt on. No resp distress noted at this time. Will continue to monitor.

## 2021-10-16 NOTE — Progress Notes (Signed)
Pt arrived to unit accompanied by ER RN. Pt transferred to bed with 2 assist. RT placed BIPAP in room moment before pt arrived. Pt currently on 5L of supplemental O2 and sats are in high 90s. Pt denies any pain. She is disoriented x4. Skin check completed with 2nd RN. Call light within reach. Bed in low position. Pt oriented to unit with no evidence of learning. Safety measures intact.

## 2021-10-17 LAB — BASIC METABOLIC PANEL
Anion gap: 9 (ref 5–15)
BUN: 34 mg/dL — ABNORMAL HIGH (ref 8–23)
CO2: 34 mmol/L — ABNORMAL HIGH (ref 22–32)
Calcium: 8.9 mg/dL (ref 8.9–10.3)
Chloride: 92 mmol/L — ABNORMAL LOW (ref 98–111)
Creatinine, Ser: 1.68 mg/dL — ABNORMAL HIGH (ref 0.44–1.00)
GFR, Estimated: 29 mL/min — ABNORMAL LOW (ref >60)
Glucose, Bld: 126 mg/dL — ABNORMAL HIGH (ref 70–99)
Potassium: 4.5 mmol/L (ref 3.5–5.1)
Sodium: 135 mmol/L (ref 135–145)

## 2021-10-17 MED ORDER — APIXABAN 2.5 MG PO TABS
2.5000 mg | ORAL_TABLET | Freq: Two times a day (BID) | ORAL | Status: DC
  Administered 2021-10-17 (×2): 2.5 mg via ORAL
  Filled 2021-10-17 (×3): qty 1

## 2021-10-17 NOTE — Progress Notes (Signed)
PROGRESS NOTE  Carrie Best  [id number] DOB: 1928/06/04 DOA: 10/15/2021 PCP: [name], MD   Brief Narrative: Carrie Best is a 88 y.o. female with medical history significant of diastolic congestive heart failure, recent COVID infection, paroxysmal A-fib on Xarelto, dementia, diabetes type 2, hyperlipidemia, GERD who was brought from [facility] assisted living facility after she was found to be hypoxic, more confused from her baseline.  Patient is chronically on 2-4 L of oxygen per minute at the nursing facility.  Today she was found to be saturating at 70% on 4 L and more confused than her baseline.  Patient has history of dementia and usually not oriented.  She was admitted here in January for management of sepsis secondary to bilateral pneumonia and was discharged to skilled facility on supplemental oxygen.  Patient has recent history of COVID diagnosed in December.  On presentation, she was hypoxic.  ABG showed hypercarbia. Chest x-ray done in the emergency department showed features of CHF, small bilateral pleural effusion, bilateral hazy opacities consistent with pneumonia versus atelectasis.  CT head did not show any acute intracranial abnormalities. Patient was admitted for the management of possible healthcare associated pneumonia, acute encephalopathy, acute on chronic diastolic congestive heart failure.   Assessment & Plan:  Principal Problem:   Acute respiratory failure with hypoxia (HCC) Active Problems:   Chronic diastolic CHF (congestive heart failure) (HCC)   Acute encephalopathy   Hospital acquired PNA   Atrial fibrillation, chronic (HCC)   Hyperlipidemia with target LDL less than 70   Goals of care, counseling/discussion   Chronic renal disease, stage III ([place])   Dementia without behavioral disturbance, psychotic disturbance, mood disturbance, or anxiety (HCC)   History of 2019 novel coronavirus disease (COVID-19)   Physical deconditioning   Controlled type 2  diabetes mellitus without complication, without long-term current use of insulin (HCC)   Assessment and Plan: * Acute respiratory failure with hypoxia ([place])- (present on admission) Multifactorial secondary to possible pneumonia versus CHF exacerbation.   Uses oxygen at 2-4 liters at nursing facility.  She was admitted here in January this year for the management of bilateral pneumonia. She was found to be hypoxic on her usual requirement of oxygen. ABG showed hypercarbia,briefly  put on BiPAP. Repeat ABG shows improvement in the CO2 level, pH is normal so she is compensated.  She does not tolerate BiPAP.  Currently she is maintaining her saturation on 1 L.   Hospital acquired PNA- (present on admission) She was recently admitted here for pneumonia.  Chest x-ray showed bilateral hazy opacities suspicious for pneumonia versus atelectasis.On cefepime. Follow-up cultures, no growth till date.  Respiratory status stable  Acute encephalopathy- (present on admission) Likely secondary to metabolic encephalopathy from pneumonia, hypoxia.    Patient has  dementia.  Delirium precautions.Currently she is oriented to place only  Chronic diastolic CHF (congestive heart failure) ([place])- (present on admission) History of heart failure with preserved ejection fraction.  Chest x-ray showed features of CHF.  ABG showed elevated CO2.  On torsemide, metoprolol, isosorbide at home.  She was given a dose of Lasix 80 mg once in the emergency department. Elevated BNP.  Continue to monitor input/output, daily weight. Last echo done on 11/21 showed EF of 70 to 75%, indeterminate left ventricular diastolic parameters.  Takes torsemide at home which is currently on hold due to worsening kidney function.  Currently she looks euvolemic  Atrial fibrillation, chronic ([place])- (present on admission) Monitor on telemetry.  On Eliquis for anticoagulation .  Also on Cardizem and metoprolol for rate control.  Currently rate is well  controlled.  Goals of care, counseling/discussion Multiple comorbidities on patient with  dementia.  CODE STATUS is DNR.  We consulted palliative care.  Family wants to get her back to ALF/skilled nursing facility with hospice follow-up  Hyperlipidemia with target LDL less than 70- (present on admission) On Lipitor 40 mg daily.  We will continue  Chronic renal disease, stage III ([place])- (present on admission) Currently kidney function at baseline.Monitor BMP  Controlled type 2 diabetes mellitus without complication, without long-term current use of insulin (HCC) Recent hemoglobin A1c of 6.1.  Currently not taking any medications.  Monitor blood sugars.  Physical deconditioning- (present on admission) Lives at assisted living facility.  Patient has advanced dementia.  PT/OT recommending skilled nursing facility  History of 2019 novel coronavirus disease (COVID-19)- (present on admission) Recent history of COVID in December.  COVID screen test still positive.  No need of isolation or treatment for now.  Dementia without behavioral disturbance, psychotic disturbance, mood disturbance, or anxiety ([place])- (present on admission)  Takes BuSpar, duloxetine, risperidone.  Currently on hold due to increased confusion.  We will resume this on discharge              DVT prophylaxis:apixaban (ELIQUIS) tablet 2.5 mg Start: 10/17/21 1000 apixaban (ELIQUIS) tablet 2.5 mg     Code Status: DNR  Family Communication: Called and discussed with daughter in law on phone on 2/10.  Patient status:Inpatient  Patient is from :ALF  Anticipated discharge to:SNF vs ALF  Estimated DC date:in 1-2 days   Consultants: Palliative care  Procedures: None  Antimicrobials:  Anti-infectives (From admission, onward)    Start     Dose/Rate Route Frequency Ordered Stop   10/16/21 1200  vancomycin (VANCOREADY) IVPB 750 mg/150 mL  Status:  Discontinued        750 mg 150 mL/hr over 60 Minutes Intravenous  Every 24 hours 10/15/21 1058 10/16/21 1024   10/16/21 1100  ceFEPIme (MAXIPIME) 2 g in sodium chloride 0.9 % 100 mL IVPB        2 g 200 mL/hr over 30 Minutes Intravenous Every 24 hours 10/15/21 1202     10/15/21 1045  vancomycin (VANCOREADY) IVPB 1500 mg/300 mL        1,500 mg 150 mL/hr over 120 Minutes Intravenous  Once 10/15/21 1036 10/15/21 1411   10/15/21 1015  ceFEPIme (MAXIPIME) 2 g in sodium chloride 0.9 % 100 mL IVPB        2 g 200 mL/hr over 30 Minutes Intravenous  Once 10/15/21 1003 10/15/21 1139       Subjective:  Patient seen and examined at bedside this morning.  Hemodynamically stable.  On 3 L of oxygen which I weaned down to 1 L.  She was not in any kind of  respiratory distress.  Still looks confused but not agitated.  Overall looks comfortable  Objective: Vitals:   10/16/21 1936 10/17/21 0014 10/17/21 0459 10/17/21 0722  BP: (!) 112/51 (!) 98/47 (!) 116/52 (!) 162/54  Pulse: 77 65 60   Resp: 20     Temp: 98.5 F (36.9 C)  97.6 F (36.4 C) 97.8 F (36.6 C)  TempSrc: Oral   Axillary  SpO2: 99% 100% 99%   Weight:      Height:        Intake/Output Summary (Last 24 hours) at 10/17/2021 1056 Last data filed at 10/16/2021 2300 Gross per 24 hour  Intake  100 ml  Output 1 ml  Net 99 ml   Filed Weights   10/16/21 1007  Weight: 65.5 kg    Examination:   General exam: Very deconditioned, chronically ill looking, elderly female HEENT: PERRL Respiratory system: Diminished air entry bilaterally, mild bibasilar crackles  Cardiovascular system: S1 & S2 heard, RRR.  Gastrointestinal system: Abdomen is nondistended, soft and nontender. Central nervous system: Alert and awake, oriented to place only Extremities: No edema, no clubbing ,no cyanosis Skin: No rashes, no ulcers,no icterus     Data Reviewed: I have personally reviewed following labs and imaging studies  CBC: Recent Labs  Lab 10/15/21 0806 10/15/21 0823 10/15/21 1024 10/16/21 0323 10/16/21 0531   WBC 10.7*  --   --  9.6  --   NEUTROABS 9.2*  --   --   --   --   HGB 12.3 13.6 11.6* 10.2* 10.2*  HCT 38.9 40.0 34.0* 31.9* 30.0*  MCV 100.3*  --   --  99.4  --   PLT 196  --   --  171  --    Basic Metabolic Panel: Recent Labs  Lab 10/15/21 0806 10/15/21 0823 10/15/21 1024 10/16/21 0323 10/16/21 0531 10/17/21 0044  NA 134* 135 136 136 137 135  K 5.7* 4.3 4.3 3.4* 3.7 4.5  CL 92* 91*  --  92*  --  92*  CO2 33*  --   --  33*  --  34*  GLUCOSE 167* 165*  --  93  --  126*  BUN 23 24*  --  29*  --  34*  CREATININE 1.24* 1.20*  --  1.43*  --  1.68*  CALCIUM 10.1  --   --  9.3  --  8.9     Recent Results (from the past 240 hour(s))  Culture, blood (routine x 2)     Status: None (Preliminary result)   Collection Time: 10/15/21  7:58 AM   Specimen: BLOOD LEFT FOREARM  Result Value Ref Range Status   Specimen Description BLOOD LEFT FOREARM  Final   Special Requests   Final    BOTTLES DRAWN AEROBIC AND ANAEROBIC Blood Culture results may not be optimal due to an inadequate volume of blood received in culture bottles   Culture   Final    NO GROWTH 2 DAYS Performed at [facility], [address], [place], [place] [address]    Report Status PENDING  Incomplete  Resp Panel by RT-PCR (Flu A&B, Covid) Nasopharyngeal Swab     Status: Abnormal   Collection Time: 10/15/21  8:06 AM   Specimen: Nasopharyngeal Swab; Nasopharyngeal(NP) swabs in vial transport medium  Result Value Ref Range Status   SARS Coronavirus 2 by RT PCR POSITIVE (A) NEGATIVE Final    Comment: (NOTE) SARS-CoV-2 target nucleic acids are DETECTED.  The SARS-CoV-2 RNA is generally detectable in upper respiratory specimens during the acute phase of infection. Positive results are indicative of the presence of the identified virus, but do not rule out bacterial infection or co-infection with other pathogens not detected by the test. Clinical correlation with patient history and other diagnostic information is  necessary to determine patient infection status. The expected result is Negative.  Fact Sheet for Patients: EntrepreneurPulse.com.au  Fact Sheet for Healthcare Providers: IncredibleEmployment.be  This test is not yet approved or cleared by the [place] FDA and  has been authorized for detection and/or diagnosis of SARS-CoV-2 by FDA under an Emergency Use Authorization (EUA).  This EUA will remain in effect (meaning this test can be used) for the duration of  the COVID-19 declaration under Section 564(b)(1) of the A ct, 21 U.S.C. section 360bbb-3(b)(1), unless the authorization is terminated or revoked sooner.     Influenza A by PCR NEGATIVE NEGATIVE Final   Influenza B by PCR NEGATIVE NEGATIVE Final    Comment: (NOTE) The Xpert Xpress SARS-CoV-2/FLU/RSV plus assay is intended as an aid in the diagnosis of influenza from Nasopharyngeal swab specimens and should not be used as a sole basis for treatment. Nasal washings and aspirates are unacceptable for Xpert Xpress SARS-CoV-2/FLU/RSV testing.  Fact Sheet for Patients: EntrepreneurPulse.com.au  Fact Sheet for Healthcare Providers: IncredibleEmployment.be  This test is not yet approved or cleared by the [place] FDA and has been authorized for detection and/or diagnosis of SARS-CoV-2 by FDA under an Emergency Use Authorization (EUA). This EUA will remain in effect (meaning this test can be used) for the duration of the COVID-19 declaration under Section 564(b)(1) of the Act, 21 U.S.C. section 360bbb-3(b)(1), unless the authorization is terminated or revoked.  Performed at [facility], [place] [address], [place], [place] [address]   Culture, blood (routine x 2)     Status: None (Preliminary result)   Collection Time: 10/15/21  8:06 AM   Specimen: BLOOD  Result Value Ref Range Status   Specimen Description BLOOD RIGHT ANTECUBITAL  Final    Special Requests   Final    BOTTLES DRAWN AEROBIC AND ANAEROBIC Blood Culture adequate volume   Culture   Final    NO GROWTH 2 DAYS Performed at [facility], [place] [address], [place], [place] [address]    Report Status PENDING  Incomplete  MRSA Next Gen by PCR, Nasal     Status: None   Collection Time: 10/15/21 12:38 PM   Specimen: Nasal Mucosa; Nasal Swab  Result Value Ref Range Status   MRSA by PCR Next Gen NOT DETECTED NOT DETECTED Final    Comment: (NOTE) The GeneXpert MRSA Assay (FDA approved for NASAL specimens only), is one component of a comprehensive MRSA colonization surveillance program. It is not intended to diagnose MRSA infection nor to guide or monitor treatment for MRSA infections. Test performance is not FDA approved in patients less than 54 years old. Performed at [facility], [place] [address] [place],  [address]      Radiology Studies: No results found.  Scheduled Meds:  apixaban  2.5 mg Oral BID   ascorbic acid  500 mg Oral Daily   atorvastatin  40 mg Oral Daily   Ipratropium-Albuterol  1 puff Inhalation BID   zinc sulfate  220 mg Oral Daily   Continuous Infusions:  ceFEPime (MAXIPIME) IV 2 g (10/17/21 1020)     LOS: 2 days   Shelly Coss, MD [facility] P2/07/2022, 10:56 AM

## 2021-10-18 DIAGNOSIS — R5381 Other malaise: Secondary | ICD-10-CM

## 2021-10-18 LAB — BASIC METABOLIC PANEL
Anion gap: 11 (ref 5–15)
BUN: 33 mg/dL — ABNORMAL HIGH (ref 8–23)
CO2: 30 mmol/L (ref 22–32)
Calcium: 9.3 mg/dL (ref 8.9–10.3)
Chloride: 94 mmol/L — ABNORMAL LOW (ref 98–111)
Creatinine, Ser: 1.28 mg/dL — ABNORMAL HIGH (ref 0.44–1.00)
GFR, Estimated: 40 mL/min — ABNORMAL LOW (ref >60)
Glucose, Bld: 160 mg/dL — ABNORMAL HIGH (ref 70–99)
Potassium: 3.4 mmol/L — ABNORMAL LOW (ref 3.5–5.1)
Sodium: 135 mmol/L (ref 135–145)

## 2021-10-18 MED ORDER — APIXABAN 5 MG PO TABS
5.0000 mg | ORAL_TABLET | Freq: Two times a day (BID) | ORAL | Status: DC
  Administered 2021-10-18 – 2021-10-20 (×5): 5 mg via ORAL
  Filled 2021-10-18 (×5): qty 1

## 2021-10-18 MED ORDER — ISOSORBIDE MONONITRATE ER 30 MG PO TB24: 15.0000 mg | ORAL_TABLET | Freq: Every day | ORAL | Status: DC

## 2021-10-18 MED ORDER — DILTIAZEM HCL ER COATED BEADS 120 MG PO CP24
120.0000 mg | ORAL_CAPSULE | Freq: Every day | ORAL | Status: DC
  Administered 2021-10-18 – 2021-10-20 (×3): 120 mg via ORAL
  Filled 2021-10-18 (×3): qty 1

## 2021-10-18 MED ORDER — POTASSIUM CHLORIDE 20 MEQ PO PACK
40.0000 meq | PACK | Freq: Once | ORAL | Status: AC
  Administered 2021-10-18: 40 meq via ORAL
  Filled 2021-10-18: qty 2

## 2021-10-18 MED ORDER — ISOSORBIDE MONONITRATE ER 30 MG PO TB24
30.0000 mg | ORAL_TABLET | Freq: Every day | ORAL | Status: DC
  Administered 2021-10-18 – 2021-10-20 (×3): 30 mg via ORAL
  Filled 2021-10-18 (×3): qty 1

## 2021-10-18 MED ORDER — AMOXICILLIN-POT CLAVULANATE 875-125 MG PO TABS: 1.0000 | ORAL_TABLET | Freq: Two times a day (BID) | ORAL | 0 refills | Status: DC

## 2021-10-18 MED ORDER — DILTIAZEM HCL ER COATED BEADS 120 MG PO CP24: 120.0000 mg | ORAL_CAPSULE | Freq: Every day | ORAL | Status: AC

## 2021-10-18 MED ORDER — ISOSORBIDE MONONITRATE ER 30 MG PO TB24: 15.0000 mg | ORAL_TABLET | Freq: Every day | ORAL | Status: AC

## 2021-10-18 MED ORDER — METOPROLOL TARTRATE 12.5 MG HALF TABLET
12.5000 mg | ORAL_TABLET | Freq: Two times a day (BID) | ORAL | Status: DC
  Administered 2021-10-18 – 2021-10-20 (×5): 12.5 mg via ORAL
  Filled 2021-10-18 (×5): qty 1

## 2021-10-18 MED ORDER — DULOXETINE HCL 20 MG PO CPEP: 20.0000 mg | ORAL_CAPSULE | Freq: Every day | ORAL | Status: AC

## 2021-10-18 MED ORDER — METOPROLOL TARTRATE 25 MG PO TABS: 12.5000 mg | ORAL_TABLET | Freq: Two times a day (BID) | ORAL | Status: AC

## 2021-10-18 NOTE — Progress Notes (Signed)
Daily Progress Note   Patient Name: Carrie Best       Date: 10/18/2021 DOB: October 22, 1927  Age: 88 y.o. MRN#: [id number] Attending Physician: Shelly Coss, MD Primary Care Physician: [name], MD Admit Date: 10/15/2021    HPI/Patient Profile: 88 y.o. female  with past medical history of dementia, diastolic heart failure, recent COVID, chronic respiratory failure on 2-4 L of oxygen at baseline, paroxysmal A-fib on Xarelto, diabetes type 2, hyperlipidemia who presented to the emergency department from her assisted living facility on 10/15/2021 after she was found to be hypoxic and more confused than baseline. In the ED, ABG showed hypercarbia. Lab work showed mildly elevated troponin, mildly elevated lactic acid, and mild leukocytosis. UA was not suggestive of UTI. Chest x-ray showed features of CHF, small bilateral pleural effusion, and bilateral hazy opacities consistent with pneumonia versus atelectasis. CT negative for acute intracranial abnormalities.  Admitted to Hospitalist service for management of possible healthcare associated pneumonia, acute encephalopathy, and acute on chronic diastolic CHF.    [id number] - 09/01/21: Hospitalized at [facility] with acute on chronic hypoxic respiratory failure secondary to COVID infection/pneumonia 09/07/21 - 09/15/21 Hospitalized at [facility] with acute hypoxic respiratory failure, afib, hospital acquired pneumonia, discharged to ALF   Subjective: I spoke with son Carrie Best and daughter-in-law Carrie Best by phone. They have been notified that patient's ALF will not be able to take her back due to her care needs have increased. They have spoken with social work, who has informed them they will need to find a SNF for rehab.  Educational and counseling provided at length on the  issue of disposition as it relates to goals of care. Discussed that the goal of rehab is to improve functional status, and this is often challenging for patients with advanced illness. Family verbalizes understanding, and expresses concern that Carrie Best may not have much improvement with rehab.   Discussed that rehab is only temporary (usually 21 days are covered by insurance). If Carrie Best is able to improve her functional status, she may be able to return to ALF. However, if she does not improve functional status, family would need to bring her home or obtain long-term SNF placement.  Family confirms they are not able to care for Carrie Best at home.  Discussed that while patient is in rehab, she can be followed by  outpatient palliative. After she discharges from rehab, then she can transition to hospice care.  Explained that hospice care can be provided either at the ALF or at SNF (long-term). Family expresses understanding.   Objective:  Physical Exam Vitals reviewed.  Constitutional:      General: She is not in acute distress.    Comments: Chronically ill-appearing  Pulmonary:     Effort: Pulmonary effort is normal.  Neurological:     Mental Status: She is alert.  Psychiatric:        Cognition and Memory: Cognition is impaired.            Vital Signs: BP (!) 130/58 (BP Location: Left Arm)    Pulse 90    Temp 98.6 F (37 C) (Oral)    Resp 16    Ht 5\' 8"  (1.727 m)    Wt 65.5 kg    SpO2 91%    BMI 21.96 kg/m  SpO2: SpO2: 91 % O2 Device: O2 Device: Nasal Cannula O2 Flow Rate: O2 Flow Rate (L/min): 2 L/min   LBM:   Baseline Weight: Weight: 65.5 kg Most recent weight: Weight: 65.5 kg       Palliative Assessment/Data: PPS 30-40%      Palliative Care Assessment & Plan   Assessment: - acute respiratory failure with hypoxia - hospital acquired pneumonia - acute encephalopathy - chronic diastolic CHF - atrial fibrillation - chronic kidney disease, stage III - recent history of  COVID - dementia without behavioral disturbance  Recommendations/Plan: Continue current supportive care Plan is for SNF/rehab with outpatient palliative  Referral made for outpatient palliative - [facility] Goal is transition to rehab after rehab  Code Status: DNR/DNI   Prognosis:  < 6 months  Discharge Planning: [facility] for rehab with Palliative care service follow-up    Thank you for allowing the Palliative Medicine Team to assist in the care of this patient.  Total time: 52 minutes     Greater than 50%  of this time was spent counseling and coordinating care related to the above assessment and plan.  Lavena Bullion, NP  Please contact Palliative Medicine Team phone at [phone] for questions and concerns.

## 2021-10-18 NOTE — Discharge Summary (Addendum)
Physician Discharge Summary  Carrie Best [id number] DOB: [birth year] DOA: 10/15/2021  PCP: [name], MD  Admit date: 10/15/2021 Discharge date: 10/20/2021  Admitted From: ALF Disposition:  SNF  Discharge Condition:Stable CODE STATUS:FULL Diet recommendation: Heart Healthy  Brief/Interim Summary:  Carrie Best is a 88 y.o. female with medical history significant of diastolic congestive heart failure, recent COVID infection, paroxysmal A-fib on Xarelto, dementia, diabetes type 2, hyperlipidemia, GERD who was brought from [facility] assisted living facility after she was found to be hypoxic, more confused from her baseline.  Patient is chronically on 2-4 L of oxygen per minute at the nursing facility.  Today she was found to be saturating at 70% on 4 L and more confused than her baseline.  Patient has history of dementia and usually not oriented.  She was admitted here in January for management of sepsis secondary to bilateral pneumonia and was discharged to skilled facility on supplemental oxygen.  Patient has recent history of COVID diagnosed in December.  On presentation, she was hypoxic.  ABG showed hypercarbia. Chest x-ray done in the emergency department showed features of CHF, small bilateral pleural effusion, bilateral hazy opacities consistent with pneumonia versus atelectasis.  CT head did not show any acute intracranial abnormalities. Patient was admitted for the management of possible healthcare associated pneumonia, acute encephalopathy, acute on chronic diastolic congestive heart failure.  Overall status has significantly improved.  Currently her respiratory status is stable.  Mental status is at baseline.  PT/OT recommending skilled nursing facility on discharge.  She is medically stable for discharge to skilled nursing facility.  Following problems were addressed during her hospitalization:  Acute respiratory failure with hypoxia ([place])- (present on  admission) Multifactorial secondary to possible pneumonia versus CHF exacerbation.   Uses oxygen at 2-4 liters at nursing facility.  She was admitted here in January this year for the management of bilateral pneumonia. She was found to be hypoxic on her usual requirement of oxygen. ABG showed hypercarbia,briefly  put on BiPAP. Repeat ABG shows improvement in the CO2 level, pH is normal so she is compensated. She does not tolerate BiPAP.  Currently she is maintaining her saturation on 1 -2L.   Hospital acquired PNA- (present on admission) She was recently admitted here for pneumonia.  Chest x-ray showed bilateral hazy opacities suspicious for pneumonia versus atelectasis.She was on cefepime. Cutures: no growth till date.  Respiratory status stable.  Antibiotics changed to oral   Acute encephalopathy- (present on admission) Likely secondary to metabolic encephalopathy from pneumonia, hypoxia.  Patient has  dementia. Currently she is oriented to place only, at baseline.   Chronic diastolic CHF (congestive heart failure) ([place])- (present on admission) History of heart failure with preserved ejection fraction.  Chest x-ray showed features of CHF.  ABG showed elevated CO2.  On torsemide, metoprolol, isosorbide at home.  She was given a dose of Lasix 80 mg once in the emergency department. Elevated BNP. Last echo done on 11/21 showed EF of 70 to 75%, indeterminate left ventricular diastolic parameters.  Takes torsemide at home which will be continued.  Currently she looks euvolemic   Atrial fibrillation, chronic ([place])- (present on admission) Monitor on telemetry.  On Eliquis for anticoagulation .  Also on Cardizem and metoprolol for rate control.  Currently rate is well controlled.   Goals of care, counseling/discussion Multiple comorbidities on patient with  dementia.  CODE STATUS is DNR.  We consulted palliative care.  Palliative care recommended  t hospice follow-up at nursing  facilty.    Hyperlipidemia with target LDL less than 70- (present on admission) On Lipitor 40 mg daily.  We will continue   Chronic renal disease, stage III ([place])- (present on admission) Currently kidney function at baseline.Check BMP in a week   Controlled type 2 diabetes mellitus without complication, without long-term current use of insulin (HCC) Recent hemoglobin A1c of 6.1.  Currently not taking any medications.  Monitor blood sugars.   Physical deconditioning- (present on admission) Lives at assisted living facility.  Patient has advanced dementia.  PT/OT recommending skilled nursing facility   History of 2019 novel coronavirus disease (COVID-19)- (present on admission) Recent history of COVID in December.  COVID screen test still positive.  No need of isolation or treatment for now.   Dementia without behavioral disturbance, psychotic disturbance, mood disturbance, or anxiety ([place])- (present on admission)  Takes BuSpar, duloxetine, risperidone.  Restarted.  Dose of duloxetine reduced       Discharge Diagnoses:  Principal Problem:   Acute respiratory failure with hypoxia (HCC) Active Problems:   Chronic diastolic CHF (congestive heart failure) (HCC)   Acute encephalopathy   Hospital acquired PNA   Atrial fibrillation, chronic (HCC)   Hyperlipidemia with target LDL less than 70   Goals of care, counseling/discussion   Chronic renal disease, stage III ([place])   Dementia without behavioral disturbance, psychotic disturbance, mood disturbance, or anxiety (HCC)   History of 2019 novel coronavirus disease (COVID-19)   Physical deconditioning   Controlled type 2 diabetes mellitus without complication, without long-term current use of insulin [facility])    Discharge Instructions  Discharge Instructions     Diet - low sodium heart healthy   Complete by: As directed    Discharge instructions   Complete by: As directed    1)Please take prescribed medications as instructed 2)Do a CBC and BMP  tests in a week 2)Follow up with hospice services at SNF   Increase activity slowly   Complete by: As directed       Allergies as of 10/20/2021       Reactions   Codeine Nausea And Vomiting   Elemental Sulfur Nausea And Vomiting   Penicillins Hives   Has patient had a PCN reaction causing immediate rash, facial/tongue/throat swelling, SOB or lightheadedness with hypotension: No Has patient had a PCN reaction causing severe rash involving mucus membranes or skin necrosis: No Has patient had a PCN reaction that required hospitalization No Has patient had a PCN reaction occurring within the last 10 years: No If all of the above answers are "NO", then may proceed with Cephalosporin use.        Medication List     TAKE these medications    apixaban 5 MG Tabs tablet Commonly known as: ELIQUIS Take 1 tablet (5 mg total) by mouth 2 (two) times daily.   ascorbic acid 500 MG tablet Commonly known as: VITAMIN C Take 1 tablet (500 mg total) by mouth daily.   atorvastatin 40 MG tablet Commonly known as: LIPITOR Take 1 tablet (40 mg total) by mouth daily.   busPIRone 5 MG tablet Commonly known as: BUSPAR Take 2.5 mg by mouth 3 (three) times daily.   diltiazem 120 MG 24 hr capsule Commonly known as: CARDIZEM CD Take 1 capsule (120 mg total) by mouth daily. What changed:  medication strength how much to take   DULoxetine 20 MG capsule Commonly known as: CYMBALTA Take 1 capsule (20 mg total) by mouth daily. What changed:  medication strength  how much to take   guaiFENesin-dextromethorphan 100-10 MG/5ML syrup Commonly known as: ROBITUSSIN DM Take 10 mLs by mouth every 4 (four) hours as needed for cough.   Ipratropium-Albuterol 20-100 MCG/ACT Aers respimat Commonly known as: COMBIVENT Inhale 1 puff into the lungs 2 (two) times daily.   isosorbide mononitrate 30 MG 24 hr tablet Commonly known as: IMDUR Take 0.5 tablets (15 mg total) by mouth daily. What changed:  how  much to take how to take this when to take this additional instructions   levofloxacin 250 MG tablet Commonly known as: LEVAQUIN Take 1 tablet (250 mg total) by mouth daily for 2 days.   metoprolol tartrate 25 MG tablet Commonly known as: LOPRESSOR Take 0.5 tablets (12.5 mg total) by mouth 2 (two) times daily. What changed:  medication strength how much to take   polyethylene glycol 17 g packet Commonly known as: MIRALAX / GLYCOLAX Take 17 g by mouth daily.   Potassium Chloride ER 20 MEQ Tbcr Take 1 tablet by mouth daily. What changed: how much to take   risperiDONE 0.25 MG tablet Commonly known as: RISPERDAL Take 0.25-0.5 mg by mouth See admin instructions. Take 0.25mg  oral at 0800 and take 0.50mg  oral at 2000   torsemide 20 MG tablet Commonly known as: Demadex Take 1 tablet (20 mg total) by mouth every other day.   zinc sulfate 220 (50 Zn) MG capsule Take 1 capsule (220 mg total) by mouth daily.          Follow-up Information     [name], MD. Schedule an appointment as soon as possible for a visit in 1 week(s).   Specialty: Internal Medicine Contact information: [address], [address] [place] [address] [phone]                Allergies  Allergen Reactions   Codeine Nausea And Vomiting   Elemental Sulfur Nausea And Vomiting   Penicillins Hives    Has patient had a PCN reaction causing immediate rash, facial/tongue/throat swelling, SOB or lightheadedness with hypotension: No Has patient had a PCN reaction causing severe rash involving mucus membranes or skin necrosis: No Has patient had a PCN reaction that required hospitalization No Has patient had a PCN reaction occurring within the last 10 years: No If all of the above answers are "NO", then may proceed with Cephalosporin use.     Consultations: Palliative care   Procedures/Studies: CT Head Wo Contrast  Result Date: 10/15/2021 CLINICAL DATA:  Mental status change,  unknown cause EXAM: CT HEAD WITHOUT CONTRAST TECHNIQUE: Contiguous axial images were obtained from the base of the skull through the vertex without intravenous contrast. RADIATION DOSE REDUCTION: This exam was performed according to the departmental dose-optimization program which includes automated exposure control, adjustment of the mA and/or kV according to patient size and/or use of iterative reconstruction technique. COMPARISON:  06/19/2019 FINDINGS: Brain: There is no acute intracranial hemorrhage, mass effect, or edema. No new loss of gray-white differentiation. Confluent areas of hypoattenuation in the supratentorial white matter are nonspecific but probably reflect similar advanced chronic microvascular ischemic changes. Probable chronic infarcts of bilateral occipital lobes. Prominence of the ventricles and sulci reflects similar parenchymal volume loss. There is no extra-axial fluid collection. Vascular: There is atherosclerotic calcification at the skull base. Skull: Calvarium is unremarkable. Sinuses/Orbits: No acute finding. Other: None. IMPRESSION: No acute intracranial abnormality. Stable chronic/nonemergent findings detailed above. Electronically Signed   By: Macy Mis M.D.   On: 10/15/2021 09:03  DG Chest Port 1 View  Result Date: 10/15/2021 CLINICAL DATA:  Dyspnea EXAM: PORTABLE CHEST 1 VIEW COMPARISON:  09/07/2021 chest radiograph. FINDINGS: Stable cardiomediastinal silhouette with mild cardiomegaly. No pneumothorax. Small bilateral pleural effusions, right greater than left. Hazy bibasilar lung opacities. Borderline mild pulmonary edema. IMPRESSION: 1. Borderline mild congestive heart failure. 2. Small bilateral pleural effusions, right greater than left. 3. Hazy bibasilar lung opacities, favor atelectasis. Electronically Signed   By: Ilona Sorrel M.D.   On: 10/15/2021 08:23      Subjective: Patient seen and examined at the bedside this morning.  Hemodynamically stable.  On 2 L of  oxygen.  She looks much better today.  Alert and awake but oriented to place only.  Not in any kind of distress.Called and discussed wit daughter in law on phone today and discussed about dc planning  Discharge Exam: Vitals:   10/19/21 2200 10/20/21 0850  BP: 139/65 132/68  Pulse: 82 83  Resp: 18 18  Temp: 97.7 F (36.5 C) 97.8 F (36.6 C)  SpO2: 99% 95%   Vitals:   10/19/21 1606 10/19/21 1700 10/19/21 2200 10/20/21 0850  BP: (!) 127/52  139/65 132/68  Pulse: 76  82 83  Resp: 20  18 18   Temp: 98.3 F (36.8 C)  97.7 F (36.5 C) 97.8 F (36.6 C)  TempSrc: Oral  Oral Oral  SpO2: 96% 99% 99% 95%  Weight:      Height:        General: Pt is alert, awake, not in acute distress, very deconditioned elderly pleasant female Cardiovascular: Irregularly irregular rhythm, no rubs, no gallops Respiratory: CTA bilaterally, no wheezing, no rhonchi Abdominal: Soft, NT, ND, bowel sounds + Extremities: no edema, no cyanosis    The results of significant diagnostics from this hospitalization (including imaging, microbiology, ancillary and laboratory) are listed below for reference.     Microbiology: Recent Results (from the past 240 hour(s))  Culture, blood (routine x 2)     Status: None   Collection Time: 10/15/21  7:58 AM   Specimen: BLOOD LEFT FOREARM  Result Value Ref Range Status   Specimen Description BLOOD LEFT FOREARM  Final   Special Requests   Final    BOTTLES DRAWN AEROBIC AND ANAEROBIC Blood Culture results may not be optimal due to an inadequate volume of blood received in culture bottles   Culture   Final    NO GROWTH 5 DAYS Performed at [facility], [place] [address], [place], [place] [address]    Report Status 10/20/2021 FINAL  Final  Resp Panel by RT-PCR (Flu A&B, Covid) Nasopharyngeal Swab     Status: Abnormal   Collection Time: 10/15/21  8:06 AM   Specimen: Nasopharyngeal Swab; Nasopharyngeal(NP) swabs in vial transport medium  Result Value Ref Range Status    SARS Coronavirus 2 by RT PCR POSITIVE (A) NEGATIVE Final    Comment: (NOTE) SARS-CoV-2 target nucleic acids are DETECTED.  The SARS-CoV-2 RNA is generally detectable in upper respiratory specimens during the acute phase of infection. Positive results are indicative of the presence of the identified virus, but do not rule out bacterial infection or co-infection with other pathogens not detected by the test. Clinical correlation with patient history and other diagnostic information is necessary to determine patient infection status. The expected result is Negative.  Fact Sheet for Patients: EntrepreneurPulse.com.au  Fact Sheet for Healthcare Providers: IncredibleEmployment.be  This test is not yet approved or cleared by the [place] and  has been authorized for detection and/or diagnosis of SARS-CoV-2 by FDA under an Emergency Use Authorization (EUA).  This EUA will remain in effect (meaning this test can be used) for the duration of  the COVID-19 declaration under Section 564(b)(1) of the A ct, 21 U.S.C. section 360bbb-3(b)(1), unless the authorization is terminated or revoked sooner.     Influenza A by PCR NEGATIVE NEGATIVE Final   Influenza B by PCR NEGATIVE NEGATIVE Final    Comment: (NOTE) The Xpert Xpress SARS-CoV-2/FLU/RSV plus assay is intended as an aid in the diagnosis of influenza from Nasopharyngeal swab specimens and should not be used as a sole basis for treatment. Nasal washings and aspirates are unacceptable for Xpert Xpress SARS-CoV-2/FLU/RSV testing.  Fact Sheet for Patients: EntrepreneurPulse.com.au  Fact Sheet for Healthcare Providers: IncredibleEmployment.be  This test is not yet approved or cleared by the [place] FDA and has been authorized for detection and/or diagnosis of SARS-CoV-2 by FDA under an Emergency Use Authorization (EUA). This EUA will remain in effect  (meaning this test can be used) for the duration of the COVID-19 declaration under Section 564(b)(1) of the Act, 21 U.S.C. section 360bbb-3(b)(1), unless the authorization is terminated or revoked.  Performed at [facility], [place] [address] [place], [place] [address]   Culture, blood (routine x 2)     Status: None   Collection Time: 10/15/21  8:06 AM   Specimen: BLOOD  Result Value Ref Range Status   Specimen Description BLOOD RIGHT ANTECUBITAL  Final   Special Requests   Final    BOTTLES DRAWN AEROBIC AND ANAEROBIC Blood Culture adequate volume   Culture   Final    NO GROWTH 5 DAYS Performed at [facility], [place] [address], [place], [place] [address]    Report Status 10/20/2021 FINAL  Final  MRSA Next Gen by PCR, Nasal     Status: None   Collection Time: 10/15/21 12:38 PM   Specimen: Nasal Mucosa; Nasal Swab  Result Value Ref Range Status   MRSA by PCR Next Gen NOT DETECTED NOT DETECTED Final    Comment: (NOTE) The GeneXpert MRSA Assay (FDA approved for NASAL specimens only), is one component of a comprehensive MRSA colonization surveillance program. It is not intended to diagnose MRSA infection nor to guide or monitor treatment for MRSA infections. Test performance is not FDA approved in patients less than 28 years old. Performed at [facility], [place] [address] [place], [place] [address]      Labs: BNP (last 3 results) Recent Labs    08/27/21 1020 09/07/21 1537 10/16/21 0323  BNP 392.0* 237.0* 123456*   Basic Metabolic Panel: Recent Labs  Lab 10/15/21 0806 10/15/21 0823 10/15/21 1024 10/16/21 0323 10/16/21 0531 10/17/21 0044 10/18/21 0303 10/19/21 0022  NA 134* 135   < > 136 137 135 135 139  K 5.7* 4.3   < > 3.4* 3.7 4.5 3.4* 3.8  CL 92* 91*  --  92*  --  92* 94* 99  CO2 33*  --   --  33*  --  34* 30 32  GLUCOSE 167* 165*  --  93  --  126* 160* 149*  BUN 23 24*  --  29*  --  34* 33* 25*  CREATININE 1.24* 1.20*  --  1.43*  --  1.68*  1.28* 1.19*  CALCIUM 10.1  --   --  9.3  --  8.9 9.3 9.1   < > = values in  this interval not displayed.   Liver Function Tests: Recent Labs  Lab 10/15/21 0806  AST 39  ALT 8  ALKPHOS 97  BILITOT 2.1*  PROT 6.6  ALBUMIN 3.3*   No results for input(s): LIPASE, AMYLASE in the last 168 hours. No results for input(s): AMMONIA in the last 168 hours. CBC: Recent Labs  Lab 10/15/21 0806 10/15/21 0823 10/15/21 1024 10/16/21 0323 10/16/21 0531  WBC 10.7*  --   --  9.6  --   NEUTROABS 9.2*  --   --   --   --   HGB 12.3 13.6 11.6* 10.2* 10.2*  HCT 38.9 40.0 34.0* 31.9* 30.0*  MCV 100.3*  --   --  99.4  --   PLT 196  --   --  171  --    Cardiac Enzymes: No results for input(s): CKTOTAL, CKMB, CKMBINDEX, TROPONINI in the last 168 hours. BNP: Invalid input(s): POCBNP CBG: Recent Labs  Lab 10/15/21 0804  GLUCAP 143*   D-Dimer No results for input(s): DDIMER in the last 72 hours. Hgb A1c No results for input(s): HGBA1C in the last 72 hours. Lipid Profile No results for input(s): CHOL, HDL, LDLCALC, TRIG, CHOLHDL, LDLDIRECT in the last 72 hours. Thyroid function studies No results for input(s): TSH, T4TOTAL, T3FREE, THYROIDAB in the last 72 hours.  Invalid input(s): FREET3 Anemia work up No results for input(s): VITAMINB12, FOLATE, FERRITIN, TIBC, IRON, RETICCTPCT in the last 72 hours. Urinalysis    Component Value Date/Time   COLORURINE YELLOW 10/15/2021 0941   APPEARANCEUR CLEAR 10/15/2021 0941   LABSPEC 1.013 10/15/2021 0941   PHURINE 6.0 10/15/2021 0941   GLUCOSEU NEGATIVE 10/15/2021 0941   HGBUR NEGATIVE 10/15/2021 0941   BILIRUBINUR NEGATIVE 10/15/2021 0941   KETONESUR NEGATIVE 10/15/2021 0941   PROTEINUR NEGATIVE 10/15/2021 0941   UROBILINOGEN 0.2 02/07/2015 1534   NITRITE NEGATIVE 10/15/2021 0941   LEUKOCYTESUR NEGATIVE 10/15/2021 0941   Sepsis Labs Invalid input(s): PROCALCITONIN,  WBC,  LACTICIDVEN Microbiology Recent Results (from the past 240  hour(s))  Culture, blood (routine x 2)     Status: None   Collection Time: 10/15/21  7:58 AM   Specimen: BLOOD LEFT FOREARM  Result Value Ref Range Status   Specimen Description BLOOD LEFT FOREARM  Final   Special Requests   Final    BOTTLES DRAWN AEROBIC AND ANAEROBIC Blood Culture results may not be optimal due to an inadequate volume of blood received in culture bottles   Culture   Final    NO GROWTH 5 DAYS Performed at [facility], [address], [place], [place] [address]    Report Status 10/20/2021 FINAL  Final  Resp Panel by RT-PCR (Flu A&B, Covid) Nasopharyngeal Swab     Status: Abnormal   Collection Time: 10/15/21  8:06 AM   Specimen: Nasopharyngeal Swab; Nasopharyngeal(NP) swabs in vial transport medium  Result Value Ref Range Status   SARS Coronavirus 2 by RT PCR POSITIVE (A) NEGATIVE Final    Comment: (NOTE) SARS-CoV-2 target nucleic acids are DETECTED.  The SARS-CoV-2 RNA is generally detectable in upper respiratory specimens during the acute phase of infection. Positive results are indicative of the presence of the identified virus, but do not rule out bacterial infection or co-infection with other pathogens not detected by the test. Clinical correlation with patient history and other diagnostic information is necessary to determine patient infection status. The expected result is Negative.  Fact Sheet for Patients: BloggerCourse.com  Fact Sheet for Healthcare Providers:  IncredibleEmployment.be  This test is not yet approved or cleared by the [place] and  has been authorized for detection and/or diagnosis of SARS-CoV-2 by FDA under an Emergency Use Authorization (EUA).  This EUA will remain in effect (meaning this test can be used) for the duration of  the COVID-19 declaration under Section 564(b)(1) of the A ct, 21 U.S.C. section 360bbb-3(b)(1), unless the authorization is terminated or revoked  sooner.     Influenza A by PCR NEGATIVE NEGATIVE Final   Influenza B by PCR NEGATIVE NEGATIVE Final    Comment: (NOTE) The Xpert Xpress SARS-CoV-2/FLU/RSV plus assay is intended as an aid in the diagnosis of influenza from Nasopharyngeal swab specimens and should not be used as a sole basis for treatment. Nasal washings and aspirates are unacceptable for Xpert Xpress SARS-CoV-2/FLU/RSV testing.  Fact Sheet for Patients: EntrepreneurPulse.com.au  Fact Sheet for Healthcare Providers: IncredibleEmployment.be  This test is not yet approved or cleared by the [place] FDA and has been authorized for detection and/or diagnosis of SARS-CoV-2 by FDA under an Emergency Use Authorization (EUA). This EUA will remain in effect (meaning this test can be used) for the duration of the COVID-19 declaration under Section 564(b)(1) of the Act, 21 U.S.C. section 360bbb-3(b)(1), unless the authorization is terminated or revoked.  Performed at [facility], [place] [address] [place], [place] [address]   Culture, blood (routine x 2)     Status: None   Collection Time: 10/15/21  8:06 AM   Specimen: BLOOD  Result Value Ref Range Status   Specimen Description BLOOD RIGHT ANTECUBITAL  Final   Special Requests   Final    BOTTLES DRAWN AEROBIC AND ANAEROBIC Blood Culture adequate volume   Culture   Final    NO GROWTH 5 DAYS Performed at [facility], [place] [address] [place], [place] [address]    Report Status 10/20/2021 FINAL  Final  MRSA Next Gen by PCR, Nasal     Status: None   Collection Time: 10/15/21 12:38 PM   Specimen: Nasal Mucosa; Nasal Swab  Result Value Ref Range Status   MRSA by PCR Next Gen NOT DETECTED NOT DETECTED Final    Comment: (NOTE) The GeneXpert MRSA Assay (FDA approved for NASAL specimens only), is one component of a comprehensive MRSA colonization surveillance program. It is not intended to diagnose MRSA infection nor to  guide or monitor treatment for MRSA infections. Test performance is not FDA approved in patients less than 44 years old. Performed at [facility], [place] [address], [place], [place] [address]     Please note: You were cared for by a hospitalist during your hospital stay. Once you are discharged, your primary care physician will handle any further medical issues. Please note that NO REFILLS for any discharge medications will be authorized once you are discharged, as it is imperative that you return to your primary care physician (or establish a relationship with a primary care physician if you do not have one) for your post hospital discharge needs so that they can reassess your need for medications and monitor your lab values.    Time coordinating discharge: 40 minutes  SIGNED:   Shelly Coss, MD  [facility] 10/20/2021, 9:13 AM Pager [phone]  If 7PM-7AM, please contact night-coverage [email or web address] Password [email or web address]

## 2021-10-18 NOTE — TOC Progression Note (Addendum)
Transition of Care [facility]) - Progression Note    Patient Details  Name: CHYAN CARNERO MRN: [id number] Date of Birth: 1927/11/13  Transition of Care [facility]) CM/SW Contact  Levada Schilling Phone Number: 10/18/2021, 11:30 AM  Clinical Narrative:    CSW spoke with pt's ALF for pt's return.  Pt's ALF will not be able to meet pt's need currently and suggest pt receives acute care before returning to ALF.  CSW updated pt's family.  Pt's family will review SNFs from medicare.gov listings.  Verbal permission was give to CSW to send out in hub. Pt's family choices are [facility], [facility] and [facility].  CSW updated physician and RN.  TOC will continue to assist with disposition planning.   Expected Discharge Plan: Assisted Living Barriers to Discharge: Continued Medical Work up  Expected Discharge Plan and Services Expected Discharge Plan: Assisted Living In-house Referral: Clinical Social Work     Living arrangements for the past 2 months: Assisted Living Facility Expected Discharge Date: 10/18/21                                     Social Determinants of Health (SDOH) Interventions    Readmission Risk Interventions Readmission Risk Prevention Plan 09/09/2021 09/01/2021  Transportation Screening Complete Complete  PCP or Specialist Appt within 5-7 Days - Complete  Home Care Screening - Complete  Medication Review (RN CM) - Complete  HRI or Home Care Consult Complete -  Social Work Consult for Recovery Care Planning/Counseling Complete -  Palliative Care Screening Not Applicable -  Medication Review Oceanographer) Complete -  Some recent data might be hidden

## 2021-10-18 NOTE — TOC Initial Note (Addendum)
Transition of Care [facility]) - Initial/Assessment Note    Patient Details  Name: Carrie Best MRN: [id number] Date of Birth: May 17, 1928  [facility]) CM/SW Contact:    Gabrielle Dare Phone Number: 10/18/2021, 10:59 AM  Clinical Narrative:                 CSW spoke with pt's DIL by phone.  Pt is oriented to person only.  Pt's DIL lived with son and DIL  in a 2 story home with the pt's bedroom on the 1st level.  Pt is currently living at [facility] since January 10th.  Pt's daughter -in law is agreeable for pt to return to ALF at discharge.  TOC will continue to assist with disposition.  Expected Discharge Plan: Assisted Living Barriers to Discharge: Continued Medical Work up   Patient Goals and CMS Choice Patient states their goals for this hospitalization and ongoing recovery are:: DIL states " live rest of days pain free and at peace".      Expected Discharge Plan and Services Expected Discharge Plan: Assisted Living In-house Referral: Clinical Social Work     Living arrangements for the past 2 months: [place] Expected Discharge Date: 10/18/21                                    Prior Living Arrangements/Services Living arrangements for the past 2 months: [place] Lives with:: Self Patient language and need for interpreter reviewed:: Yes Do you feel safe going back to the place where you live?: Yes      Need for Family Participation in Patient Care: Yes (Comment) Care giver support system in place?: Yes (comment)   Criminal Activity/Legal Involvement Pertinent to Current Situation/Hospitalization: No - Comment as needed  Activities of Daily Living Home Assistive Devices/Equipment: Hearing aid ADL Screening (condition at time of admission) Patient's cognitive ability adequate to safely complete daily activities?: No Is the patient deaf or have difficulty hearing?: Yes Does the patient have  difficulty seeing, even when wearing glasses/contacts?: No Does the patient have difficulty concentrating, remembering, or making decisions?: Yes Patient able to express need for assistance with ADLs?: No Does the patient have difficulty dressing or bathing?: Yes Independently performs ADLs?: No Does the patient have difficulty walking or climbing stairs?: Yes Weakness of Legs: Both Weakness of Arms/Hands: Both  Permission Sought/Granted Permission sought to share information with : Case Manager, Customer service manager, Family Supports Permission granted to share information with : Yes, Verbal Permission Granted  Share Information with NAME: Cera Pfost [phone]  Permission granted to share info w AGENCY: Yes  Permission granted to share info w Relationship: Trivia [place] and Asako Allegro [phone]  Permission granted to share info w Contact Information: Yes  Emotional Assessment Appearance:: Appears stated age Attitude/Demeanor/Rapport: Unable to Assess (Oriented to person only) Affect (typically observed): Unable to Assess (Oriented to person only) Orientation: : Oriented to Self Alcohol / Substance Use: Not Applicable Psych Involvement: No (comment)  Admission diagnosis:  Acute respiratory failure with hypoxia (HCC) [J96.01] Acute respiratory failure with hypoxia and hypercapnia (HCC) [J96.01, J96.02] Sepsis without acute organ dysfunction, due to unspecified organism [facility]) [A41.9] Patient Active Problem List   Diagnosis Date Noted   Acute respiratory failure with hypoxia ([place]) 10/15/2021   Goals of care, counseling/discussion 10/15/2021   Controlled type 2 diabetes mellitus without complication, without long-term current  use of insulin ([place]) 10/15/2021   Physical deconditioning    Acute hypoxemic respiratory failure ([place]) 09/07/2021   Pleural effusion    Dementia without behavioral disturbance, psychotic disturbance, mood disturbance, or anxiety ([place])     History of 2019 novel coronavirus disease (COVID-19)    Sepsis without acute organ dysfunction ([place])    Prediabetes    Weakness    COVID-19 virus infection 08/27/2021   Chronic renal disease, stage III ([place]) 08/27/2021   Acute on chronic heart failure with preserved ejection fraction (HFpEF) ([place]) 07/25/2020   Acute exacerbation of CHF (congestive heart failure) ([place]) 07/24/2020   Constipation 07/24/2020   Leukocytosis 07/24/2020   Elevated brain natriuretic peptide (BNP) level 07/24/2020   Hospital acquired PNA 07/24/2020   OSA (obstructive sleep apnea) 07/10/2020   Nocturnal hypoxia 07/10/2020   Acute confusion 07/10/2020   CVA (cerebral vascular accident) ([place]) 06/19/2019   Acute encephalopathy 06/18/2019   Acute on chronic respiratory failure with hypoxia ([place]) 09/10/2018   Chronic kidney disease (CKD), stage II (mild) 09/10/2018   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   CHF exacerbation ([place]) 09/09/2018   ARF (acute renal failure) ([place]) 07/10/2018   Nausea & vomiting 07/09/2018   Hypokalemia 12/25/2017   Acute on chronic diastolic CHF (congestive heart failure) ([place]) 12/24/2017   Failed total knee arthroplasty ([place]) 05/19/2016   Rupture of right patellar tendon 05/19/2016   H/O nonunion of fracture 03/16/2016   Arthritis of knee 02/17/2015   Primary osteoarthritis of right knee Valgus 02/14/2015   Chronic diastolic CHF (congestive heart failure) ([place]) 02/06/2015   Hyperglycemia 01/28/2015   Proximal humerus fracture 06/10/2014   Obesity 03/18/2012   HTN (hypertension) 03/18/2012   GERD (gastroesophageal reflux disease) 03/18/2012   Hyperlipidemia with target LDL less than 70 03/18/2012   Sleep disturbance 03/18/2012   Atrial fibrillation, chronic ([place]) 03/18/2012   PCP:  [name], MD Pharmacy:   [facility] - [place], [place] - [address] [place] [address] Phone: [phone] Fax:  [phone]     Social Determinants of Health (SDOH) Interventions    Readmission Risk Interventions Readmission Risk Prevention Plan 09/09/2021 09/01/2021  Transportation Screening Complete Complete  PCP or Specialist Appt within 5-7 Days - Complete  Home Care Screening - Complete  Medication Review (RN CM) - Complete  HRI or Home Care Consult Complete -  Social Work Consult for [place] Planning/Counseling Complete -  Palliative Care Screening Not Applicable -  Medication Review (RN Care Manager) Complete -  Some recent data might be hidden

## 2021-10-18 NOTE — Progress Notes (Signed)
Patient continues on 2L [place]. No distress noted at this time. BIPAP on standy by.

## 2021-10-18 NOTE — NC FL2 (Signed)
[place] LEVEL OF CARE SCREENING TOOL     IDENTIFICATION  Patient Name: Carrie Best Birthdate: [birth year] Sex: female Admission Date (Current Location): 10/15/2021  [facility] and [place] Number:  Herbalist and Address:  [facility], [place] [address] [place], [place] [address]      Provider Number: [id number]  Attending Physician Name and Address:  Shelly Coss, MD  Relative Name and Phone Number:       Current Level of Care: Hospital Recommended Level of Care: [place] Prior Approval Number:    Date Approved/Denied:   PASRR Number: [id number]  Discharge Plan: SNF    Current Diagnoses: Patient Active Problem List   Diagnosis Date Noted   Acute respiratory failure with hypoxia ([place]) 10/15/2021   Goals of care, counseling/discussion 10/15/2021   Controlled type 2 diabetes mellitus without complication, without long-term current use of insulin ([place]) 10/15/2021   Physical deconditioning    Acute hypoxemic respiratory failure ([place]) 09/07/2021   Pleural effusion    Dementia without behavioral disturbance, psychotic disturbance, mood disturbance, or anxiety ([place])    History of 2019 novel coronavirus disease (COVID-19)    Sepsis without acute organ dysfunction ([place])    Prediabetes    Weakness    COVID-19 virus infection 08/27/2021   Chronic renal disease, stage III ([place]) 08/27/2021   Acute on chronic heart failure with preserved ejection fraction (HFpEF) ([place]) 07/25/2020   Acute exacerbation of CHF (congestive heart failure) ([place]) 07/24/2020   Constipation 07/24/2020   Leukocytosis 07/24/2020   Elevated brain natriuretic peptide (BNP) level 07/24/2020   Hospital acquired PNA 07/24/2020   OSA (obstructive sleep apnea) 07/10/2020   Nocturnal hypoxia 07/10/2020   Acute confusion 07/10/2020   CVA (cerebral vascular accident) ([place]) 06/19/2019   Acute encephalopathy 06/18/2019   Acute on chronic  respiratory failure with hypoxia ([place]) 09/10/2018   Chronic kidney disease (CKD), stage II (mild) 09/10/2018   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   CHF exacerbation ([place]) 09/09/2018   ARF (acute renal failure) ([place]) 07/10/2018   Nausea & vomiting 07/09/2018   Hypokalemia 12/25/2017   Acute on chronic diastolic CHF (congestive heart failure) ([place]) 12/24/2017   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   Rupture of right patellar tendon 05/19/2016   H/O nonunion of fracture 03/16/2016   Arthritis of knee 02/17/2015   Primary osteoarthritis of right knee Valgus 02/14/2015   Chronic diastolic CHF (congestive heart failure) ([place]) 02/06/2015   Hyperglycemia 01/28/2015   Proximal humerus fracture 06/10/2014   Obesity 03/18/2012   HTN (hypertension) 03/18/2012   GERD (gastroesophageal reflux disease) 03/18/2012   Hyperlipidemia with target LDL less than 70 03/18/2012   Sleep disturbance 03/18/2012   Atrial fibrillation, chronic ([place]) 03/18/2012    Orientation RESPIRATION BLADDER Height & Weight     Self  O2 External catheter, Incontinent Weight: 144 lb 6.4 oz (65.5 kg) Height:  5\' 8"  (172.7 cm)  BEHAVIORAL SYMPTOMS/MOOD NEUROLOGICAL BOWEL NUTRITION STATUS      Incontinent Diet (please see discharge summary)  AMBULATORY STATUS COMMUNICATION OF NEEDS Skin     Verbally Other (Comment) (ecchymosis RT and LF arm and leg)                       Personal Care Assistance Level of Assistance  Bathing, Feeding, Dressing Bathing Assistance: Maximum assistance Feeding assistance: Independent Dressing Assistance: Maximum assistance     Functional Limitations Info  Sight, Hearing, Speech Sight  Info: Adequate Hearing Info: Impaired Speech Info: Impaired (difficulty speaking)    SPECIAL CARE FACTORS FREQUENCY  PT (By licensed PT), OT (By licensed OT)     PT Frequency: 5x per week OT Frequency: 5x per week            Contractures Contractures Info: Not present     Additional Factors Info  Code Status, Allergies Code Status Info: DNR Allergies Info: Codeine,Elemental Sulfur,Penicillins           Current Medications (10/18/2021):  This is the current hospital active medication list Current Facility-Administered Medications  Medication Dose Route Frequency Provider Last Rate Last Admin   apixaban (ELIQUIS) tablet 5 mg  5 mg Oral BID Reome, Earle J, RPH   5 mg at 10/18/21 0848   ascorbic acid (VITAMIN C) tablet 500 mg  500 mg Oral Daily Adhikari, Amrit, MD   500 mg at 10/18/21 0840   atorvastatin (LIPITOR) tablet 40 mg  40 mg Oral Daily Adhikari, Amrit, MD   40 mg at 10/18/21 0839   ceFEPIme (MAXIPIME) 2 g in sodium chloride 0.9 % 100 mL IVPB  2 g Intravenous Q24H Bertis Ruddy, RPH 200 mL/hr at 10/18/21 1029 2 g at 10/18/21 1029   diltiazem (CARDIZEM CD) 24 hr capsule 120 mg  120 mg Oral Daily Adhikari, Amrit, MD   120 mg at 10/18/21 1021   guaiFENesin-dextromethorphan (ROBITUSSIN DM) 100-10 MG/5ML syrup 10 mL  10 mL Oral Q4H PRN Adhikari, Tamsen Meek, MD       Ipratropium-Albuterol (COMBIVENT) respimat 1 puff  1 puff Inhalation BID Shelly Coss, MD   1 puff at 10/18/21 0815   isosorbide mononitrate (IMDUR) 24 hr tablet 30 mg  30 mg Oral Daily Reome, Earle J, RPH   30 mg at 10/18/21 1021   metoprolol tartrate (LOPRESSOR) tablet 12.5 mg  12.5 mg Oral BID Shelly Coss, MD   12.5 mg at 10/18/21 1021   zinc sulfate capsule 220 mg  220 mg Oral Daily Shelly Coss, MD   220 mg at 10/18/21 0840     Discharge Medications: Please see discharge summary for a list of discharge medications.  Relevant Imaging Results:  Relevant Lab Results:   Additional Information SSN [id number]  Vinie Sill, LCSW

## 2021-10-19 LAB — BASIC METABOLIC PANEL
Anion gap: 8 (ref 5–15)
BUN: 25 mg/dL — ABNORMAL HIGH (ref 8–23)
CO2: 32 mmol/L (ref 22–32)
Calcium: 9.1 mg/dL (ref 8.9–10.3)
Chloride: 99 mmol/L (ref 98–111)
Creatinine, Ser: 1.19 mg/dL — ABNORMAL HIGH (ref 0.44–1.00)
GFR, Estimated: 43 mL/min — ABNORMAL LOW (ref >60)
Glucose, Bld: 149 mg/dL — ABNORMAL HIGH (ref 70–99)
Potassium: 3.8 mmol/L (ref 3.5–5.1)
Sodium: 139 mmol/L (ref 135–145)

## 2021-10-19 MED ORDER — AMOXICILLIN-POT CLAVULANATE 875-125 MG PO TABS: 1.0000 | ORAL_TABLET | Freq: Two times a day (BID) | ORAL | Status: DC

## 2021-10-19 MED ORDER — LEVOFLOXACIN 500 MG PO TABS
250.0000 mg | ORAL_TABLET | Freq: Every day | ORAL | Status: DC
  Administered 2021-10-20: 250 mg via ORAL
  Filled 2021-10-19: qty 1

## 2021-10-19 MED ORDER — IPRATROPIUM-ALBUTEROL 20-100 MCG/ACT IN AERS
1.0000 | INHALATION_SPRAY | Freq: Two times a day (BID) | RESPIRATORY_TRACT | Status: DC | PRN
  Filled 2021-10-19: qty 4

## 2021-10-19 MED ORDER — LEVOFLOXACIN 500 MG PO TABS: 500.0000 mg | ORAL_TABLET | Freq: Every day | ORAL | 0 refills | Status: DC

## 2021-10-19 MED ORDER — LEVOFLOXACIN 500 MG PO TABS
500.0000 mg | ORAL_TABLET | Freq: Once | ORAL | Status: AC
  Administered 2021-10-19: 500 mg via ORAL
  Filled 2021-10-19: qty 1

## 2021-10-19 MED ORDER — LEVOFLOXACIN 500 MG PO TABS: 500.0000 mg | ORAL_TABLET | Freq: Every day | ORAL | Status: DC

## 2021-10-19 MED ORDER — LEVOFLOXACIN 500 MG PO TABS
250.0000 mg | ORAL_TABLET | Freq: Every day | ORAL | Status: DC
Start: 2021-10-19 — End: 2021-10-19

## 2021-10-19 MED ORDER — LEVOFLOXACIN 250 MG PO TABS: 250.0000 mg | ORAL_TABLET | Freq: Every day | ORAL | 0 refills | Status: AC

## 2021-10-19 NOTE — TOC Progression Note (Addendum)
Transition of Care [facility]) - Progression Note    Patient Details  Name: BRIYANNA BILLINGHAM MRN: [id number] Date of Birth: [birth year]  Transition of Care [facility]) CM/SW Contact  Lorri Frederick, LCSW Phone Number: 10/19/2021, 10:19 AM  Clinical Narrative:   Pt oriented x1, hard of hearing, CSW unable to communicate with her regarding bed offers.  CSW spoke with daughter in law Debbie and provided bed offers.  She chose [facility]. Confirming with [facility] that they can take today.  Pending insurance auth.  1115: CSW spoke with [facility], she can accept pt today.  Waiting on new PT note to initiate insurance auth. PT aware and will see pt.   1315: PT note in, auth submitted in [place].  1520: Auth still pending in [place].    Expected Discharge Plan: Assisted Living Barriers to Discharge: Continued Medical Work up  Expected Discharge Plan and Services Expected Discharge Plan: Assisted Living In-house Referral: Clinical Social Work     Living arrangements for the past 2 months: Assisted Living Facility Expected Discharge Date: 10/18/21                                     Social Determinants of Health (SDOH) Interventions    Readmission Risk Interventions Readmission Risk Prevention Plan 09/09/2021 09/01/2021  Transportation Screening Complete Complete  PCP or Specialist Appt within 5-7 Days - Complete  Home Care Screening - Complete  Medication Review (RN CM) - Complete  HRI or Home Care Consult Complete -  Social Work Consult for Recovery Care Planning/Counseling Complete -  Palliative Care Screening Not Applicable -  Medication Review Oceanographer) Complete -  Some recent data might be hidden

## 2021-10-19 NOTE — Progress Notes (Signed)
Patient seen and examined at the bedside this morning.  Hemodynamically stable.  No new change in the medical management.  Stable for discharge to skilled nursing facility whenever possible.  Discharge summary and orders are in place.

## 2021-10-19 NOTE — Care Management Important Message (Signed)
Important Message  Patient Details  Name: BRAELYNNE GARINGER MRN: [id number] Date of Birth: 08-09-1928   Medicare Important Message Given:  Yes     Sherilyn Banker 10/19/2021, 12:15 PM

## 2021-10-19 NOTE — Progress Notes (Signed)
Physical Therapy Treatment Patient Details Name: Carrie Best MRN: [id number] DOB: 06-22-1928 Today's Date: 10/19/2021   History of Present Illness [name] is a 88 y.o. female admitted from ALF on 10/15/21 with hypoxia, AMS and hypercarbia requiring BiPAP. Workup for possible PNA, acute CHF, and acute encephalopathy. PMH includes CHF, COIVD-19 (08/2021), a fib, dementia, DM, HLD, GERD, CVA.    PT Comments    Pt received in supine, agreeable to therapy session initially but maintains eyes mostly closed, with limited participation due to consistent lethargy. Pt needing maxA for rolling to L and totalA +2 for transfer to/from EOB. Pt SpO2 briefly desat on 2L [place] once EOB (likely due to cx position, pt given manual assist for improved cervical/upright posture and SpO2 WFL on 2L). Pt BP checked supine/seated and stable WFL in both positions. RN notified of pt lethargy, pt PROM for LE ROM/exercises due to lethargy. Repositioned to sidelying to L for pressure relief and heels floated. SNF remains appropriate due to pt needing totalA for mobility at this time.    Recommendations for follow up therapy are one component of a multi-disciplinary discharge planning process, led by the attending physician.  Recommendations may be updated based on patient status, additional functional criteria and insurance authorization.  Follow Up Recommendations  Skilled nursing-short term rehab (<3 hours/day)     Assistance Recommended at Discharge Frequent or constant Supervision/Assistance  Patient can return home with the following A lot of help with bathing/dressing/bathroom;Two people to help with walking and/or transfers;Help with stairs or ramp for entrance;Assist for transportation   Equipment Recommendations  Other (comment);None recommended by PT (defer to post-acute)    Recommendations for Other Services       Precautions / Restrictions Precautions Precautions: Fall;Other (comment) Precaution  Comments: Bladder/bowel incontinence; watch SpO2 (2-4L O2 baseline per chart); lethargy 2/13 Restrictions Weight Bearing Restrictions: No     Mobility  Bed Mobility Overal bed mobility: Needs Assistance Bed Mobility: Rolling, Supine to Sit, Sit to Supine Rolling: Max assist   Supine to sit: Total assist, +2 for physical assistance, HOB elevated Sit to supine: Total assist, +2 for physical assistance   General bed mobility comments: Pt able to assist briefly with roll to L side with max multimodal cues but nearly totalA for rolling; pt totalA for transition to/from sitting EOB<>supine, no righting reactions and at times not supporting her own head despite max cues and sternal rub pre/while seated. BP stable seated EOB.    Transfers                   General transfer comment: deferred due to significant lethargy throughout session, unsafe to attempt totalA for bed mobility    Ambulation/Gait                   Stairs             Wheelchair Mobility    Modified Rankin (Stroke Patients Only)       Balance Overall balance assessment: Needs assistance Sitting-balance support: No upper extremity supported, Feet supported Sitting balance-Leahy Scale: Zero Sitting balance - Comments: too lethargic to self-support and posterior lean throughout seated time, at times needs max cues for keeping her head up.                                    Cognition Arousal/Alertness: Lethargic Behavior During Therapy:  (too lethargic to  assess) Overall Cognitive Status: History of cognitive impairments - at baseline                                 General Comments: h/o dementia; pt opened eyes to handhold initially, smiles at therapist then closing eyes and remains lethargic throughout session, unable to answer orientation questions or follow simple 1-step commands due to decreased level of consciousness, even once transferred to edge of bed. Pt  did open eyes when BP assessed briefly back in supine but did not maintain open when given tactile/verbal cues. Pt very HOH per RN.        Exercises Other Exercises Other Exercises: supine BLE ankle/knee/hip flex/ext x10 reps, pt needing mostly totalA, some AA with ankle pumps, lethargic. Other Exercises: pt unable to participate in seated LE exercises due to lethargy    General Comments General comments (skin integrity, edema, etc.): SpO2 WFL in supine but desat to 87% briefly while seated on 2L O2 [place], when assisted to maintain improved cervical extension, SpO2 improved, RN notified; BP stable supine/seated/supine and written in vitals flowsheet, pt too lethargic to report any symptoms while seated      Pertinent Vitals/Pain Pain Assessment Pain Assessment: PAINAD Breathing: normal Negative Vocalization: none Facial Expression: smiling or inexpressive Body Language: relaxed Consolability: no need to console PAINAD Score: 0 Pain Intervention(s): Limited activity within patient's tolerance, Monitored during session, Repositioned           PT Goals (current goals can now be found in the care plan section) Acute Rehab PT Goals Patient Stated Goal: not stated (lethargy) PT Goal Formulation: Patient unable to participate in goal setting Time For Goal Achievement: 10/30/21 Progress towards PT goals: Progressing toward goals    Frequency    Min 2X/week      PT Plan Current plan remains appropriate       AM-PAC PT "6 Clicks" Mobility   Outcome Measure  Help needed turning from your back to your side while in a flat bed without using bedrails?: A Lot Help needed moving from lying on your back to sitting on the side of a flat bed without using bedrails?: Total Help needed moving to and from a bed to a chair (including a wheelchair)?: Total Help needed standing up from a chair using your arms (e.g., wheelchair or bedside chair)?: Total Help needed to walk in hospital room?:  Total Help needed climbing 3-5 steps with a railing? : Total 6 Click Score: 7    End of Session Equipment Utilized During Treatment: Oxygen Activity Tolerance: Patient limited by lethargy Patient left: in bed;with call bell/phone within reach;with bed alarm set;Other (comment) (semi-sidelying to L, heels floated) Nurse Communication: Mobility status;Other (comment) (pt lethargy, needs new O2 sensor) PT Visit Diagnosis: Other abnormalities of gait and mobility (R26.89);Muscle weakness (generalized) (M62.81)     Time: [id number] PT Time Calculation (min) (ACUTE ONLY): 22 min  Charges:  $Therapeutic Activity: 8-22 mins                     Hashem Goynes P., PTA Acute Rehabilitation Services Pager: [phone] Office: [place] 10/19/2021, 1:02 PM

## 2021-10-19 NOTE — Plan of Care (Signed)
  Problem: Clinical Measurements: Goal: Ability to maintain clinical measurements within normal limits will improve Outcome: Progressing   Problem: Coping: Goal: Level of anxiety will decrease Outcome: Progressing   Problem: Safety: Goal: Ability to remain free from injury will improve Outcome: Progressing   Problem: Skin Integrity: Goal: Risk for impaired skin integrity will decrease Outcome: Progressing   

## 2021-10-19 NOTE — Plan of Care (Signed)

## 2021-10-19 NOTE — Plan of Care (Signed)

## 2021-10-20 DIAGNOSIS — Z20822 Contact with and (suspected) exposure to covid-19: Secondary | ICD-10-CM | POA: Diagnosis not present

## 2021-10-20 DIAGNOSIS — R278 Other lack of coordination: Secondary | ICD-10-CM | POA: Diagnosis not present

## 2021-10-20 DIAGNOSIS — Z515 Encounter for palliative care: Secondary | ICD-10-CM | POA: Diagnosis not present

## 2021-10-20 DIAGNOSIS — J9690 Respiratory failure, unspecified, unspecified whether with hypoxia or hypercapnia: Secondary | ICD-10-CM | POA: Diagnosis not present

## 2021-10-20 DIAGNOSIS — R1310 Dysphagia, unspecified: Secondary | ICD-10-CM | POA: Diagnosis not present

## 2021-10-20 DIAGNOSIS — J9611 Chronic respiratory failure with hypoxia: Secondary | ICD-10-CM | POA: Diagnosis not present

## 2021-10-20 DIAGNOSIS — R0603 Acute respiratory distress: Secondary | ICD-10-CM | POA: Diagnosis present

## 2021-10-20 DIAGNOSIS — I480 Paroxysmal atrial fibrillation: Secondary | ICD-10-CM | POA: Diagnosis not present

## 2021-10-20 DIAGNOSIS — R5381 Other malaise: Secondary | ICD-10-CM | POA: Diagnosis not present

## 2021-10-20 DIAGNOSIS — Z7901 Long term (current) use of anticoagulants: Secondary | ICD-10-CM | POA: Diagnosis not present

## 2021-10-20 DIAGNOSIS — R262 Difficulty in walking, not elsewhere classified: Secondary | ICD-10-CM | POA: Diagnosis not present

## 2021-10-20 DIAGNOSIS — R7309 Other abnormal glucose: Secondary | ICD-10-CM | POA: Diagnosis not present

## 2021-10-20 DIAGNOSIS — R404 Transient alteration of awareness: Secondary | ICD-10-CM | POA: Diagnosis not present

## 2021-10-20 DIAGNOSIS — M6281 Muscle weakness (generalized): Secondary | ICD-10-CM | POA: Diagnosis not present

## 2021-10-20 DIAGNOSIS — Z743 Need for continuous supervision: Secondary | ICD-10-CM | POA: Diagnosis not present

## 2021-10-20 DIAGNOSIS — R6889 Other general symptoms and signs: Secondary | ICD-10-CM | POA: Diagnosis not present

## 2021-10-20 DIAGNOSIS — Z8673 Personal history of transient ischemic attack (TIA), and cerebral infarction without residual deficits: Secondary | ICD-10-CM | POA: Diagnosis not present

## 2021-10-20 DIAGNOSIS — J9601 Acute respiratory failure with hypoxia: Secondary | ICD-10-CM | POA: Diagnosis not present

## 2021-10-20 DIAGNOSIS — D72829 Elevated white blood cell count, unspecified: Secondary | ICD-10-CM | POA: Diagnosis not present

## 2021-10-20 DIAGNOSIS — R2681 Unsteadiness on feet: Secondary | ICD-10-CM | POA: Diagnosis not present

## 2021-10-20 DIAGNOSIS — R2689 Other abnormalities of gait and mobility: Secondary | ICD-10-CM | POA: Diagnosis not present

## 2021-10-20 DIAGNOSIS — Z8616 Personal history of COVID-19: Secondary | ICD-10-CM | POA: Diagnosis not present

## 2021-10-20 DIAGNOSIS — N183 Chronic kidney disease, stage 3 unspecified: Secondary | ICD-10-CM | POA: Diagnosis not present

## 2021-10-20 DIAGNOSIS — I5032 Chronic diastolic (congestive) heart failure: Secondary | ICD-10-CM | POA: Diagnosis not present

## 2021-10-20 DIAGNOSIS — I10 Essential (primary) hypertension: Secondary | ICD-10-CM | POA: Diagnosis not present

## 2021-10-20 DIAGNOSIS — R000 Tachycardia, unspecified: Secondary | ICD-10-CM | POA: Diagnosis not present

## 2021-10-20 DIAGNOSIS — G9340 Encephalopathy, unspecified: Secondary | ICD-10-CM | POA: Diagnosis not present

## 2021-10-20 DIAGNOSIS — E119 Type 2 diabetes mellitus without complications: Secondary | ICD-10-CM | POA: Diagnosis not present

## 2021-10-20 DIAGNOSIS — E559 Vitamin D deficiency, unspecified: Secondary | ICD-10-CM | POA: Diagnosis not present

## 2021-10-20 DIAGNOSIS — Z9981 Dependence on supplemental oxygen: Secondary | ICD-10-CM | POA: Diagnosis not present

## 2021-10-20 DIAGNOSIS — Z7401 Bed confinement status: Secondary | ICD-10-CM | POA: Diagnosis not present

## 2021-10-20 DIAGNOSIS — G459 Transient cerebral ischemic attack, unspecified: Secondary | ICD-10-CM | POA: Diagnosis not present

## 2021-10-20 DIAGNOSIS — I5033 Acute on chronic diastolic (congestive) heart failure: Secondary | ICD-10-CM | POA: Diagnosis not present

## 2021-10-20 DIAGNOSIS — R1312 Dysphagia, oropharyngeal phase: Secondary | ICD-10-CM | POA: Diagnosis not present

## 2021-10-20 DIAGNOSIS — D518 Other vitamin B12 deficiency anemias: Secondary | ICD-10-CM | POA: Diagnosis not present

## 2021-10-20 DIAGNOSIS — I4891 Unspecified atrial fibrillation: Secondary | ICD-10-CM | POA: Diagnosis not present

## 2021-10-20 DIAGNOSIS — I509 Heart failure, unspecified: Secondary | ICD-10-CM | POA: Diagnosis not present

## 2021-10-20 DIAGNOSIS — R092 Respiratory arrest: Secondary | ICD-10-CM | POA: Diagnosis not present

## 2021-10-20 DIAGNOSIS — I499 Cardiac arrhythmia, unspecified: Secondary | ICD-10-CM | POA: Diagnosis not present

## 2021-10-20 DIAGNOSIS — J9600 Acute respiratory failure, unspecified whether with hypoxia or hypercapnia: Secondary | ICD-10-CM | POA: Diagnosis not present

## 2021-10-20 DIAGNOSIS — R9431 Abnormal electrocardiogram [ECG] [EKG]: Secondary | ICD-10-CM | POA: Diagnosis not present

## 2021-10-20 DIAGNOSIS — E872 Acidosis, unspecified: Secondary | ICD-10-CM | POA: Diagnosis not present

## 2021-10-20 DIAGNOSIS — D539 Nutritional anemia, unspecified: Secondary | ICD-10-CM | POA: Diagnosis not present

## 2021-10-20 DIAGNOSIS — G9341 Metabolic encephalopathy: Secondary | ICD-10-CM | POA: Diagnosis not present

## 2021-10-20 DIAGNOSIS — A419 Sepsis, unspecified organism: Secondary | ICD-10-CM | POA: Diagnosis not present

## 2021-10-20 DIAGNOSIS — E038 Other specified hypothyroidism: Secondary | ICD-10-CM | POA: Diagnosis not present

## 2021-10-20 DIAGNOSIS — H9193 Unspecified hearing loss, bilateral: Secondary | ICD-10-CM | POA: Diagnosis not present

## 2021-10-20 LAB — CULTURE, BLOOD (ROUTINE X 2)
Culture: NO GROWTH
Culture: NO GROWTH
Special Requests: ADEQUATE

## 2021-10-20 NOTE — TOC Progression Note (Signed)
Transition of Care [facility]) - Progression Note    Patient Details  Name: RICHETTA BRIDGETT MRN: [id number] Date of Birth: 04-11-1928  Transition of Care [facility]) CM/SW Contact  Joanne Chars, LCSW Phone Number: 10/20/2021, 8:52 AM  Clinical Narrative:   Auth [place] in [place][id number], 3 days 2/14-2/16    Expected Discharge Plan: Assisted Living Barriers to Discharge: Continued Medical Work up  Expected Discharge Plan and Services Expected Discharge Plan: Assisted Living In-house Referral: Clinical Social Work     Living arrangements for the past 2 months: [place] Expected Discharge Date: 10/18/21                                     Social Determinants of Health (SDOH) Interventions    Readmission Risk Interventions Readmission Risk Prevention Plan 09/09/2021 09/01/2021  Transportation Screening Complete Complete  PCP or Specialist Appt within 5-7 Days - Complete  Home Care Screening - Complete  Medication Review (RN CM) - Complete  HRI or Home Care Consult Complete -  Social Work Consult for [place] Planning/Counseling Complete -  Palliative Care Screening Not Applicable -  Medication Review Press photographer) Complete -  Some recent data might be hidden

## 2021-10-20 NOTE — Progress Notes (Signed)
Patient seen and examined at the bedside this morning.  Hemodynamically stable .  No change in the medical management.  Discharge orders and summary already in place.  Stable for discharge to SNF today

## 2021-10-20 NOTE — Plan of Care (Addendum)
Report called, spoke with shaniqua, RN. Patient stable ready for discharge.  Problem: Education: Goal: Knowledge of General Education information will improve Description: Including pain rating scale, medication(s)/side effects and non-pharmacologic comfort measures Outcome: Progressing   Problem: Activity: Goal: Risk for activity intolerance will decrease Outcome: Progressing   Problem: Nutrition: Goal: Adequate nutrition will be maintained Outcome: Progressing   Problem: Pain Managment: Goal: General experience of comfort will improve Outcome: Progressing   Problem: Safety: Goal: Ability to remain free from injury will improve Outcome: Progressing   Problem: Skin Integrity: Goal: Risk for impaired skin integrity will decrease Outcome: Progressing

## 2021-10-20 NOTE — TOC Transition Note (Signed)
Transition of Care [facility]) - CM/SW Discharge Note   Patient Details  Name: SHARELL HILMER MRN: [id number] Date of Birth: 10/19/1927  Transition of Care [facility]) CM/SW Contact:  Lorri Frederick, LCSW Phone Number: 10/20/2021, 10:21 AM   Clinical Narrative:   Pt discharging to [facility], room 1204P.  RN call [phone] for report.     Final next level of care: Skilled Nursing Facility Barriers to Discharge: Barriers Resolved   Patient Goals and CMS Choice Patient states their goals for this hospitalization and ongoing recovery are:: DIL states " live rest of days pain free and at peace".      Discharge Placement              Patient chooses bed at:  [facility]) Patient to be transferred to facility by: [facility] Name of family member notified: daughter in law, Debbie Patient and family notified of of transfer: 10/20/21  Discharge Plan and Services In-house Referral: Clinical Social Work                                   Social Determinants of Health (SDOH) Interventions     Readmission Risk Interventions Readmission Risk Prevention Plan 09/09/2021 09/01/2021  Transportation Screening Complete Complete  PCP or Specialist Appt within 5-7 Days - Complete  Home Care Screening - Complete  Medication Review (RN CM) - Complete  HRI or Home Care Consult Complete -  Social Work Consult for Recovery Care Planning/Counseling Complete -  Palliative Care Screening Not Applicable -  Medication Review Oceanographer) Complete -  Some recent data might be hidden

## 2021-10-21 ENCOUNTER — Other Ambulatory Visit: Payer: Self-pay | Admitting: *Deleted

## 2021-10-21 DIAGNOSIS — E038 Other specified hypothyroidism: Secondary | ICD-10-CM | POA: Diagnosis not present

## 2021-10-21 DIAGNOSIS — I509 Heart failure, unspecified: Secondary | ICD-10-CM | POA: Diagnosis not present

## 2021-10-21 DIAGNOSIS — D518 Other vitamin B12 deficiency anemias: Secondary | ICD-10-CM | POA: Diagnosis not present

## 2021-10-21 DIAGNOSIS — I10 Essential (primary) hypertension: Secondary | ICD-10-CM | POA: Diagnosis not present

## 2021-10-21 DIAGNOSIS — M6281 Muscle weakness (generalized): Secondary | ICD-10-CM | POA: Diagnosis not present

## 2021-10-21 DIAGNOSIS — I5032 Chronic diastolic (congestive) heart failure: Secondary | ICD-10-CM | POA: Diagnosis not present

## 2021-10-21 DIAGNOSIS — G9341 Metabolic encephalopathy: Secondary | ICD-10-CM | POA: Diagnosis not present

## 2021-10-21 DIAGNOSIS — E559 Vitamin D deficiency, unspecified: Secondary | ICD-10-CM | POA: Diagnosis not present

## 2021-10-21 DIAGNOSIS — E119 Type 2 diabetes mellitus without complications: Secondary | ICD-10-CM | POA: Diagnosis not present

## 2021-10-21 DIAGNOSIS — N183 Chronic kidney disease, stage 3 unspecified: Secondary | ICD-10-CM | POA: Diagnosis not present

## 2021-10-21 DIAGNOSIS — I4891 Unspecified atrial fibrillation: Secondary | ICD-10-CM | POA: Diagnosis not present

## 2021-10-21 NOTE — Patient Outreach (Signed)
Per [facility] eligible member resides in  [facility].  Screened for potential [facility] Care Management services as a benefit of member's insurance plan.  Member's PCP at [facility] [place] has [facility] care management services available if needed post SNF.    Mrs. Notaro admitted to [facility] on 10/20/21.  Update received from [facility] SW indicating member is from [facility] and the plan is to return there.   No identifiable care management needs at this time.    Raiford Noble, MSN, RN,BSN [facility] Care Coordinator [phone] [facility]) [phone]  (Toll free office)

## 2021-10-22 DIAGNOSIS — R262 Difficulty in walking, not elsewhere classified: Secondary | ICD-10-CM | POA: Diagnosis not present

## 2021-10-22 DIAGNOSIS — N183 Chronic kidney disease, stage 3 unspecified: Secondary | ICD-10-CM | POA: Diagnosis not present

## 2021-10-22 DIAGNOSIS — I5032 Chronic diastolic (congestive) heart failure: Secondary | ICD-10-CM | POA: Diagnosis not present

## 2021-10-22 DIAGNOSIS — Z8616 Personal history of COVID-19: Secondary | ICD-10-CM | POA: Diagnosis not present

## 2021-10-22 DIAGNOSIS — D539 Nutritional anemia, unspecified: Secondary | ICD-10-CM | POA: Diagnosis not present

## 2021-10-22 DIAGNOSIS — I4891 Unspecified atrial fibrillation: Secondary | ICD-10-CM | POA: Diagnosis not present

## 2021-10-22 DIAGNOSIS — I5033 Acute on chronic diastolic (congestive) heart failure: Secondary | ICD-10-CM | POA: Diagnosis not present

## 2021-10-22 DIAGNOSIS — R5381 Other malaise: Secondary | ICD-10-CM | POA: Diagnosis not present

## 2021-10-26 DIAGNOSIS — E119 Type 2 diabetes mellitus without complications: Secondary | ICD-10-CM | POA: Diagnosis not present

## 2021-10-26 DIAGNOSIS — I509 Heart failure, unspecified: Secondary | ICD-10-CM | POA: Diagnosis not present

## 2021-10-26 DIAGNOSIS — I4891 Unspecified atrial fibrillation: Secondary | ICD-10-CM | POA: Diagnosis not present

## 2021-10-26 DIAGNOSIS — M6281 Muscle weakness (generalized): Secondary | ICD-10-CM | POA: Diagnosis not present

## 2021-10-26 DIAGNOSIS — G9341 Metabolic encephalopathy: Secondary | ICD-10-CM | POA: Diagnosis not present

## 2021-10-28 DIAGNOSIS — Z8673 Personal history of transient ischemic attack (TIA), and cerebral infarction without residual deficits: Secondary | ICD-10-CM | POA: Diagnosis not present

## 2021-10-28 DIAGNOSIS — I4891 Unspecified atrial fibrillation: Secondary | ICD-10-CM | POA: Diagnosis not present

## 2021-10-28 DIAGNOSIS — J9600 Acute respiratory failure, unspecified whether with hypoxia or hypercapnia: Secondary | ICD-10-CM | POA: Diagnosis not present

## 2021-10-28 DIAGNOSIS — E119 Type 2 diabetes mellitus without complications: Secondary | ICD-10-CM | POA: Diagnosis not present

## 2021-10-28 DIAGNOSIS — H9193 Unspecified hearing loss, bilateral: Secondary | ICD-10-CM | POA: Diagnosis not present

## 2021-10-28 DIAGNOSIS — N183 Chronic kidney disease, stage 3 unspecified: Secondary | ICD-10-CM | POA: Diagnosis not present

## 2021-10-28 DIAGNOSIS — M6281 Muscle weakness (generalized): Secondary | ICD-10-CM | POA: Diagnosis not present

## 2021-10-28 DIAGNOSIS — I509 Heart failure, unspecified: Secondary | ICD-10-CM | POA: Diagnosis not present

## 2021-10-28 DIAGNOSIS — G9341 Metabolic encephalopathy: Secondary | ICD-10-CM | POA: Diagnosis not present

## 2021-10-28 DIAGNOSIS — I5033 Acute on chronic diastolic (congestive) heart failure: Secondary | ICD-10-CM | POA: Diagnosis not present

## 2021-10-28 DIAGNOSIS — J9611 Chronic respiratory failure with hypoxia: Secondary | ICD-10-CM | POA: Diagnosis not present

## 2021-10-29 DIAGNOSIS — Z9981 Dependence on supplemental oxygen: Secondary | ICD-10-CM | POA: Diagnosis not present

## 2021-10-29 DIAGNOSIS — I480 Paroxysmal atrial fibrillation: Secondary | ICD-10-CM | POA: Diagnosis not present

## 2021-10-29 DIAGNOSIS — R1310 Dysphagia, unspecified: Secondary | ICD-10-CM | POA: Diagnosis not present

## 2021-10-29 DIAGNOSIS — I5032 Chronic diastolic (congestive) heart failure: Secondary | ICD-10-CM | POA: Diagnosis not present

## 2021-10-29 DIAGNOSIS — Z515 Encounter for palliative care: Secondary | ICD-10-CM | POA: Diagnosis not present

## 2021-10-29 DIAGNOSIS — Z8616 Personal history of COVID-19: Secondary | ICD-10-CM | POA: Diagnosis not present

## 2021-10-30 DIAGNOSIS — J9611 Chronic respiratory failure with hypoxia: Secondary | ICD-10-CM | POA: Diagnosis not present

## 2021-10-30 DIAGNOSIS — I5033 Acute on chronic diastolic (congestive) heart failure: Secondary | ICD-10-CM | POA: Diagnosis not present

## 2021-10-31 ENCOUNTER — Encounter (HOSPITAL_COMMUNITY): Payer: Self-pay | Admitting: Emergency Medicine

## 2021-10-31 ENCOUNTER — Emergency Department (HOSPITAL_COMMUNITY): Payer: Medicare Other

## 2021-10-31 ENCOUNTER — Emergency Department (HOSPITAL_COMMUNITY)
Admission: EM | Admit: 2021-10-31 | Discharge: 2021-11-04 | Disposition: E | Payer: Medicare Other | Attending: Emergency Medicine | Admitting: Emergency Medicine

## 2021-10-31 ENCOUNTER — Other Ambulatory Visit: Payer: Self-pay

## 2021-10-31 DIAGNOSIS — Z7901 Long term (current) use of anticoagulants: Secondary | ICD-10-CM | POA: Insufficient documentation

## 2021-10-31 DIAGNOSIS — R092 Respiratory arrest: Secondary | ICD-10-CM | POA: Diagnosis not present

## 2021-10-31 DIAGNOSIS — E872 Acidosis, unspecified: Secondary | ICD-10-CM | POA: Insufficient documentation

## 2021-10-31 DIAGNOSIS — Z20822 Contact with and (suspected) exposure to covid-19: Secondary | ICD-10-CM | POA: Diagnosis not present

## 2021-10-31 DIAGNOSIS — R9431 Abnormal electrocardiogram [ECG] [EKG]: Secondary | ICD-10-CM | POA: Diagnosis not present

## 2021-10-31 DIAGNOSIS — R000 Tachycardia, unspecified: Secondary | ICD-10-CM | POA: Insufficient documentation

## 2021-10-31 DIAGNOSIS — D72829 Elevated white blood cell count, unspecified: Secondary | ICD-10-CM | POA: Insufficient documentation

## 2021-10-31 DIAGNOSIS — A419 Sepsis, unspecified organism: Secondary | ICD-10-CM | POA: Diagnosis not present

## 2021-10-31 DIAGNOSIS — R0603 Acute respiratory distress: Secondary | ICD-10-CM | POA: Diagnosis present

## 2021-10-31 LAB — COMPREHENSIVE METABOLIC PANEL
ALT: 15 U/L (ref 0–44)
AST: 24 U/L (ref 15–41)
Albumin: 3.2 g/dL — ABNORMAL LOW (ref 3.5–5.0)
Alkaline Phosphatase: 103 U/L (ref 38–126)
Anion gap: 7 (ref 5–15)
BUN: 26 mg/dL — ABNORMAL HIGH (ref 8–23)
CO2: 37 mmol/L — ABNORMAL HIGH (ref 22–32)
Calcium: 10 mg/dL (ref 8.9–10.3)
Chloride: 93 mmol/L — ABNORMAL LOW (ref 98–111)
Creatinine, Ser: 0.99 mg/dL (ref 0.44–1.00)
GFR, Estimated: 53 mL/min — ABNORMAL LOW (ref >60)
Glucose, Bld: 193 mg/dL — ABNORMAL HIGH (ref 70–99)
Potassium: 4.9 mmol/L (ref 3.5–5.1)
Sodium: 137 mmol/L (ref 135–145)
Total Bilirubin: 1.4 mg/dL — ABNORMAL HIGH (ref 0.3–1.2)
Total Protein: 6.4 g/dL — ABNORMAL LOW (ref 6.5–8.1)

## 2021-10-31 LAB — CBC WITH DIFFERENTIAL/PLATELET
Abs Immature Granulocytes: 0.13 10*3/uL — ABNORMAL HIGH (ref 0.00–0.07)
Basophils Absolute: 0 10*3/uL (ref 0.0–0.1)
Basophils Relative: 0 %
Eosinophils Absolute: 0 10*3/uL (ref 0.0–0.5)
Eosinophils Relative: 0 %
HCT: 37.8 % (ref 36.0–46.0)
Hemoglobin: 11.3 g/dL — ABNORMAL LOW (ref 12.0–15.0)
Immature Granulocytes: 1 %
Lymphocytes Relative: 4 %
Lymphs Abs: 0.5 10*3/uL — ABNORMAL LOW (ref 0.7–4.0)
MCH: 30.9 pg (ref 26.0–34.0)
MCHC: 29.9 g/dL — ABNORMAL LOW (ref 30.0–36.0)
MCV: 103.3 fL — ABNORMAL HIGH (ref 80.0–100.0)
Monocytes Absolute: 0.4 10*3/uL (ref 0.1–1.0)
Monocytes Relative: 3 %
Neutro Abs: 13.3 10*3/uL — ABNORMAL HIGH (ref 1.7–7.7)
Neutrophils Relative %: 92 %
Platelets: 139 10*3/uL — ABNORMAL LOW (ref 150–400)
RBC: 3.66 MIL/uL — ABNORMAL LOW (ref 3.87–5.11)
RDW: 13.8 % (ref 11.5–15.5)
WBC: 14.4 10*3/uL — ABNORMAL HIGH (ref 4.0–10.5)
nRBC: 0 % (ref 0.0–0.2)

## 2021-10-31 LAB — APTT: aPTT: 37 seconds — ABNORMAL HIGH (ref 24–36)

## 2021-10-31 LAB — RESP PANEL BY RT-PCR (FLU A&B, COVID) ARPGX2
Influenza A by PCR: NEGATIVE
Influenza B by PCR: NEGATIVE
SARS Coronavirus 2 by RT PCR: NEGATIVE

## 2021-10-31 LAB — LACTIC ACID, PLASMA: Lactic Acid, Venous: 2.6 mmol/L (ref 0.5–1.9)

## 2021-10-31 LAB — PROTIME-INR
INR: 1.6 — ABNORMAL HIGH (ref 0.8–1.2)
Prothrombin Time: 19.3 seconds — ABNORMAL HIGH (ref 11.4–15.2)

## 2021-10-31 MED ORDER — ALBUTEROL SULFATE (2.5 MG/3ML) 0.083% IN NEBU
2.5000 mg | INHALATION_SOLUTION | Freq: Once | RESPIRATORY_TRACT | Status: DC
Start: 2021-10-31 — End: 2021-10-31
  Filled 2021-10-31: qty 3

## 2021-10-31 MED ORDER — GLYCOPYRROLATE 1 MG PO TABS
1.0000 mg | ORAL_TABLET | ORAL | Status: DC | PRN
  Filled 2021-10-31: qty 1

## 2021-10-31 MED ORDER — MORPHINE SULFATE (PF) 2 MG/ML IV SOLN
1.0000 mg | INTRAVENOUS | Status: DC | PRN
  Administered 2021-10-31: 1 mg via INTRAVENOUS
  Filled 2021-10-31 (×2): qty 1

## 2021-10-31 MED ORDER — ALBUTEROL (5 MG/ML) CONTINUOUS INHALATION SOLN: 20.0000 mg/h | INHALATION_SOLUTION | Freq: Once | RESPIRATORY_TRACT | Status: DC

## 2021-10-31 MED ORDER — LACTATED RINGERS IV BOLUS
1000.0000 mL | Freq: Once | INTRAVENOUS | Status: AC
Start: 2021-10-31 — End: 2021-10-31
  Administered 2021-10-31: 1000 mL via INTRAVENOUS

## 2021-10-31 MED ORDER — GLYCOPYRROLATE 0.2 MG/ML IJ SOLN
0.2000 mg | INTRAMUSCULAR | Status: DC | PRN
  Administered 2021-10-31: 0.2 mg via INTRAVENOUS
  Filled 2021-10-31: qty 1

## 2021-10-31 MED ORDER — ALBUTEROL SULFATE (2.5 MG/3ML) 0.083% IN NEBU
INHALATION_SOLUTION | RESPIRATORY_TRACT | Status: AC
  Filled 2021-10-31: qty 9

## 2021-10-31 MED ORDER — ONDANSETRON 4 MG PO TBDP
4.0000 mg | ORAL_TABLET | Freq: Four times a day (QID) | ORAL | Status: DC | PRN
  Filled 2021-10-31: qty 1

## 2021-10-31 MED ORDER — MORPHINE SULFATE (PF) 2 MG/ML IV SOLN
2.0000 mg | Freq: Once | INTRAVENOUS | Status: AC
  Administered 2021-10-31: 2 mg via INTRAVENOUS

## 2021-10-31 MED ORDER — METHYLPREDNISOLONE SODIUM SUCC 125 MG IJ SOLR
125.0000 mg | INTRAMUSCULAR | Status: AC
  Administered 2021-10-31: 125 mg via INTRAVENOUS
  Filled 2021-10-31: qty 2

## 2021-10-31 MED ORDER — LEVOFLOXACIN IN D5W 750 MG/150ML IV SOLN
750.0000 mg | Freq: Once | INTRAVENOUS | Status: DC
Start: 2021-10-31 — End: 2021-10-31
  Filled 2021-10-31: qty 150

## 2021-10-31 MED ORDER — ALBUTEROL SULFATE (2.5 MG/3ML) 0.083% IN NEBU
10.0000 mg | INHALATION_SOLUTION | Freq: Once | RESPIRATORY_TRACT | Status: AC
  Administered 2021-10-31: 10 mg via RESPIRATORY_TRACT

## 2021-10-31 MED ORDER — LACTATED RINGERS IV SOLN: INTRAVENOUS | Status: DC

## 2021-10-31 MED ORDER — SODIUM CHLORIDE 0.9 % IV SOLN
2.0000 g | INTRAVENOUS | Status: DC
  Administered 2021-10-31: 2 g via INTRAVENOUS
  Filled 2021-10-31: qty 20

## 2021-10-31 MED ORDER — GLYCOPYRROLATE 0.2 MG/ML IJ SOLN: 0.2000 mg | INTRAMUSCULAR | Status: DC | PRN

## 2021-10-31 MED ORDER — SODIUM CHLORIDE 0.9 % IV SOLN
500.0000 mg | INTRAVENOUS | Status: DC
  Administered 2021-10-31: 500 mg via INTRAVENOUS
  Filled 2021-10-31: qty 5

## 2021-10-31 MED ORDER — IPRATROPIUM BROMIDE 0.02 % IN SOLN
0.5000 mg | Freq: Once | RESPIRATORY_TRACT | Status: AC
  Administered 2021-10-31: 0.5 mg via RESPIRATORY_TRACT
  Filled 2021-10-31: qty 2.5

## 2021-10-31 MED ORDER — ONDANSETRON HCL 4 MG/2ML IJ SOLN
4.0000 mg | Freq: Four times a day (QID) | INTRAMUSCULAR | Status: DC | PRN
  Administered 2021-10-31: 4 mg via INTRAVENOUS
  Filled 2021-10-31: qty 2

## 2021-10-31 MED ORDER — LORAZEPAM 2 MG/ML IJ SOLN
1.0000 mg | INTRAMUSCULAR | Status: DC | PRN
Start: 2021-10-31 — End: 2021-11-01
  Administered 2021-10-31: 1 mg via INTRAVENOUS
  Filled 2021-10-31: qty 1

## 2021-11-04 NOTE — ED Notes (Signed)
Lactic acid 2.6, [place] MD notified.

## 2021-11-04 NOTE — Progress Notes (Signed)
[place] following for sepsis protocol. 

## 2021-11-04 NOTE — ED Notes (Signed)
Family at bedside, chaplain also at bedside speaking with family.

## 2021-11-04 NOTE — ED Notes (Signed)
Bed placement called. Patients family requested [employer] [address].

## 2021-11-04 NOTE — Progress Notes (Signed)
Chaplain was called to bedside for attending to patient who was visited by her son a nd daughter and daughter in law.  Provided spiritual care and comfort to Carlette and her two adult children.  Provided support while RN and MD staff attended to patient atnd family.  MD explained comfort care measures.  Anointed Adelayde with oil and prayed with family surrounding bedside.   Spoke with staff following visit. No spiritual distress exhibited at this time.  Ended visit with a departing blessing.      November 03, 2021 1600  Clinical Encounter Type  Visited With Patient and family together  Visit Type Initial;Spiritual support;Psychological support  Referral From Nurse;Physician  Consult/Referral To Chaplain  Spiritual Encounters  Spiritual Needs Sacred text;Prayer;Ritual;Emotional;Grief support  Stress Factors  Patient Stress Factors Loss of control  Family Stress Factors Exhausted;Health changes;Loss;Loss of control;Major life changes

## 2021-11-04 NOTE — ED Notes (Signed)
MD called to bedside

## 2021-11-04 NOTE — ED Provider Notes (Signed)
[facility]-EMERGENCY DEPT Provider Note   CSN: [id number] Arrival date & time: November 01, 2021  1432     History  Chief Complaint  Patient presents with   Respiratory Arrest    Carrie Best is a 88 y.o. female.  HPI Patient presents in respiratory distress from nursing facility.  History is obtained by EMS individuals, chart review, and paper documentation from the facility.  Eventually patient is joined by her daughter, son, daughter-in-law who add details.  Patient with multiple medical issues including recent hospitalization for sepsis, pneumonia and is currently receiving antibiotics.  She also wears oxygen 24/7, 5 L.  Per EMS report the patient was found to be listless, apneic by staff at her facility.  Patient received nonrebreather mask resuscitation in route, had some improvement in her oxygen status which was reportedly in the 60% range on discovery, improving into the 80s.  Patient cannot provide any details of her history, level 5 caveat secondary to acuity of condition.    Home Medications Prior to Admission medications   Medication Sig Start Date End Date Taking? Authorizing Provider  apixaban (ELIQUIS) 5 MG TABS tablet Take 1 tablet (5 mg total) by mouth 2 (two) times daily. 07/27/20   Shon Hale, MD  ascorbic acid (VITAMIN C) 500 MG tablet Take 1 tablet (500 mg total) by mouth daily. 09/02/21   Vassie Loll, MD  atorvastatin (LIPITOR) 40 MG tablet Take 1 tablet (40 mg total) by mouth daily. 06/26/19   Darlin Drop, DO  busPIRone (BUSPAR) 5 MG tablet Take 2.5 mg by mouth 3 (three) times daily.    [provider]  diltiazem (CARDIZEM CD) 120 MG 24 hr capsule Take 1 capsule (120 mg total) by mouth daily. 10/18/21   Burnadette Pop, MD  DULoxetine (CYMBALTA) 20 MG capsule Take 1 capsule (20 mg total) by mouth daily. 10/18/21   Burnadette Pop, MD  guaiFENesin-dextromethorphan (ROBITUSSIN DM) 100-10 MG/5ML syrup Take 10 mLs by mouth every 4  (four) hours as needed for cough. Patient not taking: Reported on 10/15/2021 09/01/21   Vassie Loll, MD  Ipratropium-Albuterol (COMBIVENT) 20-100 MCG/ACT AERS respimat Inhale 1 puff into the lungs 2 (two) times daily. 09/01/21   Vassie Loll, MD  isosorbide mononitrate (IMDUR) 30 MG 24 hr tablet Take 0.5 tablets (15 mg total) by mouth daily. 10/18/21   Burnadette Pop, MD  metoprolol tartrate (LOPRESSOR) 25 MG tablet Take 0.5 tablets (12.5 mg total) by mouth 2 (two) times daily. 10/18/21   Burnadette Pop, MD  polyethylene glycol (MIRALAX / GLYCOLAX) 17 g packet Take 17 g by mouth daily. 06/26/19   Darlin Drop, DO  Potassium Chloride ER 20 MEQ TBCR Take 1 tablet by mouth daily. Patient taking differently: Take 20 mEq by mouth daily. 07/27/20   Shon Hale, MD  risperiDONE (RISPERDAL) 0.25 MG tablet Take 0.25-0.5 mg by mouth See admin instructions. Take 0.25mg  oral at 0800 and take 0.50mg  oral at 2000 10/12/21   [provider]  torsemide (DEMADEX) 20 MG tablet Take 1 tablet (20 mg total) by mouth every other day. 09/14/21   Vassie Loll, MD  zinc sulfate 220 (50 Zn) MG capsule Take 1 capsule (220 mg total) by mouth daily. 09/02/21   Vassie Loll, MD      Allergies    Codeine, Elemental sulfur, and Penicillins    Review of Systems   Review of Systems  Unable to perform ROS: Acuity of condition   Physical Exam Updated Vital Signs BP Marland Kitchen)  98/48 Comment: EDP at bedside   Pulse 89    Temp (!) 95.5 F (35.3 C) (Rectal)    Resp 17    Ht 5\' 8"  (1.727 m)    Wt 66 kg    SpO2 (!) 63%    BMI 22.12 kg/m  Physical Exam Vitals and nursing note reviewed.  Constitutional:      General: She is in acute distress.     Appearance: She is well-developed. She is ill-appearing.  HENT:     Head: Normocephalic and atraumatic.  Eyes:     Conjunctiva/sclera: Conjunctivae normal.  Cardiovascular:     Rate and Rhythm: Tachycardia present. Rhythm irregular.  Pulmonary:     Effort: Pulmonary  effort is normal. No respiratory distress.     Breath sounds: Normal breath sounds. No stridor.     Comments: Minimal breath sounds bilaterally, poor inspiratory effort Abdominal:     General: There is no distension.  Musculoskeletal:        General: No deformity.  Skin:    General: Skin is warm and dry.  Neurological:     Cranial Nerves: No cranial nerve deficit.     Comments: Listless, moves extremities minimally  Psychiatric:     Comments: Impaired    ED Results / Procedures / Treatments   Labs (all labs ordered are listed, but only abnormal results are displayed) Labs Reviewed  LACTIC ACID, PLASMA - Abnormal; Notable for the following components:      Result Value   Lactic Acid, Venous 2.6 (*)    All other components within normal limits  COMPREHENSIVE METABOLIC PANEL - Abnormal; Notable for the following components:   Chloride 93 (*)    CO2 37 (*)    Glucose, Bld 193 (*)    BUN 26 (*)    Total Protein 6.4 (*)    Albumin 3.2 (*)    Total Bilirubin 1.4 (*)    GFR, Estimated 53 (*)    All other components within normal limits  CBC WITH DIFFERENTIAL/PLATELET - Abnormal; Notable for the following components:   WBC 14.4 (*)    RBC 3.66 (*)    Hemoglobin 11.3 (*)    MCV 103.3 (*)    MCHC 29.9 (*)    Platelets 139 (*)    Neutro Abs 13.3 (*)    Lymphs Abs 0.5 (*)    Abs Immature Granulocytes 0.13 (*)    All other components within normal limits  PROTIME-INR - Abnormal; Notable for the following components:   Prothrombin Time 19.3 (*)    INR 1.6 (*)    All other components within normal limits  APTT - Abnormal; Notable for the following components:   aPTT 37 (*)    All other components within normal limits  RESP PANEL BY RT-PCR (FLU A&B, COVID) ARPGX2  CULTURE, BLOOD (ROUTINE X 2)  CULTURE, BLOOD (ROUTINE X 2)  URINE CULTURE  LACTIC ACID, PLASMA  URINALYSIS, ROUTINE W REFLEX MICROSCOPIC    EKG EKG Interpretation  Date/Time:  Saturday 2021-11-05 14:36:34  EST Ventricular Rate:  93 PR Interval:    QRS Duration: 138 QT Interval:  415 QTC Calculation: 517 R Axis:   7 Text Interpretation: Atrial fibrillation LVH with secondary repolarization abnormality Anterior Q waves, possibly due to LVH Abnormal ECG Confirmed by [birth year]1980 [phone]) on 11/05/2021 2:50:39 PM  Radiology DG Chest Port 1 View  Result Date: 11/05/21 CLINICAL DATA:  Sepsis EXAM: PORTABLE CHEST 1 VIEW COMPARISON:  10/15/2021 FINDINGS: Low  lung volumes. Mild airspace disease in the lung bases. Upper lungs clear. No pneumothorax. Fixation of the RIGHT humerus. IMPRESSION: 1. Low lung volumes. 2. Mild bilateral airspace disease suggests pulmonary edema. 3. No pneumothorax. Electronically Signed   By: Genevive BiStewart  Edmunds M.D.   On: 11-10-2021 15:02    Procedures Procedures    Medications Ordered in ED Medications  lactated ringers infusion ( Intravenous New Bag/Given 2021/10/01 1501)  albuterol (PROVENTIL) (2.5 MG/3ML) 0.083% nebulizer solution (  Not Given 2021/10/01 1455)  cefTRIAXone (ROCEPHIN) 2 g in sodium chloride 0.9 % 100 mL IVPB (0 g Intravenous Stopped 2021/10/01 1530)  azithromycin (ZITHROMAX) 500 mg in sodium chloride 0.9 % 250 mL IVPB (500 mg Intravenous New Bag/Given 2021/10/01 1528)  glycopyrrolate (ROBINUL) tablet 1 mg ( Oral See Alternative 2021/10/01 1629)    Or  glycopyrrolate (ROBINUL) injection 0.2 mg ( Subcutaneous See Alternative 2021/10/01 1629)    Or  glycopyrrolate (ROBINUL) injection 0.2 mg (0.2 mg Intravenous Given 2021/10/01 1629)  morphine (PF) 2 MG/ML injection 1 mg (1 mg Intravenous Given 2021/10/01 1617)  ondansetron (ZOFRAN-ODT) disintegrating tablet 4 mg ( Oral See Alternative 2021/10/01 1617)    Or  ondansetron (ZOFRAN) injection 4 mg (4 mg Intravenous Given 2021/10/01 1617)  methylPREDNISolone sodium succinate (SOLU-MEDROL) 125 mg/2 mL injection 125 mg (125 mg Intravenous Given 2021/10/01 1448)  ipratropium (ATROVENT) nebulizer solution 0.5 mg (0.5 mg Nebulization Given  2021/10/01 1455)  lactated ringers bolus 1,000 mL (1,000 mLs Intravenous New Bag/Given 2021/10/01 1455)  albuterol (PROVENTIL) (2.5 MG/3ML) 0.083% nebulizer solution 10 mg (10 mg Nebulization Given 2021/10/01 1454)    ED Course/ Medical Decision Making/ A&P  This patient presents to the ED for concern of respiratory distress, this involves an extensive number of treatment options, and is a complaint that carries with it a high risk of complications and morbidity.  The differential diagnosis includes pneumonia, intrinsic pulmonary decompensation, atypical ACS, sepsis, failure to thrive   Co morbidities that complicate the patient evaluation  Obesity, chronic oxygen dependency, recent admission for sepsis   Social Determinants of Health:  Age, encephalopathy   Additional history obtained:  Additional history and/or information obtained from as above, family, EMS, chart review External records from outside source obtained and reviewed including as above, recent admission for sepsis, nursing home discovered the patient today unresponsive   After the initial evaluation, orders, including: Broad-spectrum antibiotics, nonrebreather mask, continuous bronchodilator, monitoring were initiated.  Patient placed on Cardiac and Pulse-Oximetry Monitors. The patient was maintained on a cardiac monitor.  The cardiac monitored showed an rhythm of A-fib 80 unremarkable otherwise The patient was also maintained on pulse oximetry. The readings were typically 92% with 15 L via nasal cannula/nonrebreather mask, abnormal 4:52 PM With multiple family members at bedside as well as chaplain we discussed goals of care.  Patient's MOST form was reviewed on her arrival, she was not a candidate for intubation per paperwork.  Family in agreement for comfort care at this point, notes that the patient has previously stated she did not want to be on ventilator, was ready to die.  Patient will transition from antibiotics,  nonrebreather mask to nasal cannula, morphine, Ativan, palliative meds as needed.  Consult has been placed.                            Lab Tests:  I personally interpreted labs.  The pertinent results include: Lactic acidosis, leukocytosis, COVID-negative, influenza negative  Imaging Studies ordered:  I independently visualized and interpreted imaging which showed persistent bilateral pneumonia I agree with the radiologist interpretation  Consultations Obtained:  I requested consultation with the palliative care team,  and discussed lab and imaging findings as well as pertinent plan - they recommend: Ongoing palliative efforts  Dispostion / Final MDM:  After consideration of the diagnostic results and the patient's response to treatment, patient was initially receiving broad-spectrum antibiotics, fluids, noninvasive warming for resuscitation due to pneumonia/sepsis.  However, after several hours of resuscitation, after lengthy family consultation, patient transition to comfort care.  5:19 PM Patient deceased.  Adult female with recent hospitalization for sepsis, pneumonia, now presents in respiratory distress, listless.  Patient's recent hospitalization, comorbidities, encephalopathy all could be due to poor prognosis, and after initial resuscitation did had some stabilization of her oxygenation status, without substantial change in mentation, patient was transitioned to comfort care, as above.  Final Clinical Impression(s) / ED Diagnoses Final diagnoses:  Respiratory arrest (HCC)    CRITICAL CARE Performed by: Gerhard Munch Total critical care time: 35 minutes Critical care time was exclusive of separately billable procedures and treating other patients. Critical care was necessary to treat or prevent imminent or life-threatening deterioration. Critical care was time spent personally by me on the following activities: development of treatment plan with patient and/or surrogate  as well as nursing, discussions with consultants, evaluation of patient's response to treatment, examination of patient, obtaining history from patient or surrogate, ordering and performing treatments and interventions, ordering and review of laboratory studies, ordering and review of radiographic studies, pulse oximetry and re-evaluation of patient's condition.    Gerhard Munch, MD November 14, 2021 1721

## 2021-11-04 NOTE — ED Notes (Signed)
MD Jeraldine Loots at bedside to speak with family regarding plan of care.

## 2021-11-04 DEATH — deceased

## 2021-11-05 LAB — CULTURE, BLOOD (ROUTINE X 2)
Culture: NO GROWTH
Special Requests: ADEQUATE
